# Patient Record
Sex: Male | Born: 1955 | Race: White | Hispanic: No | Marital: Single | Smoking: Current every day smoker
Health system: Southern US, Community
[De-identification: ages and names within clinical notes are randomized; demographics above are authoritative.]

## PROBLEM LIST (undated history)

## (undated) DIAGNOSIS — E039 Hypothyroidism, unspecified: Secondary | ICD-10-CM

## (undated) DIAGNOSIS — M797 Fibromyalgia: Secondary | ICD-10-CM

## (undated) DIAGNOSIS — K219 Gastro-esophageal reflux disease without esophagitis: Secondary | ICD-10-CM

## (undated) DIAGNOSIS — I10 Essential (primary) hypertension: Secondary | ICD-10-CM

## (undated) DIAGNOSIS — K859 Acute pancreatitis without necrosis or infection, unspecified: Secondary | ICD-10-CM

## (undated) DIAGNOSIS — J449 Chronic obstructive pulmonary disease, unspecified: Secondary | ICD-10-CM

## (undated) DIAGNOSIS — I878 Other specified disorders of veins: Secondary | ICD-10-CM

## (undated) DIAGNOSIS — M1990 Unspecified osteoarthritis, unspecified site: Secondary | ICD-10-CM

## (undated) DIAGNOSIS — I480 Paroxysmal atrial fibrillation: Secondary | ICD-10-CM

## (undated) DIAGNOSIS — N182 Chronic kidney disease, stage 2 (mild): Secondary | ICD-10-CM

## (undated) DIAGNOSIS — C439 Malignant melanoma of skin, unspecified: Secondary | ICD-10-CM

## (undated) DIAGNOSIS — F319 Bipolar disorder, unspecified: Secondary | ICD-10-CM

## (undated) DIAGNOSIS — F419 Anxiety disorder, unspecified: Secondary | ICD-10-CM

## (undated) DIAGNOSIS — J45909 Unspecified asthma, uncomplicated: Secondary | ICD-10-CM

## (undated) DIAGNOSIS — E785 Hyperlipidemia, unspecified: Secondary | ICD-10-CM

## (undated) DIAGNOSIS — B1920 Unspecified viral hepatitis C without hepatic coma: Secondary | ICD-10-CM

## (undated) DIAGNOSIS — G8929 Other chronic pain: Secondary | ICD-10-CM

## (undated) DIAGNOSIS — M069 Rheumatoid arthritis, unspecified: Secondary | ICD-10-CM

## (undated) HISTORY — PX: SKIN CANCER EXCISION: SHX779

## (undated) HISTORY — PX: INGUINAL HERNIA REPAIR: SUR1180

## (undated) HISTORY — DX: Essential (primary) hypertension: I10

## (undated) HISTORY — PX: KNEE SURGERY: SHX244

## (undated) HISTORY — DX: Hyperlipidemia, unspecified: E78.5

## (undated) HISTORY — PX: HERNIA REPAIR: SHX51

## (undated) HISTORY — DX: Paroxysmal atrial fibrillation: I48.0

## (undated) HISTORY — DX: Hypothyroidism, unspecified: E03.9

## (undated) HISTORY — DX: Unspecified viral hepatitis C without hepatic coma: B19.20

## (undated) HISTORY — DX: Chronic obstructive pulmonary disease, unspecified: J44.9

## (undated) HISTORY — DX: Rheumatoid arthritis, unspecified: M06.9

## (undated) HISTORY — PX: THYROIDECTOMY: SHX17

## (undated) HISTORY — PX: FRACTURE SURGERY: SHX138

## (undated) HISTORY — DX: Unspecified osteoarthritis, unspecified site: M19.90

## (undated) HISTORY — PX: TONSILLECTOMY: SUR1361

## (undated) HISTORY — DX: Other specified disorders of veins: I87.8

## (undated) HISTORY — DX: Other chronic pain: G89.29

## (undated) HISTORY — DX: Malignant melanoma of skin, unspecified: C43.9

---

## 1998-06-22 ENCOUNTER — Emergency Department (HOSPITAL_COMMUNITY): Admission: EM | Admit: 1998-06-22 | Discharge: 1998-06-23 | Payer: Self-pay | Admitting: Emergency Medicine

## 1998-09-30 ENCOUNTER — Emergency Department (HOSPITAL_COMMUNITY): Admission: EM | Admit: 1998-09-30 | Discharge: 1998-09-30 | Payer: Self-pay | Admitting: Internal Medicine

## 1998-10-25 ENCOUNTER — Emergency Department (HOSPITAL_COMMUNITY): Admission: EM | Admit: 1998-10-25 | Discharge: 1998-10-25 | Payer: Self-pay | Admitting: Emergency Medicine

## 1998-10-25 ENCOUNTER — Encounter: Payer: Self-pay | Admitting: Emergency Medicine

## 1998-11-12 ENCOUNTER — Ambulatory Visit (HOSPITAL_BASED_OUTPATIENT_CLINIC_OR_DEPARTMENT_OTHER): Admission: RE | Admit: 1998-11-12 | Discharge: 1998-11-12 | Payer: Self-pay | Admitting: Orthopedic Surgery

## 2001-08-08 ENCOUNTER — Emergency Department (HOSPITAL_COMMUNITY): Admission: EM | Admit: 2001-08-08 | Discharge: 2001-08-08 | Payer: Self-pay | Admitting: *Deleted

## 2004-08-28 ENCOUNTER — Emergency Department (HOSPITAL_COMMUNITY): Admission: EM | Admit: 2004-08-28 | Discharge: 2004-08-28 | Payer: Self-pay | Admitting: Emergency Medicine

## 2004-08-31 ENCOUNTER — Ambulatory Visit: Payer: Self-pay | Admitting: Internal Medicine

## 2004-09-01 ENCOUNTER — Ambulatory Visit: Payer: Self-pay | Admitting: *Deleted

## 2004-10-01 ENCOUNTER — Ambulatory Visit: Payer: Self-pay | Admitting: Internal Medicine

## 2004-11-08 ENCOUNTER — Emergency Department (HOSPITAL_COMMUNITY): Admission: EM | Admit: 2004-11-08 | Discharge: 2004-11-08 | Payer: Self-pay | Admitting: *Deleted

## 2005-02-07 ENCOUNTER — Ambulatory Visit: Payer: Self-pay | Admitting: Internal Medicine

## 2005-03-05 ENCOUNTER — Emergency Department (HOSPITAL_COMMUNITY): Admission: EM | Admit: 2005-03-05 | Discharge: 2005-03-05 | Payer: Self-pay | Admitting: Emergency Medicine

## 2005-03-14 ENCOUNTER — Ambulatory Visit: Payer: Self-pay | Admitting: Internal Medicine

## 2006-10-08 ENCOUNTER — Emergency Department (HOSPITAL_COMMUNITY): Admission: EM | Admit: 2006-10-08 | Discharge: 2006-10-08 | Payer: Self-pay | Admitting: Emergency Medicine

## 2006-12-15 ENCOUNTER — Ambulatory Visit: Payer: Self-pay | Admitting: Internal Medicine

## 2006-12-19 ENCOUNTER — Ambulatory Visit: Payer: Self-pay | Admitting: Internal Medicine

## 2006-12-22 ENCOUNTER — Ambulatory Visit: Payer: Self-pay | Admitting: Internal Medicine

## 2006-12-28 ENCOUNTER — Ambulatory Visit: Payer: Self-pay | Admitting: Internal Medicine

## 2006-12-31 ENCOUNTER — Ambulatory Visit: Payer: Self-pay | Admitting: Internal Medicine

## 2006-12-31 ENCOUNTER — Inpatient Hospital Stay (HOSPITAL_COMMUNITY): Admission: EM | Admit: 2006-12-31 | Discharge: 2007-01-01 | Payer: Self-pay | Admitting: Emergency Medicine

## 2007-01-01 ENCOUNTER — Encounter (INDEPENDENT_AMBULATORY_CARE_PROVIDER_SITE_OTHER): Payer: Self-pay | Admitting: Internal Medicine

## 2007-01-09 ENCOUNTER — Ambulatory Visit: Payer: Self-pay | Admitting: Internal Medicine

## 2007-01-16 ENCOUNTER — Ambulatory Visit: Payer: Self-pay | Admitting: Internal Medicine

## 2007-01-17 ENCOUNTER — Ambulatory Visit (HOSPITAL_COMMUNITY): Admission: RE | Admit: 2007-01-17 | Discharge: 2007-01-17 | Payer: Self-pay | Admitting: Internal Medicine

## 2007-01-17 ENCOUNTER — Emergency Department (HOSPITAL_COMMUNITY): Admission: EM | Admit: 2007-01-17 | Discharge: 2007-01-17 | Payer: Self-pay | Admitting: Emergency Medicine

## 2007-01-18 ENCOUNTER — Encounter (HOSPITAL_COMMUNITY): Admission: RE | Admit: 2007-01-18 | Discharge: 2007-01-21 | Payer: Self-pay | Admitting: Interventional Cardiology

## 2007-01-26 ENCOUNTER — Ambulatory Visit (HOSPITAL_COMMUNITY): Admission: RE | Admit: 2007-01-26 | Discharge: 2007-01-26 | Payer: Self-pay | Admitting: Orthopedic Surgery

## 2007-02-01 ENCOUNTER — Ambulatory Visit: Payer: Self-pay | Admitting: Internal Medicine

## 2007-02-05 ENCOUNTER — Ambulatory Visit: Payer: Self-pay | Admitting: Internal Medicine

## 2007-02-06 ENCOUNTER — Ambulatory Visit: Payer: Self-pay | Admitting: Vascular Surgery

## 2007-02-06 ENCOUNTER — Emergency Department (HOSPITAL_COMMUNITY): Admission: EM | Admit: 2007-02-06 | Discharge: 2007-02-06 | Payer: Self-pay | Admitting: Emergency Medicine

## 2007-02-22 ENCOUNTER — Ambulatory Visit: Payer: Self-pay | Admitting: Internal Medicine

## 2007-03-02 ENCOUNTER — Emergency Department (HOSPITAL_COMMUNITY): Admission: EM | Admit: 2007-03-02 | Discharge: 2007-03-02 | Payer: Self-pay | Admitting: Emergency Medicine

## 2007-03-14 ENCOUNTER — Encounter (INDEPENDENT_AMBULATORY_CARE_PROVIDER_SITE_OTHER): Payer: Self-pay | Admitting: *Deleted

## 2007-05-18 ENCOUNTER — Ambulatory Visit: Payer: Self-pay | Admitting: Internal Medicine

## 2007-06-07 ENCOUNTER — Ambulatory Visit: Payer: Self-pay | Admitting: Internal Medicine

## 2007-06-15 ENCOUNTER — Ambulatory Visit (HOSPITAL_COMMUNITY): Admission: RE | Admit: 2007-06-15 | Discharge: 2007-06-15 | Payer: Self-pay | Admitting: Internal Medicine

## 2007-07-19 ENCOUNTER — Ambulatory Visit: Payer: Self-pay | Admitting: Internal Medicine

## 2007-07-19 LAB — CONVERTED CEMR LAB
Amphetamine Screen, Ur: NEGATIVE
Benzodiazepines.: NEGATIVE
Cocaine Metabolites: NEGATIVE
Creatinine,U: 133.3 mg/dL
Marijuana Metabolite: NEGATIVE
Methadone: NEGATIVE
Opiate Screen, Urine: NEGATIVE
TSH: 0.974 microintl units/mL (ref 0.350–5.50)

## 2007-09-20 ENCOUNTER — Ambulatory Visit: Payer: Self-pay | Admitting: Internal Medicine

## 2007-09-20 LAB — CONVERTED CEMR LAB
Benzodiazepines.: NEGATIVE
Marijuana Metabolite: NEGATIVE
Phencyclidine (PCP): NEGATIVE

## 2008-12-17 ENCOUNTER — Encounter: Payer: Self-pay | Admitting: Internal Medicine

## 2008-12-17 ENCOUNTER — Emergency Department (HOSPITAL_COMMUNITY): Admission: EM | Admit: 2008-12-17 | Discharge: 2008-12-17 | Payer: Self-pay | Admitting: Emergency Medicine

## 2009-01-19 ENCOUNTER — Emergency Department (HOSPITAL_COMMUNITY): Admission: EM | Admit: 2009-01-19 | Discharge: 2009-01-19 | Payer: Self-pay | Admitting: Emergency Medicine

## 2009-05-15 ENCOUNTER — Encounter: Admission: RE | Admit: 2009-05-15 | Discharge: 2009-05-15 | Payer: Self-pay | Admitting: Internal Medicine

## 2009-11-08 ENCOUNTER — Emergency Department (HOSPITAL_COMMUNITY): Admission: EM | Admit: 2009-11-08 | Discharge: 2009-11-08 | Payer: Self-pay | Admitting: Emergency Medicine

## 2009-12-12 ENCOUNTER — Emergency Department (HOSPITAL_COMMUNITY): Admission: EM | Admit: 2009-12-12 | Discharge: 2009-12-13 | Payer: Self-pay | Admitting: Emergency Medicine

## 2010-01-07 ENCOUNTER — Emergency Department (HOSPITAL_COMMUNITY): Admission: EM | Admit: 2010-01-07 | Discharge: 2010-01-07 | Payer: Self-pay | Admitting: Emergency Medicine

## 2010-01-23 ENCOUNTER — Emergency Department (HOSPITAL_COMMUNITY): Admission: EM | Admit: 2010-01-23 | Discharge: 2010-01-23 | Payer: Self-pay | Admitting: Emergency Medicine

## 2010-02-18 ENCOUNTER — Observation Stay (HOSPITAL_COMMUNITY): Admission: EM | Admit: 2010-02-18 | Discharge: 2010-02-19 | Payer: Self-pay | Admitting: Emergency Medicine

## 2010-02-18 ENCOUNTER — Ambulatory Visit: Payer: Self-pay | Admitting: Family Medicine

## 2010-02-18 ENCOUNTER — Ambulatory Visit: Payer: Self-pay | Admitting: Internal Medicine

## 2010-02-19 ENCOUNTER — Encounter: Payer: Self-pay | Admitting: Family Medicine

## 2010-02-19 HISTORY — PX: US ECHOCARDIOGRAPHY: HXRAD669

## 2010-02-22 ENCOUNTER — Ambulatory Visit: Payer: Self-pay | Admitting: Cardiovascular Disease

## 2010-03-03 ENCOUNTER — Ambulatory Visit: Payer: Self-pay | Admitting: Cardiovascular Disease

## 2010-03-10 ENCOUNTER — Ambulatory Visit: Payer: Self-pay | Admitting: Cardiovascular Disease

## 2010-03-18 ENCOUNTER — Ambulatory Visit: Payer: Self-pay | Admitting: Cardiology

## 2010-03-26 ENCOUNTER — Ambulatory Visit: Payer: Self-pay | Admitting: Cardiovascular Disease

## 2010-04-02 ENCOUNTER — Ambulatory Visit: Payer: Self-pay | Admitting: Cardiovascular Disease

## 2010-04-08 ENCOUNTER — Ambulatory Visit: Payer: Self-pay | Admitting: Cardiology

## 2010-04-13 ENCOUNTER — Encounter: Admission: RE | Admit: 2010-04-13 | Discharge: 2010-04-13 | Payer: Self-pay | Admitting: Family Medicine

## 2010-04-17 ENCOUNTER — Ambulatory Visit: Payer: Self-pay | Admitting: Vascular Surgery

## 2010-04-17 ENCOUNTER — Emergency Department (HOSPITAL_COMMUNITY): Admission: EM | Admit: 2010-04-17 | Discharge: 2010-04-17 | Payer: Self-pay | Admitting: Emergency Medicine

## 2010-04-19 ENCOUNTER — Ambulatory Visit: Payer: Self-pay | Admitting: Cardiology

## 2010-04-26 ENCOUNTER — Ambulatory Visit: Payer: Self-pay | Admitting: Cardiology

## 2010-05-03 ENCOUNTER — Ambulatory Visit: Payer: Self-pay | Admitting: Cardiovascular Disease

## 2010-05-25 ENCOUNTER — Ambulatory Visit: Payer: Self-pay | Admitting: Cardiology

## 2010-06-22 ENCOUNTER — Ambulatory Visit: Payer: Self-pay | Admitting: Cardiovascular Disease

## 2010-07-15 ENCOUNTER — Ambulatory Visit: Payer: Self-pay | Admitting: Cardiovascular Disease

## 2010-07-18 ENCOUNTER — Encounter: Payer: Self-pay | Admitting: Rheumatology

## 2010-07-18 ENCOUNTER — Encounter: Payer: Self-pay | Admitting: Interventional Cardiology

## 2010-07-21 ENCOUNTER — Emergency Department (HOSPITAL_COMMUNITY)
Admission: EM | Admit: 2010-07-21 | Discharge: 2010-07-21 | Payer: Self-pay | Source: Home / Self Care | Admitting: Emergency Medicine

## 2010-08-13 ENCOUNTER — Emergency Department (HOSPITAL_COMMUNITY)
Admission: EM | Admit: 2010-08-13 | Discharge: 2010-08-13 | Disposition: A | Discharge status: Home / Self Care | Payer: Medicaid Other | Attending: Emergency Medicine | Admitting: Emergency Medicine

## 2010-08-13 DIAGNOSIS — IMO0001 Reserved for inherently not codable concepts without codable children: Secondary | ICD-10-CM | POA: Insufficient documentation

## 2010-08-13 DIAGNOSIS — Z79899 Other long term (current) drug therapy: Secondary | ICD-10-CM | POA: Insufficient documentation

## 2010-08-13 DIAGNOSIS — I872 Venous insufficiency (chronic) (peripheral): Secondary | ICD-10-CM | POA: Insufficient documentation

## 2010-08-13 DIAGNOSIS — E039 Hypothyroidism, unspecified: Secondary | ICD-10-CM | POA: Insufficient documentation

## 2010-08-13 DIAGNOSIS — E785 Hyperlipidemia, unspecified: Secondary | ICD-10-CM | POA: Insufficient documentation

## 2010-08-13 DIAGNOSIS — M129 Arthropathy, unspecified: Secondary | ICD-10-CM | POA: Insufficient documentation

## 2010-08-13 DIAGNOSIS — M069 Rheumatoid arthritis, unspecified: Secondary | ICD-10-CM | POA: Insufficient documentation

## 2010-08-13 DIAGNOSIS — Z8619 Personal history of other infectious and parasitic diseases: Secondary | ICD-10-CM | POA: Insufficient documentation

## 2010-08-13 DIAGNOSIS — I4891 Unspecified atrial fibrillation: Secondary | ICD-10-CM | POA: Insufficient documentation

## 2010-08-13 DIAGNOSIS — J45909 Unspecified asthma, uncomplicated: Secondary | ICD-10-CM | POA: Insufficient documentation

## 2010-08-13 DIAGNOSIS — E119 Type 2 diabetes mellitus without complications: Secondary | ICD-10-CM | POA: Insufficient documentation

## 2010-08-13 LAB — CBC
MCH: 30.9 pg (ref 26.0–34.0)
MCV: 90.4 fL (ref 78.0–100.0)
RBC: 4.47 MIL/uL (ref 4.22–5.81)
WBC: 7.3 10*3/uL (ref 4.0–10.5)

## 2010-08-13 LAB — DIFFERENTIAL
Basophils Absolute: 0 10*3/uL (ref 0.0–0.1)
Basophils Relative: 0 % (ref 0–1)
Eosinophils Absolute: 0.4 10*3/uL (ref 0.0–0.7)
Lymphocytes Relative: 37 % (ref 12–46)
Monocytes Relative: 12 % (ref 3–12)
Neutro Abs: 3.4 10*3/uL (ref 1.7–7.7)
Neutrophils Relative %: 46 % (ref 43–77)

## 2010-08-13 LAB — BASIC METABOLIC PANEL
BUN: 21 mg/dL (ref 6–23)
Calcium: 8.8 mg/dL (ref 8.4–10.5)
GFR calc non Af Amer: >60 mL/min (ref >60)
Sodium: 138 mEq/L (ref 135–145)

## 2010-09-08 LAB — CBC
MCV: 89.8 fL (ref 78.0–100.0)
Platelets: 168 10*3/uL (ref 150–400)
RBC: 4.43 MIL/uL (ref 4.22–5.81)
RDW: 14 % (ref 11.5–15.5)
WBC: 8.8 10*3/uL (ref 4.0–10.5)

## 2010-09-08 LAB — POCT I-STAT, CHEM 8
Creatinine, Ser: 1.1 mg/dL (ref 0.4–1.5)
HCT: 42 % (ref 39.0–52.0)
Hemoglobin: 14.3 g/dL (ref 13.0–17.0)
Potassium: 4.1 mEq/L (ref 3.5–5.1)
Sodium: 138 mEq/L (ref 135–145)
TCO2: 26 mmol/L (ref 0–100)

## 2010-09-08 LAB — APTT: aPTT: 36 seconds (ref 24–37)

## 2010-09-08 LAB — PROTIME-INR
INR: 1.95 — ABNORMAL HIGH (ref 0.00–1.49)
Prothrombin Time: 22.4 seconds — ABNORMAL HIGH (ref 11.6–15.2)

## 2010-09-08 LAB — DIFFERENTIAL
Basophils Absolute: 0 10*3/uL (ref 0.0–0.1)
Lymphocytes Relative: 37 % (ref 12–46)
Lymphs Abs: 3.2 10*3/uL (ref 0.7–4.0)
Neutro Abs: 4.1 10*3/uL (ref 1.7–7.7)
Neutrophils Relative %: 46 % (ref 43–77)

## 2010-09-10 LAB — GLUCOSE, CAPILLARY
Glucose-Capillary: 122 mg/dL — ABNORMAL HIGH (ref 70–99)
Glucose-Capillary: 149 mg/dL — ABNORMAL HIGH (ref 70–99)

## 2010-09-10 LAB — DIFFERENTIAL
Basophils Absolute: 0 10*3/uL (ref 0.0–0.1)
Basophils Relative: 0 % (ref 0–1)
Lymphocytes Relative: 50 % — ABNORMAL HIGH (ref 12–46)
Monocytes Relative: 8 % (ref 3–12)
Neutro Abs: 2.9 10*3/uL (ref 1.7–7.7)
Neutrophils Relative %: 37 % — ABNORMAL LOW (ref 43–77)

## 2010-09-10 LAB — BASIC METABOLIC PANEL
CO2: 25 mEq/L (ref 19–32)
Calcium: 9.2 mg/dL (ref 8.4–10.5)
Chloride: 109 mEq/L (ref 96–112)
GFR calc Af Amer: >60 mL/min (ref >60)
GFR calc non Af Amer: >60 mL/min (ref >60)
GFR calc non Af Amer: >60 mL/min (ref >60)
Glucose, Bld: 107 mg/dL — ABNORMAL HIGH (ref 70–99)
Glucose, Bld: 157 mg/dL — ABNORMAL HIGH (ref 70–99)
Potassium: 3.8 mEq/L (ref 3.5–5.1)
Potassium: 3.9 mEq/L (ref 3.5–5.1)
Sodium: 139 mEq/L (ref 135–145)
Sodium: 140 mEq/L (ref 135–145)

## 2010-09-10 LAB — LIPID PANEL
HDL: 28 mg/dL — ABNORMAL LOW (ref >39)
Total CHOL/HDL Ratio: 4.1 RATIO
Triglycerides: 266 mg/dL — ABNORMAL HIGH (ref <150)
VLDL: 53 mg/dL — ABNORMAL HIGH (ref 0–40)

## 2010-09-10 LAB — CBC
Hemoglobin: 15.6 g/dL (ref 13.0–17.0)
MCH: 30.1 pg (ref 26.0–34.0)
MCHC: 34.2 g/dL (ref 30.0–36.0)
MCHC: 35.4 g/dL (ref 30.0–36.0)
MCV: 88 fL (ref 78.0–100.0)
Platelets: 166 10*3/uL (ref 150–400)
RDW: 14.3 % (ref 11.5–15.5)
RDW: 14.3 % (ref 11.5–15.5)
WBC: 7.8 10*3/uL (ref 4.0–10.5)

## 2010-09-10 LAB — POCT CARDIAC MARKERS
CKMB, poc: <1 ng/mL — ABNORMAL LOW (ref 1.0–8.0)
Myoglobin, poc: 40.9 ng/mL (ref 12–200)
Troponin i, poc: <0.05 ng/mL (ref 0.00–0.09)

## 2010-09-10 LAB — TROPONIN I: Troponin I: 0.01 ng/mL (ref 0.00–0.06)

## 2010-09-10 LAB — CARDIAC PANEL(CRET KIN+CKTOT+MB+TROPI)
Relative Index: INVALID (ref 0.0–2.5)
Troponin I: 0.03 ng/mL (ref 0.00–0.06)

## 2010-09-10 LAB — CK TOTAL AND CKMB (NOT AT ARMC)
CK, MB: 1.3 ng/mL (ref 0.3–4.0)
Relative Index: INVALID (ref 0.0–2.5)

## 2010-09-10 LAB — RAPID URINE DRUG SCREEN, HOSP PERFORMED: Tetrahydrocannabinol: NOT DETECTED

## 2010-09-11 LAB — CBC
Hemoglobin: 14.2 g/dL (ref 13.0–17.0)
MCH: 31.6 pg (ref 26.0–34.0)
MCV: 91 fL (ref 78.0–100.0)
Platelets: 186 10*3/uL (ref 150–400)
RBC: 4.5 MIL/uL (ref 4.22–5.81)

## 2010-09-11 LAB — DIFFERENTIAL
Eosinophils Absolute: 0.4 10*3/uL (ref 0.0–0.7)
Eosinophils Relative: 4 % (ref 0–5)
Lymphs Abs: 3.3 10*3/uL (ref 0.7–4.0)
Monocytes Relative: 8 % (ref 3–12)

## 2010-09-11 LAB — POCT I-STAT, CHEM 8
BUN: 15 mg/dL (ref 6–23)
Calcium, Ion: 1.12 mmol/L (ref 1.12–1.32)
Creatinine, Ser: 1.3 mg/dL (ref 0.4–1.5)
Glucose, Bld: 103 mg/dL — ABNORMAL HIGH (ref 70–99)
Hemoglobin: 14.6 g/dL (ref 13.0–17.0)
TCO2: 25 mmol/L (ref 0–100)

## 2010-09-13 LAB — DIFFERENTIAL
Basophils Relative: 0 % (ref 0–1)
Eosinophils Absolute: 0.2 10*3/uL (ref 0.0–0.7)
Monocytes Relative: 8 % (ref 3–12)
Neutro Abs: 6.7 10*3/uL (ref 1.7–7.7)
Neutrophils Relative %: 66 % (ref 43–77)

## 2010-09-13 LAB — COMPREHENSIVE METABOLIC PANEL
ALT: 21 U/L (ref 0–53)
Alkaline Phosphatase: 50 U/L (ref 39–117)
BUN: 12 mg/dL (ref 6–23)
CO2: 28 mEq/L (ref 19–32)
Calcium: 9.1 mg/dL (ref 8.4–10.5)
GFR calc non Af Amer: >60 mL/min (ref >60)
Glucose, Bld: 164 mg/dL — ABNORMAL HIGH (ref 70–99)
Potassium: 4.8 mEq/L (ref 3.5–5.1)
Total Protein: 7.1 g/dL (ref 6.0–8.3)

## 2010-09-13 LAB — CBC
HCT: 42.3 % (ref 39.0–52.0)
Hemoglobin: 14.5 g/dL (ref 13.0–17.0)
MCHC: 34.4 g/dL (ref 30.0–36.0)
RBC: 4.68 MIL/uL (ref 4.22–5.81)
RDW: 13.8 % (ref 11.5–15.5)

## 2010-09-22 ENCOUNTER — Other Ambulatory Visit: Payer: Self-pay | Admitting: *Deleted

## 2010-09-22 DIAGNOSIS — I4891 Unspecified atrial fibrillation: Secondary | ICD-10-CM

## 2010-09-22 MED ORDER — WARFARIN SODIUM 5 MG PO TABS: ORAL_TABLET | ORAL | Status: DC

## 2010-09-22 NOTE — Telephone Encounter (Signed)
Refill per phar. fax
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
## 2011-01-27 ENCOUNTER — Ambulatory Visit: Payer: Medicaid Other | Admitting: Cardiovascular Disease

## 2011-01-31 ENCOUNTER — Telehealth: Payer: Self-pay | Admitting: Cardiovascular Disease

## 2011-01-31 NOTE — Telephone Encounter (Signed)
Morrie Sheldon called from Dr Ricci Barker office She said that the info did not get to his transportation and to him to make appt last Thursday and she wants to reschedule please call

## 2011-02-16 ENCOUNTER — Ambulatory Visit: Payer: Medicaid Other | Admitting: Cardiovascular Disease

## 2011-03-31 ENCOUNTER — Encounter: Payer: Self-pay | Admitting: Cardiovascular Disease

## 2011-03-31 ENCOUNTER — Ambulatory Visit: Payer: Medicaid Other | Admitting: Cardiovascular Disease

## 2011-04-05 ENCOUNTER — Encounter: Payer: Self-pay | Admitting: Cardiovascular Disease

## 2011-04-11 LAB — COMPREHENSIVE METABOLIC PANEL
ALT: 20
AST: 16
Alkaline Phosphatase: 53
CO2: 24
Calcium: 9.4
Chloride: 106
GFR calc Af Amer: >60
GFR calc non Af Amer: >60
Glucose, Bld: 138 — ABNORMAL HIGH
Potassium: 3.5
Sodium: 135
Total Bilirubin: 0.7

## 2011-04-11 LAB — CBC
Hemoglobin: 14.4
MCHC: 35.1
RBC: 4.78
WBC: 9.2

## 2011-04-11 LAB — DIFFERENTIAL
Basophils Absolute: 0
Basophils Relative: 0
Eosinophils Absolute: 0.2
Eosinophils Relative: 3
Lymphs Abs: 3
Neutrophils Relative %: 53

## 2011-04-12 LAB — MAGNESIUM
Magnesium: 1.9
Magnesium: 1.9

## 2011-04-12 LAB — CARDIAC PANEL(CRET KIN+CKTOT+MB+TROPI)
CK, MB: 1.3
CK, MB: 1.6
Relative Index: INVALID
Total CK: 70
Total CK: 84
Troponin I: 0.01

## 2011-04-12 LAB — URINE CULTURE

## 2011-04-12 LAB — I-STAT 8, (EC8 V) (CONVERTED LAB)
BUN: 15
Bicarbonate: 23.1
Chloride: 105
Glucose, Bld: 149 — ABNORMAL HIGH
HCT: 40
Hemoglobin: 13.6
Operator id: 257131
pCO2, Ven: 38.9 — ABNORMAL LOW

## 2011-04-12 LAB — CULTURE, BLOOD (ROUTINE X 2): Culture: NO GROWTH

## 2011-04-12 LAB — DIFFERENTIAL
Basophils Absolute: 0
Basophils Relative: 0
Lymphocytes Relative: 39
Neutro Abs: 4.8
Neutrophils Relative %: 53

## 2011-04-12 LAB — BASIC METABOLIC PANEL
BUN: 12
CO2: 24
Calcium: 8.6
Calcium: 8.6
Creatinine, Ser: 0.77
GFR calc Af Amer: >60
GFR calc non Af Amer: >60
GFR calc non Af Amer: >60
Glucose, Bld: 268 — ABNORMAL HIGH
Sodium: 136
Sodium: 139

## 2011-04-12 LAB — CBC
HCT: 37.7 — ABNORMAL LOW
HCT: 41.1
Hemoglobin: 12.9 — ABNORMAL LOW
Hemoglobin: 13
Hemoglobin: 13.9
MCHC: 33.8
Platelets: 210
RBC: 4.3
RBC: 4.32
RDW: 13.6
WBC: 7.8

## 2011-04-12 LAB — PROTIME-INR
INR: 0.9
Prothrombin Time: 12.7

## 2011-04-12 LAB — LIPID PANEL
Cholesterol: 165
HDL: 29 — ABNORMAL LOW
Total CHOL/HDL Ratio: 5.7
VLDL: 26

## 2011-04-12 LAB — URINALYSIS, ROUTINE W REFLEX MICROSCOPIC
Glucose, UA: NEGATIVE
Hgb urine dipstick: NEGATIVE
Leukocytes, UA: NEGATIVE
pH: 6

## 2011-04-12 LAB — RAPID URINE DRUG SCREEN, HOSP PERFORMED
Benzodiazepines: NOT DETECTED
Cocaine: NOT DETECTED
Opiates: NOT DETECTED

## 2011-04-12 LAB — POCT I-STAT CREATININE
Creatinine, Ser: 0.7
Operator id: 257131

## 2011-04-12 LAB — COMPREHENSIVE METABOLIC PANEL
Albumin: 3.7
Alkaline Phosphatase: 64
BUN: 13
CO2: 27
Chloride: 103
Creatinine, Ser: 0.73
GFR calc non Af Amer: >60
Glucose, Bld: 128 — ABNORMAL HIGH
Potassium: 4
Total Bilirubin: 0.7

## 2011-04-12 LAB — ETHANOL: Alcohol, Ethyl (B): <5

## 2011-04-12 LAB — POCT CARDIAC MARKERS
CKMB, poc: <1 — ABNORMAL LOW
Myoglobin, poc: 38.4
Operator id: 151321
Operator id: 257131

## 2011-04-12 LAB — URINE MICROSCOPIC-ADD ON

## 2011-04-12 LAB — HEMOGLOBIN A1C: Hgb A1c MFr Bld: 9.8 — ABNORMAL HIGH

## 2011-04-12 LAB — CK TOTAL AND CKMB (NOT AT ARMC): Relative Index: 1.9

## 2011-04-16 ENCOUNTER — Other Ambulatory Visit: Payer: Self-pay | Admitting: Cardiology

## 2011-04-18 ENCOUNTER — Other Ambulatory Visit: Payer: Self-pay

## 2011-04-19 ENCOUNTER — Other Ambulatory Visit: Payer: Self-pay | Admitting: Cardiology

## 2011-05-11 ENCOUNTER — Other Ambulatory Visit: Payer: Self-pay | Admitting: Family Medicine

## 2011-05-11 ENCOUNTER — Ambulatory Visit
Admission: RE | Admit: 2011-05-11 | Discharge: 2011-05-11 | Disposition: A | Discharge status: Home / Self Care | Payer: Medicaid Other | Source: Ambulatory Visit | Attending: Family Medicine | Admitting: Family Medicine

## 2011-05-11 DIAGNOSIS — M25531 Pain in right wrist: Secondary | ICD-10-CM

## 2011-08-04 ENCOUNTER — Encounter: Payer: Medicaid Other | Attending: Physical Medicine & Rehabilitation

## 2011-08-04 ENCOUNTER — Ambulatory Visit: Payer: Medicaid Other | Admitting: Physical Medicine & Rehabilitation

## 2011-08-12 ENCOUNTER — Ambulatory Visit: Payer: Medicaid Other | Admitting: Physical Medicine & Rehabilitation

## 2011-08-15 ENCOUNTER — Other Ambulatory Visit: Payer: Self-pay | Admitting: *Deleted

## 2011-09-28 ENCOUNTER — Ambulatory Visit: Payer: Medicaid Other | Admitting: Nurse Practitioner

## 2012-05-01 ENCOUNTER — Emergency Department (HOSPITAL_COMMUNITY): Payer: Medicaid Other

## 2012-05-01 ENCOUNTER — Encounter (HOSPITAL_COMMUNITY): Payer: Self-pay | Admitting: *Deleted

## 2012-05-01 ENCOUNTER — Emergency Department (HOSPITAL_COMMUNITY)
Admission: EM | Admit: 2012-05-01 | Discharge: 2012-05-01 | Disposition: A | Discharge status: Home / Self Care | Payer: Medicaid Other | Attending: Emergency Medicine | Admitting: Emergency Medicine

## 2012-05-01 DIAGNOSIS — Z8679 Personal history of other diseases of the circulatory system: Secondary | ICD-10-CM | POA: Insufficient documentation

## 2012-05-01 DIAGNOSIS — J449 Chronic obstructive pulmonary disease, unspecified: Secondary | ICD-10-CM | POA: Insufficient documentation

## 2012-05-01 DIAGNOSIS — E119 Type 2 diabetes mellitus without complications: Secondary | ICD-10-CM | POA: Insufficient documentation

## 2012-05-01 DIAGNOSIS — J45909 Unspecified asthma, uncomplicated: Secondary | ICD-10-CM | POA: Insufficient documentation

## 2012-05-01 DIAGNOSIS — Z85828 Personal history of other malignant neoplasm of skin: Secondary | ICD-10-CM | POA: Insufficient documentation

## 2012-05-01 DIAGNOSIS — R109 Unspecified abdominal pain: Secondary | ICD-10-CM

## 2012-05-01 DIAGNOSIS — I10 Essential (primary) hypertension: Secondary | ICD-10-CM | POA: Insufficient documentation

## 2012-05-01 DIAGNOSIS — Z79899 Other long term (current) drug therapy: Secondary | ICD-10-CM | POA: Insufficient documentation

## 2012-05-01 DIAGNOSIS — J4489 Other specified chronic obstructive pulmonary disease: Secondary | ICD-10-CM | POA: Insufficient documentation

## 2012-05-01 DIAGNOSIS — F319 Bipolar disorder, unspecified: Secondary | ICD-10-CM | POA: Insufficient documentation

## 2012-05-01 DIAGNOSIS — E785 Hyperlipidemia, unspecified: Secondary | ICD-10-CM | POA: Insufficient documentation

## 2012-05-01 DIAGNOSIS — E039 Hypothyroidism, unspecified: Secondary | ICD-10-CM | POA: Insufficient documentation

## 2012-05-01 DIAGNOSIS — Z7901 Long term (current) use of anticoagulants: Secondary | ICD-10-CM | POA: Insufficient documentation

## 2012-05-01 DIAGNOSIS — I4891 Unspecified atrial fibrillation: Secondary | ICD-10-CM | POA: Insufficient documentation

## 2012-05-01 DIAGNOSIS — F17200 Nicotine dependence, unspecified, uncomplicated: Secondary | ICD-10-CM | POA: Insufficient documentation

## 2012-05-01 DIAGNOSIS — G8929 Other chronic pain: Secondary | ICD-10-CM | POA: Insufficient documentation

## 2012-05-01 DIAGNOSIS — R1013 Epigastric pain: Secondary | ICD-10-CM | POA: Insufficient documentation

## 2012-05-01 DIAGNOSIS — B1920 Unspecified viral hepatitis C without hepatic coma: Secondary | ICD-10-CM | POA: Insufficient documentation

## 2012-05-01 HISTORY — DX: Bipolar disorder, unspecified: F31.9

## 2012-05-01 LAB — COMPREHENSIVE METABOLIC PANEL
AST: 15 U/L (ref 0–37)
Alkaline Phosphatase: 57 U/L (ref 39–117)
CO2: 23 mEq/L (ref 19–32)
Chloride: 104 mEq/L (ref 96–112)
Creatinine, Ser: 0.89 mg/dL (ref 0.50–1.35)
GFR calc non Af Amer: >90 mL/min (ref >90)
Total Bilirubin: 0.2 mg/dL — ABNORMAL LOW (ref 0.3–1.2)

## 2012-05-01 LAB — PROTIME-INR: Prothrombin Time: 29.1 seconds — ABNORMAL HIGH (ref 11.6–15.2)

## 2012-05-01 LAB — CBC WITH DIFFERENTIAL/PLATELET
Basophils Absolute: 0 10*3/uL (ref 0.0–0.1)
HCT: 39 % (ref 39.0–52.0)
Hemoglobin: 13.2 g/dL (ref 13.0–17.0)
Lymphocytes Relative: 31 % (ref 12–46)
Monocytes Absolute: 0.8 10*3/uL (ref 0.1–1.0)
Monocytes Relative: 11 % (ref 3–12)
Neutro Abs: 4.2 10*3/uL (ref 1.7–7.7)
RDW: 14 % (ref 11.5–15.5)
WBC: 7.8 10*3/uL (ref 4.0–10.5)

## 2012-05-01 MED ORDER — ONDANSETRON HCL 4 MG/2ML IJ SOLN
4.0000 mg | Freq: Once | INTRAMUSCULAR | Status: AC
  Administered 2012-05-01: 4 mg via INTRAVENOUS
  Filled 2012-05-01: qty 2

## 2012-05-01 MED ORDER — MORPHINE SULFATE 4 MG/ML IJ SOLN
4.0000 mg | Freq: Once | INTRAMUSCULAR | Status: AC
  Administered 2012-05-01: 4 mg via INTRAVENOUS
  Filled 2012-05-01: qty 1

## 2012-05-01 MED ORDER — OXYCODONE-ACETAMINOPHEN 5-325 MG PO TABS: 1.0000 | ORAL_TABLET | ORAL | Status: DC | PRN

## 2012-05-01 MED ORDER — SODIUM CHLORIDE 0.9 % IV BOLUS (SEPSIS)
1000.0000 mL | Freq: Once | INTRAVENOUS | Status: AC
  Administered 2012-05-01: 1000 mL via INTRAVENOUS

## 2012-05-01 MED ORDER — SUCRALFATE 1 GM/10ML PO SUSP: 1.0000 g | Freq: Four times a day (QID) | ORAL | Status: DC

## 2012-05-01 MED ORDER — SUCRALFATE 1 G PO TABS
1.0000 g | ORAL_TABLET | Freq: Once | ORAL | Status: AC
  Administered 2012-05-01: 1 g via ORAL
  Filled 2012-05-01: qty 1

## 2012-05-01 MED ORDER — GI COCKTAIL ~~LOC~~
30.0000 mL | Freq: Once | ORAL | Status: AC
  Filled 2012-05-01: qty 30

## 2012-05-01 MED ORDER — PANTOPRAZOLE SODIUM 40 MG IV SOLR
40.0000 mg | Freq: Once | INTRAVENOUS | Status: AC
  Administered 2012-05-01: 40 mg via INTRAVENOUS
  Filled 2012-05-01: qty 40

## 2012-05-01 MED ORDER — ONDANSETRON HCL 4 MG PO TABS: 4.0000 mg | ORAL_TABLET | Freq: Four times a day (QID) | ORAL | Status: DC

## 2012-05-01 NOTE — ED Notes (Signed)
Per EMS:  Pt woke up about 0300 with epigastric pain, pt took an oxycodone prior to pt's arrival.  Pt became angry with EMS because he thought they were taking too long, he ripped off his EKG leads, got out of the truck and went back inside.  Pt has hx of bipolar.  Pt has no SOB, reports nausea.

## 2012-05-01 NOTE — ED Provider Notes (Signed)
History     CSN: [id number]  Arrival date & time 05/01/12  0449   First MD Initiated Contact with Patient 05/01/12 0503      No chief complaint on file.   (Consider location/radiation/quality/duration/timing/severity/associated sxs/prior treatment) HPI Pt presents with epigastric pain that woke him from sleep at 0300. No N/V, chest pain. No fever chills. No recent NSAID use. States he ate lasagne last night before bed. No blood or melena in stool.   Past Medical History  Diagnosis Date  . PAF (paroxysmal atrial fibrillation)   . Diabetes mellitus   . Hypothyroidism   . Hyperlipidemia   . Hypertension   . Chronic pain   . Melanoma   . OA (osteoarthritis)   . COPD (chronic obstructive pulmonary disease)   . Asthma   . RA (rheumatoid arthritis)   . Hepatitis C   . Venous stasis   . Bipolar 1 disorder     Past Surgical History  Procedure Date  . Hernia repair   . Thyroidectomy   . Tonsillectomy   . US echocardiography 02/19/2010    EF 55-60%    Family History  Problem Relation Age of Onset  . Hypertension Mother   . Alzheimer's disease Mother   . Heart disease Father     History  Substance Use Topics  . Smoking status: Current Every Day Smoker -- 1.0 packs/day  . Smokeless tobacco: Not on file  . Alcohol Use: No      Review of Systems  Constitutional: Negative for fever and chills.  Respiratory: Negative for shortness of breath.   Cardiovascular: Negative for chest pain and palpitations.  Gastrointestinal: Positive for abdominal pain. Negative for nausea, vomiting, diarrhea, constipation and blood in stool.  Musculoskeletal: Negative for myalgias and back pain.  Skin: Negative for rash and wound.  Neurological: Negative for dizziness, weakness, light-headedness, numbness and headaches.    Allergies  Antihistamines, chlorpheniramine-type; Iodinated diagnostic agents; Iodine; Methadone; Pentazocine lactate; and Vicodin  Home Medications   Current  Outpatient Rx  Name  Route  Sig  Dispense  Refill  . VITAMIN C PO   Oral   Take by mouth daily.           Marland Kitchen VITAMIN D 1000 UNITS PO TABS   Oral   Take 1,000 Units by mouth once a week.           Marland Kitchen DIAZEPAM 10 MG PO TABS   Oral   Take 10 mg by mouth every 6 (six) hours as needed.           . DULOXETINE HCL 60 MG PO CPEP   Oral   Take 60 mg by mouth daily.           . OMEGA-3 FATTY ACIDS 1000 MG PO CAPS   Oral   Take 1 g by mouth daily.           Marland Kitchen GLIPIZIDE 10 MG PO TABS   Oral   Take 10 mg by mouth 2 (two) times daily before a meal.           . LEVOTHYROXINE SODIUM 88 MCG PO TABS   Oral   Take 88 mcg by mouth daily.           Marland Kitchen LISINOPRIL 40 MG PO TABS   Oral   Take 40 mg by mouth 2 (two) times daily.           Marland Kitchen LORATADINE 10 MG PO TABS   Oral  Take 10 mg by mouth daily.           Marland Kitchen MECLIZINE HCL 25 MG PO TABS   Oral   Take 25 mg by mouth 3 (three) times daily as needed.           Marland Kitchen METFORMIN HCL 1000 MG PO TABS   Oral   Take 1,000 mg by mouth 2 (two) times daily with a meal.           . METOPROLOL SUCCINATE ER 50 MG PO TB24      TAKE 1 TABLET EACH DAY   15 tablet   0     NEEDS TO SCHEDULE AN OFFICE VISIT WITH DR. Elease Hashimoto  ...   . ONE-DAILY MULTI VITAMINS PO TABS   Oral   Take 1 tablet by mouth daily.           Marland Kitchen OMEPRAZOLE 40 MG PO CPDR   Oral   Take 40 mg by mouth daily.           Marland Kitchen ONDANSETRON HCL 4 MG PO TABS   Oral   Take 1 tablet (4 mg total) by mouth every 6 (six) hours.   12 tablet   0   . OXYCODONE-ACETAMINOPHEN 10-325 MG PO TABS   Oral   Take 1 tablet by mouth every 4 (four) hours as needed.           . OXYCODONE-ACETAMINOPHEN 5-325 MG PO TABS   Oral   Take 1 tablet by mouth every 4 (four) hours as needed for pain.   6 tablet   0   . SIMVASTATIN 20 MG PO TABS   Oral   Take 20 mg by mouth at bedtime.           . SUCRALFATE 1 GM/10ML PO SUSP   Oral   Take 10 mLs (1 g total) by mouth 4 (four)  times daily.   420 mL   0   . TIOTROPIUM BROMIDE MONOHYDRATE 18 MCG IN CAPS   Inhalation   Place 18 mcg into inhaler and inhale daily.           . WARFARIN SODIUM 5 MG PO TABS      Take as directed per the physician   50 tablet   11     BP 173/83  Pulse 69  Temp 98.3 F (36.8 C) (Oral)  Resp 10  SpO2 100%  Physical Exam  Nursing note and vitals reviewed. Constitutional: He is oriented to person, place, and time. He appears well-developed and well-nourished. No distress.  HENT:  Head: Normocephalic and atraumatic.  Mouth/Throat: Oropharynx is clear and moist.  Eyes: EOM are normal. Pupils are equal, round, and reactive to light.  Neck: Normal range of motion. Neck supple.  Cardiovascular: Normal rate and regular rhythm.   Pulmonary/Chest: Effort normal and breath sounds normal. No respiratory distress. He has no wheezes. He has no rales. He exhibits no tenderness.  Abdominal: Soft. Bowel sounds are normal. He exhibits no distension and no mass. There is tenderness (TTP over epigastrum. no rebound or guarding. ). There is no rebound and no guarding.  Musculoskeletal: Normal range of motion. He exhibits no edema and no tenderness.  Neurological: He is alert and oriented to person, place, and time.       Moves all ext. Sensation intact  Skin: Skin is warm and dry. No rash noted. No erythema.  Psychiatric: He has a normal mood and affect. His behavior is normal.  ED Course  Procedures (including critical care time)  Labs Reviewed  COMPREHENSIVE METABOLIC PANEL - Abnormal; Notable for the following:    Glucose, Bld 251 (*)     Albumin 3.1 (*)     Total Bilirubin 0.2 (*)     All other components within normal limits  LIPASE, BLOOD - Abnormal; Notable for the following:    Lipase 61 (*)     All other components within normal limits  CBC WITH DIFFERENTIAL  PROTIME-INR  APTT   US Abdomen Complete  05/01/2012  *RADIOLOGY REPORT*  Clinical Data:  Epigastric abdominal  pain.  ABDOMINAL ULTRASOUND COMPLETE  Comparison:  None  Findings:  Gallbladder:  The gallbladder is normal in appearance, without evidence for gallstones, gallbladder wall thickening or pericholecystic fluid.  No ultrasonographic Murphy's sign is elicited.  Common Bile Duct:  0.6 cm in diameter; within normal limits in caliber.  Liver:  Normal parenchymal echogenicity and echotexture; no focal lesions identified.  Limited Doppler evaluation demonstrates normal blood flow within the liver.  IVC:  Unremarkable in appearance.  Pancreas:  Although the pancreas is difficult to visualize in its entirety due to overlying bowel gas, no focal pancreatic abnormality is identified.  Spleen:  14.2 cm in length; mildly enlarged, with grossly normal echotexture.  Right kidney:  13.0 cm in length; normal in size, configuration and parenchymal echogenicity.  No evidence of mass or hydronephrosis.  Left kidney:  13.7 cm in length; normal in size, configuration and parenchymal echogenicity.  No evidence of mass or hydronephrosis.  Abdominal Aorta:  Normal in caliber; no aneurysm identified.  The mid to distal abdominal aorta is obscured by overlying bowel gas.  IMPRESSION:  1.  No acute abnormalities seen within the abdomen. 2.  Mild splenomegaly.   Original Report Authenticated By: Tonia Ghent, M.D.      1. Abdominal pain       Date: 05/01/2012  Rate:69  Rhythm: normal sinus rhythm  QRS Axis: normal  Intervals: PR prolonged  ST/T Wave abnormalities: normal  Conduction Disutrbances:first-degree A-V block   Narrative Interpretation:   Old EKG Reviewed: changes noted Prior EKG with afib  MDM   Pt's pain is improved. Advised to f/u with GI       Loren Racer, MD 05/01/12 [phone]

## 2012-07-16 ENCOUNTER — Ambulatory Visit (INDEPENDENT_AMBULATORY_CARE_PROVIDER_SITE_OTHER): Payer: Medicaid Other | Admitting: Cardiovascular Disease

## 2012-07-16 ENCOUNTER — Encounter: Payer: Self-pay | Admitting: Cardiovascular Disease

## 2012-07-16 VITALS — BP 110/68 | HR 93 | Resp 18 | Ht >= 80 in | Wt 250.1 lb

## 2012-07-16 DIAGNOSIS — J441 Chronic obstructive pulmonary disease with (acute) exacerbation: Secondary | ICD-10-CM | POA: Insufficient documentation

## 2012-07-16 DIAGNOSIS — I4891 Unspecified atrial fibrillation: Secondary | ICD-10-CM

## 2012-07-16 DIAGNOSIS — E119 Type 2 diabetes mellitus without complications: Secondary | ICD-10-CM

## 2012-07-16 DIAGNOSIS — E785 Hyperlipidemia, unspecified: Secondary | ICD-10-CM

## 2012-07-16 DIAGNOSIS — I10 Essential (primary) hypertension: Secondary | ICD-10-CM

## 2012-07-16 DIAGNOSIS — I480 Paroxysmal atrial fibrillation: Secondary | ICD-10-CM

## 2012-07-16 DIAGNOSIS — B1920 Unspecified viral hepatitis C without hepatic coma: Secondary | ICD-10-CM

## 2012-07-16 DIAGNOSIS — J449 Chronic obstructive pulmonary disease, unspecified: Secondary | ICD-10-CM

## 2012-07-16 DIAGNOSIS — F319 Bipolar disorder, unspecified: Secondary | ICD-10-CM | POA: Insufficient documentation

## 2012-07-16 DIAGNOSIS — B182 Chronic viral hepatitis C: Secondary | ICD-10-CM | POA: Insufficient documentation

## 2012-07-16 MED ORDER — AMLODIPINE BESYLATE 5 MG PO TABS: 5.0000 mg | ORAL_TABLET | Freq: Every day | ORAL | Status: DC

## 2012-07-16 MED ORDER — METOPROLOL SUCCINATE ER 50 MG PO TB24: 50.0000 mg | ORAL_TABLET | Freq: Every day | ORAL | Status: DC

## 2012-07-16 NOTE — Progress Notes (Signed)
Johnny Sims Date of Birth  July 24, 1955       [facility] Office [address]  [address] [place], [place]  [address]   [place], [place]  [address] [phone]     [phone]   Fax  [phone]    Fax [phone]  Problem List: 1. Paroxysmal atrial fibrillation 2. Hypothyroidism 3. Diabetes mellitus 4. Hyperlipidemia 5. Hypertension 6. Chronic pain  History of Present Illness:  Johnny Sims is a 57 yo who I have seen in the past for PAF.  His primary medical office is the [facility]  office in [place].  He is on his third doctor in the past several years. He is not very  happy about this.  He's having lots of abdominal pain. He's had a poor appetite. He complains of being very thirsty and his reflux water. Also complains of polyuria. He states that his glucose levels have been well controlled.  He has having lots of generalized fatigue. She also is having trouble swallowing and chokes when he eats his cereal in the morning.  He denies any chest pain or shortness breath. He was seen in the emergency room in November, 2013 gallbladder pain. He was noted to be in normal sinus rhythm by EKG at that time.    Current Outpatient Prescriptions on File Prior to Visit  Medication Sig Dispense Refill  . Ascorbic Acid (VITAMIN C PO) Take by mouth daily.        . cholecalciferol (VITAMIN D) 1000 UNITS tablet Take 1,000 Units by mouth once a week.        . diazepam (VALIUM) 10 MG tablet Take 10 mg by mouth every 6 (six) hours as needed.        . DULoxetine (CYMBALTA) 60 MG capsule Take 60 mg by mouth daily.        . fish oil-omega-3 fatty acids 1000 MG capsule Take 1 g by mouth daily.        Marland Kitchen levothyroxine (SYNTHROID, LEVOTHROID) 88 MCG tablet Take 88 mcg by mouth daily.        Marland Kitchen lisinopril (PRINIVIL,ZESTRIL) 40 MG tablet Take 40 mg by mouth 2 (two) times daily.        Marland Kitchen loratadine (CLARITIN) 10 MG tablet Take 10 mg by mouth daily.        .  meclizine (ANTIVERT) 25 MG tablet Take 25 mg by mouth 3 (three) times daily as needed.        . metFORMIN (GLUCOPHAGE) 1000 MG tablet Take 1,000 mg by mouth 2 (two) times daily with a meal.        . metoprolol (TOPROL-XL) 50 MG 24 hr tablet TAKE 1 TABLET EACH DAY  15 tablet  0  . Multiple Vitamin (MULTIVITAMIN) tablet Take 1 tablet by mouth daily.        Marland Kitchen omeprazole (PRILOSEC) 40 MG capsule Take 40 mg by mouth daily.        . ondansetron (ZOFRAN) 4 MG tablet Take 1 tablet (4 mg total) by mouth every 6 (six) hours.  12 tablet  0  . oxyCODONE-acetaminophen (PERCOCET) 10-325 MG per tablet Take 1 tablet by mouth every 4 (four) hours as needed.        Marland Kitchen oxyCODONE-acetaminophen (PERCOCET/ROXICET) 5-325 MG per tablet Take 1 tablet by mouth every 4 (four) hours as needed for pain.  6 tablet  0  . simvastatin (ZOCOR) 20 MG tablet Take 20 mg  by mouth at bedtime.        . sucralfate (CARAFATE) 1 GM/10ML suspension Take 10 mLs (1 g total) by mouth 4 (four) times daily.  420 mL  0  . tiotropium (SPIRIVA) 18 MCG inhalation capsule Place 18 mcg into inhaler and inhale daily.        Marland Kitchen warfarin (COUMADIN) 5 MG tablet Take as directed per the physician  50 tablet  11    Allergies  Allergen Reactions  . Antihistamines, Chlorpheniramine-Type   . Iodinated Diagnostic Agents   . Iodine   . Methadone   . Pentazocine Lactate   . Vicodin (Hydrocodone-Acetaminophen)     Past Medical History  Diagnosis Date  . PAF (paroxysmal atrial fibrillation)   . Diabetes mellitus   . Hypothyroidism   . Hyperlipidemia   . Hypertension   . Chronic pain   . Melanoma   . OA (osteoarthritis)   . COPD (chronic obstructive pulmonary disease)   . Asthma   . RA (rheumatoid arthritis)   . Hepatitis C   . Venous stasis   . Bipolar 1 disorder     Past Surgical History  Procedure Date  . Hernia repair   . Thyroidectomy   . Tonsillectomy   . US echocardiography 02/19/2010    EF 55-60%    History  Smoking status  .  Current Every Day Smoker -- 1.0 packs/day  Smokeless tobacco  . Not on file    History  Alcohol Use No    Family History  Problem Relation Age of Onset  . Hypertension Mother   . Alzheimer's disease Mother   . Heart disease Father     Reviw of Systems:  Reviewed in the HPI.  All other systems are negative.  Physical Exam: Blood pressure 110/68, pulse 93, resp. rate 18, height 6\' 9"  (2.057 m), weight 250 lb 1.9 oz (113.454 kg), SpO2 98.00%. General: Well developed, well nourished, in no acute distress.  Head: Normocephalic, atraumatic, sclera non-icteric, mucus membranes are moist,   Neck: Supple. Carotids are 2 + without bruits. No JVD   Lungs: Clear   Heart: RR, soft systolic murmur.  Abdomen: Soft, non-tender, non-distended with normal bowel sounds.  Msk:  Strength and tone are normal   Extremities: No clubbing or cyanosis. No edema.  Distal pedal pulses are 2+ and equal    Neuro: CN II - XII intact.  Alert and oriented X 3.   Psych:  He has a history of bipolar disease.  ECG:  November 5 , 2013 in the emergency room reveals normal sinus rhythm with a controlled ventricular response.  Assessment / Plan:

## 2012-07-16 NOTE — Assessment & Plan Note (Signed)
Johnny Sims seems to be doing well. He has not been seen in our office in over 2 years. He was placed on chronic Coumadin therapy and he has been checking his levels at his general medical doctor's office.  We've updated medication list and her problem was.  He presents today for further evaluation. He doesn't have any cardiac complaints.  We will go head refill his metoprolol. He's been having some abdominal problems so I will discontinue his digoxin.  He remains in atrial fibrillation. He does have a history of diabetes and hypertension some chronic Coumadin therapy or other method of anticoagulation will is appropriate.  He's been having lots of abdominal problems, polyuria and polydipsia.  Suspect that his glucose levels are elevated although he states that they're fairly well controlled. I've recommended that he see his medical doctor.

## 2012-07-16 NOTE — Patient Instructions (Addendum)
Your physician has recommended you make the following change in your medication:   STOP DIGOXIN  Your physician wants you to follow-up in: 6 MONTHS You will receive a reminder letter in the mail two months in advance. If you don't receive a letter, please call our office to schedule the follow-up appointment.

## 2012-07-17 ENCOUNTER — Telehealth: Payer: Self-pay | Admitting: Cardiovascular Disease

## 2012-07-17 ENCOUNTER — Other Ambulatory Visit: Payer: Self-pay | Admitting: *Deleted

## 2012-07-17 NOTE — Telephone Encounter (Signed)
Pt was in yesterday and medication list is incorrect, also pt was to be referred to [facility], and pt doesn't want that referral until he calls back to let us know,  pls call [phone]

## 2012-07-17 NOTE — Telephone Encounter (Signed)
Mar adjusted 

## 2012-07-17 NOTE — Progress Notes (Signed)
Pt called to update his medication list, 2 removed one added as historical only.

## 2012-07-22 ENCOUNTER — Encounter (HOSPITAL_COMMUNITY): Payer: Self-pay | Admitting: *Deleted

## 2012-07-22 ENCOUNTER — Observation Stay (HOSPITAL_COMMUNITY)
Admission: EM | Admit: 2012-07-22 | Discharge: 2012-07-23 | Discharge status: Against Medical Advice | Payer: Medicaid Other | Attending: Family Medicine | Admitting: Family Medicine

## 2012-07-22 DIAGNOSIS — M2550 Pain in unspecified joint: Principal | ICD-10-CM | POA: Insufficient documentation

## 2012-07-22 DIAGNOSIS — J4489 Other specified chronic obstructive pulmonary disease: Secondary | ICD-10-CM | POA: Insufficient documentation

## 2012-07-22 DIAGNOSIS — G8929 Other chronic pain: Secondary | ICD-10-CM | POA: Insufficient documentation

## 2012-07-22 DIAGNOSIS — F3110 Bipolar disorder, current episode manic without psychotic features, unspecified: Secondary | ICD-10-CM | POA: Insufficient documentation

## 2012-07-22 DIAGNOSIS — B1920 Unspecified viral hepatitis C without hepatic coma: Secondary | ICD-10-CM | POA: Insufficient documentation

## 2012-07-22 DIAGNOSIS — E871 Hypo-osmolality and hyponatremia: Secondary | ICD-10-CM

## 2012-07-22 DIAGNOSIS — I10 Essential (primary) hypertension: Secondary | ICD-10-CM | POA: Insufficient documentation

## 2012-07-22 DIAGNOSIS — I4891 Unspecified atrial fibrillation: Secondary | ICD-10-CM | POA: Insufficient documentation

## 2012-07-22 DIAGNOSIS — Z7901 Long term (current) use of anticoagulants: Secondary | ICD-10-CM | POA: Insufficient documentation

## 2012-07-22 DIAGNOSIS — E119 Type 2 diabetes mellitus without complications: Secondary | ICD-10-CM | POA: Insufficient documentation

## 2012-07-22 DIAGNOSIS — Z794 Long term (current) use of insulin: Secondary | ICD-10-CM | POA: Insufficient documentation

## 2012-07-22 DIAGNOSIS — Z79899 Other long term (current) drug therapy: Secondary | ICD-10-CM | POA: Insufficient documentation

## 2012-07-22 DIAGNOSIS — J449 Chronic obstructive pulmonary disease, unspecified: Secondary | ICD-10-CM | POA: Insufficient documentation

## 2012-07-22 LAB — CBC WITH DIFFERENTIAL/PLATELET
Eosinophils Absolute: 0.2 10*3/uL (ref 0.0–0.7)
Eosinophils Relative: 2 % (ref 0–5)
HCT: 39.4 % (ref 39.0–52.0)
Hemoglobin: 13.9 g/dL (ref 13.0–17.0)
Lymphs Abs: 2.6 10*3/uL (ref 0.7–4.0)
MCH: 28.7 pg (ref 26.0–34.0)
MCV: 81.2 fL (ref 78.0–100.0)
Monocytes Absolute: 0.9 10*3/uL (ref 0.1–1.0)
Monocytes Relative: 9 % (ref 3–12)
Platelets: 153 10*3/uL (ref 150–400)
RBC: 4.85 MIL/uL (ref 4.22–5.81)

## 2012-07-22 LAB — BASIC METABOLIC PANEL
BUN: 21 mg/dL (ref 6–23)
Calcium: 9.1 mg/dL (ref 8.4–10.5)
Creatinine, Ser: 1.15 mg/dL (ref 0.50–1.35)
GFR calc non Af Amer: 69 mL/min — ABNORMAL LOW (ref >90)
Glucose, Bld: 120 mg/dL — ABNORMAL HIGH (ref 70–99)

## 2012-07-22 MED ORDER — HYDROMORPHONE HCL PF 2 MG/ML IJ SOLN
2.0000 mg | Freq: Once | INTRAMUSCULAR | Status: DC
  Filled 2012-07-22: qty 1

## 2012-07-22 MED ORDER — SODIUM CHLORIDE 0.9 % IV BOLUS (SEPSIS)
500.0000 mL | Freq: Once | INTRAVENOUS | Status: AC
  Administered 2012-07-22: 500 mL via INTRAVENOUS

## 2012-07-22 MED ORDER — HYDROMORPHONE HCL PF 1 MG/ML IJ SOLN: 1.0000 mg | Freq: Once | INTRAMUSCULAR | Status: DC

## 2012-07-22 MED ORDER — MORPHINE SULFATE 4 MG/ML IJ SOLN
4.0000 mg | Freq: Once | INTRAMUSCULAR | Status: AC
  Administered 2012-07-22: 4 mg via INTRAMUSCULAR
  Filled 2012-07-22: qty 1

## 2012-07-22 MED ORDER — ACETAMINOPHEN 500 MG PO TABS: 1000.0000 mg | ORAL_TABLET | Freq: Once | ORAL | Status: DC

## 2012-07-22 NOTE — ED Notes (Signed)
MD at bedside. 

## 2012-07-22 NOTE — ED Notes (Signed)
Arrived by [facility]. Pt reports generalized joint pain x 30 minutes. No distress noted at triage.
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
## 2012-07-22 NOTE — ED Provider Notes (Signed)
History  This chart was scribed for non-physician practitioner working with Benny Lennert, MD by Ardeen Jourdain, ED Scribe. This patient was seen in room  and the patient's care was started at 1938.  CSN: [id number]  Arrival date & time 07/22/12  1839   First MD Initiated Contact with Patient 07/22/12 1938      Chief Complaint  Patient presents with  . Joint Pain     The history is provided by the patient. No language interpreter was used.    Johnny Sims is a 57 y.o. male brought in by ambulance, who presents to the Emergency Department complaining of sudden onset generalized joint pain with associated nausea and abdominal pain and body cramping. He describes the pain as stabbing and shooting. He also describes the pain as intermittently cramping and burning. He states the pain is not similar to previous pain he has had. He states the pain occurred after he injected himself with insulin. He states he has used insulin once before. He is also complaining of a change in the look of the skin of his legs and feet with associated leg cramping. He denies any CP and SOB.    Past Medical History  Diagnosis Date  . PAF (paroxysmal atrial fibrillation)   . Diabetes mellitus   . Hypothyroidism   . Hyperlipidemia   . Hypertension   . Chronic pain   . Melanoma   . OA (osteoarthritis)   . COPD (chronic obstructive pulmonary disease)   . Asthma   . RA (rheumatoid arthritis)   . Hepatitis C   . Venous stasis   . Bipolar 1 disorder     Past Surgical History  Procedure Date  . Hernia repair   . Thyroidectomy   . Tonsillectomy   . US echocardiography 02/19/2010    EF 55-60%    Family History  Problem Relation Age of Onset  . Hypertension Mother   . Alzheimer's disease Mother   . Heart disease Father     History  Substance Use Topics  . Smoking status: Current Every Day Smoker -- 1.0 packs/day  . Smokeless tobacco: Not on file  . Alcohol Use: No      Review of Systems    Musculoskeletal: Positive for arthralgias.  All other systems reviewed and are negative.    Allergies  Antihistamines, chlorpheniramine-type; Iodinated diagnostic agents; Iodine; Methadone; Pentazocine lactate; Sulfa antibiotics; and Vicodin  Home Medications   Current Outpatient Rx  Name  Route  Sig  Dispense  Refill  . AMLODIPINE BESYLATE 5 MG PO TABS   Oral   Take 1 tablet (5 mg total) by mouth daily.   30 tablet   6   . VITAMIN D 1000 UNITS PO TABS   Oral   Take 1,000 Units by mouth once a week.           Marland Kitchen DIAZEPAM 10 MG PO TABS   Oral   Take 10 mg by mouth every 6 (six) hours as needed. For anxiety         . OMEGA-3 FATTY ACIDS 1000 MG PO CAPS   Oral   Take 1 g by mouth daily.           . INSULIN GLARGINE 100 UNIT/ML [place] SOLN   Subcutaneous   Inject 48 Units into the skin at bedtime.         . INSULIN LISPRO (HUMAN) 100 UNIT/ML [place] SOLN   Subcutaneous   Inject 8 Units into  the skin 3 (three) times daily before meals.         Marland Kitchen LEVOTHYROXINE SODIUM 88 MCG PO TABS   Oral   Take 88 mcg by mouth daily.           Marland Kitchen LISINOPRIL 40 MG PO TABS   Oral   Take 40 mg by mouth 2 (two) times daily.           Marland Kitchen LORATADINE 10 MG PO TABS   Oral   Take 10 mg by mouth daily.           Marland Kitchen MECLIZINE HCL 25 MG PO TABS   Oral   Take 25 mg by mouth 3 (three) times daily as needed.           Marland Kitchen METFORMIN HCL 1000 MG PO TABS   Oral   Take 1,000 mg by mouth 2 (two) times daily with a meal.           . METOPROLOL SUCCINATE ER 50 MG PO TB24   Oral   Take 1 tablet (50 mg total) by mouth daily. Take with or immediately following a meal.   30 tablet   6   . ONE-DAILY MULTI VITAMINS PO TABS   Oral   Take 1 tablet by mouth daily.           Marland Kitchen OMEPRAZOLE 40 MG PO CPDR   Oral   Take 40 mg by mouth daily.           . OXYCODONE HCL 30 MG PO TABS   Oral   Take 30 mg by mouth every 4 (four) hours as needed. For pain   30 tablet   0     Historical med  only   . PROMETHAZINE HCL 25 MG PO TABS   Oral   Take 25 mg by mouth every 6 (six) hours as needed. For nausea         . SIMVASTATIN 20 MG PO TABS   Oral   Take 20 mg by mouth at bedtime.           . SUCRALFATE 1 GM/10ML PO SUSP   Oral   Take 10 mLs (1 g total) by mouth 4 (four) times daily.   420 mL   0   . TIOTROPIUM BROMIDE MONOHYDRATE 18 MCG IN CAPS   Inhalation   Place 18 mcg into inhaler and inhale daily.           Marland Kitchen VITAMIN C 500 MG PO TABS   Oral   Take 500 mg by mouth daily.         . WARFARIN SODIUM 10 MG PO TABS   Oral   Take 10 mg by mouth daily.           Triage Vitals: BP 133/85  Pulse 112  Temp 97.5 F (36.4 C) (Oral)  Resp 26  SpO2 97%  Physical Exam  Nursing note and vitals reviewed. Constitutional: He is oriented to person, place, and time. He appears well-developed and well-nourished. No distress.  HENT:  Head: Normocephalic and atraumatic.  Eyes: EOM are normal. Pupils are equal, round, and reactive to light.  Neck: Normal range of motion. Neck supple. No tracheal deviation present.  Cardiovascular: Normal rate, regular rhythm, normal heart sounds and intact distal pulses.  Exam reveals no gallop and no friction rub.   No murmur heard.      1+ pitting edema of bilateral lower extremities   Pulmonary/Chest: Effort  normal and breath sounds normal. No respiratory distress. He has no wheezes. He has no rales. He exhibits no tenderness.  Abdominal: Soft. Bowel sounds are normal. He exhibits no distension and no mass. There is no tenderness. There is no rebound and no guarding.  Musculoskeletal: Normal range of motion. He exhibits no edema and no tenderness.  Neurological: He is alert and oriented to person, place, and time.  Skin: Skin is warm and dry.       Diffuse petechial and purpura rash of bilateral lower extremities   Psychiatric: He has a normal mood and affect. His behavior is normal.    ED Course  Procedures (including  critical care time)  DIAGNOSTIC STUDIES: Oxygen Saturation is 97% on room air, normal by my interpretation.    COORDINATION OF CARE:  8:43 PM: Discussed treatment plan which includes CBC, BMP and pain medication with pt at bedside and pt agreed to plan.     Labs Reviewed  BASIC METABOLIC PANEL - Abnormal; Notable for the following:    Sodium 124 (*)     Chloride 88 (*)     Glucose, Bld 120 (*)     GFR calc non Af Amer 69 (*)     GFR calc Af Amer 80 (*)     All other components within normal limits  GLUCOSE, CAPILLARY - Abnormal; Notable for the following:    Glucose-Capillary 132 (*)     All other components within normal limits  CBC WITH DIFFERENTIAL  COMPREHENSIVE METABOLIC PANEL  PROTIME-INR  BASIC METABOLIC PANEL  BASIC METABOLIC PANEL  BASIC METABOLIC PANEL  BASIC METABOLIC PANEL  BASIC METABOLIC PANEL   No results found.   1. Hyponatremia       MDM  Family practice will admit the patient for hyponatremia.       I personally performed the services described in this documentation, which was scribed in my presence. The recorded information has been reviewed and is accurate.  Medical screening examination/treatment/procedure(s) were performed by non-physician practitioner and as supervising physician I was immediately available for consultation/collaboration.   Benny Lennert, MD 07/24/12 1250

## 2012-07-23 ENCOUNTER — Encounter (HOSPITAL_COMMUNITY): Payer: Self-pay | Admitting: *Deleted

## 2012-07-23 DIAGNOSIS — E871 Hypo-osmolality and hyponatremia: Secondary | ICD-10-CM

## 2012-07-23 LAB — COMPREHENSIVE METABOLIC PANEL
ALT: 21 U/L (ref 0–53)
Alkaline Phosphatase: 55 U/L (ref 39–117)
BUN: 19 mg/dL (ref 6–23)
CO2: 24 mEq/L (ref 19–32)
Chloride: 91 mEq/L — ABNORMAL LOW (ref 96–112)
GFR calc Af Amer: >90 mL/min (ref >90)
Glucose, Bld: 130 mg/dL — ABNORMAL HIGH (ref 70–99)
Potassium: 4.2 mEq/L (ref 3.5–5.1)
Sodium: 126 mEq/L — ABNORMAL LOW (ref 135–145)
Total Bilirubin: 0.4 mg/dL (ref 0.3–1.2)
Total Protein: 6.5 g/dL (ref 6.0–8.3)

## 2012-07-23 LAB — CK: Total CK: 136 U/L (ref 7–232)

## 2012-07-23 LAB — HEMOGLOBIN A1C: Hgb A1c MFr Bld: 13.9 % — ABNORMAL HIGH (ref <5.7)

## 2012-07-23 LAB — GLUCOSE, CAPILLARY: Glucose-Capillary: 127 mg/dL — ABNORMAL HIGH (ref 70–99)

## 2012-07-23 MED ORDER — WARFARIN - PHARMACIST DOSING INPATIENT: Freq: Every day | Status: DC

## 2012-07-23 MED ORDER — ENOXAPARIN SODIUM 40 MG/0.4ML ~~LOC~~ SOLN: 40.0000 mg | SUBCUTANEOUS | Status: DC

## 2012-07-23 MED ORDER — INSULIN ASPART 100 UNIT/ML ~~LOC~~ SOLN: 0.0000 [IU] | Freq: Three times a day (TID) | SUBCUTANEOUS | Status: DC

## 2012-07-23 MED ORDER — INSULIN ASPART 100 UNIT/ML ~~LOC~~ SOLN: 0.0000 [IU] | Freq: Every day | SUBCUTANEOUS | Status: DC

## 2012-07-23 MED ORDER — NALOXONE HCL 0.4 MG/ML IJ SOLN: 0.4000 mg | INTRAMUSCULAR | Status: DC | PRN

## 2012-07-23 MED ORDER — NICOTINE 21 MG/24HR TD PT24: 21.0000 mg | MEDICATED_PATCH | Freq: Every day | TRANSDERMAL | Status: DC

## 2012-07-23 MED ORDER — QUETIAPINE FUMARATE 50 MG PO TABS
50.0000 mg | ORAL_TABLET | Freq: Two times a day (BID) | ORAL | Status: DC
  Filled 2012-07-23 (×2): qty 1

## 2012-07-23 NOTE — Progress Notes (Signed)
ANTICOAGULATION CONSULT NOTE - Initial Consult  Pharmacy Consult for Coumadin Indication: Afib  Allergies  Allergen Reactions  . Antihistamines, Chlorpheniramine-Type   . Iodinated Diagnostic Agents   . Iodine   . Methadone   . Pentazocine Lactate   . Sulfa Antibiotics   . Vicodin (Hydrocodone-Acetaminophen)     Patient Measurements:    Vital Signs: Temp: 97.9 F (36.6 C) (01/27 0221) Temp src: Oral (01/27 0221) BP: 112/60 mmHg (01/27 0221) Pulse Rate: 84  (01/27 0221)  Labs:  Alvira Philips 07/23/12 0052 07/23/12 0042 07/22/12 2120  HGB -- -- 13.9  HCT -- -- 39.4  PLT -- -- 153  APTT -- -- --  LABPROT -- 23.7* --  INR -- 2.23* --  HEPARINUNFRC -- -- --  CREATININE -- 1.00 1.15  CKTOTAL 136 -- --  CKMB -- -- --  TROPONINI -- -- --    The CrCl is unknown because both a height and weight (above a minimum accepted value) are required for this calculation.   Medical History: Past Medical History  Diagnosis Date  . PAF (paroxysmal atrial fibrillation)   . Diabetes mellitus   . Hypothyroidism   . Hyperlipidemia   . Hypertension   . Chronic pain   . Melanoma   . OA (osteoarthritis)   . COPD (chronic obstructive pulmonary disease)   . Asthma   . RA (rheumatoid arthritis)   . Hepatitis C   . Venous stasis   . Bipolar 1 disorder     Medications:  Norvasc  Vitamin D  Valium  Lovaza  Lantus  Humalog  Zestril  Claritin  Antivert  Metformin  Toprol XL  MVI  Prilosec  OxyIR  Zocor  Phenergan  Carafate  Spiriva  Vit C  Coumadin 10 mg daily  Assessment: 57 yo male admitted with muscle cramps, h/o Afib, to continue anticoagulation  Goal of Therapy:  INR 2-3 Monitor platelets by anticoagulation protocol: Yes   Plan:  Continue home regimen for now Daily INR  Davine Sweney, Gary Fleet 07/23/2012,2:27 AM

## 2012-07-23 NOTE — Progress Notes (Signed)
I was called and informed by the RN taking care of Johnny Sims that he has left AMA within the last 15 minutes and has left the unit. He verbalized to the nurse understanding about AMA and signed the paperwork at which time he left immediately.    Murtis Sink, MD 07/23/2012 [phone]

## 2012-07-23 NOTE — H&P (Signed)
I discussed the care plan with Dr Durene Cal and the [facility] team and agree with assessment and plan as documented in the admission note above. He left the hospital before I had the opportunity to see him.

## 2012-07-24 NOTE — Discharge Summary (Signed)
I discussed the care plan with Dr Ermalinda Memos and the [facility] team and agree with assessment as documented in the discharge note above. He left the hospital before I had the opportunity to see him.

## 2012-07-24 NOTE — Discharge Summary (Signed)
Family Medicine Teaching [facility] Discharge Summary  Patient name: Johnny Sims Medical record number: [id number] Date of birth: March 03, 1956 Age: 57 y.o. Gender: male Date of Admission: 07/22/2012  Date of Discharge: 07/23/2012 Admitting Physician: Tobin Chad, MD  Primary Care Provider: Dois Davenport., MD  Indication for Hospitalization: Cramps, hyponatremia, mania Discharge Diagnoses:  1. Hyponatremia 2. Bipolar 1 disorder- Manic or hypomanic currently 3. Hepatitis C 4. DM2 5. HTN 6. COPD 7. Afib  Consultations: None  Significant Labs and Imaging:   Lab 07/23/12 0042 07/22/12 2120  NA 126* 124*  K 4.2 4.5  CL 91* 88*  CO2 24 24  GLUCOSE 130* 120*  BUN 19 21  CREATININE 1.00 1.15  CALCIUM 8.7 9.1  MG -- --  PHOS -- --    Lab 07/22/12 2120  HGB 13.9  HCT 39.4  WBC 10.4  PLT 153     Procedures: none  Brief Hospital Course:  COBIN CADAVID is a 57 y.o. year old male with PMH of Afib, DM2, HTN, COPD, Chronic pain, and Bipolar 1 disorder, presenting with muscle cramps likely due to hyponatremia, he also appeared to be manic. We admitted him and he left AMA later that night at 3 am. He left AMA after he showed nursing his percocet and refused to allow them to hold it so they could administer only the narcotics prescribed. Below was our potential work up at that time.    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 He also currently appears to be approaching mania with his agitation, distractability, grandiosity, and decreased need for sleep.   Hyponatremia  - Appeared euvolemic to hypervolemic with MMM and BL pitting edema, however his BUN/CRe ratio is 18 and he appears to be orthostatic giving merit to hypovolemia perhaps intravascularly  - DDX includes primary polydipsia, SIADH, Cirrhosis (Hx of Hep C), renal losses  -  Serum Na, urine Na, Fena, PT/INR, CMP, UA, Q4 BMP  - recieving 1 L bolus now, seems to be imprving symptomatically, will continue IV NS at 150 ml/hr.  - goal improvement 4-6 meq in 24 hours.   Muscle Cramping -will check CK, has been on statin but not a new start and doubt statin related myopathy. Possible neuropathy from DM but new onset and not in typical distribution. ? Withdrawal from narcotics if patient had run out. Dehydration certainly could be contributing. Could consider TSH. Lean towards hyponatremia as mentioned above.   Bipolar Disorder  - Appears to be manic or hypomanic currently  - Continue valium per home dose (10 Q4 PRN) and add seroquel 50 BID (QTc 407 in 04/2012)  *noted that seroquel can cause hyperglycemia but sugars seem well controlled currently  - Consider psych consult in the am vs OP psych appointmnet   Hepatitis C  - No obvious stigmata of liver failure, possibly the cause of hypervolemic hyponatremia  - CMP   DM2  - Continue lantus 48 units daily  - hold TID pre-meal insulin and metformin, add SSI  - Monitor CBGs   HTN  - Well controlled here, likely orthostatic (will check orthostatics)  - Continue lisinopril, Metoprolol, and amlodipine   COPD  - no complaints today  - Continue spiriva   Chronic Pain  - At baseline, continue roxicet 30 mg q4, PRN narcan   Afib  - Rate controlled  - continue metoprolol  -  continue warfarin per pharm   FEN/GI: Carb modified diet  Prophylaxis: anticoagulated with warfarin, INR pending  Disposition: Tele, home when hyponatremia improved and will likely need OP psychiatry evaluation.  Code Status: Full for now, not discussed with patient  Dispo: Patient left AMA  Discharge Medications:    Medication List     As of 07/24/2012  1:53 PM    ASK your doctor about these medications         amLODipine 5 MG tablet   Commonly known as: NORVASC   Take 1 tablet (5 mg total) by mouth daily.      cholecalciferol  1000 UNITS tablet   Commonly known as: VITAMIN D   Take 1,000 Units by mouth once a week.      diazepam 10 MG tablet   Commonly known as: VALIUM   Take 10 mg by mouth every 6 (six) hours as needed. For anxiety      fish oil-omega-3 fatty acids 1000 MG capsule   Take 1 g by mouth daily.      insulin glargine 100 UNIT/ML injection   Commonly known as: LANTUS   Inject 48 Units into the skin at bedtime.      insulin lispro 100 UNIT/ML injection   Commonly known as: HUMALOG   Inject 8 Units into the skin 3 (three) times daily before meals.      levothyroxine 88 MCG tablet   Commonly known as: SYNTHROID, LEVOTHROID   Take 88 mcg by mouth daily.      lisinopril 40 MG tablet   Commonly known as: PRINIVIL,ZESTRIL   Take 40 mg by mouth 2 (two) times daily.      loratadine 10 MG tablet   Commonly known as: CLARITIN   Take 10 mg by mouth daily.      meclizine 25 MG tablet   Commonly known as: ANTIVERT   Take 25 mg by mouth 3 (three) times daily as needed.      metFORMIN 1000 MG tablet   Commonly known as: GLUCOPHAGE   Take 1,000 mg by mouth 2 (two) times daily with a meal.      metoprolol succinate 50 MG 24 hr tablet   Commonly known as: TOPROL-XL   Take 1 tablet (50 mg total) by mouth daily. Take with or immediately following a meal.      multivitamin tablet   Take 1 tablet by mouth daily.      omeprazole 40 MG capsule   Commonly known as: PRILOSEC   Take 40 mg by mouth daily.      oxycodone 30 MG immediate release tablet   Commonly known as: ROXICODONE   Take 30 mg by mouth every 4 (four) hours as needed. For pain      promethazine 25 MG tablet   Commonly known as: PHENERGAN   Take 25 mg by mouth every 6 (six) hours as needed. For nausea      simvastatin 20 MG tablet   Commonly known as: ZOCOR   Take 20 mg by mouth at bedtime.      sucralfate 1 GM/10ML suspension   Commonly known as: CARAFATE   Take 10 mLs (1 g total) by mouth 4 (four) times daily.       tiotropium 18 MCG inhalation capsule   Commonly known as: SPIRIVA   Place 18 mcg into inhaler and inhale daily.      vitamin C 500 MG tablet   Commonly known as: ASCORBIC ACID  Take 500 mg by mouth daily.      warfarin 10 MG tablet   Commonly known as: COUMADIN   Take 10 mg by mouth daily.       Issues for Follow Up:  - Sodium level and follow up with psychiatry  Discharge Condition: Stable, Left AMA  Kevin Fenton, MD 07/24/2012, 1:53 PM

## 2012-08-15 NOTE — Telephone Encounter (Signed)
Pt called because he said Dr. Elease Hashimoto suggested for him to take an anticoagulant other that Coumadin. Pt states that with a different medication, he will be able to do other things that he can't do with coumadin. Pt has an appointment with Dr. Elease Hashimoto on 09/26/12 . He would like to have the medication change sooner.

## 2012-08-15 NOTE — Telephone Encounter (Signed)
New Problem    Pt concerned about his high blood sugar. Stated he wanted to come in to be seen but only wanted to see Dr. Elease Hashimoto. First available not until 4/2. Went ahead and scheduled appt for pt, did not want to see any extenders.

## 2012-08-23 NOTE — Telephone Encounter (Signed)
PT MAY BE SWITCHED TO XARELTO 20 MG IF INR IS BELOW 3.0, INFORMED HIM THE PRESCRIPTION MAY NOT BE COVERED BY MEDICARE AND WILL NEED TO PAY OUT OF POCKET, PT WILL WAIT TILL SEEN BY PCP THAT DOES HIS INRS AND ASK FURTHER ADVISE, HE WILL CALL BACK IF HE WANTS TO SWITCH.

## 2012-09-03 ENCOUNTER — Other Ambulatory Visit: Payer: Self-pay | Admitting: *Deleted

## 2012-09-04 ENCOUNTER — Other Ambulatory Visit: Payer: Self-pay | Admitting: Internal Medicine

## 2012-09-04 DIAGNOSIS — B182 Chronic viral hepatitis C: Secondary | ICD-10-CM

## 2012-09-07 ENCOUNTER — Telehealth: Payer: Self-pay | Admitting: Cardiovascular Disease

## 2012-09-07 NOTE — Telephone Encounter (Signed)
New problem     From last office visit new medication was discuss. Patient does not remember the name . At the MD office now.

## 2012-09-07 NOTE — Telephone Encounter (Signed)
Told him the med was xarelto 20 mg. Pt will talk to md

## 2012-09-11 ENCOUNTER — Other Ambulatory Visit: Payer: Medicaid Other

## 2012-09-26 ENCOUNTER — Ambulatory Visit (INDEPENDENT_AMBULATORY_CARE_PROVIDER_SITE_OTHER): Payer: Medicaid Other | Admitting: Cardiovascular Disease

## 2012-09-26 ENCOUNTER — Encounter: Payer: Self-pay | Admitting: Cardiovascular Disease

## 2012-09-26 VITALS — BP 106/66 | HR 82 | Ht >= 80 in | Wt 253.0 lb

## 2012-09-26 DIAGNOSIS — I4891 Unspecified atrial fibrillation: Secondary | ICD-10-CM

## 2012-09-26 DIAGNOSIS — I480 Paroxysmal atrial fibrillation: Secondary | ICD-10-CM

## 2012-09-26 NOTE — Progress Notes (Signed)
Johnny Sims Date of Birth  [birth year]       [facility] [address]  [address] [place], [place]  [address]   [place], [place]  [address] [phone]     [phone]   Fax  [phone]    Fax [phone]  Problem List: 1. Paroxysmal atrial fibrillation 2. Hypothyroidism 3. Diabetes mellitus 4. Hyperlipidemia 5. Hypertension 6. Chronic pain  History of Present Illness:  Johnny Sims is a 57 yo who I have seen in the past for PAF.  His primary medical office is the [place]  office in [place].  He is on his third doctor in the past several years. He is not very  happy about this.  He's having lots of abdominal pain. He's had a poor appetite. He complains of being very thirsty and his reflux water. Also complains of polyuria. He states that his glucose levels have been well controlled.  He has having lots of generalized fatigue. She also is having trouble swallowing and chokes when he eats his cereal in the morning.  He denies any chest pain or shortness breath. He was seen in the emergency room in November, 2013 gallbladder pain. He was noted to be in normal sinus rhythm by EKG at that time.  September 26, 2012:  Sanjeev has been doing well.  He would like to try Xarelto instead of coumadin.  He is doing well.  No CP.  He is having some problems with his Hep C.    Current Outpatient Prescriptions on File Prior to Visit  Medication Sig Dispense Refill  . amLODipine (NORVASC) 5 MG tablet Take 1 tablet (5 mg total) by mouth daily.  30 tablet  6  . cholecalciferol (VITAMIN D) 1000 UNITS tablet Take 1,000 Units by mouth once a week.        . diazepam (VALIUM) 10 MG tablet Take 10 mg by mouth every 6 (six) hours as needed. For anxiety      . fish oil-omega-3 fatty acids 1000 MG capsule Take 1 g by mouth daily.        Marland Kitchen levothyroxine (SYNTHROID, LEVOTHROID) 88 MCG tablet Take 88 mcg by mouth daily.        Marland Kitchen lisinopril  (PRINIVIL,ZESTRIL) 40 MG tablet Take 40 mg by mouth 2 (two) times daily.        Marland Kitchen loratadine (CLARITIN) 10 MG tablet Take 10 mg by mouth daily.        . meclizine (ANTIVERT) 25 MG tablet Take 25 mg by mouth 3 (three) times daily as needed.        . metoprolol succinate (TOPROL-XL) 50 MG 24 hr tablet Take 1 tablet (50 mg total) by mouth daily. Take with or immediately following a meal.  30 tablet  6  . Multiple Vitamin (MULTIVITAMIN) tablet Take 1 tablet by mouth daily.        Marland Kitchen omeprazole (PRILOSEC) 40 MG capsule Take 40 mg by mouth daily.        Marland Kitchen oxycodone (ROXICODONE) 30 MG immediate release tablet Take 30 mg by mouth every 4 (four) hours as needed. For pain  30 tablet  0  . promethazine (PHENERGAN) 25 MG tablet Take 25 mg by mouth every 6 (six) hours as needed. For nausea      . simvastatin (ZOCOR) 20 MG tablet Take 20 mg by mouth at bedtime.        . sucralfate (  CARAFATE) 1 GM/10ML suspension Take 10 mLs (1 g total) by mouth 4 (four) times daily.  420 mL  0  . tiotropium (SPIRIVA) 18 MCG inhalation capsule Place 18 mcg into inhaler and inhale daily.        . vitamin C (ASCORBIC ACID) 500 MG tablet Take 500 mg by mouth daily.      Marland Kitchen warfarin (COUMADIN) 10 MG tablet Take 10 mg by mouth daily.       No current facility-administered medications on file prior to visit.    Allergies  Allergen Reactions  . Antihistamines, Chlorpheniramine-Type   . Iodinated Diagnostic Agents   . Iodine   . Methadone   . Pentazocine Lactate   . Sulfa Antibiotics   . Vicodin (Hydrocodone-Acetaminophen)     Past Medical History  Diagnosis Date  . PAF (paroxysmal atrial fibrillation)   . Diabetes mellitus   . Hypothyroidism   . Hyperlipidemia   . Hypertension   . Chronic pain   . Melanoma   . OA (osteoarthritis)   . COPD (chronic obstructive pulmonary disease)   . Asthma   . RA (rheumatoid arthritis)   . Hepatitis C   . Venous stasis   . Bipolar 1 disorder     Past Surgical History  Procedure  Laterality Date  . Hernia repair    . Thyroidectomy    . Tonsillectomy    . US echocardiography  02/19/2010    EF 55-60%    History  Smoking status  . Current Every Day Smoker -- 1.00 packs/day  Smokeless tobacco  . Not on file    History  Alcohol Use No    Family History  Problem Relation Age of Onset  . Hypertension Mother   . Alzheimer's disease Mother   . Heart disease Father     Reviw of Systems:  Reviewed in the HPI.  All other systems are negative.  Physical Exam: Blood pressure 106/66, pulse 82, height 6\' 9"  (2.057 m), weight 253 lb (114.76 kg), SpO2 95.00%. General: Well developed, well nourished, in no acute distress.  Head: Normocephalic, atraumatic, sclera non-icteric, mucus membranes are moist,  Poor dentition  Neck: Supple. Carotids are 2 + without bruits. No JVD   Lungs: Clear   Heart: RR, soft systolic murmur.  Abdomen: Soft, non-tender, non-distended with normal bowel sounds.  Msk:  Strength and tone are normal   Extremities: No clubbing or cyanosis. No edema.  Chronic stasis changes.   Distal pedal pulses are 2+ and equal   Neuro: CN II - XII intact.  Alert and oriented X 3.   Psych:  He has a history of bipolar disease.  ECG:  November 5 , 2013 in the emergency room reveals normal sinus rhythm with a controlled ventricular response.  Assessment / Plan:

## 2012-09-26 NOTE — Patient Instructions (Addendum)
Your physician wants you to follow-up in: 1 YEAR  You will receive a reminder letter in the mail two months in advance. If you don't receive a letter, please call our office to schedule the follow-up appointment.  Your physician recommends that you continue on your current medications as directed. Please refer to the Current Medication list given to you today.  HAVE DR Hal Hope TRANSITION YOU TO XARELTO FROM  COUMADIN SINCE YOUR COUMADIN IS MANAGED THERE.

## 2012-09-26 NOTE — Assessment & Plan Note (Signed)
Johnny Sims has a history of paroxysmal atrial fibrillation. He is admitted in 2011 and was found to have atrial ablation at that time. He's been on Coumadin and has been managed by his primary medical doctor.  He would like to switch to Xarelto.  His coumadin has been managed by Dr. Hal Hope.  I have recommended that he start on Xarelto 20 mg a day.  He will be able to start Xarelto if his INR is < 3.0.    I will see him in 1 year for follow up visit.

## 2012-12-19 ENCOUNTER — Telehealth: Payer: Self-pay | Admitting: Cardiovascular Disease

## 2012-12-19 NOTE — Telephone Encounter (Signed)
I do not have any advice on which pain meds he should take

## 2012-12-19 NOTE — Telephone Encounter (Signed)
PT agitated, pcp is wanting to switch pt off oxycodone and put him on oxycontin. Pt wants you to know because he has afib and thought you agreed that he should not be switched. pcp also wants him to go to pain management clinic and is very upset because he had a bad experience prior. Please let me know if you have any advise on the switch.

## 2012-12-19 NOTE — Telephone Encounter (Signed)
New problem   Pt is concerned that pcp is trying to put him on OxyContin & says he shouldn't be taking it

## 2012-12-20 NOTE — Telephone Encounter (Signed)
Pt informed

## 2013-01-10 ENCOUNTER — Telehealth: Payer: Self-pay | Admitting: Cardiovascular Disease

## 2013-01-10 NOTE — Telephone Encounter (Signed)
msg left Dr Elease Hashimoto didn't have anyone in mind but would recommend [facility]/ number [phone], asked him to call there.

## 2013-01-10 NOTE — Telephone Encounter (Signed)
New problem   Per pt he's having problems w/his new dr and dr Elease Hashimoto said he would help him find a new dr

## 2013-03-06 ENCOUNTER — Telehealth: Payer: Self-pay | Admitting: Cardiovascular Disease

## 2013-03-06 NOTE — Telephone Encounter (Signed)
Pt having side effects from med, taking xarelto, pls advise

## 2013-03-06 NOTE — Telephone Encounter (Signed)
pcp put him on xarelto, x 3 weeks, c/o extreme fatigue and off and on chest tightness since starting med, app for tomorrow, told to go to ED with further symptoms. Pt agreed to plan,

## 2013-03-07 ENCOUNTER — Ambulatory Visit (INDEPENDENT_AMBULATORY_CARE_PROVIDER_SITE_OTHER): Payer: Medicaid Other | Admitting: Cardiovascular Disease

## 2013-03-07 ENCOUNTER — Encounter: Payer: Self-pay | Admitting: Cardiovascular Disease

## 2013-03-07 VITALS — BP 124/78 | HR 70 | Ht >= 80 in | Wt 260.0 lb

## 2013-03-07 DIAGNOSIS — I480 Paroxysmal atrial fibrillation: Secondary | ICD-10-CM

## 2013-03-07 DIAGNOSIS — I4891 Unspecified atrial fibrillation: Secondary | ICD-10-CM

## 2013-03-07 MED ORDER — WARFARIN SODIUM 5 MG PO TABS: 7.5000 mg | ORAL_TABLET | Freq: Every day | ORAL | Status: DC

## 2013-03-07 NOTE — Assessment & Plan Note (Addendum)
He remains in NSR.  He wants to go back on coumadin instead of xarelto.    His previous dose was 10 mg each day with 12.5 mg on Mondays and Fridays.  We will have him discontinue the Xarelto and start Coumadin 7.5 a day.  Coumadin clinic in 6 days.

## 2013-03-07 NOTE — Progress Notes (Signed)
Johnny Sims Date of Birth  1955-08-28       [facility] [address]  [address] [place], [place]  [address]   [place], [place]  [address] [phone]     [phone]   Fax  [phone]    Fax [phone]  Problem List: 1. Paroxysmal atrial fibrillation 2. Hypothyroidism 3. Diabetes mellitus 4. Hyperlipidemia 5. Hypertension 6. Chronic pain  History of Present Illness:  Johnny Sims is a 57 yo who I have seen in the past for PAF.  His primary medical office is the [place]  office in [place].  He is on his third doctor in the past several years. He is not very  happy about this.  He's having lots of abdominal pain. He's had a poor appetite. He complains of being very thirsty and his reflux water. Also complains of polyuria. He states that his glucose levels have been well controlled.  He has having lots of generalized fatigue. She also is having trouble swallowing and chokes when he eats his cereal in the morning.  He denies any chest pain or shortness breath. He was seen in the emergency room in November, 2013 gallbladder pain. He was noted to be in normal sinus rhythm by EKG at that time.  September 26, 2012:  Johnny Sims has been doing well.  He would like to try Xarelto instead of coumadin.  He is doing well.  No CP.  He is having some problems with his Hep C.    Sept. 11, 2014:  Johnny Sims is seen back for eval of his PAF.  Has had a cough.  Still smoking.    Current Outpatient Prescriptions on File Prior to Visit  Medication Sig Dispense Refill  . amLODipine (NORVASC) 5 MG tablet Take 1 tablet (5 mg total) by mouth daily.  30 tablet  6  . cholecalciferol (VITAMIN D) 1000 UNITS tablet Take 1,000 Units by mouth once a week.        . diazepam (VALIUM) 10 MG tablet Take 10 mg by mouth every 6 (six) hours as needed. For anxiety      . fish oil-omega-3 fatty acids 1000 MG capsule Take 1 g by mouth daily.        . insulin glargine  (LANTUS) 100 units/mL SOLN Inject 22 Units into the skin. PEN      . levothyroxine (SYNTHROID, LEVOTHROID) 88 MCG tablet Take 88 mcg by mouth daily.        Marland Kitchen lisinopril (PRINIVIL,ZESTRIL) 40 MG tablet Take 40 mg by mouth 2 (two) times daily.        Marland Kitchen loratadine (CLARITIN) 10 MG tablet Take 10 mg by mouth daily.        . meclizine (ANTIVERT) 25 MG tablet Take 25 mg by mouth 3 (three) times daily as needed.        . metoprolol succinate (TOPROL-XL) 50 MG 24 hr tablet Take 1 tablet (50 mg total) by mouth daily. Take with or immediately following a meal.  30 tablet  6  . Multiple Vitamin (MULTIVITAMIN) tablet Take 1 tablet by mouth daily.        Marland Kitchen omeprazole (PRILOSEC) 40 MG capsule Take 40 mg by mouth daily.        Marland Kitchen oxycodone (ROXICODONE) 30 MG immediate release tablet Take 5 tablet daily for pain  30 tablet  0  . promethazine (PHENERGAN) 25 MG tablet Take 25 mg by  mouth every 6 (six) hours as needed. For nausea      . simvastatin (ZOCOR) 20 MG tablet Take 20 mg by mouth at bedtime.        . sucralfate (CARAFATE) 1 GM/10ML suspension Take 10 mLs (1 g total) by mouth 4 (four) times daily.  420 mL  0  . tiotropium (SPIRIVA) 18 MCG inhalation capsule Place 18 mcg into inhaler and inhale daily.        . vitamin C (ASCORBIC ACID) 500 MG tablet Take 500 mg by mouth daily.       No current facility-administered medications on file prior to visit.    Allergies  Allergen Reactions  . Antihistamines, Chlorpheniramine-Type   . Iodinated Diagnostic Agents   . Iodine   . Methadone   . Pentazocine Lactate   . Sulfa Antibiotics   . Vicodin [Hydrocodone-Acetaminophen]     Past Medical History  Diagnosis Date  . PAF (paroxysmal atrial fibrillation)   . Diabetes mellitus   . Hypothyroidism   . Hyperlipidemia   . Hypertension   . Chronic pain   . Melanoma   . OA (osteoarthritis)   . COPD (chronic obstructive pulmonary disease)   . Asthma   . RA (rheumatoid arthritis)   . Hepatitis C   . Venous  stasis   . Bipolar 1 disorder     Past Surgical History  Procedure Laterality Date  . Hernia repair    . Thyroidectomy    . Tonsillectomy    . US echocardiography  02/19/2010    EF 55-60%    History  Smoking status  . Current Every Day Smoker -- 1.00 packs/day  Smokeless tobacco  . Not on file    History  Alcohol Use No    Family History  Problem Relation Age of Onset  . Hypertension Mother   . Alzheimer's disease Mother   . Heart disease Father     Reviw of Systems:  Reviewed in the HPI.  All other systems are negative.  Physical Exam: Blood pressure 124/78, pulse 70, height 6\' 9"  (2.057 m), weight 260 lb (117.935 kg), SpO2 98.00%. General: Well developed, well nourished, in no acute distress.  Head: Normocephalic, atraumatic, sclera non-icteric, mucus membranes are moist,  Poor dentition  Neck: Supple. Carotids are 2 + without bruits. No JVD   Lungs: bilateral wheezing.    Heart: RR, soft systolic murmur.  Abdomen: Soft, non-tender, non-distended with normal bowel sounds.  Msk:  Strength and tone are normal   Extremities: No clubbing or cyanosis. No edema.  Chronic stasis changes.   Distal pedal pulses are 2+ and equal   Neuro: CN II - XII intact.  Alert and oriented X 3.   Psych:  He has a history of bipolar disease.  ECG:  November 5 , 2013 in the emergency room reveals normal sinus rhythm with a controlled ventricular response.  Assessment / Plan:

## 2013-03-07 NOTE — Patient Instructions (Addendum)
Your physician has recommended you make the following change in your medication:  STOP XARELTO START COUMADIN  TONIGHT 7.5 MG EACH EVENING Your physician recommends that you schedule a follow-up appointment in: NEXT WEEK THURSDAY WITH COUMADIN CLINIC  Your physician recommends that you schedule a follow-up appointment in: 6 MONTHS WITH DR Elease Hashimoto

## 2013-03-14 ENCOUNTER — Other Ambulatory Visit: Payer: Self-pay | Admitting: Cardiovascular Disease

## 2013-03-14 ENCOUNTER — Ambulatory Visit: Payer: Medicaid Other | Admitting: Pharmacist

## 2013-04-02 ENCOUNTER — Other Ambulatory Visit: Payer: Medicaid Other

## 2013-04-08 ENCOUNTER — Ambulatory Visit (INDEPENDENT_AMBULATORY_CARE_PROVIDER_SITE_OTHER): Payer: Medicaid Other | Admitting: Pharmacist

## 2013-04-08 DIAGNOSIS — I4891 Unspecified atrial fibrillation: Secondary | ICD-10-CM

## 2013-04-08 DIAGNOSIS — Z7901 Long term (current) use of anticoagulants: Secondary | ICD-10-CM

## 2013-04-08 DIAGNOSIS — I480 Paroxysmal atrial fibrillation: Secondary | ICD-10-CM

## 2013-04-09 ENCOUNTER — Ambulatory Visit
Admission: RE | Admit: 2013-04-09 | Discharge: 2013-04-09 | Disposition: A | Discharge status: Home / Self Care | Payer: Medicaid Other | Source: Ambulatory Visit | Attending: Internal Medicine | Admitting: Internal Medicine

## 2013-04-09 DIAGNOSIS — B182 Chronic viral hepatitis C: Secondary | ICD-10-CM

## 2013-04-16 ENCOUNTER — Other Ambulatory Visit: Payer: Medicaid Other

## 2013-05-13 ENCOUNTER — Other Ambulatory Visit: Payer: Self-pay | Admitting: Cardiovascular Disease

## 2013-06-27 DIAGNOSIS — K859 Acute pancreatitis without necrosis or infection, unspecified: Secondary | ICD-10-CM

## 2013-06-27 HISTORY — DX: Acute pancreatitis without necrosis or infection, unspecified: K85.90

## 2013-07-01 ENCOUNTER — Emergency Department (HOSPITAL_COMMUNITY)
Admission: EM | Admit: 2013-07-01 | Discharge: 2013-07-01 | Disposition: A | Discharge status: Home / Self Care | Payer: Medicaid Other | Attending: Emergency Medicine | Admitting: Emergency Medicine

## 2013-07-01 ENCOUNTER — Encounter (HOSPITAL_COMMUNITY): Payer: Self-pay | Admitting: Emergency Medicine

## 2013-07-01 DIAGNOSIS — E785 Hyperlipidemia, unspecified: Secondary | ICD-10-CM | POA: Insufficient documentation

## 2013-07-01 DIAGNOSIS — I10 Essential (primary) hypertension: Secondary | ICD-10-CM | POA: Insufficient documentation

## 2013-07-01 DIAGNOSIS — R1310 Dysphagia, unspecified: Secondary | ICD-10-CM | POA: Insufficient documentation

## 2013-07-01 DIAGNOSIS — Z9889 Other specified postprocedural states: Secondary | ICD-10-CM | POA: Insufficient documentation

## 2013-07-01 DIAGNOSIS — I4891 Unspecified atrial fibrillation: Secondary | ICD-10-CM | POA: Insufficient documentation

## 2013-07-01 DIAGNOSIS — Z8619 Personal history of other infectious and parasitic diseases: Secondary | ICD-10-CM | POA: Insufficient documentation

## 2013-07-01 DIAGNOSIS — R635 Abnormal weight gain: Secondary | ICD-10-CM | POA: Insufficient documentation

## 2013-07-01 DIAGNOSIS — Z7901 Long term (current) use of anticoagulants: Secondary | ICD-10-CM | POA: Insufficient documentation

## 2013-07-01 DIAGNOSIS — F17200 Nicotine dependence, unspecified, uncomplicated: Secondary | ICD-10-CM | POA: Insufficient documentation

## 2013-07-01 DIAGNOSIS — M069 Rheumatoid arthritis, unspecified: Secondary | ICD-10-CM | POA: Insufficient documentation

## 2013-07-01 DIAGNOSIS — J4489 Other specified chronic obstructive pulmonary disease: Secondary | ICD-10-CM | POA: Insufficient documentation

## 2013-07-01 DIAGNOSIS — Z8659 Personal history of other mental and behavioral disorders: Secondary | ICD-10-CM | POA: Insufficient documentation

## 2013-07-01 DIAGNOSIS — E119 Type 2 diabetes mellitus without complications: Secondary | ICD-10-CM | POA: Insufficient documentation

## 2013-07-01 DIAGNOSIS — R220 Localized swelling, mass and lump, head: Secondary | ICD-10-CM | POA: Insufficient documentation

## 2013-07-01 DIAGNOSIS — G8929 Other chronic pain: Secondary | ICD-10-CM | POA: Insufficient documentation

## 2013-07-01 DIAGNOSIS — M1990 Unspecified osteoarthritis, unspecified site: Secondary | ICD-10-CM | POA: Insufficient documentation

## 2013-07-01 DIAGNOSIS — M269 Dentofacial anomaly, unspecified: Secondary | ICD-10-CM | POA: Insufficient documentation

## 2013-07-01 DIAGNOSIS — J449 Chronic obstructive pulmonary disease, unspecified: Secondary | ICD-10-CM | POA: Insufficient documentation

## 2013-07-01 DIAGNOSIS — Z9089 Acquired absence of other organs: Secondary | ICD-10-CM | POA: Insufficient documentation

## 2013-07-01 DIAGNOSIS — Z794 Long term (current) use of insulin: Secondary | ICD-10-CM | POA: Insufficient documentation

## 2013-07-01 DIAGNOSIS — IMO0002 Reserved for concepts with insufficient information to code with codable children: Secondary | ICD-10-CM | POA: Insufficient documentation

## 2013-07-01 DIAGNOSIS — R221 Localized swelling, mass and lump, neck: Secondary | ICD-10-CM

## 2013-07-01 DIAGNOSIS — Z792 Long term (current) use of antibiotics: Secondary | ICD-10-CM | POA: Insufficient documentation

## 2013-07-01 DIAGNOSIS — M542 Cervicalgia: Secondary | ICD-10-CM | POA: Insufficient documentation

## 2013-07-01 DIAGNOSIS — Z85820 Personal history of malignant melanoma of skin: Secondary | ICD-10-CM | POA: Insufficient documentation

## 2013-07-01 DIAGNOSIS — Z79899 Other long term (current) drug therapy: Secondary | ICD-10-CM | POA: Insufficient documentation

## 2013-07-01 MED ORDER — CLINDAMYCIN HCL 300 MG PO CAPS
300.0000 mg | ORAL_CAPSULE | Freq: Three times a day (TID) | ORAL | Status: DC
Start: 2013-07-01 — End: 2013-07-24

## 2013-07-01 NOTE — ED Notes (Signed)
Pt complaining about the wait. Moved to a room with a stretcher.

## 2013-07-01 NOTE — ED Provider Notes (Signed)
CSN: [id number]     Arrival date & time 07/01/13  1112 History  This chart was scribed for Johnny Rack, PA-C, working with Richarda Blade, MD by Maree Erie, ED Scribe. This patient was seen in room TR10C/TR10C and the patient's care was started at 2:39 PM.    Chief Complaint  Patient presents with  . Abscess    Patient is a 58 y.o. male presenting with abscess. The history is provided by the patient. No language interpreter was used.  Abscess   HPI Comments: CHRISTROPHER GINTZ is a 58 y.o. male with a history of PAF, diabetes, hypothyroidism, HTN, hyperlipidemia, melanoma, OA, COPD, asthma, RA, hepatitis C, venous stasis and bipolar disorder who presents to the Emergency Department complaining of gradual onset left sided neck pain and associated swelling that began a week ago. He believes the pain started out due to a "fever blister in his mouth" on the left side. He describes the pain as stinging and throbbing. He states the pain radiates to his left ear. He states he can only eat soft foods due to the pain. He states he was prescribed Penicillin by his PCP, which has improved the swelling. He states that his PCP told him to come here to get checked out for his neck swelling. He reports recurrent night sweats. He also reports recent weight gain. He denies above baseline shortness of breath. He has a past surgical history of tonsillectomy, US echocardiography, thyroidectomy and hernia repair.   Patient currently on 30 mg oxycontin and requesting pain medication by name today "dilaudid".     Past Medical History  Diagnosis Date  . PAF (paroxysmal atrial fibrillation)   . Diabetes mellitus   . Hypothyroidism   . Hyperlipidemia   . Hypertension   . Chronic pain   . Melanoma   . OA (osteoarthritis)   . COPD (chronic obstructive pulmonary disease)   . Asthma   . RA (rheumatoid arthritis)   . Hepatitis C   . Venous stasis   . Bipolar 1 disorder   . Hepatitis C    Past Surgical History   Procedure Laterality Date  . Hernia repair    . Thyroidectomy    . Tonsillectomy    . US echocardiography  02/19/2010    EF 55-60%   Family History  Problem Relation Age of Onset  . Hypertension Mother   . Alzheimer's disease Mother   . Heart disease Father    History  Substance Use Topics  . Smoking status: Current Every Day Smoker -- 1.00 packs/day  . Smokeless tobacco: Not on file  . Alcohol Use: No    Review of Systems  HENT: Positive for facial swelling and trouble swallowing.   Respiratory: Negative for shortness of breath.   Musculoskeletal: Positive for neck pain.  All other systems reviewed and are negative.    Allergies  Antihistamines, chlorpheniramine-type; Iodinated diagnostic agents; Iodine; Methadone; Pentazocine lactate; Sulfa antibiotics; and Vicodin  Home Medications   Current Outpatient Rx  Name  Route  Sig  Dispense  Refill  . amLODipine (NORVASC) 5 MG tablet   Oral   Take 5 mg by mouth daily.         . cholecalciferol (VITAMIN D) 1000 UNITS tablet   Oral   Take 2,000 Units by mouth daily.          . diazepam (VALIUM) 10 MG tablet   Oral   Take 10 mg by mouth every 6 (six) hours as  needed. For anxiety         . fluticasone (FLONASE) 50 MCG/ACT nasal spray   Each Nare   Place 1 spray into both nostrils daily.         . insulin glargine (LANTUS) 100 units/mL SOLN   Subcutaneous   Inject 22 Units into the skin at bedtime. PEN         . levothyroxine (SYNTHROID, LEVOTHROID) 88 MCG tablet   Oral   Take 88 mcg by mouth daily.           Marland Kitchen lisinopril (PRINIVIL,ZESTRIL) 40 MG tablet   Oral   Take 40 mg by mouth 2 (two) times daily.          Marland Kitchen loratadine (CLARITIN) 10 MG tablet   Oral   Take 10 mg by mouth daily.           . methocarbamol (ROBAXIN) 500 MG tablet   Oral   Take 500 mg by mouth 3 (three) times daily as needed for muscle spasms.         . metoprolol succinate (TOPROL-XL) 50 MG 24 hr tablet   Oral   Take  1 tablet (50 mg total) by mouth daily. Take with or immediately following a meal.   30 tablet   6   . Multiple Vitamin (MULTIVITAMIN) tablet   Oral   Take 1 tablet by mouth daily.           Marland Kitchen omeprazole (PRILOSEC) 40 MG capsule   Oral   Take 40 mg by mouth daily.           Marland Kitchen oxycodone (ROXICODONE) 30 MG immediate release tablet   Oral   Take 30 mg by mouth 5 (five) times daily.    30 tablet   0     Historical med only   . penicillin v potassium (VEETID) 500 MG tablet   Oral   Take 500 mg by mouth 4 (four) times daily.         . promethazine (PHENERGAN) 25 MG tablet   Oral   Take 25 mg by mouth every 6 (six) hours as needed. For nausea         . simvastatin (ZOCOR) 20 MG tablet   Oral   Take 20 mg by mouth at bedtime.           Marland Kitchen tiotropium (SPIRIVA) 18 MCG inhalation capsule   Inhalation   Place 18 mcg into inhaler and inhale daily.           Marland Kitchen warfarin (COUMADIN) 10 MG tablet   Oral   Take 10 mg by mouth See admin instructions. Take 10mg  on Mon, Wed, and Fri. Take 7.5mg  tablets all other days         . warfarin (COUMADIN) 7.5 MG tablet   Oral   Take 7.5 mg by mouth See admin instructions. Take on Sun, Tues, Thurs and Sat. Take 10mg  tablets all other days         . clindamycin (CLEOCIN) 300 MG capsule   Oral   Take 1 capsule (300 mg total) by mouth 3 (three) times daily. May dispense as 150mg  capsules   30 capsule   0    Triage Vitals: BP 153/82  Pulse 79  Temp(Src) 97.9 F (36.6 C) (Oral)  Resp 19  Wt 265 lb (120.203 kg)  SpO2 98%  Physical Exam  Nursing note and vitals reviewed. Constitutional: He is oriented to person, place, and time. He  appears well-developed and well-nourished. No distress.  HENT:  Head: Normocephalic and atraumatic.  Right Ear: Tympanic membrane and ear canal normal.  Left Ear: Tympanic membrane and ear canal normal.  Nose: Nose normal.  Mouth/Throat: Oropharynx is clear and moist and mucous membranes are  normal. Mucous membranes are not pale, not dry and not cyanotic. He does not have dentures. No oral lesions. No trismus in the jaw. Abnormal dentition. No uvula swelling or lacerations. No oropharyngeal exudate, posterior oropharyngeal edema, posterior oropharyngeal erythema or tonsillar abscesses.  Multiple > 1/2 of adult teeth absent.   Eyes: Conjunctivae and EOM are normal. Pupils are equal, round, and reactive to light. No scleral icterus.  Neck: Trachea normal, normal range of motion, full passive range of motion without pain and phonation normal. Neck supple. No tracheal tenderness present. Carotid bruit is not present. No tracheal deviation present. No mass and no thyromegaly present.    Cardiovascular: Normal rate and regular rhythm.  Exam reveals no gallop and no friction rub.   No murmur heard. Pulmonary/Chest: Effort normal and breath sounds normal. No stridor. No respiratory distress. He has no wheezes. He has no rales.  Musculoskeletal: Normal range of motion. He exhibits no edema.  Neurological: He is alert and oriented to person, place, and time.  Skin: Skin is warm and dry. He is not diaphoretic.  Psychiatric: He has a normal mood and affect. His behavior is normal.    ED Course  Procedures (including critical care time)  DIAGNOSTIC STUDIES: Oxygen Saturation is 98% on room air, normal by my interpretation.    COORDINATION OF CARE: 2:45 PM - Patient verbalizes understanding and agrees with treatment plan.    Labs Review Labs Reviewed - No data to display Imaging Review No results found.  EKG Interpretation   None       MDM   1. Neck mass    Patient afebrile, in NAD, with stable vital signs. No signs of Ludwigs angina. No signs of respiratory compromise.   Contacted patient's PCP, Dr. Horald Pollen. She reports seeing patient regularly for chronic pain control. Last seen on 06/11/14 and prescribed 150 count 30mg  Oxycontin q 5 hours (30 day supply). Does not  mention having seen patient for current neck mass, though agrees to have him follow up for additional workup. Informed Dr. Darron Doom of plan today to change patient's antibiotic to Cleocin for broader coverage, and she agrees with plan. She advised against any pain medication to be given to patient as he has abused his privileges in the past and is currently on a pain contract.   I discussed this conversation with the patient. Patient seems a dissapointed that I do not plan to treat his pain today, though still very polite and thanks me for my help.  Advised follow up with  PCP for chronic pain. I have also provided patient with "on call" ENT specialist for follow up per his request. Take medications as prescribed. Patient agrees with plan. Discharged in good condition  Meds given in ED:  Medications - No data to display  Discharge Medication List as of 07/01/2013  3:56 PM    START taking these medications   Details  clindamycin (CLEOCIN) 300 MG capsule Take 1 capsule (300 mg total) by mouth 3 (three) times daily. May dispense as 150mg  capsules, Starting 07/01/2013, Until Discontinued, Print           I personally performed the services described in this documentation, which was scribed in my presence.  The recorded information has been reviewed and is accurate.    Isaid Salvia, PA-C 07/02/13 0025

## 2013-07-01 NOTE — Discharge Instructions (Signed)
I spoke with your PCP, who has agreed to see you for follow up on the neck mass. You will also need to refer to your PCP for your chronic pain. I have also provided the "on call"  Ear/Nost/Throat specialist for follow up if you would rather have a specialist. Take medications as prescribed.

## 2013-07-01 NOTE — ED Provider Notes (Signed)
  Face-to-face evaluation   History: Patient here with painful swelling in left neck. Started after he noticed an ulceration on his tongue. He is taking penicillin for a leg  Problem. He is using oxycodone 30 mg, without relief. He believes that Dilaudid would help his discomfort.  Physical exam: Neck: The thyroid is palpable and feels normal. It is small and without palpable nodularity. There is shotty adenopathy at the left angle of the jaw and extending along the upper sternocleidomastoid muscle. These lymph nodes are somewhat enlarged at 0.5-1 cm, and tender to palpation.  Medical screening examination/treatment/procedure(s) were conducted as a shared visit with non-physician practitioner(s) and myself.  I personally evaluated the patient during the encounter  Richarda Blade, MD 07/03/13 1650

## 2013-07-01 NOTE — ED Notes (Signed)
Pt currently gone outside to smoke according to people in the lobby.

## 2013-07-01 NOTE — ED Notes (Signed)
Pt did not answer x 1 

## 2013-07-01 NOTE — ED Notes (Signed)
Swelling in left neck x 1 week. States had his "right thyroid removed". PCP is Horald Pollen.

## 2013-07-01 NOTE — ED Notes (Signed)
Pt here for tear on left tongue and knot to chin, pt reports onset last Monday. Pt sts it feels like his glands are gone.

## 2013-07-02 ENCOUNTER — Telehealth: Payer: Self-pay | Admitting: Cardiovascular Disease

## 2013-07-02 NOTE — Telephone Encounter (Signed)
New message      Pt was seen in the er yesterday they said he needs to f/u asap. No openings can you work him in?

## 2013-07-02 NOTE — Telephone Encounter (Signed)
Chart/ ED report reviewed, pt was to f/u with pcp for neck mass. No mention of CP or cardiac problem. msg was left that per report he was to call pcp and to call back if cardiology help is needed. Call back number provided.

## 2013-07-15 ENCOUNTER — Telehealth: Payer: Self-pay | Admitting: Pharmacist

## 2013-07-15 ENCOUNTER — Telehealth: Payer: Self-pay | Admitting: *Deleted

## 2013-07-15 ENCOUNTER — Ambulatory Visit (INDEPENDENT_AMBULATORY_CARE_PROVIDER_SITE_OTHER): Payer: Medicaid Other | Admitting: Pharmacist

## 2013-07-15 DIAGNOSIS — Z7901 Long term (current) use of anticoagulants: Secondary | ICD-10-CM

## 2013-07-15 DIAGNOSIS — I4891 Unspecified atrial fibrillation: Secondary | ICD-10-CM

## 2013-07-15 DIAGNOSIS — I480 Paroxysmal atrial fibrillation: Secondary | ICD-10-CM

## 2013-07-15 LAB — POCT INR: INR: 2.2

## 2013-07-15 MED ORDER — WARFARIN SODIUM 5 MG PO TABS: 5.0000 mg | ORAL_TABLET | Freq: Every day | ORAL | Status: DC

## 2013-07-15 NOTE — Telephone Encounter (Signed)
Message copied by Jonathon Jordan on Mon Jul 15, 2013 12:54 PM ------      Message from: Thayer Headings      Created: Mon Jul 15, 2013 11:33 AM       Mr. Cottam may hold his coumadin for 5 days prior to surgical procedure.            He is at low risk for CV complications during procedure.                   Thayer Headings, Brooke Bonito., MD, [facility]      07/15/2013, 11:34 AM      Office - [phone]      Pager [phone]                        ----- Message -----         From: Aris Georgia, [facility]         Sent: 07/15/2013  10:18 AM           To: Thayer Headings, MD            Pt is planning to have a mass removal from neck by Dr. Redmond Baseman.  Procedure date not set yet but pt needs clearance to hold Coumadin.  Please advise.  Will fax clearance to [facility] at [phone].       ------

## 2013-07-15 NOTE — Telephone Encounter (Signed)
Noted/ will take to MR to be faxed.

## 2013-07-15 NOTE — Telephone Encounter (Signed)
Message copied by Aris Georgia on Mon Jul 15, 2013 12:21 PM ------      Message from: Thayer Headings      Created: Mon Jul 15, 2013 11:33 AM       Mr. Deman may hold his coumadin for 5 days prior to surgical procedure.            He is at low risk for CV complications during procedure.                   Thayer Headings, Brooke Bonito., MD, [facility]      07/15/2013, 11:34 AM      Office - [phone]      Pager [phone]                        ----- Message -----         From: Aris Georgia, [facility]         Sent: 07/15/2013  10:18 AM           To: Thayer Headings, MD            Pt is planning to have a mass removal from neck by Dr. Redmond Baseman.  Procedure date not set yet but pt needs clearance to hold Coumadin.  Please advise.  Will fax clearance to [facility] at [phone].       ------

## 2013-07-15 NOTE — Telephone Encounter (Signed)
Clearance faxed to Dr. Redmond Baseman office.

## 2013-07-18 ENCOUNTER — Other Ambulatory Visit: Payer: Self-pay | Admitting: Cardiovascular Disease

## 2013-07-24 ENCOUNTER — Encounter (HOSPITAL_COMMUNITY): Payer: Self-pay | Admitting: Pharmacy Technician

## 2013-07-29 ENCOUNTER — Other Ambulatory Visit (HOSPITAL_COMMUNITY): Payer: Self-pay | Admitting: Otolaryngology

## 2013-07-31 NOTE — Addendum Note (Signed)
Addended by: Melida Quitter D on: 07/31/2013 12:38 PM   Modules accepted: Orders

## 2013-08-01 ENCOUNTER — Inpatient Hospital Stay (HOSPITAL_COMMUNITY)
Admission: RE | Admit: 2013-08-01 | Discharge: 2013-08-01 | Disposition: A | Discharge status: Home / Self Care | Payer: Medicaid Other | Source: Ambulatory Visit

## 2013-08-01 NOTE — Pre-Procedure Instructions (Addendum)
Meyer B Picklesimer  08/01/2013   Your procedure is scheduled on:  Wednesday, February 11.  Report to [facility], Main Entrance / Entrance "A" at 6:30AM.  Call this number if you have problems the morning of surgery: [phone]   Remember:   Do not eat food or drink liquids after midnight.   Take these medicines the morning of surgery with A SIP OF WATER: amLODipine (NORVASC), levothyroxine (SYNTHROID, LEVOTHROID), metoprolol succinate (TOPROL-XL), omeprazole (PRILOSEC). Use Flonase and tiotropium (SPIRIVA) inhaler.  Stop Coumadin for 5 days prior to surgical procedure . Hold 08/02/2013 . Take if needed: oxycodone (ROXICODONE), promethazine (PHENERGAN),  methocarbamol (ROBAXIN), loratadine (CLARITIN), diazepam (VALIUM) .  Do not wear jewelry, make-up or nail polish.  Do not wear lotions, powders, or perfumes. You may wear deodorant.   Men may shave face and neck.  Do not bring valuables to the hospital.  [facility] is not responsible for any belongings or valuables.               Contacts, dentures or bridgework may not be worn into surgery.  Leave suitcase in the car. After surgery it may be brought to your room.  For patients admitted to the hospital, discharge time is determined by your treatment team.               Patients discharged the day of surgery will not be allowed to drive home.  Name and phone number of your driver: -   Special Instructions: Shower with  Surgical Scrub,CHG wash (Bactoshield) tonight and again in the am prior to arriving to hospital.   Please read over the following fact sheets that you were given: Pain Booklet, Coughing and Deep Breathing and Surgical Site Infection Prevention   [facility] - Preparing for Surgery  Before surgery, you can play an important role.  Because skin is not sterile, your skin needs to be as free of germs as possible.  You can reduce the number of germs on you skin by washing with CHG (chlorahexidine gluconate) soap before  surgery.  CHG is an antiseptic cleaner which kills germs and bonds with the skin to continue killing germs even after washing.  Please DO NOT use if you have an allergy to CHG or antibacterial soaps.  If your skin becomes reddened/irritated stop using the CHG and inform your nurse when you arrive at Short Stay.  Do not shave (including legs and underarms) for at least 48 hours prior to the first CHG shower.  You may shave your face.  Please follow these instructions carefully:   1.  Shower with CHG Soap the night before surgery and the   morning of Surgery.  2.  If you choose to wash your hair, wash your hair first as usual with your normal shampoo.  3.  After you shampoo, rinse your hair and body thoroughly to remove the  Shampoo.  4.  Use CHG as you would any other liquid soap.  You can apply chg directly  to the skin and wash gently with scrungie or a clean washcloth.  5.  Apply the CHG Soap to your body ONLY FROM THE NECK DOWN.  Do not use on open wounds or open sores.  Avoid contact with your eyes ears, mouth and genitals (private parts).  Wash genitals (private parts) with your normal soap.  6.  Wash thoroughly, paying special attention to the area where your surgery  will be performed.  7.  Thoroughly rinse your body  with warm water from the neck down.  8.  DO NOT shower/wash with your normal soap after using and rinsing off the CHG Soap.  9.  Pat yourself dry with a clean towel.            10.  Wear clean pajamas.            11.  Place clean sheets on your bed the night of your first shower and do not sleep with pets.  Day of Surgery  Do not apply any lotions/deoderants the morning of surgery.  Please wear clean clothes to the hospital/surgery center.

## 2013-08-13 NOTE — Progress Notes (Signed)
Called pt for pre-op phone call and he states he's not having surgery tomorrow, states he's had diarrhea for 29 days. I asked him if he had called Dr. Redmond Baseman' office and he said his doctor should have. I told him it would be good for him to call Dr. Redmond Baseman' office and make sure that they know this because he is still on the surgery schedule. He states he will call them right now. He then wanted to know when he could come in for his blood work next week. I told him that once his surgery is rescheduled, someone would call him and schedule him a time for his PAT appt. He voiced understanding.

## 2013-08-13 NOTE — Progress Notes (Signed)
Pt called this afternoon and stated that he has tried several time to contact Dr. Redmond Baseman' office and they are closed due to inclement weather. I had also tried calling numerous times and it only rang busy. I told pt that I would try and get in touch with Dr. Redmond Baseman. I called the OR and they stated they had not seen him all day, they gave me his beeper # and I paged him. He responded and I gave him the information that the patient is cancelling his surgery.

## 2013-08-14 ENCOUNTER — Encounter (HOSPITAL_COMMUNITY): Admission: RE | Payer: Self-pay | Source: Ambulatory Visit

## 2013-08-14 ENCOUNTER — Ambulatory Visit (HOSPITAL_COMMUNITY): Admission: RE | Admit: 2013-08-14 | Payer: Medicaid Other | Source: Ambulatory Visit | Admitting: Otolaryngology

## 2013-08-14 SURGERY — EXCISION, SUBMANDIBULAR GLAND
Anesthesia: General | Laterality: Left

## 2013-09-02 ENCOUNTER — Encounter (HOSPITAL_COMMUNITY): Payer: Self-pay | Admitting: Emergency Medicine

## 2013-09-02 DIAGNOSIS — F17200 Nicotine dependence, unspecified, uncomplicated: Secondary | ICD-10-CM | POA: Insufficient documentation

## 2013-09-02 DIAGNOSIS — Z8739 Personal history of other diseases of the musculoskeletal system and connective tissue: Secondary | ICD-10-CM | POA: Insufficient documentation

## 2013-09-02 DIAGNOSIS — Z8659 Personal history of other mental and behavioral disorders: Secondary | ICD-10-CM | POA: Insufficient documentation

## 2013-09-02 DIAGNOSIS — I4891 Unspecified atrial fibrillation: Secondary | ICD-10-CM | POA: Insufficient documentation

## 2013-09-02 DIAGNOSIS — E119 Type 2 diabetes mellitus without complications: Secondary | ICD-10-CM | POA: Insufficient documentation

## 2013-09-02 DIAGNOSIS — R109 Unspecified abdominal pain: Secondary | ICD-10-CM | POA: Insufficient documentation

## 2013-09-02 DIAGNOSIS — I10 Essential (primary) hypertension: Secondary | ICD-10-CM | POA: Insufficient documentation

## 2013-09-02 DIAGNOSIS — R197 Diarrhea, unspecified: Secondary | ICD-10-CM | POA: Insufficient documentation

## 2013-09-02 DIAGNOSIS — R110 Nausea: Secondary | ICD-10-CM | POA: Insufficient documentation

## 2013-09-02 DIAGNOSIS — Z792 Long term (current) use of antibiotics: Secondary | ICD-10-CM | POA: Insufficient documentation

## 2013-09-02 DIAGNOSIS — Z794 Long term (current) use of insulin: Secondary | ICD-10-CM | POA: Insufficient documentation

## 2013-09-02 DIAGNOSIS — J4489 Other specified chronic obstructive pulmonary disease: Secondary | ICD-10-CM | POA: Insufficient documentation

## 2013-09-02 DIAGNOSIS — Z7901 Long term (current) use of anticoagulants: Secondary | ICD-10-CM | POA: Insufficient documentation

## 2013-09-02 DIAGNOSIS — G8929 Other chronic pain: Secondary | ICD-10-CM | POA: Insufficient documentation

## 2013-09-02 DIAGNOSIS — Z79899 Other long term (current) drug therapy: Secondary | ICD-10-CM | POA: Insufficient documentation

## 2013-09-02 DIAGNOSIS — Z8619 Personal history of other infectious and parasitic diseases: Secondary | ICD-10-CM | POA: Insufficient documentation

## 2013-09-02 DIAGNOSIS — J449 Chronic obstructive pulmonary disease, unspecified: Secondary | ICD-10-CM | POA: Insufficient documentation

## 2013-09-02 LAB — CBC WITH DIFFERENTIAL/PLATELET
Basophils Absolute: 0 10*3/uL (ref 0.0–0.1)
Basophils Relative: 0 % (ref 0–1)
EOS ABS: 0.2 10*3/uL (ref 0.0–0.7)
Eosinophils Relative: 1 % (ref 0–5)
HCT: 42 % (ref 39.0–52.0)
Hemoglobin: 15 g/dL (ref 13.0–17.0)
LYMPHS ABS: 2.7 10*3/uL (ref 0.7–4.0)
LYMPHS PCT: 18 % (ref 12–46)
MCH: 30 pg (ref 26.0–34.0)
MCHC: 35.7 g/dL (ref 30.0–36.0)
MCV: 84 fL (ref 78.0–100.0)
Monocytes Absolute: 1.3 10*3/uL — ABNORMAL HIGH (ref 0.1–1.0)
Monocytes Relative: 8 % (ref 3–12)
NEUTROS PCT: 73 % (ref 43–77)
Neutro Abs: 11.4 10*3/uL — ABNORMAL HIGH (ref 1.7–7.7)
Platelets: 222 10*3/uL (ref 150–400)
RBC: 5 MIL/uL (ref 4.22–5.81)
RDW: 13.2 % (ref 11.5–15.5)
WBC: 15.5 10*3/uL — AB (ref 4.0–10.5)

## 2013-09-02 LAB — URINE MICROSCOPIC-ADD ON

## 2013-09-02 LAB — COMPREHENSIVE METABOLIC PANEL
ALBUMIN: 3.3 g/dL — AB (ref 3.5–5.2)
ALT: 24 U/L (ref 0–53)
AST: 22 U/L (ref 0–37)
Alkaline Phosphatase: 69 U/L (ref 39–117)
BUN: 8 mg/dL (ref 6–23)
CO2: 23 meq/L (ref 19–32)
Calcium: 9.1 mg/dL (ref 8.4–10.5)
Chloride: 98 mEq/L (ref 96–112)
Creatinine, Ser: 0.86 mg/dL (ref 0.50–1.35)
GFR calc Af Amer: >90 mL/min (ref >90)
GFR calc non Af Amer: >90 mL/min (ref >90)
Glucose, Bld: 184 mg/dL — ABNORMAL HIGH (ref 70–99)
Potassium: 4.3 mEq/L (ref 3.7–5.3)
Sodium: 134 mEq/L — ABNORMAL LOW (ref 137–147)
TOTAL PROTEIN: 6.9 g/dL (ref 6.0–8.3)
Total Bilirubin: 0.3 mg/dL (ref 0.3–1.2)

## 2013-09-02 LAB — URINALYSIS, ROUTINE W REFLEX MICROSCOPIC
GLUCOSE, UA: NEGATIVE mg/dL
Ketones, ur: 15 mg/dL — AB
LEUKOCYTES UA: NEGATIVE
Nitrite: NEGATIVE
Protein, ur: >300 mg/dL — AB
Specific Gravity, Urine: 1.029 (ref 1.005–1.030)
Urobilinogen, UA: 0.2 mg/dL (ref 0.0–1.0)
pH: 6 (ref 5.0–8.0)

## 2013-09-02 NOTE — ED Notes (Signed)
Patient with abdominal pain that started last night, with nausea, no vomiting.  Patient states that the pain is sharp in nature, like a butcher knife.

## 2013-09-03 ENCOUNTER — Emergency Department (HOSPITAL_COMMUNITY): Payer: Medicaid Other

## 2013-09-03 ENCOUNTER — Emergency Department (HOSPITAL_COMMUNITY)
Admission: EM | Admit: 2013-09-03 | Discharge: 2013-09-03 | Disposition: A | Discharge status: Home / Self Care | Payer: Medicaid Other | Attending: Emergency Medicine | Admitting: Emergency Medicine

## 2013-09-03 DIAGNOSIS — G8929 Other chronic pain: Secondary | ICD-10-CM

## 2013-09-03 DIAGNOSIS — R109 Unspecified abdominal pain: Secondary | ICD-10-CM

## 2013-09-03 LAB — LIPASE, BLOOD: LIPASE: 31 U/L (ref 11–59)

## 2013-09-03 LAB — PROTIME-INR
INR: 1.53 — ABNORMAL HIGH (ref 0.00–1.49)
PROTHROMBIN TIME: 18 s — AB (ref 11.6–15.2)

## 2013-09-03 LAB — I-STAT TROPONIN, ED: Troponin i, poc: 0 ng/mL (ref 0.00–0.08)

## 2013-09-03 MED ORDER — HYDROMORPHONE HCL PF 1 MG/ML IJ SOLN
1.0000 mg | Freq: Once | INTRAMUSCULAR | Status: AC
  Administered 2013-09-03: 1 mg via INTRAVENOUS
  Filled 2013-09-03: qty 1

## 2013-09-03 MED ORDER — METOPROLOL TARTRATE 1 MG/ML IV SOLN
5.0000 mg | Freq: Once | INTRAVENOUS | Status: AC
  Administered 2013-09-03: 5 mg via INTRAVENOUS
  Filled 2013-09-03: qty 5

## 2013-09-03 MED ORDER — SODIUM CHLORIDE 0.9 % IV BOLUS (SEPSIS)
1000.0000 mL | Freq: Once | INTRAVENOUS | Status: AC
  Administered 2013-09-03: 1000 mL via INTRAVENOUS

## 2013-09-03 MED ORDER — OXYCODONE HCL 5 MG PO TABS
30.0000 mg | ORAL_TABLET | Freq: Once | ORAL | Status: AC
  Administered 2013-09-03: 30 mg via ORAL
  Filled 2013-09-03: qty 6

## 2013-09-03 MED ORDER — ONDANSETRON HCL 4 MG/2ML IJ SOLN
4.0000 mg | Freq: Once | INTRAMUSCULAR | Status: AC
  Administered 2013-09-03: 4 mg via INTRAVENOUS
  Filled 2013-09-03: qty 2

## 2013-09-03 NOTE — ED Notes (Signed)
Patient states that the pain remains at the same level.

## 2013-09-03 NOTE — ED Notes (Signed)
Vital signs stable. 

## 2013-09-03 NOTE — ED Notes (Signed)
Patient transported to [facility] 

## 2013-09-03 NOTE — ED Provider Notes (Signed)
CSN: [id number]     Arrival date & time 09/02/13  2230 History   First MD Initiated Contact with Patient 09/03/13 0045     Chief Complaint  Patient presents with  . Abdominal Pain     (Consider location/radiation/quality/duration/timing/severity/associated sxs/prior Treatment) HPI Patient presents with abdominal pain. He states his pain is been episodic for an extended period of time. He is yet to see a gastroenterologist. He states last evening he began having sharp upper abdominal associated with nausea he has no vomiting. The pain does not radiate. He's been taking his oxycodone at home with minimal relief. He denies constipation. He has had several loose stools today. There is no evidence of any blood or melena. Past Medical History  Diagnosis Date  . PAF (paroxysmal atrial fibrillation)   . Diabetes mellitus   . Hypothyroidism   . Hyperlipidemia   . Hypertension   . Chronic pain   . Melanoma   . OA (osteoarthritis)   . COPD (chronic obstructive pulmonary disease)   . Asthma   . RA (rheumatoid arthritis)   . Hepatitis C   . Venous stasis   . Bipolar 1 disorder   . Hepatitis C    Past Surgical History  Procedure Laterality Date  . Hernia repair    . Thyroidectomy    . Tonsillectomy    . US echocardiography  02/19/2010    EF 55-60%   Family History  Problem Relation Age of Onset  . Hypertension Mother   . Alzheimer's disease Mother   . Heart disease Father    History  Substance Use Topics  . Smoking status: Current Every Day Smoker -- 1.00 packs/day  . Smokeless tobacco: Not on file  . Alcohol Use: No    Review of Systems  Constitutional: Negative for fever and chills.  Respiratory: Negative for shortness of breath.   Cardiovascular: Negative for chest pain.  Gastrointestinal: Positive for nausea, abdominal pain and diarrhea. Negative for vomiting, constipation and blood in stool.  Genitourinary: Negative for dysuria and flank pain.  Musculoskeletal: Negative  for back pain, neck pain and neck stiffness.  Skin: Negative for wound.  Neurological: Negative for dizziness, weakness, light-headedness, numbness and headaches.  All other systems reviewed and are negative.      Allergies  Antihistamines, chlorpheniramine-type; Iodinated diagnostic agents; Iodine; Methadone; Oxycodone hcl; Pentazocine lactate; Sulfa antibiotics; and Vicodin  Home Medications   Current Outpatient Rx  Name  Route  Sig  Dispense  Refill  . amLODipine (NORVASC) 5 MG tablet   Oral   Take 5 mg by mouth daily.         . cholecalciferol (VITAMIN D) 1000 UNITS tablet   Oral   Take 1,000 Units by mouth every evening.          . diazepam (VALIUM) 10 MG tablet   Oral   Take 10 mg by mouth 4 (four) times daily as needed. For anxiety         . fluticasone (FLONASE) 50 MCG/ACT nasal spray   Each Nare   Place 1 spray into both nostrils daily as needed for allergies.          Marland Kitchen insulin glargine (LANTUS) 100 units/mL SOLN   Subcutaneous   Inject 25 Units into the skin at bedtime. PEN         . levothyroxine (SYNTHROID, LEVOTHROID) 88 MCG tablet   Oral   Take 88 mcg by mouth daily.          Marland Kitchen  lisinopril (PRINIVIL,ZESTRIL) 40 MG tablet   Oral   Take 40 mg by mouth daily.          Marland Kitchen loratadine (CLARITIN) 10 MG tablet   Oral   Take 10 mg by mouth daily as needed for allergies.          . methocarbamol (ROBAXIN) 500 MG tablet   Oral   Take 500 mg by mouth at bedtime as needed for muscle spasms.          . metoprolol succinate (TOPROL-XL) 50 MG 24 hr tablet   Oral   Take 1 tablet (50 mg total) by mouth daily. Take with or immediately following a meal.   30 tablet   6   . Multiple Vitamin (MULTIVITAMIN) tablet   Oral   Take 1 tablet by mouth every evening.          Marland Kitchen omeprazole (PRILOSEC) 40 MG capsule   Oral   Take 40 mg by mouth daily.          Marland Kitchen oxycodone (ROXICODONE) 30 MG immediate release tablet   Oral   Take 30-60 mg by mouth  4 (four) times daily. Take 2 tablets in the morning and 1 tablet at lunch dinner and bedtime   30 tablet   0     Historical med only   . penicillin v potassium (VEETID) 500 MG tablet   Oral   Take 500 mg by mouth 4 (four) times daily as needed (for flare up in legs).          . promethazine (PHENERGAN) 25 MG tablet   Oral   Take 25 mg by mouth every 6 (six) hours as needed. For nausea         . simvastatin (ZOCOR) 20 MG tablet   Oral   Take 20 mg by mouth every evening.          . sucralfate (CARAFATE) 1 GM/10ML suspension   Oral   Take 1 g by mouth 3 (three) times daily as needed (for stomach pain).         Marland Kitchen tiotropium (SPIRIVA) 18 MCG inhalation capsule   Inhalation   Place 18 mcg into inhaler and inhale daily.          Marland Kitchen warfarin (COUMADIN) 10 MG tablet   Oral   Take 10 mg by mouth 3 (three) times a week. On Monday, Wednesday, and Friday         . warfarin (COUMADIN) 7.5 MG tablet   Oral   Take 7.5 mg by mouth 4 (four) times a week. On Sunday, Tuesday, Thursday, and Saturday          BP 156/79  Pulse 90  Temp(Src) 98.2 F (36.8 C) (Oral)  Resp 18  Ht 6\' 9"  (2.057 m)  Wt 287 lb 12.8 oz (130.545 kg)  BMI 30.85 kg/m2  SpO2 98% Physical Exam  Nursing note and vitals reviewed. Constitutional: He is oriented to person, place, and time. He appears well-developed and well-nourished. No distress.  HENT:  Head: Normocephalic and atraumatic.  Mouth/Throat: Oropharynx is clear and moist. No oropharyngeal exudate.  Eyes: EOM are normal. Pupils are equal, round, and reactive to light.  Neck: Normal range of motion. Neck supple.  Cardiovascular: Normal rate and regular rhythm.   Pulmonary/Chest: Effort normal and breath sounds normal. No respiratory distress. He has no wheezes. He has no rales. He exhibits no tenderness.  Abdominal: Soft. Bowel sounds are normal. He exhibits no distension  and no mass. There is tenderness (tenderness to palpation in the right  upper and right lower quadrants. There is no rebound or guarding.). There is no rebound and no guarding.  Musculoskeletal: Normal range of motion. He exhibits no edema and no tenderness.  Neurological: He is alert and oriented to person, place, and time.  Moves all extremities without deficit. Sensation is grossly intact.  Skin: Skin is warm and dry. No rash noted. No erythema.  Psychiatric: He has a normal mood and affect. His behavior is normal.    ED Course  Procedures (including critical care time) Labs Review Labs Reviewed  CBC WITH DIFFERENTIAL - Abnormal; Notable for the following:    WBC 15.5 (*)    Neutro Abs 11.4 (*)    Monocytes Absolute 1.3 (*)    All other components within normal limits  URINALYSIS, ROUTINE W REFLEX MICROSCOPIC - Abnormal; Notable for the following:    Color, Urine AMBER (*)    Hgb urine dipstick MODERATE (*)    Bilirubin Urine SMALL (*)    Ketones, ur 15 (*)    Protein, ur >300 (*)    All other components within normal limits  COMPREHENSIVE METABOLIC PANEL - Abnormal; Notable for the following:    Sodium 134 (*)    Glucose, Bld 184 (*)    Albumin 3.3 (*)    All other components within normal limits  URINE MICROSCOPIC-ADD ON - Abnormal; Notable for the following:    Bacteria, UA FEW (*)    Casts HYALINE CASTS (*)    All other components within normal limits  PROTIME-INR - Abnormal; Notable for the following:    Prothrombin Time 18.0 (*)    INR 1.53 (*)    All other components within normal limits  LIPASE, BLOOD  I-STAT TROPOININ, ED   Imaging Review No results found.   EKG Interpretation None      MDM   Final diagnoses:  None   Discuss CT findings with Dr. Donne Hazel. Request ultrasound. Will see patient in emergency department. Patient signed out to the oncoming emergency physician pending ultrasound and surgery evaluation.     Julianne Rice, MD 09/03/13 [phone]

## 2013-09-03 NOTE — ED Notes (Signed)
Spoke with Dr. Eulis Foster, after reviewing patient's history r/t paroxysmal afib, he feels patient is stable to go home

## 2013-09-03 NOTE — Discharge Instructions (Signed)
Chronic Pain Chronic pain can be defined as pain that is off and on and lasts for 3 6 months or longer. Many things cause chronic pain, which can make it difficult to make a diagnosis. There are many treatment options available for chronic pain. However, finding a treatment that works well for you may require trying various approaches until the right one is found. Many people benefit from a combination of two or more types of treatment to control their pain. SYMPTOMS  Chronic pain can occur anywhere in the body and can range from mild to very severe. Some types of chronic pain include:  Headache.  Low back pain.  Cancer pain.  Arthritis pain.  Neurogenic pain. This is pain resulting from damage to nerves. People with chronic pain may also have other symptoms such as:  Depression.  Anger.  Insomnia.  Anxiety. DIAGNOSIS  Your health care provider will help diagnose your condition over time. In many cases, the initial focus will be on excluding possible conditions that could be causing the pain. Depending on your symptoms, your health care provider may order tests to diagnose your condition. Some of these tests may include:   Blood tests.   CT scan.   MRI.   X-rays.   Ultrasounds.   Nerve conduction studies.  You may need to see a specialist.  TREATMENT  Finding treatment that works well may take time. You may be referred to a pain specialist. He or she may prescribe medicine or therapies, such as:   Mindful meditation or yoga.  Shots (injections) of numbing or pain-relieving medicines into the spine or area of pain.  Local electrical stimulation.  Acupuncture.   Massage therapy.   Aroma, color, light, or sound therapy.   Biofeedback.   Working with a physical therapist to keep from getting stiff.   Regular, gentle exercise.   Cognitive or behavioral therapy.   Group support.  Sometimes, surgery may be recommended.  HOME CARE INSTRUCTIONS    Take all medicines as directed by your health care provider.   Lessen stress in your life by relaxing and doing things such as listening to calming music.   Exercise or be active as directed by your health care provider.   Eat a healthy diet and include things such as vegetables, fruits, fish, and lean meats in your diet.   Keep all follow-up appointments with your health care provider.   Attend a support group with others suffering from chronic pain. SEEK MEDICAL CARE IF:   Your pain gets worse.   You develop a new pain that was not there before.   You cannot tolerate medicines given to you by your health care provider.   You have new symptoms since your last visit with your health care provider.  SEEK IMMEDIATE MEDICAL CARE IF:   You feel weak.   You have decreased sensation or numbness.   You lose control of bowel or bladder function.   Your pain suddenly gets much worse.   You develop shaking.  You develop chills.  You develop confusion.  You develop chest pain.  You develop shortness of breath.  MAKE SURE YOU:  Understand these instructions.  Will watch your condition.  Will get help right away if you are not doing well or get worse. Document Released: 03/05/2002 Document Revised: 02/13/2013 Document Reviewed: 12/07/2012 [facility] Patient Information 2014 [employer].  Abdominal Pain, Adult Many things can cause abdominal pain. Usually, abdominal pain is not caused by a disease and  will improve without treatment. It can often be observed and treated at home. Your health care provider will do a physical exam and possibly order blood tests and X-rays to help determine the seriousness of your pain. However, in many cases, more time must pass before a clear cause of the pain can be found. Before that point, your health care provider may not know if you need more testing or further treatment. °HOME CARE INSTRUCTIONS  °Monitor your abdominal pain  for any changes. The following actions may help to alleviate any discomfort you are experiencing: °· Only take over-the-counter or prescription medicines as directed by your health care provider. °· Do not take laxatives unless directed to do so by your health care provider. °· Try a clear liquid diet (broth, tea, or water) as directed by your health care provider. Slowly move to a bland diet as tolerated. °SEEK MEDICAL CARE IF: °· You have unexplained abdominal pain. °· You have abdominal pain associated with nausea or diarrhea. °· You have pain when you urinate or have a bowel movement. °· You experience abdominal pain that wakes you in the night. °· You have abdominal pain that is worsened or improved by eating food. °· You have abdominal pain that is worsened with eating fatty foods. °SEEK IMMEDIATE MEDICAL CARE IF:  °· Your pain does not go away within 2 hours. °· You have a fever. °· You keep throwing up (vomiting). °· Your pain is felt only in portions of the abdomen, such as the right side or the left lower portion of the abdomen. °· You pass bloody or black tarry stools. °MAKE SURE YOU: °· Understand these instructions.   °· Will watch your condition.   °· Will get help right away if you are not doing well or get worse.   °Document Released: 03/23/2005 Document Revised: 04/03/2013 Document Reviewed: 02/20/2013 °[employer]® Patient Information ©2014 [employer]. ° °

## 2013-09-03 NOTE — ED Provider Notes (Signed)
0930- reevaluation We have been waiting for general surgery to evaluate him after his ultrasound. The patient has been independently ambulating in the ED, and asking for more pain medicine. However, at this time. He is lying in bed, and appears somewhat lethargic. He is arousable. He appears sedated from narcotic medication. He has not had his usual 5 times a day, oxycodone 30 mg, since yesterday. The ultrasound report has returned and is not indicative of acute obliterative disease. His LFTs are reassuring. Repeat vital signs indicate mild hypertension.  29- I discussed the case with general surgery. They agree that he does not need any, additional evaluation for gallbladder problems. He, states at this time, that he does not have any narcotic pain medicine, at home. I reminded him that we do not prescribe chronic pain medications from the emergency department. He stated that he understands that.   Assessment: Specific abdominal pain with chronic pain. Is no indication for further treatment in the emergency department or hospital setting. He is stable for discharge.   Plan: Followup with primary care doctor as usual   1025- he is being ready for discharge, and his heart rate was elevated. EKG was obtained, which indicates, rapid atrial paced rhythm, atrial fibrillation, versus atrial flutter. A pressure normal.   EKG Interpretation  Date/Time:  Tuesday September 03 2013 10:10:28 EDT Ventricular Rate:  131 PR Interval:  223 QRS Duration: 96 QT Interval:  294 QTC Calculation: 434 R Axis:   52 Text Interpretation:  Atrial fibrillation vs. atrial flutter Ventricular premature complex Probable left ventricular hypertrophy Serial tracing suggested Confirmed by Eulis Foster  MD, Lashe Oliveira ([id number]) on 09/03/2013 10:19:28 AM      Lopressor ordered.  10:26 AM Reevaluation with update and discussion. After initial assessment and treatment, an updated evaluation reveals heart rate is improved. The patient has  paroxysmal atrial fibrillation, and is on Coumadin for that. Filbert Berthold, MD 09/03/13 [phone]

## 2013-09-18 ENCOUNTER — Inpatient Hospital Stay (HOSPITAL_COMMUNITY): Admission: RE | Admit: 2013-09-18 | Payer: Medicaid Other | Source: Ambulatory Visit

## 2013-09-19 ENCOUNTER — Ambulatory Visit (HOSPITAL_COMMUNITY): Admission: RE | Admit: 2013-09-19 | Payer: Medicaid Other | Source: Ambulatory Visit | Admitting: Otolaryngology

## 2013-09-19 ENCOUNTER — Encounter (HOSPITAL_COMMUNITY): Admission: RE | Payer: Self-pay | Source: Ambulatory Visit

## 2013-09-19 SURGERY — EXCISION, SUBMANDIBULAR GLAND
Anesthesia: General | Laterality: Left

## 2013-09-24 ENCOUNTER — Telehealth: Payer: Self-pay | Admitting: Cardiovascular Disease

## 2013-09-24 NOTE — Telephone Encounter (Signed)
New Message:  Pt states he has "swollen pancreatitis".... PT states he has been throwing up a lot... Pt states he has been to his P[facility] and to the hospital.. Pt just wanted to make Dr. Acie Fredrickson aware.Marland KitchenMarland Kitchen

## 2013-09-24 NOTE — Telephone Encounter (Signed)
I will forward to Dr Acie Fredrickson

## 2013-10-04 ENCOUNTER — Other Ambulatory Visit: Payer: Self-pay | Admitting: Cardiovascular Disease

## 2013-10-07 ENCOUNTER — Ambulatory Visit: Payer: Medicaid Other | Admitting: Cardiovascular Disease

## 2013-10-07 ENCOUNTER — Ambulatory Visit (INDEPENDENT_AMBULATORY_CARE_PROVIDER_SITE_OTHER): Payer: Medicaid Other

## 2013-10-07 DIAGNOSIS — I480 Paroxysmal atrial fibrillation: Secondary | ICD-10-CM

## 2013-10-07 DIAGNOSIS — I4891 Unspecified atrial fibrillation: Secondary | ICD-10-CM

## 2013-10-07 DIAGNOSIS — Z7901 Long term (current) use of anticoagulants: Secondary | ICD-10-CM

## 2013-10-07 LAB — POCT INR: INR: 1.7

## 2013-10-07 MED ORDER — WARFARIN SODIUM 5 MG PO TABS: ORAL_TABLET | ORAL | Status: DC

## 2013-10-14 ENCOUNTER — Ambulatory Visit (INDEPENDENT_AMBULATORY_CARE_PROVIDER_SITE_OTHER): Payer: Medicaid Other | Admitting: Cardiovascular Disease

## 2013-10-14 ENCOUNTER — Encounter: Payer: Self-pay | Admitting: Cardiovascular Disease

## 2013-10-14 ENCOUNTER — Ambulatory Visit (INDEPENDENT_AMBULATORY_CARE_PROVIDER_SITE_OTHER): Payer: Medicaid Other | Admitting: Pharmacist

## 2013-10-14 VITALS — BP 146/78 | HR 80 | Ht >= 80 in | Wt 272.0 lb

## 2013-10-14 DIAGNOSIS — I480 Paroxysmal atrial fibrillation: Secondary | ICD-10-CM

## 2013-10-14 DIAGNOSIS — Z7901 Long term (current) use of anticoagulants: Secondary | ICD-10-CM

## 2013-10-14 DIAGNOSIS — I4891 Unspecified atrial fibrillation: Secondary | ICD-10-CM

## 2013-10-14 DIAGNOSIS — J449 Chronic obstructive pulmonary disease, unspecified: Secondary | ICD-10-CM

## 2013-10-14 DIAGNOSIS — I10 Essential (primary) hypertension: Secondary | ICD-10-CM

## 2013-10-14 LAB — POCT INR: INR: 1.7

## 2013-10-14 NOTE — Patient Instructions (Signed)
Your physician recommends that you continue on your current medications as directed. Please refer to the Current Medication list given to you today.  Your physician wants you to follow-up in: 1 year with Dr. Nahser.  You will receive a reminder letter in the mail two months in advance. If you don't receive a letter, please call our office to schedule the follow-up appointment.  

## 2013-10-14 NOTE — Assessment & Plan Note (Signed)
We'll continue with Coumadin. His INR was 1.7 today. We'll have him follow up with the Coumadin clinic.  I will see again in one year for followup office visit.

## 2013-10-14 NOTE — Progress Notes (Signed)
[name] Date of Birth  1956/03/07       [facility] [address], [address], [place], [place]  [address]   [place], [place]  [address] [phone]     [phone]   Fax  [phone]    Fax [phone]  Problem List: 1. Paroxysmal atrial fibrillation 2. Hypothyroidism 3. Diabetes mellitus 4. Hyperlipidemia 5. Hypertension 6. Chronic pain  History of Present Illness:  Broady is a 58 yo who I have seen in the past for PAF.  His primary medical office is the [facility]  [facility] in [place].  He is on his third doctor in the past several years. He is not very  happy about this.  He's having lots of abdominal pain. He's had a poor appetite. He complains of being very thirsty and his reflux water. Also complains of polyuria. He states that his glucose levels have been well controlled.  He has having lots of generalized fatigue. She also is having trouble swallowing and chokes when he eats his cereal in the morning.  He denies any chest pain or shortness breath. He was seen in the emergency room in November, 2013 gallbladder pain. He was noted to be in normal sinus rhythm by EKG at that time.  September 26, 2012:  Thomes has been doing well.  He would like to try Xarelto instead of coumadin.  He is doing well.  No CP.  He is having some problems with his Hep C.    Sept. 11, 2014:  Nijah is seen back for eval of his PAF.  Has had a cough.  Still smoking.    October 14, 2013:  No cardiac complaints today.  Having left face pain due to a salivary gland stone.  Also has had some abdominal pain.   Was found to have ? Gall bladder and possibly pancreatitis problems.    He is walking some - several times a week.  Still smoking.    He thinks that he is taking both generic warfarin and brand name coumadin.   Current Outpatient Prescriptions on File Prior to Visit  Medication Sig Dispense Refill  . amLODipine (NORVASC) 5 MG tablet Take  5 mg by mouth daily.      . cholecalciferol (VITAMIN D) 1000 UNITS tablet Take 2,000 Units by mouth every evening.       . clindamycin (CLEOCIN) 300 MG capsule Take by mouth.      . diazepam (VALIUM) 10 MG tablet Take 10 mg by mouth 4 (four) times daily as needed. For anxiety      . fluticasone (FLONASE) 50 MCG/ACT nasal spray Place 1 spray into both nostrils daily as needed for allergies.       Marland Kitchen insulin glargine (LANTUS) 100 units/mL SOLN Inject 25 Units into the skin at bedtime. PEN      . levothyroxine (SYNTHROID, LEVOTHROID) 88 MCG tablet Take 88 mcg by mouth daily.       Marland Kitchen lisinopril (PRINIVIL,ZESTRIL) 40 MG tablet Take 40 mg by mouth daily.       Marland Kitchen loratadine (CLARITIN) 10 MG tablet Take 10 mg by mouth daily as needed for allergies.       . methocarbamol (ROBAXIN) 500 MG tablet Take 500 mg by mouth at bedtime as needed for muscle spasms.       . metoprolol succinate (TOPROL-XL) 50 MG 24 hr tablet Take 1 tablet (50 mg  total) by mouth daily. Take with or immediately following a meal.  30 tablet  6  . Multiple Vitamin (MULTIVITAMIN) tablet Take 1 tablet by mouth every evening.       Marland Kitchen omeprazole (PRILOSEC) 40 MG capsule Take 40 mg by mouth daily.       Marland Kitchen oxycodone (ROXICODONE) 30 MG immediate release tablet Take 30-60 mg by mouth 4 (four) times daily. Take 2 tablets in the morning and 1 tablet at lunch dinner and bedtime  30 tablet  0  . penicillin v potassium (VEETID) 500 MG tablet Take 500 mg by mouth 4 (four) times daily as needed (for flare up in legs).       . promethazine (PHENERGAN) 25 MG tablet Take 25 mg by mouth every 6 (six) hours as needed. For nausea      . silver sulfADIAZINE (SILVADENE) 1 % cream Apply topically.      . simvastatin (ZOCOR) 20 MG tablet Take 20 mg by mouth every evening.       . sucralfate (CARAFATE) 1 GM/10ML suspension Take 1 g by mouth 3 (three) times daily as needed (for stomach pain).      Marland Kitchen tiotropium (SPIRIVA) 18 MCG inhalation capsule Place 18 mcg into  inhaler and inhale daily.       Marland Kitchen warfarin (COUMADIN) 5 MG tablet Take as directed by anticoagulation clinic  60 tablet  1   No current facility-administered medications on file prior to visit.    Allergies  Allergen Reactions  . Antihistamines, Chlorpheniramine-Type   . Iodinated Diagnostic Agents   . Iodine   . Lidocaine Itching  . Methadone   . Other Hives    Steroid creams  . Oxycodone Hcl     Makes hyper  . Pentazocine Lactate   . Sulfa Antibiotics   . Vicodin [Hydrocodone-Acetaminophen]     Past Medical History  Diagnosis Date  . PAF (paroxysmal atrial fibrillation)   . Diabetes mellitus   . Hypothyroidism   . Hyperlipidemia   . Hypertension   . Chronic pain   . Melanoma   . OA (osteoarthritis)   . COPD (chronic obstructive pulmonary disease)   . Asthma   . RA (rheumatoid arthritis)   . Hepatitis C   . Venous stasis   . Bipolar 1 disorder   . Hepatitis C     Past Surgical History  Procedure Laterality Date  . Hernia repair    . Thyroidectomy    . Tonsillectomy    . US echocardiography  02/19/2010    EF 55-60%    History  Smoking status  . Current Every Day Smoker -- 1.00 packs/day  Smokeless tobacco  . Not on file    History  Alcohol Use No    Family History  Problem Relation Age of Onset  . Hypertension Mother   . Alzheimer's disease Mother   . Heart disease Father     Reviw of Systems:  Reviewed in the HPI.  All other systems are negative.  Physical Exam: Blood pressure 146/78, pulse 80, height 6\' 9"  (2.057 m), weight 272 lb (123.378 kg). General: Well developed, well nourished, in no acute distress.  Head: Normocephalic, atraumatic, sclera non-icteric, mucus membranes are moist,  Poor dentition  Neck: Supple. Carotids are 2 + without bruits. No JVD   Lungs: bilateral wheezing.   R> L   Heart: RR, soft systolic murmur.  Abdomen: Soft, non-tender, non-distended with normal bowel sounds.  Msk:  Strength and tone are normal  Extremities: No clubbing or cyanosis. No edema.  Chronic stasis changes.   Distal pedal pulses are 2+ and equal   Neuro: CN II - XII intact.  Alert and oriented X 3.   Psych:  He has a history of bipolar disease.  ECG:  Assessment / Plan:

## 2013-10-14 NOTE — Assessment & Plan Note (Signed)
He needs to quit smoking.

## 2013-11-04 ENCOUNTER — Ambulatory Visit (INDEPENDENT_AMBULATORY_CARE_PROVIDER_SITE_OTHER): Payer: Medicaid Other | Admitting: Pharmacist

## 2013-11-04 DIAGNOSIS — I4891 Unspecified atrial fibrillation: Secondary | ICD-10-CM

## 2013-11-04 DIAGNOSIS — Z7901 Long term (current) use of anticoagulants: Secondary | ICD-10-CM

## 2013-11-04 DIAGNOSIS — I480 Paroxysmal atrial fibrillation: Secondary | ICD-10-CM

## 2013-11-04 LAB — POCT INR: INR: 2.5

## 2013-11-04 MED ORDER — COUMADIN 7.5 MG PO TABS: ORAL_TABLET | ORAL | Status: DC

## 2013-11-04 MED ORDER — WARFARIN SODIUM 10 MG PO TABS: ORAL_TABLET | ORAL | Status: DC

## 2013-12-23 ENCOUNTER — Other Ambulatory Visit (HOSPITAL_COMMUNITY): Payer: Self-pay | Admitting: Otolaryngology

## 2013-12-24 ENCOUNTER — Encounter (HOSPITAL_COMMUNITY): Payer: Self-pay | Admitting: *Deleted

## 2013-12-24 NOTE — Addendum Note (Signed)
Addended by: Melida Quitter D on: 12/24/2013 06:19 PM   Modules accepted: Orders

## 2013-12-25 ENCOUNTER — Encounter (HOSPITAL_COMMUNITY): Payer: Self-pay | Admitting: *Deleted

## 2013-12-25 ENCOUNTER — Ambulatory Visit (HOSPITAL_COMMUNITY): Payer: Medicaid Other | Admitting: Anesthesiology

## 2013-12-25 ENCOUNTER — Inpatient Hospital Stay (HOSPITAL_COMMUNITY)
Admission: RE | Admit: 2013-12-25 | Discharge: 2013-12-27 | DRG: 156 | Disposition: A | Discharge status: Home / Self Care | Payer: Medicaid Other | Source: Ambulatory Visit | Attending: Otolaryngology | Admitting: Otolaryngology

## 2013-12-25 ENCOUNTER — Ambulatory Visit (HOSPITAL_COMMUNITY): Payer: Medicaid Other

## 2013-12-25 ENCOUNTER — Encounter (HOSPITAL_COMMUNITY): Payer: Medicaid Other | Admitting: Anesthesiology

## 2013-12-25 ENCOUNTER — Encounter (HOSPITAL_COMMUNITY)
Admission: RE | Disposition: A | Discharge status: Home / Self Care | Payer: Self-pay | Source: Ambulatory Visit | Attending: Otolaryngology

## 2013-12-25 DIAGNOSIS — J4489 Other specified chronic obstructive pulmonary disease: Secondary | ICD-10-CM | POA: Diagnosis present

## 2013-12-25 DIAGNOSIS — B1920 Unspecified viral hepatitis C without hepatic coma: Secondary | ICD-10-CM | POA: Diagnosis present

## 2013-12-25 DIAGNOSIS — I10 Essential (primary) hypertension: Secondary | ICD-10-CM | POA: Diagnosis present

## 2013-12-25 DIAGNOSIS — E039 Hypothyroidism, unspecified: Secondary | ICD-10-CM | POA: Diagnosis present

## 2013-12-25 DIAGNOSIS — I4891 Unspecified atrial fibrillation: Secondary | ICD-10-CM | POA: Diagnosis present

## 2013-12-25 DIAGNOSIS — M1990 Unspecified osteoarthritis, unspecified site: Secondary | ICD-10-CM | POA: Diagnosis present

## 2013-12-25 DIAGNOSIS — I872 Venous insufficiency (chronic) (peripheral): Secondary | ICD-10-CM | POA: Diagnosis present

## 2013-12-25 DIAGNOSIS — Z885 Allergy status to narcotic agent status: Secondary | ICD-10-CM

## 2013-12-25 DIAGNOSIS — F411 Generalized anxiety disorder: Secondary | ICD-10-CM | POA: Diagnosis present

## 2013-12-25 DIAGNOSIS — K115 Sialolithiasis: Principal | ICD-10-CM | POA: Diagnosis present

## 2013-12-25 DIAGNOSIS — M069 Rheumatoid arthritis, unspecified: Secondary | ICD-10-CM | POA: Diagnosis present

## 2013-12-25 DIAGNOSIS — Z7901 Long term (current) use of anticoagulants: Secondary | ICD-10-CM

## 2013-12-25 DIAGNOSIS — G8929 Other chronic pain: Secondary | ICD-10-CM | POA: Diagnosis present

## 2013-12-25 DIAGNOSIS — IMO0001 Reserved for inherently not codable concepts without codable children: Secondary | ICD-10-CM | POA: Diagnosis present

## 2013-12-25 DIAGNOSIS — J449 Chronic obstructive pulmonary disease, unspecified: Secondary | ICD-10-CM | POA: Diagnosis present

## 2013-12-25 DIAGNOSIS — E785 Hyperlipidemia, unspecified: Secondary | ICD-10-CM | POA: Diagnosis present

## 2013-12-25 DIAGNOSIS — F17200 Nicotine dependence, unspecified, uncomplicated: Secondary | ICD-10-CM | POA: Diagnosis present

## 2013-12-25 DIAGNOSIS — Z882 Allergy status to sulfonamides status: Secondary | ICD-10-CM

## 2013-12-25 DIAGNOSIS — E119 Type 2 diabetes mellitus without complications: Secondary | ICD-10-CM | POA: Diagnosis present

## 2013-12-25 DIAGNOSIS — Z85820 Personal history of malignant melanoma of skin: Secondary | ICD-10-CM

## 2013-12-25 DIAGNOSIS — F319 Bipolar disorder, unspecified: Secondary | ICD-10-CM | POA: Diagnosis present

## 2013-12-25 HISTORY — DX: Fibromyalgia: M79.7

## 2013-12-25 HISTORY — DX: Anxiety disorder, unspecified: F41.9

## 2013-12-25 HISTORY — DX: Acute pancreatitis without necrosis or infection, unspecified: K85.90

## 2013-12-25 HISTORY — PX: SUBMANDIBULAR GLAND EXCISION: SHX2456

## 2013-12-25 HISTORY — DX: Unspecified asthma, uncomplicated: J45.909

## 2013-12-25 LAB — GLUCOSE, CAPILLARY
GLUCOSE-CAPILLARY: 139 mg/dL — AB (ref 70–99)
Glucose-Capillary: 140 mg/dL — ABNORMAL HIGH (ref 70–99)
Glucose-Capillary: 199 mg/dL — ABNORMAL HIGH (ref 70–99)
Glucose-Capillary: 192 mg/dL — ABNORMAL HIGH (ref 70–99)

## 2013-12-25 LAB — PROTIME-INR
INR: 1.03 (ref 0.00–1.49)
Prothrombin Time: 13.5 seconds (ref 11.6–15.2)

## 2013-12-25 LAB — CBC
HEMATOCRIT: 40 % (ref 39.0–52.0)
Hemoglobin: 13.2 g/dL (ref 13.0–17.0)
MCH: 29 pg (ref 26.0–34.0)
MCHC: 33 g/dL (ref 30.0–36.0)
MCV: 87.9 fL (ref 78.0–100.0)
Platelets: 240 10*3/uL (ref 150–400)
RBC: 4.55 MIL/uL (ref 4.22–5.81)
RDW: 13.9 % (ref 11.5–15.5)
WBC: 7.7 10*3/uL (ref 4.0–10.5)

## 2013-12-25 LAB — COMPREHENSIVE METABOLIC PANEL
ALBUMIN: 3.4 g/dL — AB (ref 3.5–5.2)
ALK PHOS: 55 U/L (ref 39–117)
ALT: 16 U/L (ref 0–53)
AST: 20 U/L (ref 0–37)
BUN: 15 mg/dL (ref 6–23)
CHLORIDE: 100 meq/L (ref 96–112)
CO2: 25 meq/L (ref 19–32)
CREATININE: 1.09 mg/dL (ref 0.50–1.35)
Calcium: 9 mg/dL (ref 8.4–10.5)
GFR calc Af Amer: 85 mL/min — ABNORMAL LOW (ref >90)
GFR, EST NON AFRICAN AMERICAN: 74 mL/min — AB (ref >90)
Glucose, Bld: 133 mg/dL — ABNORMAL HIGH (ref 70–99)
Potassium: 4.2 mEq/L (ref 3.7–5.3)
Sodium: 138 mEq/L (ref 137–147)
Total Bilirubin: <0.2 mg/dL — ABNORMAL LOW (ref 0.3–1.2)
Total Protein: 7.5 g/dL (ref 6.0–8.3)

## 2013-12-25 SURGERY — EXCISION, SUBMANDIBULAR GLAND
Anesthesia: General | Site: Face | Laterality: Left

## 2013-12-25 MED ORDER — LIDOCAINE-EPINEPHRINE 1 %-1:100000 IJ SOLN
INTRAMUSCULAR | Status: DC | PRN
  Administered 2013-12-25: 3.5 mL

## 2013-12-25 MED ORDER — VITAMIN D3 25 MCG (1000 UNIT) PO TABS
2000.0000 [IU] | ORAL_TABLET | Freq: Every evening | ORAL | Status: DC
  Administered 2013-12-25 – 2013-12-26 (×2): 2000 [IU] via ORAL
  Filled 2013-12-25 (×3): qty 2

## 2013-12-25 MED ORDER — HYDROMORPHONE HCL PF 1 MG/ML IJ SOLN
0.2500 mg | INTRAMUSCULAR | Status: DC | PRN
  Administered 2013-12-25 (×4): 0.5 mg via INTRAVENOUS

## 2013-12-25 MED ORDER — ONDANSETRON HCL 4 MG/2ML IJ SOLN
INTRAMUSCULAR | Status: DC | PRN
  Administered 2013-12-25: 4 mg via INTRAVENOUS

## 2013-12-25 MED ORDER — NEOSTIGMINE METHYLSULFATE 10 MG/10ML IV SOLN
INTRAVENOUS | Status: AC
  Filled 2013-12-25: qty 1

## 2013-12-25 MED ORDER — FENTANYL CITRATE 0.05 MG/ML IJ SOLN
INTRAMUSCULAR | Status: AC
  Filled 2013-12-25: qty 5

## 2013-12-25 MED ORDER — LIDOCAINE HCL (CARDIAC) 20 MG/ML IV SOLN
INTRAVENOUS | Status: DC | PRN
  Administered 2013-12-25: 60 mg via INTRAVENOUS

## 2013-12-25 MED ORDER — KCL IN DEXTROSE-NACL 20-5-0.45 MEQ/L-%-% IV SOLN
INTRAVENOUS | Status: DC
  Administered 2013-12-25 – 2013-12-26 (×2): via INTRAVENOUS
  Filled 2013-12-25 (×5): qty 1000

## 2013-12-25 MED ORDER — MIDAZOLAM HCL 2 MG/2ML IJ SOLN
INTRAMUSCULAR | Status: AC
  Filled 2013-12-25: qty 2

## 2013-12-25 MED ORDER — ARTIFICIAL TEARS OP OINT
TOPICAL_OINTMENT | OPHTHALMIC | Status: AC
  Filled 2013-12-25: qty 3.5

## 2013-12-25 MED ORDER — INSULIN ASPART 100 UNIT/ML ~~LOC~~ SOLN
0.0000 [IU] | Freq: Three times a day (TID) | SUBCUTANEOUS | Status: DC
  Administered 2013-12-25: 3 [IU] via SUBCUTANEOUS
  Administered 2013-12-26: 2 [IU] via SUBCUTANEOUS
  Administered 2013-12-26 – 2013-12-27 (×3): 3 [IU] via SUBCUTANEOUS

## 2013-12-25 MED ORDER — LIDOCAINE-EPINEPHRINE 1 %-1:100000 IJ SOLN
INTRAMUSCULAR | Status: AC
  Filled 2013-12-25: qty 1

## 2013-12-25 MED ORDER — ONDANSETRON HCL 4 MG/2ML IJ SOLN
INTRAMUSCULAR | Status: AC
  Filled 2013-12-25: qty 2

## 2013-12-25 MED ORDER — SUCCINYLCHOLINE CHLORIDE 20 MG/ML IJ SOLN
INTRAMUSCULAR | Status: AC
  Filled 2013-12-25: qty 1

## 2013-12-25 MED ORDER — FLUTICASONE PROPIONATE 50 MCG/ACT NA SUSP
1.0000 | Freq: Every day | NASAL | Status: DC | PRN
  Filled 2013-12-25: qty 16

## 2013-12-25 MED ORDER — ADULT MULTIVITAMIN W/MINERALS CH
1.0000 | ORAL_TABLET | Freq: Every evening | ORAL | Status: DC
  Administered 2013-12-25 – 2013-12-26 (×2): 1 via ORAL
  Filled 2013-12-25 (×3): qty 1

## 2013-12-25 MED ORDER — INSULIN GLARGINE 100 UNIT/ML ~~LOC~~ SOLN
27.0000 [IU] | Freq: Every day | SUBCUTANEOUS | Status: DC
  Administered 2013-12-25 – 2013-12-26 (×2): 27 [IU] via SUBCUTANEOUS
  Filled 2013-12-25 (×3): qty 0.27

## 2013-12-25 MED ORDER — CEFAZOLIN SODIUM-DEXTROSE 2-3 GM-% IV SOLR
INTRAVENOUS | Status: DC | PRN
  Administered 2013-12-25: 2 g via INTRAVENOUS

## 2013-12-25 MED ORDER — BACITRACIN ZINC 500 UNIT/GM EX OINT
TOPICAL_OINTMENT | CUTANEOUS | Status: AC
  Filled 2013-12-25: qty 15

## 2013-12-25 MED ORDER — METOPROLOL SUCCINATE ER 50 MG PO TB24
50.0000 mg | ORAL_TABLET | Freq: Every day | ORAL | Status: DC
  Administered 2013-12-26 – 2013-12-27 (×2): 50 mg via ORAL
  Filled 2013-12-25 (×2): qty 1

## 2013-12-25 MED ORDER — GLYCOPYRROLATE 0.2 MG/ML IJ SOLN
INTRAMUSCULAR | Status: DC | PRN
  Administered 2013-12-25: 0.4 mg via INTRAVENOUS

## 2013-12-25 MED ORDER — SUCCINYLCHOLINE CHLORIDE 20 MG/ML IJ SOLN
INTRAMUSCULAR | Status: DC | PRN
  Administered 2013-12-25: 160 mg via INTRAVENOUS

## 2013-12-25 MED ORDER — LEVOTHYROXINE SODIUM 88 MCG PO TABS
88.0000 ug | ORAL_TABLET | Freq: Every day | ORAL | Status: DC
  Administered 2013-12-26 – 2013-12-27 (×2): 88 ug via ORAL
  Filled 2013-12-25 (×3): qty 1

## 2013-12-25 MED ORDER — OXYCODONE HCL 5 MG PO TABS
30.0000 mg | ORAL_TABLET | Freq: Every day | ORAL | Status: DC
  Administered 2013-12-25 – 2013-12-27 (×11): 30 mg via ORAL
  Filled 2013-12-25 (×10): qty 6

## 2013-12-25 MED ORDER — ROCURONIUM BROMIDE 100 MG/10ML IV SOLN
INTRAVENOUS | Status: DC | PRN
  Administered 2013-12-25: 30 mg via INTRAVENOUS

## 2013-12-25 MED ORDER — LISINOPRIL 40 MG PO TABS
40.0000 mg | ORAL_TABLET | Freq: Every day | ORAL | Status: DC
  Administered 2013-12-26 – 2013-12-27 (×2): 40 mg via ORAL
  Filled 2013-12-25: qty 1
  Filled 2013-12-25: qty 2
  Filled 2013-12-25 (×2): qty 1
  Filled 2013-12-25: qty 2

## 2013-12-25 MED ORDER — SIMVASTATIN 20 MG PO TABS
20.0000 mg | ORAL_TABLET | Freq: Every evening | ORAL | Status: DC
  Administered 2013-12-25 – 2013-12-26 (×2): 20 mg via ORAL
  Filled 2013-12-25 (×3): qty 1

## 2013-12-25 MED ORDER — PANTOPRAZOLE SODIUM 40 MG PO TBEC
40.0000 mg | DELAYED_RELEASE_TABLET | Freq: Every day | ORAL | Status: DC
  Administered 2013-12-25 – 2013-12-27 (×3): 40 mg via ORAL
  Filled 2013-12-25 (×3): qty 1

## 2013-12-25 MED ORDER — HYDROMORPHONE HCL PF 1 MG/ML IJ SOLN
INTRAMUSCULAR | Status: AC
  Administered 2013-12-25: 0.5 mg via INTRAVENOUS
  Filled 2013-12-25: qty 1

## 2013-12-25 MED ORDER — ROCURONIUM BROMIDE 50 MG/5ML IV SOLN
INTRAVENOUS | Status: AC
  Filled 2013-12-25: qty 1

## 2013-12-25 MED ORDER — NEOSTIGMINE METHYLSULFATE 10 MG/10ML IV SOLN
INTRAVENOUS | Status: DC | PRN
  Administered 2013-12-25: 3 mg via INTRAVENOUS

## 2013-12-25 MED ORDER — INSULIN GLARGINE 100 UNITS/ML SOLOSTAR PEN
27.0000 [IU] | PEN_INJECTOR | Freq: Every day | SUBCUTANEOUS | Status: DC
  Filled 2013-12-25: qty 3

## 2013-12-25 MED ORDER — NICOTINE 21 MG/24HR TD PT24
21.0000 mg | MEDICATED_PATCH | Freq: Every day | TRANSDERMAL | Status: DC
  Administered 2013-12-25 – 2013-12-27 (×3): 21 mg via TRANSDERMAL
  Filled 2013-12-25 (×4): qty 1

## 2013-12-25 MED ORDER — OXYCODONE HCL 5 MG PO TABS
ORAL_TABLET | ORAL | Status: AC
  Administered 2013-12-25: 30 mg via ORAL
  Filled 2013-12-25: qty 6

## 2013-12-25 MED ORDER — MIDAZOLAM HCL 5 MG/5ML IJ SOLN
INTRAMUSCULAR | Status: DC | PRN
  Administered 2013-12-25: 2 mg via INTRAVENOUS

## 2013-12-25 MED ORDER — 0.9 % SODIUM CHLORIDE (POUR BTL) OPTIME
TOPICAL | Status: DC | PRN
  Administered 2013-12-25: 1000 mL

## 2013-12-25 MED ORDER — TIOTROPIUM BROMIDE MONOHYDRATE 18 MCG IN CAPS
18.0000 ug | ORAL_CAPSULE | Freq: Every day | RESPIRATORY_TRACT | Status: DC
  Administered 2013-12-26 – 2013-12-27 (×2): 18 ug via RESPIRATORY_TRACT
  Filled 2013-12-25: qty 5

## 2013-12-25 MED ORDER — PROPOFOL 10 MG/ML IV BOLUS
INTRAVENOUS | Status: AC
  Filled 2013-12-25: qty 20

## 2013-12-25 MED ORDER — LIDOCAINE HCL (CARDIAC) 20 MG/ML IV SOLN
INTRAVENOUS | Status: AC
  Filled 2013-12-25: qty 5

## 2013-12-25 MED ORDER — PROPOFOL 10 MG/ML IV BOLUS
INTRAVENOUS | Status: DC | PRN
Start: 2013-12-25 — End: 2013-12-25
  Administered 2013-12-25: 200 mg via INTRAVENOUS
  Administered 2013-12-25: 100 mg via INTRAVENOUS

## 2013-12-25 MED ORDER — PROMETHAZINE HCL 25 MG RE SUPP: 25.0000 mg | Freq: Four times a day (QID) | RECTAL | Status: DC | PRN

## 2013-12-25 MED ORDER — METHOCARBAMOL 500 MG PO TABS
500.0000 mg | ORAL_TABLET | Freq: Every evening | ORAL | Status: DC | PRN
  Administered 2013-12-27: 500 mg via ORAL
  Filled 2013-12-25: qty 1

## 2013-12-25 MED ORDER — HYDROMORPHONE HCL PF 1 MG/ML IJ SOLN
INTRAMUSCULAR | Status: AC
  Filled 2013-12-25: qty 1

## 2013-12-25 MED ORDER — DIAZEPAM 5 MG PO TABS
10.0000 mg | ORAL_TABLET | Freq: Four times a day (QID) | ORAL | Status: DC | PRN
  Administered 2013-12-26 (×2): 10 mg via ORAL
  Filled 2013-12-25 (×2): qty 2

## 2013-12-25 MED ORDER — LACTATED RINGERS IV SOLN
INTRAVENOUS | Status: DC | PRN
  Administered 2013-12-25 (×2): via INTRAVENOUS

## 2013-12-25 MED ORDER — PROPOFOL INFUSION 10 MG/ML OPTIME
INTRAVENOUS | Status: DC | PRN
  Administered 2013-12-25: 50 ug/kg/min via INTRAVENOUS

## 2013-12-25 MED ORDER — METHOCARBAMOL 500 MG PO TABS
ORAL_TABLET | ORAL | Status: AC
  Filled 2013-12-25: qty 1

## 2013-12-25 MED ORDER — PHENYLEPHRINE HCL 10 MG/ML IJ SOLN
INTRAMUSCULAR | Status: DC | PRN
  Administered 2013-12-25 (×2): 80 ug via INTRAVENOUS

## 2013-12-25 MED ORDER — MORPHINE SULFATE 2 MG/ML IJ SOLN
2.0000 mg | INTRAMUSCULAR | Status: DC | PRN
  Administered 2013-12-25 – 2013-12-26 (×3): 2 mg via INTRAVENOUS
  Administered 2013-12-26: 4 mg via INTRAVENOUS
  Administered 2013-12-26: 2 mg via INTRAVENOUS
  Filled 2013-12-25: qty 2
  Filled 2013-12-25 (×4): qty 1

## 2013-12-25 MED ORDER — FENTANYL CITRATE 0.05 MG/ML IJ SOLN
INTRAMUSCULAR | Status: DC | PRN
  Administered 2013-12-25 (×6): 50 ug via INTRAVENOUS
  Administered 2013-12-25: 100 ug via INTRAVENOUS
  Administered 2013-12-25 (×2): 50 ug via INTRAVENOUS

## 2013-12-25 MED ORDER — ARTIFICIAL TEARS OP OINT
TOPICAL_OINTMENT | OPHTHALMIC | Status: DC | PRN
  Administered 2013-12-25: 1 via OPHTHALMIC

## 2013-12-25 MED ORDER — AMLODIPINE BESYLATE 5 MG PO TABS
5.0000 mg | ORAL_TABLET | Freq: Every day | ORAL | Status: DC
  Administered 2013-12-25 – 2013-12-27 (×3): 5 mg via ORAL
  Filled 2013-12-25 (×4): qty 1

## 2013-12-25 MED ORDER — LORATADINE 10 MG PO TABS: 10.0000 mg | ORAL_TABLET | Freq: Every day | ORAL | Status: DC | PRN

## 2013-12-25 MED ORDER — GLYCOPYRROLATE 0.2 MG/ML IJ SOLN
INTRAMUSCULAR | Status: AC
  Filled 2013-12-25: qty 2

## 2013-12-25 MED ORDER — CLINDAMYCIN PHOSPHATE 600 MG/50ML IV SOLN
600.0000 mg | Freq: Three times a day (TID) | INTRAVENOUS | Status: DC
  Administered 2013-12-25 – 2013-12-27 (×6): 600 mg via INTRAVENOUS
  Filled 2013-12-25 (×9): qty 50

## 2013-12-25 SURGICAL SUPPLY — 55 items
ADH SKN CLS APL DERMABOND .7 (GAUZE/BANDAGES/DRESSINGS) ×1
APL SKNCLS STERI-STRIP NONHPOA (GAUZE/BANDAGES/DRESSINGS) ×1
ATTRACTOMAT 16X20 MAGNETIC DRP (DRAPES) IMPLANT
BENZOIN TINCTURE PRP APPL 2/3 (GAUZE/BANDAGES/DRESSINGS) ×2 IMPLANT
BLADE 10 SAFETY STRL DISP (BLADE) ×3 IMPLANT
CANISTER SUCTION 2500CC (MISCELLANEOUS) ×3 IMPLANT
CLEANER TIP ELECTROSURG 2X2 (MISCELLANEOUS) ×3 IMPLANT
CONT SPEC 4OZ CLIKSEAL STRL BL (MISCELLANEOUS) ×3 IMPLANT
CORDS BIPOLAR (ELECTRODE) ×3 IMPLANT
COVER SURGICAL LIGHT HANDLE (MISCELLANEOUS) ×3 IMPLANT
DECANTER SPIKE VIAL GLASS SM (MISCELLANEOUS) ×3 IMPLANT
DERMABOND ADVANCED (GAUZE/BANDAGES/DRESSINGS) ×2
DERMABOND ADVANCED .7 DNX12 (GAUZE/BANDAGES/DRESSINGS) ×1 IMPLANT
DRAIN CHANNEL 7F 3/4 FLAT (WOUND CARE) IMPLANT
DRAIN HEMOVAC 7FR (DRAIN) ×2 IMPLANT
DRAIN PENROSE 1/4X12 LTX STRL (WOUND CARE) IMPLANT
DRAIN SNY 10 ROU (WOUND CARE) IMPLANT
DRAPE SURG 17X23 STRL (DRAPES) IMPLANT
ELECT COATED BLADE 2.86 ST (ELECTRODE) ×3 IMPLANT
ELECT REM PT RETURN 9FT ADLT (ELECTROSURGICAL) ×3
ELECTRODE REM PT RTRN 9FT ADLT (ELECTROSURGICAL) ×1 IMPLANT
EVACUATOR SILICONE 100CC (DRAIN) IMPLANT
GLOVE BIO SURGEON STRL SZ 6.5 (GLOVE) ×1 IMPLANT
GLOVE BIO SURGEON STRL SZ7 (GLOVE) ×2 IMPLANT
GLOVE BIO SURGEON STRL SZ7.5 (GLOVE) ×4 IMPLANT
GLOVE BIO SURGEONS STRL SZ 6.5 (GLOVE) ×1
GLOVE BIOGEL PI IND STRL 7.5 (GLOVE) IMPLANT
GLOVE BIOGEL PI INDICATOR 7.5 (GLOVE) ×6
GLOVE ECLIPSE 7.5 STRL STRAW (GLOVE) ×5 IMPLANT
GOWN STRL REUS W/ TWL LRG LVL3 (GOWN DISPOSABLE) ×2 IMPLANT
GOWN STRL REUS W/TWL LRG LVL3 (GOWN DISPOSABLE) ×12
KIT BASIN OR (CUSTOM PROCEDURE TRAY) ×3 IMPLANT
KIT ROOM TURNOVER OR (KITS) ×3 IMPLANT
LOCATOR NERVE 3 VOLT (DISPOSABLE) IMPLANT
NS IRRIG 1000ML POUR BTL (IV SOLUTION) ×3 IMPLANT
PAD ARMBOARD 7.5X6 YLW CONV (MISCELLANEOUS) ×6 IMPLANT
PENCIL BUTTON HOLSTER BLD 10FT (ELECTRODE) ×3 IMPLANT
PROBE NERVBE PRASS .33 (MISCELLANEOUS) IMPLANT
SPECIMEN JAR SMALL (MISCELLANEOUS) IMPLANT
STAPLER VISISTAT 35W (STAPLE) ×3 IMPLANT
SUT CHROMIC 3 0 PS 2 (SUTURE) IMPLANT
SUT ETHILON 2 0 FS 18 (SUTURE) ×2 IMPLANT
SUT ETHILON 3 0 PS 1 (SUTURE) IMPLANT
SUT ETHILON 5 0 P 3 18 (SUTURE)
SUT NYLON ETHILON 5-0 P-3 1X18 (SUTURE) IMPLANT
SUT SILK 2 0 REEL (SUTURE) IMPLANT
SUT SILK 2 0 SH CR/8 (SUTURE) ×3 IMPLANT
SUT SILK 3 0 REEL (SUTURE) ×3 IMPLANT
SUT SILK 4 0 TIES 17X18 (SUTURE) ×3 IMPLANT
SUT VIC AB 3-0 FS2 27 (SUTURE) ×2 IMPLANT
SUT VICRYL 4-0 PS2 18IN ABS (SUTURE) ×2 IMPLANT
TAPE HY-TAPE 1X5Y PINK NS LF (GAUZE/BANDAGES/DRESSINGS) ×2 IMPLANT
TOWEL OR NON WOVEN STRL DISP B (DISPOSABLE) ×2 IMPLANT
TRAY ENT MC OR (CUSTOM PROCEDURE TRAY) ×3 IMPLANT
WATER STERILE IRR 1000ML POUR (IV SOLUTION) ×1 IMPLANT

## 2013-12-25 NOTE — H&P (Signed)
Johnny Sims is an 58 y.o. male.   Chief Complaint: Left submandibular gland sialolithiasis HPI: 58 year old male with left submandibular gland swelling and pain since January.  Antibiotics help temporarily.  CT imaging demonstrated stones in the left submandibular gland and duct.  He presents for surgical management.  Past Medical History  Diagnosis Date  . PAF (paroxysmal atrial fibrillation)   . Diabetes mellitus   . Hypothyroidism   . Hyperlipidemia   . Hypertension   . Chronic pain   . OA (osteoarthritis)   . COPD (chronic obstructive pulmonary disease)   . RA (rheumatoid arthritis)   . Hepatitis C   . Venous stasis   . Bipolar 1 disorder   . Hepatitis C   . Fibromyalgia   . Dysrhythmia     PAF  . Asthma   . Anxiety   . Melanoma     face, shoulder arm  . Pancreatitis, acute 2015    Past Surgical History  Procedure Laterality Date  . Hernia repair Bilateral     inguinal  . Thyroidectomy    . Tonsillectomy    . US echocardiography  02/19/2010    EF 55-60%  . Skin cancer excision      melonoma  . Hernia repair      umbicial  . Fracture surgery Left     arm    Family History  Problem Relation Age of Onset  . Hypertension Mother   . Alzheimer's disease Mother   . Heart disease Father    Social History:  reports that he has been smoking.  He does not have any smokeless tobacco history on file. He reports that he drinks about .6 ounces of alcohol per week. He reports that he does not use illicit drugs.  Allergies:  Allergies  Allergen Reactions  . Antihistamines, Chlorpheniramine-Type Other (See Comments)    unknown  . Iodinated Diagnostic Agents Other (See Comments)    unknown  . Lidocaine Itching  . Methadone Other (See Comments)    "I won't take it anymore"  . Other Hives    Steroid creams  . Oxycodone Hcl     Makes hyper  . Pentazocine Lactate Other (See Comments)    unknown  . Sulfa Antibiotics Itching and Other (See Comments)    whelps  .  Vicodin [Hydrocodone-Acetaminophen] Other (See Comments)    Doesn't want to take    Medications Prior to Admission  Medication Sig Dispense Refill  . amLODipine (NORVASC) 5 MG tablet Take 5 mg by mouth daily.      . cholecalciferol (VITAMIN D) 1000 UNITS tablet Take 2,000 Units by mouth every evening.       . clindamycin (CLEOCIN) 300 MG capsule Take 300 mg by mouth 3 (three) times daily.       . diazepam (VALIUM) 10 MG tablet Take 10 mg by mouth 4 (four) times daily as needed. For anxiety      . fluticasone (FLONASE) 50 MCG/ACT nasal spray Place 1 spray into both nostrils daily as needed for allergies.       Marland Kitchen insulin glargine (LANTUS) 100 units/mL SOLN Inject 27 Units into the skin at bedtime. PEN      . levothyroxine (SYNTHROID, LEVOTHROID) 88 MCG tablet Take 88 mcg by mouth daily.       Marland Kitchen lisinopril (PRINIVIL,ZESTRIL) 40 MG tablet Take 40 mg by mouth daily.       Marland Kitchen loratadine (CLARITIN) 10 MG tablet Take 10 mg by mouth  daily as needed for allergies.       . methocarbamol (ROBAXIN) 500 MG tablet Take 500 mg by mouth at bedtime as needed for muscle spasms.       . metoprolol succinate (TOPROL-XL) 50 MG 24 hr tablet Take 1 tablet (50 mg total) by mouth daily. Take with or immediately following a meal.  30 tablet  6  . Multiple Vitamin (MULTIVITAMIN) tablet Take 1 tablet by mouth every evening.       Marland Kitchen omeprazole (PRILOSEC) 40 MG capsule Take 40 mg by mouth daily.       Marland Kitchen oxycodone (ROXICODONE) 30 MG immediate release tablet Take 30 mg by mouth 5 (five) times daily. Take 2 tablets in the morning,1 tablet at lunch and 2 tablets in the afternoon.  30 tablet  0  . penicillin v potassium (VEETID) 500 MG tablet Take 500 mg by mouth 4 (four) times daily as needed (for flare up in legs).       . promethazine (PHENERGAN) 25 MG suppository Place 25 mg rectally every 6 (six) hours as needed for nausea or vomiting.      . simvastatin (ZOCOR) 20 MG tablet Take 20 mg by mouth every evening.       .  tiotropium (SPIRIVA) 18 MCG inhalation capsule Place 18 mcg into inhaler and inhale daily.       Marland Kitchen warfarin (COUMADIN) 10 MG tablet Take 10 mg by mouth See admin instructions. Take 1 tablet (10 mg) on Monday, Wednesday, Friday and Saturday (take 7.5 mg tablet on all other days) Must be brand name      . warfarin (COUMADIN) 7.5 MG tablet Take 7.5 mg by mouth See admin instructions. Take 1 tablet (7.5 mg) on Sunday, Tuesday and Thursday (take 10 mg tablet on all other days) Must be brand name        Results for orders placed during the hospital encounter of 12/25/13 (from the past 48 hour(s))  COMPREHENSIVE METABOLIC PANEL     Status: Abnormal   Collection Time    12/25/13  6:33 AM      Result Value Ref Range   Sodium 138  137 - 147 mEq/L   Potassium 4.2  3.7 - 5.3 mEq/L   Chloride 100  96 - 112 mEq/L   CO2 25  19 - 32 mEq/L   Glucose, Bld 133 (*) 70 - 99 mg/dL   BUN 15  6 - 23 mg/dL   Creatinine, Ser 1.09  0.50 - 1.35 mg/dL   Calcium 9.0  8.4 - 10.5 mg/dL   Total Protein 7.5  6.0 - 8.3 g/dL   Albumin 3.4 (*) 3.5 - 5.2 g/dL   AST 20  0 - 37 U/L   ALT 16  0 - 53 U/L   Alkaline Phosphatase 55  39 - 117 U/L   Total Bilirubin <0.2 (*) 0.3 - 1.2 mg/dL   GFR calc non Af Amer 74 (*) >90 mL/min   GFR calc Af Amer 85 (*) >90 mL/min   Comment: (NOTE)     The eGFR has been calculated using the CKD EPI equation.     This calculation has not been validated in all clinical situations.     eGFR's persistently <90 mL/min signify possible Chronic Kidney     Disease.  PROTIME-INR     Status: None   Collection Time    12/25/13  6:33 AM      Result Value Ref Range   Prothrombin Time 13.5  11.6 - 15.2 seconds   INR 1.03  0.00 - 1.49  CBC     Status: None   Collection Time    12/25/13  6:33 AM      Result Value Ref Range   WBC 7.7  4.0 - 10.5 K/uL   RBC 4.55  4.22 - 5.81 MIL/uL   Hemoglobin 13.2  13.0 - 17.0 g/dL   HCT 40.0  39.0 - 52.0 %   MCV 87.9  78.0 - 100.0 fL   MCH 29.0  26.0 - 34.0 pg    MCHC 33.0  30.0 - 36.0 g/dL   RDW 13.9  11.5 - 15.5 %   Platelets 240  150 - 400 K/uL  GLUCOSE, CAPILLARY     Status: Abnormal   Collection Time    12/25/13  6:59 AM      Result Value Ref Range   Glucose-Capillary 140 (*) 70 - 99 mg/dL   Dg Chest 2 View  12/25/2013   CLINICAL DATA:  Preop.  History of hypertension.  EXAM: CHEST  2 VIEW  COMPARISON:  02/19/2010  FINDINGS: The posterior costophrenic sulci obscured from view.  Otherwise:  Normal heart size and mediastinal contours. No acute infiltrate or edema. No effusion or pneumothorax. No acute osseous findings.  IMPRESSION: No active cardiopulmonary disease.   Electronically Signed   By: Jorje Guild M.D.   On: 12/25/2013 06:40    Review of Systems  Musculoskeletal: Positive for neck pain.  All other systems reviewed and are negative.   Blood pressure 130/74, pulse 78, temperature 98.3 F (36.8 C), temperature source Oral, resp. rate 18, height _0  (2.057 m), weight 119.659 kg (263 lb 12.8 oz), SpO2 98.00%. Physical Exam  Constitutional: He is oriented to person, place, and time. He appears well-developed and well-nourished. No distress.  HENT:  Head: Normocephalic and atraumatic.  Right Ear: External ear normal.  Left Ear: External ear normal.  Nose: Nose normal.  Mouth/Throat: Oropharynx is clear and moist.  Eyes: Conjunctivae and EOM are normal. Pupils are equal, round, and reactive to light.  Neck: Normal range of motion. Neck supple.  Left submandibular gland enlarged and tender.  Cardiovascular: Normal rate.   Respiratory: Effort normal.  Musculoskeletal: Normal range of motion.  Neurological: He is alert and oriented to person, place, and time. No cranial nerve deficit.  Skin: Skin is warm and dry.  Psychiatric: He has a normal mood and affect. His behavior is normal. Judgment and thought content normal.     Assessment/Plan Left submandibular gland sialolithiasis To OR for left submandibular gland resection  and left sialolithotomy.  Johnny Sims 12/25/2013, 8:29 AM

## 2013-12-25 NOTE — Transfer of Care (Signed)
Immediate Anesthesia Transfer of Care Note  Patient: Johnny Sims  Procedure(s) Performed: Procedure(s): SUBMANDIBULAR GLAND STONE AND CHRONIC SIALOLITHIASIS EXCISION (Left)  Patient Location: PACU  Anesthesia Type:General  Level of Consciousness: awake, alert , oriented and sedated  Airway & Oxygen Therapy: Patient Spontanous Breathing and Patient connected to nasal cannula oxygen  Post-op Assessment: Report given to PACU RN, Post -op Vital signs reviewed and stable and Patient moving all extremities  Post vital signs: Reviewed and stable  Complications: No apparent anesthesia complications

## 2013-12-25 NOTE — Op Note (Signed)
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  PROCEDURE: 1. Resection of left. submandibular gland. 2. Left sialolithotomy.  SURGEON:  Onnie Graham, MD.  ASSISTANT:  Jolene Provost, PAC.  ANESTHESIA:  General endotracheal anesthesia.  COMPLICATIONS:  None.  INDICATIONS:  The patient is a 58 year old male, who has had swelling of the left submandibular gland with pain since January.  This has been treatable briefly at times with antibiotics but has persisted.  CT imaging demonstrates a large stone in the left submandibular gland hilum and a couple of small fragments in the distal duct on the left side. With these findings, and ongoing problems, he presents to the operating room for surgical management.  FINDINGS:  The left submandibular gland was quite enlarged, edematous, and firm.  Surrounding tissues were stuck to the gland due to inflammation.  A large stone was found in the proximal duct as well as a second smaller fragment.  Through the floor of mouth, no additional stone was found.  DESCRIPTION OF PROCEDURE:  The patient was identified in the holding room, informed consent having been obtained.  Discussion of risks, benefits, alternatives, the patient was brought to the operative suite, placed on the operating table in supine position.  Anesthesia was induced.  The patient was intubated by anesthesia team using a GlideScope without difficulty.  The patient was given intravenous antibiotics during the case.  The eyes taped, closed, and a  shoulder roll was placed.  The left neck incision was marked with a marking pen and injected with 1% lidocaine with 100,000 epinephrine.  The left neck was prepped and draped in sterile fashion.  Incision was made with a 15- blade scalpel through the skin and extended through subcutaneous layer, and platysma layer using Bovie electrocautery.  Dissection was performed directly deep to this until the fracture facial vein was identified, was divided, and ligated.  This served as a landmark for staying away from the marginal mandibular nerve.  Dissection continued in deep to the vein down to the lower edge of the submandibular gland.  The soft tissues were then dissected off the gland in the superior direction, including to the division of the facial vessels.  The gland extended anteriorly beyond the facial vessels by quite a distance.  Continued dissection was then performed around the inferior extent of the gland down to the digastric muscle.  Dissection was then continued around the anterior extent of the gland carefully dividing tissues until the mylohyoid muscle was identified.  This was then retracted anteriorly.  Continued dissection was then performed around the superior edge of the gland dividing ligating vessels and exposing the lingual nerve.  The hilum of the gland was highly inflamed, and thickened and so dissection  in this area resulted in opening of the proximal duct system.  A large stone was immediately encountered and was able to be removed with hemostat.  An additional small stone was also removed.  The remainder of the duct was then divided and the gland was able to be removed.  The proximal duct was left open in the wound as closing was impossible with inflammatory changes.  The wound was copiously irrigated with saline and bleeding controlled with bipolar electrocautery.  A finger was placed in the mouth to palpate the floor of mouth, to try to milk out any  additional stones and suction was placed within the duct system proximally but no additional stone fragments were countered this way.  After continued irrigation, a 7-French drain was placed in the depth of the wound and secured to the posterior extending incision using 2-0 nylon suture in standard drain stitch.  The platysmal layer was closed with 3-0 Vicryl suture in a simple interrupted fashion.  The skin was closed with subcutaneous layer of 4-0 Vicryl suture in a simple fashion, and Dermabond for the skin.  The drain was hooked to suction during closure and was then placed to bulb suction.  Drapes were removed.  The floor of mouth on the left was then injected with local anesthetic and the papilla of the submandibular gland duct was removed with scissors.  The wound was then probed with a lacrimal probe until the probe could be extended into the duct system.  This was then deflated open using 15- blade scalpel, and further inspection performed and no additional stones seen.  At this point, the procedure was terminated and the patient returned to Anesthesia for wake up.  The drain was hooked and was taped to the left shoulder after cleaning the patient off.  He was extubated, moved to the recovery room in stable condition.     Onnie Graham, MD     DDB/MEDQ  D:  12/25/2013  T:  12/25/2013  Job:  [id number]

## 2013-12-25 NOTE — Anesthesia Preprocedure Evaluation (Addendum)
Anesthesia Evaluation  Patient identified by MRN, date of birth, ID band Patient awake    Reviewed: Allergy & Precautions, H&P , NPO status , Patient's Chart, lab work & pertinent test results, reviewed documented beta blocker date and time   Airway Mallampati: III TM Distance: >3 FB Neck ROM: Full    Dental no notable dental hx. (+) Poor Dentition, Dental Advisory Given   Pulmonary asthma , COPD COPD inhaler, Current Smoker,  breath sounds clear to auscultation  Pulmonary exam normal       Cardiovascular hypertension, Pt. on medications and Pt. on home beta blockers + dysrhythmias Atrial Fibrillation Rhythm:Regular Rate:Normal     Neuro/Psych Anxiety Bipolar Disorder negative neurological ROS     GI/Hepatic negative GI ROS, (+) Hepatitis -, C  Endo/Other  diabetes, Type 1, Insulin DependentHypothyroidism   Renal/GU negative Renal ROS  negative genitourinary   Musculoskeletal  (+) Arthritis -, Fibromyalgia -, narcotic dependent  Abdominal   Peds  Hematology negative hematology ROS (+)   Anesthesia Other Findings   Reproductive/Obstetrics negative OB ROS                          Anesthesia Physical Anesthesia Plan  ASA: III  Anesthesia Plan: General   Post-op Pain Management:    Induction: Intravenous  Airway Management Planned: Oral ETT and Video Laryngoscope Planned  Additional Equipment:   Intra-op Plan:   Post-operative Plan: Extubation in OR  Informed Consent: I have reviewed the patients History and Physical, chart, labs and discussed the procedure including the risks, benefits and alternatives for the proposed anesthesia with the patient or authorized representative who has indicated his/her understanding and acceptance.   Dental advisory given  Plan Discussed with: CRNA  Anesthesia Plan Comments:        Anesthesia Quick Evaluation

## 2013-12-25 NOTE — Anesthesia Procedure Notes (Signed)
Procedure Name: Intubation Date/Time: 12/25/2013 8:53 AM Performed by: Scheryl Darter Pre-anesthesia Checklist: Patient identified, Emergency Drugs available, Suction available, Patient being monitored and Timeout performed Patient Re-evaluated:Patient Re-evaluated prior to inductionOxygen Delivery Method: Circle system utilized Preoxygenation: Pre-oxygenation with 100% oxygen Intubation Type: IV induction Ventilation: Mask ventilation without difficulty Grade View: Grade II Tube type: Oral Tube size: 7.5 mm Number of attempts: 2 Airway Equipment and Method: Stylet and Video-laryngoscopy Placement Confirmation: ETT inserted through vocal cords under direct vision,  positive ETCO2 and breath sounds checked- equal and bilateral Secured at: 25 cm Tube secured with: Tape Dental Injury: Teeth and Oropharynx as per pre-operative assessment  Difficulty Due To: Difficulty was anticipated Future Recommendations: Recommend- induction with short-acting agent, and alternative techniques readily available Comments: Some airway distortion noted 2nd to disease process/recommend glidescope

## 2013-12-25 NOTE — Progress Notes (Signed)
Patient brought up from PACU and report given by Jaynie Crumble. Patient lying down and stable.

## 2013-12-25 NOTE — Anesthesia Postprocedure Evaluation (Signed)
  Anesthesia Post-op Note  Patient: Johnny Sims  Procedure(s) Performed: Procedure(s): SUBMANDIBULAR GLAND STONE AND CHRONIC SIALOLITHIASIS EXCISION (Left)  Patient Location: PACU  Anesthesia Type:General  Level of Consciousness: awake and alert   Airway and Oxygen Therapy: Patient Spontanous Breathing  Post-op Pain: mild  Post-op Assessment: Post-op Vital signs reviewed, Patient's Cardiovascular Status Stable and Respiratory Function Stable  Post-op Vital Signs: Reviewed  Filed Vitals:   12/25/13 1215  BP: 152/80  Pulse: 85  Temp:   Resp: 15    Complications: No apparent anesthesia complications

## 2013-12-25 NOTE — Brief Op Note (Signed)
12/25/2013  11:16 AM  PATIENT:  Johnny Sims  58 y.o. male  PRE-OPERATIVE DIAGNOSIS:  chronic sialolithiasis, left submandibular gland stone  POST-OPERATIVE DIAGNOSIS:  chronic sialolithiasis, left submandibular gland stone  PROCEDURE:  Procedure(s) with comments: EXCISION SUBMANDIBULAR GLAND (Left) - Left Submandibular Gland Stone and Chronic Sialolithiasis Excision  SURGEON:  Surgeon(s) and Role:    * Melida Quitter, MD - Primary  PHYSICIAN ASSISTANT: [place]bladh  ASSISTANTS: none   ANESTHESIA:   general  EBL:  Total I/O In: 1200 [I.V.:1200] Out: -   BLOOD ADMINISTERED:none  DRAINS: (7 Fr) Jackson-Pratt drain(s) with closed bulb suction in the left neck   LOCAL MEDICATIONS USED:  LIDOCAINE   SPECIMEN:  Source of Specimen:  Left submandibular gland and stones  DISPOSITION OF SPECIMEN:  PATHOLOGY  COUNTS:  YES  TOURNIQUET:  * No tourniquets in log *  DICTATION: .Other Dictation: Dictation Number [id number]  PLAN OF CARE: Admit for overnight observation  PATIENT DISPOSITION:  PACU - hemodynamically stable.   Delay start of Pharmacological VTE agent (>24hrs) due to surgical blood loss or risk of bleeding: no

## 2013-12-25 NOTE — Progress Notes (Signed)
Pt belongings taken to Pacu pt informed.

## 2013-12-26 LAB — CBC WITH DIFFERENTIAL/PLATELET
BASOS PCT: 0 % (ref 0–1)
Basophils Absolute: 0 10*3/uL (ref 0.0–0.1)
Eosinophils Absolute: 0.3 10*3/uL (ref 0.0–0.7)
Eosinophils Relative: 3 % (ref 0–5)
HCT: 38.8 % — ABNORMAL LOW (ref 39.0–52.0)
HEMOGLOBIN: 13.1 g/dL (ref 13.0–17.0)
LYMPHS ABS: 3.4 10*3/uL (ref 0.7–4.0)
Lymphocytes Relative: 34 % (ref 12–46)
MCH: 28.9 pg (ref 26.0–34.0)
MCHC: 33.8 g/dL (ref 30.0–36.0)
MCV: 85.7 fL (ref 78.0–100.0)
MONOS PCT: 10 % (ref 3–12)
Monocytes Absolute: 1 10*3/uL (ref 0.1–1.0)
NEUTROS ABS: 5.2 10*3/uL (ref 1.7–7.7)
NEUTROS PCT: 53 % (ref 43–77)
Platelets: 229 10*3/uL (ref 150–400)
RBC: 4.53 MIL/uL (ref 4.22–5.81)
RDW: 13.7 % (ref 11.5–15.5)
WBC: 9.9 10*3/uL (ref 4.0–10.5)

## 2013-12-26 LAB — GLUCOSE, CAPILLARY
GLUCOSE-CAPILLARY: 187 mg/dL — AB (ref 70–99)
GLUCOSE-CAPILLARY: 149 mg/dL — AB (ref 70–99)
GLUCOSE-CAPILLARY: 182 mg/dL — AB (ref 70–99)
Glucose-Capillary: 171 mg/dL — ABNORMAL HIGH (ref 70–99)

## 2013-12-26 MED ORDER — OXYCODONE HCL 30 MG PO TABS: 30.0000 mg | ORAL_TABLET | Freq: Every day | ORAL | Status: DC

## 2013-12-26 MED ORDER — CLINDAMYCIN HCL 300 MG PO CAPS: 300.0000 mg | ORAL_CAPSULE | Freq: Three times a day (TID) | ORAL | Status: DC

## 2013-12-26 MED ORDER — MORPHINE SULFATE 4 MG/ML IJ SOLN
8.0000 mg | INTRAMUSCULAR | Status: DC | PRN
  Administered 2013-12-26 – 2013-12-27 (×5): 8 mg via INTRAVENOUS
  Filled 2013-12-26 (×5): qty 2

## 2013-12-26 NOTE — Progress Notes (Signed)
Chaplain received referral that patient requested prayer. 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 He expressed gratitude that his physician prayed with him and asked chaplain for prayer. After prayer, patient thanked chaplain and said he had no other needs. Chaplain will refer to unit chaplain for follow up support. Please page if requested.   Ethelene Browns [phone]

## 2013-12-26 NOTE — H&P (Signed)
Patient stating he is in a lot of pain and asking if history can be completed later.

## 2013-12-26 NOTE — Progress Notes (Signed)
Patient ID: Johnny Sims, male   DOB: Jul 08, 1955, 58 y.o.   MRN: [id number] Pain seems better controlled.  He complains of weakness in the legs. AFVSS Alert NAD. Left neck remains full and tender, drain continues to function with serosanguinous fluid. A/P: s/p left submandibular gland resection Left neck fullness may represent edema or hematoma.  Drain functioning.  No fever today.  WBC normal this morning.  Will keep him here tonight with continued IV clindamycin and pain control.  Likely discharge tomorrow if remains stable.

## 2013-12-26 NOTE — Progress Notes (Signed)
UR completed, changed to inpatient status d/t being on IV Cleocin, IV morphine and IVF.  Allene Dillon, [place]

## 2013-12-26 NOTE — Progress Notes (Signed)
1 Day Post-Op  Subjective: Patient says he had a rough night.  Having pain in the left neck.  Objective: Vital signs in last 24 hours: Temp:  [97.4 F (36.3 C)-100.4 F (38 C)] 100.4 F (38 C) (07/02 0557) Pulse Rate:  [80-123] 90 (07/02 0557) Resp:  [12-22] 20 (07/02 0557) BP: (116-159)/(72-87) 132/87 mmHg (07/02 0557) SpO2:  [94 %-97 %] 97 % (07/02 0823) Last BM Date:  (no value)  Intake/Output from previous day: 07/01 0701 - 07/02 0700 In: 2673.8 [I.V.:2623.8; IV Piggyback:50] Out: 155 [Drains:55; Blood:100] Intake/Output this shift:    General appearance: alert, cooperative and no distress Neck: Left neck incision clean and intact, drain functioning, serosanguinous fluid, left neck edema, left lower lip moving normally.  Lab Results:   Recent Labs  12/25/13 0633  WBC 7.7  HGB 13.2  HCT 40.0  PLT 240   BMET  Recent Labs  12/25/13 0633  NA 138  K 4.2  CL 100  CO2 25  GLUCOSE 133*  BUN 15  CREATININE 1.09  CALCIUM 9.0   PT/INR  Recent Labs  12/25/13 0633  LABPROT 13.5  INR 1.03   ABG No results found for this basename: PHART, PCO2, PO2, HCO3,  in the last 72 hours  Studies/Results: Dg Chest 2 View  12/25/2013   CLINICAL DATA:  Preop.  History of hypertension.  EXAM: CHEST  2 VIEW  COMPARISON:  02/19/2010  FINDINGS: The posterior costophrenic sulci obscured from view.  Otherwise:  Normal heart size and mediastinal contours. No acute infiltrate or edema. No effusion or pneumothorax. No acute osseous findings.  IMPRESSION: No active cardiopulmonary disease.   Electronically Signed   By: Jorje Guild M.D.   On: 12/25/2013 06:40    Anti-infectives: Anti-infectives   Start     Dose/Rate Route Frequency Ordered Stop   12/25/13 1400  clindamycin (CLEOCIN) IVPB 600 mg     600 mg 100 mL/hr over 30 Minutes Intravenous 3 times per day 12/25/13 1250        Assessment/Plan: s/p Procedure(s): SUBMANDIBULAR GLAND STONE AND CHRONIC SIALOLITHIASIS  EXCISION (Left) Low grade fever overnight and left neck edematous.  Will check CBC and leave drain in place this morning.  Will increase morphine dose.  Will reassess later today.  LOS: 1 day    Kaisen Ackers 12/26/2013

## 2013-12-27 LAB — GLUCOSE, CAPILLARY: Glucose-Capillary: 169 mg/dL — ABNORMAL HIGH (ref 70–99)

## 2013-12-27 NOTE — Progress Notes (Signed)
Discussed discharge summary with patient. Reviewed all medications with patient. Pt received Rx. Pt did not have any questions. Pt ready for discharge.

## 2013-12-27 NOTE — Discharge Summary (Signed)
Physician Discharge Summary  Patient ID: Johnny Sims MRN: [id number] DOB/AGE: 12/19/1955 58 y.o.  Admit date: 12/25/2013 Discharge date: 12/27/2013  Admission Diagnoses:  Active Problems:   Sialolithiasis   Discharge Diagnoses:  Same  Surgeries: Procedure(s): SUBMANDIBULAR GLAND STONE AND CHRONIC SIALOLITHIASIS EXCISION on 12/25/2013   Consultants: none  Discharged Condition: Improved  Hospital Course: RANDIE TALLARICO is an 58 y.o. male who was admitted 12/25/2013 with a diagnosis of chronic sialolithiasis and went to the operating room on 12/25/2013 and underwent the above named procedures.   Pt stable post op and d/c'ed on POD#2.  Recent vital signs:  Filed Vitals:   12/27/13 0529  BP: 93/78  Pulse: 111  Temp: 98.5 F (36.9 C)  Resp: 18    Recent laboratory studies:  Results for orders placed during the hospital encounter of 12/25/13  COMPREHENSIVE METABOLIC PANEL      Result Value Ref Range   Sodium 138  137 - 147 mEq/L   Potassium 4.2  3.7 - 5.3 mEq/L   Chloride 100  96 - 112 mEq/L   CO2 25  19 - 32 mEq/L   Glucose, Bld 133 (*) 70 - 99 mg/dL   BUN 15  6 - 23 mg/dL   Creatinine, Ser 1.09  0.50 - 1.35 mg/dL   Calcium 9.0  8.4 - 10.5 mg/dL   Total Protein 7.5  6.0 - 8.3 g/dL   Albumin 3.4 (*) 3.5 - 5.2 g/dL   AST 20  0 - 37 U/L   ALT 16  0 - 53 U/L   Alkaline Phosphatase 55  39 - 117 U/L   Total Bilirubin <0.2 (*) 0.3 - 1.2 mg/dL   GFR calc non Af Amer 74 (*) >90 mL/min   GFR calc Af Amer 85 (*) >90 mL/min  PROTIME-INR      Result Value Ref Range   Prothrombin Time 13.5  11.6 - 15.2 seconds   INR 1.03  0.00 - 1.49  CBC      Result Value Ref Range   WBC 7.7  4.0 - 10.5 K/uL   RBC 4.55  4.22 - 5.81 MIL/uL   Hemoglobin 13.2  13.0 - 17.0 g/dL   HCT 40.0  39.0 - 52.0 %   MCV 87.9  78.0 - 100.0 fL   MCH 29.0  26.0 - 34.0 pg   MCHC 33.0  30.0 - 36.0 g/dL   RDW 13.9  11.5 - 15.5 %   Platelets 240  150 - 400 K/uL  GLUCOSE, CAPILLARY      Result Value Ref Range    Glucose-Capillary 140 (*) 70 - 99 mg/dL  GLUCOSE, CAPILLARY      Result Value Ref Range   Glucose-Capillary 139 (*) 70 - 99 mg/dL   Comment 1 Documented in Chart     Comment 2 Notify RN    GLUCOSE, CAPILLARY      Result Value Ref Range   Glucose-Capillary 199 (*) 70 - 99 mg/dL  GLUCOSE, CAPILLARY      Result Value Ref Range   Glucose-Capillary 192 (*) 70 - 99 mg/dL   Comment 1 Notify RN    GLUCOSE, CAPILLARY      Result Value Ref Range   Glucose-Capillary 187 (*) 70 - 99 mg/dL  CBC WITH DIFFERENTIAL      Result Value Ref Range   WBC 9.9  4.0 - 10.5 K/uL   RBC 4.53  4.22 - 5.81 MIL/uL   Hemoglobin 13.1  13.0 -  17.0 g/dL   HCT 38.8 (*) 39.0 - 52.0 %   MCV 85.7  78.0 - 100.0 fL   MCH 28.9  26.0 - 34.0 pg   MCHC 33.8  30.0 - 36.0 g/dL   RDW 13.7  11.5 - 15.5 %   Platelets 229  150 - 400 K/uL   Neutrophils Relative % 53  43 - 77 %   Neutro Abs 5.2  1.7 - 7.7 K/uL   Lymphocytes Relative 34  12 - 46 %   Lymphs Abs 3.4  0.7 - 4.0 K/uL   Monocytes Relative 10  3 - 12 %   Monocytes Absolute 1.0  0.1 - 1.0 K/uL   Eosinophils Relative 3  0 - 5 %   Eosinophils Absolute 0.3  0.0 - 0.7 K/uL   Basophils Relative 0  0 - 1 %   Basophils Absolute 0.0  0.0 - 0.1 K/uL  GLUCOSE, CAPILLARY      Result Value Ref Range   Glucose-Capillary 171 (*) 70 - 99 mg/dL  GLUCOSE, CAPILLARY      Result Value Ref Range   Glucose-Capillary 149 (*) 70 - 99 mg/dL  GLUCOSE, CAPILLARY      Result Value Ref Range   Glucose-Capillary 182 (*) 70 - 99 mg/dL  GLUCOSE, CAPILLARY      Result Value Ref Range   Glucose-Capillary 169 (*) 70 - 99 mg/dL    Discharge Medications:     Medication List         amLODipine 5 MG tablet  Commonly known as:  NORVASC  Take 5 mg by mouth daily.     cholecalciferol 1000 UNITS tablet  Commonly known as:  VITAMIN D  Take 2,000 Units by mouth every evening.     clindamycin 300 MG capsule  Commonly known as:  CLEOCIN  Take 300 mg by mouth 3 (three) times daily.      clindamycin 300 MG capsule  Commonly known as:  CLEOCIN  Take 1 capsule (300 mg total) by mouth 3 (three) times daily.     diazepam 10 MG tablet  Commonly known as:  VALIUM  Take 10 mg by mouth 4 (four) times daily as needed. For anxiety     fluticasone 50 MCG/ACT nasal spray  Commonly known as:  FLONASE  Place 1 spray into both nostrils daily as needed for allergies.     insulin glargine 100 unit/mL Sopn  Commonly known as:  LANTUS  Inject 27 Units into the skin at bedtime. PEN     levothyroxine 88 MCG tablet  Commonly known as:  SYNTHROID, LEVOTHROID  Take 88 mcg by mouth daily.     lisinopril 40 MG tablet  Commonly known as:  PRINIVIL,ZESTRIL  Take 40 mg by mouth daily.     loratadine 10 MG tablet  Commonly known as:  CLARITIN  Take 10 mg by mouth daily as needed for allergies.     methocarbamol 500 MG tablet  Commonly known as:  ROBAXIN  Take 500 mg by mouth at bedtime as needed for muscle spasms.     metoprolol succinate 50 MG 24 hr tablet  Commonly known as:  TOPROL-XL  Take 1 tablet (50 mg total) by mouth daily. Take with or immediately following a meal.     multivitamin tablet  Take 1 tablet by mouth every evening.     omeprazole 40 MG capsule  Commonly known as:  PRILOSEC  Take 40 mg by mouth daily.  oxycodone 30 MG immediate release tablet  Commonly known as:  ROXICODONE  Take 30 mg by mouth 5 (five) times daily. Take 2 tablets in the morning,1 tablet at lunch and 2 tablets in the afternoon.     oxycodone 30 MG immediate release tablet  Commonly known as:  ROXICODONE  Take 1 tablet (30 mg total) by mouth 5 (five) times daily.     penicillin v potassium 500 MG tablet  Commonly known as:  VEETID  Take 500 mg by mouth 4 (four) times daily as needed (for flare up in legs).     promethazine 25 MG suppository  Commonly known as:  PHENERGAN  Place 25 mg rectally every 6 (six) hours as needed for nausea or vomiting.     simvastatin 20 MG tablet   Commonly known as:  ZOCOR  Take 20 mg by mouth every evening.     tiotropium 18 MCG inhalation capsule  Commonly known as:  SPIRIVA  Place 18 mcg into inhaler and inhale daily.     warfarin 7.5 MG tablet  Commonly known as:  COUMADIN  Take 7.5 mg by mouth See admin instructions. Take 1 tablet (7.5 mg) on Sunday, Tuesday and Thursday (take 10 mg tablet on all other days) Must be brand name     warfarin 10 MG tablet  Commonly known as:  COUMADIN  Take 10 mg by mouth See admin instructions. Take 1 tablet (10 mg) on Monday, Wednesday, Friday and Saturday (take 7.5 mg tablet on all other days) Must be brand name        Diagnostic Studies: Dg Chest 2 View  12/25/2013   CLINICAL DATA:  Preop.  History of hypertension.  EXAM: CHEST  2 VIEW  COMPARISON:  02/19/2010  FINDINGS: The posterior costophrenic sulci obscured from view.  Otherwise:  Normal heart size and mediastinal contours. No acute infiltrate or edema. No effusion or pneumothorax. No acute osseous findings.  IMPRESSION: No active cardiopulmonary disease.   Electronically Signed   By: Jorje Guild M.D.   On: 12/25/2013 06:40    Disposition: 01-Home or Self Care      Discharge Instructions   Diet - low sodium heart healthy    Complete by:  As directed      Diet - low sodium heart healthy    Complete by:  As directed      Discharge instructions    Complete by:  As directed   Do not apply ointment to the incision.  OK to get incision wet, gently pat dry.     Discharge instructions    Complete by:  As directed   1. Limited activity 2. Liquid and soft diet, advance as tolerated 3. May bathe and shower day after surgery 4. Wound care - Gentle cleaning with soap and water 5. DO NOT APPLY ANY OINTMENT 6. Elevate Head of Bed     Increase activity slowly    Complete by:  As directed      Increase activity slowly    Complete by:  As directed            Follow-up Information   Follow up with BATES, DWIGHT, MD. Schedule an  appointment as soon as possible for a visit on 12/30/2013.   Specialty:  Otolaryngology   Contact information:   [address] [place] [address] [phone]        Signed: Jerrell Belfast 12/27/2013, 9:10 AM

## 2013-12-27 NOTE — Progress Notes (Signed)
   ENT Progress Note: POD #2 s/p Procedure(s): SUBMANDIBULAR GLAND STONE AND CHRONIC SIALOLITHIASIS EXCISION   Subjective: Decreased pain and swelling  Objective: Vital signs in last 24 hours: Temp:  [98.3 F (36.8 C)-100.1 F (37.8 C)] 98.5 F (36.9 C) (07/03 0529) Pulse Rate:  [58-111] 111 (07/03 0529) Resp:  [17-18] 18 (07/03 0529) BP: (93-131)/(60-81) 93/78 mmHg (07/03 0529) SpO2:  [93 %-99 %] 97 % (07/03 0828) Weight change:  Last BM Date:  (PTA)  Intake/Output from previous day: 07/02 0701 - 07/03 0700 In: -  Out: 30 [Drains:30] Intake/Output this shift:    Labs:  Recent Labs  12/25/13 0633 12/26/13 1015  WBC 7.7 9.9  HGB 13.2 13.1  HCT 40.0 38.8*  PLT 240 229    Recent Labs  12/25/13 0633  NA 138  K 4.2  CL 100  CO2 25  GLUCOSE 133*  BUN 15  CALCIUM 9.0    Studies/Results: No results found.   PHYSICAL EXAM: Inc intact, mild swelling, no erythema JP removed   Assessment/Plan: Pt stable postop tol po D/C to home    Hemlock, Aamna Mallozzi 12/27/2013, 9:05 AM

## 2013-12-30 ENCOUNTER — Encounter (HOSPITAL_COMMUNITY): Payer: Self-pay | Admitting: Otolaryngology

## 2014-02-04 ENCOUNTER — Emergency Department (HOSPITAL_COMMUNITY)
Admission: EM | Admit: 2014-02-04 | Discharge: 2014-02-04 | Disposition: A | Discharge status: Home / Self Care | Payer: Medicaid Other | Source: Home / Self Care

## 2014-02-04 NOTE — ED Notes (Signed)
Pt  Not  In  Waiting  Room     Registration staff  Saw  Him leave  Stating  i   Will be  back

## 2014-02-04 NOTE — ED Notes (Signed)
Unable to locate

## 2014-03-25 ENCOUNTER — Other Ambulatory Visit: Payer: Self-pay | Admitting: Cardiovascular Disease

## 2014-06-25 ENCOUNTER — Other Ambulatory Visit: Payer: Self-pay | Admitting: Cardiovascular Disease

## 2014-07-16 ENCOUNTER — Ambulatory Visit (INDEPENDENT_AMBULATORY_CARE_PROVIDER_SITE_OTHER): Payer: Medicaid Other | Admitting: *Deleted

## 2014-07-16 DIAGNOSIS — I480 Paroxysmal atrial fibrillation: Secondary | ICD-10-CM

## 2014-07-16 LAB — POCT INR: INR: 1.9

## 2014-07-23 ENCOUNTER — Emergency Department (HOSPITAL_COMMUNITY): Payer: Medicaid Other

## 2014-07-23 ENCOUNTER — Encounter (HOSPITAL_COMMUNITY): Payer: Self-pay | Admitting: Radiology

## 2014-07-23 ENCOUNTER — Emergency Department (HOSPITAL_COMMUNITY)
Admission: EM | Admit: 2014-07-23 | Discharge: 2014-07-23 | Disposition: A | Discharge status: Home / Self Care | Payer: Medicaid Other | Attending: Emergency Medicine | Admitting: Emergency Medicine

## 2014-07-23 DIAGNOSIS — G8929 Other chronic pain: Secondary | ICD-10-CM | POA: Insufficient documentation

## 2014-07-23 DIAGNOSIS — Z79899 Other long term (current) drug therapy: Secondary | ICD-10-CM | POA: Insufficient documentation

## 2014-07-23 DIAGNOSIS — Z8619 Personal history of other infectious and parasitic diseases: Secondary | ICD-10-CM | POA: Diagnosis not present

## 2014-07-23 DIAGNOSIS — Z760 Encounter for issue of repeat prescription: Secondary | ICD-10-CM | POA: Diagnosis present

## 2014-07-23 DIAGNOSIS — Z794 Long term (current) use of insulin: Secondary | ICD-10-CM | POA: Diagnosis not present

## 2014-07-23 DIAGNOSIS — Z720 Tobacco use: Secondary | ICD-10-CM | POA: Insufficient documentation

## 2014-07-23 DIAGNOSIS — Z8739 Personal history of other diseases of the musculoskeletal system and connective tissue: Secondary | ICD-10-CM | POA: Diagnosis not present

## 2014-07-23 DIAGNOSIS — Z8719 Personal history of other diseases of the digestive system: Secondary | ICD-10-CM | POA: Diagnosis not present

## 2014-07-23 DIAGNOSIS — Z7901 Long term (current) use of anticoagulants: Secondary | ICD-10-CM | POA: Insufficient documentation

## 2014-07-23 DIAGNOSIS — F419 Anxiety disorder, unspecified: Secondary | ICD-10-CM | POA: Diagnosis not present

## 2014-07-23 DIAGNOSIS — R112 Nausea with vomiting, unspecified: Secondary | ICD-10-CM | POA: Diagnosis not present

## 2014-07-23 DIAGNOSIS — Z792 Long term (current) use of antibiotics: Secondary | ICD-10-CM | POA: Insufficient documentation

## 2014-07-23 DIAGNOSIS — Z85820 Personal history of malignant melanoma of skin: Secondary | ICD-10-CM | POA: Diagnosis not present

## 2014-07-23 DIAGNOSIS — E039 Hypothyroidism, unspecified: Secondary | ICD-10-CM | POA: Diagnosis not present

## 2014-07-23 DIAGNOSIS — E119 Type 2 diabetes mellitus without complications: Secondary | ICD-10-CM | POA: Diagnosis not present

## 2014-07-23 DIAGNOSIS — J449 Chronic obstructive pulmonary disease, unspecified: Secondary | ICD-10-CM | POA: Insufficient documentation

## 2014-07-23 DIAGNOSIS — I480 Paroxysmal atrial fibrillation: Secondary | ICD-10-CM | POA: Insufficient documentation

## 2014-07-23 DIAGNOSIS — E785 Hyperlipidemia, unspecified: Secondary | ICD-10-CM | POA: Insufficient documentation

## 2014-07-23 DIAGNOSIS — R1011 Right upper quadrant pain: Secondary | ICD-10-CM | POA: Insufficient documentation

## 2014-07-23 LAB — CBC WITH DIFFERENTIAL/PLATELET
BASOS ABS: 0 10*3/uL (ref 0.0–0.1)
BASOS PCT: 0 % (ref 0–1)
Eosinophils Absolute: 0.4 10*3/uL (ref 0.0–0.7)
Eosinophils Relative: 4 % (ref 0–5)
HCT: 39.2 % (ref 39.0–52.0)
HEMOGLOBIN: 13.2 g/dL (ref 13.0–17.0)
LYMPHS ABS: 2.7 10*3/uL (ref 0.7–4.0)
Lymphocytes Relative: 31 % (ref 12–46)
MCH: 28 pg (ref 26.0–34.0)
MCHC: 33.7 g/dL (ref 30.0–36.0)
MCV: 83.1 fL (ref 78.0–100.0)
Monocytes Absolute: 0.9 10*3/uL (ref 0.1–1.0)
Monocytes Relative: 10 % (ref 3–12)
NEUTROS PCT: 55 % (ref 43–77)
Neutro Abs: 4.9 10*3/uL (ref 1.7–7.7)
Platelets: 197 10*3/uL (ref 150–400)
RBC: 4.72 MIL/uL (ref 4.22–5.81)
RDW: 14.7 % (ref 11.5–15.5)
WBC: 9 10*3/uL (ref 4.0–10.5)

## 2014-07-23 LAB — URINE MICROSCOPIC-ADD ON

## 2014-07-23 LAB — COMPREHENSIVE METABOLIC PANEL
ALT: 21 U/L (ref 0–53)
AST: 23 U/L (ref 0–37)
Albumin: 2.6 g/dL — ABNORMAL LOW (ref 3.5–5.2)
Alkaline Phosphatase: 52 U/L (ref 39–117)
Anion gap: 6 (ref 5–15)
BILIRUBIN TOTAL: 0.5 mg/dL (ref 0.3–1.2)
BUN: 23 mg/dL (ref 6–23)
CO2: 24 mmol/L (ref 19–32)
Calcium: 8.3 mg/dL — ABNORMAL LOW (ref 8.4–10.5)
Chloride: 108 mmol/L (ref 96–112)
Creatinine, Ser: 1.52 mg/dL — ABNORMAL HIGH (ref 0.50–1.35)
GFR calc Af Amer: 57 mL/min — ABNORMAL LOW (ref >90)
GFR, EST NON AFRICAN AMERICAN: 49 mL/min — AB (ref >90)
GLUCOSE: 129 mg/dL — AB (ref 70–99)
Potassium: 4.1 mmol/L (ref 3.5–5.1)
SODIUM: 138 mmol/L (ref 135–145)
Total Protein: 5.6 g/dL — ABNORMAL LOW (ref 6.0–8.3)

## 2014-07-23 LAB — URINALYSIS, ROUTINE W REFLEX MICROSCOPIC
Glucose, UA: NEGATIVE mg/dL
Ketones, ur: 15 mg/dL — AB
Leukocytes, UA: NEGATIVE
NITRITE: NEGATIVE
SPECIFIC GRAVITY, URINE: 1.038 — AB (ref 1.005–1.030)
Urobilinogen, UA: 0.2 mg/dL (ref 0.0–1.0)
pH: 5 (ref 5.0–8.0)

## 2014-07-23 LAB — PROTIME-INR
INR: 1.47 (ref 0.00–1.49)
Prothrombin Time: 18 seconds — ABNORMAL HIGH (ref 11.6–15.2)

## 2014-07-23 LAB — LIPASE, BLOOD: Lipase: 32 U/L (ref 11–59)

## 2014-07-23 LAB — CBG MONITORING, ED: GLUCOSE-CAPILLARY: 121 mg/dL — AB (ref 70–99)

## 2014-07-23 MED ORDER — AMLODIPINE BESYLATE 5 MG PO TABS: 5.0000 mg | ORAL_TABLET | Freq: Every day | ORAL | Status: DC

## 2014-07-23 MED ORDER — LEVOTHYROXINE SODIUM 88 MCG PO TABS: 88.0000 ug | ORAL_TABLET | Freq: Every day | ORAL | Status: DC

## 2014-07-23 MED ORDER — TIOTROPIUM BROMIDE MONOHYDRATE 18 MCG IN CAPS: 18.0000 ug | ORAL_CAPSULE | Freq: Every day | RESPIRATORY_TRACT | Status: DC

## 2014-07-23 MED ORDER — LISINOPRIL 40 MG PO TABS: 40.0000 mg | ORAL_TABLET | Freq: Every day | ORAL | Status: DC

## 2014-07-23 MED ORDER — OMEPRAZOLE 40 MG PO CPDR: 40.0000 mg | DELAYED_RELEASE_CAPSULE | Freq: Every day | ORAL | Status: DC

## 2014-07-23 NOTE — ED Provider Notes (Signed)
CSN: [id number]     Arrival date & time 07/23/14  4268 History   First MD Initiated Contact with Patient 07/23/14 0901     Chief Complaint  Patient presents with  . Medication Refill     (Consider location/radiation/quality/duration/timing/severity/associated sxs/prior Treatment) Patient is a 59 y.o. male presenting with abdominal pain.  Abdominal Pain Pain location:  RUQ Pain quality: aching   Pain radiates to:  Does not radiate Pain severity:  Moderate Onset quality:  Gradual Duration:  4 days Timing:  Constant Progression:  Unchanged Chronicity:  Recurrent Context: alcohol use, previous surgery (mandibular) and recent illness (flulike symptoms)   Relieved by:  Nothing Worsened by:  Movement, palpation and position changes Associated symptoms: nausea and vomiting   Associated symptoms: no anorexia, no constipation, no cough, no diarrhea and no dysuria     Past Medical History  Diagnosis Date  . PAF (paroxysmal atrial fibrillation)   . Diabetes mellitus   . Hypothyroidism   . Hyperlipidemia   . Hypertension   . Chronic pain   . OA (osteoarthritis)   . COPD (chronic obstructive pulmonary disease)   . RA (rheumatoid arthritis)   . Hepatitis C   . Venous stasis   . Bipolar 1 disorder   . Hepatitis C   . Fibromyalgia   . Dysrhythmia     PAF  . Asthma   . Anxiety   . Melanoma     face, shoulder arm  . Pancreatitis, acute 2015   Past Surgical History  Procedure Laterality Date  . Thyroidectomy    . Tonsillectomy    . US echocardiography  02/19/2010    EF 55-60%  . Skin cancer excision      melonoma  . Fracture surgery Left     arm  . Submandibular gland excision Left 12/25/2013    w/sialolithotomy  . Inguinal hernia repair Bilateral   . Hernia repair      umbicial  . Knee surgery      /H&P 02/20/2010  . Submandibular gland excision Left 12/25/2013    Procedure: SUBMANDIBULAR GLAND STONE AND CHRONIC SIALOLITHIASIS EXCISION;  Surgeon: Melida Quitter, MD;   Location: [facility] OR;  Service: ENT;  Laterality: Left;   Family History  Problem Relation Age of Onset  . Hypertension Mother   . Alzheimer's disease Mother   . Heart disease Father    History  Substance Use Topics  . Smoking status: Current Every Day Smoker -- 1.00 packs/day for 43 years  . Smokeless tobacco: Not on file  . Alcohol Use: 0.6 oz/week    1 Cans of beer per week    Review of Systems  Respiratory: Negative for cough.   Gastrointestinal: Positive for nausea, vomiting and abdominal pain. Negative for diarrhea, constipation and anorexia.  Genitourinary: Negative for dysuria.  All other systems reviewed and are negative.     Allergies  Antihistamines, chlorpheniramine-type; Iodinated diagnostic agents; Lidocaine; Methadone; Other; Oxycodone hcl; Pentazocine lactate; Sulfa antibiotics; and Vicodin  Home Medications   Prior to Admission medications   Medication Sig Start Date End Date Taking? Authorizing Provider  amLODipine (NORVASC) 5 MG tablet Take 1 tablet (5 mg total) by mouth daily. 07/23/14   Debby Freiberg, MD  cholecalciferol (VITAMIN D) 1000 UNITS tablet Take 2,000 Units by mouth every evening.     Historical Provider, MD  clindamycin (CLEOCIN) 300 MG capsule Take 300 mg by mouth 3 (three) times daily.  07/01/13   Historical Provider, MD  clindamycin (CLEOCIN) 300 MG  capsule Take 1 capsule (300 mg total) by mouth 3 (three) times daily. 12/26/13   Melida Quitter, MD  COUMADIN 10 MG tablet TAKE 10MG  ALONG WITH COUMADIN 7.5MG  AS DIRECTED BY COUMADIN CLINIC 07/16/14   Thayer Headings, MD  COUMADIN 7.5 MG tablet TAKE 7.5MG  ALONG WITH COUMADIN 10MG  AS DIRECTED BY COUMADIN CLINIC 07/16/14   Thayer Headings, MD  diazepam (VALIUM) 10 MG tablet Take 10 mg by mouth 4 (four) times daily as needed. For anxiety    Historical Provider, MD  fluticasone (FLONASE) 50 MCG/ACT nasal spray Place 1 spray into both nostrils daily as needed for allergies.     Historical Provider, MD  insulin  glargine (LANTUS) 100 units/mL SOLN Inject 27 Units into the skin at bedtime. PEN    Historical Provider, MD  levothyroxine (SYNTHROID, LEVOTHROID) 88 MCG tablet Take 1 tablet (88 mcg total) by mouth daily. 07/23/14   Debby Freiberg, MD  lisinopril (PRINIVIL,ZESTRIL) 40 MG tablet Take 1 tablet (40 mg total) by mouth daily. 07/23/14   Debby Freiberg, MD  loratadine (CLARITIN) 10 MG tablet Take 10 mg by mouth daily as needed for allergies.     Historical Provider, MD  methocarbamol (ROBAXIN) 500 MG tablet Take 500 mg by mouth at bedtime as needed for muscle spasms.     Historical Provider, MD  metoprolol succinate (TOPROL-XL) 50 MG 24 hr tablet Take 1 tablet (50 mg total) by mouth daily. Take with or immediately following a meal. 07/16/12   Thayer Headings, MD  Multiple Vitamin (MULTIVITAMIN) tablet Take 1 tablet by mouth every evening.     Historical Provider, MD  omeprazole (PRILOSEC) 40 MG capsule Take 1 capsule (40 mg total) by mouth daily. 07/23/14   Debby Freiberg, MD  oxycodone (ROXICODONE) 30 MG immediate release tablet Take 30 mg by mouth 5 (five) times daily. Take 2 tablets in the morning,1 tablet at lunch and 2 tablets in the afternoon. 07/17/12   Thayer Headings, MD  oxyCODONE (ROXICODONE) 30 MG immediate release tablet Take 1 tablet (30 mg total) by mouth 5 (five) times daily. 12/26/13   Melida Quitter, MD  penicillin v potassium (VEETID) 500 MG tablet Take 500 mg by mouth 4 (four) times daily as needed (for flare up in legs).     Historical Provider, MD  promethazine (PHENERGAN) 25 MG suppository Place 25 mg rectally every 6 (six) hours as needed for nausea or vomiting.    Historical Provider, MD  simvastatin (ZOCOR) 20 MG tablet Take 20 mg by mouth every evening.     Historical Provider, MD  tiotropium (SPIRIVA) 18 MCG inhalation capsule Place 1 capsule (18 mcg total) into inhaler and inhale daily. 07/23/14   Debby Freiberg, MD  warfarin (COUMADIN) 10 MG tablet Take 10 mg by mouth See admin  instructions. Take 1 tablet (10 mg) on Monday, Wednesday, Friday and Saturday (take 7.5 mg tablet on all other days) Must be brand name    Historical Provider, MD  warfarin (COUMADIN) 7.5 MG tablet Take 7.5 mg by mouth See admin instructions. Take 1 tablet (7.5 mg) on Sunday, Tuesday and Thursday (take 10 mg tablet on all other days) Must be brand name    Historical Provider, MD   BP 149/86 mmHg  Pulse 83  Temp(Src) 98 F (36.7 C) (Oral)  Resp 16  SpO2 100% Physical Exam  Constitutional: He is oriented to person, place, and time. He appears well-developed and well-nourished.  HENT:  Head: Normocephalic and atraumatic.  Eyes:  Conjunctivae and EOM are normal.  Neck: Normal range of motion. Neck supple.  Cardiovascular: Normal rate, regular rhythm and normal heart sounds.   Pulmonary/Chest: Effort normal and breath sounds normal. No respiratory distress.  Abdominal: He exhibits no distension. There is tenderness in the right upper quadrant. There is no rebound and no guarding.  Musculoskeletal: Normal range of motion.  Neurological: He is alert and oriented to person, place, and time.  Skin: Skin is warm and dry.  Vitals reviewed.   ED Course  Procedures (including critical care time) Labs Review Labs Reviewed  COMPREHENSIVE METABOLIC PANEL - Abnormal; Notable for the following:    Glucose, Bld 129 (*)    Creatinine, Ser 1.52 (*)    Calcium 8.3 (*)    Total Protein 5.6 (*)    Albumin 2.6 (*)    GFR calc non Af Amer 49 (*)    GFR calc Af Amer 57 (*)    All other components within normal limits  URINALYSIS, ROUTINE W REFLEX MICROSCOPIC - Abnormal; Notable for the following:    Color, Urine AMBER (*)    Specific Gravity, Urine 1.038 (*)    Hgb urine dipstick LARGE (*)    Bilirubin Urine SMALL (*)    Ketones, ur 15 (*)    Protein, ur >300 (*)    All other components within normal limits  PROTIME-INR - Abnormal; Notable for the following:    Prothrombin Time 18.0 (*)    All  other components within normal limits  URINE MICROSCOPIC-ADD ON - Abnormal; Notable for the following:    Bacteria, UA FEW (*)    Casts GRANULAR CAST (*)    All other components within normal limits  CBG MONITORING, ED - Abnormal; Notable for the following:    Glucose-Capillary 121 (*)    All other components within normal limits  CBC WITH DIFFERENTIAL/PLATELET  LIPASE, BLOOD    Imaging Review Ct Renal Stone Study  07/23/2014   CLINICAL DATA:  Right flank pain since he ran out of medicine. Right upper quadrant pain for 3 or 4 days.  EXAM: CT ABDOMEN AND PELVIS WITHOUT CONTRAST  TECHNIQUE: Multidetector CT imaging of the abdomen and pelvis was performed following the standard protocol without IV contrast.  COMPARISON:  09/03/2013  FINDINGS: The appendix is normal. No acute inflammatory changes are evident in the abdomen or pelvis. There are no urinary calculi. There is no hydronephrosis or ureteral dilatation. The abdominal aorta is normal in caliber.  There are unremarkable unenhanced appearances of the liver, spleen, adrenals and kidneys. A few calcifications are present in the pancreatic parenchyma, suggesting chronic pancreatitis. No pancreatic parenchymal lesion, atrophy or duct dilatation is evident.  The mesentery and bowel are remarkable only for extensive colonic diverticulosis. There is no evidence of diverticulitis or other acute process in the abdomen or pelvis.  No significant abnormalities are evident in the lower chest.  IMPRESSION: 1. No acute findings in the abdomen or pelvis 2. Diverticulosis 3. Pancreatic calcifications, suggesting chronic pancreatitis.   Electronically Signed   By: Andreas Newport M.D.   On: 07/23/2014 12:13     EKG Interpretation None      MDM   Final diagnoses:  RUQ abdominal pain    59 y.o. male with pertinent PMH of Hep C, submandibular gland sp resection presents with primary chief complaint of needing a medication refill, however then also  states that he has had right upper quadrant abdominal pain 4 days.  On arrival patient has  vital signs and physical exam as above. The patient does endorse right upper quadrant tenderness however while talking the patient does not seem bothered by palpation, when not talking he grimaces with very superficial palpation..    WU as above unremarkable.  Unknown source of hematuria, pt informed and told to fu with urology.  DC home with prescription for meds requested with exception of oxycodone.    I have reviewed all laboratory and imaging studies if ordered as above  1. RUQ abdominal pain         Debby Freiberg, MD 07/23/14 1746

## 2014-07-23 NOTE — ED Notes (Signed)
Patient transported to [facility]. ?

## 2014-07-23 NOTE — ED Notes (Signed)
Pt comfortable with discharge instructions, pt ambulatory and escorted to waiting room with this RN.

## 2014-07-23 NOTE — Discharge Instructions (Signed)
Abdominal Pain Many things can cause abdominal pain. Usually, abdominal pain is not caused by a disease and will improve without treatment. It can often be observed and treated at home. Your health care provider will do a physical exam and possibly order blood tests and X-rays to help determine the seriousness of your pain. However, in many cases, more time must pass before a clear cause of the pain can be found. Before that point, your health care provider may not know if you need more testing or further treatment. HOME CARE INSTRUCTIONS  Monitor your abdominal pain for any changes. The following actions may help to alleviate any discomfort you are experiencing:  Only take over-the-counter or prescription medicines as directed by your health care provider.  Do not take laxatives unless directed to do so by your health care provider.  Try a clear liquid diet (broth, tea, or water) as directed by your health care provider. Slowly move to a bland diet as tolerated. SEEK MEDICAL CARE IF:  You have unexplained abdominal pain.  You have abdominal pain associated with nausea or diarrhea.  You have pain when you urinate or have a bowel movement.  You experience abdominal pain that wakes you in the night.  You have abdominal pain that is worsened or improved by eating food.  You have abdominal pain that is worsened with eating fatty foods.  You have a fever. SEEK IMMEDIATE MEDICAL CARE IF:   Your pain does not go away within 2 hours.  You keep throwing up (vomiting).  Your pain is felt only in portions of the abdomen, such as the right side or the left lower portion of the abdomen.  You pass bloody or black tarry stools. MAKE SURE YOU:  Understand these instructions.   Will watch your condition.   Will get help right away if you are not doing well or get worse.  Document Released: 03/23/2005 Document Revised: 06/18/2013 Document Reviewed: 02/20/2013 [facility] Patient Information  2015 [place], [place]. This information is not intended to replace advice given to you by your health care provider. Make sure you discuss any questions you have with your health care provider.   Emergency Department Resource Guide 1) Find a Doctor and Pay Out of Pocket Although you won't have to find out who is covered by your insurance plan, it is a good idea to ask around and get recommendations. You will then need to call the office and see if the doctor you have chosen will accept you as a new patient and what types of options they offer for patients who are self-pay. Some doctors offer discounts or will set up payment plans for their patients who do not have insurance, but you will need to ask so you aren't surprised when you get to your appointment.  2) Contact Your Local Health Department Not all health departments have doctors that can see patients for sick visits, but many do, so it is worth a call to see if yours does. If you don't know where your local health department is, you can check in your phone book. The CDC also has a tool to help you locate your state's health department, and many state websites also have listings of all of their local health departments.  3) Find a [facility] If your illness is not likely to be very severe or complicated, you may want to try a walk in clinic. These are popping up all over the country in pharmacies, drugstores, and shopping centers. They're  usually staffed by nurse practitioners or physician assistants that have been trained to treat common illnesses and complaints. They're usually fairly quick and inexpensive. However, if you have serious medical issues or chronic medical problems, these are probably not your best option.  No Primary Care Doctor: - Call Health Connect at  [phone] - they can help you locate a primary care doctor that  accepts your insurance, provides certain services, etc. - Physician Referral Service- [phone]  Chronic  Pain Problems: Organization         Address  Phone   Notes  [facility]  [phone] Patients need to be referred by their primary care doctor.   Medication Assistance: Organization         Address  Phone   Notes  [facility] Medication [facility] [address] [place], [place] [address] [phone] --Must be a resident of [facility] -- Must have NO insurance coverage whatsoever (no Medicaid/ Medicare, etc.) -- The pt. MUST have a primary care doctor that directs their care regularly and follows them in the community   Med[facility]ist  [phone]   [employer]  [phone]    Agencies that provide inexpensive medical care: Organization         Address  Phone   Notes  [place]  [phone]   [facility]    [phone]   [facility] [place], [place] [address] [phone]   [place] [address] [place] [phone]   Planned Parenthood    [phone]   [facility]    [phone]   [place] and [place] [address], [place] Phone:  [phone], Fax:  [phone] Hours of Operation:  9 am - 6 pm, M-F.  Also accepts Medicaid/Medicare and self-pay.  [facility] for [place], [address], [place] Phone: [phone], Fax: [phone]. Hours of Operation:  8:30 am - 5:30 pm, M-F.  Also accepts Medicaid and self-pay.  [facility] [address], [place] Phone: [phone]   [place], [place], [place] [phone], Ext. [phone] Mondays & Thursdays: 7-9 AM.  First 15 patients are seen on a first come, first serve basis.    [place] Providers:  Organization         Address  Phone   Notes  [facility] [address], [place] [phone] Also  accepts self-pay patients.  [facility] [id number] [place], [place]  [phone]   [place], [address], [place] [phone]   [facility] [address] [place] [phone]   Lucianne Lei [address], [place]   [phone] Only accepts [place] Access [place] patients after they have their name applied to their card.   Self-Pay (no insurance) in [facility]:  Organization         Address  Phone   Notes  Sickle Cell Patients, [facility] Internal Medicine [place] [phone]   [facility] [place] [phone]   [facility] Urgent [place]  [place], [address], [place] [phone]   [facility]/Dr. Osei-Bonsu  [address], [place] or [address], [place] [phone] Phone number for both [place] and [place] locations is the same.  [facility] [address], [place] [phone]   [facility] [address], [place] or [address] [phone]   [facility] [address] [place] [phone], phone; [phone], fax Sees patients 1st and 3rd Saturday of every month.  Must not qualify for public or private insurance (i.e. Medicaid, Medicare, [place] Health Choice, Veterans' Benefits)  Household income should be no more than 200% of the poverty level The clinic cannot treat you if you are pregnant or think you are pregnant  Sexually transmitted diseases are not treated at the clinic.    Dental Care: Organization         Address  Phone  Notes  [facility] of [facility] [place] [phone] Accepts children up to age 33 who are enrolled in [place] or [place]; pregnant  women with a Medicaid card; and children who have applied for Medicaid or [place] Health Choice, but were declined, whose parents can pay a reduced fee at time of service.  [facility] Department of [facility]  [address], [place] [phone] Accepts children up to age 48 who are enrolled in [place] or [place]; pregnant women with a Medicaid card; and children who have applied for Medicaid or [place] Health Choice, but were declined, whose parents can pay a reduced fee at time of service.  [place] Adult Dental Access PROGRAM  [place] [phone] Patients are seen by appointment only. Walk-ins are not accepted. [place] will see patients 42 years of age and older. Monday - Tuesday (8am-5pm) Most Wednesdays (8:30-5pm) $30 per visit, cash only  [facility] Adult Dental Access PROGRAM  [address], [facility] [phone] Patients are seen by appointment only. Walk-ins are not accepted. [place] will see patients 31 years of age and older. One Wednesday Evening (Monthly: Volunteer Based).  $30 per visit, cash only  [place]  [phone] for adults; Children under age 65, call Graduate Pediatric Dentistry at [phone]. Children aged 5-14, please call [phone] to request a pediatric application.  Dental services are provided in all areas of dental care including fillings, crowns and bridges, complete and partial dentures, implants, gum treatment, root canals, and extractions. Preventive care is also provided. Treatment is provided to both adults and children. Patients are selected via a lottery and there is often a waiting list.   [facility] [address], [place]  [phone] [email or web address]   [facility] [address] [place], [place] [phone], Ext. [phone] Second and Fourth Thursday of each month, opens at 6:30 AM; Clinic ends at 9 AM.  Patients are  seen on a first-come first-served basis, and a limited number are seen during each clinic.   [facility]  [address] Hillard Danker [place], [place] [phone]   Eligibility Requirements You must have lived in [place], [place], or [place] counties for at least the last three months.   You cannot be eligible for state or federal sponsored [employer], including [employer], [place], or [employer].   You generally cannot be eligible for healthcare insurance through  your employer.    How to apply: Eligibility screenings are held every Tuesday and Wednesday afternoon from 1:00 pm until 4:00 pm. You do not need an appointment for the interview!  [facility] [address] [place], [place]   [place]  [place] Department  [place]  [phone]    Behavioral Health Resources in the Community: Intensive Outpatient Programs Organization         Address  Phone  Notes  [place]. [address] [place], [place] [phone]   [facility] Outpatient [address] [place], [place]   ADS: Alcohol & Drug Svcs [address], [place], [place]   [place] [address],  [place], [place] or [phone]   Substance Abuse Resources Organization         Address  Phone  Notes  Alcohol and Drug Services  [phone]   [place]  [phone]   [place]   Chinita Pester  ([phone]   Residential & Outpatient Substance Abuse Program  [phone]   Psychological Services Organization         Address  Phone  Notes  [facility] [place]  [place]  [phone]   [place] [address], [place] or [phone]    Mobile Crisis  Teams Organization         Address  Phone  Notes  [facility], Mobile Crisis Care Unit  [phone]   Assertive Psychotherapeutic Services  [address] [place], [place]   [place] [address], [place] [phone]    Self-Help/Support Groups Organization         Address  Phone             Notes  [place]. of [place] - variety of support groups  [place] Call for more information  Narcotics Anonymous (NA), [facility] [address], [employer] [place]  2 meetings at this location   Special educational needs teacher         Address  Phone  Notes  [facility] Residential Treatment [place],    [place]  [phone]   [facility]  [address], [place] [phone], [place], [place]   [place], [place] [phone] Admissions: 8am-3pm M-F  Incentives Substance [place] [address],    [place], [place] [phone]   [facility] [address] [place], [place], [place]   [facility] [address],  [place], [place]   [facility] Programs - Intensive Outpatient [address] Krist[address], [place], [place]   [facility].,  [place], [place] [phone] or [phone]   [facility]idential [facility] Services (RTS) [address] [place], [place] Accepts Medicaid  [facility] [place] [address],  [place] [phone] Substance Abuse/Addiction Treatment   [facility] Organization         Address  Phone  Notes  [facility]  [phone]   Domenic Schwab, PhD [address] [place], [place]   [phone] or [phone]   [place], [place] [phone]  [facility] [address], [place], [place] [phone]  Insurance/Medicaid/sponsorship through [facility]  °[facility] [address]                                    [place], [place] [phone] Therapy/tele-psych/case  °[facility] [address]  ° [place], [place] [phone]    °Dr. Arfeen  ([phone]   °[facility] [place]  [employer] [place] [facility] 1) [address], [place] °2) [address], [place] °3)  [address], [place] [phone] °([phone] ° °([phone]) [phone]   °[place] Child Abuse Hotline (336) 342-1394 or (336) [phone] (After Hours)    ° ° ° ° °

## 2014-07-23 NOTE — ED Notes (Addendum)
Pt presents via POV with c/o running out of some of his medications - pt reports his PCP left their practice and he has been unable to obtain refills since. Pt reports has been feeling lightheaded and having RUQ pain x3-4 days but denies any other issues. Pt requesting: Spiriva, Omeprazole 40mg , Lisinipril 40mg , Amlodipine 5mg , Levothyroxine 37mcg, and Oxycodone 30mg 

## 2014-08-16 ENCOUNTER — Encounter (HOSPITAL_COMMUNITY): Payer: Self-pay | Admitting: Emergency Medicine

## 2014-08-16 ENCOUNTER — Emergency Department (HOSPITAL_COMMUNITY)
Admission: EM | Admit: 2014-08-16 | Discharge: 2014-08-16 | Disposition: A | Discharge status: Home / Self Care | Payer: Medicaid Other | Attending: Emergency Medicine | Admitting: Emergency Medicine

## 2014-08-16 DIAGNOSIS — Z794 Long term (current) use of insulin: Secondary | ICD-10-CM | POA: Diagnosis not present

## 2014-08-16 DIAGNOSIS — R2242 Localized swelling, mass and lump, left lower limb: Secondary | ICD-10-CM | POA: Insufficient documentation

## 2014-08-16 DIAGNOSIS — Z792 Long term (current) use of antibiotics: Secondary | ICD-10-CM | POA: Insufficient documentation

## 2014-08-16 DIAGNOSIS — I10 Essential (primary) hypertension: Secondary | ICD-10-CM | POA: Insufficient documentation

## 2014-08-16 DIAGNOSIS — Z8619 Personal history of other infectious and parasitic diseases: Secondary | ICD-10-CM | POA: Insufficient documentation

## 2014-08-16 DIAGNOSIS — M069 Rheumatoid arthritis, unspecified: Secondary | ICD-10-CM | POA: Insufficient documentation

## 2014-08-16 DIAGNOSIS — F319 Bipolar disorder, unspecified: Secondary | ICD-10-CM | POA: Diagnosis not present

## 2014-08-16 DIAGNOSIS — Z7902 Long term (current) use of antithrombotics/antiplatelets: Secondary | ICD-10-CM | POA: Diagnosis not present

## 2014-08-16 DIAGNOSIS — G8929 Other chronic pain: Secondary | ICD-10-CM | POA: Insufficient documentation

## 2014-08-16 DIAGNOSIS — E119 Type 2 diabetes mellitus without complications: Secondary | ICD-10-CM | POA: Diagnosis not present

## 2014-08-16 DIAGNOSIS — Z760 Encounter for issue of repeat prescription: Secondary | ICD-10-CM | POA: Diagnosis not present

## 2014-08-16 DIAGNOSIS — Z7901 Long term (current) use of anticoagulants: Secondary | ICD-10-CM | POA: Diagnosis not present

## 2014-08-16 DIAGNOSIS — Z8719 Personal history of other diseases of the digestive system: Secondary | ICD-10-CM | POA: Diagnosis not present

## 2014-08-16 DIAGNOSIS — Z7951 Long term (current) use of inhaled steroids: Secondary | ICD-10-CM | POA: Diagnosis not present

## 2014-08-16 DIAGNOSIS — F419 Anxiety disorder, unspecified: Secondary | ICD-10-CM | POA: Insufficient documentation

## 2014-08-16 DIAGNOSIS — Z85828 Personal history of other malignant neoplasm of skin: Secondary | ICD-10-CM | POA: Diagnosis not present

## 2014-08-16 DIAGNOSIS — E785 Hyperlipidemia, unspecified: Secondary | ICD-10-CM | POA: Insufficient documentation

## 2014-08-16 DIAGNOSIS — Z720 Tobacco use: Secondary | ICD-10-CM | POA: Diagnosis not present

## 2014-08-16 DIAGNOSIS — E039 Hypothyroidism, unspecified: Secondary | ICD-10-CM | POA: Insufficient documentation

## 2014-08-16 DIAGNOSIS — J449 Chronic obstructive pulmonary disease, unspecified: Secondary | ICD-10-CM | POA: Insufficient documentation

## 2014-08-16 DIAGNOSIS — M7989 Other specified soft tissue disorders: Secondary | ICD-10-CM | POA: Diagnosis not present

## 2014-08-16 MED ORDER — METOPROLOL SUCCINATE ER 50 MG PO TB24: 50.0000 mg | ORAL_TABLET | Freq: Every day | ORAL | Status: DC

## 2014-08-16 MED ORDER — LISINOPRIL 40 MG PO TABS: 40.0000 mg | ORAL_TABLET | Freq: Every day | ORAL | Status: DC

## 2014-08-16 MED ORDER — LORATADINE 10 MG PO TABS: 10.0000 mg | ORAL_TABLET | Freq: Every day | ORAL | Status: DC | PRN

## 2014-08-16 MED ORDER — INSULIN GLARGINE 100 UNITS/ML SOLOSTAR PEN
27.0000 [IU] | PEN_INJECTOR | Freq: Every day | SUBCUTANEOUS | Status: DC
Start: 2014-08-16 — End: 2014-10-17

## 2014-08-16 MED ORDER — TIOTROPIUM BROMIDE MONOHYDRATE 18 MCG IN CAPS: 18.0000 ug | ORAL_CAPSULE | Freq: Every day | RESPIRATORY_TRACT | Status: DC

## 2014-08-16 MED ORDER — LEVOTHYROXINE SODIUM 88 MCG PO TABS: 88.0000 ug | ORAL_TABLET | Freq: Every day | ORAL | Status: DC

## 2014-08-16 MED ORDER — WARFARIN SODIUM 10 MG PO TABS: 10.0000 mg | ORAL_TABLET | ORAL | Status: DC

## 2014-08-16 MED ORDER — OMEPRAZOLE 40 MG PO CPDR: 40.0000 mg | DELAYED_RELEASE_CAPSULE | Freq: Every day | ORAL | Status: DC

## 2014-08-16 MED ORDER — AMLODIPINE BESYLATE 5 MG PO TABS: 5.0000 mg | ORAL_TABLET | Freq: Every day | ORAL | Status: DC

## 2014-08-16 MED ORDER — SIMVASTATIN 20 MG PO TABS: 20.0000 mg | ORAL_TABLET | Freq: Every evening | ORAL | Status: DC

## 2014-08-16 MED ORDER — WARFARIN SODIUM 7.5 MG PO TABS: 7.5000 mg | ORAL_TABLET | ORAL | Status: DC

## 2014-08-16 NOTE — Progress Notes (Signed)
VASCULAR LAB PRELIMINARY  PRELIMINARY  PRELIMINARY  PRELIMINARY  Left lower extremity venous duplex  completed.    Preliminary report:  Left:  No evidence of DVT, superficial thrombosis, or Baker's cyst.   Lymph nodes noted in groin bilaterally.   Geniece Akers, RVT 08/16/2014, 12:29 PM

## 2014-08-16 NOTE — Progress Notes (Signed)
ED CM received consult from Dr. Samule Dry concerning patient coming to the ED  For medication refills. Reviewed patient's record, PCP not listed/ Medicaid Insurance.  Met with patient, and confirmed information.  Patient reports not having a PCP, apparently he retired back in September.  CM discussed with patient how he find an a PCP, patient verbalized his [facility]. Information provided, offered to arrange to schedule an appointment, patient is agreeable Wednesday 2/24 at 2p. Patient received prescriptions in the ED for medications which was walked [facility] the prescriptions were filled.  Patient verbalized not having transportation to get to the clinic. Contacted the Medicaid transportation explained that a representative will contact him before appointment to set up a pick up time, at the number verify in record. Provided patient with my contact information should any further questions or concerns should arise. Updated Dr. Samule Dry on disposition plan she is agreeable.

## 2014-08-16 NOTE — ED Notes (Signed)
Pt c/o left leg swelling with pain that comes and goes. Pt stated that he has been unable to go to MD that he was sent to because the transportation company was late picking pt up. Stated that he needs all of his regular medications refilled.

## 2014-08-16 NOTE — Discharge Instructions (Signed)
Please follow the directions provided. Be sure to follow-up with your primary care provider as instructed by Mariann Laster, from care management. Take your prescriptions as directed. Your ultrasound was negative for any sign of clot in that left leg. Be sure to discuss your leg swelling and any further medication refills you need with your primary care provider. Don't hesitate to return for any new, worsening, or concerning symptoms.   SEEK IMMEDIATE MEDICAL CARE IF:  You have increased swelling, pain, redness, or heat in your legs.  You develop shortness of breath, especially when lying down.  You develop chest or abdominal pain, weakness, or fainting.  You have a fever.

## 2014-08-16 NOTE — ED Provider Notes (Signed)
CSN: [id number]     Arrival date & time 08/16/14  1108 History   First MD Initiated Contact with Patient 08/16/14 1120     Chief Complaint  Patient presents with  . Leg Pain  . Medication Refill   (Consider location/radiation/quality/duration/timing/severity/associated sxs/prior Treatment) HPI  Johnny Sims is a 59 year old male presenting with request for medication refills. He also states that his left lower leg has been more swollen than the right 6 days. He was seen last month for medication refills and reports Dr. Colin Rhein told him if he had difficulty getting his medicines to return to the emergency department. He reports his left calf began to be more swollen and more painful 6 days ago. He rates his pain is 8 out of 10. He denies any recent extended travel, trauma, surgery, chest pain or shortness of breath.  Past Medical History  Diagnosis Date  . PAF (paroxysmal atrial fibrillation)   . Diabetes mellitus   . Hypothyroidism   . Hyperlipidemia   . Hypertension   . Chronic pain   . OA (osteoarthritis)   . COPD (chronic obstructive pulmonary disease)   . RA (rheumatoid arthritis)   . Hepatitis C   . Venous stasis   . Bipolar 1 disorder   . Hepatitis C   . Fibromyalgia   . Dysrhythmia     PAF  . Asthma   . Anxiety   . Melanoma     face, shoulder arm  . Pancreatitis, acute 2015   Past Surgical History  Procedure Laterality Date  . Thyroidectomy    . Tonsillectomy    . US echocardiography  02/19/2010    EF 55-60%  . Skin cancer excision      melonoma  . Fracture surgery Left     arm  . Submandibular gland excision Left 12/25/2013    w/sialolithotomy  . Inguinal hernia repair Bilateral   . Hernia repair      umbicial  . Knee surgery      /H&P 02/20/2010  . Submandibular gland excision Left 12/25/2013    Procedure: SUBMANDIBULAR GLAND STONE AND CHRONIC SIALOLITHIASIS EXCISION;  Surgeon: Melida Quitter, MD;  Location: [facility] OR;  Service: ENT;  Laterality: Left;   Family  History  Problem Relation Age of Onset  . Hypertension Mother   . Alzheimer's disease Mother   . Heart disease Father    History  Substance Use Topics  . Smoking status: Current Every Day Smoker -- 1.00 packs/day for 43 years  . Smokeless tobacco: Not on file  . Alcohol Use: 0.6 oz/week    1 Cans of beer per week    Review of Systems  Constitutional: Negative for fever and chills.  HENT: Negative for sore throat.   Eyes: Negative for visual disturbance.  Respiratory: Negative for cough and shortness of breath.   Cardiovascular: Positive for leg swelling. Negative for chest pain.  Gastrointestinal: Negative for nausea, vomiting and diarrhea.  Genitourinary: Negative for dysuria.  Musculoskeletal: Negative for myalgias.  Skin: Negative for rash.  Neurological: Negative for weakness, numbness and headaches.    Allergies  Antihistamines, chlorpheniramine-type; Iodinated diagnostic agents; Lidocaine; Methadone; Other; Oxycodone hcl; Pentazocine lactate; Sulfa antibiotics; and Vicodin  Home Medications   Prior to Admission medications   Medication Sig Start Date End Date Taking? Authorizing Provider  amLODipine (NORVASC) 5 MG tablet Take 1 tablet (5 mg total) by mouth daily. 08/16/14   Britt Bottom, NP  cholecalciferol (VITAMIN D) 1000 UNITS tablet Take 2,000 Units  by mouth every evening.     Historical Provider, MD  clindamycin (CLEOCIN) 300 MG capsule Take 300 mg by mouth 3 (three) times daily.  07/01/13   Historical Provider, MD  clindamycin (CLEOCIN) 300 MG capsule Take 1 capsule (300 mg total) by mouth 3 (three) times daily. 12/26/13   Melida Quitter, MD  COUMADIN 10 MG tablet TAKE 10MG  ALONG WITH COUMADIN 7.5MG  AS DIRECTED BY COUMADIN CLINIC 07/16/14   Thayer Headings, MD  COUMADIN 7.5 MG tablet TAKE 7.5MG  ALONG WITH COUMADIN 10MG  AS DIRECTED BY COUMADIN CLINIC 07/16/14   Thayer Headings, MD  diazepam (VALIUM) 10 MG tablet Take 10 mg by mouth 4 (four) times daily as needed. For  anxiety    Historical Provider, MD  fluticasone (FLONASE) 50 MCG/ACT nasal spray Place 1 spray into both nostrils daily as needed for allergies.     Historical Provider, MD  insulin glargine (LANTUS) 100 unit/mL SOPN Inject 0.27 mLs (27 Units total) into the skin at bedtime. PEN 08/16/14   Britt Bottom, NP  levothyroxine (SYNTHROID, LEVOTHROID) 88 MCG tablet Take 1 tablet (88 mcg total) by mouth daily. 08/16/14   Britt Bottom, NP  lisinopril (PRINIVIL,ZESTRIL) 40 MG tablet Take 1 tablet (40 mg total) by mouth daily. 08/16/14   Britt Bottom, NP  loratadine (CLARITIN) 10 MG tablet Take 1 tablet (10 mg total) by mouth daily as needed for allergies. 08/16/14   Britt Bottom, NP  methocarbamol (ROBAXIN) 500 MG tablet Take 500 mg by mouth at bedtime as needed for muscle spasms.     Historical Provider, MD  metoprolol succinate (TOPROL-XL) 50 MG 24 hr tablet Take 1 tablet (50 mg total) by mouth daily. Take with or immediately following a meal. 08/16/14   Britt Bottom, NP  Multiple Vitamin (MULTIVITAMIN) tablet Take 1 tablet by mouth every evening.     Historical Provider, MD  omeprazole (PRILOSEC) 40 MG capsule Take 1 capsule (40 mg total) by mouth daily. 08/16/14   Britt Bottom, NP  oxycodone (ROXICODONE) 30 MG immediate release tablet Take 30 mg by mouth 5 (five) times daily. Take 2 tablets in the morning,1 tablet at lunch and 2 tablets in the afternoon. 07/17/12   Thayer Headings, MD  oxyCODONE (ROXICODONE) 30 MG immediate release tablet Take 1 tablet (30 mg total) by mouth 5 (five) times daily. 12/26/13   Melida Quitter, MD  penicillin v potassium (VEETID) 500 MG tablet Take 500 mg by mouth 4 (four) times daily as needed (for flare up in legs).     Historical Provider, MD  promethazine (PHENERGAN) 25 MG suppository Place 25 mg rectally every 6 (six) hours as needed for nausea or vomiting.    Historical Provider, MD  simvastatin (ZOCOR) 20 MG tablet Take 1 tablet (20 mg total) by  mouth every evening. 08/16/14   Britt Bottom, NP  tiotropium (SPIRIVA) 18 MCG inhalation capsule Place 1 capsule (18 mcg total) into inhaler and inhale daily. 08/16/14   Britt Bottom, NP  warfarin (COUMADIN) 10 MG tablet Take 1 tablet (10 mg total) by mouth See admin instructions. Take 1 tablet (10 mg) on Monday, Wednesday, Friday and Saturday (take 7.5 mg tablet on all other days) Must be brand name 08/16/14   Britt Bottom, NP  warfarin (COUMADIN) 7.5 MG tablet Take 1 tablet (7.5 mg total) by mouth See admin instructions. Take 1 tablet (7.5 mg) on Sunday, Tuesday and Thursday (take 10 mg tablet on all other days) Must be brand name 08/16/14  Britt Bottom, NP   BP 151/95 mmHg  Pulse 96  Temp(Src) 98.4 F (36.9 C) (Oral)  Resp 18  SpO2 98% Physical Exam  Constitutional: He appears well-developed and well-nourished. No distress.  HENT:  Head: Normocephalic and atraumatic.  Eyes: Conjunctivae are normal.  Neck: Neck supple.  Cardiovascular: Normal rate, regular rhythm and intact distal pulses.   Pulmonary/Chest: Effort normal and breath sounds normal. No respiratory distress. He has no wheezes. He has no rales. He exhibits no tenderness.  Abdominal: Soft. There is no tenderness.  Musculoskeletal: He exhibits tenderness.       Legs: Lymphadenopathy:    He has no cervical adenopathy.  Neurological: He is alert.  Skin: Skin is warm and dry. No rash noted. He is not diaphoretic.  Psychiatric: He has a normal mood and affect.  Nursing note and vitals reviewed.   ED Course  Procedures (including critical care time) Labs Review Labs Reviewed - No data to display  Imaging Review No results found.   EKG Interpretation None      MDM   Final diagnoses:  Leg swelling  Medication refill   59 yo presenting with request of med refill and report of unilateral leg swelling.  Discussed case with Dr. Thurnell Garbe.  Case mgmt at bedside to discuss med refills.  Prescriptions  provided for home meds.  Lower extremity doppler negative for DVT, superficial thrombosis, or Baker's cyst. Pt is well-appearing, in no acute distress and vital signs reviewed and not concerning. He appears safe to be discharged.  Discharge include follow-up with their PCP.  Return precautions provided. Pt aware of plan and in agreement.    Filed Vitals:   08/16/14 1121 08/16/14 1130 08/16/14 1145 08/16/14 1234  BP: 151/95 124/77 154/87   Pulse: 96 80 107 85  Temp: 98.4 F (36.9 C)     TempSrc: Oral     Resp: 18   20  SpO2: 98% 97% 98% 97%   Meds given in ED:  Medications - No data to display  Discharge Medication List as of 08/16/2014 12:46 PM         Britt Bottom, NP 08/16/14 [place], DO 08/19/14 [id number]

## 2014-08-20 ENCOUNTER — Ambulatory Visit (INDEPENDENT_AMBULATORY_CARE_PROVIDER_SITE_OTHER): Payer: Medicaid Other | Admitting: *Deleted

## 2014-08-20 ENCOUNTER — Telehealth: Payer: Self-pay | Admitting: *Deleted

## 2014-08-20 DIAGNOSIS — I480 Paroxysmal atrial fibrillation: Secondary | ICD-10-CM

## 2014-08-20 LAB — POCT INR: INR: 2

## 2014-08-20 MED ORDER — COUMADIN 7.5 MG PO TABS: 7.5000 mg | ORAL_TABLET | ORAL | Status: DC

## 2014-08-20 MED ORDER — COUMADIN 10 MG PO TABS: 10.0000 mg | ORAL_TABLET | ORAL | Status: DC

## 2014-08-20 NOTE — Telephone Encounter (Signed)
Called to inform pt of cancelled appointment at [facility]....will call to reschedule.  Message left.

## 2014-08-28 ENCOUNTER — Encounter: Payer: Self-pay | Admitting: Internal Medicine

## 2014-08-28 ENCOUNTER — Ambulatory Visit: Payer: Medicaid Other | Attending: Internal Medicine | Admitting: Internal Medicine

## 2014-08-28 VITALS — BP 163/83 | HR 85 | Temp 98.6°F | Resp 16 | Ht >= 80 in | Wt 305.0 lb

## 2014-08-28 DIAGNOSIS — E119 Type 2 diabetes mellitus without complications: Secondary | ICD-10-CM | POA: Insufficient documentation

## 2014-08-28 DIAGNOSIS — I480 Paroxysmal atrial fibrillation: Secondary | ICD-10-CM | POA: Diagnosis not present

## 2014-08-28 DIAGNOSIS — K219 Gastro-esophageal reflux disease without esophagitis: Secondary | ICD-10-CM

## 2014-08-28 DIAGNOSIS — E079 Disorder of thyroid, unspecified: Secondary | ICD-10-CM | POA: Diagnosis not present

## 2014-08-28 DIAGNOSIS — G8929 Other chronic pain: Secondary | ICD-10-CM | POA: Diagnosis not present

## 2014-08-28 DIAGNOSIS — E785 Hyperlipidemia, unspecified: Secondary | ICD-10-CM | POA: Insufficient documentation

## 2014-08-28 DIAGNOSIS — Z720 Tobacco use: Secondary | ICD-10-CM

## 2014-08-28 DIAGNOSIS — M069 Rheumatoid arthritis, unspecified: Secondary | ICD-10-CM | POA: Diagnosis not present

## 2014-08-28 DIAGNOSIS — F17200 Nicotine dependence, unspecified, uncomplicated: Secondary | ICD-10-CM

## 2014-08-28 DIAGNOSIS — I10 Essential (primary) hypertension: Secondary | ICD-10-CM | POA: Diagnosis not present

## 2014-08-28 DIAGNOSIS — B1920 Unspecified viral hepatitis C without hepatic coma: Secondary | ICD-10-CM

## 2014-08-28 DIAGNOSIS — F17210 Nicotine dependence, cigarettes, uncomplicated: Secondary | ICD-10-CM | POA: Diagnosis not present

## 2014-08-28 DIAGNOSIS — Z8739 Personal history of other diseases of the musculoskeletal system and connective tissue: Secondary | ICD-10-CM

## 2014-08-28 DIAGNOSIS — R52 Pain, unspecified: Secondary | ICD-10-CM | POA: Diagnosis present

## 2014-08-28 LAB — POCT GLYCOSYLATED HEMOGLOBIN (HGB A1C): Hemoglobin A1C: 6.9

## 2014-08-28 LAB — GLUCOSE, POCT (MANUAL RESULT ENTRY): POC GLUCOSE: 211 mg/dL — AB (ref 70–99)

## 2014-08-28 MED ORDER — INSULIN PEN NEEDLE 31G X 8 MM MISC: Status: DC

## 2014-08-28 MED ORDER — LEVOTHYROXINE SODIUM 88 MCG PO TABS: 88.0000 ug | ORAL_TABLET | Freq: Every day | ORAL | Status: DC

## 2014-08-28 MED ORDER — OMEPRAZOLE 40 MG PO CPDR: 40.0000 mg | DELAYED_RELEASE_CAPSULE | Freq: Every day | ORAL | Status: DC

## 2014-08-28 MED ORDER — LISINOPRIL 40 MG PO TABS: 40.0000 mg | ORAL_TABLET | Freq: Every day | ORAL | Status: DC

## 2014-08-28 MED ORDER — SIMVASTATIN 20 MG PO TABS: 20.0000 mg | ORAL_TABLET | Freq: Every day | ORAL | Status: DC

## 2014-08-28 MED ORDER — METOPROLOL TARTRATE 50 MG PO TABS: 50.0000 mg | ORAL_TABLET | Freq: Every day | ORAL | Status: DC

## 2014-08-28 MED ORDER — AMLODIPINE BESYLATE 5 MG PO TABS: 5.0000 mg | ORAL_TABLET | Freq: Every day | ORAL | Status: DC

## 2014-08-28 NOTE — Progress Notes (Signed)
Patient ID: Johnny Sims, male   DOB: February 16, 1956, 59 y.o.   MRN: [id number]  IRJ:[id number]  [id number]  DOB - 1956-05-29  CC:  Chief Complaint  Patient presents with  . Establish Care       HPI: Johnny Sims is a 59 y.o. male here today to establish medical care. He has a extensive past medical history for atrial fibrillation, COPD, HTN, Hep C, HLD, Melanoma and thyroid cancer at age 21. He presents to clinic today with his main concern of chroni generalized pain.  He states that he has a history if rheumatoid, diabetic polyneuropathy, and fibromyalgia and has been on high dose oxycodone for several years. He complains that he does not go to rheumatology for treatment because he is unhappy with several doctors he has seen and because he has had to switch to 4 different providers in less that 6 years.  He is unable to give the name and date of the last rheumatologist.  Upon review of medical records, patient has been referred to rheumatology several times and refuses to go because he wants pain medication.   He c/o of swelling in his LLE and report that he has had 4 negative dopplers studies of that extremity and has been told that he has PVD/venous statis.  He notes that he has been treated by every large medical center in the area. He does have a cardiologist who is managing his coumadin dosing for atrial fibrillation.    Patient has No headache, No chest pain, No abdominal pain - No Nausea, No new weakness tingling or numbness, No Cough - SOB.  Allergies  Allergen Reactions  . Antihistamines, Chlorpheniramine-Type Other (See Comments)    unknown  . Iodinated Diagnostic Agents Other (See Comments)    Patient states he doesn't know what iodine is and doesn't think he is allergic to it despite it being listed with his allergies  . Lidocaine Itching    Patient is uncertain of this allergy  . Methadone Other (See Comments)    "I won't take it anymore"  . Other Hives    Steroid creams  .  Oxycodone Hcl     Makes hyper  . Pentazocine Lactate Other (See Comments)    unknown  . Sulfa Antibiotics Itching and Other (See Comments)    whelps  . Vicodin [Hydrocodone-Acetaminophen] Other (See Comments)    Doesn't want to take   Past Medical History  Diagnosis Date  . PAF (paroxysmal atrial fibrillation)   . Diabetes mellitus   . Hypothyroidism   . Hyperlipidemia   . Hypertension   . Chronic pain   . OA (osteoarthritis)   . COPD (chronic obstructive pulmonary disease)   . RA (rheumatoid arthritis)   . Hepatitis C   . Venous stasis   . Bipolar 1 disorder   . Hepatitis C   . Fibromyalgia   . Dysrhythmia     PAF  . Asthma   . Anxiety   . Melanoma     face, shoulder arm  . Pancreatitis, acute 2015   Current Outpatient Prescriptions on File Prior to Visit  Medication Sig Dispense Refill  . amLODipine (NORVASC) 5 MG tablet Take 1 tablet (5 mg total) by mouth daily. 30 tablet 0  . cholecalciferol (VITAMIN D) 1000 UNITS tablet Take 2,000 Units by mouth every evening.     . clindamycin (CLEOCIN) 300 MG capsule Take 300 mg by mouth 3 (three) times daily.     . clindamycin (CLEOCIN) 300  MG capsule Take 1 capsule (300 mg total) by mouth 3 (three) times daily. 21 capsule 0  . COUMADIN 10 MG tablet Take 1 tablet (10 mg total) by mouth as directed. 30 tablet 1  . COUMADIN 7.5 MG tablet Take 1 tablet (7.5 mg total) by mouth as directed. 30 tablet 1  . fluticasone (FLONASE) 50 MCG/ACT nasal spray Place 1 spray into both nostrils daily as needed for allergies.     Marland Kitchen insulin glargine (LANTUS) 100 unit/mL SOPN Inject 0.27 mLs (27 Units total) into the skin at bedtime. PEN 15 mL 0  . levothyroxine (SYNTHROID, LEVOTHROID) 88 MCG tablet Take 1 tablet (88 mcg total) by mouth daily. 30 tablet 0  . lisinopril (PRINIVIL,ZESTRIL) 40 MG tablet Take 1 tablet (40 mg total) by mouth daily. 30 tablet 0  . loratadine (CLARITIN) 10 MG tablet Take 1 tablet (10 mg total) by mouth daily as needed for  allergies. 30 tablet 0  . methocarbamol (ROBAXIN) 500 MG tablet Take 500 mg by mouth at bedtime as needed for muscle spasms.     . metoprolol succinate (TOPROL-XL) 50 MG 24 hr tablet Take 1 tablet (50 mg total) by mouth daily. Take with or immediately following a meal. 30 tablet 6  . Multiple Vitamin (MULTIVITAMIN) tablet Take 1 tablet by mouth every evening.     Marland Kitchen omeprazole (PRILOSEC) 40 MG capsule Take 1 capsule (40 mg total) by mouth daily. 30 capsule 0  . promethazine (PHENERGAN) 25 MG suppository Place 25 mg rectally every 6 (six) hours as needed for nausea or vomiting.    . simvastatin (ZOCOR) 20 MG tablet Take 1 tablet (20 mg total) by mouth every evening. 30 tablet 0  . tiotropium (SPIRIVA) 18 MCG inhalation capsule Place 1 capsule (18 mcg total) into inhaler and inhale daily. 30 capsule 0  . diazepam (VALIUM) 10 MG tablet Take 10 mg by mouth 4 (four) times daily as needed. For anxiety    . oxycodone (ROXICODONE) 30 MG immediate release tablet Take 30 mg by mouth 5 (five) times daily. Take 2 tablets in the morning,1 tablet at lunch and 2 tablets in the afternoon. 30 tablet 0  . oxyCODONE (ROXICODONE) 30 MG immediate release tablet Take 1 tablet (30 mg total) by mouth 5 (five) times daily. (Patient not taking: Reported on 08/28/2014) 35 tablet 0  . penicillin v potassium (VEETID) 500 MG tablet Take 500 mg by mouth 4 (four) times daily as needed (for flare up in legs).      No current facility-administered medications on file prior to visit.   Family History  Problem Relation Age of Onset  . Hypertension Mother   . Alzheimer's disease Mother   . Heart disease Father    History   Social History  . Marital Status: Single    Spouse Name: N/A  . Number of Children: N/A  . Years of Education: N/A   Occupational History  . Not on file.   Social History Main Topics  . Smoking status: Current Every Day Smoker -- 1.00 packs/day for 43 years  . Smokeless tobacco: Not on file  . Alcohol  Use: 0.6 oz/week    1 Cans of beer per week  . Drug Use: No  . Sexual Activity: Not on file   Other Topics Concern  . Not on file   Social History Narrative    Review of Systems  Cardiovascular: Positive for claudication and leg swelling.  Musculoskeletal: Positive for myalgias and joint pain.  Neurological: Positive for tingling (BLE).  All other systems reviewed and are negative.     Objective:   Filed Vitals:   08/28/14 0912  BP: 163/83  Pulse: 85  Temp: 98.6 F (37 C)  Resp: 16    Physical Exam  Constitutional: He is oriented to person, place, and time.  Cardiovascular: Normal rate, regular rhythm and normal heart sounds.   Pulmonary/Chest: Effort normal.  Abdominal: Soft. Bowel sounds are normal. There is no tenderness.  Musculoskeletal: He exhibits edema (BLE with poor circulation as evidence by venous stasis ulcers).  Neurological: He is alert and oriented to person, place, and time.  Skin: Skin is warm and dry. There is erythema.  Psychiatric:  Patient is very loud and spoke over me the whole history and exam      Lab Results  Component Value Date   WBC 9.0 07/23/2014   HGB 13.2 07/23/2014   HCT 39.2 07/23/2014   MCV 83.1 07/23/2014   PLT 197 07/23/2014   Lab Results  Component Value Date   CREATININE 1.52* 07/23/2014   BUN 23 07/23/2014   NA 138 07/23/2014   K 4.1 07/23/2014   CL 108 07/23/2014   CO2 24 07/23/2014    Lab Results  Component Value Date   HGBA1C 13.9* 07/23/2012   Lipid Panel     Component Value Date/Time   CHOL  02/19/2010 0418    114        ATP III CLASSIFICATION:  <200     mg/dL   Desirable  200-239  mg/dL   Borderline High  >=240    mg/dL   High          TRIG 266* 02/19/2010 0418   HDL 28* 02/19/2010 0418   CHOLHDL 4.1 02/19/2010 0418   VLDL 53* 02/19/2010 0418   LDLCALC  02/19/2010 0418    33        Total Cholesterol/HDL:CHD Risk Coronary Heart Disease Risk Table                     Men   Women  1/2  Average Risk   3.4   3.3  Average Risk       5.0   4.4  2 X Average Risk   9.6   7.1  3 X Average Risk  23.4   11.0        Use the calculated Patient Ratio above and the CHD Risk Table to determine the patient's CHD Risk.        ATP III CLASSIFICATION (LDL):  <100     mg/dL   Optimal  100-129  mg/dL   Near or Above                    Optimal  130-159  mg/dL   Borderline  160-189  mg/dL   High  >190     mg/dL   Very High       Assessment and plan:   Navi was seen today for establish care.  Diagnoses and all orders for this visit:  Type 2 diabetes mellitus without complication Orders: -     Glucose (CBG) -     Cancel: Hemoglobin A1c -     HgB A1c -    Refill Insulin Pen Needle (SURE COMFORT PEN NEEDLES) 31G X 8 MM MISC; Use insulin once nightly for E11.9 Patients diabetes is well control as evidence by low a1c.  Patient will continue with current  therapy and continue to make necessary lifestyle changes.  Reviewed foot care, diet, exercise, annual health maintenance with patient.   Paroxysmal atrial fibrillation Orders: -     Refill metoprolol (LOPRESSOR) 50 MG tablet; Take 1 tablet (50 mg total) by mouth daily. Patient will continue f/u with Cardiology for Coumadin dosing.  Essential hypertension Orders: -    Refill lisinopril (PRINIVIL,ZESTRIL) 40 MG tablet; Take 1 tablet (40 mg total) by mouth daily. -    Refill amLODipine (NORVASC) 5 MG tablet; Take 1 tablet (5 mg total) by mouth daily. I believe the patients pressure is elevated due to him being upset about pain management  HLD (hyperlipidemia) Orders: -     Refill simvastatin (ZOCOR) 20 MG tablet; Take 1 tablet (20 mg total) by mouth daily. Education provided on proper lifestyle changes in order to lower cholesterol. Patient advised to maintain healthy weight and to keep total fat intake at 25-35% of total calories and carbohydrates 50-60% of total daily calories. Explained how high cholesterol places patient at risk  for heart disease. Patient placed on appropriate medication and repeat labs in 6 months   Hepatitis C virus infection without hepatic coma, unspecified chronicity Explained to patient that I can refer him to Infectious Disease to see if he is a candidate for treatment.  History of rheumatoid arthritis Orders: -     Ambulatory referral to Rheumatology -     Ambulatory referral to Pain Clinic---Explained to patient that staying on chronic pain management is just covering up his pain but it is not fixing the source of the pain. I advised him to seek proper care for his arthritis so that he may have proper treatment which will later lead to pain control. He states that he needs pain medication because his body has been used to it for several years.   Thyroid disorder Orders: -    Refill levothyroxine (SYNTHROID, LEVOTHROID) 88 MCG tablet; Take 1 tablet (88 mcg total) by mouth daily. Advised patient to come back within one week for lab testing   Gastroesophageal reflux disease, esophagitis presence not specified Orders: -     Refill omeprazole (PRILOSEC) 40 MG capsule; Take 1 capsule (40 mg total) by mouth daily.  Tobacco use disorder Smoking cessation discussed for 3 minutes, patient is not willing to quit at this time. Will continue to assess on each visit. Discussed increased risk for diseases such as cancer, heart disease, and stroke.  Explained that smoking with worsening circulation of his BLE and can eventually lead to amputation.  >50% of this visit was spent reviewing outside medical records and counseling patient.   Return in about 3 months (around 11/28/2014) for with Jegede .  Patient is very intimidating and aggressive.  He did not allow me to take a proper history due to speaking over me and not fully answering my questions. It is in my opinion the patient is search for narcotics and I explained to him that he will not get narcotic pain medication from this clinic. It is in my  opinion the patient needs to be seen by a male provider due to his behavior.    Chari Manning, NP-C [facility] [phone] 08/28/2014, 9:30 AM

## 2014-08-28 NOTE — Progress Notes (Signed)
Pt is here to establish care. Pt has a history of cellulitis, HTN, diabetes, cancer, hep C, A fib and bipolar disorder. Pt states that he has severe widespread pain.

## 2014-08-28 NOTE — Addendum Note (Signed)
Addended by: Chari Manning A on: 08/28/2014 11:03 PM   Modules accepted: Orders

## 2014-08-28 NOTE — Patient Instructions (Addendum)
Smoking Cessation Quitting smoking is important to your health and has many advantages. However, it is not always easy to quit since nicotine is a very addictive drug. Oftentimes, people try 3 times or more before being able to quit. This document explains the best ways for you to prepare to quit smoking. Quitting takes hard work and a lot of effort, but you can do it. ADVANTAGES OF QUITTING SMOKING  You will live longer, feel better, and live better.  Your body will feel the impact of quitting smoking almost immediately.  Within 20 minutes, blood pressure decreases. Your pulse returns to its normal level.  After 8 hours, carbon monoxide levels in the blood return to normal. Your oxygen level increases.  After 24 hours, the chance of having a heart attack starts to decrease. Your breath, hair, and body stop smelling like smoke.  After 48 hours, damaged nerve endings begin to recover. Your sense of taste and smell improve.  After 72 hours, the body is virtually free of nicotine. Your bronchial tubes relax and breathing becomes easier.  After 2 to 12 weeks, lungs can hold more air. Exercise becomes easier and circulation improves.  The risk of having a heart attack, stroke, cancer, or lung disease is greatly reduced.  After 1 year, the risk of coronary heart disease is cut in half.  After 5 years, the risk of stroke falls to the same as a nonsmoker.  After 10 years, the risk of lung cancer is cut in half and the risk of other cancers decreases significantly.  After 15 years, the risk of coronary heart disease drops, usually to the level of a nonsmoker.  If you are pregnant, quitting smoking will improve your chances of having a healthy baby.  The people you live with, especially any children, will be healthier.  You will have extra money to spend on things other than cigarettes. QUESTIONS TO THINK ABOUT BEFORE ATTEMPTING TO QUIT You may want to talk about your answers with your  health care provider.  Why do you want to quit?  If you tried to quit in the past, what helped and what did not?  What will be the most difficult situations for you after you quit? How will you plan to handle them?  Who can help you through the tough times? Your family? Friends? A health care provider?  What pleasures do you get from smoking? What ways can you still get pleasure if you quit? Here are some questions to ask your health care provider:  How can you help me to be successful at quitting?  What medicine do you think would be best for me and how should I take it?  What should I do if I need more help?  What is smoking withdrawal like? How can I get information on withdrawal? GET READY  Set a quit date.  Change your environment by getting rid of all cigarettes, ashtrays, matches, and lighters in your home, car, or work. Do not let people smoke in your home.  Review your past attempts to quit. Think about what worked and what did not. GET SUPPORT AND ENCOURAGEMENT You have a better chance of being successful if you have help. You can get support in many ways.  Tell your family, friends, and coworkers that you are going to quit and need their support. Ask them not to smoke around you.  Get individual, group, or telephone counseling and support. Programs are available at local hospitals and health centers. Call   your local health department for information about programs in your area.  Spiritual beliefs and practices may help some smokers quit.  Download a "quit meter" on your computer to keep track of quit statistics, such as how long you have gone without smoking, cigarettes not smoked, and money saved.  Get a self-help book about quitting smoking and staying off tobacco. LEARN NEW SKILLS AND BEHAVIORS  Distract yourself from urges to smoke. Talk to someone, go for a walk, or occupy your time with a task.  Change your normal routine. Take a different route to work.  Drink tea instead of coffee. Eat breakfast in a different place.  Reduce your stress. Take a hot bath, exercise, or read a book.  Plan something enjoyable to do every day. Reward yourself for not smoking.  Explore interactive web-based programs that specialize in helping you quit. GET MEDICINE AND USE IT CORRECTLY Medicines can help you stop smoking and decrease the urge to smoke. Combining medicine with the above behavioral methods and support can greatly increase your chances of successfully quitting smoking.  Nicotine replacement therapy helps deliver nicotine to your body without the negative effects and risks of smoking. Nicotine replacement therapy includes nicotine gum, lozenges, inhalers, nasal sprays, and skin patches. Some may be available over-the-counter and others require a prescription.  Antidepressant medicine helps people abstain from smoking, but how this works is unknown. This medicine is available by prescription.  Nicotinic receptor partial agonist medicine simulates the effect of nicotine in your brain. This medicine is available by prescription. Ask your health care provider for advice about which medicines to use and how to use them based on your health history. Your health care provider will tell you what side effects to look out for if you choose to be on a medicine or therapy. Carefully read the information on the package. Do not use any other product containing nicotine while using a nicotine replacement product.  RELAPSE OR DIFFICULT SITUATIONS Most relapses occur within the first 3 months after quitting. Do not be discouraged if you start smoking again. Remember, most people try several times before finally quitting. You may have symptoms of withdrawal because your body is used to nicotine. You may crave cigarettes, be irritable, feel very hungry, cough often, get headaches, or have difficulty concentrating. The withdrawal symptoms are only temporary. They are strongest  when you first quit, but they will go away within 10-14 days. To reduce the chances of relapse, try to:  Avoid drinking alcohol. Drinking lowers your chances of successfully quitting.  Reduce the amount of caffeine you consume. Once you quit smoking, the amount of caffeine in your body increases and can give you symptoms, such as a rapid heartbeat, sweating, and anxiety.  Avoid smokers because they can make you want to smoke.  Do not let weight gain distract you. Many smokers will gain weight when they quit, usually less than 10 pounds. Eat a healthy diet and stay active. You can always lose the weight gained after you quit.  Find ways to improve your mood other than smoking. FOR MORE INFORMATION  www.smokefree.gov  Document Released: 06/07/2001 Document Revised: 10/28/2013 Document Reviewed: 09/22/2011 Exit[employer] Patient Information 2015 [employer]. This information is not intended to replace advice given to you by your health care provider. Make sure you discuss any questions you have with your health care provider.  

## 2014-09-08 ENCOUNTER — Telehealth: Payer: Self-pay | Admitting: *Deleted

## 2014-09-08 NOTE — Telephone Encounter (Signed)
Called patient and left a voice mail; he needs to schedule a lab appointment prior to seeing Dr. Linus Salmons for Hep C referral. I have the sheet with the labs that are needed. Myrtis Hopping

## 2014-09-10 ENCOUNTER — Telehealth: Payer: Self-pay | Admitting: General Practice

## 2014-09-10 ENCOUNTER — Ambulatory Visit (INDEPENDENT_AMBULATORY_CARE_PROVIDER_SITE_OTHER): Payer: Medicaid Other | Admitting: *Deleted

## 2014-09-10 DIAGNOSIS — I480 Paroxysmal atrial fibrillation: Secondary | ICD-10-CM

## 2014-09-10 LAB — POCT INR: INR: 1.9

## 2014-09-10 NOTE — Telephone Encounter (Signed)
Patient presents to clinic to follow up on his request for ENT referral. Patient was in for NP appointment on 08/28/14. Patient states that at the time of this visit he spoke to PCP about referral. Patient states he has an appointment with ENT on 10/03/14 at [facility] (Dr. Redmond Baseman). Patient also has [facility]. Patient is also mentioning Dermatology referral.  Please follow up with patient to assist and discuss.

## 2014-09-10 NOTE — Telephone Encounter (Signed)
**  Please see previous message**  Patient just presents back to facility, requesting orthopedic referral. Patient states he see's Dr. Maretta Los. Thanks

## 2014-09-15 ENCOUNTER — Telehealth: Payer: Self-pay | Admitting: *Deleted

## 2014-09-15 NOTE — Telephone Encounter (Signed)
Multiple attempts to contact patient regarding Hep C referral from provider Chari Manning. Voice mail was left and patient did not return the call. Tried again today and his phone is not accepting calls. Will forward this note to provider. Patient needs labs prior to being scheduled with Dr. Linus Salmons. Myrtis Hopping

## 2014-09-23 ENCOUNTER — Encounter (HOSPITAL_COMMUNITY): Payer: Self-pay | Admitting: Emergency Medicine

## 2014-09-23 ENCOUNTER — Emergency Department (HOSPITAL_COMMUNITY): Payer: Medicaid Other

## 2014-09-23 ENCOUNTER — Inpatient Hospital Stay (HOSPITAL_COMMUNITY)
Admission: EM | Admit: 2014-09-23 | Discharge: 2014-09-24 | DRG: 540 | Discharge status: Against Medical Advice | Payer: Medicaid Other | Attending: Internal Medicine | Admitting: Internal Medicine

## 2014-09-23 ENCOUNTER — Other Ambulatory Visit: Payer: Medicaid Other

## 2014-09-23 DIAGNOSIS — Z882 Allergy status to sulfonamides status: Secondary | ICD-10-CM | POA: Diagnosis not present

## 2014-09-23 DIAGNOSIS — E039 Hypothyroidism, unspecified: Secondary | ICD-10-CM | POA: Diagnosis present

## 2014-09-23 DIAGNOSIS — F17200 Nicotine dependence, unspecified, uncomplicated: Secondary | ICD-10-CM | POA: Diagnosis present

## 2014-09-23 DIAGNOSIS — I10 Essential (primary) hypertension: Secondary | ICD-10-CM | POA: Diagnosis present

## 2014-09-23 DIAGNOSIS — I4891 Unspecified atrial fibrillation: Secondary | ICD-10-CM | POA: Diagnosis present

## 2014-09-23 DIAGNOSIS — M1990 Unspecified osteoarthritis, unspecified site: Secondary | ICD-10-CM | POA: Diagnosis present

## 2014-09-23 DIAGNOSIS — L97529 Non-pressure chronic ulcer of other part of left foot with unspecified severity: Secondary | ICD-10-CM | POA: Diagnosis present

## 2014-09-23 DIAGNOSIS — Z720 Tobacco use: Secondary | ICD-10-CM

## 2014-09-23 DIAGNOSIS — Z8249 Family history of ischemic heart disease and other diseases of the circulatory system: Secondary | ICD-10-CM | POA: Diagnosis not present

## 2014-09-23 DIAGNOSIS — E119 Type 2 diabetes mellitus without complications: Secondary | ICD-10-CM | POA: Diagnosis not present

## 2014-09-23 DIAGNOSIS — Z85820 Personal history of malignant melanoma of skin: Secondary | ICD-10-CM

## 2014-09-23 DIAGNOSIS — N179 Acute kidney failure, unspecified: Secondary | ICD-10-CM | POA: Diagnosis present

## 2014-09-23 DIAGNOSIS — Z794 Long term (current) use of insulin: Secondary | ICD-10-CM | POA: Diagnosis not present

## 2014-09-23 DIAGNOSIS — Z885 Allergy status to narcotic agent status: Secondary | ICD-10-CM | POA: Diagnosis not present

## 2014-09-23 DIAGNOSIS — B1920 Unspecified viral hepatitis C without hepatic coma: Secondary | ICD-10-CM | POA: Diagnosis present

## 2014-09-23 DIAGNOSIS — Z79891 Long term (current) use of opiate analgesic: Secondary | ICD-10-CM

## 2014-09-23 DIAGNOSIS — I480 Paroxysmal atrial fibrillation: Secondary | ICD-10-CM | POA: Diagnosis present

## 2014-09-23 DIAGNOSIS — M797 Fibromyalgia: Secondary | ICD-10-CM | POA: Diagnosis present

## 2014-09-23 DIAGNOSIS — L84 Corns and callosities: Secondary | ICD-10-CM | POA: Diagnosis present

## 2014-09-23 DIAGNOSIS — E1140 Type 2 diabetes mellitus with diabetic neuropathy, unspecified: Secondary | ICD-10-CM | POA: Diagnosis present

## 2014-09-23 DIAGNOSIS — J449 Chronic obstructive pulmonary disease, unspecified: Secondary | ICD-10-CM | POA: Diagnosis present

## 2014-09-23 DIAGNOSIS — Z820 Family history of epilepsy and other diseases of the nervous system: Secondary | ICD-10-CM

## 2014-09-23 DIAGNOSIS — B182 Chronic viral hepatitis C: Secondary | ICD-10-CM

## 2014-09-23 DIAGNOSIS — M869 Osteomyelitis, unspecified: Secondary | ICD-10-CM | POA: Diagnosis present

## 2014-09-23 DIAGNOSIS — Z79899 Other long term (current) drug therapy: Secondary | ICD-10-CM

## 2014-09-23 DIAGNOSIS — M86172 Other acute osteomyelitis, left ankle and foot: Secondary | ICD-10-CM | POA: Diagnosis present

## 2014-09-23 DIAGNOSIS — M069 Rheumatoid arthritis, unspecified: Secondary | ICD-10-CM | POA: Diagnosis present

## 2014-09-23 DIAGNOSIS — I878 Other specified disorders of veins: Secondary | ICD-10-CM | POA: Diagnosis present

## 2014-09-23 DIAGNOSIS — Z7901 Long term (current) use of anticoagulants: Secondary | ICD-10-CM | POA: Diagnosis not present

## 2014-09-23 DIAGNOSIS — Z792 Long term (current) use of antibiotics: Secondary | ICD-10-CM

## 2014-09-23 DIAGNOSIS — Z888 Allergy status to other drugs, medicaments and biological substances status: Secondary | ICD-10-CM

## 2014-09-23 DIAGNOSIS — M79675 Pain in left toe(s): Secondary | ICD-10-CM | POA: Diagnosis not present

## 2014-09-23 DIAGNOSIS — F17210 Nicotine dependence, cigarettes, uncomplicated: Secondary | ICD-10-CM | POA: Diagnosis present

## 2014-09-23 DIAGNOSIS — Z91041 Radiographic dye allergy status: Secondary | ICD-10-CM

## 2014-09-23 DIAGNOSIS — J45909 Unspecified asthma, uncomplicated: Secondary | ICD-10-CM | POA: Diagnosis present

## 2014-09-23 DIAGNOSIS — M86179 Other acute osteomyelitis, unspecified ankle and foot: Secondary | ICD-10-CM | POA: Diagnosis present

## 2014-09-23 DIAGNOSIS — F319 Bipolar disorder, unspecified: Secondary | ICD-10-CM | POA: Diagnosis present

## 2014-09-23 DIAGNOSIS — J441 Chronic obstructive pulmonary disease with (acute) exacerbation: Secondary | ICD-10-CM | POA: Diagnosis present

## 2014-09-23 DIAGNOSIS — E785 Hyperlipidemia, unspecified: Secondary | ICD-10-CM | POA: Diagnosis present

## 2014-09-23 LAB — BASIC METABOLIC PANEL
ANION GAP: 3 — AB (ref 5–15)
BUN: 16 mg/dL (ref 6–23)
CO2: 29 mmol/L (ref 19–32)
Calcium: 8 mg/dL — ABNORMAL LOW (ref 8.4–10.5)
Chloride: 108 mmol/L (ref 96–112)
Creatinine, Ser: 2.15 mg/dL — ABNORMAL HIGH (ref 0.50–1.35)
GFR calc Af Amer: 37 mL/min — ABNORMAL LOW (ref >90)
GFR calc non Af Amer: 32 mL/min — ABNORMAL LOW (ref >90)
Glucose, Bld: 131 mg/dL — ABNORMAL HIGH (ref 70–99)
POTASSIUM: 3.8 mmol/L (ref 3.5–5.1)
Sodium: 140 mmol/L (ref 135–145)

## 2014-09-23 LAB — CBC WITH DIFFERENTIAL/PLATELET
BASOS ABS: 0 10*3/uL (ref 0.0–0.1)
Basophils Relative: 0 % (ref 0–1)
EOS PCT: 3 % (ref 0–5)
Eosinophils Absolute: 0.3 10*3/uL (ref 0.0–0.7)
HEMATOCRIT: 39.5 % (ref 39.0–52.0)
Hemoglobin: 14 g/dL (ref 13.0–17.0)
LYMPHS ABS: 3.2 10*3/uL (ref 0.7–4.0)
LYMPHS PCT: 30 % (ref 12–46)
MCH: 29.4 pg (ref 26.0–34.0)
MCHC: 35.4 g/dL (ref 30.0–36.0)
MCV: 82.8 fL (ref 78.0–100.0)
Monocytes Absolute: 0.8 10*3/uL (ref 0.1–1.0)
Monocytes Relative: 8 % (ref 3–12)
NEUTROS ABS: 6.4 10*3/uL (ref 1.7–7.7)
Neutrophils Relative %: 59 % (ref 43–77)
Platelets: 219 10*3/uL (ref 150–400)
RBC: 4.77 MIL/uL (ref 4.22–5.81)
RDW: 14.9 % (ref 11.5–15.5)
WBC: 10.8 10*3/uL — AB (ref 4.0–10.5)

## 2014-09-23 LAB — PROTIME-INR
INR: 1.99 — AB (ref 0.00–1.49)
PROTHROMBIN TIME: 22.8 s — AB (ref 11.6–15.2)

## 2014-09-23 LAB — GLUCOSE, CAPILLARY: GLUCOSE-CAPILLARY: 133 mg/dL — AB (ref 70–99)

## 2014-09-23 LAB — IRON: Iron: 42 ug/dL (ref 42–165)

## 2014-09-23 LAB — APTT: APTT: 37 s (ref 24–37)

## 2014-09-23 MED ORDER — FLUTICASONE PROPIONATE 50 MCG/ACT NA SUSP
1.0000 | Freq: Every day | NASAL | Status: DC | PRN
  Filled 2014-09-23: qty 16

## 2014-09-23 MED ORDER — PANTOPRAZOLE SODIUM 40 MG PO TBEC: 40.0000 mg | DELAYED_RELEASE_TABLET | Freq: Every day | ORAL | Status: DC

## 2014-09-23 MED ORDER — NICOTINE 21 MG/24HR TD PT24
21.0000 mg | MEDICATED_PATCH | Freq: Every day | TRANSDERMAL | Status: DC
  Filled 2014-09-23: qty 1

## 2014-09-23 MED ORDER — LISINOPRIL 40 MG PO TABS: 40.0000 mg | ORAL_TABLET | Freq: Every day | ORAL | Status: DC

## 2014-09-23 MED ORDER — PIPERACILLIN-TAZOBACTAM 3.375 G IVPB
3.3750 g | Freq: Three times a day (TID) | INTRAVENOUS | Status: DC
  Administered 2014-09-24: 3.375 g via INTRAVENOUS
  Filled 2014-09-23 (×3): qty 50

## 2014-09-23 MED ORDER — VANCOMYCIN HCL IN DEXTROSE 1-5 GM/200ML-% IV SOLN
1000.0000 mg | Freq: Once | INTRAVENOUS | Status: AC
  Administered 2014-09-23: 1000 mg via INTRAVENOUS
  Filled 2014-09-23: qty 200

## 2014-09-23 MED ORDER — ACETAMINOPHEN 325 MG PO TABS: 650.0000 mg | ORAL_TABLET | Freq: Four times a day (QID) | ORAL | Status: DC | PRN

## 2014-09-23 MED ORDER — FENTANYL CITRATE 0.05 MG/ML IJ SOLN: 25.0000 ug | Freq: Once | INTRAMUSCULAR | Status: DC

## 2014-09-23 MED ORDER — PROMETHAZINE HCL 25 MG RE SUPP: 25.0000 mg | Freq: Four times a day (QID) | RECTAL | Status: DC | PRN

## 2014-09-23 MED ORDER — INSULIN ASPART 100 UNIT/ML ~~LOC~~ SOLN: 0.0000 [IU] | Freq: Three times a day (TID) | SUBCUTANEOUS | Status: DC

## 2014-09-23 MED ORDER — LORATADINE 10 MG PO TABS
10.0000 mg | ORAL_TABLET | Freq: Every day | ORAL | Status: DC | PRN
  Filled 2014-09-23: qty 1

## 2014-09-23 MED ORDER — VANCOMYCIN HCL IN DEXTROSE 1-5 GM/200ML-% IV SOLN
1000.0000 mg | INTRAVENOUS | Status: AC
  Administered 2014-09-23: 1000 mg via INTRAVENOUS
  Filled 2014-09-23: qty 200

## 2014-09-23 MED ORDER — SODIUM CHLORIDE 0.9 % IV BOLUS (SEPSIS)
1000.0000 mL | Freq: Once | INTRAVENOUS | Status: AC
  Administered 2014-09-23: 1000 mL via INTRAVENOUS

## 2014-09-23 MED ORDER — HYDRALAZINE HCL 20 MG/ML IJ SOLN: 5.0000 mg | INTRAMUSCULAR | Status: DC | PRN

## 2014-09-23 MED ORDER — PIPERACILLIN-TAZOBACTAM 3.375 G IVPB
3.3750 g | Freq: Once | INTRAVENOUS | Status: AC
  Administered 2014-09-23: 3.375 g via INTRAVENOUS
  Filled 2014-09-23: qty 50

## 2014-09-23 MED ORDER — SIMVASTATIN 20 MG PO TABS
20.0000 mg | ORAL_TABLET | Freq: Every day | ORAL | Status: DC
  Filled 2014-09-23: qty 1

## 2014-09-23 MED ORDER — METOPROLOL TARTRATE 50 MG PO TABS: 50.0000 mg | ORAL_TABLET | Freq: Every day | ORAL | Status: DC

## 2014-09-23 MED ORDER — METHOCARBAMOL 500 MG PO TABS
500.0000 mg | ORAL_TABLET | Freq: Every evening | ORAL | Status: DC | PRN
  Filled 2014-09-23: qty 1

## 2014-09-23 MED ORDER — VANCOMYCIN HCL IN DEXTROSE 1-5 GM/200ML-% IV SOLN: 1000.0000 mg | Freq: Two times a day (BID) | INTRAVENOUS | Status: DC

## 2014-09-23 MED ORDER — LEVOTHYROXINE SODIUM 88 MCG PO TABS
88.0000 ug | ORAL_TABLET | Freq: Every day | ORAL | Status: DC
Start: 2014-09-24 — End: 2014-09-24
  Administered 2014-09-24: 88 ug via ORAL
  Filled 2014-09-23 (×2): qty 1

## 2014-09-23 MED ORDER — ADULT MULTIVITAMIN W/MINERALS CH
1.0000 | ORAL_TABLET | Freq: Every evening | ORAL | Status: DC
  Filled 2014-09-23: qty 1

## 2014-09-23 MED ORDER — SODIUM CHLORIDE 0.9 % IJ SOLN: 3.0000 mL | Freq: Two times a day (BID) | INTRAMUSCULAR | Status: DC

## 2014-09-23 MED ORDER — FENTANYL CITRATE 0.05 MG/ML IJ SOLN
12.5000 ug | Freq: Once | INTRAMUSCULAR | Status: AC
  Administered 2014-09-23: 12.5 ug via INTRAVENOUS
  Filled 2014-09-23: qty 2

## 2014-09-23 MED ORDER — OXYCODONE-ACETAMINOPHEN 5-325 MG PO TABS
1.0000 | ORAL_TABLET | ORAL | Status: DC | PRN
  Administered 2014-09-24 (×2): 1 via ORAL
  Filled 2014-09-23 (×2): qty 1

## 2014-09-23 MED ORDER — ACETAMINOPHEN 650 MG RE SUPP: 650.0000 mg | Freq: Four times a day (QID) | RECTAL | Status: DC | PRN

## 2014-09-23 MED ORDER — VITAMIN D3 25 MCG (1000 UNIT) PO TABS
2000.0000 [IU] | ORAL_TABLET | Freq: Every evening | ORAL | Status: DC
Start: 2014-09-24 — End: 2014-09-24
  Filled 2014-09-23: qty 2

## 2014-09-23 MED ORDER — METOPROLOL SUCCINATE ER 50 MG PO TB24
50.0000 mg | ORAL_TABLET | Freq: Every day | ORAL | Status: DC
  Filled 2014-09-23: qty 1

## 2014-09-23 MED ORDER — MORPHINE SULFATE 2 MG/ML IJ SOLN
2.0000 mg | INTRAMUSCULAR | Status: DC | PRN
  Administered 2014-09-23 – 2014-09-24 (×2): 2 mg via INTRAVENOUS
  Filled 2014-09-23 (×2): qty 1

## 2014-09-23 MED ORDER — GABAPENTIN 100 MG PO CAPS
100.0000 mg | ORAL_CAPSULE | Freq: Once | ORAL | Status: AC
  Administered 2014-09-23: 100 mg via ORAL
  Filled 2014-09-23: qty 1

## 2014-09-23 MED ORDER — SODIUM CHLORIDE 0.9 % IV SOLN
INTRAVENOUS | Status: DC
  Administered 2014-09-23: 23:00:00 via INTRAVENOUS

## 2014-09-23 MED ORDER — INSULIN GLARGINE 100 UNIT/ML ~~LOC~~ SOLN
20.0000 [IU] | Freq: Every day | SUBCUTANEOUS | Status: DC
  Administered 2014-09-23: 20 [IU] via SUBCUTANEOUS
  Filled 2014-09-23 (×2): qty 0.2

## 2014-09-23 MED ORDER — DIAZEPAM 5 MG PO TABS
10.0000 mg | ORAL_TABLET | Freq: Four times a day (QID) | ORAL | Status: DC | PRN
  Administered 2014-09-24: 10 mg via ORAL
  Filled 2014-09-23: qty 2

## 2014-09-23 MED ORDER — TIOTROPIUM BROMIDE MONOHYDRATE 18 MCG IN CAPS
18.0000 ug | ORAL_CAPSULE | Freq: Every day | RESPIRATORY_TRACT | Status: DC
  Filled 2014-09-23: qty 5

## 2014-09-23 MED ORDER — AMLODIPINE BESYLATE 5 MG PO TABS
5.0000 mg | ORAL_TABLET | Freq: Every day | ORAL | Status: DC
  Filled 2014-09-23: qty 1

## 2014-09-23 MED ORDER — VANCOMYCIN HCL 10 G IV SOLR
1500.0000 mg | INTRAVENOUS | Status: DC
  Filled 2014-09-23: qty 1500

## 2014-09-23 NOTE — Progress Notes (Signed)
Pt to rm 22 a/o x4, via stretcher, no c/o pain, tele to initiate.

## 2014-09-23 NOTE — ED Notes (Signed)
Pt c/o left leg pain and swelling x weeks; pt sts seen here for same in past; non pitting edema noted to bilateral LE; pt sts swelling in second toe on left foot

## 2014-09-23 NOTE — ED Notes (Signed)
Admitting at [facility]

## 2014-09-23 NOTE — Progress Notes (Signed)
ANTIBIOTIC/ANTICOAGULATION CONSULT NOTE - INITIAL  Pharmacy Consult for Vancomycin and Heparin Indication: osteomyelitis and afib  Allergies  Allergen Reactions  . Antihistamines, Chlorpheniramine-Type Other (See Comments)    unknown  . Iodinated Diagnostic Agents Other (See Comments)    Patient states he doesn't know what iodine is and doesn't think he is allergic to it despite it being listed with his allergies  . Lidocaine Itching    Patient is uncertain of this allergy  . Methadone Other (See Comments)    "I won't take it anymore"  . Other Hives    Steroid creams  . Oxycodone Hcl     Makes hyper  . Pentazocine Lactate Other (See Comments)    unknown  . Sulfa Antibiotics Itching and Other (See Comments)    whelps  . Vicodin [Hydrocodone-Acetaminophen] Other (See Comments)    Doesn't want to take    Patient Measurements:   Ht: 81 in   Wt: 138 kg  IBW: 98 kg Heparin dosing wt: 126 kg  Vital Signs: Temp: 98.3 F (36.8 C) (03/29 1548) Temp Source: Oral (03/29 1548) BP: 159/103 mmHg (03/29 2130) Pulse Rate: 88 (03/29 2130) Intake/Output from previous day:   Intake/Output from this shift:    Labs:  CrCl cannot be calculated (Unknown ideal weight.). No results for input(s): VANCOTROUGH, VANCOPEAK, VANCORANDOM, GENTTROUGH, GENTPEAK, GENTRANDOM, TOBRATROUGH, TOBRAPEAK, TOBRARND, AMIKACINPEAK, AMIKACINTROU, AMIKACIN in the last 72 hours.   Microbiology: No results found for this or any previous visit (from the past 720 hour(s)).   Recent Labs  09/23/14 1852  HGB 14.0  HCT 39.5  PLT 219  CREATININE 2.15*    CrCl cannot be calculated (Unknown ideal weight.).   Medical History: Past Medical History  Diagnosis Date  . PAF (paroxysmal atrial fibrillation)   . Diabetes mellitus   . Hypothyroidism   . Hyperlipidemia   . Hypertension   . Chronic pain   . OA (osteoarthritis)   . COPD (chronic obstructive pulmonary disease)   . RA (rheumatoid arthritis)   .  Hepatitis C   . Venous stasis   . Bipolar 1 disorder   . Hepatitis C   . Fibromyalgia   . Dysrhythmia     PAF  . Asthma   . Anxiety   . Melanoma     face, shoulder arm  . Pancreatitis, acute 2015    Medications:  Await electronic med rec  Assessment: 59 y.o. male presents with left leg pain and swelling. SCr 2.15, normalized CrCl ~ 38 ml/min.  AC: Pt on coumadin PTA for afib. CHADS2 score=3. Plan to hold coumadin and bridge with heparin as pt may need surgery. Baseline INR pending.  Dopplers negative for DVT.  ID: Vancomycin 1gm and Zosyn 3.375gm given in ED ~2100. To continue broad spectrum antibiotics for osteomyelitis (Xray suggests osteomyelitis of distal phalanx of the second toe). WBC slightly elevated. Afeb.  Goal of Therapy:  Heparin level 0.3-0.7 units/ml Vancomycin trough 15-20 mcg/ml Monitor platelets by anticoagulation protocol: Yes   Plan:  Vancomycin 1000mg  in addition to 1000mg  that was already given to load with total of 2000mg  Vancomycin 1500mg  IV 24h starting tomorrow Will f/u baseline INR and start heparin if INR < 2  Sherlon Handing, PharmD, BCPS Clinical pharmacist, pager [phone] 09/23/2014,10:21 PM

## 2014-09-23 NOTE — ED Provider Notes (Signed)
CSN: [id number]     Arrival date & time 09/23/14  1526 History   First MD Initiated Contact with Patient 09/23/14 1825     Chief Complaint  Patient presents with  . Leg Pain  . Leg Swelling     (Consider location/radiation/quality/duration/timing/severity/associated sxs/prior Treatment) Patient is a 59 y.o. male presenting with leg pain. The history is provided by the patient. No language interpreter was used.  Leg Pain [name] is a 59 y.o white male who has a history of DM, hyperlipidemia, htn, COPD, RA, and Hep C who presents with gradual onset left foot swelling and pain for the past week.  He states he hit his foot on something recently and his 2nd toe is now swollen and tender. Nothing makes it better or worse.  Pain is 10/10.  He denies any fever, shortness of breath, chest pain, abdominal pain, or leg swelling or pain.  Past Medical History  Diagnosis Date  . PAF (paroxysmal atrial fibrillation)   . Diabetes mellitus   . Hypothyroidism   . Hyperlipidemia   . Hypertension   . Chronic pain   . OA (osteoarthritis)   . COPD (chronic obstructive pulmonary disease)   . RA (rheumatoid arthritis)   . Hepatitis C   . Venous stasis   . Bipolar 1 disorder   . Hepatitis C   . Fibromyalgia   . Dysrhythmia     PAF  . Asthma   . Anxiety   . Melanoma     face, shoulder arm  . Pancreatitis, acute 2015   Past Surgical History  Procedure Laterality Date  . Thyroidectomy    . Tonsillectomy    . US echocardiography  02/19/2010    EF 55-60%  . Skin cancer excision      melonoma  . Fracture surgery Left     arm  . Submandibular gland excision Left 12/25/2013    w/sialolithotomy  . Inguinal hernia repair Bilateral   . Hernia repair      umbicial  . Knee surgery      /H&P 02/20/2010  . Submandibular gland excision Left 12/25/2013    Procedure: SUBMANDIBULAR GLAND STONE AND CHRONIC SIALOLITHIASIS EXCISION;  Surgeon: Melida Quitter, MD;  Location: [facility] OR;  Service: ENT;  Laterality:  Left;   Family History  Problem Relation Age of Onset  . Hypertension Mother   . Alzheimer's disease Mother   . Heart disease Father    History  Substance Use Topics  . Smoking status: Current Every Day Smoker -- 1.00 packs/day for 43 years  . Smokeless tobacco: Not on file  . Alcohol Use: 0.6 oz/week    1 Cans of beer per week    Review of Systems  Constitutional: Negative for chills.  Gastrointestinal: Negative for nausea and vomiting.  Neurological: Negative for dizziness and weakness.  All other systems reviewed and are negative.     Allergies  Antihistamines, chlorpheniramine-type; Iodinated diagnostic agents; Lidocaine; Methadone; Other; Oxycodone hcl; Pentazocine lactate; Sulfa antibiotics; and Vicodin  Home Medications   Prior to Admission medications   Medication Sig Start Date End Date Taking? Authorizing Provider  amLODipine (NORVASC) 5 MG tablet Take 1 tablet (5 mg total) by mouth daily. 08/16/14   Britt Bottom, NP  amLODipine (NORVASC) 5 MG tablet Take 1 tablet (5 mg total) by mouth daily. 08/28/14   Lance Bosch, NP  cholecalciferol (VITAMIN D) 1000 UNITS tablet Take 2,000 Units by mouth every evening.     Historical Provider, MD  clindamycin (CLEOCIN) 300 MG capsule Take 300 mg by mouth 3 (three) times daily.  07/01/13   Historical Provider, MD  clindamycin (CLEOCIN) 300 MG capsule Take 1 capsule (300 mg total) by mouth 3 (three) times daily. 12/26/13   Melida Quitter, MD  COUMADIN 10 MG tablet Take 1 tablet (10 mg total) by mouth as directed. 08/20/14   Thayer Headings, MD  COUMADIN 7.5 MG tablet Take 1 tablet (7.5 mg total) by mouth as directed. 08/20/14   Thayer Headings, MD  diazepam (VALIUM) 10 MG tablet Take 10 mg by mouth 4 (four) times daily as needed. For anxiety    Historical Provider, MD  fluticasone (FLONASE) 50 MCG/ACT nasal spray Place 1 spray into both nostrils daily as needed for allergies.     Historical Provider, MD  insulin glargine (LANTUS) 100  unit/mL SOPN Inject 0.27 mLs (27 Units total) into the skin at bedtime. PEN 08/16/14   Britt Bottom, NP  Insulin Pen Needle (SURE COMFORT PEN NEEDLES) 31G X 8 MM MISC Use insulin once nightly for E11.9 08/28/14   Lance Bosch, NP  levothyroxine (SYNTHROID, LEVOTHROID) 88 MCG tablet Take 1 tablet (88 mcg total) by mouth daily. 08/16/14   Britt Bottom, NP  levothyroxine (SYNTHROID, LEVOTHROID) 88 MCG tablet Take 1 tablet (88 mcg total) by mouth daily. 08/28/14   Lance Bosch, NP  lisinopril (PRINIVIL,ZESTRIL) 40 MG tablet Take 1 tablet (40 mg total) by mouth daily. 08/16/14   Britt Bottom, NP  lisinopril (PRINIVIL,ZESTRIL) 40 MG tablet Take 1 tablet (40 mg total) by mouth daily. 08/28/14   Lance Bosch, NP  loratadine (CLARITIN) 10 MG tablet Take 1 tablet (10 mg total) by mouth daily as needed for allergies. 08/16/14   Britt Bottom, NP  methocarbamol (ROBAXIN) 500 MG tablet Take 500 mg by mouth at bedtime as needed for muscle spasms.     Historical Provider, MD  metoprolol (LOPRESSOR) 50 MG tablet Take 1 tablet (50 mg total) by mouth daily. 08/28/14   Lance Bosch, NP  metoprolol succinate (TOPROL-XL) 50 MG 24 hr tablet Take 1 tablet (50 mg total) by mouth daily. Take with or immediately following a meal. 08/16/14   Britt Bottom, NP  Multiple Vitamin (MULTIVITAMIN) tablet Take 1 tablet by mouth every evening.     Historical Provider, MD  omeprazole (PRILOSEC) 40 MG capsule Take 1 capsule (40 mg total) by mouth daily. 08/28/14   Lance Bosch, NP  oxycodone (ROXICODONE) 30 MG immediate release tablet Take 30 mg by mouth 5 (five) times daily. Take 2 tablets in the morning,1 tablet at lunch and 2 tablets in the afternoon. 07/17/12   Thayer Headings, MD  oxyCODONE (ROXICODONE) 30 MG immediate release tablet Take 1 tablet (30 mg total) by mouth 5 (five) times daily. Patient not taking: Reported on 08/28/2014 12/26/13   Melida Quitter, MD  penicillin v potassium (VEETID) 500 MG tablet Take  500 mg by mouth 4 (four) times daily as needed (for flare up in legs).     Historical Provider, MD  promethazine (PHENERGAN) 25 MG suppository Place 25 mg rectally every 6 (six) hours as needed for nausea or vomiting.    Historical Provider, MD  simvastatin (ZOCOR) 20 MG tablet Take 1 tablet (20 mg total) by mouth every evening. 08/16/14   Britt Bottom, NP  simvastatin (ZOCOR) 20 MG tablet Take 1 tablet (20 mg total) by mouth daily. 08/28/14   Lance Bosch, NP  tiotropium [facility]) 18  MCG inhalation capsule Place 1 capsule (18 mcg total) into inhaler and inhale daily. 08/16/14   Britt Bottom, NP   BP 154/119 mmHg  Pulse 54  Temp(Src) 98.3 F (36.8 C) (Oral)  Resp 16  SpO2 96% Physical Exam  Constitutional: He is oriented to person, place, and time. He appears well-developed and well-nourished.  HENT:  Head: Normocephalic and atraumatic.  Eyes: Conjunctivae are normal.  Neck: Normal range of motion. Neck supple.  Cardiovascular: Normal rate, regular rhythm and normal heart sounds.   Pulmonary/Chest: Effort normal and breath sounds normal.  Abdominal: Soft. There is no tenderness.  Musculoskeletal: Normal range of motion.  Left 2nd toe is swollen with coagulated blood at the tip.  It is not tender to palpation.  He has full range of motion of his foot and all toes.  He is able to ambulate.  He has good dp and pt pulses.  No swelling of the left ankle or leg.  No pitting edema.   Neurological: He is alert and oriented to person, place, and time.  He has poor sensation in bilateral toes.   Skin: Skin is warm and dry.  Nursing note and vitals reviewed.   ED Course  Procedures (including critical care time) Labs Review Labs Reviewed  CBC WITH DIFFERENTIAL/PLATELET - Abnormal; Notable for the following:    WBC 10.8 (*)    All other components within normal limits  BASIC METABOLIC PANEL - Abnormal; Notable for the following:    Glucose, Bld 131 (*)    Creatinine, Ser 2.15  (*)    Calcium 8.0 (*)    GFR calc non Af Amer 32 (*)    GFR calc Af Amer 37 (*)    Anion gap 3 (*)    All other components within normal limits    Imaging Review Dg Foot 2 Views Left  09/23/2014   CLINICAL DATA:  Left foot and leg swelling. Cellulitis. Open wound on the distal tip of the second toe.  EXAM: LEFT FOOT - 2 VIEW  COMPARISON:  Radiographs dated 08/05/2009 and 01/19/2009  FINDINGS: There is destruction of tip of the distal phalangeal bone of the second toe consistent with osteomyelitis.  There is soft tissue swelling of the forefoot. There is minimal dorsal spurring in the midfoot. Small plantar calcaneal spur. Minimal osteophyte formation on the anterior aspect of the distal tibia.  IMPRESSION: Osteomyelitis of the distal phalanx of the second toe.   Electronically Signed   By: Lorriane Shire M.D.   On: 09/23/2014 20:05     EKG Interpretation None      MDM   Final diagnoses:  Osteomyelitis   He presents with left foot, second toe swelling and erythema for the past week.  He states he hit his foot on something recently.  He denies any fever or chills. He has a history of neuropathy.  His WBC is 10.8 and the xray of his food shows osteomyelitis of the distal phalanx of the second toe.  His is afebrile and his vitals are stable. He has no DVT on doppler.  20:45 I have started him on vancomycin and zosyn.  I spoke to the patient regarding admission for notomyelitis and he agrees with the plan.   I discussed this patient with Dr. Tomi Bamberger who will consult for admission and agrees with the plan.    Ottie Glazier, PA-C 09/23/14 2114

## 2014-09-23 NOTE — H&P (Signed)
[facility] History and Physical  TALLAN SANDOZ GBT:[id number] DOB: 06-11-1956 DOA: 09/23/2014  Referring physician: ED physician PCP: Chari Manning, NP  Specialists:   Chief Complaint:  left second toe pain  HPI: Johnny Sims is a 59 y.o. male with past medical history of diabetes mellitus, COPD, asthma, rheumatoid arthritis, hepatitis C, bipolar disorder, history of melanoma, history of pancreatitis, PAF on Coumadin, hypertension, hyperlipidemia, hypothyroidism, who presents with left second toe pain  Patient has a chronic diabetic neuropathy with leg pain chronically. Patient reports that he noticed pain and callus over his left second toe for 3 to 4 weeks. He said he has a left leg pain and swelling, for which he had 3 venous Doppler in the past 7 weeks.  No DVT was found in his leg by venous Doppler. Patient denies fever, chills, headaches, cough, chest pain, SOB, abdominal pain, diarrhea, constipation, dysuria, urgency, frequency, hematuria, skin rashes. No unilateral weakness, numbness or tingling sensations. No vision change or hearing loss.  In ED, patient was found to have osteomyelitis by X-Rahul of left foot. WBC 10.8. No tachycardia, temperature 98.3. Creatinine up from recent 1.09 on 12/25/13-2.16, BUN 16. Patient is admitted to inpatient for further evaluation and treatment. Orthopedic surgeon was consulted by ED.  Review of Systems: As presented in the history of presenting illness, rest negative.  Where does patient live?  At home Can patient participate in ADLs? Yes  Allergy:  Allergies  Allergen Reactions  . Antihistamines, Chlorpheniramine-Type Other (See Comments)    unknown  . Iodinated Diagnostic Agents Other (See Comments)    Patient states he doesn't know what iodine is and doesn't think he is allergic to it despite it being listed with his allergies  . Lidocaine Itching    Patient is uncertain of this allergy  . Methadone Other (See Comments)    "I won't take  it anymore"  . Other Hives    Steroid creams  . Oxycodone Hcl     Makes hyper  . Pentazocine Lactate Other (See Comments)    unknown  . Sulfa Antibiotics Itching and Other (See Comments)    whelps  . Vicodin [Hydrocodone-Acetaminophen] Other (See Comments)    Doesn't want to take    Past Medical History  Diagnosis Date  . PAF (paroxysmal atrial fibrillation)   . Diabetes mellitus   . Hypothyroidism   . Hyperlipidemia   . Hypertension   . Chronic pain   . OA (osteoarthritis)   . COPD (chronic obstructive pulmonary disease)   . RA (rheumatoid arthritis)   . Hepatitis C   . Venous stasis   . Bipolar 1 disorder   . Hepatitis C   . Fibromyalgia   . Dysrhythmia     PAF  . Asthma   . Anxiety   . Melanoma     face, shoulder arm  . Pancreatitis, acute 2015    Past Surgical History  Procedure Laterality Date  . Thyroidectomy    . Tonsillectomy    . US echocardiography  02/19/2010    EF 55-60%  . Skin cancer excision      melonoma  . Fracture surgery Left     arm  . Submandibular gland excision Left 12/25/2013    w/sialolithotomy  . Inguinal hernia repair Bilateral   . Hernia repair      umbicial  . Knee surgery      /H&P 02/20/2010  . Submandibular gland excision Left 12/25/2013    Procedure: SUBMANDIBULAR GLAND STONE  AND CHRONIC SIALOLITHIASIS EXCISION;  Surgeon: Melida Quitter, MD;  Location: [facility];  Service: ENT;  Laterality: Left;    Social History:  reports that he has been smoking.  He does not have any smokeless tobacco history on file. He reports that he drinks about 0.6 oz of alcohol per week. He reports that he does not use illicit drugs.  Family History:  Family History  Problem Relation Age of Onset  . Hypertension Mother   . Alzheimer's disease Mother   . Heart disease Father      Prior to Admission medications   Medication Sig Start Date End Date Taking? Authorizing Provider  amLODipine (NORVASC) 5 MG tablet Take 1 tablet (5 mg total) by mouth daily.  08/16/14   Britt Bottom, NP  amLODipine (NORVASC) 5 MG tablet Take 1 tablet (5 mg total) by mouth daily. 08/28/14   Lance Bosch, NP  cholecalciferol (VITAMIN D) 1000 UNITS tablet Take 2,000 Units by mouth every evening.     Historical Provider, MD  clindamycin (CLEOCIN) 300 MG capsule Take 300 mg by mouth 3 (three) times daily.  07/01/13   Historical Provider, MD  clindamycin (CLEOCIN) 300 MG capsule Take 1 capsule (300 mg total) by mouth 3 (three) times daily. 12/26/13   Melida Quitter, MD  COUMADIN 10 MG tablet Take 1 tablet (10 mg total) by mouth as directed. 08/20/14   Thayer Headings, MD  COUMADIN 7.5 MG tablet Take 1 tablet (7.5 mg total) by mouth as directed. 08/20/14   Thayer Headings, MD  diazepam (VALIUM) 10 MG tablet Take 10 mg by mouth 4 (four) times daily as needed. For anxiety    Historical Provider, MD  fluticasone (FLONASE) 50 MCG/ACT nasal spray Place 1 spray into both nostrils daily as needed for allergies.     Historical Provider, MD  insulin glargine (LANTUS) 100 unit/mL SOPN Inject 0.27 mLs (27 Units total) into the skin at bedtime. PEN 08/16/14   Britt Bottom, NP  Insulin Pen Needle (SURE COMFORT PEN NEEDLES) 31G X 8 MM MISC Use insulin once nightly for E11.9 08/28/14   Lance Bosch, NP  levothyroxine (SYNTHROID, LEVOTHROID) 88 MCG tablet Take 1 tablet (88 mcg total) by mouth daily. 08/16/14   Britt Bottom, NP  levothyroxine (SYNTHROID, LEVOTHROID) 88 MCG tablet Take 1 tablet (88 mcg total) by mouth daily. 08/28/14   Lance Bosch, NP  lisinopril (PRINIVIL,ZESTRIL) 40 MG tablet Take 1 tablet (40 mg total) by mouth daily. 08/16/14   Britt Bottom, NP  lisinopril (PRINIVIL,ZESTRIL) 40 MG tablet Take 1 tablet (40 mg total) by mouth daily. 08/28/14   Lance Bosch, NP  loratadine (CLARITIN) 10 MG tablet Take 1 tablet (10 mg total) by mouth daily as needed for allergies. 08/16/14   Britt Bottom, NP  methocarbamol (ROBAXIN) 500 MG tablet Take 500 mg by mouth at bedtime  as needed for muscle spasms.     Historical Provider, MD  metoprolol (LOPRESSOR) 50 MG tablet Take 1 tablet (50 mg total) by mouth daily. 08/28/14   Lance Bosch, NP  metoprolol succinate (TOPROL-XL) 50 MG 24 hr tablet Take 1 tablet (50 mg total) by mouth daily. Take with or immediately following a meal. 08/16/14   Britt Bottom, NP  Multiple Vitamin (MULTIVITAMIN) tablet Take 1 tablet by mouth every evening.     Historical Provider, MD  omeprazole (PRILOSEC) 40 MG capsule Take 1 capsule (40 mg total) by mouth daily. 08/28/14   Lance Bosch, NP  oxycodone (ROXICODONE) 30 MG immediate release tablet Take 30 mg by mouth 5 (five) times daily. Take 2 tablets in the morning,1 tablet at lunch and 2 tablets in the afternoon. 07/17/12   Thayer Headings, MD  oxyCODONE (ROXICODONE) 30 MG immediate release tablet Take 1 tablet (30 mg total) by mouth 5 (five) times daily. Patient not taking: Reported on 08/28/2014 12/26/13   Melida Quitter, MD  penicillin v potassium (VEETID) 500 MG tablet Take 500 mg by mouth 4 (four) times daily as needed (for flare up in legs).     Historical Provider, MD  promethazine (PHENERGAN) 25 MG suppository Place 25 mg rectally every 6 (six) hours as needed for nausea or vomiting.    Historical Provider, MD  simvastatin (ZOCOR) 20 MG tablet Take 1 tablet (20 mg total) by mouth every evening. 08/16/14   Britt Bottom, NP  simvastatin (ZOCOR) 20 MG tablet Take 1 tablet (20 mg total) by mouth daily. 08/28/14   Lance Bosch, NP  tiotropium (SPIRIVA) 18 MCG inhalation capsule Place 1 capsule (18 mcg total) into inhaler and inhale daily. 08/16/14   Britt Bottom, NP    Physical Exam: Filed Vitals:   09/23/14 2100 09/23/14 2130 09/23/14 2234 09/23/14 2330  BP: 151/102 159/103 160/105 147/82  Pulse: 88 88 119 80  Temp:   97.6 F (36.4 C)   TempSrc:   Oral   Resp:   20   Height:   '6\' 9"'$  (2.057 m)   Weight:   136.76 kg (301 lb 8 oz)   SpO2: 99% 99% 99%    General: Not in acute  distress HEENT:       Eyes: PERRL, EOMI, no scleral icterus       ENT: No discharge from the ears and nose, no pharynx injection, no tonsillar enlargement.        Neck: No JVD, no bruit, no mass felt. Cardiac: S1/S2, irregularly irregular rhythm, No murmurs, No gallops or rubs Pulm: Good air movement bilaterally. Clear to auscultation bilaterally. No rales, wheezing, rhonchi or rubs. Abd: Soft, nondistended, nontender, no rebound pain, no organomegaly, BS present Ext: No edema bilaterally. 2+DP/PT pulse bilaterally. Left 2nd toe is swollen with coagulated blood at the tip. mildly tender to palpation. He has full range of motion of his foot and all toes.  Musculoskeletal: No joint deformities, erythema, or stiffness, ROM full Skin: No rashes.  Neuro: Alert and oriented X3, cranial nerves II-XII grossly intact, muscle strength 5/5 in all extremeties, sensation to light touch intact.  Psych: Patient is not psychotic, no suicidal or hemocidal ideation.  Labs on Admission:  Basic Metabolic Panel:  Recent Labs Lab 09/23/14 1852  NA 140  K 3.8  CL 108  CO2 29  GLUCOSE 131*  BUN 16  CREATININE 2.15*  CALCIUM 8.0*   Liver Function Tests: No results for input(s): AST, ALT, ALKPHOS, BILITOT, PROT, ALBUMIN in the last 168 hours. No results for input(s): LIPASE, AMYLASE in the last 168 hours. No results for input(s): AMMONIA in the last 168 hours. CBC:  Recent Labs Lab 09/23/14 1852  WBC 10.8*  NEUTROABS 6.4  HGB 14.0  HCT 39.5  MCV 82.8  PLT 219   Cardiac Enzymes: No results for input(s): CKTOTAL, CKMB, CKMBINDEX, TROPONINI in the last 168 hours.  BNP (last 3 results) No results for input(s): BNP in the last 8760 hours.  ProBNP (last 3 results) No results for input(s): PROBNP in the last 8760 hours.  CBG:  Recent Labs  Lab 09/23/14 2238  GLUCAP 133*    Radiological Exams on Admission: Dg Foot 2 Views Left  09/23/2014   CLINICAL DATA:  Left foot and leg swelling.  Cellulitis. Open wound on the distal tip of the second toe.  EXAM: LEFT FOOT - 2 VIEW  COMPARISON:  Radiographs dated 08/05/2009 and 01/19/2009  FINDINGS: There is destruction of tip of the distal phalangeal bone of the second toe consistent with osteomyelitis.  There is soft tissue swelling of the forefoot. There is minimal dorsal spurring in the midfoot. Small plantar calcaneal spur. Minimal osteophyte formation on the anterior aspect of the distal tibia.  IMPRESSION: Osteomyelitis of the distal phalanx of the second toe.   Electronically Signed   By: Lorriane Shire M.D.   On: 09/23/2014 20:05    EKG: will get one  Assessment/Plan Principal Problem:   Osteomyelitis of left foot Active Problems:   Paroxysmal atrial fibrillation   Diabetes mellitus   Hyperlipidemia   Essential hypertension   Bipolar disease, chronic   Hepatitis C   COPD (chronic obstructive pulmonary disease)   Long term current use of anticoagulant therapy   Tobacco use disorder   Osteomyelitis   Osteomyelitis of foot, acute   Osteomyelitis: Patient is not obviously septic on admission. Hemodynamically stable. -  Admit to tele bed given Afib - Empiric antimicrobial treatment with vancomycin IV and zosyn - IVF: 1 L NS bolus -->then followed by 100 cc/h  - Blood cultures x 2 - Symptomatic treatment: prn Tylenol for fever, prn zofran for nausea and Percocet for pain.  - lactic acid, ESR and Crp -INR/PTT, type and screen -Follow-up ortho recommendation  Atrial Fibrillation: CHA2DS2-VASc Score is 3 (HTN, DM and possible PVD)    , needs oral anticoagulation. Patient is on Coumadin at home. INR is  1.9 on admission. Heart rate is well controlled. -will switch to IV heparin gtt -continue metoprolol for rate control  Hyperlipidemia: LDL was a 33 on 02/19/10 -Continue Zocor  Diabetes mellitus: Patient was on Lantus 27 units daily -decrease Lantus to 20 units daily  -sliding scale insulin  Hypertension: -Continue  amlodipine, lisinopril, Toprol  AKI: Creatinine is up from a 1.09 on 12/25/13-2.15, BUN is 16 -check US-renal -FeNa -try IVF as above -hold lisinopril  COPD: No signs of acute exacerbation. -Continue spirava  Tobacco abuse:  -Counseled about the importance of quitting smoking  -nicotine patch    DVT ppx: On IV Heparin       Code Status: Full code Family Communication: None at bed side.       Disposition Plan: Admit to inpatient   Date of Service 09/24/2014    Ivor Costa [facility] Pager [phone]  If 7PM-7AM, please contact night-coverage [email or web address] Password [email or web address] 09/24/2014, 2:08 AM

## 2014-09-23 NOTE — ED Notes (Signed)
Patient transported to [facility] 

## 2014-09-23 NOTE — Progress Notes (Signed)
VASCULAR LAB PRELIMINARY  PRELIMINARY  PRELIMINARY  PRELIMINARY  Bilateral lower extremity venous duplex completed.    Preliminary report:  Bilateral:  No evidence of DVT, superficial thrombosis, or Baker's Cyst.   Sekai Nayak, RVS 09/23/2014, 5:05 PM

## 2014-09-23 NOTE — ED Provider Notes (Signed)
Medical screening examination/treatment/procedure(s) were conducted as a shared visit with non-physician practitioner(s) and myself.  I personally evaluated the patient during the encounter.  Pt has callus noted distal aspect of left 2nd toe.  Strong dp pulse.  No DVT noted on doppler study.  Will check labs and xrays.  He complains of neuropathy.  Pt is not on neurontin.   Could consider adding to his regimen.  Xray suggest osteomyelitis.  Will start on IV abx.  Consult with the medical service for admission  Dorie Rank, MD 09/23/14 2045

## 2014-09-23 NOTE — ED Notes (Signed)
Called pharmacy to inquire about zosyn rate.  Pharmacy verified correct dose of 180ml/hr. Dr. Tomi Bamberger notified.

## 2014-09-24 ENCOUNTER — Inpatient Hospital Stay (HOSPITAL_COMMUNITY): Payer: Medicaid Other

## 2014-09-24 ENCOUNTER — Encounter (HOSPITAL_COMMUNITY): Payer: Self-pay

## 2014-09-24 ENCOUNTER — Other Ambulatory Visit: Payer: Self-pay | Admitting: Internal Medicine

## 2014-09-24 LAB — RAPID URINE DRUG SCREEN, HOSP PERFORMED
Amphetamines: NOT DETECTED
BARBITURATES: NOT DETECTED
Benzodiazepines: NOT DETECTED
Cocaine: NOT DETECTED
Opiates: POSITIVE — AB
TETRAHYDROCANNABINOL: NOT DETECTED

## 2014-09-24 LAB — COMPREHENSIVE METABOLIC PANEL
ALT: 20 U/L (ref 0–53)
AST: 26 U/L (ref 0–37)
Albumin: 1.6 g/dL — ABNORMAL LOW (ref 3.5–5.2)
Alkaline Phosphatase: 49 U/L (ref 39–117)
Anion gap: 4 — ABNORMAL LOW (ref 5–15)
BUN: 15 mg/dL (ref 6–23)
CALCIUM: 7.5 mg/dL — AB (ref 8.4–10.5)
CHLORIDE: 110 mmol/L (ref 96–112)
CO2: 25 mmol/L (ref 19–32)
Creatinine, Ser: 2.2 mg/dL — ABNORMAL HIGH (ref 0.50–1.35)
GFR calc Af Amer: 36 mL/min — ABNORMAL LOW (ref >90)
GFR calc non Af Amer: 31 mL/min — ABNORMAL LOW (ref >90)
GLUCOSE: 179 mg/dL — AB (ref 70–99)
Potassium: 3.6 mmol/L (ref 3.5–5.1)
Sodium: 139 mmol/L (ref 135–145)
Total Bilirubin: 0.4 mg/dL (ref 0.3–1.2)
Total Protein: 4 g/dL — ABNORMAL LOW (ref 6.0–8.3)

## 2014-09-24 LAB — HEPARIN LEVEL (UNFRACTIONATED): Heparin Unfractionated: <0.1 IU/mL — ABNORMAL LOW (ref 0.30–0.70)

## 2014-09-24 LAB — ANA: Anti Nuclear Antibody(ANA): NEGATIVE

## 2014-09-24 LAB — CBC
HCT: 34.9 % — ABNORMAL LOW (ref 39.0–52.0)
Hemoglobin: 12.1 g/dL — ABNORMAL LOW (ref 13.0–17.0)
MCH: 29.3 pg (ref 26.0–34.0)
MCHC: 34.7 g/dL (ref 30.0–36.0)
MCV: 84.5 fL (ref 78.0–100.0)
PLATELETS: 186 10*3/uL (ref 150–400)
RBC: 4.13 MIL/uL — ABNORMAL LOW (ref 4.22–5.81)
RDW: 15.1 % (ref 11.5–15.5)
WBC: 8.7 10*3/uL (ref 4.0–10.5)

## 2014-09-24 LAB — PROTIME-INR
INR: 2.28 — ABNORMAL HIGH (ref <1.50)
INR: 2.11 — ABNORMAL HIGH (ref 0.00–1.49)
PROTHROMBIN TIME: 25.1 s — AB (ref 11.6–15.2)
Prothrombin Time: 23.8 seconds — ABNORMAL HIGH (ref 11.6–15.2)

## 2014-09-24 LAB — HEPATITIS B SURFACE ANTIGEN: Hepatitis B Surface Ag: NEGATIVE

## 2014-09-24 LAB — TYPE AND SCREEN
ABO/RH(D): O NEG
ANTIBODY SCREEN: NEGATIVE

## 2014-09-24 LAB — C-REACTIVE PROTEIN

## 2014-09-24 LAB — HEPATITIS A ANTIBODY, TOTAL: Hep A Total Ab: NONREACTIVE

## 2014-09-24 LAB — LACTIC ACID, PLASMA: Lactic Acid, Venous: 0.8 mmol/L (ref 0.5–2.0)

## 2014-09-24 LAB — SEDIMENTATION RATE: Sed Rate: 53 mm/hr — ABNORMAL HIGH (ref 0–16)

## 2014-09-24 LAB — GLUCOSE, CAPILLARY: GLUCOSE-CAPILLARY: 107 mg/dL — AB (ref 70–99)

## 2014-09-24 LAB — HEPATITIS B SURFACE ANTIBODY,QUALITATIVE: Hep B S Ab: POSITIVE — AB

## 2014-09-24 LAB — HEPATITIS C RNA QUANTITATIVE
HCV Quantitative Log: 6.21 {Log} — ABNORMAL HIGH (ref <1.18)
HCV Quantitative: 1616179 IU/mL — ABNORMAL HIGH (ref <15)

## 2014-09-24 LAB — CREATININE, URINE, RANDOM: CREATININE, URINE: 85.51 mg/dL

## 2014-09-24 LAB — HIV ANTIBODY (ROUTINE TESTING W REFLEX)
HIV 1&2 Ab, 4th Generation: NONREACTIVE
HIV SCREEN 4TH GENERATION: NONREACTIVE

## 2014-09-24 LAB — ABO/RH: ABO/RH(D): O NEG

## 2014-09-24 LAB — SODIUM, URINE, RANDOM: Sodium, Ur: 87 mmol/L

## 2014-09-24 LAB — HEPATITIS B CORE ANTIBODY, TOTAL: HEP B C TOTAL AB: REACTIVE — AB

## 2014-09-24 MED ORDER — AMOXICILLIN-POT CLAVULANATE 875-125 MG PO TABS: 1.0000 | ORAL_TABLET | Freq: Two times a day (BID) | ORAL | Status: DC

## 2014-09-24 MED ORDER — DOXYCYCLINE HYCLATE 50 MG PO CAPS: 100.0000 mg | ORAL_CAPSULE | Freq: Two times a day (BID) | ORAL | Status: DC

## 2014-09-24 MED ORDER — HEPARIN (PORCINE) IN NACL 100-0.45 UNIT/ML-% IJ SOLN
1400.0000 [IU]/h | INTRAMUSCULAR | Status: DC
  Administered 2014-09-24: 1400 [IU]/h via INTRAVENOUS
  Filled 2014-09-24 (×2): qty 250

## 2014-09-24 MED ORDER — NICOTINE 21 MG/24HR TD PT24: 21.0000 mg | MEDICATED_PATCH | Freq: Every day | TRANSDERMAL | Status: DC

## 2014-09-24 NOTE — Consult Note (Signed)
Reason for Consult: Chronic osteomyelitis left foot second toe Referring Physician: Dr. Evette Cristal Johnny Sims is an 59 y.o. male.  HPI: Patient is a 59 year old gentleman with chronic ulceration swelling and osteomyelitis left foot second toe  Past Medical History  Diagnosis Date  . PAF (paroxysmal atrial fibrillation)   . Diabetes mellitus   . Hypothyroidism   . Hyperlipidemia   . Hypertension   . Chronic pain   . OA (osteoarthritis)   . COPD (chronic obstructive pulmonary disease)   . RA (rheumatoid arthritis)   . Hepatitis C   . Venous stasis   . Bipolar 1 disorder   . Hepatitis C   . Fibromyalgia   . Dysrhythmia     PAF  . Asthma   . Anxiety   . Melanoma     face, shoulder arm  . Pancreatitis, acute 2015    Past Surgical History  Procedure Laterality Date  . Thyroidectomy    . Tonsillectomy    . US echocardiography  02/19/2010    EF 55-60%  . Skin cancer excision      melonoma  . Fracture surgery Left     arm  . Submandibular gland excision Left 12/25/2013    w/sialolithotomy  . Inguinal hernia repair Bilateral   . Hernia repair      umbicial  . Knee surgery      /H&P 02/20/2010  . Submandibular gland excision Left 12/25/2013    Procedure: SUBMANDIBULAR GLAND STONE AND CHRONIC SIALOLITHIASIS EXCISION;  Surgeon: Melida Quitter, MD;  Location: [facility] OR;  Service: ENT;  Laterality: Left;    Family History  Problem Relation Age of Onset  . Hypertension Mother   . Alzheimer's disease Mother   . Heart disease Father     Social History:  reports that he has been smoking.  He does not have any smokeless tobacco history on file. He reports that he drinks about 0.6 oz of alcohol per week. He reports that he does not use illicit drugs.  Allergies:  Allergies  Allergen Reactions  . Antihistamines, Chlorpheniramine-Type Other (See Comments)    unknown  . Iodinated Diagnostic Agents Other (See Comments)    Patient states he doesn't know what iodine is and doesn't think he  is allergic to it despite it being listed with his allergies  . Lidocaine Itching    Patient is uncertain of this allergy  . Methadone Other (See Comments)    "I won't take it anymore"  . Other Hives    Steroid creams  . Oxycodone Hcl     Makes hyper  . Pentazocine Lactate Other (See Comments)    unknown  . Sulfa Antibiotics Itching and Other (See Comments)    whelps  . Vicodin [Hydrocodone-Acetaminophen] Other (See Comments)    Doesn't want to take    Medications: I have reviewed the patient's current medications.  Results for orders placed or performed during the hospital encounter of 09/23/14 (from the past 48 hour(s))  CBC with Differential     Status: Abnormal   Collection Time: 09/23/14  6:52 PM  Result Value Ref Range   WBC 10.8 (H) 4.0 - 10.5 K/uL   RBC 4.77 4.22 - 5.81 MIL/uL   Hemoglobin 14.0 13.0 - 17.0 g/dL   HCT 39.5 39.0 - 52.0 %   MCV 82.8 78.0 - 100.0 fL   MCH 29.4 26.0 - 34.0 pg   MCHC 35.4 30.0 - 36.0 g/dL   RDW 14.9 11.5 - 15.5 %  Platelets 219 150 - 400 K/uL   Neutrophils Relative % 59 43 - 77 %   Neutro Abs 6.4 1.7 - 7.7 K/uL   Lymphocytes Relative 30 12 - 46 %   Lymphs Abs 3.2 0.7 - 4.0 K/uL   Monocytes Relative 8 3 - 12 %   Monocytes Absolute 0.8 0.1 - 1.0 K/uL   Eosinophils Relative 3 0 - 5 %   Eosinophils Absolute 0.3 0.0 - 0.7 K/uL   Basophils Relative 0 0 - 1 %   Basophils Absolute 0.0 0.0 - 0.1 K/uL  Basic metabolic panel     Status: Abnormal   Collection Time: 09/23/14  6:52 PM  Result Value Ref Range   Sodium 140 135 - 145 mmol/L   Potassium 3.8 3.5 - 5.1 mmol/L   Chloride 108 96 - 112 mmol/L   CO2 29 19 - 32 mmol/L   Glucose, Bld 131 (H) 70 - 99 mg/dL   BUN 16 6 - 23 mg/dL   Creatinine, Ser 2.15 (H) 0.50 - 1.35 mg/dL   Calcium 8.0 (L) 8.4 - 10.5 mg/dL   GFR calc non Af Amer 32 (L) >90 mL/min   GFR calc Af Amer 37 (L) >90 mL/min    Comment: (NOTE) The eGFR has been calculated using the CKD EPI equation. This calculation has not  been validated in all clinical situations. eGFR's persistently <90 mL/min signify possible Chronic Kidney Disease.    Anion gap 3 (L) 5 - 15  Glucose, capillary     Status: Abnormal   Collection Time: 09/23/14 10:38 PM  Result Value Ref Range   Glucose-Capillary 133 (H) 70 - 99 mg/dL  Lactic acid, plasma     Status: None   Collection Time: 09/23/14 11:20 PM  Result Value Ref Range   Lactic Acid, Venous 0.8 0.5 - 2.0 mmol/L  Type and screen     Status: None   Collection Time: 09/23/14 11:20 PM  Result Value Ref Range   ABO/RH(D) O NEG    Antibody Screen NEG    Sample Expiration 09/26/2014   ABO/Rh     Status: None   Collection Time: 09/23/14 11:20 PM  Result Value Ref Range   ABO/RH(D) O NEG   Sedimentation rate     Status: Abnormal   Collection Time: 09/23/14 11:30 PM  Result Value Ref Range   Sed Rate 53 (H) 0 - 16 mm/hr  APTT     Status: None   Collection Time: 09/23/14 11:30 PM  Result Value Ref Range   aPTT 37 24 - 37 seconds    Comment:        IF BASELINE aPTT IS ELEVATED, SUGGEST PATIENT RISK ASSESSMENT BE USED TO DETERMINE APPROPRIATE ANTICOAGULANT THERAPY.   Protime-INR     Status: Abnormal   Collection Time: 09/23/14 11:30 PM  Result Value Ref Range   Prothrombin Time 22.8 (H) 11.6 - 15.2 seconds   INR 1.99 (H) 0.00 - 1.49  Creatinine, urine, random     Status: None   Collection Time: 09/24/14  1:44 AM  Result Value Ref Range   Creatinine, Urine 85.51 mg/dL  Sodium, urine, random     Status: None   Collection Time: 09/24/14  1:44 AM  Result Value Ref Range   Sodium, Ur 87 mmol/L  Urine rapid drug screen (hosp performed)     Status: Abnormal   Collection Time: 09/24/14  1:44 AM  Result Value Ref Range   Opiates POSITIVE (A) NONE DETECTED  Cocaine NONE DETECTED NONE DETECTED   Benzodiazepines NONE DETECTED NONE DETECTED   Amphetamines NONE DETECTED NONE DETECTED   Tetrahydrocannabinol NONE DETECTED NONE DETECTED   Barbiturates NONE DETECTED NONE  DETECTED    Comment:        DRUG SCREEN FOR MEDICAL PURPOSES ONLY.  IF CONFIRMATION IS NEEDED FOR ANY PURPOSE, NOTIFY LAB WITHIN 5 DAYS.        LOWEST DETECTABLE LIMITS FOR URINE DRUG SCREEN Drug Class       Cutoff (ng/mL) Amphetamine      1000 Barbiturate      200 Benzodiazepine   176 Tricyclics       160 Opiates          300 Cocaine          300 THC              50   Comprehensive metabolic panel     Status: Abnormal   Collection Time: 09/24/14  4:53 AM  Result Value Ref Range   Sodium 139 135 - 145 mmol/L   Potassium 3.6 3.5 - 5.1 mmol/L   Chloride 110 96 - 112 mmol/L   CO2 25 19 - 32 mmol/L   Glucose, Bld 179 (H) 70 - 99 mg/dL   BUN 15 6 - 23 mg/dL   Creatinine, Ser 2.20 (H) 0.50 - 1.35 mg/dL   Calcium 7.5 (L) 8.4 - 10.5 mg/dL   Total Protein 4.0 (L) 6.0 - 8.3 g/dL   Albumin 1.6 (L) 3.5 - 5.2 g/dL   AST 26 0 - 37 U/L   ALT 20 0 - 53 U/L   Alkaline Phosphatase 49 39 - 117 U/L   Total Bilirubin 0.4 0.3 - 1.2 mg/dL   GFR calc non Af Amer 31 (L) >90 mL/min   GFR calc Af Amer 36 (L) >90 mL/min    Comment: (NOTE) The eGFR has been calculated using the CKD EPI equation. This calculation has not been validated in all clinical situations. eGFR's persistently <90 mL/min signify possible Chronic Kidney Disease.    Anion gap 4 (L) 5 - 15  CBC     Status: Abnormal   Collection Time: 09/24/14  4:53 AM  Result Value Ref Range   WBC 8.7 4.0 - 10.5 K/uL   RBC 4.13 (L) 4.22 - 5.81 MIL/uL   Hemoglobin 12.1 (L) 13.0 - 17.0 g/dL   HCT 34.9 (L) 39.0 - 52.0 %   MCV 84.5 78.0 - 100.0 fL   MCH 29.3 26.0 - 34.0 pg   MCHC 34.7 30.0 - 36.0 g/dL   RDW 15.1 11.5 - 15.5 %   Platelets 186 150 - 400 K/uL  Protime-INR     Status: Abnormal   Collection Time: 09/24/14  4:53 AM  Result Value Ref Range   Prothrombin Time 23.8 (H) 11.6 - 15.2 seconds   INR 2.11 (H) 0.00 - 1.49  Glucose, capillary     Status: Abnormal   Collection Time: 09/24/14  7:53 AM  Result Value Ref Range    Glucose-Capillary 107 (H) 70 - 99 mg/dL    Dg Foot 2 Views Left  09/23/2014   CLINICAL DATA:  Left foot and leg swelling. Cellulitis. Open wound on the distal tip of the second toe.  EXAM: LEFT FOOT - 2 VIEW  COMPARISON:  Radiographs dated 08/05/2009 and 01/19/2009  FINDINGS: There is destruction of tip of the distal phalangeal bone of the second toe consistent with osteomyelitis.  There is soft tissue swelling  of the forefoot. There is minimal dorsal spurring in the midfoot. Small plantar calcaneal spur. Minimal osteophyte formation on the anterior aspect of the distal tibia.  IMPRESSION: Osteomyelitis of the distal phalanx of the second toe.   Electronically Signed   By: Lorriane Shire M.D.   On: 09/23/2014 20:05    Review of Systems  All other systems reviewed and are negative.  Blood pressure 148/89, pulse 54, temperature 97.7 F (36.5 C), temperature source Oral, resp. rate 18, height $RemoveBe'6\' 9"'jjrAnrsvD$  (2.057 m), weight 136.76 kg (301 lb 8 oz), SpO2 97 %. Physical Exam On examination patient has venous stasis insufficiency with brawny skin color changes left leg. Patient has a strong dorsalis pedis pulse. Patient has sausage digit swelling of the second toe with ulceration and radiographs which show chronic osteomyelitis of the second toe. There is no ascending cellulitis. Assessment/Plan: Assessment: Diabetic insensate neuropathy with venous stasis insufficiency and chronic osteomyelitis left foot second toe.  Plan: Patient states that he cannot proceed with surgery at this time he states he has to go home to take care of bills. I feel that this osteomyelitis will require a second Ingram amputation of the second toe. It should be safe to wait until patient is ready to proceed with surgery. Patient may be discharged home on oral antibiotics either Keflex or doxycycline. I will follow-up in the office on Monday. Patient does not need to change his Coumadin dosage for his atrial fibrillation. We will plan for  surgery with patient continuing his Coumadin.  Grayland Daisey V 09/24/2014, 8:20 AM

## 2014-09-24 NOTE — Progress Notes (Signed)
ANTICOAGULATION CONSULT NOTE - Follow Up Consult  Pharmacy Consult for Heparin (warfarin on hold) Indication: atrial fibrillation  Allergies  Allergen Reactions  . Antihistamines, Chlorpheniramine-Type Other (See Comments)    unknown  . Iodinated Diagnostic Agents Other (See Comments)    Patient states he doesn't know what iodine is and doesn't think he is allergic to it despite it being listed with his allergies  . Lidocaine Itching    Patient is uncertain of this allergy  . Methadone Other (See Comments)    "I won't take it anymore"  . Other Hives    Steroid creams  . Oxycodone Hcl     Makes hyper  . Pentazocine Lactate Other (See Comments)    unknown  . Sulfa Antibiotics Itching and Other (See Comments)    whelps  . Vicodin [Hydrocodone-Acetaminophen] Other (See Comments)    Doesn't want to take    Patient Measurements: Height: 6\' 9"  (205.7 cm) Weight: (!) 301 lb 8 oz (136.76 kg) IBW/kg (Calculated) : 98.3   Vital Signs: Temp: 97.6 F (36.4 C) (03/29 2234) Temp Source: Oral (03/29 2234) BP: 160/105 mmHg (03/29 2234) Pulse Rate: 119 (03/29 2234)  Labs:  Recent Labs  09/23/14 1852 09/23/14 2330  HGB 14.0  --   HCT 39.5  --   PLT 219  --   APTT  --  37  LABPROT  --  22.8*  INR  --  1.99*  CREATININE 2.15*  --     Estimated Creatinine Clearance: 60.2 mL/min (by C-G formula based on Cr of 2.15).  Assessment: Starting heparin and holding warfarin due to possible need for surgical intervention. INR is 1.99, will start heparin now. CBC good. Noted renal dysfunction.   Goal of Therapy:  Heparin level 0.3-0.7 units/ml Monitor platelets by anticoagulation protocol: Yes   Plan:  -Start heparin at 1400 units/hr, no bolus with elevated INR  -0800 HL -Daily CBC/HL -Monitor for bleeding  Narda Bonds 09/24/2014,12:05 AM

## 2014-09-24 NOTE — Progress Notes (Signed)
Pt adm to rm 22 via stretcher, a/o x4, NSR w/PAC's, tele box #[id number]. Skin W/D/I. LLE 3rd toe callus, edema present, pedal pulses palpable 2+. Pt oriented to unit, fall protocols, and poc, expressed understanding.

## 2014-09-24 NOTE — Discharge Summary (Signed)
Physician Discharge Summary  Johnny Sims MRN: [id number] DOB/AGE: [birth year] 59 y.o.  PCP: Holland Commons, NP   Admit date: 09/23/2014 Discharge date: 09/24/2014  Discharge Diagnoses:   Principal Problem:   Osteomyelitis of left foot Active Problems:   Paroxysmal atrial fibrillation   Diabetes mellitus   Hyperlipidemia   Essential hypertension   Bipolar disease, chronic   Hepatitis C   COPD (chronic obstructive pulmonary disease)   Long term current use of anticoagulant therapy   Tobacco use disorder   Osteomyelitis   Osteomyelitis of foot, acute  left AGAINST MEDICAL ADVICE    Follow-up recommendations Patient very unhappy with his PCP not sure if he will be compliant Unhappy that he has not been receiving his oxycodone prescriptions from his PCP office Apparently has an outpatient follow-up with Dr. Lajoyce Corners next week Would recommend CMP, CBC, INR before surgery to ensure that renal function is stable and INR is not supratherapeutic     Medication List    STOP taking these medications        lisinopril 40 MG tablet  Commonly known as:  PRINIVIL,ZESTRIL     penicillin v potassium 500 MG tablet  Commonly known as:  VEETID      TAKE these medications        amoxicillin-clavulanate 875-125 MG per tablet  Commonly known as:  AUGMENTIN  Take 1 tablet by mouth 2 (two) times daily.     cholecalciferol 1000 UNITS tablet  Commonly known as:  VITAMIN D  Take 2,000 Units by mouth every evening.     COUMADIN 7.5 MG tablet  Generic drug:  warfarin  Take 1 tablet (7.5 mg total) by mouth as directed.     COUMADIN 10 MG tablet  Generic drug:  warfarin  Take 1 tablet (10 mg total) by mouth as directed.     diazepam 10 MG tablet  Commonly known as:  VALIUM  Take 10 mg by mouth 4 (four) times daily as needed. For anxiety     doxycycline 50 MG capsule  Commonly known as:  VIBRAMYCIN  Take 2 capsules (100 mg total) by mouth 2 (two) times daily.     fluticasone 50  MCG/ACT nasal spray  Commonly known as:  FLONASE  Place 1 spray into both nostrils daily as needed for allergies.     insulin glargine 100 unit/mL Sopn  Commonly known as:  LANTUS  Inject 0.27 mLs (27 Units total) into the skin at bedtime. PEN     Insulin Pen Needle 31G X 8 MM Misc  Commonly known as:  SURE COMFORT PEN NEEDLES  Use insulin once nightly for E11.9     levothyroxine 88 MCG tablet  Commonly known as:  SYNTHROID, LEVOTHROID  Take 1 tablet (88 mcg total) by mouth daily.     loratadine 10 MG tablet  Commonly known as:  CLARITIN  Take 1 tablet (10 mg total) by mouth daily as needed for allergies.     methocarbamol 500 MG tablet  Commonly known as:  ROBAXIN  Take 500 mg by mouth at bedtime as needed for muscle spasms.     metoprolol 50 MG tablet  Commonly known as:  LOPRESSOR  Take 1 tablet (50 mg total) by mouth daily.     multivitamin tablet  Take 1 tablet by mouth every evening.     nicotine 21 mg/24hr patch  Commonly known as:  NICODERM CQ - dosed in mg/24 hours  Place 1 patch (21 mg  total) onto the skin daily.     omeprazole 40 MG capsule  Commonly known as:  PRILOSEC  Take 1 capsule (40 mg total) by mouth daily.     oxycodone 30 MG immediate release tablet  Commonly known as:  ROXICODONE  Take 30 mg by mouth 5 (five) times daily. Take 2 tablets in the morning,1 tablet at lunch and 2 tablets in the afternoon.     oxycodone 30 MG immediate release tablet  Commonly known as:  ROXICODONE  Take 1 tablet (30 mg total) by mouth 5 (five) times daily.     promethazine 25 MG suppository  Commonly known as:  PHENERGAN  Place 25 mg rectally every 6 (six) hours as needed for nausea or vomiting.     simvastatin 20 MG tablet  Commonly known as:  ZOCOR  Take 1 tablet (20 mg total) by mouth every evening.     simvastatin 20 MG tablet  Commonly known as:  ZOCOR  Take 1 tablet (20 mg total) by mouth daily.     tiotropium 18 MCG inhalation capsule  Commonly known  as:  SPIRIVA  Place 1 capsule (18 mcg total) into inhaler and inhale daily.        Discharge Condition: Patient left AGAINST MEDICAL ADVICE  Disposition: 07-Left Against Medical Advice   Consults: Orthopedics   Significant Diagnostic Studies: Dg Foot 2 Views Left  09/23/2014   CLINICAL DATA:  Left foot and leg swelling. Cellulitis. Open wound on the distal tip of the second toe.  EXAM: LEFT FOOT - 2 VIEW  COMPARISON:  Radiographs dated 08/05/2009 and 01/19/2009  FINDINGS: There is destruction of tip of the distal phalangeal bone of the second toe consistent with osteomyelitis.  There is soft tissue swelling of the forefoot. There is minimal dorsal spurring in the midfoot. Small plantar calcaneal spur. Minimal osteophyte formation on the anterior aspect of the distal tibia.  IMPRESSION: Osteomyelitis of the distal phalanx of the second toe.   Electronically Signed   By: Lorriane Shire M.D.   On: 09/23/2014 20:05      Microbiology: No results found for this or any previous visit (from the past 240 hour(s)).   Labs: Results for orders placed or performed during the hospital encounter of 09/23/14 (from the past 48 hour(s))  CBC with Differential     Status: Abnormal   Collection Time: 09/23/14  6:52 PM  Result Value Ref Range   WBC 10.8 (H) 4.0 - 10.5 K/uL   RBC 4.77 4.22 - 5.81 MIL/uL   Hemoglobin 14.0 13.0 - 17.0 g/dL   HCT 39.5 39.0 - 52.0 %   MCV 82.8 78.0 - 100.0 fL   MCH 29.4 26.0 - 34.0 pg   MCHC 35.4 30.0 - 36.0 g/dL   RDW 14.9 11.5 - 15.5 %   Platelets 219 150 - 400 K/uL   Neutrophils Relative % 59 43 - 77 %   Neutro Abs 6.4 1.7 - 7.7 K/uL   Lymphocytes Relative 30 12 - 46 %   Lymphs Abs 3.2 0.7 - 4.0 K/uL   Monocytes Relative 8 3 - 12 %   Monocytes Absolute 0.8 0.1 - 1.0 K/uL   Eosinophils Relative 3 0 - 5 %   Eosinophils Absolute 0.3 0.0 - 0.7 K/uL   Basophils Relative 0 0 - 1 %   Basophils Absolute 0.0 0.0 - 0.1 K/uL  Basic metabolic panel     Status: Abnormal    Collection Time: 09/23/14  6:52  PM  Result Value Ref Range   Sodium 140 135 - 145 mmol/L   Potassium 3.8 3.5 - 5.1 mmol/L   Chloride 108 96 - 112 mmol/L   CO2 29 19 - 32 mmol/L   Glucose, Bld 131 (H) 70 - 99 mg/dL   BUN 16 6 - 23 mg/dL   Creatinine, Ser 2.15 (H) 0.50 - 1.35 mg/dL   Calcium 8.0 (L) 8.4 - 10.5 mg/dL   GFR calc non Af Amer 32 (L) >90 mL/min   GFR calc Af Amer 37 (L) >90 mL/min    Comment: (NOTE) The eGFR has been calculated using the CKD EPI equation. This calculation has not been validated in all clinical situations. eGFR's persistently <90 mL/min signify possible Chronic Kidney Disease.    Anion gap 3 (L) 5 - 15  Glucose, capillary     Status: Abnormal   Collection Time: 09/23/14 10:38 PM  Result Value Ref Range   Glucose-Capillary 133 (H) 70 - 99 mg/dL  Lactic acid, plasma     Status: None   Collection Time: 09/23/14 11:20 PM  Result Value Ref Range   Lactic Acid, Venous 0.8 0.5 - 2.0 mmol/L  Type and screen     Status: None   Collection Time: 09/23/14 11:20 PM  Result Value Ref Range   ABO/RH(D) O NEG    Antibody Screen NEG    Sample Expiration 09/26/2014   ABO/Rh     Status: None   Collection Time: 09/23/14 11:20 PM  Result Value Ref Range   ABO/RH(D) O NEG   Sedimentation rate     Status: Abnormal   Collection Time: 09/23/14 11:30 PM  Result Value Ref Range   Sed Rate 53 (H) 0 - 16 mm/hr  C-reactive protein     Status: Abnormal   Collection Time: 09/23/14 11:30 PM  Result Value Ref Range   CRP <0.5 (L) <0.60 mg/dL    Comment: Performed at [facility]  APTT     Status: None   Collection Time: 09/23/14 11:30 PM  Result Value Ref Range   aPTT 37 24 - 37 seconds    Comment:        IF BASELINE aPTT IS ELEVATED, SUGGEST PATIENT RISK ASSESSMENT BE USED TO DETERMINE APPROPRIATE ANTICOAGULANT THERAPY.   Protime-INR     Status: Abnormal   Collection Time: 09/23/14 11:30 PM  Result Value Ref Range   Prothrombin Time 22.8 (H) 11.6 - 15.2  seconds   INR 1.99 (H) 0.00 - 1.49  Creatinine, urine, random     Status: None   Collection Time: 09/24/14  1:44 AM  Result Value Ref Range   Creatinine, Urine 85.51 mg/dL  Sodium, urine, random     Status: None   Collection Time: 09/24/14  1:44 AM  Result Value Ref Range   Sodium, Ur 87 mmol/L  Urine rapid drug screen (hosp performed)     Status: Abnormal   Collection Time: 09/24/14  1:44 AM  Result Value Ref Range   Opiates POSITIVE (A) NONE DETECTED   Cocaine NONE DETECTED NONE DETECTED   Benzodiazepines NONE DETECTED NONE DETECTED   Amphetamines NONE DETECTED NONE DETECTED   Tetrahydrocannabinol NONE DETECTED NONE DETECTED   Barbiturates NONE DETECTED NONE DETECTED    Comment:        DRUG SCREEN FOR MEDICAL PURPOSES ONLY.  IF CONFIRMATION IS NEEDED FOR ANY PURPOSE, NOTIFY LAB WITHIN 5 DAYS.        LOWEST DETECTABLE LIMITS FOR URINE DRUG  SCREEN Drug Class       Cutoff (ng/mL) Amphetamine      1000 Barbiturate      200 Benzodiazepine   299 Tricyclics       371 Opiates          300 Cocaine          300 THC              50   Comprehensive metabolic panel     Status: Abnormal   Collection Time: 09/24/14  4:53 AM  Result Value Ref Range   Sodium 139 135 - 145 mmol/L   Potassium 3.6 3.5 - 5.1 mmol/L   Chloride 110 96 - 112 mmol/L   CO2 25 19 - 32 mmol/L   Glucose, Bld 179 (H) 70 - 99 mg/dL   BUN 15 6 - 23 mg/dL   Creatinine, Ser 2.20 (H) 0.50 - 1.35 mg/dL   Calcium 7.5 (L) 8.4 - 10.5 mg/dL   Total Protein 4.0 (L) 6.0 - 8.3 g/dL   Albumin 1.6 (L) 3.5 - 5.2 g/dL   AST 26 0 - 37 U/L   ALT 20 0 - 53 U/L   Alkaline Phosphatase 49 39 - 117 U/L   Total Bilirubin 0.4 0.3 - 1.2 mg/dL   GFR calc non Af Amer 31 (L) >90 mL/min   GFR calc Af Amer 36 (L) >90 mL/min    Comment: (NOTE) The eGFR has been calculated using the CKD EPI equation. This calculation has not been validated in all clinical situations. eGFR's persistently <90 mL/min signify possible Chronic  Kidney Disease.    Anion gap 4 (L) 5 - 15  CBC     Status: Abnormal   Collection Time: 09/24/14  4:53 AM  Result Value Ref Range   WBC 8.7 4.0 - 10.5 K/uL   RBC 4.13 (L) 4.22 - 5.81 MIL/uL   Hemoglobin 12.1 (L) 13.0 - 17.0 g/dL   HCT 34.9 (L) 39.0 - 52.0 %   MCV 84.5 78.0 - 100.0 fL   MCH 29.3 26.0 - 34.0 pg   MCHC 34.7 30.0 - 36.0 g/dL   RDW 15.1 11.5 - 15.5 %   Platelets 186 150 - 400 K/uL  Protime-INR     Status: Abnormal   Collection Time: 09/24/14  4:53 AM  Result Value Ref Range   Prothrombin Time 23.8 (H) 11.6 - 15.2 seconds   INR 2.11 (H) 0.00 - 1.49  Glucose, capillary     Status: Abnormal   Collection Time: 09/24/14  7:53 AM  Result Value Ref Range   Glucose-Capillary 107 (H) 70 - 99 mg/dL  Heparin level (unfractionated)     Status: Abnormal   Collection Time: 09/24/14  8:10 AM  Result Value Ref Range   Heparin Unfractionated <0.10 (L) 0.30 - 0.70 IU/mL    Comment:        IF HEPARIN RESULTS ARE BELOW EXPECTED VALUES, AND PATIENT DOSAGE HAS BEEN CONFIRMED, SUGGEST FOLLOW UP TESTING OF ANTITHROMBIN III LEVELS. SPECIMEN CHECKED FOR CLOTS REPEATED TO VERIFY      HPI :59 y.o. male with past medical history of diabetes mellitus, COPD, asthma, rheumatoid arthritis, hepatitis C, bipolar disorder, history of melanoma, history of pancreatitis, PAF on Coumadin, hypertension, hyperlipidemia, hypothyroidism, who presents with left second toe pain  Patient has a chronic diabetic neuropathy with leg pain chronically. Patient reports that he noticed pain and callus over his left second toe for 3 to 4 weeks. He said he has  a left leg pain and swelling, for which he had 3 venous Doppler in the past 7 weeks. No DVT was found in his leg by venous Doppler. Patient denies fever, chills, headaches, cough, chest pain, SOB, abdominal pain, diarrhea, constipation, dysuria, urgency, frequency, hematuria, skin rashes. No unilateral weakness, numbness or tingling sensations. No vision change  or hearing loss.  In ED, patient was found to have osteomyelitis by X-Jaquarius of left foot. WBC 10.8. No tachycardia, temperature 98.3. Creatinine up from recent 1.09 on 12/25/13-2.16, BUN 16. Patient is admitted to inpatient for further evaluation and treatment. Orthopedic surgeon was consulted by ED.  HOSPITAL COURSE:  Patient seen by orthopedics Dr. Sharol Given prior to discharge. Dr. Sharol Given told him that the patient osteomyelitis will require a second Kamrin amputation of the second toe. It should be safe to wait until patient is ready to proceed with surgery. Patient may be discharged home on oral antibiotics . Follow-up in the office on Monday. Patient does not need to change his Coumadin dosage for his atrial fibrillation. We will plan for surgery with patient continuing his Coumadin. Patient found to be in acute renal failure with a creatinine that was 1.09 posterior, now 2.20 INR 2.11 prior to discharge Patient refuses to do a renal ultrasound, refused to stay in the hospital to be monitored for his renal function. Very agitated and aggravated Initially refusing to sign AMA papers, and then finally signed the papers when he received his antibiotic prescriptions  Would recommend checking INR, CBC, CMP beforeproceeding with surgery   Discharge Exam:  Blood pressure 148/89, pulse 54, temperature 97.7 F (36.5 C), temperature source Oral, resp. rate 18, height $RemoveBe'6\' 9"'ZUQcnALoz$  (2.057 m), weight 136.76 kg (301 lb 8 oz), SpO2 97 %.   Not examined as the patient left AMA        Follow-up Information    Follow up with Newt Minion, MD.   Specialty:  Orthopedic Surgery   Why:  Call office to make follow-up appointment on Monday.   Contact information:   [place] [address] [phone]       Signed: Reyne Dumas 09/24/2014, 12:12 PM

## 2014-09-24 NOTE — Progress Notes (Signed)
Pt left AMA, insisting that he must pay bills or he will become homeless. Dr Allyson Sabal spoke to him, but pt insisted to leave. Pt agreed to sign the AMA form to receive prescriptions. Pt plans to return to the hospital on Monday for surgery. He was told that he will need to begin again at the Emergency Dept when he returns.

## 2014-09-24 NOTE — Progress Notes (Signed)
OT Cancellation Note  Patient Details Name: Johnny Sims MRN: [id number] DOB: [birth year]   Cancelled Treatment:    Reason Eval/Treat Not Completed: Other (comment). Spoke with pt for a little while about possibly getting a shower chair and role of OT, but nurse came to get pt for test and pt hadn't gotten up with OT yet. Was planning to go back to see pt, but according to nurse, pt left AMA.  Benito Mccreedy OTR/L [phone] 09/24/2014, 10:13 AM

## 2014-09-25 ENCOUNTER — Telehealth: Payer: Self-pay | Admitting: Internal Medicine

## 2014-09-25 ENCOUNTER — Encounter (HOSPITAL_COMMUNITY): Payer: Self-pay | Admitting: *Deleted

## 2014-09-25 ENCOUNTER — Inpatient Hospital Stay (HOSPITAL_COMMUNITY)
Admission: EM | Admit: 2014-09-25 | Discharge: 2014-09-26 | DRG: 540 | Discharge status: Against Medical Advice | Payer: Medicaid Other | Attending: Internal Medicine | Admitting: Internal Medicine

## 2014-09-25 DIAGNOSIS — E119 Type 2 diabetes mellitus without complications: Secondary | ICD-10-CM | POA: Diagnosis present

## 2014-09-25 DIAGNOSIS — R000 Tachycardia, unspecified: Secondary | ICD-10-CM | POA: Diagnosis present

## 2014-09-25 DIAGNOSIS — M86179 Other acute osteomyelitis, unspecified ankle and foot: Principal | ICD-10-CM | POA: Diagnosis present

## 2014-09-25 DIAGNOSIS — E039 Hypothyroidism, unspecified: Secondary | ICD-10-CM | POA: Diagnosis present

## 2014-09-25 DIAGNOSIS — F17210 Nicotine dependence, cigarettes, uncomplicated: Secondary | ICD-10-CM | POA: Diagnosis present

## 2014-09-25 DIAGNOSIS — Z79899 Other long term (current) drug therapy: Secondary | ICD-10-CM

## 2014-09-25 DIAGNOSIS — Z7901 Long term (current) use of anticoagulants: Secondary | ICD-10-CM

## 2014-09-25 DIAGNOSIS — M1990 Unspecified osteoarthritis, unspecified site: Secondary | ICD-10-CM | POA: Diagnosis present

## 2014-09-25 DIAGNOSIS — N183 Chronic kidney disease, stage 3 unspecified: Secondary | ICD-10-CM | POA: Diagnosis present

## 2014-09-25 DIAGNOSIS — M797 Fibromyalgia: Secondary | ICD-10-CM | POA: Diagnosis present

## 2014-09-25 DIAGNOSIS — Z794 Long term (current) use of insulin: Secondary | ICD-10-CM | POA: Diagnosis not present

## 2014-09-25 DIAGNOSIS — N179 Acute kidney failure, unspecified: Secondary | ICD-10-CM | POA: Diagnosis not present

## 2014-09-25 DIAGNOSIS — M86172 Other acute osteomyelitis, left ankle and foot: Secondary | ICD-10-CM

## 2014-09-25 DIAGNOSIS — J449 Chronic obstructive pulmonary disease, unspecified: Secondary | ICD-10-CM | POA: Diagnosis present

## 2014-09-25 DIAGNOSIS — G8929 Other chronic pain: Secondary | ICD-10-CM | POA: Diagnosis present

## 2014-09-25 DIAGNOSIS — F319 Bipolar disorder, unspecified: Secondary | ICD-10-CM | POA: Diagnosis present

## 2014-09-25 DIAGNOSIS — D649 Anemia, unspecified: Secondary | ICD-10-CM | POA: Diagnosis present

## 2014-09-25 DIAGNOSIS — E118 Type 2 diabetes mellitus with unspecified complications: Secondary | ICD-10-CM | POA: Diagnosis not present

## 2014-09-25 DIAGNOSIS — E785 Hyperlipidemia, unspecified: Secondary | ICD-10-CM | POA: Diagnosis present

## 2014-09-25 DIAGNOSIS — E46 Unspecified protein-calorie malnutrition: Secondary | ICD-10-CM | POA: Diagnosis not present

## 2014-09-25 DIAGNOSIS — M069 Rheumatoid arthritis, unspecified: Secondary | ICD-10-CM | POA: Diagnosis present

## 2014-09-25 DIAGNOSIS — I4891 Unspecified atrial fibrillation: Secondary | ICD-10-CM | POA: Diagnosis present

## 2014-09-25 DIAGNOSIS — B1920 Unspecified viral hepatitis C without hepatic coma: Secondary | ICD-10-CM | POA: Diagnosis present

## 2014-09-25 DIAGNOSIS — I480 Paroxysmal atrial fibrillation: Secondary | ICD-10-CM | POA: Diagnosis not present

## 2014-09-25 LAB — BASIC METABOLIC PANEL
ANION GAP: 10 (ref 5–15)
BUN: 27 mg/dL — ABNORMAL HIGH (ref 6–23)
CHLORIDE: 107 mmol/L (ref 96–112)
CO2: 20 mmol/L (ref 19–32)
CREATININE: 3.02 mg/dL — AB (ref 0.50–1.35)
Calcium: 7.9 mg/dL — ABNORMAL LOW (ref 8.4–10.5)
GFR calc Af Amer: 25 mL/min — ABNORMAL LOW (ref >90)
GFR calc non Af Amer: 21 mL/min — ABNORMAL LOW (ref >90)
GLUCOSE: 94 mg/dL (ref 70–99)
POTASSIUM: 3.5 mmol/L (ref 3.5–5.1)
Sodium: 137 mmol/L (ref 135–145)

## 2014-09-25 LAB — PROTIME-INR
INR: 2.08 — ABNORMAL HIGH (ref 0.00–1.49)
Prothrombin Time: 23.6 seconds — ABNORMAL HIGH (ref 11.6–15.2)

## 2014-09-25 LAB — CBC WITH DIFFERENTIAL/PLATELET
BASOS PCT: 0 % (ref 0–1)
Basophils Absolute: 0 10*3/uL (ref 0.0–0.1)
EOS ABS: 0.2 10*3/uL (ref 0.0–0.7)
EOS PCT: 2 % (ref 0–5)
HCT: 38 % — ABNORMAL LOW (ref 39.0–52.0)
Hemoglobin: 12.8 g/dL — ABNORMAL LOW (ref 13.0–17.0)
Lymphocytes Relative: 27 % (ref 12–46)
Lymphs Abs: 2.8 10*3/uL (ref 0.7–4.0)
MCH: 28.4 pg (ref 26.0–34.0)
MCHC: 33.7 g/dL (ref 30.0–36.0)
MCV: 84.3 fL (ref 78.0–100.0)
Monocytes Absolute: 0.9 10*3/uL (ref 0.1–1.0)
Monocytes Relative: 9 % (ref 3–12)
Neutro Abs: 6.4 10*3/uL (ref 1.7–7.7)
Neutrophils Relative %: 62 % (ref 43–77)
Platelets: 213 10*3/uL (ref 150–400)
RBC: 4.51 MIL/uL (ref 4.22–5.81)
RDW: 15.2 % (ref 11.5–15.5)
WBC: 10.4 10*3/uL (ref 4.0–10.5)

## 2014-09-25 LAB — I-STAT CG4 LACTIC ACID, ED: LACTIC ACID, VENOUS: 0.69 mmol/L (ref 0.5–2.0)

## 2014-09-25 LAB — I-STAT TROPONIN, ED: Troponin i, poc: 0 ng/mL (ref 0.00–0.08)

## 2014-09-25 LAB — HEPATITIS C GENOTYPE

## 2014-09-25 MED ORDER — HYDROMORPHONE HCL 1 MG/ML IJ SOLN
1.0000 mg | INTRAMUSCULAR | Status: DC | PRN
  Administered 2014-09-25 – 2014-09-26 (×4): 1 mg via INTRAVENOUS
  Filled 2014-09-25 (×4): qty 1

## 2014-09-25 MED ORDER — LEVOTHYROXINE SODIUM 88 MCG PO TABS
88.0000 ug | ORAL_TABLET | Freq: Every day | ORAL | Status: DC
  Administered 2014-09-26: 88 ug via ORAL
  Filled 2014-09-25 (×2): qty 1

## 2014-09-25 MED ORDER — MORPHINE SULFATE 4 MG/ML IJ SOLN
4.0000 mg | Freq: Once | INTRAMUSCULAR | Status: AC
  Administered 2014-09-25: 4 mg via INTRAVENOUS

## 2014-09-25 MED ORDER — SODIUM CHLORIDE 0.9 % IV BOLUS (SEPSIS)
1000.0000 mL | Freq: Once | INTRAVENOUS | Status: AC
  Administered 2014-09-25: 1000 mL via INTRAVENOUS

## 2014-09-25 MED ORDER — METOPROLOL TARTRATE 1 MG/ML IV SOLN: 5.0000 mg | Freq: Once | INTRAVENOUS | Status: DC

## 2014-09-25 MED ORDER — ADULT MULTIVITAMIN W/MINERALS CH
1.0000 | ORAL_TABLET | Freq: Every morning | ORAL | Status: DC
  Administered 2014-09-26: 1 via ORAL
  Filled 2014-09-25: qty 1

## 2014-09-25 MED ORDER — AMLODIPINE BESYLATE 5 MG PO TABS
5.0000 mg | ORAL_TABLET | Freq: Every day | ORAL | Status: DC
  Filled 2014-09-25: qty 1

## 2014-09-25 MED ORDER — LORATADINE 10 MG PO TABS
10.0000 mg | ORAL_TABLET | Freq: Every day | ORAL | Status: DC | PRN
  Filled 2014-09-25: qty 1

## 2014-09-25 MED ORDER — METOPROLOL TARTRATE 50 MG PO TABS
50.0000 mg | ORAL_TABLET | Freq: Every day | ORAL | Status: DC
  Administered 2014-09-26: 50 mg via ORAL
  Filled 2014-09-25: qty 1

## 2014-09-25 MED ORDER — OXYCODONE-ACETAMINOPHEN 5-325 MG PO TABS: 1.0000 | ORAL_TABLET | Freq: Once | ORAL | Status: DC

## 2014-09-25 MED ORDER — DIAZEPAM 5 MG PO TABS
10.0000 mg | ORAL_TABLET | Freq: Three times a day (TID) | ORAL | Status: DC
  Administered 2014-09-25 – 2014-09-26 (×2): 10 mg via ORAL
  Filled 2014-09-25 (×2): qty 2

## 2014-09-25 MED ORDER — VITAMIN D3 25 MCG (1000 UNIT) PO TABS
2000.0000 [IU] | ORAL_TABLET | Freq: Every evening | ORAL | Status: DC
  Administered 2014-09-25: 2000 [IU] via ORAL
  Filled 2014-09-25 (×2): qty 2

## 2014-09-25 MED ORDER — SIMVASTATIN 20 MG PO TABS
20.0000 mg | ORAL_TABLET | Freq: Every evening | ORAL | Status: DC
  Administered 2014-09-25: 20 mg via ORAL
  Filled 2014-09-25 (×2): qty 1

## 2014-09-25 MED ORDER — SODIUM CHLORIDE 0.9 % IV SOLN
INTRAVENOUS | Status: AC
  Administered 2014-09-25: 23:00:00 via INTRAVENOUS

## 2014-09-25 MED ORDER — PANTOPRAZOLE SODIUM 40 MG PO TBEC
40.0000 mg | DELAYED_RELEASE_TABLET | Freq: Every day | ORAL | Status: DC
  Administered 2014-09-26: 40 mg via ORAL

## 2014-09-25 MED ORDER — TIOTROPIUM BROMIDE MONOHYDRATE 18 MCG IN CAPS
18.0000 ug | ORAL_CAPSULE | Freq: Every day | RESPIRATORY_TRACT | Status: DC
  Filled 2014-09-25 (×2): qty 5

## 2014-09-25 MED ORDER — INSULIN ASPART 100 UNIT/ML ~~LOC~~ SOLN
0.0000 [IU] | Freq: Three times a day (TID) | SUBCUTANEOUS | Status: DC
  Administered 2014-09-26: 2 [IU] via SUBCUTANEOUS

## 2014-09-25 MED ORDER — FLUTICASONE PROPIONATE 50 MCG/ACT NA SUSP
1.0000 | Freq: Every day | NASAL | Status: DC | PRN
  Filled 2014-09-25: qty 16

## 2014-09-25 MED ORDER — SODIUM CHLORIDE 0.9 % IJ SOLN
3.0000 mL | Freq: Two times a day (BID) | INTRAMUSCULAR | Status: DC
  Administered 2014-09-25 – 2014-09-26 (×2): 3 mL via INTRAVENOUS

## 2014-09-25 MED ORDER — NICOTINE 21 MG/24HR TD PT24
21.0000 mg | MEDICATED_PATCH | TRANSDERMAL | Status: DC
  Administered 2014-09-25: 21 mg via TRANSDERMAL
  Filled 2014-09-25 (×2): qty 1

## 2014-09-25 MED ORDER — INSULIN ASPART 100 UNIT/ML ~~LOC~~ SOLN: 0.0000 [IU] | Freq: Every day | SUBCUTANEOUS | Status: DC

## 2014-09-25 MED ORDER — MORPHINE SULFATE 4 MG/ML IJ SOLN
4.0000 mg | Freq: Once | INTRAMUSCULAR | Status: AC
  Administered 2014-09-25: 4 mg via INTRAVENOUS
  Filled 2014-09-25: qty 1

## 2014-09-25 MED ORDER — WARFARIN - PHARMACIST DOSING INPATIENT: Freq: Every day | Status: DC

## 2014-09-25 NOTE — Progress Notes (Signed)
ANTICOAGULATION CONSULT NOTE - Initial Consult  Pharmacy Consult for Warfarin Indication: atrial fibrillation  Allergies  Allergen Reactions  . Oxycodone Hcl Other (See Comments)    Makes hyper  . Antihistamines, Chlorpheniramine-Type Other (See Comments)    unknown  . Iodinated Diagnostic Agents Other (See Comments)    Patient states he doesn't know what iodine is and doesn't think he is allergic to it despite it being listed with his allergies  . Lidocaine Itching    Patient is uncertain of this allergy  . Methadone Other (See Comments)    "I won't take it anymore"  . Other Hives    Steroid creams  . Pentazocine Lactate Other (See Comments)    unknown  . Sulfa Antibiotics Itching and Other (See Comments)    whelps  . Vicodin [Hydrocodone-Acetaminophen] Palpitations and Other (See Comments)    Doesn't want to take    Patient Measurements:   Heparin Dosing Weight:   Vital Signs: Temp: 98.5 F (36.9 C) (03/31 2226) Temp Source: Oral (03/31 2226) BP: 161/125 mmHg (03/31 2226) Pulse Rate: 120 (03/31 2226)  Labs:  Recent Labs  09/23/14 1852 09/23/14 2330 09/24/14 0453 09/24/14 0810 09/25/14 1926  HGB 14.0  --  12.1*  --  12.8*  HCT 39.5  --  34.9*  --  38.0*  PLT 219  --  186  --  213  APTT  --  37  --   --   --   LABPROT  --  22.8* 23.8*  --  23.6*  INR  --  1.99* 2.11*  --  2.08*  HEPARINUNFRC  --   --   --  <0.10*  --   CREATININE 2.15*  --  2.20*  --  3.02*    Estimated Creatinine Clearance: 42.9 mL/min (by C-G formula based on Cr of 3.02).   Medical History: Past Medical History  Diagnosis Date  . PAF (paroxysmal atrial fibrillation)   . Diabetes mellitus   . Hypothyroidism   . Hyperlipidemia   . Hypertension   . Chronic pain   . OA (osteoarthritis)   . COPD (chronic obstructive pulmonary disease)   . RA (rheumatoid arthritis)   . Hepatitis C   . Venous stasis   . Bipolar 1 disorder   . Hepatitis C   . Fibromyalgia   . Dysrhythmia     PAF   . Asthma   . Anxiety   . Melanoma     face, shoulder arm  . Pancreatitis, acute 2015    Medications:  Prescriptions prior to admission  Medication Sig Dispense Refill Last Dose  . amLODipine (NORVASC) 5 MG tablet Take 5 mg by mouth daily.   Past Week at Unknown time  . amoxicillin-clavulanate (AUGMENTIN) 875-125 MG per tablet Take 1 tablet by mouth 2 (two) times daily. 20 tablet 0 09/25/2014 at Unknown time  . cholecalciferol (VITAMIN D) 1000 UNITS tablet Take 2,000 Units by mouth every evening.    09/24/2014 at Unknown time  . COUMADIN 10 MG tablet Take 1 tablet (10 mg total) by mouth as directed. (Patient taking differently: Take 10 mg by mouth as directed. Monday, Wednesday, Friday and Saturday - alternating with 7.5mg ) 30 tablet 1 09/24/2014 at Unknown time  . COUMADIN 7.5 MG tablet Take 1 tablet (7.5 mg total) by mouth as directed. (Patient taking differently: Take 7.5 mg by mouth as directed. Sunday, Tuesday, Thursday - alternating with 10mg ) 30 tablet 1 09/25/2014 at Unknown time  . diazepam (VALIUM) 10  MG tablet Take 10 mg by mouth 3 (three) times daily. For anxiety   09/25/2014 at Unknown time  . doxycycline (VIBRAMYCIN) 50 MG capsule Take 2 capsules (100 mg total) by mouth 2 (two) times daily. 20 capsule 0 09/25/2014 at Unknown time  . fluticasone (FLONASE) 50 MCG/ACT nasal spray Place 1 spray into both nostrils daily as needed for allergies.    Past Month at Unknown time  . insulin glargine (LANTUS) 100 unit/mL SOPN Inject 0.27 mLs (27 Units total) into the skin at bedtime. PEN 15 mL 0 09/24/2014 at Unknown time  . levothyroxine (SYNTHROID, LEVOTHROID) 88 MCG tablet Take 1 tablet (88 mcg total) by mouth daily. 30 tablet 3 09/25/2014 at Unknown time  . lisinopril (PRINIVIL,ZESTRIL) 40 MG tablet Take 40 mg by mouth every morning.   0 09/25/2014 at Unknown time  . loratadine (CLARITIN) 10 MG tablet Take 1 tablet (10 mg total) by mouth daily as needed for allergies. 30 tablet 0 Past Month at  Unknown time  . metoprolol (LOPRESSOR) 50 MG tablet Take 1 tablet (50 mg total) by mouth daily. 30 tablet 5 Past Week at 1000  . Multiple Vitamin (MULTIVITAMIN) tablet Take 1 tablet by mouth every morning.    09/25/2014 at Unknown time  . omeprazole (PRILOSEC) 40 MG capsule Take 1 capsule (40 mg total) by mouth daily. 30 capsule 5 09/25/2014 at Unknown time  . simvastatin (ZOCOR) 20 MG tablet Take 1 tablet (20 mg total) by mouth every evening. 30 tablet 0 Past Week at Unknown time  . tiotropium (SPIRIVA) 18 MCG inhalation capsule Place 1 capsule (18 mcg total) into inhaler and inhale daily. 30 capsule 0 09/25/2014 at Unknown time  . Insulin Pen Needle (SURE COMFORT PEN NEEDLES) 31G X 8 MM MISC Use insulin once nightly for E11.9 100 each 4 09/23/2014 at Unknown time  . nicotine (NICODERM CQ - DOSED IN MG/24 HOURS) 21 mg/24hr patch Place 1 patch (21 mg total) onto the skin daily. 28 patch 0 not started yet  . oxyCODONE (ROXICODONE) 30 MG immediate release tablet Take 1 tablet (30 mg total) by mouth 5 (five) times daily. 35 tablet 0 Past Month at Unknown time  . simvastatin (ZOCOR) 20 MG tablet Take 1 tablet (20 mg total) by mouth daily. 30 tablet 5 09/23/2014 at Unknown time   Scheduled:  . [START ON 09/26/2014] amLODipine  5 mg Oral Daily  . cholecalciferol  2,000 Units Oral QPM  . diazepam  10 mg Oral TID  . insulin aspart  0-5 Units Subcutaneous QHS  . [START ON 09/26/2014] insulin aspart  0-9 Units Subcutaneous TID WC  . [START ON 09/26/2014] levothyroxine  88 mcg Oral QAC breakfast  . [START ON 09/26/2014] metoprolol  50 mg Oral Daily  . [START ON 09/26/2014] multivitamin with minerals  1 tablet Oral q morning - 10a  . oxyCODONE-acetaminophen  1 tablet Oral Once  . [START ON 09/26/2014] pantoprazole  40 mg Oral Daily  . simvastatin  20 mg Oral QPM  . sodium chloride  3 mL Intravenous Q12H  . [START ON 09/26/2014] tiotropium  18 mcg Inhalation Daily   Infusions:  . sodium chloride 100 mL/hr at 09/25/14  2247   Assessment: 59yo male with history of Afib presents with L foot pain and ARF. Pharmacy is consulted to dose warfarin for atrial fibrillation. INR is therapeutic at 2.08 on presentation, Hgb 12.8, Plt 213, sCr 3.02.  PTA warfarin dose: 10mg  MWFSat, 7.5mg  TTSun. Last dose was 09/25/14  Goal of Therapy:  INR 2-3 Monitor platelets by anticoagulation protocol: Yes   Plan:  Hold warfarin tonight Daily INR/CBC Monitor s/sx of bleeding F/u ortho plans  Andrey Cota. Diona Foley, PharmD Clinical Pharmacist Pager [phone] 09/25/2014,10:49 PM

## 2014-09-25 NOTE — ED Notes (Signed)
Attempted report 

## 2014-09-25 NOTE — Telephone Encounter (Signed)
Patient would like to speak to nurse about his current care, he has several requests for the nurse, please f/u with pt.

## 2014-09-25 NOTE — ED Notes (Signed)
Pt c/o bilateral leg pain. Pt states he walked to the [facility] to get his antibiotics. Pt states while he was walking he had a sudden onset of bilateral leg soreness. Pt reports leaving AMA from hospital yesterday.

## 2014-09-25 NOTE — ED Provider Notes (Signed)
CSN: [id number]     Arrival date & time 09/25/14  1744 History   First MD Initiated Contact with Patient 09/25/14 1849     Chief Complaint  Patient presents with  . Leg Pain     (Consider location/radiation/quality/duration/timing/severity/associated sxs/prior Treatment) HPI   This is a 59 year old male with a past medical history of hepatitis C, osteomyelitis of his left foot, previous melanoma, diabetes, hyperlipidemia, hypertension.  Comes in today because of bilateral lower extremity pain. He was just admitted for hospitalization for the osteomyelitis on his left toe, but during the hospitalization he was noted to have worsening renal function. He ended up leaving AMA from that hospitalization because they recommended that he stay for further evaluation of his kidney function. Currently has no complaints other than bilateral lower extremity pain which he said he's had for years, both legs, symmetric, no fevers.  Past Medical History  Diagnosis Date  . PAF (paroxysmal atrial fibrillation)   . Diabetes mellitus   . Hypothyroidism   . Hyperlipidemia   . Hypertension   . Chronic pain   . OA (osteoarthritis)   . COPD (chronic obstructive pulmonary disease)   . RA (rheumatoid arthritis)   . Hepatitis C   . Venous stasis   . Bipolar 1 disorder   . Hepatitis C   . Fibromyalgia   . Dysrhythmia     PAF  . Asthma   . Anxiety   . Melanoma     face, shoulder arm  . Pancreatitis, acute 2015   Past Surgical History  Procedure Laterality Date  . Thyroidectomy    . Tonsillectomy    . US echocardiography  02/19/2010    EF 55-60%  . Skin cancer excision      melonoma  . Fracture surgery Left     arm  . Submandibular gland excision Left 12/25/2013    w/sialolithotomy  . Inguinal hernia repair Bilateral   . Hernia repair      umbicial  . Knee surgery      /H&P 02/20/2010  . Submandibular gland excision Left 12/25/2013    Procedure: SUBMANDIBULAR GLAND STONE AND CHRONIC  SIALOLITHIASIS EXCISION;  Surgeon: Melida Quitter, MD;  Location: [facility] OR;  Service: ENT;  Laterality: Left;   Family History  Problem Relation Age of Onset  . Hypertension Mother   . Alzheimer's disease Mother   . Heart disease Father    History  Substance Use Topics  . Smoking status: Current Every Day Smoker -- 1.00 packs/day for 43 years  . Smokeless tobacco: Not on file  . Alcohol Use: 0.6 oz/week    1 Cans of beer per week    Review of Systems  Constitutional: Negative for fever and chills.  Eyes: Negative for redness.  Respiratory: Negative for cough and shortness of breath.   Cardiovascular: Negative for chest pain.  Gastrointestinal: Negative for nausea, vomiting, abdominal pain and diarrhea.  Genitourinary: Negative for dysuria.  Musculoskeletal:       Bilateral lower extremity pain  Skin: Negative for rash.  Neurological: Negative for headaches.  All other systems reviewed and are negative.     Allergies  Oxycodone hcl; Antihistamines, chlorpheniramine-type; Iodinated diagnostic agents; Lidocaine; Methadone; Other; Pentazocine lactate; Sulfa antibiotics; and Vicodin  Home Medications   Prior to Admission medications   Medication Sig Start Date End Date Taking? Authorizing Provider  amLODipine (NORVASC) 5 MG tablet Take 5 mg by mouth daily.   Yes Historical Provider, MD  amoxicillin-clavulanate (AUGMENTIN) 875-125 MG per  tablet Take 1 tablet by mouth 2 (two) times daily. 09/24/14  Yes Reyne Dumas, MD  cholecalciferol (VITAMIN D) 1000 UNITS tablet Take 2,000 Units by mouth every evening.    Yes Historical Provider, MD  COUMADIN 10 MG tablet Take 1 tablet (10 mg total) by mouth as directed. Patient taking differently: Take 10 mg by mouth as directed. Monday, Wednesday, Friday and Saturday - alternating with 7.5mg  08/20/14  Yes Thayer Headings, MD  COUMADIN 7.5 MG tablet Take 1 tablet (7.5 mg total) by mouth as directed. Patient taking differently: Take 7.5 mg by mouth  as directed. Sunday, Tuesday, Thursday - alternating with 10mg  08/20/14  Yes Thayer Headings, MD  diazepam (VALIUM) 10 MG tablet Take 10 mg by mouth 3 (three) times daily. For anxiety   Yes Historical Provider, MD  doxycycline (VIBRAMYCIN) 50 MG capsule Take 2 capsules (100 mg total) by mouth 2 (two) times daily. 09/24/14  Yes Reyne Dumas, MD  fluticasone (FLONASE) 50 MCG/ACT nasal spray Place 1 spray into both nostrils daily as needed for allergies.    Yes Historical Provider, MD  insulin glargine (LANTUS) 100 unit/mL SOPN Inject 0.27 mLs (27 Units total) into the skin at bedtime. PEN 08/16/14  Yes Britt Bottom, NP  levothyroxine (SYNTHROID, LEVOTHROID) 88 MCG tablet Take 1 tablet (88 mcg total) by mouth daily. 08/28/14  Yes Lance Bosch, NP  lisinopril (PRINIVIL,ZESTRIL) 40 MG tablet Take 40 mg by mouth daily. 07/25/14  Yes Historical Provider, MD  loratadine (CLARITIN) 10 MG tablet Take 1 tablet (10 mg total) by mouth daily as needed for allergies. 08/16/14  Yes Britt Bottom, NP  metoprolol (LOPRESSOR) 50 MG tablet Take 1 tablet (50 mg total) by mouth daily. 08/28/14  Yes Lance Bosch, NP  Multiple Vitamin (MULTIVITAMIN) tablet Take 1 tablet by mouth every morning.    Yes Historical Provider, MD  omeprazole (PRILOSEC) 40 MG capsule Take 1 capsule (40 mg total) by mouth daily. 08/28/14  Yes Lance Bosch, NP  oxycodone (ROXICODONE) 30 MG immediate release tablet Take 30 mg by mouth 3 (three) times daily. Take 2 tablets in the morning,1 tablet at lunch and 2 tablets in the afternoon. 07/17/12  Yes Thayer Headings, MD  simvastatin (ZOCOR) 20 MG tablet Take 1 tablet (20 mg total) by mouth every evening. 08/16/14  Yes Britt Bottom, NP  tiotropium (SPIRIVA) 18 MCG inhalation capsule Place 1 capsule (18 mcg total) into inhaler and inhale daily. 08/16/14  Yes Britt Bottom, NP  Insulin Pen Needle (SURE COMFORT PEN NEEDLES) 31G X 8 MM MISC Use insulin once nightly for E11.9 08/28/14   Lance Bosch, NP  methocarbamol (ROBAXIN) 500 MG tablet Take 500 mg by mouth at bedtime as needed for muscle spasms.     Historical Provider, MD  nicotine (NICODERM CQ - DOSED IN MG/24 HOURS) 21 mg/24hr patch Place 1 patch (21 mg total) onto the skin daily. 09/24/14   Reyne Dumas, MD  oxyCODONE (ROXICODONE) 30 MG immediate release tablet Take 1 tablet (30 mg total) by mouth 5 (five) times daily. 12/26/13   Melida Quitter, MD  promethazine (PHENERGAN) 25 MG suppository Place 25 mg rectally every 6 (six) hours as needed for nausea or vomiting.    Historical Provider, MD  simvastatin (ZOCOR) 20 MG tablet Take 1 tablet (20 mg total) by mouth daily. 08/28/14   Lance Bosch, NP   BP 135/68 mmHg  Pulse 105  Temp(Src) 98.6 F (37 C) (Oral)  Resp  16  SpO2 94% Physical Exam  Constitutional: He is oriented to person, place, and time. No distress.  HENT:  Head: Normocephalic and atraumatic.  Eyes: EOM are normal. Pupils are equal, round, and reactive to light.  Neck: Normal range of motion. Neck supple.  Cardiovascular: Normal rate.   Pulmonary/Chest: Effort normal. No respiratory distress.  Abdominal: Soft. There is no tenderness.  Musculoskeletal: Normal range of motion.  Stasis changes on the bilateral lower extremities without evidence of cellulitis  Symmetric swelling  Neurological: He is alert and oriented to person, place, and time.  Skin: No rash noted. He is not diaphoretic.  Psychiatric: He has a normal mood and affect.    ED Course  Procedures (including critical care time) Labs Review Labs Reviewed  CBC WITH DIFFERENTIAL/PLATELET - Abnormal; Notable for the following:    Hemoglobin 12.8 (*)    HCT 38.0 (*)    All other components within normal limits  BASIC METABOLIC PANEL - Abnormal; Notable for the following:    BUN 27 (*)    Creatinine, Ser 3.02 (*)    Calcium 7.9 (*)    GFR calc non Af Amer 21 (*)    GFR calc Af Amer 25 (*)    All other components within normal limits   PROTIME-INR - Abnormal; Notable for the following:    Prothrombin Time 23.6 (*)    INR 2.08 (*)    All other components within normal limits  I-STAT CG4 LACTIC ACID, ED  I-STAT TROPOININ, ED    Imaging Review No results found.   EKG Interpretation   Date/Time:  Thursday September 25 2014 19:27:07 EDT Ventricular Rate:  105 PR Interval:  196 QRS Duration: 93 QT Interval:  345 QTC Calculation: 456 R Axis:   56 Text Interpretation:  Sinus tachycardia Atrial premature complex Baseline  wander in lead(s) V3 Confirmed by BEATON  MD, ROBERT ([id number]) on 09/25/2014  7:29:48 PM      MDM   Final diagnoses:  AKI (acute kidney injury)    This is a 59 year old male with a past medical history of hepatitis C, osteomyelitis of his left foot, previous melanoma, diabetes, hyperlipidemia, hypertension.  Comes in today because of bilateral lower extremity pain  Just left the hospital from hospitalization for osteomyelitis of his left foot  He left the hospital AMA that time with worsening renal function noted by medicine.  Comes in today with lower extremity pain which is chronic. No evidence of cellulitis, they're symmetric, no evidence of DVT. I have obtained basic labs BMP shows worsening of his renal function from his hospitalization and discharge yesterday. Will admit for acute on chronic kidney injury  Patient admitted to the hospitalist service.   Jarome Matin, MD 09/25/14 2035  Leonard Schwartz, MD 10/07/14 [phone]

## 2014-09-25 NOTE — H&P (Addendum)
Johnny Sims is an 59 y.o. male.    Pcp:  ? Meridee Score (orthpedics)  Chief Complaint: left foot pain HPI: 59 yo male with hx of Dm2, Pafib (chads2=3), osteomyelitis of the left foot apparently left AMA.  Pt apparently has L foot pain and therefore represented this evening for evaluation, and was found to be in ARF.  Pt admits to taking naproxen. And his creatinine before leaving 09/24/2014 was 2.20.   Pt will be admitted for ARF, and also for osteomyelitis.   Past Medical History  Diagnosis Date  . PAF (paroxysmal atrial fibrillation)   . Diabetes mellitus   . Hypothyroidism   . Hyperlipidemia   . Hypertension   . Chronic pain   . OA (osteoarthritis)   . COPD (chronic obstructive pulmonary disease)   . RA (rheumatoid arthritis)   . Hepatitis C   . Venous stasis   . Bipolar 1 disorder   . Hepatitis C   . Fibromyalgia   . Dysrhythmia     PAF  . Asthma   . Anxiety   . Melanoma     face, shoulder arm  . Pancreatitis, acute 2015    Past Surgical History  Procedure Laterality Date  . Thyroidectomy    . Tonsillectomy    . US echocardiography  02/19/2010    EF 55-60%  . Skin cancer excision      melonoma  . Fracture surgery Left     arm  . Submandibular gland excision Left 12/25/2013    w/sialolithotomy  . Inguinal hernia repair Bilateral   . Hernia repair      umbicial  . Knee surgery      /H&P 02/20/2010  . Submandibular gland excision Left 12/25/2013    Procedure: SUBMANDIBULAR GLAND STONE AND CHRONIC SIALOLITHIASIS EXCISION;  Surgeon: Melida Quitter, MD;  Location: [facility] OR;  Service: ENT;  Laterality: Left;    Family History  Problem Relation Age of Onset  . Hypertension Mother   . Alzheimer's disease Mother   . Heart disease Father    Social History:  reports that he has been smoking.  He does not have any smokeless tobacco history on file. He reports that he drinks about 0.6 oz of alcohol per week. He reports that he does not use illicit drugs.  Allergies:   Allergies  Allergen Reactions  . Oxycodone Hcl Other (See Comments)    Makes hyper  . Antihistamines, Chlorpheniramine-Type Other (See Comments)    unknown  . Iodinated Diagnostic Agents Other (See Comments)    Patient states he doesn't know what iodine is and doesn't think he is allergic to it despite it being listed with his allergies  . Lidocaine Itching    Patient is uncertain of this allergy  . Methadone Other (See Comments)    "I won't take it anymore"  . Other Hives    Steroid creams  . Pentazocine Lactate Other (See Comments)    unknown  . Sulfa Antibiotics Itching and Other (See Comments)    whelps  . Vicodin [Hydrocodone-Acetaminophen] Palpitations and Other (See Comments)    Doesn't want to take    Medications Prior to Admission  Medication Sig Dispense Refill  . amLODipine (NORVASC) 5 MG tablet Take 5 mg by mouth daily.    Marland Kitchen amoxicillin-clavulanate (AUGMENTIN) 875-125 MG per tablet Take 1 tablet by mouth 2 (two) times daily. 20 tablet 0  . cholecalciferol (VITAMIN D) 1000 UNITS tablet Take 2,000 Units by mouth every evening.     Marland Kitchen  COUMADIN 10 MG tablet Take 1 tablet (10 mg total) by mouth as directed. (Patient taking differently: Take 10 mg by mouth as directed. Monday, Wednesday, Friday and Saturday - alternating with 7.69m) 30 tablet 1  . COUMADIN 7.5 MG tablet Take 1 tablet (7.5 mg total) by mouth as directed. (Patient taking differently: Take 7.5 mg by mouth as directed. Sunday, Tuesday, Thursday - alternating with 142m 30 tablet 1  . diazepam (VALIUM) 10 MG tablet Take 10 mg by mouth 3 (three) times daily. For anxiety    . doxycycline (VIBRAMYCIN) 50 MG capsule Take 2 capsules (100 mg total) by mouth 2 (two) times daily. 20 capsule 0  . fluticasone (FLONASE) 50 MCG/ACT nasal spray Place 1 spray into both nostrils daily as needed for allergies.     . Marland Kitchennsulin glargine (LANTUS) 100 unit/mL SOPN Inject 0.27 mLs (27 Units total) into the skin at bedtime. PEN 15 mL 0  .  levothyroxine (SYNTHROID, LEVOTHROID) 88 MCG tablet Take 1 tablet (88 mcg total) by mouth daily. 30 tablet 3  . lisinopril (PRINIVIL,ZESTRIL) 40 MG tablet Take 40 mg by mouth every morning.   0  . loratadine (CLARITIN) 10 MG tablet Take 1 tablet (10 mg total) by mouth daily as needed for allergies. 30 tablet 0  . metoprolol (LOPRESSOR) 50 MG tablet Take 1 tablet (50 mg total) by mouth daily. 30 tablet 5  . Multiple Vitamin (MULTIVITAMIN) tablet Take 1 tablet by mouth every morning.     . Marland Kitchenmeprazole (PRILOSEC) 40 MG capsule Take 1 capsule (40 mg total) by mouth daily. 30 capsule 5  . simvastatin (ZOCOR) 20 MG tablet Take 1 tablet (20 mg total) by mouth every evening. 30 tablet 0  . tiotropium (SPIRIVA) 18 MCG inhalation capsule Place 1 capsule (18 mcg total) into inhaler and inhale daily. 30 capsule 0  . Insulin Pen Needle (SURE COMFORT PEN NEEDLES) 31G X 8 MM MISC Use insulin once nightly for E11.9 100 each 4  . nicotine (NICODERM CQ - DOSED IN MG/24 HOURS) 21 mg/24hr patch Place 1 patch (21 mg total) onto the skin daily. 28 patch 0  . oxyCODONE (ROXICODONE) 30 MG immediate release tablet Take 1 tablet (30 mg total) by mouth 5 (five) times daily. 35 tablet 0  . simvastatin (ZOCOR) 20 MG tablet Take 1 tablet (20 mg total) by mouth daily. 30 tablet 5    Results for orders placed or performed during the hospital encounter of 09/25/14 (from the past 48 hour(s))  CBC with Differential     Status: Abnormal   Collection Time: 09/25/14  7:26 PM  Result Value Ref Range   WBC 10.4 4.0 - 10.5 K/uL   RBC 4.51 4.22 - 5.81 MIL/uL   Hemoglobin 12.8 (L) 13.0 - 17.0 g/dL   HCT 38.0 (L) 39.0 - 52.0 %   MCV 84.3 78.0 - 100.0 fL   MCH 28.4 26.0 - 34.0 pg   MCHC 33.7 30.0 - 36.0 g/dL   RDW 15.2 11.5 - 15.5 %   Platelets 213 150 - 400 K/uL   Neutrophils Relative % 62 43 - 77 %   Neutro Abs 6.4 1.7 - 7.7 K/uL   Lymphocytes Relative 27 12 - 46 %   Lymphs Abs 2.8 0.7 - 4.0 K/uL   Monocytes Relative 9 3 - 12 %    Monocytes Absolute 0.9 0.1 - 1.0 K/uL   Eosinophils Relative 2 0 - 5 %   Eosinophils Absolute 0.2 0.0 - 0.7 K/uL  Basophils Relative 0 0 - 1 %   Basophils Absolute 0.0 0.0 - 0.1 K/uL  Basic metabolic panel     Status: Abnormal   Collection Time: 09/25/14  7:26 PM  Result Value Ref Range   Sodium 137 135 - 145 mmol/L   Potassium 3.5 3.5 - 5.1 mmol/L   Chloride 107 96 - 112 mmol/L   CO2 20 19 - 32 mmol/L   Glucose, Bld 94 70 - 99 mg/dL   BUN 27 (H) 6 - 23 mg/dL    Comment: DELTA CHECK NOTED   Creatinine, Ser 3.02 (H) 0.50 - 1.35 mg/dL   Calcium 7.9 (L) 8.4 - 10.5 mg/dL   GFR calc non Af Amer 21 (L) >90 mL/min   GFR calc Af Amer 25 (L) >90 mL/min    Comment: (NOTE) The eGFR has been calculated using the CKD EPI equation. This calculation has not been validated in all clinical situations. eGFR's persistently <90 mL/min signify possible Chronic Kidney Disease.    Anion gap 10 5 - 15  Protime-INR     Status: Abnormal   Collection Time: 09/25/14  7:26 PM  Result Value Ref Range   Prothrombin Time 23.6 (H) 11.6 - 15.2 seconds   INR 2.08 (H) 0.00 - 1.49  I-Stat Troponin, ED (not at [facility])     Status: None   Collection Time: 09/25/14  8:45 PM  Result Value Ref Range   Troponin i, poc 0.00 0.00 - 0.08 ng/mL   Comment 3            Comment: Due to the release kinetics of cTnI, a negative result within the first hours of the onset of symptoms does not rule out myocardial infarction with certainty. If myocardial infarction is still suspected, repeat the test at appropriate intervals.   I-Stat CG4 Lactic Acid, ED     Status: None   Collection Time: 09/25/14  8:47 PM  Result Value Ref Range   Lactic Acid, Venous 0.69 0.5 - 2.0 mmol/L   No results found.  Review of Systems  Constitutional: Negative.   HENT: Negative.   Eyes: Negative.   Respiratory: Negative.   Cardiovascular: Negative.   Gastrointestinal: Negative.   Genitourinary: Negative.   Musculoskeletal: Positive for  joint pain. Negative for myalgias, back pain, falls and neck pain.  Skin: Negative.   Neurological: Negative.   Endo/Heme/Allergies: Negative.   Psychiatric/Behavioral: Negative.     Blood pressure 137/84, pulse 97, temperature 98.6 F (37 C), temperature source Oral, resp. rate 13, SpO2 96 %. Physical Exam  Constitutional: He is oriented to person, place, and time. He appears well-developed and well-nourished.  HENT:  Head: Normocephalic and atraumatic.  Mouth/Throat: No oropharyngeal exudate.  Eyes: Conjunctivae and EOM are normal. Pupils are equal, round, and reactive to light. No scleral icterus.  Neck: Normal range of motion. Neck supple. No JVD present. No tracheal deviation present. No thyromegaly present.  Cardiovascular: Normal rate and regular rhythm.  Exam reveals no gallop and no friction rub.   No murmur heard. Respiratory: Effort normal and breath sounds normal. No respiratory distress. He has no wheezes. He has no rales.  GI: Soft. Bowel sounds are normal. He exhibits no distension. There is no tenderness. There is no rebound and no guarding.  Musculoskeletal: Normal range of motion. He exhibits no edema or tenderness.  Lymphadenopathy:    He has no cervical adenopathy.  Neurological: He is alert and oriented to person, place, and time. He has normal reflexes.  He displays normal reflexes. No cranial nerve deficit. He exhibits normal muscle tone. Coordination normal.  Skin: Skin is warm and dry. No rash noted. There is erythema. No pallor.  + erythema on the dorsum of the left foot  Psychiatric: He has a normal mood and affect. His behavior is normal. Judgment and thought content normal.     Assessment/Plan ARF Check ua, Check urine sodium , urine creatinine, urine eosinophils Check renal ultrasound Hydrate with normal saline Pt told to avoid NSAIDS Check cmp in am  Osteomyelitis Zosyn iv pharmacy to dose, zyvox  Iv pharmacy to dose Can't use vanco due to renal  insufficiency  Dm2 fsbs ac and qhs, iss  Pafib (chads2=3),  Coumadin pharmacy to dose  Protein calorie malnutrition prostat  Anemia Check cbc in am  Tachycardia Check trop i q6h x3 Cont metoprolol  Hypothyroidism Cont levothyroxine   Tahjae Durr 09/25/2014, 10:10 PM

## 2014-09-25 NOTE — Progress Notes (Signed)
Received report. Will await for pt to transfer to floor.

## 2014-09-25 NOTE — Progress Notes (Addendum)
New Admission Note:   Arrival Method: via stretcher from ED Mental Orientation: A&O x4 Telemetry: Placed on box 6E30 per MD order Assessment: Completed Skin: Warm and dry. BLE with discoloration. Right foot second toe dry, callused, and darkedned (osteomyelitis) IV: Right hand Peripheral IV Normal saline at 100 mL/hr Pain: 10/10 Bilateral leg pain Tubes: N/A Safety Measures: Educated on fall prevention safety plan, patient acknowledged and understood. Admission: Completed [address] Orientation: Patient has been orientated to the room, unit and staff.  Family: Updated via phone per pt  Orders have been reviewed and implemented. Will continue to monitor the patient. Call light has been placed within reach.  Patient refusing bed alarm. Patient educated about fall prevention safety plan. Side rails and grip socks in place. Demonstrated to utilize call bell light to call for assistance. Patient acknowledges and verbalizes understanding. Staff will continue to monitor.    Dorothea Glassman, RN  Phone number: [phone]

## 2014-09-26 LAB — PROTIME-INR
INR: 1.92 — AB (ref 0.00–1.49)
PROTHROMBIN TIME: 22.2 s — AB (ref 11.6–15.2)

## 2014-09-26 LAB — SODIUM, URINE, RANDOM: Sodium, Ur: 88 mmol/L

## 2014-09-26 LAB — COMPREHENSIVE METABOLIC PANEL
ALK PHOS: 51 U/L (ref 39–117)
ALT: 28 U/L (ref 0–53)
AST: 42 U/L — ABNORMAL HIGH (ref 0–37)
Albumin: 2 g/dL — ABNORMAL LOW (ref 3.5–5.2)
Anion gap: 5 (ref 5–15)
BUN: 26 mg/dL — AB (ref 6–23)
CO2: 24 mmol/L (ref 19–32)
CREATININE: 2.82 mg/dL — AB (ref 0.50–1.35)
Calcium: 7.9 mg/dL — ABNORMAL LOW (ref 8.4–10.5)
Chloride: 109 mmol/L (ref 96–112)
GFR calc Af Amer: 27 mL/min — ABNORMAL LOW (ref >90)
GFR calc non Af Amer: 23 mL/min — ABNORMAL LOW (ref >90)
GLUCOSE: 186 mg/dL — AB (ref 70–99)
POTASSIUM: 3.8 mmol/L (ref 3.5–5.1)
Sodium: 138 mmol/L (ref 135–145)
TOTAL PROTEIN: 4.9 g/dL — AB (ref 6.0–8.3)
Total Bilirubin: 0.4 mg/dL (ref 0.3–1.2)

## 2014-09-26 LAB — CBC
HCT: 37.6 % — ABNORMAL LOW (ref 39.0–52.0)
Hemoglobin: 12.6 g/dL — ABNORMAL LOW (ref 13.0–17.0)
MCH: 28.6 pg (ref 26.0–34.0)
MCHC: 33.5 g/dL (ref 30.0–36.0)
MCV: 85.5 fL (ref 78.0–100.0)
PLATELETS: 180 10*3/uL (ref 150–400)
RBC: 4.4 MIL/uL (ref 4.22–5.81)
RDW: 15.2 % (ref 11.5–15.5)
WBC: 8.2 10*3/uL (ref 4.0–10.5)

## 2014-09-26 LAB — CALCIUM / CREATININE RATIO, URINE
CALCIUM/CREAT. RATIO: 0
Calcium, Ur: 1 mg/dL
Creatinine, Urine: 99.9 mg/dL

## 2014-09-26 LAB — GLUCOSE, CAPILLARY
Glucose-Capillary: 114 mg/dL — ABNORMAL HIGH (ref 70–99)
Glucose-Capillary: 154 mg/dL — ABNORMAL HIGH (ref 70–99)

## 2014-09-26 MED ORDER — DILTIAZEM HCL 25 MG/5ML IV SOLN
10.0000 mg | Freq: Once | INTRAVENOUS | Status: AC
  Administered 2014-09-26: 10 mg via INTRAVENOUS
  Filled 2014-09-26 (×2): qty 5

## 2014-09-26 NOTE — Progress Notes (Signed)
Paged Dr. Eliseo Squires, pt states novolog gives seizures received 2 units, pt only takes Lantus 28 units QHS,  No apparent reaction at this time.

## 2014-09-26 NOTE — Progress Notes (Signed)
Pt left floor ambulating home

## 2014-09-26 NOTE — Progress Notes (Addendum)
Patient in SVTs HR 150s-170s. Patient is sleeping at this time. Asymptomatic. Notified T. Rogue Bussing, NP. New orders received for STAT EKG. Results notified to T. Rogue Bussing. Orders received to administer cardizem 10mg  injection. HR slowly trending down. Verbal order to give metoprolol dose early this AM. Will continue to monitor.

## 2014-09-26 NOTE — Discharge Summary (Signed)
Physician Discharge Summary  Johnny Sims W[facility]:[id number] DOB: March 08, 1956 DOA: 09/25/2014  PCP: Chari Manning, NP  Admit date: 09/25/2014 Discharge date: 09/26/2014  Time spent: 25 minutes   PATIENT LEFT AMA   Recommendations for Outpatient Follow-up:  1. Left AMA   Spoke a length with patient.  Says he has to pay rent and his [employer] power bill.  He and Dr. Maudie Mercury worked it out last PM Plans to come back after doing that.  Went over possible complications including death and debility.  Discharge Diagnoses:  Active Problems:   Paroxysmal atrial fibrillation   Diabetes mellitus   Osteomyelitis of foot, acute   AKI (acute kidney injury)   ARF (acute renal failure)   Discharge Condition: AMA  Discharge Instructions  LEFT AMA   Current Discharge Medication List    CONTINUE these medications which have NOT CHANGED   Details  amLODipine (NORVASC) 5 MG tablet Take 5 mg by mouth daily.    amoxicillin-clavulanate (AUGMENTIN) 875-125 MG per tablet Take 1 tablet by mouth 2 (two) times daily. Qty: 20 tablet, Refills: 0    cholecalciferol (VITAMIN D) 1000 UNITS tablet Take 2,000 Units by mouth every evening.     !! COUMADIN 10 MG tablet Take 1 tablet (10 mg total) by mouth as directed. Qty: 30 tablet, Refills: 1    !! COUMADIN 7.5 MG tablet Take 1 tablet (7.5 mg total) by mouth as directed. Qty: 30 tablet, Refills: 1    diazepam (VALIUM) 10 MG tablet Take 10 mg by mouth 3 (three) times daily. For anxiety    doxycycline (VIBRAMYCIN) 50 MG capsule Take 2 capsules (100 mg total) by mouth 2 (two) times daily. Qty: 20 capsule, Refills: 0    fluticasone (FLONASE) 50 MCG/ACT nasal spray Place 1 spray into both nostrils daily as needed for allergies.     insulin glargine (LANTUS) 100 unit/mL SOPN Inject 0.27 mLs (27 Units total) into the skin at bedtime. PEN Qty: 15 mL, Refills: 0    levothyroxine (SYNTHROID, LEVOTHROID) 88 MCG tablet Take 1 tablet (88 mcg total) by mouth daily. Qty: 30  tablet, Refills: 3   Associated Diagnoses: Thyroid disorder    lisinopril (PRINIVIL,ZESTRIL) 40 MG tablet Take 40 mg by mouth every morning.  Refills: 0    loratadine (CLARITIN) 10 MG tablet Take 1 tablet (10 mg total) by mouth daily as needed for allergies. Qty: 30 tablet, Refills: 0    metoprolol (LOPRESSOR) 50 MG tablet Take 1 tablet (50 mg total) by mouth daily. Qty: 30 tablet, Refills: 5   Associated Diagnoses: Paroxysmal atrial fibrillation    Multiple Vitamin (MULTIVITAMIN) tablet Take 1 tablet by mouth every morning.     omeprazole (PRILOSEC) 40 MG capsule Take 1 capsule (40 mg total) by mouth daily. Qty: 30 capsule, Refills: 5   Associated Diagnoses: Gastroesophageal reflux disease, esophagitis presence not specified    !! simvastatin (ZOCOR) 20 MG tablet Take 1 tablet (20 mg total) by mouth every evening. Qty: 30 tablet, Refills: 0    tiotropium (SPIRIVA) 18 MCG inhalation capsule Place 1 capsule (18 mcg total) into inhaler and inhale daily. Qty: 30 capsule, Refills: 0    Insulin Pen Needle (SURE COMFORT PEN NEEDLES) 31G X 8 MM MISC Use insulin once nightly for E11.9 Qty: 100 each, Refills: 4   Associated Diagnoses: Type 2 diabetes mellitus without complication    nicotine (NICODERM CQ - DOSED IN MG/24 HOURS) 21 mg/24hr patch Place 1 patch (21 mg total) onto the skin  daily. Qty: 28 patch, Refills: 0    oxyCODONE (ROXICODONE) 30 MG immediate release tablet Take 1 tablet (30 mg total) by mouth 5 (five) times daily. Qty: 35 tablet, Refills: 0    !! simvastatin (ZOCOR) 20 MG tablet Take 1 tablet (20 mg total) by mouth daily. Qty: 30 tablet, Refills: 5   Associated Diagnoses: HLD (hyperlipidemia)     !! - Potential duplicate medications found. Please discuss with provider.     Allergies  Allergen Reactions  . Oxycodone Hcl Other (See Comments)    Makes hyper  . Antihistamines, Chlorpheniramine-Type Other (See Comments)    unknown  . Iodinated Diagnostic Agents  Other (See Comments)    Patient states he doesn't know what iodine is and doesn't think he is allergic to it despite it being listed with his allergies  . Lidocaine Itching    Patient is uncertain of this allergy  . Methadone Other (See Comments)    "I won't take it anymore"  . Novolog [Insulin Aspart]     "I'm just highly allergic"  . Other Hives    Steroid creams  . Pentazocine Lactate Other (See Comments)    unknown  . Sulfa Antibiotics Itching and Other (See Comments)    whelps  . Vicodin [Hydrocodone-Acetaminophen] Palpitations and Other (See Comments)    Doesn't want to take      The results of significant diagnostics from this hospitalization (including imaging, microbiology, ancillary and laboratory) are listed below for reference.    Significant Diagnostic Studies: Dg Foot 2 Views Left  09/23/2014   CLINICAL DATA:  Left foot and leg swelling. Cellulitis. Open wound on the distal tip of the second toe.  EXAM: LEFT FOOT - 2 VIEW  COMPARISON:  Radiographs dated 08/05/2009 and 01/19/2009  FINDINGS: There is destruction of tip of the distal phalangeal bone of the second toe consistent with osteomyelitis.  There is soft tissue swelling of the forefoot. There is minimal dorsal spurring in the midfoot. Small plantar calcaneal spur. Minimal osteophyte formation on the anterior aspect of the distal tibia.  IMPRESSION: Osteomyelitis of the distal phalanx of the second toe.   Electronically Signed   By: Lorriane Shire M.D.   On: 09/23/2014 20:05    Microbiology: Recent Results (from the past 240 hour(s))  Culture, blood (routine x 2)     Status: None (Preliminary result)   Collection Time: 09/23/14 11:20 PM  Result Value Ref Range Status   Specimen Description BLOOD RIGHT HAND  Final   Special Requests BOTTLES DRAWN AEROBIC ONLY 4CC  Final   Culture   Final           BLOOD CULTURE RECEIVED NO GROWTH TO DATE CULTURE WILL BE HELD FOR 5 DAYS BEFORE ISSUING A FINAL NEGATIVE  REPORT Performed at [facility]    Report Status PENDING  Incomplete  Culture, blood (routine x 2)     Status: None (Preliminary result)   Collection Time: 09/23/14 11:28 PM  Result Value Ref Range Status   Specimen Description BLOOD LEFT ARM  Final   Special Requests BOTTLES DRAWN AEROBIC ONLY 10CC  Final   Culture   Final           BLOOD CULTURE RECEIVED NO GROWTH TO DATE CULTURE WILL BE HELD FOR 5 DAYS BEFORE ISSUING A FINAL NEGATIVE REPORT Note: Culture results may be compromised due to an excessive volume of blood received in culture bottles. Performed at [facility]    Report Status  PENDING  Incomplete     Labs: Basic Metabolic Panel:  Recent Labs Lab 09/23/14 1852 09/24/14 0453 09/25/14 1926 09/26/14 0530  NA 140 139 137 138  K 3.8 3.6 3.5 3.8  CL 108 110 107 109  CO2 29 25 20 24   GLUCOSE 131* 179* 94 186*  BUN 16 15 27* 26*  CREATININE 2.15* 2.20* 3.02* 2.82*  CALCIUM 8.0* 7.5* 7.9* 7.9*   Liver Function Tests:  Recent Labs Lab 09/24/14 0453 09/26/14 0530  AST 26 42*  ALT 20 28  ALKPHOS 49 51  BILITOT 0.4 0.4  PROT 4.0* 4.9*  ALBUMIN 1.6* 2.0*   No results for input(s): LIPASE, AMYLASE in the last 168 hours. No results for input(s): AMMONIA in the last 168 hours. CBC:  Recent Labs Lab 09/23/14 1852 09/24/14 0453 09/25/14 1926 09/26/14 0530  WBC 10.8* 8.7 10.4 8.2  NEUTROABS 6.4  --  6.4  --   HGB 14.0 12.1* 12.8* 12.6*  HCT 39.5 34.9* 38.0* 37.6*  MCV 82.8 84.5 84.3 85.5  PLT 219 186 213 180   Cardiac Enzymes: No results for input(s): CKTOTAL, CKMB, CKMBINDEX, TROPONINI in the last 168 hours. BNP: BNP (last 3 results) No results for input(s): BNP in the last 8760 hours.  ProBNP (last 3 results) No results for input(s): PROBNP in the last 8760 hours.  CBG:  Recent Labs Lab 09/23/14 2238 09/24/14 0753 09/25/14 2242 09/26/14 0812  GLUCAP 133* 107* 114* 154*       Signed:  Eyva Califano  [facility] 09/26/2014, 11:31 AM

## 2014-09-26 NOTE — Progress Notes (Signed)
Pt leaving AMA, Dr. Eliseo Squires saw pt in room.  Pt signed AMA papers. Removing IV.

## 2014-09-27 LAB — HEMOGLOBIN A1C
HEMOGLOBIN A1C: 7.4 % — AB (ref 4.8–5.6)
MEAN PLASMA GLUCOSE: 166 mg/dL

## 2014-09-30 LAB — CULTURE, BLOOD (ROUTINE X 2)
CULTURE: NO GROWTH
Culture: NO GROWTH

## 2014-10-07 ENCOUNTER — Encounter (HOSPITAL_COMMUNITY): Payer: Self-pay

## 2014-10-07 ENCOUNTER — Emergency Department (HOSPITAL_COMMUNITY)
Admission: EM | Admit: 2014-10-07 | Discharge: 2014-10-07 | Disposition: A | Discharge status: Home / Self Care | Payer: Medicaid Other | Attending: Emergency Medicine | Admitting: Emergency Medicine

## 2014-10-07 ENCOUNTER — Emergency Department (HOSPITAL_COMMUNITY): Payer: Medicaid Other

## 2014-10-07 DIAGNOSIS — Z8619 Personal history of other infectious and parasitic diseases: Secondary | ICD-10-CM | POA: Diagnosis not present

## 2014-10-07 DIAGNOSIS — Z85820 Personal history of malignant melanoma of skin: Secondary | ICD-10-CM | POA: Diagnosis not present

## 2014-10-07 DIAGNOSIS — E785 Hyperlipidemia, unspecified: Secondary | ICD-10-CM | POA: Insufficient documentation

## 2014-10-07 DIAGNOSIS — Z720 Tobacco use: Secondary | ICD-10-CM | POA: Diagnosis not present

## 2014-10-07 DIAGNOSIS — Z8719 Personal history of other diseases of the digestive system: Secondary | ICD-10-CM | POA: Insufficient documentation

## 2014-10-07 DIAGNOSIS — G8929 Other chronic pain: Secondary | ICD-10-CM | POA: Diagnosis not present

## 2014-10-07 DIAGNOSIS — I480 Paroxysmal atrial fibrillation: Secondary | ICD-10-CM | POA: Diagnosis not present

## 2014-10-07 DIAGNOSIS — J449 Chronic obstructive pulmonary disease, unspecified: Secondary | ICD-10-CM | POA: Insufficient documentation

## 2014-10-07 DIAGNOSIS — F419 Anxiety disorder, unspecified: Secondary | ICD-10-CM | POA: Insufficient documentation

## 2014-10-07 DIAGNOSIS — M7989 Other specified soft tissue disorders: Secondary | ICD-10-CM | POA: Diagnosis present

## 2014-10-07 DIAGNOSIS — E119 Type 2 diabetes mellitus without complications: Secondary | ICD-10-CM | POA: Insufficient documentation

## 2014-10-07 DIAGNOSIS — I129 Hypertensive chronic kidney disease with stage 1 through stage 4 chronic kidney disease, or unspecified chronic kidney disease: Secondary | ICD-10-CM | POA: Diagnosis not present

## 2014-10-07 DIAGNOSIS — N189 Chronic kidney disease, unspecified: Secondary | ICD-10-CM | POA: Diagnosis not present

## 2014-10-07 DIAGNOSIS — M86672 Other chronic osteomyelitis, left ankle and foot: Secondary | ICD-10-CM | POA: Diagnosis not present

## 2014-10-07 DIAGNOSIS — Z792 Long term (current) use of antibiotics: Secondary | ICD-10-CM | POA: Diagnosis not present

## 2014-10-07 DIAGNOSIS — Z79899 Other long term (current) drug therapy: Secondary | ICD-10-CM | POA: Insufficient documentation

## 2014-10-07 DIAGNOSIS — Z794 Long term (current) use of insulin: Secondary | ICD-10-CM | POA: Insufficient documentation

## 2014-10-07 DIAGNOSIS — E039 Hypothyroidism, unspecified: Secondary | ICD-10-CM | POA: Diagnosis not present

## 2014-10-07 DIAGNOSIS — M79675 Pain in left toe(s): Secondary | ICD-10-CM

## 2014-10-07 MED ORDER — IBUPROFEN 400 MG PO TABS
600.0000 mg | ORAL_TABLET | Freq: Once | ORAL | Status: AC
  Administered 2014-10-07: 600 mg via ORAL
  Filled 2014-10-07 (×2): qty 1

## 2014-10-07 NOTE — ED Notes (Signed)
Pt brought in by EMS today for bilateral leg swelling that he woke up with this morning and it was worse. sts the right is worse than the left.

## 2014-10-07 NOTE — ED Notes (Signed)
Pt. Arrived via [place]  Due to having bilateral leg swelling. GCS 15, Pt. Ambulated without any difficulty.

## 2014-10-07 NOTE — ED Provider Notes (Signed)
CSN: [id number]     Arrival date & time 10/07/14  1121 History   First MD Initiated Contact with Patient 10/07/14 1456     Chief Complaint  Patient presents with  . Leg Swelling     (Consider location/radiation/quality/duration/timing/severity/associated sxs/prior Treatment) Patient is a 59 y.o. male presenting with lower extremity pain. The history is provided by the patient and medical records. No language interpreter was used.  Foot Pain This is a chronic problem. The current episode started more than 1 month ago. The problem occurs constantly. The problem has been unchanged. Associated symptoms include a rash. Pertinent negatives include no abdominal pain, change in bowel habit, chest pain, chills, congestion, coughing, fatigue, fever, headaches, myalgias, nausea, numbness, vertigo, vomiting or weakness. Nothing aggravates the symptoms. He has tried nothing for the symptoms. The treatment provided no relief.    Past Medical History  Diagnosis Date  . PAF (paroxysmal atrial fibrillation)   . Diabetes mellitus   . Hypothyroidism   . Hyperlipidemia   . Hypertension   . Chronic pain   . OA (osteoarthritis)   . COPD (chronic obstructive pulmonary disease)   . RA (rheumatoid arthritis)   . Hepatitis C   . Venous stasis   . Bipolar 1 disorder   . Hepatitis C   . Fibromyalgia   . Dysrhythmia     PAF  . Asthma   . Anxiety   . Melanoma     face, shoulder arm  . Pancreatitis, acute 2015   Past Surgical History  Procedure Laterality Date  . Thyroidectomy    . Tonsillectomy    . US echocardiography  02/19/2010    EF 55-60%  . Skin cancer excision      melonoma  . Fracture surgery Left     arm  . Submandibular gland excision Left 12/25/2013    w/sialolithotomy  . Inguinal hernia repair Bilateral   . Hernia repair      umbicial  . Knee surgery      /H&P 02/20/2010  . Submandibular gland excision Left 12/25/2013    Procedure: SUBMANDIBULAR GLAND STONE AND CHRONIC SIALOLITHIASIS  EXCISION;  Surgeon: Melida Quitter, MD;  Location: [facility] OR;  Service: ENT;  Laterality: Left;   Family History  Problem Relation Age of Onset  . Hypertension Mother   . Alzheimer's disease Mother   . Heart disease Father    History  Substance Use Topics  . Smoking status: Current Every Day Smoker -- 1.00 packs/day for 43 years  . Smokeless tobacco: Not on file  . Alcohol Use: 0.6 oz/week    1 Cans of beer per week    Review of Systems  Constitutional: Negative for fever, chills and fatigue.  HENT: Negative for congestion.   Respiratory: Negative for cough, chest tightness and shortness of breath.   Cardiovascular: Positive for leg swelling. Negative for chest pain and palpitations.  Gastrointestinal: Negative for nausea, vomiting, abdominal pain and change in bowel habit.  Musculoskeletal: Negative for myalgias.  Skin: Positive for color change, rash and wound.  Neurological: Negative for vertigo, weakness, light-headedness, numbness and headaches.  Psychiatric/Behavioral: Negative for confusion.  All other systems reviewed and are negative.     Allergies  Oxycodone hcl; Antihistamines, chlorpheniramine-type; Iodinated diagnostic agents; Lidocaine; Methadone; Novolog; Other; Pentazocine lactate; Sulfa antibiotics; and Vicodin  Home Medications   Prior to Admission medications   Medication Sig Start Date End Date Taking? Authorizing Provider  amLODipine (NORVASC) 5 MG tablet Take 5 mg by mouth daily.  Historical Provider, MD  amoxicillin-clavulanate (AUGMENTIN) 875-125 MG per tablet Take 1 tablet by mouth 2 (two) times daily. 09/24/14   Reyne Dumas, MD  cholecalciferol (VITAMIN D) 1000 UNITS tablet Take 2,000 Units by mouth every evening.     Historical Provider, MD  COUMADIN 10 MG tablet Take 1 tablet (10 mg total) by mouth as directed. Patient taking differently: Take 10 mg by mouth as directed. Monday, Wednesday, Friday and Saturday - alternating with 7.5mg  08/20/14   Thayer Headings, MD  COUMADIN 7.5 MG tablet Take 1 tablet (7.5 mg total) by mouth as directed. Patient taking differently: Take 7.5 mg by mouth as directed. Sunday, Tuesday, Thursday - alternating with 10mg  08/20/14   Thayer Headings, MD  diazepam (VALIUM) 10 MG tablet Take 10 mg by mouth 3 (three) times daily. For anxiety    Historical Provider, MD  doxycycline (VIBRAMYCIN) 50 MG capsule Take 2 capsules (100 mg total) by mouth 2 (two) times daily. 09/24/14   Reyne Dumas, MD  fluticasone (FLONASE) 50 MCG/ACT nasal spray Place 1 spray into both nostrils daily as needed for allergies.     Historical Provider, MD  insulin glargine (LANTUS) 100 unit/mL SOPN Inject 0.27 mLs (27 Units total) into the skin at bedtime. PEN 08/16/14   Britt Bottom, NP  Insulin Pen Needle (SURE COMFORT PEN NEEDLES) 31G X 8 MM MISC Use insulin once nightly for E11.9 08/28/14   Lance Bosch, NP  levothyroxine (SYNTHROID, LEVOTHROID) 88 MCG tablet Take 1 tablet (88 mcg total) by mouth daily. 08/28/14   Lance Bosch, NP  lisinopril (PRINIVIL,ZESTRIL) 40 MG tablet Take 40 mg by mouth every morning.  07/25/14   Historical Provider, MD  loratadine (CLARITIN) 10 MG tablet Take 1 tablet (10 mg total) by mouth daily as needed for allergies. 08/16/14   Britt Bottom, NP  metoprolol (LOPRESSOR) 50 MG tablet Take 1 tablet (50 mg total) by mouth daily. 08/28/14   Lance Bosch, NP  Multiple Vitamin (MULTIVITAMIN) tablet Take 1 tablet by mouth every morning.     Historical Provider, MD  nicotine (NICODERM CQ - DOSED IN MG/24 HOURS) 21 mg/24hr patch Place 1 patch (21 mg total) onto the skin daily. 09/24/14   Reyne Dumas, MD  omeprazole (PRILOSEC) 40 MG capsule Take 1 capsule (40 mg total) by mouth daily. 08/28/14   Lance Bosch, NP  oxyCODONE (ROXICODONE) 30 MG immediate release tablet Take 1 tablet (30 mg total) by mouth 5 (five) times daily. 12/26/13   Melida Quitter, MD  simvastatin (ZOCOR) 20 MG tablet Take 1 tablet (20 mg total) by mouth  every evening. 08/16/14   Britt Bottom, NP  simvastatin (ZOCOR) 20 MG tablet Take 1 tablet (20 mg total) by mouth daily. 08/28/14   Lance Bosch, NP  tiotropium (SPIRIVA) 18 MCG inhalation capsule Place 1 capsule (18 mcg total) into inhaler and inhale daily. 08/16/14   Britt Bottom, NP   BP 158/97 mmHg  Pulse 91  Temp(Src) 97.8 F (36.6 C) (Oral)  Resp 19  Ht 6\' 9"  (2.057 m)  Wt 312 lb 9.6 oz (141.794 kg)  BMI 33.51 kg/m2  SpO2 99% Physical Exam  Constitutional: He is oriented to person, place, and time. He appears well-developed and well-nourished. No distress.  HENT:  Head: Normocephalic and atraumatic.  Nose: Nose normal.  Mouth/Throat: Oropharynx is clear and moist. No oropharyngeal exudate.  Eyes: EOM are normal. Pupils are equal, round, and reactive to light.  Neck: Normal range  of motion. Neck supple.  Cardiovascular: Normal rate, regular rhythm, normal heart sounds and intact distal pulses.   No murmur heard. Pulmonary/Chest: Effort normal and breath sounds normal. No respiratory distress. He has no wheezes. He exhibits no tenderness.  Abdominal: Soft. He exhibits no distension. There is no tenderness. There is no guarding.  Musculoskeletal: Normal range of motion. He exhibits no tenderness.  Neurological: He is alert and oriented to person, place, and time. No cranial nerve deficit. Coordination normal.  Skin: Skin is warm and dry. He is not diaphoretic. No pallor.     Psychiatric: He has a normal mood and affect. His behavior is normal. Judgment and thought content normal.  Nursing note and vitals reviewed.   ED Course  Procedures (including critical care time) Labs Review Labs Reviewed - No data to display  Imaging Review Dg Foot 2 Views Left  10/07/2014   CLINICAL DATA:  Osteomyelitis second digit  EXAM: LEFT FOOT - 2 VIEW  COMPARISON:  September 23, 2014  FINDINGS: Frontal and lateral views were obtained. Bony destruction involving the distal aspect of the  second distal phalanx is stable. No new bony destruction. No fracture or dislocation. There is a bandage overlying the second distal phalanx. Joint spaces overall appear intact. There is an inferior calcaneal spur.  IMPRESSION: Stable bony destruction involving the distal aspect of the second distal phalanx. No new bony destruction. No acute fracture or dislocation.   Electronically Signed   By: Lowella Grip III M.D.   On: 10/07/2014 15:31     EKG Interpretation None      MDM   Final diagnoses:  Chronic toe pain, left foot  Chronic osteomyelitis of foot, left   Pt is a 59 yo F with hx of DM, pAfib, CKD, hep C, chronic left foot osteomyeltitis, chronic bilateral lower extremity PVD, bipolar disorder, COPD, HTN who presents with chronic left foot pain and bilateral leg swelling.  Patient was admitted 3/29-3/30 due to left foot osteo and AKI.  He was advised to have a left toe amputation for the infection.  He unfortunately left AMA, but has been treated with antibiotics (amoxil and doxy per pt) for his left toe osteomyelitis.  States that he has follow up with orthopedics scheduled but hasn't completed this follow up due to "wanting to watch the masters gold tournament".   He has had bilateral leg pain and swelling for the past few years.  Reports that his legs hurt today, but no increase from baseline.  Reported to ED triage RN that his right leg hurt more than left this AM, but told this provider that his left leg was the one that was bothering him.  He denies any recent cough, SOB, fever, chest pain, increased weight gain.  He has been afebrile, denies drainage from his left toe infection, and denies pain.  Still ambulating on the extremity.  Reports that he was at his brothers house when the brother called EMS for himself, then this patient decided that he would get a ride to the ED for evaluation because "he was thinking about coming some day soon anyway."    Appears to have multiple  chronic problems, but no acute emergencies.  Patient is stable appearing.  Somewhat hypertensive but not emergently.  No fever.   Lower extremities with bilateral skin changes from chronic venous insufficiency.  No evidence of acute infection or cellulitis.  Left 2nd toe has an ulcer at the distal tip, with chronic skin hypertrophy and  changes around it, but no drainage, no erythema.  Appears stable.   Will obtain xray of left foot to see if there are any new changes.   Offered motrin for pain but patient refused.  Xray returned unchanged from previous.  Evidence of chronic 2nd phalanx infection but not worsening.  No indication that patient needs any additional emergent work up for his multitude of chronic problems.   Discussed with patient at length the importance of keeping his follow up.  He voiced aggravation that his PCP doesn't prescribe him narcotics and doesn't always give him the referrals he wants.  He was advised to keep all his appointments and provided with reassurance that the providers will treat him appropriately.  He was encouraged to take all his medications as previously prescribed.   RN able to schedule a follow up appointment with the ortho clinic for the patient prior to his discharge.  He was stressed to keep this appointment for evaluation of left toe osteo surgical treatment.    Patient was seen with ED Attending, Dr. Lorra Hals, MD   Tori Milks, MD 10/07/14 7341  Elnora Morrison, MD 10/10/14 1045

## 2014-10-13 ENCOUNTER — Emergency Department (HOSPITAL_COMMUNITY): Payer: Medicaid Other

## 2014-10-13 ENCOUNTER — Encounter (HOSPITAL_COMMUNITY): Payer: Self-pay | Admitting: Emergency Medicine

## 2014-10-13 ENCOUNTER — Ambulatory Visit: Payer: Medicaid Other | Attending: Internal Medicine | Admitting: Family Medicine

## 2014-10-13 ENCOUNTER — Emergency Department (HOSPITAL_COMMUNITY)
Admission: EM | Admit: 2014-10-13 | Discharge: 2014-10-13 | Disposition: A | Discharge status: Home / Self Care | Payer: Medicaid Other | Attending: Emergency Medicine | Admitting: Emergency Medicine

## 2014-10-13 VITALS — BP 121/69 | HR 118 | Temp 98.5°F | Resp 20

## 2014-10-13 DIAGNOSIS — Z85820 Personal history of malignant melanoma of skin: Secondary | ICD-10-CM | POA: Insufficient documentation

## 2014-10-13 DIAGNOSIS — Z862 Personal history of diseases of the blood and blood-forming organs and certain disorders involving the immune mechanism: Secondary | ICD-10-CM | POA: Diagnosis not present

## 2014-10-13 DIAGNOSIS — Z79899 Other long term (current) drug therapy: Secondary | ICD-10-CM | POA: Diagnosis not present

## 2014-10-13 DIAGNOSIS — I10 Essential (primary) hypertension: Secondary | ICD-10-CM | POA: Diagnosis not present

## 2014-10-13 DIAGNOSIS — Z792 Long term (current) use of antibiotics: Secondary | ICD-10-CM | POA: Diagnosis not present

## 2014-10-13 DIAGNOSIS — G8929 Other chronic pain: Secondary | ICD-10-CM | POA: Insufficient documentation

## 2014-10-13 DIAGNOSIS — F419 Anxiety disorder, unspecified: Secondary | ICD-10-CM | POA: Insufficient documentation

## 2014-10-13 DIAGNOSIS — J441 Chronic obstructive pulmonary disease with (acute) exacerbation: Secondary | ICD-10-CM | POA: Diagnosis not present

## 2014-10-13 DIAGNOSIS — F319 Bipolar disorder, unspecified: Secondary | ICD-10-CM | POA: Insufficient documentation

## 2014-10-13 DIAGNOSIS — R609 Edema, unspecified: Secondary | ICD-10-CM | POA: Insufficient documentation

## 2014-10-13 DIAGNOSIS — I878 Other specified disorders of veins: Secondary | ICD-10-CM

## 2014-10-13 DIAGNOSIS — Z8719 Personal history of other diseases of the digestive system: Secondary | ICD-10-CM | POA: Insufficient documentation

## 2014-10-13 DIAGNOSIS — J449 Chronic obstructive pulmonary disease, unspecified: Secondary | ICD-10-CM | POA: Diagnosis not present

## 2014-10-13 DIAGNOSIS — E039 Hypothyroidism, unspecified: Secondary | ICD-10-CM | POA: Diagnosis not present

## 2014-10-13 DIAGNOSIS — E785 Hyperlipidemia, unspecified: Secondary | ICD-10-CM | POA: Insufficient documentation

## 2014-10-13 DIAGNOSIS — R000 Tachycardia, unspecified: Secondary | ICD-10-CM | POA: Diagnosis not present

## 2014-10-13 DIAGNOSIS — Z794 Long term (current) use of insulin: Secondary | ICD-10-CM | POA: Diagnosis not present

## 2014-10-13 DIAGNOSIS — M1990 Unspecified osteoarthritis, unspecified site: Secondary | ICD-10-CM | POA: Diagnosis not present

## 2014-10-13 DIAGNOSIS — E119 Type 2 diabetes mellitus without complications: Secondary | ICD-10-CM | POA: Insufficient documentation

## 2014-10-13 DIAGNOSIS — M797 Fibromyalgia: Secondary | ICD-10-CM | POA: Diagnosis not present

## 2014-10-13 DIAGNOSIS — Z720 Tobacco use: Secondary | ICD-10-CM | POA: Insufficient documentation

## 2014-10-13 DIAGNOSIS — M069 Rheumatoid arthritis, unspecified: Secondary | ICD-10-CM | POA: Insufficient documentation

## 2014-10-13 DIAGNOSIS — R600 Localized edema: Secondary | ICD-10-CM

## 2014-10-13 DIAGNOSIS — R0602 Shortness of breath: Secondary | ICD-10-CM

## 2014-10-13 LAB — CBC WITH DIFFERENTIAL/PLATELET
Basophils Absolute: 0 10*3/uL (ref 0.0–0.1)
Basophils Relative: 0 % (ref 0–1)
Eosinophils Absolute: 0.4 10*3/uL (ref 0.0–0.7)
Eosinophils Relative: 4 % (ref 0–5)
HCT: 33 % — ABNORMAL LOW (ref 39.0–52.0)
Hemoglobin: 11.3 g/dL — ABNORMAL LOW (ref 13.0–17.0)
LYMPHS ABS: 2.8 10*3/uL (ref 0.7–4.0)
LYMPHS PCT: 30 % (ref 12–46)
MCH: 29.3 pg (ref 26.0–34.0)
MCHC: 34.2 g/dL (ref 30.0–36.0)
MCV: 85.5 fL (ref 78.0–100.0)
MONOS PCT: 9 % (ref 3–12)
Monocytes Absolute: 0.9 10*3/uL (ref 0.1–1.0)
Neutro Abs: 5.1 10*3/uL (ref 1.7–7.7)
Neutrophils Relative %: 57 % (ref 43–77)
PLATELETS: 233 10*3/uL (ref 150–400)
RBC: 3.86 MIL/uL — AB (ref 4.22–5.81)
RDW: 15.2 % (ref 11.5–15.5)
WBC: 9.2 10*3/uL (ref 4.0–10.5)

## 2014-10-13 LAB — BASIC METABOLIC PANEL
Anion gap: 9 (ref 5–15)
BUN: 23 mg/dL (ref 6–23)
CHLORIDE: 107 mmol/L (ref 96–112)
CO2: 20 mmol/L (ref 19–32)
Calcium: 7.3 mg/dL — ABNORMAL LOW (ref 8.4–10.5)
Creatinine, Ser: 2.56 mg/dL — ABNORMAL HIGH (ref 0.50–1.35)
GFR, EST AFRICAN AMERICAN: 30 mL/min — AB (ref >90)
GFR, EST NON AFRICAN AMERICAN: 26 mL/min — AB (ref >90)
Glucose, Bld: 156 mg/dL — ABNORMAL HIGH (ref 70–99)
POTASSIUM: 4 mmol/L (ref 3.5–5.1)
SODIUM: 136 mmol/L (ref 135–145)

## 2014-10-13 LAB — PROTIME-INR
INR: 3.73 — ABNORMAL HIGH (ref 0.00–1.49)
Prothrombin Time: 37.2 seconds — ABNORMAL HIGH (ref 11.6–15.2)

## 2014-10-13 LAB — URINE MICROSCOPIC-ADD ON

## 2014-10-13 LAB — BRAIN NATRIURETIC PEPTIDE: B Natriuretic Peptide: 124.1 pg/mL — ABNORMAL HIGH (ref 0.0–100.0)

## 2014-10-13 LAB — URINALYSIS, ROUTINE W REFLEX MICROSCOPIC
Bilirubin Urine: NEGATIVE
GLUCOSE, UA: 250 mg/dL — AB
Ketones, ur: NEGATIVE mg/dL
LEUKOCYTES UA: NEGATIVE
NITRITE: NEGATIVE
PH: 6 (ref 5.0–8.0)
Specific Gravity, Urine: 1.016 (ref 1.005–1.030)
UROBILINOGEN UA: 0.2 mg/dL (ref 0.0–1.0)

## 2014-10-13 MED ORDER — TIOTROPIUM BROMIDE MONOHYDRATE 18 MCG IN CAPS: 18.0000 ug | ORAL_CAPSULE | Freq: Every day | RESPIRATORY_TRACT | Status: DC

## 2014-10-13 MED ORDER — MORPHINE SULFATE 4 MG/ML IJ SOLN
8.0000 mg | Freq: Once | INTRAMUSCULAR | Status: AC
  Administered 2014-10-13: 8 mg via INTRAVENOUS
  Filled 2014-10-13: qty 2

## 2014-10-13 MED ORDER — SODIUM CHLORIDE 0.9 % IV SOLN
INTRAVENOUS | Status: DC
Start: 2014-10-13 — End: 2014-10-13
  Administered 2014-10-13: 20 mL/h via INTRAVENOUS

## 2014-10-13 NOTE — ED Notes (Signed)
Pt has swelling in hands, feet, and groin. States he noticed swelling in hands on Friday. Some redness to R posterior calf, states he noticed that this morning. States hx of cellulitis and infected toe, PCP plans to take infected toe off.

## 2014-10-13 NOTE — Progress Notes (Signed)
Scrotum edema Pain scare 8

## 2014-10-13 NOTE — Progress Notes (Signed)
Subjective:     Patient ID: Johnny Sims, male   DOB: 21-Oct-1955, 59 y.o.   MRN: [id number]  HPI   Patient presents after being in the ED this morning for swelling of his legs. He has a history of venous stasis and has cellulitis in the past. He says he was told in the ED that he should come here for a prescription for antibiotic and a prescription for potassium.  A review of his ED records does not reflect this. The exam performed by ED physicial does not show any signs of cellulits and his K+ is 4.0. This was this morning.  He has been seen by Mrs. Feliciana Rossetti here and is a difficult patient. He was told in ED that he was not pleased here he might want to find a Dr. Blanchard Kelch. Mrs. Feliciana Rossetti has asked that he be transferred to the care of Dr. Doreene Burke. He has multiple complaints but came here today expecting an antibiotic. He also asks for a refill of Spiriva. He denies SOB. His INR today was 3.75. His instructions from ED asks him to hold his coumadin for one day. He does have a cardiologist.   Review of Systems   See HPI     Objective:   Physical Exam   Patient is alert, oriented and appropriate. Skin is warm and dry. Lungs are clear, HS are regular w/o m,g,r. He has 3+ pitting edema from ankle to top of thigh and there is some edema of the scrotum. The skin of his lower legs is tight, rough and discolored but I see no evidence for cellulitis currently.     Assessment:     Venous Stasis with lower leg edema. COPD    Plan:     I have suggested he call his cardiologist and let him know what is going on both with his legs and with INR. I have explained that I see no evidence for cellulitis at this time. I have suggested he make an appointment here with Dr. Doreene Burke or elsewhere with an primary physician, as he seems not to be happy here. I have e-prescribed a refill on his Spiriva to [facility]

## 2014-10-13 NOTE — Discharge Instructions (Signed)
Hold your Coumadin dose for 1 day. Follow-up at the [facility] today Edema You have swelling in your legs (peripheral edema). This swelling is due to excess accumulation of salt and water in your body. Edema may be a sign of heart, kidney or liver disease, or a side effect of a medication. It may also be due to problems in the leg veins. Elevating your legs and using special support stockings may be very helpful, if the cause of the swelling is due to poor venous circulation. Avoid long periods of standing, whatever the cause. Treatment of edema depends on identifying the cause. Chips, pretzels, pickles and other salty foods should be avoided. Restricting salt in your diet is almost always needed. Water pills (diuretics) are often used to remove the excess salt and water from your body via urine. These medicines prevent the kidney from reabsorbing sodium. This increases urine flow. Diuretic treatment may also result in lowering of potassium levels in your body. Potassium supplements may be needed if you have to use diuretics daily. Daily weights can help you keep track of your progress in clearing your edema. You should call your caregiver for follow up care as recommended. SEEK IMMEDIATE MEDICAL CARE IF:   You have increased swelling, pain, redness, or heat in your legs.  You develop shortness of breath, especially when lying down.  You develop chest or abdominal pain, weakness, or fainting.  You have a fever. Document Released: 07/21/2004 Document Revised: 09/05/2011 Document Reviewed: 07/01/2009 [facility] Patient Information 2015 [place], [place]. This information is not intended to replace advice given to you by your health care provider. Make sure you discuss any questions you have with your health care provider.

## 2014-10-13 NOTE — ED Notes (Signed)
Patient transported to [facility] 

## 2014-10-13 NOTE — ED Provider Notes (Addendum)
CSN: [id number]     Arrival date & time 10/13/14  0645 History   First MD Initiated Contact with Patient 10/13/14 [phone]     Chief Complaint  Patient presents with  . Edema     (Consider location/radiation/quality/duration/timing/severity/associated sxs/prior Treatment) HPI Comments: Pt here with worsening chronic pain from edema in bilat LE.sx progressive x 3 days. Edema now extends to his groin with some bilat hand swelling--denies sob or chest pain, no dysuria or hematuria--has h/o chronic afib and cki--sx persistent and are worse with actvitiy, nothing makes them better--no new tx used pta  The history is provided by the patient.    Past Medical History  Diagnosis Date  . PAF (paroxysmal atrial fibrillation)   . Diabetes mellitus   . Hypothyroidism   . Hyperlipidemia   . Hypertension   . Chronic pain   . OA (osteoarthritis)   . COPD (chronic obstructive pulmonary disease)   . RA (rheumatoid arthritis)   . Hepatitis C   . Venous stasis   . Bipolar 1 disorder   . Hepatitis C   . Fibromyalgia   . Dysrhythmia     PAF  . Asthma   . Anxiety   . Melanoma     face, shoulder arm  . Pancreatitis, acute 2015   Past Surgical History  Procedure Laterality Date  . Thyroidectomy    . Tonsillectomy    . US echocardiography  02/19/2010    EF 55-60%  . Skin cancer excision      melonoma  . Fracture surgery Left     arm  . Submandibular gland excision Left 12/25/2013    w/sialolithotomy  . Inguinal hernia repair Bilateral   . Hernia repair      umbicial  . Knee surgery      /H&P 02/20/2010  . Submandibular gland excision Left 12/25/2013    Procedure: SUBMANDIBULAR GLAND STONE AND CHRONIC SIALOLITHIASIS EXCISION;  Surgeon: Melida Quitter, MD;  Location: [facility] OR;  Service: ENT;  Laterality: Left;   Family History  Problem Relation Age of Onset  . Hypertension Mother   . Alzheimer's disease Mother   . Heart disease Father    History  Substance Use Topics  . Smoking status:  Current Every Day Smoker -- 1.00 packs/day for 43 years  . Smokeless tobacco: Not on file  . Alcohol Use: 0.6 oz/week    1 Cans of beer per week    Review of Systems  All other systems reviewed and are negative.     Allergies  Oxycodone hcl; Antihistamines, chlorpheniramine-type; Iodinated diagnostic agents; Lidocaine; Methadone; Novolog; Other; Pentazocine lactate; Sulfa antibiotics; and Vicodin  Home Medications   Prior to Admission medications   Medication Sig Start Date End Date Taking? Authorizing Provider  amLODipine (NORVASC) 5 MG tablet Take 5 mg by mouth daily.   Yes Historical Provider, MD  cholecalciferol (VITAMIN D) 1000 UNITS tablet Take 2,000 Units by mouth every evening.    Yes Historical Provider, MD  COUMADIN 10 MG tablet Take 1 tablet (10 mg total) by mouth as directed. Patient taking differently: Take 10 mg by mouth as directed. Monday, Wednesday, Friday and Saturday - alternating with 7.5mg  08/20/14  Yes Thayer Headings, MD  COUMADIN 7.5 MG tablet Take 1 tablet (7.5 mg total) by mouth as directed. Patient taking differently: Take 7.5 mg by mouth as directed. Sunday, Tuesday, Thursday - alternating with 10mg  08/20/14  Yes Thayer Headings, MD  diazepam (VALIUM) 10 MG tablet Take 10 mg  by mouth 3 (three) times daily. For anxiety   Yes Historical Provider, MD  fluticasone (FLONASE) 50 MCG/ACT nasal spray Place 1 spray into both nostrils daily as needed for allergies.    Yes Historical Provider, MD  insulin glargine (LANTUS) 100 unit/mL SOPN Inject 0.27 mLs (27 Units total) into the skin at bedtime. PEN Patient taking differently: Inject 28 Units into the skin at bedtime. PEN 08/16/14  Yes Britt Bottom, NP  Insulin Pen Needle (SURE COMFORT PEN NEEDLES) 31G X 8 MM MISC Use insulin once nightly for E11.9 08/28/14  Yes Lance Bosch, NP  levothyroxine (SYNTHROID, LEVOTHROID) 88 MCG tablet Take 1 tablet (88 mcg total) by mouth daily. 08/28/14  Yes Lance Bosch, NP   lisinopril (PRINIVIL,ZESTRIL) 40 MG tablet Take 40 mg by mouth every morning.  07/25/14  Yes Historical Provider, MD  loratadine (CLARITIN) 10 MG tablet Take 1 tablet (10 mg total) by mouth daily as needed for allergies. 08/16/14  Yes Britt Bottom, NP  metoprolol (LOPRESSOR) 50 MG tablet Take 1 tablet (50 mg total) by mouth daily. 08/28/14  Yes Lance Bosch, NP  Multiple Vitamin (MULTIVITAMIN) tablet Take 1 tablet by mouth every morning.    Yes Historical Provider, MD  oxyCODONE (ROXICODONE) 30 MG immediate release tablet Take 1 tablet (30 mg total) by mouth 5 (five) times daily. 12/26/13  Yes Melida Quitter, MD  simvastatin (ZOCOR) 20 MG tablet Take 1 tablet (20 mg total) by mouth daily. 08/28/14  Yes Lance Bosch, NP  amoxicillin-clavulanate (AUGMENTIN) 875-125 MG per tablet Take 1 tablet by mouth 2 (two) times daily. Patient not taking: Reported on 10/13/2014 09/24/14   Reyne Dumas, MD  doxycycline (VIBRAMYCIN) 50 MG capsule Take 2 capsules (100 mg total) by mouth 2 (two) times daily. Patient not taking: Reported on 10/13/2014 09/24/14   Reyne Dumas, MD  nicotine (NICODERM CQ - DOSED IN MG/24 HOURS) 21 mg/24hr patch Place 1 patch (21 mg total) onto the skin daily. Patient not taking: Reported on 10/13/2014 09/24/14   Reyne Dumas, MD  omeprazole (PRILOSEC) 40 MG capsule Take 1 capsule (40 mg total) by mouth daily. Patient not taking: Reported on 10/13/2014 08/28/14   Lance Bosch, NP  simvastatin (ZOCOR) 20 MG tablet Take 1 tablet (20 mg total) by mouth every evening. Patient not taking: Reported on 10/13/2014 08/16/14   Britt Bottom, NP  tiotropium (SPIRIVA) 18 MCG inhalation capsule Place 1 capsule (18 mcg total) into inhaler and inhale daily. Patient not taking: Reported on 10/13/2014 08/16/14   Britt Bottom, NP   BP 123/88 mmHg  Pulse 117  Temp(Src) 98.4 F (36.9 C) (Oral)  Resp 14  SpO2 99% Physical Exam  Constitutional: He is oriented to person, place, and time. He appears  well-developed and well-nourished.  Non-toxic appearance. No distress.  HENT:  Head: Normocephalic and atraumatic.  Eyes: Conjunctivae, EOM and lids are normal. Pupils are equal, round, and reactive to light.  Neck: Normal range of motion. Neck supple. No tracheal deviation present. No thyroid mass present.  Cardiovascular: Normal heart sounds.  An irregular rhythm present. Tachycardia present.  Exam reveals no gallop.   No murmur heard. Pulmonary/Chest: Effort normal and breath sounds normal. No stridor. No respiratory distress. He has no decreased breath sounds. He has no wheezes. He has no rhonchi. He has no rales.  Abdominal: Soft. Normal appearance and bowel sounds are normal. He exhibits no distension. There is no tenderness. There is no rebound and no CVA tenderness.  Musculoskeletal: Normal range of motion. He exhibits no edema or tenderness.  Bilateral le edema 3 plus, left foot with ulcer on 2nd toe,  No erythema or drainage  Neurological: He is alert and oriented to person, place, and time. He has normal strength. No cranial nerve deficit or sensory deficit. GCS eye subscore is 4. GCS verbal subscore is 5. GCS motor subscore is 6.  Skin: Skin is warm and dry. No abrasion and no rash noted.  Psychiatric: He has a normal mood and affect. His speech is normal and behavior is normal.  Nursing note and vitals reviewed.   ED Course  Procedures (including critical care time) Labs Review Labs Reviewed  PROTIME-INR  CBC WITH DIFFERENTIAL/PLATELET  BASIC METABOLIC PANEL  URINALYSIS, ROUTINE W REFLEX MICROSCOPIC    Imaging Review No results found.   EKG Interpretation   Date/Time:  Monday October 13 2014 07:34:25 EDT Ventricular Rate:  129 PR Interval:    QRS Duration: 85 QT Interval:  321 QTC Calculation: 470 R Axis:   65 Text Interpretation:  Atrial fibrillation Confirmed by Salli Bodin  MD, Kayvan Hoefling  ([id number]) on 10/13/2014 7:50:42 AM      MDM   Final diagnoses:  None     Patient given pain meds here feels better. Patients swelling is bilateral and doubt that this represents DVT. INR is supratherapeutic. Will inform patient hold his Coumadin for 1 day. We'll go to the [employer] from here today for education adjustment    Lacretia Leigh, MD 10/13/14 1015  Lacretia Leigh, MD 10/13/14 1016  Lacretia Leigh, MD 10/13/14 1017

## 2014-10-13 NOTE — Patient Instructions (Addendum)
Make an appointment here with Dr. Doreene Burke or Dr. Eino Farber for next available appointment. Elevate legs when you are sitting or lying down I have sent in prescription for spiriva. In ED they asked you to hold your coumadin for one day.

## 2014-10-15 ENCOUNTER — Emergency Department (HOSPITAL_COMMUNITY): Payer: Medicaid Other

## 2014-10-15 ENCOUNTER — Ambulatory Visit: Payer: Medicaid Other | Admitting: Internal Medicine

## 2014-10-15 ENCOUNTER — Emergency Department (HOSPITAL_COMMUNITY)
Admission: EM | Admit: 2014-10-15 | Discharge: 2014-10-15 | Disposition: A | Discharge status: Home / Self Care | Payer: Medicaid Other | Attending: Emergency Medicine | Admitting: Emergency Medicine

## 2014-10-15 ENCOUNTER — Encounter (HOSPITAL_COMMUNITY): Payer: Self-pay | Admitting: Emergency Medicine

## 2014-10-15 DIAGNOSIS — R2243 Localized swelling, mass and lump, lower limb, bilateral: Secondary | ICD-10-CM | POA: Diagnosis not present

## 2014-10-15 DIAGNOSIS — Z7951 Long term (current) use of inhaled steroids: Secondary | ICD-10-CM | POA: Diagnosis not present

## 2014-10-15 DIAGNOSIS — E785 Hyperlipidemia, unspecified: Secondary | ICD-10-CM | POA: Insufficient documentation

## 2014-10-15 DIAGNOSIS — Z8739 Personal history of other diseases of the musculoskeletal system and connective tissue: Secondary | ICD-10-CM | POA: Insufficient documentation

## 2014-10-15 DIAGNOSIS — E119 Type 2 diabetes mellitus without complications: Secondary | ICD-10-CM | POA: Diagnosis not present

## 2014-10-15 DIAGNOSIS — Z794 Long term (current) use of insulin: Secondary | ICD-10-CM | POA: Insufficient documentation

## 2014-10-15 DIAGNOSIS — Z8619 Personal history of other infectious and parasitic diseases: Secondary | ICD-10-CM | POA: Diagnosis not present

## 2014-10-15 DIAGNOSIS — Z8719 Personal history of other diseases of the digestive system: Secondary | ICD-10-CM | POA: Insufficient documentation

## 2014-10-15 DIAGNOSIS — Z8669 Personal history of other diseases of the nervous system and sense organs: Secondary | ICD-10-CM | POA: Insufficient documentation

## 2014-10-15 DIAGNOSIS — Z85820 Personal history of malignant melanoma of skin: Secondary | ICD-10-CM | POA: Insufficient documentation

## 2014-10-15 DIAGNOSIS — E039 Hypothyroidism, unspecified: Secondary | ICD-10-CM | POA: Diagnosis not present

## 2014-10-15 DIAGNOSIS — M7989 Other specified soft tissue disorders: Secondary | ICD-10-CM | POA: Diagnosis present

## 2014-10-15 DIAGNOSIS — J449 Chronic obstructive pulmonary disease, unspecified: Secondary | ICD-10-CM | POA: Diagnosis not present

## 2014-10-15 DIAGNOSIS — G8929 Other chronic pain: Secondary | ICD-10-CM | POA: Diagnosis not present

## 2014-10-15 DIAGNOSIS — Z720 Tobacco use: Secondary | ICD-10-CM | POA: Insufficient documentation

## 2014-10-15 DIAGNOSIS — N508 Other specified disorders of male genital organs: Secondary | ICD-10-CM | POA: Diagnosis not present

## 2014-10-15 DIAGNOSIS — Z8659 Personal history of other mental and behavioral disorders: Secondary | ICD-10-CM | POA: Diagnosis not present

## 2014-10-15 DIAGNOSIS — Z79899 Other long term (current) drug therapy: Secondary | ICD-10-CM | POA: Diagnosis not present

## 2014-10-15 DIAGNOSIS — R600 Localized edema: Secondary | ICD-10-CM

## 2014-10-15 LAB — BRAIN NATRIURETIC PEPTIDE: B NATRIURETIC PEPTIDE 5: 181.5 pg/mL — AB (ref 0.0–100.0)

## 2014-10-15 LAB — COMPREHENSIVE METABOLIC PANEL
ALBUMIN: 1.6 g/dL — AB (ref 3.5–5.2)
ALT: 19 U/L (ref 0–53)
AST: 26 U/L (ref 0–37)
Alkaline Phosphatase: 43 U/L (ref 39–117)
Anion gap: 9 (ref 5–15)
BUN: 23 mg/dL (ref 6–23)
CALCIUM: 7.5 mg/dL — AB (ref 8.4–10.5)
CO2: 21 mmol/L (ref 19–32)
Chloride: 107 mmol/L (ref 96–112)
Creatinine, Ser: 2.35 mg/dL — ABNORMAL HIGH (ref 0.50–1.35)
GFR calc non Af Amer: 29 mL/min — ABNORMAL LOW (ref >90)
GFR, EST AFRICAN AMERICAN: 33 mL/min — AB (ref >90)
GLUCOSE: 111 mg/dL — AB (ref 70–99)
POTASSIUM: 3.8 mmol/L (ref 3.5–5.1)
Sodium: 137 mmol/L (ref 135–145)
Total Bilirubin: 0.4 mg/dL (ref 0.3–1.2)
Total Protein: 4.7 g/dL — ABNORMAL LOW (ref 6.0–8.3)

## 2014-10-15 LAB — CBC WITH DIFFERENTIAL/PLATELET
BASOS PCT: 0 % (ref 0–1)
Basophils Absolute: 0 10*3/uL (ref 0.0–0.1)
Eosinophils Absolute: 0.3 10*3/uL (ref 0.0–0.7)
Eosinophils Relative: 4 % (ref 0–5)
HCT: 32 % — ABNORMAL LOW (ref 39.0–52.0)
HEMOGLOBIN: 10.9 g/dL — AB (ref 13.0–17.0)
Lymphocytes Relative: 30 % (ref 12–46)
Lymphs Abs: 2.8 10*3/uL (ref 0.7–4.0)
MCH: 28.9 pg (ref 26.0–34.0)
MCHC: 34.1 g/dL (ref 30.0–36.0)
MCV: 84.9 fL (ref 78.0–100.0)
Monocytes Absolute: 0.9 10*3/uL (ref 0.1–1.0)
Monocytes Relative: 9 % (ref 3–12)
NEUTROS ABS: 5.4 10*3/uL (ref 1.7–7.7)
NEUTROS PCT: 57 % (ref 43–77)
Platelets: 241 10*3/uL (ref 150–400)
RBC: 3.77 MIL/uL — ABNORMAL LOW (ref 4.22–5.81)
RDW: 15 % (ref 11.5–15.5)
WBC: 9.3 10*3/uL (ref 4.0–10.5)

## 2014-10-15 LAB — PROTIME-INR
INR: 1.55 — AB (ref 0.00–1.49)
Prothrombin Time: 18.7 seconds — ABNORMAL HIGH (ref 11.6–15.2)

## 2014-10-15 LAB — TROPONIN I

## 2014-10-15 MED ORDER — FUROSEMIDE 40 MG PO TABS
40.0000 mg | ORAL_TABLET | Freq: Every day | ORAL | Status: DC
Start: 2014-10-15 — End: 2014-11-05

## 2014-10-15 MED ORDER — MORPHINE SULFATE 4 MG/ML IJ SOLN
4.0000 mg | Freq: Once | INTRAMUSCULAR | Status: AC
  Administered 2014-10-15: 4 mg via INTRAVENOUS
  Filled 2014-10-15: qty 1

## 2014-10-15 MED ORDER — FUROSEMIDE 10 MG/ML IJ SOLN
40.0000 mg | Freq: Once | INTRAMUSCULAR | Status: AC
  Administered 2014-10-15: 40 mg via INTRAVENOUS
  Filled 2014-10-15: qty 4

## 2014-10-15 MED ORDER — POTASSIUM CHLORIDE ER 10 MEQ PO TBCR: 20.0000 meq | EXTENDED_RELEASE_TABLET | Freq: Every day | ORAL | Status: DC

## 2014-10-15 NOTE — ED Notes (Signed)
Pt c/o swelling in BLE and scrotum.   Pt denies SOB, Chest pain. Pt endorses N/V yesterday afternoon, denies diarrhea.  +3 pitting edema noted BLE.  NAD noted at this time.  Pt states  walked here.

## 2014-10-15 NOTE — Discharge Instructions (Signed)
Lasix and potassium replacement as prescribed.  Follow-up with your cardiologist on Monday as scheduled, and return to the ER if your symptoms significantly worsen or change.   Edema Edema is an abnormal buildup of fluids in your bodytissues. Edema is somewhatdependent on gravity to pull the fluid to the lowest place in your body. That makes the condition more common in the legs and thighs (lower extremities). Painless swelling of the feet and ankles is common and becomes more likely as you get older. It is also common in looser tissues, like around your eyes.  When the affected area is squeezed, the fluid may move out of that spot and leave a dent for a few moments. This dent is called pitting.  CAUSES  There are many possible causes of edema. Eating too much salt and being on your feet or sitting for a long time can cause edema in your legs and ankles. Hot weather may make edema worse. Common medical causes of edema include:  Heart failure.  Liver disease.  Kidney disease.  Weak blood vessels in your legs.  Cancer.  An injury.  Pregnancy.  Some medications.  Obesity. SYMPTOMS  Edema is usually painless.Your skin may look swollen or shiny.  DIAGNOSIS  Your health care provider may be able to diagnose edema by asking about your medical history and doing a physical exam. You may need to have tests such as X-rays, an electrocardiogram, or blood tests to check for medical conditions that may cause edema.  TREATMENT  Edema treatment depends on the cause. If you have heart, liver, or kidney disease, you need the treatment appropriate for these conditions. General treatment may include:  Elevation of the affected body part above the level of your heart.  Compression of the affected body part. Pressure from elastic bandages or support stockings squeezes the tissues and forces fluid back into the blood vessels. This keeps fluid from entering the tissues.  Restriction of fluid and  salt intake.  Use of a water pill (diuretic). These medications are appropriate only for some types of edema. They pull fluid out of your body and make you urinate more often. This gets rid of fluid and reduces swelling, but diuretics can have side effects. Only use diuretics as directed by your health care provider. HOME CARE INSTRUCTIONS   Keep the affected body part above the level of your heart when you are lying down.   Do not sit still or stand for prolonged periods.   Do not put anything directly under your knees when lying down.  Do not wear constricting clothing or garters on your upper legs.   Exercise your legs to work the fluid back into your blood vessels. This may help the swelling go down.   Wear elastic bandages or support stockings to reduce ankle swelling as directed by your health care provider.   Eat a low-salt diet to reduce fluid if your health care provider recommends it.   Only take medicines as directed by your health care provider. SEEK MEDICAL CARE IF:   Your edema is not responding to treatment.  You have heart, liver, or kidney disease and notice symptoms of edema.  You have edema in your legs that does not improve after elevating them.   You have sudden and unexplained weight gain. SEEK IMMEDIATE MEDICAL CARE IF:   You develop shortness of breath or chest pain.   You cannot breathe when you lie down.  You develop pain, redness, or warmth in the  swollen areas.   You have heart, liver, or kidney disease and suddenly get edema.  You have a fever and your symptoms suddenly get worse. MAKE SURE YOU:   Understand these instructions.  Will watch your condition.  Will get help right away if you are not doing well or get worse. Document Released: 06/13/2005 Document Revised: 10/28/2013 Document Reviewed: 04/05/2013 [facility] Patient Information 2015 [place], [place]. This information is not intended to replace advice given to you by your  health care provider. Make sure you discuss any questions you have with your health care provider.

## 2014-10-15 NOTE — ED Provider Notes (Signed)
CSN: [id number]     Arrival date & time 10/15/14  0430 History   First MD Initiated Contact with Patient 10/15/14 0440     Chief Complaint  Patient presents with  . Testicle Pain  . Leg Swelling     (Consider location/radiation/quality/duration/timing/severity/associated sxs/prior Treatment) HPI Comments: Patient is a 59 year old male with history of atrial fibrillation, chronic renal insufficiency, diabetes, COPD. He presents for evaluation of bilateral lower extremity pain and swelling that has worsened over the past several weeks. He was seen here 2 days ago for similar complaints. His workup revealed a mildly elevated BNP but was otherwise unremarkable. He was discharged with instructions to [facility] with [facility]. He presents here again stating that his swelling has increased and his discomfort has increased as well. He denies any chest pain. He feels somewhat short of breath but denies cough, or fever.  Patient is a 59 y.o. male presenting with leg pain. The history is provided by the patient.  Leg Pain Location:  Ankle Time since incident:  3 weeks Injury: no   Pain details:    Radiates to:  Does not radiate   Severity:  Severe   Onset quality:  Gradual   Duration:  3 weeks   Timing:  Constant   Progression:  Worsening Chronicity:  New Relieved by:  Nothing Worsened by:  Nothing tried Ineffective treatments:  None tried   Past Medical History  Diagnosis Date  . PAF (paroxysmal atrial fibrillation)   . Diabetes mellitus   . Hypothyroidism   . Hyperlipidemia   . Hypertension   . Chronic pain   . OA (osteoarthritis)   . COPD (chronic obstructive pulmonary disease)   . RA (rheumatoid arthritis)   . Hepatitis C   . Venous stasis   . Bipolar 1 disorder   . Hepatitis C   . Fibromyalgia   . Dysrhythmia     PAF  . Asthma   . Anxiety   . Melanoma     face, shoulder arm  . Pancreatitis, acute 2015   Past Surgical History  Procedure Laterality Date  .  Thyroidectomy    . Tonsillectomy    . US echocardiography  02/19/2010    EF 55-60%  . Skin cancer excision      melonoma  . Fracture surgery Left     arm  . Submandibular gland excision Left 12/25/2013    w/sialolithotomy  . Inguinal hernia repair Bilateral   . Hernia repair      umbicial  . Knee surgery      /H&P 02/20/2010  . Submandibular gland excision Left 12/25/2013    Procedure: SUBMANDIBULAR GLAND STONE AND CHRONIC SIALOLITHIASIS EXCISION;  Surgeon: Melida Quitter, MD;  Location: [facility] OR;  Service: ENT;  Laterality: Left;   Family History  Problem Relation Age of Onset  . Hypertension Mother   . Alzheimer's disease Mother   . Heart disease Father    History  Substance Use Topics  . Smoking status: Current Every Day Smoker -- 1.00 packs/day for 43 years  . Smokeless tobacco: Not on file  . Alcohol Use: 0.6 oz/week    1 Cans of beer per week    Review of Systems  All other systems reviewed and are negative.     Allergies  Oxycodone hcl; Antihistamines, chlorpheniramine-type; Iodinated diagnostic agents; Lidocaine; Methadone; Novolog; Other; Pentazocine lactate; Sulfa antibiotics; and Vicodin  Home Medications   Prior to Admission medications   Medication Sig Start Date End Date  Taking? Authorizing Provider  amLODipine (NORVASC) 5 MG tablet Take 5 mg by mouth daily.    Historical Provider, MD  amoxicillin-clavulanate (AUGMENTIN) 875-125 MG per tablet Take 1 tablet by mouth 2 (two) times daily. Patient not taking: Reported on 10/13/2014 09/24/14   Reyne Dumas, MD  cholecalciferol (VITAMIN D) 1000 UNITS tablet Take 2,000 Units by mouth every evening.     Historical Provider, MD  COUMADIN 10 MG tablet Take 1 tablet (10 mg total) by mouth as directed. Patient taking differently: Take 10 mg by mouth as directed. Monday, Wednesday, Friday and Saturday - alternating with 7.5mg  08/20/14   Thayer Headings, MD  COUMADIN 7.5 MG tablet Take 1 tablet (7.5 mg total) by mouth as  directed. Patient taking differently: Take 7.5 mg by mouth as directed. Sunday, Tuesday, Thursday - alternating with 10mg  08/20/14   Thayer Headings, MD  diazepam (VALIUM) 10 MG tablet Take 10 mg by mouth 3 (three) times daily. For anxiety    Historical Provider, MD  doxycycline (VIBRAMYCIN) 50 MG capsule Take 2 capsules (100 mg total) by mouth 2 (two) times daily. Patient not taking: Reported on 10/13/2014 09/24/14   Reyne Dumas, MD  fluticasone (FLONASE) 50 MCG/ACT nasal spray Place 1 spray into both nostrils daily as needed for allergies.     Historical Provider, MD  insulin glargine (LANTUS) 100 unit/mL SOPN Inject 0.27 mLs (27 Units total) into the skin at bedtime. PEN Patient taking differently: Inject 28 Units into the skin at bedtime. PEN 08/16/14   Britt Bottom, NP  Insulin Pen Needle (SURE COMFORT PEN NEEDLES) 31G X 8 MM MISC Use insulin once nightly for E11.9 08/28/14   Lance Bosch, NP  levothyroxine (SYNTHROID, LEVOTHROID) 88 MCG tablet Take 1 tablet (88 mcg total) by mouth daily. 08/28/14   Lance Bosch, NP  lisinopril (PRINIVIL,ZESTRIL) 40 MG tablet Take 40 mg by mouth every morning.  07/25/14   Historical Provider, MD  loratadine (CLARITIN) 10 MG tablet Take 1 tablet (10 mg total) by mouth daily as needed for allergies. 08/16/14   Britt Bottom, NP  metoprolol (LOPRESSOR) 50 MG tablet Take 1 tablet (50 mg total) by mouth daily. 08/28/14   Lance Bosch, NP  Multiple Vitamin (MULTIVITAMIN) tablet Take 1 tablet by mouth every morning.     Historical Provider, MD  nicotine (NICODERM CQ - DOSED IN MG/24 HOURS) 21 mg/24hr patch Place 1 patch (21 mg total) onto the skin daily. Patient not taking: Reported on 10/13/2014 09/24/14   Reyne Dumas, MD  omeprazole (PRILOSEC) 40 MG capsule Take 1 capsule (40 mg total) by mouth daily. Patient not taking: Reported on 10/13/2014 08/28/14   Lance Bosch, NP  oxyCODONE (ROXICODONE) 30 MG immediate release tablet Take 1 tablet (30 mg total) by mouth  5 (five) times daily. 12/26/13   Melida Quitter, MD  simvastatin (ZOCOR) 20 MG tablet Take 1 tablet (20 mg total) by mouth every evening. Patient not taking: Reported on 10/13/2014 08/16/14   Britt Bottom, NP  simvastatin (ZOCOR) 20 MG tablet Take 1 tablet (20 mg total) by mouth daily. 08/28/14   Lance Bosch, NP  tiotropium (SPIRIVA) 18 MCG inhalation capsule Place 1 capsule (18 mcg total) into inhaler and inhale daily. 10/13/14   Micheline Chapman, NP   BP 153/94 mmHg  Pulse 110  Temp(Src) 99.5 F (37.5 C) (Oral)  Resp 16  Ht 6\' 9"  (2.057 m)  Wt 326 lb (147.873 kg)  BMI 34.95 kg/m2  SpO2 98% Physical Exam  Constitutional: He is oriented to person, place, and time. He appears well-developed and well-nourished. No distress.  HENT:  Head: Normocephalic and atraumatic.  Neck: Normal range of motion. Neck supple.  Cardiovascular: Normal rate, regular rhythm and normal heart sounds.   No murmur heard. Pulmonary/Chest: Effort normal. No respiratory distress. He has no wheezes. He has rales.  There are slight rales in the bases bilaterally.  Abdominal: Soft. Bowel sounds are normal. He exhibits no distension. There is no tenderness.  Musculoskeletal: Normal range of motion. He exhibits edema.  There is 2-3+ pitting edema of the bilateral lower extremities. There is tenderness to palpation.  Neurological: He is alert and oriented to person, place, and time.  Skin: Skin is warm and dry. He is not diaphoretic.  Nursing note and vitals reviewed.   ED Course  Procedures (including critical care time) Labs Review Labs Reviewed  COMPREHENSIVE METABOLIC PANEL  CBC WITH DIFFERENTIAL/PLATELET  TROPONIN I  PROTIME-INR  BRAIN NATRIURETIC PEPTIDE    Imaging Review Dg Chest 2 View  10/13/2014   CLINICAL DATA:  COPD  EXAM: CHEST  2 VIEW  COMPARISON:  12/25/2013  FINDINGS: Cardiac shadow is stable. The lungs are well aerated bilaterally. No focal infiltrate or sizable effusion is seen. No acute  bony abnormality is noted.  IMPRESSION: No active cardiopulmonary disease.   Electronically Signed   By: Inez Catalina M.D.   On: 10/13/2014 07:58     EKG Interpretation   Date/Time:  Wednesday October 15 2014 04:42:12 EDT Ventricular Rate:  110 PR Interval:  206 QRS Duration: 94 QT Interval:  334 QTC Calculation: 452 R Axis:   70 Text Interpretation:  Sinus tachycardia Atrial premature complexes  Prolonged PR interval Confirmed by Beau Fanny  MD, Omran Keelin ([id number]) on 10/15/2014  5:39:21 AM      MDM   Final diagnoses:  None    Patient is a 59 year old male who presents with complaints of increased leg swelling and weight gain for the past few weeks. He was seen here 2 days ago and told that he had edema. He attempted to follow-up as an outpatient, however could not obtain this appointment.  Over the course of the past month he has gained 25 pounds and has increased edema in his legs. Today's workup reveals no evidence for CHF on his chest x-[place] and his BNP is only mildly elevated. He was given pain medication and IV Lasix and is now feeling significantly improved. He will be discharged with a dose of Lasix and follow-up with his primary Dr. He also has a follow-up appointment with his cardiologist on Monday and I have advised him to keep this appointment as well.    Veryl Speak, MD 10/15/14 [phone]

## 2014-10-17 ENCOUNTER — Other Ambulatory Visit: Payer: Self-pay | Admitting: Internal Medicine

## 2014-10-17 DIAGNOSIS — E119 Type 2 diabetes mellitus without complications: Secondary | ICD-10-CM

## 2014-10-21 ENCOUNTER — Ambulatory Visit: Payer: Medicaid Other | Admitting: Internal Medicine

## 2014-10-22 ENCOUNTER — Ambulatory Visit: Payer: Medicaid Other | Admitting: Cardiovascular Disease

## 2014-10-31 ENCOUNTER — Other Ambulatory Visit: Payer: Self-pay | Admitting: Internal Medicine

## 2014-11-03 ENCOUNTER — Ambulatory Visit: Payer: Medicaid Other | Admitting: Internal Medicine

## 2014-11-05 ENCOUNTER — Encounter (HOSPITAL_COMMUNITY): Payer: Self-pay | Admitting: Emergency Medicine

## 2014-11-05 ENCOUNTER — Emergency Department (HOSPITAL_COMMUNITY)
Admission: EM | Admit: 2014-11-05 | Discharge: 2014-11-05 | Disposition: A | Discharge status: Home / Self Care | Payer: Medicaid Other | Attending: Emergency Medicine | Admitting: Emergency Medicine

## 2014-11-05 DIAGNOSIS — M069 Rheumatoid arthritis, unspecified: Secondary | ICD-10-CM | POA: Insufficient documentation

## 2014-11-05 DIAGNOSIS — E039 Hypothyroidism, unspecified: Secondary | ICD-10-CM | POA: Insufficient documentation

## 2014-11-05 DIAGNOSIS — Z79899 Other long term (current) drug therapy: Secondary | ICD-10-CM | POA: Insufficient documentation

## 2014-11-05 DIAGNOSIS — Z8619 Personal history of other infectious and parasitic diseases: Secondary | ICD-10-CM | POA: Diagnosis not present

## 2014-11-05 DIAGNOSIS — Z8719 Personal history of other diseases of the digestive system: Secondary | ICD-10-CM | POA: Diagnosis not present

## 2014-11-05 DIAGNOSIS — E119 Type 2 diabetes mellitus without complications: Secondary | ICD-10-CM | POA: Diagnosis not present

## 2014-11-05 DIAGNOSIS — M797 Fibromyalgia: Secondary | ICD-10-CM | POA: Insufficient documentation

## 2014-11-05 DIAGNOSIS — Z794 Long term (current) use of insulin: Secondary | ICD-10-CM | POA: Insufficient documentation

## 2014-11-05 DIAGNOSIS — G8929 Other chronic pain: Secondary | ICD-10-CM | POA: Diagnosis not present

## 2014-11-05 DIAGNOSIS — F319 Bipolar disorder, unspecified: Secondary | ICD-10-CM | POA: Diagnosis not present

## 2014-11-05 DIAGNOSIS — I480 Paroxysmal atrial fibrillation: Secondary | ICD-10-CM | POA: Diagnosis not present

## 2014-11-05 DIAGNOSIS — E8779 Other fluid overload: Secondary | ICD-10-CM | POA: Insufficient documentation

## 2014-11-05 DIAGNOSIS — Z720 Tobacco use: Secondary | ICD-10-CM | POA: Diagnosis not present

## 2014-11-05 DIAGNOSIS — Z7951 Long term (current) use of inhaled steroids: Secondary | ICD-10-CM | POA: Insufficient documentation

## 2014-11-05 DIAGNOSIS — E785 Hyperlipidemia, unspecified: Secondary | ICD-10-CM | POA: Diagnosis not present

## 2014-11-05 DIAGNOSIS — F419 Anxiety disorder, unspecified: Secondary | ICD-10-CM | POA: Diagnosis not present

## 2014-11-05 DIAGNOSIS — J449 Chronic obstructive pulmonary disease, unspecified: Secondary | ICD-10-CM | POA: Diagnosis not present

## 2014-11-05 DIAGNOSIS — R0602 Shortness of breath: Secondary | ICD-10-CM | POA: Diagnosis present

## 2014-11-05 DIAGNOSIS — Z85820 Personal history of malignant melanoma of skin: Secondary | ICD-10-CM | POA: Insufficient documentation

## 2014-11-05 DIAGNOSIS — I10 Essential (primary) hypertension: Secondary | ICD-10-CM | POA: Insufficient documentation

## 2014-11-05 DIAGNOSIS — R2243 Localized swelling, mass and lump, lower limb, bilateral: Secondary | ICD-10-CM | POA: Diagnosis not present

## 2014-11-05 LAB — BASIC METABOLIC PANEL
ANION GAP: 10 (ref 5–15)
BUN: 17 mg/dL (ref 6–20)
CALCIUM: 8 mg/dL — AB (ref 8.9–10.3)
CO2: 20 mmol/L — ABNORMAL LOW (ref 22–32)
Chloride: 110 mmol/L (ref 101–111)
Creatinine, Ser: 2.48 mg/dL — ABNORMAL HIGH (ref 0.61–1.24)
GFR calc Af Amer: 31 mL/min — ABNORMAL LOW (ref >60)
GFR calc non Af Amer: 27 mL/min — ABNORMAL LOW (ref >60)
GLUCOSE: 112 mg/dL — AB (ref 70–99)
POTASSIUM: 3.9 mmol/L (ref 3.5–5.1)
SODIUM: 140 mmol/L (ref 135–145)

## 2014-11-05 LAB — CBC
HEMATOCRIT: 34.5 % — AB (ref 39.0–52.0)
HEMOGLOBIN: 11.6 g/dL — AB (ref 13.0–17.0)
MCH: 28.6 pg (ref 26.0–34.0)
MCHC: 33.6 g/dL (ref 30.0–36.0)
MCV: 85.2 fL (ref 78.0–100.0)
Platelets: 216 10*3/uL (ref 150–400)
RBC: 4.05 MIL/uL — ABNORMAL LOW (ref 4.22–5.81)
RDW: 14.6 % (ref 11.5–15.5)
WBC: 8.6 10*3/uL (ref 4.0–10.5)

## 2014-11-05 LAB — BRAIN NATRIURETIC PEPTIDE: B NATRIURETIC PEPTIDE 5: 194.8 pg/mL — AB (ref 0.0–100.0)

## 2014-11-05 LAB — I-STAT TROPONIN, ED: Troponin i, poc: 0.01 ng/mL (ref 0.00–0.08)

## 2014-11-05 MED ORDER — POTASSIUM CHLORIDE CRYS ER 20 MEQ PO TBCR
60.0000 meq | EXTENDED_RELEASE_TABLET | Freq: Once | ORAL | Status: AC
  Administered 2014-11-05: 60 meq via ORAL
  Filled 2014-11-05: qty 3

## 2014-11-05 MED ORDER — POTASSIUM CHLORIDE CRYS ER 20 MEQ PO TBCR: 40.0000 meq | EXTENDED_RELEASE_TABLET | Freq: Every day | ORAL | Status: DC

## 2014-11-05 MED ORDER — FUROSEMIDE 10 MG/ML IJ SOLN
80.0000 mg | Freq: Once | INTRAMUSCULAR | Status: AC
  Administered 2014-11-05: 80 mg via INTRAVENOUS
  Filled 2014-11-05: qty 8

## 2014-11-05 MED ORDER — FUROSEMIDE 80 MG PO TABS
80.0000 mg | ORAL_TABLET | Freq: Every day | ORAL | Status: DC
Start: 2014-11-05 — End: 2014-11-17

## 2014-11-05 MED ORDER — MORPHINE SULFATE 4 MG/ML IJ SOLN
6.0000 mg | Freq: Once | INTRAMUSCULAR | Status: AC
  Administered 2014-11-05: 6 mg via INTRAVENOUS
  Filled 2014-11-05: qty 2

## 2014-11-05 NOTE — ED Provider Notes (Signed)
CSN: [id number]     Arrival date & time 11/05/14  9528 History  This chart was scribed for Everlene Balls, MD by Delphia Grates, ED Scribe. This patient was seen in room D32C/D32C and the patient's care was started at 1:12 AM.   Chief Complaint  Patient presents with  . Shortness of Breath  . Leg Swelling    The history is provided by the patient. No language interpreter was used.     HPI Comments: Johnny Sims is a 59 y.o. male, brought in by ambulance, who presents to the Emergency Department complaining of worsening BLE swelling with associated constant, 10/10, BLE pain that has been ongoing for the past few weeks. Patient was seen here 3 weeks ago on October 15, 2014 for leg swelling and reports he was prescribed Lasix that improved his symptoms after he was informed by a physician to increase his dosage from 40mg  to 80mg  daily. He states he subsequently ran out 4 days ago and is unable to get this refilled for 3 weeks. There is associated SOB. Patient also mentions he has gained 57 pounds in excess fluid (from 322-265 pounds). Patient is not established with a PCP.    Past Medical History  Diagnosis Date  . PAF (paroxysmal atrial fibrillation)   . Diabetes mellitus   . Hypothyroidism   . Hyperlipidemia   . Hypertension   . Chronic pain   . OA (osteoarthritis)   . COPD (chronic obstructive pulmonary disease)   . RA (rheumatoid arthritis)   . Hepatitis C   . Venous stasis   . Bipolar 1 disorder   . Hepatitis C   . Fibromyalgia   . Dysrhythmia     PAF  . Asthma   . Anxiety   . Melanoma     face, shoulder arm  . Pancreatitis, acute 2015   Past Surgical History  Procedure Laterality Date  . Thyroidectomy    . Tonsillectomy    . US echocardiography  02/19/2010    EF 55-60%  . Skin cancer excision      melonoma  . Fracture surgery Left     arm  . Submandibular gland excision Left 12/25/2013    w/sialolithotomy  . Inguinal hernia repair Bilateral   . Hernia repair       umbicial  . Knee surgery      /H&P 02/20/2010  . Submandibular gland excision Left 12/25/2013    Procedure: SUBMANDIBULAR GLAND STONE AND CHRONIC SIALOLITHIASIS EXCISION;  Surgeon: Melida Quitter, MD;  Location: [facility] OR;  Service: ENT;  Laterality: Left;   Family History  Problem Relation Age of Onset  . Hypertension Mother   . Alzheimer's disease Mother   . Heart disease Father    History  Substance Use Topics  . Smoking status: Current Every Day Smoker -- 1.00 packs/day for 43 years  . Smokeless tobacco: Not on file  . Alcohol Use: 0.6 oz/week    1 Cans of beer per week    Review of Systems  A complete 10 system review of systems was obtained and all systems are negative except as noted in the HPI and PMH.    Allergies  Oxycodone hcl; Antihistamines, chlorpheniramine-type; Iodinated diagnostic agents; Lidocaine; Methadone; Novolog; Other; Pentazocine lactate; Sulfa antibiotics; and Vicodin  Home Medications   Prior to Admission medications   Medication Sig Start Date End Date Taking? Authorizing Provider  amLODipine (NORVASC) 5 MG tablet Take 5 mg by mouth daily.    Historical Provider, MD  amoxicillin-clavulanate (AUGMENTIN) 875-125 MG per tablet Take 1 tablet by mouth 2 (two) times daily. Patient not taking: Reported on 10/13/2014 09/24/14   Reyne Dumas, MD  cholecalciferol (VITAMIN D) 1000 UNITS tablet Take 2,000 Units by mouth every evening.     Historical Provider, MD  COUMADIN 10 MG tablet Take 1 tablet (10 mg total) by mouth as directed. Patient taking differently: Take 10 mg by mouth as directed. Monday, Wednesday, Friday and Saturday - alternating with 7.5mg  08/20/14   Thayer Headings, MD  COUMADIN 7.5 MG tablet Take 1 tablet (7.5 mg total) by mouth as directed. Patient taking differently: Take 7.5 mg by mouth as directed. Sunday, Tuesday, Thursday - alternating with 10mg  08/20/14   Thayer Headings, MD  diazepam (VALIUM) 10 MG tablet Take 10 mg by mouth 3 (three) times daily.  For anxiety    Historical Provider, MD  doxycycline (VIBRAMYCIN) 50 MG capsule Take 2 capsules (100 mg total) by mouth 2 (two) times daily. Patient not taking: Reported on 10/13/2014 09/24/14   Reyne Dumas, MD  fluticasone (FLONASE) 50 MCG/ACT nasal spray Place 1 spray into both nostrils daily as needed for allergies.     Historical Provider, MD  furosemide (LASIX) 40 MG tablet Take 1 tablet (40 mg total) by mouth daily. 10/15/14   Veryl Speak, MD  Insulin Pen Needle (SURE COMFORT PEN NEEDLES) 31G X 8 MM MISC Use insulin once nightly for E11.9 08/28/14   Lance Bosch, NP  LANTUS SOLOSTAR 100 UNIT/ML Solostar Pen INJECT 48 UNITS INTO SKIN AT BEDTIME 10/17/14   Tresa Garter, MD  levothyroxine (SYNTHROID, LEVOTHROID) 88 MCG tablet Take 1 tablet (88 mcg total) by mouth daily. 08/28/14   Lance Bosch, NP  lisinopril (PRINIVIL,ZESTRIL) 40 MG tablet Take 40 mg by mouth every morning.  07/25/14   Historical Provider, MD  loratadine (CLARITIN) 10 MG tablet Take 1 tablet (10 mg total) by mouth daily as needed for allergies. 08/16/14   Britt Bottom, NP  metoprolol (LOPRESSOR) 50 MG tablet Take 1 tablet (50 mg total) by mouth daily. 08/28/14   Lance Bosch, NP  Multiple Vitamin (MULTIVITAMIN) tablet Take 1 tablet by mouth every morning.     Historical Provider, MD  nicotine (NICODERM CQ - DOSED IN MG/24 HOURS) 21 mg/24hr patch Place 1 patch (21 mg total) onto the skin daily. Patient not taking: Reported on 10/13/2014 09/24/14   Reyne Dumas, MD  omeprazole (PRILOSEC) 40 MG capsule Take 1 capsule (40 mg total) by mouth daily. Patient not taking: Reported on 10/13/2014 08/28/14   Lance Bosch, NP  oxyCODONE (ROXICODONE) 30 MG immediate release tablet Take 1 tablet (30 mg total) by mouth 5 (five) times daily. 12/26/13   Melida Quitter, MD  potassium chloride (K-DUR) 10 MEQ tablet Take 2 tablets (20 mEq total) by mouth daily. 10/15/14   Veryl Speak, MD  simvastatin (ZOCOR) 20 MG tablet Take 1 tablet (20 mg  total) by mouth every evening. Patient not taking: Reported on 10/13/2014 08/16/14   Britt Bottom, NP  simvastatin (ZOCOR) 20 MG tablet Take 1 tablet (20 mg total) by mouth daily. 08/28/14   Lance Bosch, NP  tiotropium (SPIRIVA) 18 MCG inhalation capsule Place 1 capsule (18 mcg total) into inhaler and inhale daily. 10/13/14   Micheline Chapman, NP   Triage Vitals: BP 165/96 mmHg  Pulse 121  Temp(Src) 98.4 F (36.9 C) (Oral)  Resp 20  Ht 6\' 9"  (2.057 m)  Wt 322 lb  11.2 oz (146.376 kg)  BMI 34.59 kg/m2  SpO2 100%  Physical Exam  Constitutional: He is oriented to person, place, and time. Vital signs are normal. He appears well-developed and well-nourished.  Non-toxic appearance. He does not appear ill. No distress.  HENT:  Head: Normocephalic and atraumatic.  Nose: Nose normal.  Mouth/Throat: Oropharynx is clear and moist. No oropharyngeal exudate.  Eyes: Conjunctivae and EOM are normal. Pupils are equal, round, and reactive to light. No scleral icterus.  Neck: Normal range of motion. Neck supple. No tracheal deviation, no edema, no erythema and normal range of motion present. No thyroid mass and no thyromegaly present.  Cardiovascular: S1 normal, S2 normal, normal heart sounds, intact distal pulses and normal pulses.  An irregularly irregular rhythm present. Tachycardia present.  Exam reveals no gallop and no friction rub.   No murmur heard. Pulses:      Radial pulses are 2+ on the right side, and 2+ on the left side.       Dorsalis pedis pulses are 2+ on the right side, and 2+ on the left side.  Pulmonary/Chest: Effort normal and breath sounds normal. No respiratory distress. He has no wheezes. He has no rhonchi. He has no rales.  Intermittent crackles posterior at the bases.  Abdominal: Soft. Normal appearance and bowel sounds are normal. He exhibits no distension, no ascites and no mass. There is no hepatosplenomegaly. There is no tenderness. There is no rebound, no guarding and no  CVA tenderness.  Musculoskeletal: Normal range of motion. He exhibits edema. He exhibits no tenderness.  Lymphadenopathy:    He has no cervical adenopathy.  Neurological: He is alert and oriented to person, place, and time. He has normal strength. No cranial nerve deficit or sensory deficit.  Skin: Skin is warm, dry and intact. No petechiae and no rash noted. He is not diaphoretic. No erythema. No pallor.  Psychiatric: He has a normal mood and affect. His behavior is normal. Judgment normal.  Nursing note and vitals reviewed.   ED Course  Procedures (including critical care time)  DIAGNOSTIC STUDIES: Oxygen Saturation is 100% on room air, normal by my interpretation.    COORDINATION OF CARE: At 0118 Discussed treatment plan with patient which includes Lasix and pain medication. Patient also advised to become established with a PCP as the ED is not a place for treatment/management of chronic medical conditions. Patient agrees.   Labs Review Labs Reviewed  CBC - Abnormal; Notable for the following:    RBC 4.05 (*)    Hemoglobin 11.6 (*)    HCT 34.5 (*)    All other components within normal limits  BASIC METABOLIC PANEL - Abnormal; Notable for the following:    CO2 20 (*)    Glucose, Bld 112 (*)    Creatinine, Ser 2.48 (*)    Calcium 8.0 (*)    GFR calc non Af Amer 27 (*)    GFR calc Af Amer 31 (*)    All other components within normal limits  BRAIN NATRIURETIC PEPTIDE - Abnormal; Notable for the following:    B Natriuretic Peptide 194.8 (*)    All other components within normal limits  I-STAT TROPOININ, ED    Imaging Review No results found.   EKG Interpretation   Date/Time:  Wednesday Nov 05 2014 01:11:11 EDT Ventricular Rate:  86 PR Interval:    QRS Duration: 89 QT Interval:  346 QTC Calculation: 414 R Axis:   64 Text Interpretation:  Atrial fibrillation No  significant change since last  tracing Confirmed by Glynn Octave [phone]) on 11/05/2014 1:17:05 AM       MDM   Final diagnoses:  None   Patient presents emergency department for swelling and pain in his legs. He has been seen in emergency department and given Lasix for treatment. He states he cannot get follow-up with [facility] and they will not give him medications.  He is requesting an alternate PCP to follow up with.  This will be provided for him.   Primary care follow-up was stressed to the patient. Patient was given morphine for pain, and IV Lasix in the emergency department. I will refill his Lasix and potassium medications, however I stressed that he cannot keep returning to the emergency department for med refills.Despite his stated weight gain, his oxygenation is completely normal.  His pulmonary exam reveals on minimal crackles with no tachypnea, respiratory distress or use of accessory muscles.   Patient demonstrated understanding to this plan. He has a known history of A. fib and is currently rate controlled. His vital signs remain within his normal limits and he is safe for discharge.   I personally performed the services described in this documentation, which was scribed in my presence. The recorded information has been reviewed and is accurate.   Everlene Balls, MD 11/05/14 1419

## 2014-11-05 NOTE — ED Notes (Signed)
Additionally patient reports that he has been unable to take his Lasix since Friday of last week. Reports that he was prescribed 40mg  Lasix to take daily, but has been taking 80mg /day instead per verbal recommendation of MD.

## 2014-11-05 NOTE — Discharge Instructions (Signed)
[name], take medications as prescribed. The primary care physician within 3 days for close follow-up. If symptoms worsen come back to emergency department and medially. Thank you. Edema is an abnormal buildup of fluids. It is more common in your legs and thighs. Painless swelling of the feet and ankles is more likely as a person ages. It also is common in looser skin, like around your eyes. HOME CARE   Keep the affected body part above the level of the heart while lying down.  Do not sit still or stand for a long time.  Do not put anything right under your knees when you lie down.  Do not wear tight clothes on your upper legs.  Exercise your legs to help the puffiness (swelling) go down.  Wear elastic bandages or support stockings as told by your doctor.  A low-salt diet may help lessen the puffiness.  Only take medicine as told by your doctor. GET HELP IF:  Treatment is not working.  You have heart, liver, or kidney disease and notice that your skin looks puffy or shiny.  You have puffiness in your legs that does not get better when you raise your legs.  You have sudden weight gain for no reason. GET HELP RIGHT AWAY IF:   You have shortness of breath or chest pain.  You cannot breathe when you lie down.  You have pain, redness, or warmth in the areas that are puffy.  You have heart, liver, or kidney disease and get edema all of a sudden.  You have a fever and your symptoms get worse all of a sudden. MAKE SURE YOU:   Understand these instructions.  Will watch your condition.  Will get help right away if you are not doing well or get worse. Document Released: 11/30/2007 Document Revised: 06/18/2013 Document Reviewed: 04/05/2013 [facility] Patient Information 2015 [place], [place]. This information is not intended to replace advice given to you by your health care provider. Make sure you discuss any questions you have with your health care provider.

## 2014-11-05 NOTE — ED Notes (Signed)
Patient here via EMS from home with complaint of shortness of breath starting tonight and bilateral lower leg swelling for a few weeks. EMS initially reported that patient was wheezing bilaterally, Albuterol 5mg  and 125 Solu-medrol was administered by EMS PTA. Arrived on RA with improved SOB.

## 2014-11-10 ENCOUNTER — Inpatient Hospital Stay (HOSPITAL_COMMUNITY)
Admission: EM | Admit: 2014-11-10 | Discharge: 2014-11-17 | DRG: 503 | Disposition: A | Discharge status: Home Health | Payer: Medicaid Other | Attending: Internal Medicine | Admitting: Internal Medicine

## 2014-11-10 ENCOUNTER — Emergency Department (HOSPITAL_COMMUNITY): Payer: Medicaid Other

## 2014-11-10 ENCOUNTER — Encounter (HOSPITAL_COMMUNITY): Payer: Self-pay

## 2014-11-10 DIAGNOSIS — E785 Hyperlipidemia, unspecified: Secondary | ICD-10-CM | POA: Diagnosis present

## 2014-11-10 DIAGNOSIS — M1990 Unspecified osteoarthritis, unspecified site: Secondary | ICD-10-CM | POA: Diagnosis present

## 2014-11-10 DIAGNOSIS — D649 Anemia, unspecified: Secondary | ICD-10-CM | POA: Diagnosis present

## 2014-11-10 DIAGNOSIS — Z820 Family history of epilepsy and other diseases of the nervous system: Secondary | ICD-10-CM | POA: Diagnosis not present

## 2014-11-10 DIAGNOSIS — E669 Obesity, unspecified: Secondary | ICD-10-CM | POA: Diagnosis present

## 2014-11-10 DIAGNOSIS — I129 Hypertensive chronic kidney disease with stage 1 through stage 4 chronic kidney disease, or unspecified chronic kidney disease: Secondary | ICD-10-CM | POA: Diagnosis present

## 2014-11-10 DIAGNOSIS — F3160 Bipolar disorder, current episode mixed, unspecified: Secondary | ICD-10-CM | POA: Diagnosis present

## 2014-11-10 DIAGNOSIS — I5043 Acute on chronic combined systolic (congestive) and diastolic (congestive) heart failure: Secondary | ICD-10-CM | POA: Insufficient documentation

## 2014-11-10 DIAGNOSIS — N183 Chronic kidney disease, stage 3 unspecified: Secondary | ICD-10-CM | POA: Diagnosis present

## 2014-11-10 DIAGNOSIS — Z794 Long term (current) use of insulin: Secondary | ICD-10-CM | POA: Diagnosis not present

## 2014-11-10 DIAGNOSIS — E039 Hypothyroidism, unspecified: Secondary | ICD-10-CM | POA: Diagnosis present

## 2014-11-10 DIAGNOSIS — M86179 Other acute osteomyelitis, unspecified ankle and foot: Principal | ICD-10-CM | POA: Diagnosis present

## 2014-11-10 DIAGNOSIS — L97529 Non-pressure chronic ulcer of other part of left foot with unspecified severity: Secondary | ICD-10-CM | POA: Diagnosis present

## 2014-11-10 DIAGNOSIS — Z85820 Personal history of malignant melanoma of skin: Secondary | ICD-10-CM

## 2014-11-10 DIAGNOSIS — N289 Disorder of kidney and ureter, unspecified: Secondary | ICD-10-CM

## 2014-11-10 DIAGNOSIS — I4891 Unspecified atrial fibrillation: Secondary | ICD-10-CM | POA: Diagnosis not present

## 2014-11-10 DIAGNOSIS — M069 Rheumatoid arthritis, unspecified: Secondary | ICD-10-CM | POA: Diagnosis present

## 2014-11-10 DIAGNOSIS — L0390 Cellulitis, unspecified: Secondary | ICD-10-CM | POA: Diagnosis present

## 2014-11-10 DIAGNOSIS — Z6834 Body mass index (BMI) 34.0-34.9, adult: Secondary | ICD-10-CM

## 2014-11-10 DIAGNOSIS — F319 Bipolar disorder, unspecified: Secondary | ICD-10-CM | POA: Diagnosis present

## 2014-11-10 DIAGNOSIS — M797 Fibromyalgia: Secondary | ICD-10-CM | POA: Diagnosis present

## 2014-11-10 DIAGNOSIS — J449 Chronic obstructive pulmonary disease, unspecified: Secondary | ICD-10-CM | POA: Diagnosis present

## 2014-11-10 DIAGNOSIS — K5900 Constipation, unspecified: Secondary | ICD-10-CM | POA: Diagnosis not present

## 2014-11-10 DIAGNOSIS — J441 Chronic obstructive pulmonary disease with (acute) exacerbation: Secondary | ICD-10-CM | POA: Diagnosis present

## 2014-11-10 DIAGNOSIS — F17210 Nicotine dependence, cigarettes, uncomplicated: Secondary | ICD-10-CM | POA: Diagnosis present

## 2014-11-10 DIAGNOSIS — M869 Osteomyelitis, unspecified: Secondary | ICD-10-CM | POA: Diagnosis not present

## 2014-11-10 DIAGNOSIS — I5033 Acute on chronic diastolic (congestive) heart failure: Secondary | ICD-10-CM | POA: Diagnosis present

## 2014-11-10 DIAGNOSIS — Z9114 Patient's other noncompliance with medication regimen: Secondary | ICD-10-CM | POA: Diagnosis present

## 2014-11-10 DIAGNOSIS — E1140 Type 2 diabetes mellitus with diabetic neuropathy, unspecified: Secondary | ICD-10-CM | POA: Diagnosis present

## 2014-11-10 DIAGNOSIS — G8929 Other chronic pain: Secondary | ICD-10-CM | POA: Diagnosis present

## 2014-11-10 DIAGNOSIS — Z7901 Long term (current) use of anticoagulants: Secondary | ICD-10-CM

## 2014-11-10 DIAGNOSIS — Z8249 Family history of ischemic heart disease and other diseases of the circulatory system: Secondary | ICD-10-CM | POA: Diagnosis not present

## 2014-11-10 DIAGNOSIS — K219 Gastro-esophageal reflux disease without esophagitis: Secondary | ICD-10-CM | POA: Diagnosis present

## 2014-11-10 DIAGNOSIS — F3162 Bipolar disorder, current episode mixed, moderate: Secondary | ICD-10-CM | POA: Diagnosis not present

## 2014-11-10 DIAGNOSIS — N184 Chronic kidney disease, stage 4 (severe): Secondary | ICD-10-CM | POA: Diagnosis present

## 2014-11-10 DIAGNOSIS — E11621 Type 2 diabetes mellitus with foot ulcer: Secondary | ICD-10-CM | POA: Diagnosis present

## 2014-11-10 DIAGNOSIS — I10 Essential (primary) hypertension: Secondary | ICD-10-CM | POA: Diagnosis not present

## 2014-11-10 DIAGNOSIS — M79672 Pain in left foot: Secondary | ICD-10-CM | POA: Diagnosis present

## 2014-11-10 DIAGNOSIS — F22 Delusional disorders: Secondary | ICD-10-CM | POA: Diagnosis present

## 2014-11-10 DIAGNOSIS — I480 Paroxysmal atrial fibrillation: Secondary | ICD-10-CM | POA: Diagnosis present

## 2014-11-10 DIAGNOSIS — F419 Anxiety disorder, unspecified: Secondary | ICD-10-CM | POA: Diagnosis present

## 2014-11-10 DIAGNOSIS — B1920 Unspecified viral hepatitis C without hepatic coma: Secondary | ICD-10-CM | POA: Diagnosis present

## 2014-11-10 DIAGNOSIS — N179 Acute kidney failure, unspecified: Secondary | ICD-10-CM | POA: Diagnosis present

## 2014-11-10 DIAGNOSIS — K210 Gastro-esophageal reflux disease with esophagitis: Secondary | ICD-10-CM

## 2014-11-10 DIAGNOSIS — I509 Heart failure, unspecified: Secondary | ICD-10-CM

## 2014-11-10 DIAGNOSIS — B182 Chronic viral hepatitis C: Secondary | ICD-10-CM | POA: Diagnosis present

## 2014-11-10 DIAGNOSIS — F17200 Nicotine dependence, unspecified, uncomplicated: Secondary | ICD-10-CM | POA: Diagnosis present

## 2014-11-10 DIAGNOSIS — R21 Rash and other nonspecific skin eruption: Secondary | ICD-10-CM | POA: Diagnosis not present

## 2014-11-10 HISTORY — DX: Chronic kidney disease, stage 2 (mild): N18.2

## 2014-11-10 HISTORY — DX: Gastro-esophageal reflux disease without esophagitis: K21.9

## 2014-11-10 LAB — BASIC METABOLIC PANEL
Anion gap: 9 (ref 5–15)
BUN: 29 mg/dL — ABNORMAL HIGH (ref 6–20)
CO2: 21 mmol/L — ABNORMAL LOW (ref 22–32)
CREATININE: 2.89 mg/dL — AB (ref 0.61–1.24)
Calcium: 7.9 mg/dL — ABNORMAL LOW (ref 8.9–10.3)
Chloride: 109 mmol/L (ref 101–111)
GFR, EST AFRICAN AMERICAN: 26 mL/min — AB (ref >60)
GFR, EST NON AFRICAN AMERICAN: 22 mL/min — AB (ref >60)
Glucose, Bld: 131 mg/dL — ABNORMAL HIGH (ref 65–99)
Potassium: 4.8 mmol/L (ref 3.5–5.1)
Sodium: 139 mmol/L (ref 135–145)

## 2014-11-10 LAB — CBC WITH DIFFERENTIAL/PLATELET
BASOS PCT: 0 % (ref 0–1)
Basophils Absolute: 0 10*3/uL (ref 0.0–0.1)
Eosinophils Absolute: 0.1 10*3/uL (ref 0.0–0.7)
Eosinophils Relative: 1 % (ref 0–5)
HEMATOCRIT: 33.8 % — AB (ref 39.0–52.0)
Hemoglobin: 11.4 g/dL — ABNORMAL LOW (ref 13.0–17.0)
LYMPHS ABS: 2.4 10*3/uL (ref 0.7–4.0)
Lymphocytes Relative: 20 % (ref 12–46)
MCH: 29.1 pg (ref 26.0–34.0)
MCHC: 33.7 g/dL (ref 30.0–36.0)
MCV: 86.2 fL (ref 78.0–100.0)
MONO ABS: 1 10*3/uL (ref 0.1–1.0)
MONOS PCT: 8 % (ref 3–12)
Neutro Abs: 8.4 10*3/uL — ABNORMAL HIGH (ref 1.7–7.7)
Neutrophils Relative %: 71 % (ref 43–77)
PLATELETS: 233 10*3/uL (ref 150–400)
RBC: 3.92 MIL/uL — ABNORMAL LOW (ref 4.22–5.81)
RDW: 14.8 % (ref 11.5–15.5)
WBC: 11.9 10*3/uL — AB (ref 4.0–10.5)

## 2014-11-10 LAB — I-STAT TROPONIN, ED: TROPONIN I, POC: 0 ng/mL (ref 0.00–0.08)

## 2014-11-10 LAB — I-STAT CG4 LACTIC ACID, ED: Lactic Acid, Venous: 0.53 mmol/L (ref 0.5–2.0)

## 2014-11-10 MED ORDER — ADULT MULTIVITAMIN W/MINERALS CH
1.0000 | ORAL_TABLET | Freq: Every morning | ORAL | Status: DC
  Administered 2014-11-11 – 2014-11-17 (×7): 1 via ORAL
  Filled 2014-11-10 (×7): qty 1

## 2014-11-10 MED ORDER — AMLODIPINE BESYLATE 5 MG PO TABS
5.0000 mg | ORAL_TABLET | Freq: Every day | ORAL | Status: DC
  Administered 2014-11-11: 5 mg via ORAL
  Filled 2014-11-10: qty 1

## 2014-11-10 MED ORDER — ALUM & MAG HYDROXIDE-SIMETH 200-200-20 MG/5ML PO SUSP: 30.0000 mL | Freq: Four times a day (QID) | ORAL | Status: DC | PRN

## 2014-11-10 MED ORDER — SODIUM CHLORIDE 0.9 % IV SOLN: INTRAVENOUS | Status: DC

## 2014-11-10 MED ORDER — ONDANSETRON HCL 4 MG/2ML IJ SOLN: 4.0000 mg | Freq: Four times a day (QID) | INTRAMUSCULAR | Status: DC | PRN

## 2014-11-10 MED ORDER — OXYCODONE-ACETAMINOPHEN 5-325 MG PO TABS
1.0000 | ORAL_TABLET | Freq: Once | ORAL | Status: AC
  Administered 2014-11-10: 1 via ORAL
  Filled 2014-11-10: qty 1

## 2014-11-10 MED ORDER — VANCOMYCIN HCL 10 G IV SOLR
1500.0000 mg | Freq: Every day | INTRAVENOUS | Status: DC
  Administered 2014-11-11 (×2): 1500 mg via INTRAVENOUS
  Filled 2014-11-10 (×3): qty 1500

## 2014-11-10 MED ORDER — SIMVASTATIN 20 MG PO TABS: 20.0000 mg | ORAL_TABLET | Freq: Every evening | ORAL | Status: DC

## 2014-11-10 MED ORDER — FLUTICASONE PROPIONATE 50 MCG/ACT NA SUSP
1.0000 | Freq: Every day | NASAL | Status: DC | PRN
  Filled 2014-11-10: qty 16

## 2014-11-10 MED ORDER — VANCOMYCIN HCL IN DEXTROSE 1-5 GM/200ML-% IV SOLN
1000.0000 mg | Freq: Once | INTRAVENOUS | Status: AC
  Administered 2014-11-10: 1000 mg via INTRAVENOUS
  Filled 2014-11-10: qty 200

## 2014-11-10 MED ORDER — VITAMIN D3 25 MCG (1000 UNIT) PO TABS
2000.0000 [IU] | ORAL_TABLET | Freq: Every evening | ORAL | Status: DC
  Administered 2014-11-12 – 2014-11-13 (×2): 2000 [IU] via ORAL
  Filled 2014-11-10 (×4): qty 2

## 2014-11-10 MED ORDER — HEPARIN SODIUM (PORCINE) 5000 UNIT/ML IJ SOLN
5000.0000 [IU] | Freq: Three times a day (TID) | INTRAMUSCULAR | Status: DC
  Administered 2014-11-11 – 2014-11-13 (×6): 5000 [IU] via SUBCUTANEOUS
  Filled 2014-11-10 (×10): qty 1

## 2014-11-10 MED ORDER — DIAZEPAM 5 MG PO TABS
10.0000 mg | ORAL_TABLET | Freq: Three times a day (TID) | ORAL | Status: DC
  Administered 2014-11-10: 10 mg via ORAL
  Filled 2014-11-10: qty 2

## 2014-11-10 MED ORDER — TIOTROPIUM BROMIDE MONOHYDRATE 18 MCG IN CAPS
18.0000 ug | ORAL_CAPSULE | Freq: Every day | RESPIRATORY_TRACT | Status: DC
  Administered 2014-11-11 – 2014-11-17 (×7): 18 ug via RESPIRATORY_TRACT
  Filled 2014-11-10 (×2): qty 5

## 2014-11-10 MED ORDER — PANTOPRAZOLE SODIUM 40 MG PO TBEC
40.0000 mg | DELAYED_RELEASE_TABLET | Freq: Every day | ORAL | Status: DC
  Administered 2014-11-11 – 2014-11-17 (×7): 40 mg via ORAL
  Filled 2014-11-10 (×5): qty 1

## 2014-11-10 MED ORDER — HYDRALAZINE HCL 20 MG/ML IJ SOLN: 5.0000 mg | INTRAMUSCULAR | Status: DC | PRN

## 2014-11-10 MED ORDER — ONDANSETRON HCL 4 MG PO TABS: 4.0000 mg | ORAL_TABLET | Freq: Four times a day (QID) | ORAL | Status: DC | PRN

## 2014-11-10 MED ORDER — VANCOMYCIN HCL IN DEXTROSE 750-5 MG/150ML-% IV SOLN: 750.0000 mg | Freq: Two times a day (BID) | INTRAVENOUS | Status: DC

## 2014-11-10 MED ORDER — LORATADINE 10 MG PO TABS
10.0000 mg | ORAL_TABLET | Freq: Every day | ORAL | Status: DC | PRN
  Filled 2014-11-10: qty 1

## 2014-11-10 MED ORDER — SODIUM CHLORIDE 0.9 % IV BOLUS (SEPSIS)
1000.0000 mL | Freq: Once | INTRAVENOUS | Status: AC
  Administered 2014-11-10: 1000 mL via INTRAVENOUS

## 2014-11-10 MED ORDER — INSULIN GLARGINE 100 UNIT/ML ~~LOC~~ SOLN
22.0000 [IU] | Freq: Every day | SUBCUTANEOUS | Status: DC
  Administered 2014-11-11 – 2014-11-16 (×7): 22 [IU] via SUBCUTANEOUS
  Filled 2014-11-10 (×8): qty 0.22

## 2014-11-10 MED ORDER — DILTIAZEM HCL 100 MG IV SOLR
5.0000 mg/h | Freq: Once | INTRAVENOUS | Status: AC
  Administered 2014-11-10: 5 mg/h via INTRAVENOUS

## 2014-11-10 MED ORDER — METOPROLOL TARTRATE 50 MG PO TABS
50.0000 mg | ORAL_TABLET | Freq: Every day | ORAL | Status: DC
  Administered 2014-11-11 – 2014-11-12 (×2): 50 mg via ORAL
  Filled 2014-11-10 (×2): qty 1

## 2014-11-10 MED ORDER — SIMVASTATIN 20 MG PO TABS
20.0000 mg | ORAL_TABLET | Freq: Every day | ORAL | Status: DC
  Administered 2014-11-11: 20 mg via ORAL
  Filled 2014-11-10: qty 1

## 2014-11-10 MED ORDER — NICOTINE 21 MG/24HR TD PT24
21.0000 mg | MEDICATED_PATCH | TRANSDERMAL | Status: DC
  Administered 2014-11-11 – 2014-11-16 (×5): 21 mg via TRANSDERMAL
  Filled 2014-11-10 (×8): qty 1

## 2014-11-10 MED ORDER — LEVOTHYROXINE SODIUM 88 MCG PO TABS
88.0000 ug | ORAL_TABLET | Freq: Every day | ORAL | Status: DC
  Administered 2014-11-11 – 2014-11-17 (×7): 88 ug via ORAL
  Filled 2014-11-10 (×8): qty 1

## 2014-11-10 MED ORDER — SODIUM CHLORIDE 0.9 % IJ SOLN
3.0000 mL | Freq: Two times a day (BID) | INTRAMUSCULAR | Status: DC
  Administered 2014-11-12 – 2014-11-16 (×5): 3 mL via INTRAVENOUS

## 2014-11-10 MED ORDER — DILTIAZEM HCL 25 MG/5ML IV SOLN: 15.0000 mg | Freq: Once | INTRAVENOUS | Status: DC

## 2014-11-10 MED ORDER — OXYCODONE HCL 5 MG PO TABS
30.0000 mg | ORAL_TABLET | Freq: Every day | ORAL | Status: DC
  Administered 2014-11-10 – 2014-11-11 (×2): 30 mg via ORAL
  Filled 2014-11-10 (×2): qty 6

## 2014-11-10 MED ORDER — DEXTROSE 5 % IV SOLN
5.0000 mg/h | INTRAVENOUS | Status: DC
  Administered 2014-11-11 – 2014-11-12 (×3): 5 mg/h via INTRAVENOUS
  Filled 2014-11-10: qty 100

## 2014-11-10 MED ORDER — PIPERACILLIN-TAZOBACTAM 3.375 G IVPB
3.3750 g | Freq: Three times a day (TID) | INTRAVENOUS | Status: DC
  Administered 2014-11-11 – 2014-11-12 (×5): 3.375 g via INTRAVENOUS
  Filled 2014-11-10 (×7): qty 50

## 2014-11-10 NOTE — H&P (Addendum)
[facility] History and Physical  ZACKARI RUANE ZOX:[id number] DOB: 1956/05/27 DOA: 11/10/2014  Referring physician: ED physician PCP: Angelica Chessman, MD  Specialists:   Chief Complaint: left second toe pain  HPI: Johnny Sims is a 59 y.o. male with PMH of diabetes mellitus, COPD, asthma, rheumatoid arthritis, hepatitis C, bipolar disorder, history of melanoma, history of pancreatitis, PAF on Coumadin, hypertension, hyperlipidemia, hypothyroidism, who presents with left second toe pain  Patient has a chronic diabetic neuropathy with leg pain chronically. Patient reports that he has been having pain over his left second toe for long time. He states he did see Dr. Sharol Given in the office, but states that when he came to see him. Dr. Sharol Given was out of town. He made an appointment with Dr. Percell Miller in [place]. He states his left foot pain and swelling are getting worse. Patient denies fever, chills, headaches, cough, chest pain, SOB, abdominal pain, diarrhea, constipation, dysuria, urgency, frequency, hematuria, skin rashes. No unilateral weakness, numbness or tingling sensations. No vision change or hearing loss.  In ED, patient was found to have osteomyelitis in second toe by X-Topher of left foot . WBC 11.9.  temperature 99.5, lactate 0.53. Creatinine up from recent baseline 1.5-2.5 to 2.8. Patient is admitted to inpatient for further evaluation and treatment. Orthopedic surgeon was consulted by ED, Dr. Lorin Mercy will pass on message to patient's ortho surgeon in AM  Where does patient live?      At home  Can patient participate in ADLs? little  Review of Systems:   General: no fevers, chills, no changes in body weight, normal appetite, no fatigue HEENT: no blurry vision, hearing changes or sore throat Pulm: no dyspnea, coughing, wheezing CV: no chest pain, palpitations, shortness of breath Abd: no nausea, vomiting,abdominal pain, diarrhea, constipation GU: no dysuria, hematuria, polyuria Ext: has  trace leg edema. Pain and swelling over left foot Neuro: no unilateral weakness, numbness, or tingling Skin: no rash MSK: No muscle spasm, no deformity, no limitation of range of movement in spin Heme: No easy bruising.  Travel history: No recent long distant travel.  Allergy:  Allergies  Allergen Reactions  . Antihistamines, Chlorpheniramine-Type Other (See Comments)    unknown  . Iodinated Diagnostic Agents Other (See Comments)    Patient states he doesn't know what iodine is and doesn't think he is allergic to it despite it being listed with his allergies  . Lidocaine Itching    Patient is uncertain of this allergy  . Methadone Other (See Comments)    "I won't take it anymore"  . Novolog [Insulin Aspart]     "I'm just highly allergic"  . Other Hives    Steroid creams  . Pentazocine Lactate Other (See Comments)    unknown  . Sulfa Antibiotics Itching and Other (See Comments)    whelps  . Vicodin [Hydrocodone-Acetaminophen] Palpitations and Other (See Comments)    Doesn't want to take    Past Medical History  Diagnosis Date  . PAF (paroxysmal atrial fibrillation)   . Diabetes mellitus   . Hypothyroidism   . Hyperlipidemia   . Hypertension   . Chronic pain   . OA (osteoarthritis)   . COPD (chronic obstructive pulmonary disease)   . RA (rheumatoid arthritis)   . Hepatitis C   . Venous stasis   . Bipolar 1 disorder   . Hepatitis C   . Fibromyalgia   . Dysrhythmia     PAF  . Asthma   . Anxiety   .  Melanoma     face, shoulder arm  . Pancreatitis, acute 2015  . GERD (gastroesophageal reflux disease)   . CKD (chronic kidney disease), stage II     Past Surgical History  Procedure Laterality Date  . Thyroidectomy    . Tonsillectomy    . US echocardiography  02/19/2010    EF 55-60%  . Skin cancer excision      melonoma  . Fracture surgery Left     arm  . Submandibular gland excision Left 12/25/2013    w/sialolithotomy  . Inguinal hernia repair Bilateral   .  Hernia repair      umbicial  . Knee surgery      /H&P 02/20/2010  . Submandibular gland excision Left 12/25/2013    Procedure: SUBMANDIBULAR GLAND STONE AND CHRONIC SIALOLITHIASIS EXCISION;  Surgeon: Melida Quitter, MD;  Location: [place];  Service: ENT;  Laterality: Left;    Social History:  reports that he has been smoking.  He does not have any smokeless tobacco history on file. He reports that he drinks about 0.6 oz of alcohol per week. He reports that he does not use illicit drugs.  Family History:  Family History  Problem Relation Age of Onset  . Hypertension Mother   . Alzheimer's disease Mother   . Heart disease Father      Prior to Admission medications   Medication Sig Start Date End Date Taking? Authorizing Provider  amLODipine (NORVASC) 5 MG tablet Take 5 mg by mouth daily.    Historical Provider, MD  amoxicillin-clavulanate (AUGMENTIN) 875-125 MG per tablet Take 1 tablet by mouth 2 (two) times daily. Patient not taking: Reported on 10/13/2014 09/24/14   Reyne Dumas, MD  cholecalciferol (VITAMIN D) 1000 UNITS tablet Take 2,000 Units by mouth every evening.     Historical Provider, MD  COUMADIN 10 MG tablet Take 1 tablet (10 mg total) by mouth as directed. Patient taking differently: Take 10 mg by mouth as directed. Monday, Wednesday, Friday and Saturday - alternating with 7.5mg  08/20/14   Thayer Headings, MD  COUMADIN 7.5 MG tablet Take 1 tablet (7.5 mg total) by mouth as directed. Patient taking differently: Take 7.5 mg by mouth as directed. Sunday, Tuesday, Thursday - alternating with 10mg  08/20/14   Thayer Headings, MD  diazepam (VALIUM) 10 MG tablet Take 10 mg by mouth 3 (three) times daily. For anxiety    Historical Provider, MD  doxycycline (VIBRAMYCIN) 50 MG capsule Take 2 capsules (100 mg total) by mouth 2 (two) times daily. Patient not taking: Reported on 10/13/2014 09/24/14   Reyne Dumas, MD  fluticasone (FLONASE) 50 MCG/ACT nasal spray Place 1 spray into both nostrils daily  as needed for allergies.     Historical Provider, MD  furosemide (LASIX) 80 MG tablet Take 1 tablet (80 mg total) by mouth daily. 11/05/14   Everlene Balls, MD  Insulin Pen Needle (SURE COMFORT PEN NEEDLES) 31G X 8 MM MISC Use insulin once nightly for E11.9 08/28/14   Lance Bosch, NP  LANTUS SOLOSTAR 100 UNIT/ML Solostar Pen INJECT 48 UNITS INTO SKIN AT BEDTIME 10/17/14   Tresa Garter, MD  levothyroxine (SYNTHROID, LEVOTHROID) 88 MCG tablet Take 1 tablet (88 mcg total) by mouth daily. 08/28/14   Lance Bosch, NP  lisinopril (PRINIVIL,ZESTRIL) 40 MG tablet Take 40 mg by mouth every morning.  07/25/14   Historical Provider, MD  loratadine (CLARITIN) 10 MG tablet Take 1 tablet (10 mg total) by mouth daily as needed  for allergies. 08/16/14   Britt Bottom, NP  metoprolol (LOPRESSOR) 50 MG tablet Take 1 tablet (50 mg total) by mouth daily. 08/28/14   Lance Bosch, NP  Multiple Vitamin (MULTIVITAMIN) tablet Take 1 tablet by mouth every morning.     Historical Provider, MD  nicotine (NICODERM CQ - DOSED IN MG/24 HOURS) 21 mg/24hr patch Place 1 patch (21 mg total) onto the skin daily. Patient not taking: Reported on 10/13/2014 09/24/14   Reyne Dumas, MD  omeprazole (PRILOSEC) 40 MG capsule Take 1 capsule (40 mg total) by mouth daily. Patient not taking: Reported on 10/13/2014 08/28/14   Lance Bosch, NP  oxyCODONE (ROXICODONE) 30 MG immediate release tablet Take 1 tablet (30 mg total) by mouth 5 (five) times daily. 12/26/13   Melida Quitter, MD  potassium chloride (K-DUR) 10 MEQ tablet Take 2 tablets (20 mEq total) by mouth daily. 10/15/14   Veryl Speak, MD  potassium chloride SA (K-DUR,KLOR-CON) 20 MEQ tablet Take 2 tablets (40 mEq total) by mouth daily. 11/05/14   Everlene Balls, MD  simvastatin (ZOCOR) 20 MG tablet Take 1 tablet (20 mg total) by mouth every evening. Patient not taking: Reported on 10/13/2014 08/16/14   Britt Bottom, NP  simvastatin (ZOCOR) 20 MG tablet Take 1 tablet (20 mg total) by  mouth daily. 08/28/14   Lance Bosch, NP  tiotropium (SPIRIVA) 18 MCG inhalation capsule Place 1 capsule (18 mcg total) into inhaler and inhale daily. 10/13/14   Micheline Chapman, NP    Physical Exam: Filed Vitals:   11/10/14 2330 11/11/14 0000 11/11/14 0055 11/11/14 0100  BP: 152/90 153/104 146/88 151/98  Pulse:      Temp:      TempSrc:      Resp: $Remo'22 14 17 17  'UrBOW$ SpO2:       General: Not in acute distress HEENT:       Eyes: PERRL, EOMI, no scleral icterus       ENT: No discharge from the ears and nose, no pharynx injection, no tonsillar enlargement.        Neck: No JVD, no bruit, no mass felt. Heme: No neck or axillary lymph node enlargement Cardiac: S1/S2, irregularly irregular rhythm, No murmurs, No gallops or rubs Pulm: Good air movement bilaterally. Clear to auscultation bilaterally. No rales, wheezing, rhonchi or rubs. Abd: Soft, nondistended, nontender, no rebound pain, no organomegaly, BS present Ext: No edema bilaterally. 2+DP/PT pulse bilaterally. Left 2nd toe has a lesion over the tip, with coagulated blood at the tip. left foot is swelling and warm, tender to palpation. He has full range of motion of his foot and all toes.  Musculoskeletal: No joint deformities, erythema, or stiffness, ROM full Skin: No rashes.  Neuro: Alert and oriented X3, cranial nerves II-XII grossly intact, muscle strength 5/5 in all extremeties, sensation to light touch intact.  Psych: Patient is not psychotic, no suicidal or hemocidal ideation.  Labs on Admission:  Basic Metabolic Panel:  Recent Labs Lab 11/05/14 0205 11/10/14 2003  NA 140 139  K 3.9 4.8  CL 110 109  CO2 20* 21*  GLUCOSE 112* 131*  BUN 17 29*  CREATININE 2.48* 2.89*  CALCIUM 8.0* 7.9*   Liver Function Tests: No results for input(s): AST, ALT, ALKPHOS, BILITOT, PROT, ALBUMIN in the last 168 hours. No results for input(s): LIPASE, AMYLASE in the last 168 hours. No results for input(s): AMMONIA in the last 168  hours. CBC:  Recent Labs Lab 11/05/14 0205 11/10/14 2003  WBC 8.6 11.9*  NEUTROABS  --  8.4*  HGB 11.6* 11.4*  HCT 34.5* 33.8*  MCV 85.2 86.2  PLT 216 233   Cardiac Enzymes: No results for input(s): CKTOTAL, CKMB, CKMBINDEX, TROPONINI in the last 168 hours.  BNP (last 3 results)  Recent Labs  10/13/14 0730 10/15/14 0524 11/05/14 0205  BNP 124.1* 181.5* 194.8*    ProBNP (last 3 results) No results for input(s): PROBNP in the last 8760 hours.  CBG:  Recent Labs Lab 11/11/14 0033  GLUCAP 191*    Radiological Exams on Admission: Dg Foot Complete Left  11/10/2014   CLINICAL DATA:  LEFT leg edema and swelling. Second toe swelling and color changes.  EXAM: LEFT FOOT - COMPLETE 3+ VIEW  COMPARISON:  None.  FINDINGS: There is diffuse soft tissue swelling around the second toe. The toe is flexed and the terminal phalanx is suboptimally visualized however there appears to be osteolysis of the terminal phalanx. The terminal phalanx is small and the appearance is compatible with osteomyelitis, particularly with gas in the adjacent soft tissues. The other toes appear within normal limits. There is no proximal tracking of gas in the foot.  IMPRESSION: Second toe osteomyelitis with adjacent soft tissue swelling and gas in the distal second toe.   Electronically Signed   By: Dereck Ligas M.D.   On: 11/10/2014 19:30    EKG: Independently reviewed.  Abnormal findings: A. fib, QTC 477 Assessment/Plan Principal Problem:   Osteomyelitis of left foot Active Problems:   Atrial fibrillation with RVR   Diabetes mellitus   Hyperlipidemia   Essential hypertension   Bipolar disease, chronic   Hepatitis C   COPD (chronic obstructive pulmonary disease)   Long term current use of anticoagulant therapy   Tobacco use disorder   Osteomyelitis of foot, acute   Acute renal failure superimposed on stage 3 chronic kidney disease   GERD (gastroesophageal reflux disease)   Osteomyelitis of  left foot: As evidenced by X-Langford. Patient is not obviously septic on admission. Hemodynamically stable. Orthopedic surgery was consulted by ED. - Admit to tele bed given Afib - Empiric antimicrobial treatment with vancomycin IV and zosyn - IVF: ns 50 cc/h and hold lasix  - Blood cultures x 2 - prn oxycodon for pain.   - check lactic acid, procalcitonin, ESR and Crp - INR/PTT, type and screen -Follow-up ortho recommendation  Atrial Fibrillation-RVR: CHA2DS2-VASc Score is 3 (HTN, DM and possible PVD), needs oral anticoagulation. Patient is on Coumadin at home. INR is 1.7 on admission, which is slightly sub therapeutic. He has RVR on admission with HR up to 140-150/min, hemodynamically stable. -will start cardizem gtt and oral cardizem 10 mg tid -continue metoprolol  -coumadin per pharmacy  Hyperlipidemia: LDL was 33 on 02/19/10 -Continue Zocor  Diabetes mellitus: Patient was on Lantus 28 units daily -decrease Lantus to 22 units daily   - will start Novolin sliding scale insulin (he is allergic to Novolog)  Hypertension: -Continue amlodipine, Toprol -Hold lisinopril -prn IV hydralazine  CKD-III: Baseline creatinine 1.5-2.5, his creatinine is 2.89 on admission, which is slightly higher than the baseline, likely due to lasix and lisnopril use -will check FeUrea -hold lasix and lisinopril -try IVF as above  COPD: No signs of acute exacerbation. -Continue spirava  Tobacco abuse:  -Counseled about the importance of quitting smoking   -nicotine patch  GERD:  -Protonix   DVT ppx: on Coumadin  Code Status: Full code Family Communication: None at bed side.  Disposition Plan: Admit to inpatient   Date of Service 11/11/2014    Ivor Costa [facility] Pager [phone]  If 7PM-7AM, please contact night-coverage [email or web address] Password [email or web address] 11/11/2014, 2:04 AM

## 2014-11-10 NOTE — ED Notes (Signed)
Pt states "If they don't give me 60mg  of morphine like they did last time it will make my afib go up. They gave me Dilaudid and Morphine last time." Also speaking negatively about the staff. Pt admits to drug use. Track marks noted to forearm.

## 2014-11-10 NOTE — ED Notes (Signed)
Pt reports infection and pain to the second toe of the left foot.  Pt has been seen previously for it but sts "Nobody has been able to do anything about it."  Pt denies any fever, vomiting or diarrhea.  Pt confirms some mild nausea.

## 2014-11-10 NOTE — Progress Notes (Signed)
ANTICOAGULATION CONSULT NOTE - Initial Consult  Pharmacy Consult for Coumadin Indication: atrial fibrillation  Allergies  Allergen Reactions  . Antihistamines, Chlorpheniramine-Type Other (See Comments)    unknown  . Iodinated Diagnostic Agents Other (See Comments)    Patient states he doesn't know what iodine is and doesn't think he is allergic to it despite it being listed with his allergies  . Lidocaine Itching    Patient is uncertain of this allergy  . Methadone Other (See Comments)    "I won't take it anymore"  . Novolog [Insulin Aspart]     "I'm just highly allergic"  . Other Hives    Steroid creams  . Pentazocine Lactate Other (See Comments)    unknown  . Sulfa Antibiotics Itching and Other (See Comments)    whelps  . Vicodin [Hydrocodone-Acetaminophen] Palpitations and Other (See Comments)    Doesn't want to take    Patient Measurements:   Vital Signs: Temp: 99.5 F (37.5 C) (05/16 1840) Temp Source: Oral (05/16 1840) BP: 111/98 mmHg (05/16 2201) Pulse Rate: 54 (05/16 2030)  Labs:  Recent Labs  11/10/14 2003  HGB 11.4*  HCT 33.8*  PLT 233  CREATININE 2.89*    Estimated Creatinine Clearance: 46.3 mL/min (by C-G formula based on Cr of 2.89).   Medical History: Past Medical History  Diagnosis Date  . PAF (paroxysmal atrial fibrillation)   . Diabetes mellitus   . Hypothyroidism   . Hyperlipidemia   . Hypertension   . Chronic pain   . OA (osteoarthritis)   . COPD (chronic obstructive pulmonary disease)   . RA (rheumatoid arthritis)   . Hepatitis C   . Venous stasis   . Bipolar 1 disorder   . Hepatitis C   . Fibromyalgia   . Dysrhythmia     PAF  . Asthma   . Anxiety   . Melanoma     face, shoulder arm  . Pancreatitis, acute 2015  . GERD (gastroesophageal reflux disease)   . CKD (chronic kidney disease), stage II     Medications:  Prescriptions prior to admission  Medication Sig Dispense Refill Last Dose  . amLODipine (NORVASC) 5 MG  tablet Take 5 mg by mouth daily.   11/10/2014 at Unknown time  . cholecalciferol (VITAMIN D) 1000 UNITS tablet Take 2,000 Units by mouth every evening.    11/09/2014 at Unknown time  . COUMADIN 10 MG tablet Take 1 tablet (10 mg total) by mouth as directed. (Patient taking differently: Take 10 mg by mouth as directed. Monday, Wednesday, Friday and Saturday - alternating with 7.5mg ) 30 tablet 1 11/08/2014 at 6p  . COUMADIN 7.5 MG tablet Take 1 tablet (7.5 mg total) by mouth as directed. (Patient taking differently: Take 7.5 mg by mouth as directed. Sunday, Tuesday, Thursday - alternating with 10mg ) 30 tablet 1 11/06/2014 at 6p  . diazepam (VALIUM) 10 MG tablet Take 10 mg by mouth 3 (three) times daily. For anxiety   11/10/2014 at Unknown time  . fluticasone (FLONASE) 50 MCG/ACT nasal spray Place 1 spray into both nostrils daily as needed for allergies.    11/09/2014 at Unknown time  . furosemide (LASIX) 80 MG tablet Take 1 tablet (80 mg total) by mouth daily. 10 tablet 0 11/10/2014 at Unknown time  . furosemide (LASIX) 80 MG tablet Take 40 mg by mouth daily.   11/10/2014 at Unknown time  . insulin glargine (LANTUS) 100 UNIT/ML injection Inject 28 Units into the skin at bedtime.   11/09/2014 at  Unknown time  . levothyroxine (SYNTHROID, LEVOTHROID) 88 MCG tablet Take 1 tablet (88 mcg total) by mouth daily. 30 tablet 3 11/10/2014 at Unknown time  . lisinopril (PRINIVIL,ZESTRIL) 40 MG tablet Take 40 mg by mouth every morning.   0 11/10/2014 at Unknown time  . loratadine (CLARITIN) 10 MG tablet Take 1 tablet (10 mg total) by mouth daily as needed for allergies. 30 tablet 0 11/10/2014 at Unknown time  . metoprolol (LOPRESSOR) 50 MG tablet Take 1 tablet (50 mg total) by mouth daily. 30 tablet 5 11/10/2014 at 8a  . Multiple Vitamin (MULTIVITAMIN) tablet Take 1 tablet by mouth every morning.    11/10/2014 at Unknown time  . omeprazole (PRILOSEC) 40 MG capsule Take 1 capsule (40 mg total) by mouth daily. 30 capsule 5 11/10/2014  at Unknown time  . potassium chloride SA (K-DUR,KLOR-CON) 20 MEQ tablet Take 2 tablets (40 mEq total) by mouth daily. 20 tablet 0 11/10/2014 at Unknown time  . potassium chloride SA (K-DUR,KLOR-CON) 20 MEQ tablet Take 40 mEq by mouth daily.   11/10/2014 at Unknown time  . simvastatin (ZOCOR) 20 MG tablet Take 1 tablet (20 mg total) by mouth daily. 30 tablet 5 11/09/2014 at Unknown time  . tiotropium (SPIRIVA) 18 MCG inhalation capsule Place 1 capsule (18 mcg total) into inhaler and inhale daily. 30 capsule 0 11/10/2014 at Unknown time    Assessment: 59 y.o. male admitted with L foot osteomyelitis, h/o Afib, to continue Coumadin Goal of Therapy:  INR 2-3 Monitor platelets by anticoagulation protocol: Yes   Plan:   Coumadin 10 mg now Daily INR  Tashima Scarpulla, Bronson Curb 11/10/2014,11:35 PM

## 2014-11-10 NOTE — ED Notes (Signed)
Admitting MD at bedside.

## 2014-11-10 NOTE — ED Provider Notes (Signed)
CSN: [id number]     Arrival date & time 11/10/14  1827 History   First MD Initiated Contact with Patient 11/10/14 1830     Chief Complaint  Patient presents with  . Foot Pain     (Consider location/radiation/quality/duration/timing/severity/associated sxs/prior Treatment) HPI Johnny Sims is a 59 y.o. male with history of diabetes, A. fib, hypertension, COPD, rheumatoid arthritis, presents to emergency department complaining of left foot pain. Patient states he has had osteomyelitis in the left second toe for multiple weeks. He states he has been admitted several times, diagnosed with renal failure, states "nobody would do anything with my toe." He states he did see Dr. Sharol Given in the office, but states that when he came to see him Dr. Sharol Given was out of town. He states pain and swelling is getting worse.  Past Medical History  Diagnosis Date  . PAF (paroxysmal atrial fibrillation)   . Diabetes mellitus   . Hypothyroidism   . Hyperlipidemia   . Hypertension   . Chronic pain   . OA (osteoarthritis)   . COPD (chronic obstructive pulmonary disease)   . RA (rheumatoid arthritis)   . Hepatitis C   . Venous stasis   . Bipolar 1 disorder   . Hepatitis C   . Fibromyalgia   . Dysrhythmia     PAF  . Asthma   . Anxiety   . Melanoma     face, shoulder arm  . Pancreatitis, acute 2015   Past Surgical History  Procedure Laterality Date  . Thyroidectomy    . Tonsillectomy    . US echocardiography  02/19/2010    EF 55-60%  . Skin cancer excision      melonoma  . Fracture surgery Left     arm  . Submandibular gland excision Left 12/25/2013    w/sialolithotomy  . Inguinal hernia repair Bilateral   . Hernia repair      umbicial  . Knee surgery      /H&P 02/20/2010  . Submandibular gland excision Left 12/25/2013    Procedure: SUBMANDIBULAR GLAND STONE AND CHRONIC SIALOLITHIASIS EXCISION;  Surgeon: Melida Quitter, MD;  Location: [facility] OR;  Service: ENT;  Laterality: Left;   Family History  Problem  Relation Age of Onset  . Hypertension Mother   . Alzheimer's disease Mother   . Heart disease Father    History  Substance Use Topics  . Smoking status: Current Every Day Smoker -- 1.00 packs/day for 43 years  . Smokeless tobacco: Not on file  . Alcohol Use: 0.6 oz/week    1 Cans of beer per week    Review of Systems  Constitutional: Negative for fever and chills.  Respiratory: Negative for cough, chest tightness and shortness of breath.   Cardiovascular: Negative for chest pain, palpitations and leg swelling.  Musculoskeletal: Positive for myalgias and arthralgias. Negative for neck pain and neck stiffness.  Skin: Negative for rash.  Allergic/Immunologic: Negative for immunocompromised state.  All other systems reviewed and are negative.     Allergies  Oxycodone hcl; Antihistamines, chlorpheniramine-type; Iodinated diagnostic agents; Lidocaine; Methadone; Novolog; Other; Pentazocine lactate; Sulfa antibiotics; and Vicodin  Home Medications   Prior to Admission medications   Medication Sig Start Date End Date Taking? Authorizing Provider  amLODipine (NORVASC) 5 MG tablet Take 5 mg by mouth daily.    Historical Provider, MD  amoxicillin-clavulanate (AUGMENTIN) 875-125 MG per tablet Take 1 tablet by mouth 2 (two) times daily. Patient not taking: Reported on 10/13/2014 09/24/14  Reyne Dumas, MD  cholecalciferol (VITAMIN D) 1000 UNITS tablet Take 2,000 Units by mouth every evening.     Historical Provider, MD  COUMADIN 10 MG tablet Take 1 tablet (10 mg total) by mouth as directed. Patient taking differently: Take 10 mg by mouth as directed. Monday, Wednesday, Friday and Saturday - alternating with 7.5mg  08/20/14   Thayer Headings, MD  COUMADIN 7.5 MG tablet Take 1 tablet (7.5 mg total) by mouth as directed. Patient taking differently: Take 7.5 mg by mouth as directed. Sunday, Tuesday, Thursday - alternating with 10mg  08/20/14   Thayer Headings, MD  diazepam (VALIUM) 10 MG tablet  Take 10 mg by mouth 3 (three) times daily. For anxiety    Historical Provider, MD  doxycycline (VIBRAMYCIN) 50 MG capsule Take 2 capsules (100 mg total) by mouth 2 (two) times daily. Patient not taking: Reported on 10/13/2014 09/24/14   Reyne Dumas, MD  fluticasone (FLONASE) 50 MCG/ACT nasal spray Place 1 spray into both nostrils daily as needed for allergies.     Historical Provider, MD  furosemide (LASIX) 80 MG tablet Take 1 tablet (80 mg total) by mouth daily. 11/05/14   Everlene Balls, MD  Insulin Pen Needle (SURE COMFORT PEN NEEDLES) 31G X 8 MM MISC Use insulin once nightly for E11.9 08/28/14   Lance Bosch, NP  LANTUS SOLOSTAR 100 UNIT/ML Solostar Pen INJECT 48 UNITS INTO SKIN AT BEDTIME 10/17/14   Tresa Garter, MD  levothyroxine (SYNTHROID, LEVOTHROID) 88 MCG tablet Take 1 tablet (88 mcg total) by mouth daily. 08/28/14   Lance Bosch, NP  lisinopril (PRINIVIL,ZESTRIL) 40 MG tablet Take 40 mg by mouth every morning.  07/25/14   Historical Provider, MD  loratadine (CLARITIN) 10 MG tablet Take 1 tablet (10 mg total) by mouth daily as needed for allergies. 08/16/14   Britt Bottom, NP  metoprolol (LOPRESSOR) 50 MG tablet Take 1 tablet (50 mg total) by mouth daily. 08/28/14   Lance Bosch, NP  Multiple Vitamin (MULTIVITAMIN) tablet Take 1 tablet by mouth every morning.     Historical Provider, MD  nicotine (NICODERM CQ - DOSED IN MG/24 HOURS) 21 mg/24hr patch Place 1 patch (21 mg total) onto the skin daily. Patient not taking: Reported on 10/13/2014 09/24/14   Reyne Dumas, MD  omeprazole (PRILOSEC) 40 MG capsule Take 1 capsule (40 mg total) by mouth daily. Patient not taking: Reported on 10/13/2014 08/28/14   Lance Bosch, NP  oxyCODONE (ROXICODONE) 30 MG immediate release tablet Take 1 tablet (30 mg total) by mouth 5 (five) times daily. 12/26/13   Melida Quitter, MD  potassium chloride (K-DUR) 10 MEQ tablet Take 2 tablets (20 mEq total) by mouth daily. 10/15/14   Veryl Speak, MD  potassium  chloride SA (K-DUR,KLOR-CON) 20 MEQ tablet Take 2 tablets (40 mEq total) by mouth daily. 11/05/14   Everlene Balls, MD  simvastatin (ZOCOR) 20 MG tablet Take 1 tablet (20 mg total) by mouth every evening. Patient not taking: Reported on 10/13/2014 08/16/14   Britt Bottom, NP  simvastatin (ZOCOR) 20 MG tablet Take 1 tablet (20 mg total) by mouth daily. 08/28/14   Lance Bosch, NP  tiotropium (SPIRIVA) 18 MCG inhalation capsule Place 1 capsule (18 mcg total) into inhaler and inhale daily. 10/13/14   Micheline Chapman, NP   BP 154/94 mmHg  Pulse 139  Temp(Src) 99.5 F (37.5 C) (Oral)  Resp 18  SpO2 98% Physical Exam  Constitutional: He is oriented to person, place,  and time. He appears well-developed and well-nourished. No distress.  Neck: Normal range of motion.  Musculoskeletal: He exhibits edema.  Swelling noted to the left foot. Mild erythema extending from 2nd toe into the foot and ankle. Foot warm to the touch and there is ttp over 2nd toe and dorsal foot. dp pulses present.   Neurological: He is alert and oriented to person, place, and time.  Skin: Skin is warm and dry.  Nursing note and vitals reviewed.   ED Course  Procedures (including critical care time) Labs Review Labs Reviewed  CBC WITH DIFFERENTIAL/PLATELET  BASIC METABOLIC PANEL  I-STAT CG4 LACTIC ACID, ED    Imaging Review No results found.   EKG Interpretation None      MDM   Final diagnoses:  None   Pt in ED with complaint of ongoing left 2nd toe osteomelitis, now with what appears to be foot cellulitis. It appears pt left AMA several times after admission and I read a note by Dr. Sharol Given where amputation was offered but pt said he had to "take care of some financial things" and ended up leaving the hospital. Pt states he tried to follow up in the office but Dr. Sharol Given was out of town... i am still not sure if he ever saw him or not, given pt changing story entire time while talking to me and using profound  language and cursing at me and Dr. Sharol Given and prior admitting physicians. I asked pt why he left, and if today he would be willing to stay, pt became very angry stating that he never said that he never refused surgery and that no one is doing anything for him. He then asked me to "get out of my room" why yelling loudly and still using curse words. I left the room. Will get labs, xray.   Pt was found to be in afib with RVR. Hx of afib. No improvement with IV fluid bolus. cardizem started. Also ordered vancomycin for infection. Spoke with Dr. Lorin Mercy, who will let Dr. Sharol Given know that he is admitted. Will admit to medicine for cellulitis and Afib with RVR.   Filed Vitals:   11/10/14 2330 11/11/14 0000 11/11/14 0055 11/11/14 0100  BP: 152/90 153/104 146/88 151/98  Pulse:      Temp:      TempSrc:      Resp: 22 14 17 17   SpO2:           Jeannett Senior, PA-C 11/11/14 0138  Daleen Bo, MD 11/12/14 [phone]

## 2014-11-10 NOTE — Progress Notes (Signed)
ANTIBIOTIC CONSULT NOTE - INITIAL  Pharmacy Consult for Vancomycin and Zosyn  Indication: osteomyelitis  Allergies  Allergen Reactions  . Antihistamines, Chlorpheniramine-Type Other (See Comments)    unknown  . Iodinated Diagnostic Agents Other (See Comments)    Patient states he doesn't know what iodine is and doesn't think he is allergic to it despite it being listed with his allergies  . Lidocaine Itching    Patient is uncertain of this allergy  . Methadone Other (See Comments)    "I won't take it anymore"  . Novolog [Insulin Aspart]     "I'm just highly allergic"  . Other Hives    Steroid creams  . Pentazocine Lactate Other (See Comments)    unknown  . Sulfa Antibiotics Itching and Other (See Comments)    whelps  . Vicodin [Hydrocodone-Acetaminophen] Palpitations and Other (See Comments)    Doesn't want to take    Patient Measurements:   Adjusted Body Weight: 120 kg  Vital Signs: Temp: 99.5 F (37.5 C) (05/16 1840) Temp Source: Oral (05/16 1840) BP: 111/98 mmHg (05/16 2201) Pulse Rate: 54 (05/16 2030) Intake/Output from previous day:   Intake/Output from this shift:    Labs:  Recent Labs  11/10/14 2003  WBC 11.9*  HGB 11.4*  PLT 233  CREATININE 2.89*   Estimated Creatinine Clearance: 46.3 mL/min (by C-G formula based on Cr of 2.89). No results for input(s): VANCOTROUGH, VANCOPEAK, VANCORANDOM, GENTTROUGH, GENTPEAK, GENTRANDOM, TOBRATROUGH, TOBRAPEAK, TOBRARND, AMIKACINPEAK, AMIKACINTROU, AMIKACIN in the last 72 hours.   Microbiology: No results found for this or any previous visit (from the past 720 hour(s)).  Medical History: Past Medical History  Diagnosis Date  . PAF (paroxysmal atrial fibrillation)   . Diabetes mellitus   . Hypothyroidism   . Hyperlipidemia   . Hypertension   . Chronic pain   . OA (osteoarthritis)   . COPD (chronic obstructive pulmonary disease)   . RA (rheumatoid arthritis)   . Hepatitis C   . Venous stasis   . Bipolar  1 disorder   . Hepatitis C   . Fibromyalgia   . Dysrhythmia     PAF  . Asthma   . Anxiety   . Melanoma     face, shoulder arm  . Pancreatitis, acute 2015  . GERD (gastroesophageal reflux disease)   . CKD (chronic kidney disease), stage II     Assessment: 59 yo male with L foot osteomyelitis for empiric antibitoics  Vancomycin 1 g IV given in ED at  2120  Goal of Therapy:  Vancomycin trough level 15-20 mcg/ml  Plan:  Vancomycin 1500 mg IV now (in addition to 1 g already given), then 1500 IV q24h Zosyn 3.375 g IV q8h   Caryl Pina 11/10/2014,11:30 PM

## 2014-11-11 ENCOUNTER — Inpatient Hospital Stay (HOSPITAL_COMMUNITY): Payer: Medicaid Other | Admitting: Certified Registered Nurse Anesthetist

## 2014-11-11 ENCOUNTER — Ambulatory Visit (HOSPITAL_COMMUNITY): Payer: Medicaid Other

## 2014-11-11 ENCOUNTER — Encounter (HOSPITAL_COMMUNITY): Payer: Self-pay | Admitting: Cardiology

## 2014-11-11 ENCOUNTER — Encounter (HOSPITAL_COMMUNITY)
Admission: EM | Disposition: A | Discharge status: Home Health | Payer: Self-pay | Source: Home / Self Care | Attending: Internal Medicine

## 2014-11-11 DIAGNOSIS — I480 Paroxysmal atrial fibrillation: Secondary | ICD-10-CM

## 2014-11-11 DIAGNOSIS — I5033 Acute on chronic diastolic (congestive) heart failure: Secondary | ICD-10-CM

## 2014-11-11 DIAGNOSIS — N179 Acute kidney failure, unspecified: Secondary | ICD-10-CM

## 2014-11-11 DIAGNOSIS — I4891 Unspecified atrial fibrillation: Secondary | ICD-10-CM

## 2014-11-11 DIAGNOSIS — Z720 Tobacco use: Secondary | ICD-10-CM

## 2014-11-11 DIAGNOSIS — N184 Chronic kidney disease, stage 4 (severe): Secondary | ICD-10-CM

## 2014-11-11 HISTORY — PX: AMPUTATION: SHX166

## 2014-11-11 LAB — CBC
HCT: 30.5 % — ABNORMAL LOW (ref 39.0–52.0)
Hemoglobin: 10.2 g/dL — ABNORMAL LOW (ref 13.0–17.0)
MCH: 29.1 pg (ref 26.0–34.0)
MCHC: 33.4 g/dL (ref 30.0–36.0)
MCV: 86.9 fL (ref 78.0–100.0)
PLATELETS: 215 10*3/uL (ref 150–400)
RBC: 3.51 MIL/uL — ABNORMAL LOW (ref 4.22–5.81)
RDW: 14.8 % (ref 11.5–15.5)
WBC: 10.5 10*3/uL (ref 4.0–10.5)

## 2014-11-11 LAB — GLUCOSE, CAPILLARY
GLUCOSE-CAPILLARY: 191 mg/dL — AB (ref 65–99)
GLUCOSE-CAPILLARY: 62 mg/dL — AB (ref 65–99)
GLUCOSE-CAPILLARY: 78 mg/dL (ref 65–99)
Glucose-Capillary: 96 mg/dL (ref 65–99)
Glucose-Capillary: 66 mg/dL (ref 65–99)
Glucose-Capillary: 89 mg/dL (ref 65–99)
Glucose-Capillary: 78 mg/dL (ref 65–99)

## 2014-11-11 LAB — COMPREHENSIVE METABOLIC PANEL
ALK PHOS: 42 U/L (ref 38–126)
ALT: 15 U/L — ABNORMAL LOW (ref 17–63)
ANION GAP: 6 (ref 5–15)
AST: 18 U/L (ref 15–41)
Albumin: 1.4 g/dL — ABNORMAL LOW (ref 3.5–5.0)
BUN: 28 mg/dL — AB (ref 6–20)
CO2: 22 mmol/L (ref 22–32)
Calcium: 7.4 mg/dL — ABNORMAL LOW (ref 8.9–10.3)
Chloride: 111 mmol/L (ref 101–111)
Creatinine, Ser: 2.86 mg/dL — ABNORMAL HIGH (ref 0.61–1.24)
GFR, EST AFRICAN AMERICAN: 26 mL/min — AB (ref >60)
GFR, EST NON AFRICAN AMERICAN: 23 mL/min — AB (ref >60)
GLUCOSE: 170 mg/dL — AB (ref 65–99)
Potassium: 4.2 mmol/L (ref 3.5–5.1)
SODIUM: 139 mmol/L (ref 135–145)
Total Bilirubin: 0.4 mg/dL (ref 0.3–1.2)
Total Protein: 4.8 g/dL — ABNORMAL LOW (ref 6.5–8.1)

## 2014-11-11 LAB — TYPE AND SCREEN
ABO/RH(D): O NEG
Antibody Screen: NEGATIVE

## 2014-11-11 LAB — MRSA PCR SCREENING: MRSA by PCR: NEGATIVE

## 2014-11-11 LAB — SEDIMENTATION RATE: Sed Rate: 84 mm/hr — ABNORMAL HIGH (ref 0–16)

## 2014-11-11 LAB — LACTIC ACID, PLASMA
Lactic Acid, Venous: 0.6 mmol/L (ref 0.5–2.0)
Lactic Acid, Venous: 0.6 mmol/L (ref 0.5–2.0)

## 2014-11-11 LAB — APTT: aPTT: 38 seconds — ABNORMAL HIGH (ref 24–37)

## 2014-11-11 LAB — PROTIME-INR
INR: 1.7 — ABNORMAL HIGH (ref 0.00–1.49)
PROTHROMBIN TIME: 20.1 s — AB (ref 11.6–15.2)

## 2014-11-11 LAB — PROCALCITONIN: Procalcitonin: <0.1 ng/mL

## 2014-11-11 LAB — BRAIN NATRIURETIC PEPTIDE: B Natriuretic Peptide: 169.7 pg/mL — ABNORMAL HIGH (ref 0.0–100.0)

## 2014-11-11 LAB — C-REACTIVE PROTEIN: CRP: 4.5 mg/dL — AB (ref <1.0)

## 2014-11-11 LAB — CREATININE, URINE, RANDOM: CREATININE, URINE: 104.75 mg/dL

## 2014-11-11 SURGERY — AMPUTATION, FOOT, RAY
Anesthesia: General | Site: Foot | Laterality: Left

## 2014-11-11 MED ORDER — OXYCODONE HCL 5 MG PO TABS
30.0000 mg | ORAL_TABLET | ORAL | Status: DC | PRN
  Administered 2014-11-11: 30 mg via ORAL
  Filled 2014-11-11 (×2): qty 6

## 2014-11-11 MED ORDER — CETYLPYRIDINIUM CHLORIDE 0.05 % MT LIQD
7.0000 mL | Freq: Two times a day (BID) | OROMUCOSAL | Status: DC
  Administered 2014-11-11 – 2014-11-16 (×8): 7 mL via OROMUCOSAL

## 2014-11-11 MED ORDER — MEPERIDINE HCL 25 MG/ML IJ SOLN: 6.2500 mg | INTRAMUSCULAR | Status: DC | PRN

## 2014-11-11 MED ORDER — INSULIN REGULAR HUMAN 100 UNIT/ML IJ SOLN
0.0000 [IU] | Freq: Three times a day (TID) | INTRAMUSCULAR | Status: DC
  Administered 2014-11-13 – 2014-11-17 (×6): 1 [IU] via SUBCUTANEOUS
  Filled 2014-11-11 (×2): qty 10
  Filled 2014-11-11 (×2): qty 0.09
  Filled 2014-11-11 (×2): qty 10
  Filled 2014-11-11: qty 0.09
  Filled 2014-11-11 (×2): qty 10
  Filled 2014-11-11 (×2): qty 0.09
  Filled 2014-11-11: qty 10
  Filled 2014-11-11 (×2): qty 0.09
  Filled 2014-11-11 (×5): qty 10
  Filled 2014-11-11: qty 0.09
  Filled 2014-11-11: qty 10
  Filled 2014-11-11 (×5): qty 0.09
  Filled 2014-11-11: qty 10
  Filled 2014-11-11 (×2): qty 0.09
  Filled 2014-11-11 (×5): qty 10
  Filled 2014-11-11 (×4): qty 0.09
  Filled 2014-11-11: qty 10
  Filled 2014-11-11 (×7): qty 0.09
  Filled 2014-11-11: qty 10
  Filled 2014-11-11: qty 0.09

## 2014-11-11 MED ORDER — DEXTROSE 50 % IV SOLN
INTRAVENOUS | Status: AC
  Administered 2014-11-11: 50 mL
  Filled 2014-11-11: qty 50

## 2014-11-11 MED ORDER — HYDROMORPHONE HCL 1 MG/ML IJ SOLN
1.0000 mg | INTRAMUSCULAR | Status: DC | PRN
  Filled 2014-11-11: qty 1

## 2014-11-11 MED ORDER — WARFARIN SODIUM 10 MG PO TABS
10.0000 mg | ORAL_TABLET | Freq: Every day | ORAL | Status: AC
  Administered 2014-11-11: 10 mg via ORAL
  Filled 2014-11-11: qty 1

## 2014-11-11 MED ORDER — OXYCODONE HCL 5 MG PO TABS
20.0000 mg | ORAL_TABLET | Freq: Once | ORAL | Status: AC
  Administered 2014-11-11: 20 mg via ORAL
  Filled 2014-11-11: qty 4

## 2014-11-11 MED ORDER — METHOCARBAMOL 500 MG PO TABS
500.0000 mg | ORAL_TABLET | Freq: Four times a day (QID) | ORAL | Status: DC | PRN
  Administered 2014-11-12: 500 mg via ORAL
  Filled 2014-11-11: qty 1

## 2014-11-11 MED ORDER — FUROSEMIDE 10 MG/ML IJ SOLN
80.0000 mg | Freq: Two times a day (BID) | INTRAMUSCULAR | Status: DC
  Administered 2014-11-11: 80 mg via INTRAVENOUS
  Filled 2014-11-11 (×4): qty 8

## 2014-11-11 MED ORDER — MORPHINE SULFATE 2 MG/ML IJ SOLN
1.0000 mg | INTRAMUSCULAR | Status: DC | PRN
  Administered 2014-11-11 – 2014-11-14 (×2): 1 mg via INTRAVENOUS
  Filled 2014-11-11 (×2): qty 1

## 2014-11-11 MED ORDER — LIDOCAINE-EPINEPHRINE (PF) 1.5 %-1:200000 IJ SOLN
INTRAMUSCULAR | Status: DC | PRN
  Administered 2014-11-11: 30 mL via PERINEURAL

## 2014-11-11 MED ORDER — CHLORHEXIDINE GLUCONATE 4 % EX LIQD
60.0000 mL | Freq: Once | CUTANEOUS | Status: AC
  Administered 2014-11-11: 4 via TOPICAL
  Filled 2014-11-11: qty 60

## 2014-11-11 MED ORDER — WARFARIN SODIUM 10 MG PO TABS
10.0000 mg | ORAL_TABLET | Freq: Once | ORAL | Status: AC
Start: 2014-11-11 — End: 2014-11-11
  Administered 2014-11-11: 10 mg via ORAL
  Filled 2014-11-11: qty 1

## 2014-11-11 MED ORDER — ONDANSETRON HCL 4 MG PO TABS: 4.0000 mg | ORAL_TABLET | Freq: Four times a day (QID) | ORAL | Status: DC | PRN

## 2014-11-11 MED ORDER — HYDROMORPHONE HCL 1 MG/ML IJ SOLN: 0.2500 mg | INTRAMUSCULAR | Status: DC | PRN

## 2014-11-11 MED ORDER — METOCLOPRAMIDE HCL 5 MG/ML IJ SOLN
5.0000 mg | Freq: Three times a day (TID) | INTRAMUSCULAR | Status: DC | PRN
  Filled 2014-11-11: qty 2

## 2014-11-11 MED ORDER — OXYCODONE HCL 5 MG PO TABS
5.0000 mg | ORAL_TABLET | ORAL | Status: DC | PRN
  Administered 2014-11-11: 10 mg via ORAL

## 2014-11-11 MED ORDER — PERFLUTREN LIPID MICROSPHERE
1.0000 mL | INTRAVENOUS | Status: AC | PRN
  Administered 2014-11-11: 2 mL via INTRAVENOUS
  Filled 2014-11-11: qty 10

## 2014-11-11 MED ORDER — PROPOFOL 10 MG/ML IV BOLUS
INTRAVENOUS | Status: DC | PRN
  Administered 2014-11-11: 30 mg via INTRAVENOUS

## 2014-11-11 MED ORDER — WARFARIN - PHARMACIST DOSING INPATIENT
Freq: Every day | Status: DC
  Administered 2014-11-14 – 2014-11-16 (×3)

## 2014-11-11 MED ORDER — CHLORHEXIDINE GLUCONATE 0.12 % MT SOLN
15.0000 mL | Freq: Two times a day (BID) | OROMUCOSAL | Status: DC
  Administered 2014-11-12 – 2014-11-17 (×8): 15 mL via OROMUCOSAL
  Filled 2014-11-11 (×14): qty 15

## 2014-11-11 MED ORDER — 0.9 % SODIUM CHLORIDE (POUR BTL) OPTIME
TOPICAL | Status: DC | PRN
Start: 2014-11-11 — End: 2014-11-11
  Administered 2014-11-11: 1000 mL

## 2014-11-11 MED ORDER — SODIUM CHLORIDE 0.9 % IV SOLN
INTRAVENOUS | Status: DC
  Administered 2014-11-11: 23:00:00 via INTRAVENOUS

## 2014-11-11 MED ORDER — METHOCARBAMOL 1000 MG/10ML IJ SOLN: 500.0000 mg | Freq: Four times a day (QID) | INTRAVENOUS | Status: DC | PRN

## 2014-11-11 MED ORDER — DEXTROSE 5 % IV SOLN: 3.0000 g | INTRAVENOUS | Status: DC

## 2014-11-11 MED ORDER — METOCLOPRAMIDE HCL 5 MG PO TABS
5.0000 mg | ORAL_TABLET | Freq: Three times a day (TID) | ORAL | Status: DC | PRN
  Filled 2014-11-11: qty 2

## 2014-11-11 MED ORDER — FENTANYL CITRATE (PF) 100 MCG/2ML IJ SOLN
INTRAMUSCULAR | Status: DC | PRN
  Administered 2014-11-11: 100 ug via INTRAVENOUS

## 2014-11-11 MED ORDER — ATORVASTATIN CALCIUM 10 MG PO TABS
10.0000 mg | ORAL_TABLET | Freq: Every day | ORAL | Status: DC
  Administered 2014-11-12 – 2014-11-16 (×5): 10 mg via ORAL
  Filled 2014-11-11 (×6): qty 1

## 2014-11-11 MED ORDER — MIDAZOLAM HCL 5 MG/5ML IJ SOLN
INTRAMUSCULAR | Status: DC | PRN
  Administered 2014-11-11: 2 mg via INTRAVENOUS

## 2014-11-11 MED ORDER — DIAZEPAM 5 MG PO TABS
10.0000 mg | ORAL_TABLET | Freq: Three times a day (TID) | ORAL | Status: DC | PRN
  Administered 2014-11-11 – 2014-11-17 (×11): 10 mg via ORAL
  Filled 2014-11-11 (×11): qty 2

## 2014-11-11 MED ORDER — BUPIVACAINE-EPINEPHRINE (PF) 0.5% -1:200000 IJ SOLN
INTRAMUSCULAR | Status: DC | PRN
  Administered 2014-11-11: 30 mL via PERINEURAL

## 2014-11-11 MED ORDER — DILTIAZEM HCL 100 MG IV SOLR
100.0000 mg | INTRAVENOUS | Status: DC | PRN
  Administered 2014-11-11: 5 mg via INTRAVENOUS

## 2014-11-11 MED ORDER — CEFAZOLIN SODIUM-DEXTROSE 2-3 GM-% IV SOLR
INTRAVENOUS | Status: AC
  Filled 2014-11-11: qty 150

## 2014-11-11 MED ORDER — ONDANSETRON HCL 4 MG/2ML IJ SOLN: 4.0000 mg | Freq: Four times a day (QID) | INTRAMUSCULAR | Status: DC | PRN

## 2014-11-11 MED ORDER — SODIUM CHLORIDE 0.45 % IV SOLN
INTRAVENOUS | Status: DC
  Administered 2014-11-11: 09:00:00 via INTRAVENOUS

## 2014-11-11 MED ORDER — LEVALBUTEROL HCL 1.25 MG/0.5ML IN NEBU
1.2500 mg | INHALATION_SOLUTION | Freq: Once | RESPIRATORY_TRACT | Status: AC
  Administered 2014-11-12: 1.25 mg via RESPIRATORY_TRACT
  Filled 2014-11-11: qty 0.5

## 2014-11-11 MED ORDER — PROPOFOL INFUSION 10 MG/ML OPTIME
INTRAVENOUS | Status: DC | PRN
  Administered 2014-11-11: 25 ug/kg/min via INTRAVENOUS

## 2014-11-11 MED ORDER — ONDANSETRON HCL 4 MG/2ML IJ SOLN: 4.0000 mg | Freq: Once | INTRAMUSCULAR | Status: AC | PRN

## 2014-11-11 MED ORDER — MIDAZOLAM HCL 2 MG/2ML IJ SOLN
INTRAMUSCULAR | Status: AC
  Filled 2014-11-11: qty 2

## 2014-11-11 MED ORDER — INSULIN REGULAR HUMAN 100 UNIT/ML IJ SOLN
0.0000 [IU] | Freq: Every day | INTRAMUSCULAR | Status: DC
  Filled 2014-11-11: qty 10
  Filled 2014-11-11 (×3): qty 0.05
  Filled 2014-11-11 (×6): qty 10
  Filled 2014-11-11: qty 0.05
  Filled 2014-11-11: qty 10
  Filled 2014-11-11 (×3): qty 0.05
  Filled 2014-11-11: qty 10

## 2014-11-11 MED ORDER — SODIUM CHLORIDE 0.9 % IV SOLN
INTRAVENOUS | Status: DC | PRN
  Administered 2014-11-11: 19:00:00 via INTRAVENOUS

## 2014-11-11 SURGICAL SUPPLY — 38 items
BLADE SAW SGTL MED 73X18.5 STR (BLADE) IMPLANT
BNDG COHESIVE 4X5 TAN STRL (GAUZE/BANDAGES/DRESSINGS) ×3 IMPLANT
BNDG COHESIVE 6X5 TAN STRL LF (GAUZE/BANDAGES/DRESSINGS) ×2 IMPLANT
BNDG GAUZE ELAST 4 BULKY (GAUZE/BANDAGES/DRESSINGS) ×3 IMPLANT
COVER SURGICAL LIGHT HANDLE (MISCELLANEOUS) ×6 IMPLANT
DRAPE U-SHAPE 47X51 STRL (DRAPES) ×6 IMPLANT
DRSG ADAPTIC 3X8 NADH LF (GAUZE/BANDAGES/DRESSINGS) ×3 IMPLANT
DRSG PAD ABDOMINAL 8X10 ST (GAUZE/BANDAGES/DRESSINGS) ×6 IMPLANT
DURAPREP 26ML APPLICATOR (WOUND CARE) ×3 IMPLANT
ELECT REM PT RETURN 9FT ADLT (ELECTROSURGICAL) ×3
ELECTRODE REM PT RTRN 9FT ADLT (ELECTROSURGICAL) ×1 IMPLANT
GAUZE SPONGE 4X4 12PLY STRL (GAUZE/BANDAGES/DRESSINGS) ×5 IMPLANT
GLOVE BIO SURGEON STRL SZ 6.5 (GLOVE) ×1 IMPLANT
GLOVE BIO SURGEON STRL SZ7 (GLOVE) ×2 IMPLANT
GLOVE BIO SURGEONS STRL SZ 6.5 (GLOVE) ×1
GLOVE BIOGEL PI IND STRL 7.0 (GLOVE) IMPLANT
GLOVE BIOGEL PI IND STRL 9 (GLOVE) ×1 IMPLANT
GLOVE BIOGEL PI INDICATOR 7.0 (GLOVE) ×2
GLOVE BIOGEL PI INDICATOR 9 (GLOVE) ×2
GLOVE SURG ORTHO 9.0 STRL STRW (GLOVE) ×3 IMPLANT
GOWN STRL REUS W/ TWL LRG LVL3 (GOWN DISPOSABLE) ×1 IMPLANT
GOWN STRL REUS W/ TWL XL LVL3 (GOWN DISPOSABLE) ×2 IMPLANT
GOWN STRL REUS W/TWL LRG LVL3 (GOWN DISPOSABLE) ×3
GOWN STRL REUS W/TWL XL LVL3 (GOWN DISPOSABLE) ×6
KIT BASIN OR (CUSTOM PROCEDURE TRAY) ×3 IMPLANT
KIT ROOM TURNOVER OR (KITS) ×3 IMPLANT
NS IRRIG 1000ML POUR BTL (IV SOLUTION) ×3 IMPLANT
PACK ORTHO EXTREMITY (CUSTOM PROCEDURE TRAY) ×3 IMPLANT
PAD ABD 8X10 STRL (GAUZE/BANDAGES/DRESSINGS) ×4 IMPLANT
PAD ARMBOARD 7.5X6 YLW CONV (MISCELLANEOUS) ×6 IMPLANT
SPONGE GAUZE 4X4 12PLY STER LF (GAUZE/BANDAGES/DRESSINGS) ×2 IMPLANT
SPONGE LAP 18X18 X RAY DECT (DISPOSABLE) ×3 IMPLANT
STOCKINETTE IMPERVIOUS LG (DRAPES) IMPLANT
SUT ETHILON 2 0 PSLX (SUTURE) ×6 IMPLANT
TOWEL OR 17X24 6PK STRL BLUE (TOWEL DISPOSABLE) ×3 IMPLANT
TOWEL OR 17X26 10 PK STRL BLUE (TOWEL DISPOSABLE) ×3 IMPLANT
UNDERPAD 30X30 INCONTINENT (UNDERPADS AND DIAPERS) ×3 IMPLANT
WATER STERILE IRR 1000ML POUR (IV SOLUTION) ×3 IMPLANT

## 2014-11-11 NOTE — Consult Note (Signed)
Primary cardiologist: Dr Acie Fredrickson  HPI: 59 year old male with past medical history of paroxysmal atrial fibrillation, diabetes mellitus, hypertension, hyperlipidemia for evaluation of atrial fibrillation and preoperative assessment prior to amputation of second digit left lower extremity. Nuclear study 2008 showed ejection fraction 55%, diaphragmatic attenuation, no ischemia. Echocardiogram August 2011 showed normal LV function. There was mild left ventricular hypertrophy. Electrocardiogram 10/15/2014 showed sinus tachycardia. 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 Amputation is planned. Also noted to be in atrial fibrillation and cardiology asked to evaluate. Patient has chronic pedal edema that has worsened recently by his report. He has some dyspnea on exertion but states he can walk 1-2 miles with no symptoms. He does not have chest pain. He denies palpitations or syncope. Note patient has been seen in the emergency room recently for lower extremity edema. Venous Dopplers negative.  Medications Prior to Admission  Medication Sig Dispense Refill  . amLODipine (NORVASC) 5 MG tablet Take 5 mg by mouth daily.    . cholecalciferol (VITAMIN D) 1000 UNITS tablet Take 2,000 Units by mouth every evening.     Marland Kitchen COUMADIN 10 MG tablet Take 1 tablet (10 mg total) by mouth as directed. (Patient taking differently: Take 10 mg by mouth as directed. Monday, Wednesday, Friday and Saturday - alternating with 7.5mg ) 30 tablet 1  . COUMADIN 7.5 MG tablet Take 1 tablet (7.5 mg total) by mouth as directed. (Patient taking differently: Take 7.5 mg by mouth as directed. Sunday, Tuesday, Thursday - alternating with 10mg ) 30 tablet 1  . diazepam (VALIUM) 10 MG tablet Take 10 mg by mouth 3 (three) times daily. For anxiety    . fluticasone (FLONASE) 50 MCG/ACT nasal spray Place 1 spray into both nostrils daily as needed for allergies.     . furosemide (LASIX) 80 MG tablet Take 1 tablet (80  mg total) by mouth daily. 10 tablet 0  . furosemide (LASIX) 80 MG tablet Take 40 mg by mouth daily.    . insulin glargine (LANTUS) 100 UNIT/ML injection Inject 28 Units into the skin at bedtime.    Marland Kitchen levothyroxine (SYNTHROID, LEVOTHROID) 88 MCG tablet Take 1 tablet (88 mcg total) by mouth daily. 30 tablet 3  . lisinopril (PRINIVIL,ZESTRIL) 40 MG tablet Take 40 mg by mouth every morning.   0  . metoprolol (LOPRESSOR) 50 MG tablet Take 1 tablet (50 mg total) by mouth daily. 30 tablet 5  . Multiple Vitamin (MULTIVITAMIN) tablet Take 1 tablet by mouth every morning.     Marland Kitchen omeprazole (PRILOSEC) 40 MG capsule Take 1 capsule (40 mg total) by mouth daily. 30 capsule 5  . oxycodone (ROXICODONE) 30 MG immediate release tablet Take 30 mg by mouth 5 (five) times daily.    . potassium chloride SA (K-DUR,KLOR-CON) 20 MEQ tablet Take 2 tablets (40 mEq total) by mouth daily. 20 tablet 0  . potassium chloride SA (K-DUR,KLOR-CON) 20 MEQ tablet Take 40 mEq by mouth daily.    . simvastatin (ZOCOR) 20 MG tablet Take 1 tablet (20 mg total) by mouth daily. 30 tablet 5  . tiotropium (SPIRIVA) 18 MCG inhalation capsule Place 1 capsule (18 mcg total) into inhaler and inhale daily. 30 capsule 0  . loratadine (CLARITIN) 10 MG tablet Take 1 tablet (10 mg total) by mouth daily as needed for allergies. (Patient not taking: Reported on 11/11/2014) 30 tablet 0    Allergies  Allergen Reactions  . Antihistamines, Chlorpheniramine-Type Other (See Comments)    unknown  .  Iodinated Diagnostic Agents Other (See Comments)    Patient states he doesn't know what iodine is and doesn't think he is allergic to it despite it being listed with his allergies  . Lidocaine Itching    Patient is uncertain of this allergy  . Methadone Other (See Comments)    "I won't take it anymore"  . Novolog [Insulin Aspart]     "I'm just highly allergic"  . Other Hives    Steroid creams  . Pentazocine Lactate Other (See Comments)    unknown  . Sulfa  Antibiotics Itching and Other (See Comments)    whelps  . Vicodin [Hydrocodone-Acetaminophen] Palpitations and Other (See Comments)    Doesn't want to take     Past Medical History  Diagnosis Date  . PAF (paroxysmal atrial fibrillation)   . Diabetes mellitus   . Hypothyroidism   . Hyperlipidemia   . Hypertension   . Chronic pain   . OA (osteoarthritis)   . COPD (chronic obstructive pulmonary disease)   . RA (rheumatoid arthritis)   . Hepatitis C   . Venous stasis   . Bipolar 1 disorder   . Hepatitis C   . Fibromyalgia   . Dysrhythmia     PAF  . Asthma   . Anxiety   . Melanoma     face, shoulder arm  . Pancreatitis, acute 2015  . GERD (gastroesophageal reflux disease)   . CKD (chronic kidney disease), stage II     Past Surgical History  Procedure Laterality Date  . Thyroidectomy    . Tonsillectomy    . US echocardiography  02/19/2010    EF 55-60%  . Skin cancer excision      melonoma  . Fracture surgery Left     arm  . Submandibular gland excision Left 12/25/2013    w/sialolithotomy  . Inguinal hernia repair Bilateral   . Hernia repair      umbicial  . Knee surgery      /H&P 02/20/2010  . Submandibular gland excision Left 12/25/2013    Procedure: SUBMANDIBULAR GLAND STONE AND CHRONIC SIALOLITHIASIS EXCISION;  Surgeon: Melida Quitter, MD;  Location: [facility];  Service: ENT;  Laterality: Left;    History   Social History  . Marital Status: Single    Spouse Name: N/A  . Number of Children: N/A  . Years of Education: N/A   Occupational History  . Not on file.   Social History Main Topics  . Smoking status: Current Every Day Smoker -- 1.00 packs/day for 43 years  . Smokeless tobacco: Not on file  . Alcohol Use: 0.6 oz/week    1 Cans of beer per week  . Drug Use: No  . Sexual Activity: Not on file   Other Topics Concern  . Not on file   Social History Narrative    Family History  Problem Relation Age of Onset  . Hypertension Mother   . Alzheimer's  disease Mother   . Heart disease Father     ROS:  Toe pain but no fevers or chills, productive cough, hemoptysis, dysphasia, odynophagia, melena, hematochezia, dysuria, hematuria, rash, seizure activity, PND, claudication. Remaining systems are negative.  Physical Exam:   Blood pressure 142/100, pulse 54, temperature 98.2 F (36.8 C), temperature source Oral, resp. rate 21, height $RemoveBe'6\' 9"'[id number]$  (2.057 m), weight 324 lb 1.2 oz (147 kg), SpO2 97 %.  General:  Well developed/well nourished in NAD Skin warm/dry Patient not depressed No peripheral clubbing Back-normal HEENT-normal/normal eyelids Neck  supple/normal carotid upstroke bilaterally; no bruits; positive JVD; no thyromegaly chest - diffuse rhonchi CV - irregular and tachycardic/normal S1 and S2; no murmurs, rubs or gallops;  PMI nondisplaced Abdomen -NT/ND, no HSM, no mass, + bowel sounds, no bruit Femoral pulses difficult to palpate Ext-2-3+ edema, 2+ DP, infected second digit left lower extremity. Associated erythema. Neuro-grossly nonfocal  ECG atrial fibrillation with no ST changes.  Results for orders placed or performed during the hospital encounter of 11/10/14 (from the past 48 hour(s))  Creatinine, urine, random     Status: None   Collection Time: 11/10/14 12:56 AM  Result Value Ref Range   Creatinine, Urine 104.75 mg/dL  CBC with Differential     Status: Abnormal   Collection Time: 11/10/14  8:03 PM  Result Value Ref Range   WBC 11.9 (H) 4.0 - 10.5 K/uL   RBC 3.92 (L) 4.22 - 5.81 MIL/uL   Hemoglobin 11.4 (L) 13.0 - 17.0 g/dL   HCT 33.8 (L) 39.0 - 52.0 %   MCV 86.2 78.0 - 100.0 fL   MCH 29.1 26.0 - 34.0 pg   MCHC 33.7 30.0 - 36.0 g/dL   RDW 14.8 11.5 - 15.5 %   Platelets 233 150 - 400 K/uL   Neutrophils Relative % 71 43 - 77 %   Lymphocytes Relative 20 12 - 46 %   Monocytes Relative 8 3 - 12 %   Eosinophils Relative 1 0 - 5 %   Basophils Relative 0 0 - 1 %   Neutro Abs 8.4 (H) 1.7 - 7.7 K/uL   Lymphs Abs 2.4 0.7  - 4.0 K/uL   Monocytes Absolute 1.0 0.1 - 1.0 K/uL   Eosinophils Absolute 0.1 0.0 - 0.7 K/uL   Basophils Absolute 0.0 0.0 - 0.1 K/uL   WBC Morphology ATYPICAL LYMPHOCYTES    Smear Review LARGE PLATELETS PRESENT   Basic metabolic panel     Status: Abnormal   Collection Time: 11/10/14  8:03 PM  Result Value Ref Range   Sodium 139 135 - 145 mmol/L   Potassium 4.8 3.5 - 5.1 mmol/L   Chloride 109 101 - 111 mmol/L   CO2 21 (L) 22 - 32 mmol/L   Glucose, Bld 131 (H) 65 - 99 mg/dL   BUN 29 (H) 6 - 20 mg/dL   Creatinine, Ser 2.89 (H) 0.61 - 1.24 mg/dL   Calcium 7.9 (L) 8.9 - 10.3 mg/dL   GFR calc non Af Amer 22 (L) >60 mL/min   GFR calc Af Amer 26 (L) >60 mL/min    Comment: (NOTE) The eGFR has been calculated using the CKD EPI equation. This calculation has not been validated in all clinical situations. eGFR's persistently <60 mL/min signify possible Chronic Kidney Disease.    Anion gap 9 5 - 15  I-Stat CG4 Lactic Acid, ED     Status: None   Collection Time: 11/10/14  8:10 PM  Result Value Ref Range   Lactic Acid, Venous 0.53 0.5 - 2.0 mmol/L  I-Stat Troponin, ED (not at [facility])     Status: None   Collection Time: 11/10/14 10:06 PM  Result Value Ref Range   Troponin i, poc 0.00 0.00 - 0.08 ng/mL   Comment 3            Comment: Due to the release kinetics of cTnI, a negative result within the first hours of the onset of symptoms does not rule out myocardial infarction with certainty. If myocardial infarction is still suspected, repeat the  test at appropriate intervals.   I-Stat CG4 Lactic Acid, ED     Status: Abnormal   Collection Time: 11/10/14 10:07 PM  Result Value Ref Range   Lactic Acid, Venous <0.30 (L) 0.5 - 2.0 mmol/L  MRSA PCR Screening     Status: None   Collection Time: 11/10/14 11:50 PM  Result Value Ref Range   MRSA by PCR NEGATIVE NEGATIVE    Comment:        The GeneXpert MRSA Assay (FDA approved for NASAL specimens only), is one component of a comprehensive MRSA  colonization surveillance program. It is not intended to diagnose MRSA infection nor to guide or monitor treatment for MRSA infections.   Protime-INR     Status: Abnormal   Collection Time: 11/11/14 12:10 AM  Result Value Ref Range   Prothrombin Time 20.1 (H) 11.6 - 15.2 seconds   INR 1.70 (H) 0.00 - 1.49  Type and screen     Status: None   Collection Time: 11/11/14 12:10 AM  Result Value Ref Range   ABO/RH(D) O NEG    Antibody Screen NEG    Sample Expiration 11/14/2014   Sedimentation rate     Status: Abnormal   Collection Time: 11/11/14 12:10 AM  Result Value Ref Range   Sed Rate 84 (H) 0 - 16 mm/hr  C-reactive protein     Status: Abnormal   Collection Time: 11/11/14 12:10 AM  Result Value Ref Range   CRP 4.5 (H) <1.0 mg/dL  Lactic acid, plasma     Status: None   Collection Time: 11/11/14 12:10 AM  Result Value Ref Range   Lactic Acid, Venous 0.6 0.5 - 2.0 mmol/L  Procalcitonin     Status: None   Collection Time: 11/11/14 12:10 AM  Result Value Ref Range   Procalcitonin <0.10 ng/mL    Comment:        Interpretation: PCT (Procalcitonin) <= 0.5 ng/mL: Systemic infection (sepsis) is not likely. Local bacterial infection is possible. (NOTE)         ICU PCT Algorithm               Non ICU PCT Algorithm    ----------------------------     ------------------------------         PCT < 0.25 ng/mL                 PCT < 0.1 ng/mL     Stopping of antibiotics            Stopping of antibiotics       strongly encouraged.               strongly encouraged.    ----------------------------     ------------------------------       PCT level decrease by               PCT < 0.25 ng/mL       >= 80% from peak PCT       OR PCT 0.25 - 0.5 ng/mL          Stopping of antibiotics                                             encouraged.     Stopping of antibiotics           encouraged.    ----------------------------     ------------------------------  PCT level decrease by               PCT >= 0.25 ng/mL       < 80% from peak PCT        AND PCT >= 0.5 ng/mL            Continuin g antibiotics                                              encouraged.       Continuing antibiotics            encouraged.    ----------------------------     ------------------------------     PCT level increase compared          PCT > 0.5 ng/mL         with peak PCT AND          PCT >= 0.5 ng/mL             Escalation of antibiotics                                          strongly encouraged.      Escalation of antibiotics        strongly encouraged.   APTT     Status: Abnormal   Collection Time: 11/11/14 12:10 AM  Result Value Ref Range   aPTT 38 (H) 24 - 37 seconds    Comment:        IF BASELINE aPTT IS ELEVATED, SUGGEST PATIENT RISK ASSESSMENT BE USED TO DETERMINE APPROPRIATE ANTICOAGULANT THERAPY.   Glucose, capillary     Status: Abnormal   Collection Time: 11/11/14 12:33 AM  Result Value Ref Range   Glucose-Capillary 191 (H) 65 - 99 mg/dL  Brain natriuretic peptide     Status: Abnormal   Collection Time: 11/11/14  2:05 AM  Result Value Ref Range   B Natriuretic Peptide 169.7 (H) 0.0 - 100.0 pg/mL  Comprehensive metabolic panel     Status: Abnormal   Collection Time: 11/11/14  2:05 AM  Result Value Ref Range   Sodium 139 135 - 145 mmol/L   Potassium 4.2 3.5 - 5.1 mmol/L   Chloride 111 101 - 111 mmol/L   CO2 22 22 - 32 mmol/L   Glucose, Bld 170 (H) 65 - 99 mg/dL   BUN 28 (H) 6 - 20 mg/dL   Creatinine, Ser 2.86 (H) 0.61 - 1.24 mg/dL   Calcium 7.4 (L) 8.9 - 10.3 mg/dL   Total Protein 4.8 (L) 6.5 - 8.1 g/dL   Albumin 1.4 (L) 3.5 - 5.0 g/dL   AST 18 15 - 41 U/L   ALT 15 (L) 17 - 63 U/L   Alkaline Phosphatase 42 38 - 126 U/L   Total Bilirubin 0.4 0.3 - 1.2 mg/dL   GFR calc non Af Amer 23 (L) >60 mL/min   GFR calc Af Amer 26 (L) >60 mL/min    Comment: (NOTE) The eGFR has been calculated using the CKD EPI equation. This calculation has not been validated in all clinical  situations. eGFR's persistently <60 mL/min signify possible Chronic Kidney Disease.    Anion gap 6 5 - 15  CBC     Status: Abnormal   Collection  Time: 11/11/14  2:05 AM  Result Value Ref Range   WBC 10.5 4.0 - 10.5 K/uL   RBC 3.51 (L) 4.22 - 5.81 MIL/uL   Hemoglobin 10.2 (L) 13.0 - 17.0 g/dL   HCT 30.5 (L) 39.0 - 52.0 %   MCV 86.9 78.0 - 100.0 fL   MCH 29.1 26.0 - 34.0 pg   MCHC 33.4 30.0 - 36.0 g/dL   RDW 14.8 11.5 - 15.5 %   Platelets 215 150 - 400 K/uL  Lactic acid, plasma     Status: None   Collection Time: 11/11/14  2:05 AM  Result Value Ref Range   Lactic Acid, Venous 0.6 0.5 - 2.0 mmol/L  Glucose, capillary     Status: None   Collection Time: 11/11/14  7:47 AM  Result Value Ref Range   Glucose-Capillary 96 65 - 99 mg/dL   Comment 1 Document in Chart     Dg Foot Complete Left  11/10/2014   CLINICAL DATA:  LEFT leg edema and swelling. Second toe swelling and color changes.  EXAM: LEFT FOOT - COMPLETE 3+ VIEW  COMPARISON:  None.  FINDINGS: There is diffuse soft tissue swelling around the second toe. The toe is flexed and the terminal phalanx is suboptimally visualized however there appears to be osteolysis of the terminal phalanx. The terminal phalanx is small and the appearance is compatible with osteomyelitis, particularly with gas in the adjacent soft tissues. The other toes appear within normal limits. There is no proximal tracking of gas in the foot.  IMPRESSION: Second toe osteomyelitis with adjacent soft tissue swelling and gas in the distal second toe.   Electronically Signed   By: Dereck Ligas M.D.   On: 11/10/2014 19:30    Assessment/Plan 1 paroxysmal atrial fibrillation-the patient has developed recurrent atrial fibrillation which appears to be relatively asymptomatic. Continue Cardizem and metoprolol for rate control. CHADS vasc 3. Would continue Coumadin once all procedures complete. Can consider cardioversion in the future if atrial fibrillation persists once  Coumadin is therapeutic and all procedures complete. 2 acute on chronic diastolic congestive heart failure-the patient is markedly volume overloaded on examination. Etiology of this most likely multifactorial. His renal function has declined. His albumin is 1.4. There may be a cardiac contribution from his atrial fibrillation. Will check echocardiogram for LV function. He needs diuresis with close follow-up of renal function. 3 preoperative evaluation prior to amputation of second digit left lower extremity-Patient has osteomyelitis/cellulitis which makes his procedure more urgent. He has reasonable functional capacity. It is a low risk procedure but should be done under local anesthesia (discussed with Dr Sharol Given). If any more complicated vascular procedures necessary he would need preoperative evaluation with nuclear study. He would also need to be tuned up further with diuresis and treatment of his congestive heart failure. 4 acute on chronic stage IV kidney failure-patient should most likely be seen by nephrology. Follow renal function closely with diuresis. 5 tobacco abuse-patient counseled on discontinuing. 6 hypertension-continue Cardizem and metoprolol. Follow blood pressure and adjust regimen as needed.  Kirk Ruths MD 11/11/2014, 10:04 AM

## 2014-11-11 NOTE — Consult Note (Signed)
Reason for Consult: Osteomyelitis left foot second toe Referring Physician: Hospitalist  Johnny Sims is an 59 y.o. male.  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 PAF (paroxysmal atrial fibrillation)   . Diabetes mellitus   . Hypothyroidism   . Hyperlipidemia   . Hypertension   . Chronic pain   . OA (osteoarthritis)   . COPD (chronic obstructive pulmonary disease)   . RA (rheumatoid arthritis)   . Hepatitis C   . Venous stasis   . Bipolar 1 disorder   . Hepatitis C   . Fibromyalgia   . Dysrhythmia     PAF  . Asthma   . Anxiety   . Melanoma     face, shoulder arm  . Pancreatitis, acute 2015  . GERD (gastroesophageal reflux disease)   . CKD (chronic kidney disease), stage II     Past Surgical History  Procedure Laterality Date  . Thyroidectomy    . Tonsillectomy    . US echocardiography  02/19/2010    EF 55-60%  . Skin cancer excision      melonoma  . Fracture surgery Left     arm  . Submandibular gland excision Left 12/25/2013    w/sialolithotomy  . Inguinal hernia repair Bilateral   . Hernia repair      umbicial  . Knee surgery      /H&P 02/20/2010  . Submandibular gland excision Left 12/25/2013    Procedure: SUBMANDIBULAR GLAND STONE AND CHRONIC SIALOLITHIASIS EXCISION;  Surgeon: Melida Quitter, MD;  Location: [facility] OR;  Service: ENT;  Laterality: Left;    Family History  Problem Relation Age of Onset  . Hypertension Mother   . Alzheimer's disease Mother   . Heart disease Father     Social History:  reports that he has been smoking.  He does not have any smokeless tobacco history on file. He reports that he drinks about 0.6 oz of alcohol per week. He reports that he does not use illicit drugs.  Allergies:  Allergies  Allergen Reactions  . Antihistamines, Chlorpheniramine-Type Other (See Comments)    unknown  . Iodinated Diagnostic  Agents Other (See Comments)    Patient states he doesn't know what iodine is and doesn't think he is allergic to it despite it being listed with his allergies  . Lidocaine Itching    Patient is uncertain of this allergy  . Methadone Other (See Comments)    "I won't take it anymore"  . Novolog [Insulin Aspart]     "I'm just highly allergic"  . Other Hives    Steroid creams  . Pentazocine Lactate Other (See Comments)    unknown  . Sulfa Antibiotics Itching and Other (See Comments)    whelps  . Vicodin [Hydrocodone-Acetaminophen] Palpitations and Other (See Comments)    Doesn't want to take    Medications: I have reviewed the patient's current medications.  Results for orders placed or performed during the hospital encounter of 11/10/14 (from the past 48 hour(s))  Creatinine, urine, random     Status: None   Collection Time: 11/10/14 12:56 AM  Result Value Ref Range   Creatinine, Urine 104.75 mg/dL  CBC with Differential     Status: Abnormal   Collection Time: 11/10/14  8:03 PM  Result Value Ref Range   WBC 11.9 (H) 4.0 - 10.5 K/uL   RBC 3.92 (L) 4.22 -  5.81 MIL/uL   Hemoglobin 11.4 (L) 13.0 - 17.0 g/dL   HCT 33.8 (L) 39.0 - 52.0 %   MCV 86.2 78.0 - 100.0 fL   MCH 29.1 26.0 - 34.0 pg   MCHC 33.7 30.0 - 36.0 g/dL   RDW 14.8 11.5 - 15.5 %   Platelets 233 150 - 400 K/uL   Neutrophils Relative % 71 43 - 77 %   Lymphocytes Relative 20 12 - 46 %   Monocytes Relative 8 3 - 12 %   Eosinophils Relative 1 0 - 5 %   Basophils Relative 0 0 - 1 %   Neutro Abs 8.4 (H) 1.7 - 7.7 K/uL   Lymphs Abs 2.4 0.7 - 4.0 K/uL   Monocytes Absolute 1.0 0.1 - 1.0 K/uL   Eosinophils Absolute 0.1 0.0 - 0.7 K/uL   Basophils Absolute 0.0 0.0 - 0.1 K/uL   WBC Morphology ATYPICAL LYMPHOCYTES    Smear Review LARGE PLATELETS PRESENT   Basic metabolic panel     Status: Abnormal   Collection Time: 11/10/14  8:03 PM  Result Value Ref Range   Sodium 139 135 - 145 mmol/L   Potassium 4.8 3.5 - 5.1 mmol/L    Chloride 109 101 - 111 mmol/L   CO2 21 (L) 22 - 32 mmol/L   Glucose, Bld 131 (H) 65 - 99 mg/dL   BUN 29 (H) 6 - 20 mg/dL   Creatinine, Ser 2.89 (H) 0.61 - 1.24 mg/dL   Calcium 7.9 (L) 8.9 - 10.3 mg/dL   GFR calc non Af Amer 22 (L) >60 mL/min   GFR calc Af Amer 26 (L) >60 mL/min    Comment: (NOTE) The eGFR has been calculated using the CKD EPI equation. This calculation has not been validated in all clinical situations. eGFR's persistently <60 mL/min signify possible Chronic Kidney Disease.    Anion gap 9 5 - 15  I-Stat CG4 Lactic Acid, ED     Status: None   Collection Time: 11/10/14  8:10 PM  Result Value Ref Range   Lactic Acid, Venous 0.53 0.5 - 2.0 mmol/L  I-Stat Troponin, ED (not at [facility])     Status: None   Collection Time: 11/10/14 10:06 PM  Result Value Ref Range   Troponin i, poc 0.00 0.00 - 0.08 ng/mL   Comment 3            Comment: Due to the release kinetics of cTnI, a negative result within the first hours of the onset of symptoms does not rule out myocardial infarction with certainty. If myocardial infarction is still suspected, repeat the test at appropriate intervals.   I-Stat CG4 Lactic Acid, ED     Status: Abnormal   Collection Time: 11/10/14 10:07 PM  Result Value Ref Range   Lactic Acid, Venous <0.30 (L) 0.5 - 2.0 mmol/L  MRSA PCR Screening     Status: None   Collection Time: 11/10/14 11:50 PM  Result Value Ref Range   MRSA by PCR NEGATIVE NEGATIVE    Comment:        The GeneXpert MRSA Assay (FDA approved for NASAL specimens only), is one component of a comprehensive MRSA colonization surveillance program. It is not intended to diagnose MRSA infection nor to guide or monitor treatment for MRSA infections.   Protime-INR     Status: Abnormal   Collection Time: 11/11/14 12:10 AM  Result Value Ref Range   Prothrombin Time 20.1 (H) 11.6 - 15.2 seconds   INR 1.70 (H)  0.00 - 1.49  Type and screen     Status: None   Collection Time: 11/11/14 12:10 AM   Result Value Ref Range   ABO/RH(D) O NEG    Antibody Screen NEG    Sample Expiration 11/14/2014   Sedimentation rate     Status: Abnormal   Collection Time: 11/11/14 12:10 AM  Result Value Ref Range   Sed Rate 84 (H) 0 - 16 mm/hr  C-reactive protein     Status: Abnormal   Collection Time: 11/11/14 12:10 AM  Result Value Ref Range   CRP 4.5 (H) <1.0 mg/dL  Lactic acid, plasma     Status: None   Collection Time: 11/11/14 12:10 AM  Result Value Ref Range   Lactic Acid, Venous 0.6 0.5 - 2.0 mmol/L  Procalcitonin     Status: None   Collection Time: 11/11/14 12:10 AM  Result Value Ref Range   Procalcitonin <0.10 ng/mL    Comment:        Interpretation: PCT (Procalcitonin) <= 0.5 ng/mL: Systemic infection (sepsis) is not likely. Local bacterial infection is possible. (NOTE)         ICU PCT Algorithm               Non ICU PCT Algorithm    ----------------------------     ------------------------------         PCT < 0.25 ng/mL                 PCT < 0.1 ng/mL     Stopping of antibiotics            Stopping of antibiotics       strongly encouraged.               strongly encouraged.    ----------------------------     ------------------------------       PCT level decrease by               PCT < 0.25 ng/mL       >= 80% from peak PCT       OR PCT 0.25 - 0.5 ng/mL          Stopping of antibiotics                                             encouraged.     Stopping of antibiotics           encouraged.    ----------------------------     ------------------------------       PCT level decrease by              PCT >= 0.25 ng/mL       < 80% from peak PCT        AND PCT >= 0.5 ng/mL            Continuin g antibiotics                                              encouraged.       Continuing antibiotics            encouraged.    ----------------------------     ------------------------------     PCT level increase compared  PCT > 0.5 ng/mL         with peak PCT AND          PCT  >= 0.5 ng/mL             Escalation of antibiotics                                          strongly encouraged.      Escalation of antibiotics        strongly encouraged.   APTT     Status: Abnormal   Collection Time: 11/11/14 12:10 AM  Result Value Ref Range   aPTT 38 (H) 24 - 37 seconds    Comment:        IF BASELINE aPTT IS ELEVATED, SUGGEST PATIENT RISK ASSESSMENT BE USED TO DETERMINE APPROPRIATE ANTICOAGULANT THERAPY.   Glucose, capillary     Status: Abnormal   Collection Time: 11/11/14 12:33 AM  Result Value Ref Range   Glucose-Capillary 191 (H) 65 - 99 mg/dL  Brain natriuretic peptide     Status: Abnormal   Collection Time: 11/11/14  2:05 AM  Result Value Ref Range   B Natriuretic Peptide 169.7 (H) 0.0 - 100.0 pg/mL  Comprehensive metabolic panel     Status: Abnormal   Collection Time: 11/11/14  2:05 AM  Result Value Ref Range   Sodium 139 135 - 145 mmol/L   Potassium 4.2 3.5 - 5.1 mmol/L   Chloride 111 101 - 111 mmol/L   CO2 22 22 - 32 mmol/L   Glucose, Bld 170 (H) 65 - 99 mg/dL   BUN 28 (H) 6 - 20 mg/dL   Creatinine, Ser 2.86 (H) 0.61 - 1.24 mg/dL   Calcium 7.4 (L) 8.9 - 10.3 mg/dL   Total Protein 4.8 (L) 6.5 - 8.1 g/dL   Albumin 1.4 (L) 3.5 - 5.0 g/dL   AST 18 15 - 41 U/L   ALT 15 (L) 17 - 63 U/L   Alkaline Phosphatase 42 38 - 126 U/L   Total Bilirubin 0.4 0.3 - 1.2 mg/dL   GFR calc non Af Amer 23 (L) >60 mL/min   GFR calc Af Amer 26 (L) >60 mL/min    Comment: (NOTE) The eGFR has been calculated using the CKD EPI equation. This calculation has not been validated in all clinical situations. eGFR's persistently <60 mL/min signify possible Chronic Kidney Disease.    Anion gap 6 5 - 15  CBC     Status: Abnormal   Collection Time: 11/11/14  2:05 AM  Result Value Ref Range   WBC 10.5 4.0 - 10.5 K/uL   RBC 3.51 (L) 4.22 - 5.81 MIL/uL   Hemoglobin 10.2 (L) 13.0 - 17.0 g/dL   HCT 30.5 (L) 39.0 - 52.0 %   MCV 86.9 78.0 - 100.0 fL   MCH 29.1 26.0 - 34.0 pg    MCHC 33.4 30.0 - 36.0 g/dL   RDW 14.8 11.5 - 15.5 %   Platelets 215 150 - 400 K/uL  Lactic acid, plasma     Status: None   Collection Time: 11/11/14  2:05 AM  Result Value Ref Range   Lactic Acid, Venous 0.6 0.5 - 2.0 mmol/L    Dg Foot Complete Left  11/10/2014   CLINICAL DATA:  LEFT leg edema and swelling. Second toe swelling and color changes.  EXAM: LEFT FOOT - COMPLETE 3+  VIEW  COMPARISON:  None.  FINDINGS: There is diffuse soft tissue swelling around the second toe. The toe is flexed and the terminal phalanx is suboptimally visualized however there appears to be osteolysis of the terminal phalanx. The terminal phalanx is small and the appearance is compatible with osteomyelitis, particularly with gas in the adjacent soft tissues. The other toes appear within normal limits. There is no proximal tracking of gas in the foot.  IMPRESSION: Second toe osteomyelitis with adjacent soft tissue swelling and gas in the distal second toe.   Electronically Signed   By: Dereck Ligas M.D.   On: 11/10/2014 19:30    Review of Systems  All other systems reviewed and are negative.  Blood pressure 142/100, pulse 54, temperature 98.1 F (36.7 C), temperature source Oral, resp. rate 21, height $RemoveBe'6\' 9"'DXDPyDcUl$  (2.057 m), weight 147 kg (324 lb 1.2 oz), SpO2 97 %. Physical Exam On examination patient has a strong dorsalis pedis pulse he has brawny skin color changes with lymphedema and venous insufficiency of both lower extremities but no cellulitis in his legs. Patient has sausage digit swelling of the second toe with ulceration and osteomyelitis. Radiographs confirm osteomyelitis. Assessment/Plan: Assessment: Cellulitis and osteomyelitis left foot second toe.  Plan: We will plan for second [place] amputation this evening after 5 PM. Patient is nothing by mouth. He will need to be strict nonweightbearing on the left foot postoperatively.  DUDA,MARCUS V 11/11/2014, 7:09 AM

## 2014-11-11 NOTE — H&P (View-Only) (Signed)
Reason for Consult: Osteomyelitis left foot second toe Referring Physician: Hospitalist  Johnny Sims is an 59 y.o. male.  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 PAF (paroxysmal atrial fibrillation)   . Diabetes mellitus   . Hypothyroidism   . Hyperlipidemia   . Hypertension   . Chronic pain   . OA (osteoarthritis)   . COPD (chronic obstructive pulmonary disease)   . RA (rheumatoid arthritis)   . Hepatitis C   . Venous stasis   . Bipolar 1 disorder   . Hepatitis C   . Fibromyalgia   . Dysrhythmia     PAF  . Asthma   . Anxiety   . Melanoma     face, shoulder arm  . Pancreatitis, acute 2015  . GERD (gastroesophageal reflux disease)   . CKD (chronic kidney disease), stage II     Past Surgical History  Procedure Laterality Date  . Thyroidectomy    . Tonsillectomy    . US echocardiography  02/19/2010    EF 55-60%  . Skin cancer excision      melonoma  . Fracture surgery Left     arm  . Submandibular gland excision Left 12/25/2013    w/sialolithotomy  . Inguinal hernia repair Bilateral   . Hernia repair      umbicial  . Knee surgery      /H&P 02/20/2010  . Submandibular gland excision Left 12/25/2013    Procedure: SUBMANDIBULAR GLAND STONE AND CHRONIC SIALOLITHIASIS EXCISION;  Surgeon: Melida Quitter, MD;  Location: [facility] OR;  Service: ENT;  Laterality: Left;    Family History  Problem Relation Age of Onset  . Hypertension Mother   . Alzheimer's disease Mother   . Heart disease Father     Social History:  reports that he has been smoking.  He does not have any smokeless tobacco history on file. He reports that he drinks about 0.6 oz of alcohol per week. He reports that he does not use illicit drugs.  Allergies:  Allergies  Allergen Reactions  . Antihistamines, Chlorpheniramine-Type Other (See Comments)    unknown  . Iodinated Diagnostic  Agents Other (See Comments)    Patient states he doesn't know what iodine is and doesn't think he is allergic to it despite it being listed with his allergies  . Lidocaine Itching    Patient is uncertain of this allergy  . Methadone Other (See Comments)    "I won't take it anymore"  . Novolog [Insulin Aspart]     "I'm just highly allergic"  . Other Hives    Steroid creams  . Pentazocine Lactate Other (See Comments)    unknown  . Sulfa Antibiotics Itching and Other (See Comments)    whelps  . Vicodin [Hydrocodone-Acetaminophen] Palpitations and Other (See Comments)    Doesn't want to take    Medications: I have reviewed the patient's current medications.  Results for orders placed or performed during the hospital encounter of 11/10/14 (from the past 48 hour(s))  Creatinine, urine, random     Status: None   Collection Time: 11/10/14 12:56 AM  Result Value Ref Range   Creatinine, Urine 104.75 mg/dL  CBC with Differential     Status: Abnormal   Collection Time: 11/10/14  8:03 PM  Result Value Ref Range   WBC 11.9 (H) 4.0 - 10.5 K/uL   RBC 3.92 (L) 4.22 -  5.81 MIL/uL   Hemoglobin 11.4 (L) 13.0 - 17.0 g/dL   HCT 33.8 (L) 39.0 - 52.0 %   MCV 86.2 78.0 - 100.0 fL   MCH 29.1 26.0 - 34.0 pg   MCHC 33.7 30.0 - 36.0 g/dL   RDW 14.8 11.5 - 15.5 %   Platelets 233 150 - 400 K/uL   Neutrophils Relative % 71 43 - 77 %   Lymphocytes Relative 20 12 - 46 %   Monocytes Relative 8 3 - 12 %   Eosinophils Relative 1 0 - 5 %   Basophils Relative 0 0 - 1 %   Neutro Abs 8.4 (H) 1.7 - 7.7 K/uL   Lymphs Abs 2.4 0.7 - 4.0 K/uL   Monocytes Absolute 1.0 0.1 - 1.0 K/uL   Eosinophils Absolute 0.1 0.0 - 0.7 K/uL   Basophils Absolute 0.0 0.0 - 0.1 K/uL   WBC Morphology ATYPICAL LYMPHOCYTES    Smear Review LARGE PLATELETS PRESENT   Basic metabolic panel     Status: Abnormal   Collection Time: 11/10/14  8:03 PM  Result Value Ref Range   Sodium 139 135 - 145 mmol/L   Potassium 4.8 3.5 - 5.1 mmol/L    Chloride 109 101 - 111 mmol/L   CO2 21 (L) 22 - 32 mmol/L   Glucose, Bld 131 (H) 65 - 99 mg/dL   BUN 29 (H) 6 - 20 mg/dL   Creatinine, Ser 2.89 (H) 0.61 - 1.24 mg/dL   Calcium 7.9 (L) 8.9 - 10.3 mg/dL   GFR calc non Af Amer 22 (L) >60 mL/min   GFR calc Af Amer 26 (L) >60 mL/min    Comment: (NOTE) The eGFR has been calculated using the CKD EPI equation. This calculation has not been validated in all clinical situations. eGFR's persistently <60 mL/min signify possible Chronic Kidney Disease.    Anion gap 9 5 - 15  I-Stat CG4 Lactic Acid, ED     Status: None   Collection Time: 11/10/14  8:10 PM  Result Value Ref Range   Lactic Acid, Venous 0.53 0.5 - 2.0 mmol/L  I-Stat Troponin, ED (not at [facility])     Status: None   Collection Time: 11/10/14 10:06 PM  Result Value Ref Range   Troponin i, poc 0.00 0.00 - 0.08 ng/mL   Comment 3            Comment: Due to the release kinetics of cTnI, a negative result within the first hours of the onset of symptoms does not rule out myocardial infarction with certainty. If myocardial infarction is still suspected, repeat the test at appropriate intervals.   I-Stat CG4 Lactic Acid, ED     Status: Abnormal   Collection Time: 11/10/14 10:07 PM  Result Value Ref Range   Lactic Acid, Venous <0.30 (L) 0.5 - 2.0 mmol/L  MRSA PCR Screening     Status: None   Collection Time: 11/10/14 11:50 PM  Result Value Ref Range   MRSA by PCR NEGATIVE NEGATIVE    Comment:        The GeneXpert MRSA Assay (FDA approved for NASAL specimens only), is one component of a comprehensive MRSA colonization surveillance program. It is not intended to diagnose MRSA infection nor to guide or monitor treatment for MRSA infections.   Protime-INR     Status: Abnormal   Collection Time: 11/11/14 12:10 AM  Result Value Ref Range   Prothrombin Time 20.1 (H) 11.6 - 15.2 seconds   INR 1.70 (H)  0.00 - 1.49  Type and screen     Status: None   Collection Time: 11/11/14 12:10 AM   Result Value Ref Range   ABO/RH(D) O NEG    Antibody Screen NEG    Sample Expiration 11/14/2014   Sedimentation rate     Status: Abnormal   Collection Time: 11/11/14 12:10 AM  Result Value Ref Range   Sed Rate 84 (H) 0 - 16 mm/hr  C-reactive protein     Status: Abnormal   Collection Time: 11/11/14 12:10 AM  Result Value Ref Range   CRP 4.5 (H) <1.0 mg/dL  Lactic acid, plasma     Status: None   Collection Time: 11/11/14 12:10 AM  Result Value Ref Range   Lactic Acid, Venous 0.6 0.5 - 2.0 mmol/L  Procalcitonin     Status: None   Collection Time: 11/11/14 12:10 AM  Result Value Ref Range   Procalcitonin <0.10 ng/mL    Comment:        Interpretation: PCT (Procalcitonin) <= 0.5 ng/mL: Systemic infection (sepsis) is not likely. Local bacterial infection is possible. (NOTE)         ICU PCT Algorithm               Non ICU PCT Algorithm    ----------------------------     ------------------------------         PCT < 0.25 ng/mL                 PCT < 0.1 ng/mL     Stopping of antibiotics            Stopping of antibiotics       strongly encouraged.               strongly encouraged.    ----------------------------     ------------------------------       PCT level decrease by               PCT < 0.25 ng/mL       >= 80% from peak PCT       OR PCT 0.25 - 0.5 ng/mL          Stopping of antibiotics                                             encouraged.     Stopping of antibiotics           encouraged.    ----------------------------     ------------------------------       PCT level decrease by              PCT >= 0.25 ng/mL       < 80% from peak PCT        AND PCT >= 0.5 ng/mL            Continuin g antibiotics                                              encouraged.       Continuing antibiotics            encouraged.    ----------------------------     ------------------------------     PCT level increase compared  PCT > 0.5 ng/mL         with peak PCT AND          PCT  >= 0.5 ng/mL             Escalation of antibiotics                                          strongly encouraged.      Escalation of antibiotics        strongly encouraged.   APTT     Status: Abnormal   Collection Time: 11/11/14 12:10 AM  Result Value Ref Range   aPTT 38 (H) 24 - 37 seconds    Comment:        IF BASELINE aPTT IS ELEVATED, SUGGEST PATIENT RISK ASSESSMENT BE USED TO DETERMINE APPROPRIATE ANTICOAGULANT THERAPY.   Glucose, capillary     Status: Abnormal   Collection Time: 11/11/14 12:33 AM  Result Value Ref Range   Glucose-Capillary 191 (H) 65 - 99 mg/dL  Brain natriuretic peptide     Status: Abnormal   Collection Time: 11/11/14  2:05 AM  Result Value Ref Range   B Natriuretic Peptide 169.7 (H) 0.0 - 100.0 pg/mL  Comprehensive metabolic panel     Status: Abnormal   Collection Time: 11/11/14  2:05 AM  Result Value Ref Range   Sodium 139 135 - 145 mmol/L   Potassium 4.2 3.5 - 5.1 mmol/L   Chloride 111 101 - 111 mmol/L   CO2 22 22 - 32 mmol/L   Glucose, Bld 170 (H) 65 - 99 mg/dL   BUN 28 (H) 6 - 20 mg/dL   Creatinine, Ser 2.86 (H) 0.61 - 1.24 mg/dL   Calcium 7.4 (L) 8.9 - 10.3 mg/dL   Total Protein 4.8 (L) 6.5 - 8.1 g/dL   Albumin 1.4 (L) 3.5 - 5.0 g/dL   AST 18 15 - 41 U/L   ALT 15 (L) 17 - 63 U/L   Alkaline Phosphatase 42 38 - 126 U/L   Total Bilirubin 0.4 0.3 - 1.2 mg/dL   GFR calc non Af Amer 23 (L) >60 mL/min   GFR calc Af Amer 26 (L) >60 mL/min    Comment: (NOTE) The eGFR has been calculated using the CKD EPI equation. This calculation has not been validated in all clinical situations. eGFR's persistently <60 mL/min signify possible Chronic Kidney Disease.    Anion gap 6 5 - 15  CBC     Status: Abnormal   Collection Time: 11/11/14  2:05 AM  Result Value Ref Range   WBC 10.5 4.0 - 10.5 K/uL   RBC 3.51 (L) 4.22 - 5.81 MIL/uL   Hemoglobin 10.2 (L) 13.0 - 17.0 g/dL   HCT 30.5 (L) 39.0 - 52.0 %   MCV 86.9 78.0 - 100.0 fL   MCH 29.1 26.0 - 34.0 pg    MCHC 33.4 30.0 - 36.0 g/dL   RDW 14.8 11.5 - 15.5 %   Platelets 215 150 - 400 K/uL  Lactic acid, plasma     Status: None   Collection Time: 11/11/14  2:05 AM  Result Value Ref Range   Lactic Acid, Venous 0.6 0.5 - 2.0 mmol/L    Dg Foot Complete Left  11/10/2014   CLINICAL DATA:  LEFT leg edema and swelling. Second toe swelling and color changes.  EXAM: LEFT FOOT - COMPLETE 3+  VIEW  COMPARISON:  None.  FINDINGS: There is diffuse soft tissue swelling around the second toe. The toe is flexed and the terminal phalanx is suboptimally visualized however there appears to be osteolysis of the terminal phalanx. The terminal phalanx is small and the appearance is compatible with osteomyelitis, particularly with gas in the adjacent soft tissues. The other toes appear within normal limits. There is no proximal tracking of gas in the foot.  IMPRESSION: Second toe osteomyelitis with adjacent soft tissue swelling and gas in the distal second toe.   Electronically Signed   By: Dereck Ligas M.D.   On: 11/10/2014 19:30    Review of Systems  All other systems reviewed and are negative.  Blood pressure 142/100, pulse 54, temperature 98.1 F (36.7 C), temperature source Oral, resp. rate 21, height $RemoveBe'6\' 9"'VAvHqEhPm$  (2.057 m), weight 147 kg (324 lb 1.2 oz), SpO2 97 %. Physical Exam On examination patient has a strong dorsalis pedis pulse he has brawny skin color changes with lymphedema and venous insufficiency of both lower extremities but no cellulitis in his legs. Patient has sausage digit swelling of the second toe with ulceration and osteomyelitis. Radiographs confirm osteomyelitis. Assessment/Plan: Assessment: Cellulitis and osteomyelitis left foot second toe.  Plan: We will plan for second [place] amputation this evening after 5 PM. Patient is nothing by mouth. He will need to be strict nonweightbearing on the left foot postoperatively.  Kimothy Kishimoto V 11/11/2014, 7:09 AM

## 2014-11-11 NOTE — Care Management Note (Signed)
Case Management Note  Patient Details  Name: Johnny Sims MRN: [id number] Date of Birth: [birth year]  Subjective/Objective:                 Pt from home admitted with osteomylitis of L foot.  Action/Plan: Return to home when medically stable. CM to f/u with d/c needs.  Expected Discharge Date:                  Expected Discharge Plan:  Home/Self Care  In-House Referral:     Discharge planning Services  CM Consult  Post Acute Care Choice:    Choice offered to:     DME Arranged:    DME Agency:     HH Arranged:    HH Agency:     Status of Service:  In process, will continue to follow  Medicare Important Message Given:    Date Medicare IM Given:    Medicare IM give by:    Date Additional Medicare IM Given:    Additional Medicare Important Message give by:     If discussed at [place] of Stay Meetings, dates discussed:    Additional Comments [name] (brother) [phone] Sharin Mons, RN 11/11/2014, 11:03 PM

## 2014-11-11 NOTE — Clinical Social Work Note (Signed)
CSW Consult Acknowledged:   CSW received a consult for transportation. The pt is enrolled in [facility]. The pt will need to call [phone] to arranged transport. CSW spoke with the pt regarding using Medicaid services. Pt explained that he will contact Medicaid Transportation.  At this time CSW will sign off.   [place], MSW, [place]

## 2014-11-11 NOTE — Interval H&P Note (Signed)
History and Physical Interval Note:  11/11/2014 5:46 PM  Johnny Sims  has presented today for surgery, with the diagnosis of OSTEO  The various methods of treatment have been discussed with the patient and family. After consideration of risks, benefits and other options for treatment, the patient has consented to  Procedure(s): FOOT, SECOND Kobe AMPUTATION (Left) as a surgical intervention .  The patient's history has been reviewed, patient examined, no change in status, stable for surgery.  I have reviewed the patient's chart and labs.  Questions were answered to the patient's satisfaction.     DUDA,MARCUS V

## 2014-11-11 NOTE — Anesthesia Preprocedure Evaluation (Addendum)
Anesthesia Evaluation  Patient identified by MRN, date of birth, ID band Patient awake    Reviewed: Allergy & Precautions, NPO status , Patient's Chart, lab work & pertinent test results  Airway Mallampati: II  TM Distance: >3 FB Neck ROM: Full    Dental   Pulmonary COPDCurrent Smoker,  + rhonchi   Pulmonary exam normal       Cardiovascular hypertension, Pt. on medications Normal cardiovascular exam    Neuro/Psych    GI/Hepatic GERD-  Controlled and Medicated,(+) Hepatitis -, C  Endo/Other  diabetes, Type 2, Insulin Dependent  Renal/GU CRFRenal disease     Musculoskeletal   Abdominal   Peds  Hematology   Anesthesia Other Findings   Reproductive/Obstetrics                            Anesthesia Physical Anesthesia Plan  ASA: III and emergent  Anesthesia Plan: Regional   Post-op Pain Management:    Induction: Intravenous  Airway Management Planned: Natural Airway  Additional Equipment:   Intra-op Plan:   Post-operative Plan:   Informed Consent: I have reviewed the patients History and Physical, chart, labs and discussed the procedure including the risks, benefits and alternatives for the proposed anesthesia with the patient or authorized representative who has indicated his/her understanding and acceptance.     Plan Discussed with: CRNA and Surgeon  Anesthesia Plan Comments:        Anesthesia Quick Evaluation

## 2014-11-11 NOTE — Progress Notes (Signed)
[facility] PROGRESS NOTE  Johnny Sims QJJ:[id number] DOB: 30-Jun-1955 DOA: 11/10/2014 PCP: Angelica Chessman, MD  Assessment/Plan: Osteomyelitis of left foot: As evidenced by X-Macalister. Patient is not obviously septic on admission. Hemodynamically stable. Orthopedic surgery was consulted by ED. - Continue with with vancomycin IV and zosyn - IVF: ns 50 cc/h and hold lasix  - Blood cultures x 2 pending.  - prn oxycodon for pain.  -lactic acid 0.6, pro-calcitonin 0.10, ESR 84, and Crp 4.5  - INR/PTT, type and screen -OR today. Cardiology consulted for clearance.   Atrial Fibrillation-RVR: CHA2DS2-VASc Score is 3 (HTN, DM and possible PVD).  Patient is on Coumadin at home. INR is 1.7 on admission, which is slightly sub therapeutic. Presents with  RVR on admission with HR up to 140-150/min, hemodynamically stable. -Cardiology consulted for pre-op clearance,  -continue with cardizem gtt and oral cardizem 10 mg tid -continue metoprolol  - hold coumadin, resume when ok by ortho.   Hyperlipidemia: LDL was 33 on 02/19/10 -Continue Zocor  Diabetes mellitus: Patient was on Lantus 28 units daily -decrease Lantus to 22 units daily  - Novolin sliding scale insulin (he is allergic to Novolog)  Hypertension: -Continue amlodipine, Toprol -Hold lisinopril -prn IV hydralazine  CKD-III: Baseline creatinine 1.5-2.5, his creatinine is 2.89 on admission, which is slightly higher than the baseline, likely due to lasix and lisnopril use -hold lasix and lisinopril -IV fluids.  -Strict I and O.   COPD: No signs of acute exacerbation. -Continue spirava  Tobacco abuse:  -Counseled about the importance of quitting smoking  -nicotine patch  GERD:  -Protonix  Code Status: *Full code.  Family Communication: Care discussed with patient.  Disposition Plan: Remain in the step down, surgery today.    Consultants:  Dr Sharol Given, Ortho  Procedures:  none  Antibiotics: Vancomycin 5-16 Zosyn  5-15    HPI/Subjective: He is complaining of multiple joints pain. He has RA, Neuropathy.  He denies chest pain and dyspnea.   Objective: Filed Vitals:   11/11/14 0330  BP: 142/100  Pulse:   Temp: 98.1 F (36.7 C)  Resp: 21    Intake/Output Summary (Last 24 hours) at 11/11/14 4818 Last data filed at 11/11/14 0600  Gross per 24 hour  Intake 1361.58 ml  Output    550 ml  Net 811.58 ml   Filed Weights   11/10/14 2320 11/11/14 0500  Weight: 144.6 kg (318 lb 12.6 oz) 147 kg (324 lb 1.2 oz)    Exam:   General:  NAD  Cardiovascular: S 1, S 2 IRR  Respiratory: Bilateral ronchus  Abdomen: BS present, soft, nt  Musculoskeletal: left foot with mild redness, second toe with edema  Data Reviewed: Basic Metabolic Panel:  Recent Labs Lab 11/05/14 0205 11/10/14 2003 11/11/14 0205  NA 140 139 139  K 3.9 4.8 4.2  CL 110 109 111  CO2 20* 21* 22  GLUCOSE 112* 131* 170*  BUN 17 29* 28*  CREATININE 2.48* 2.89* 2.86*  CALCIUM 8.0* 7.9* 7.4*   Liver Function Tests:  Recent Labs Lab 11/11/14 0205  AST 18  ALT 15*  ALKPHOS 42  BILITOT 0.4  PROT 4.8*  ALBUMIN 1.4*   No results for input(s): LIPASE, AMYLASE in the last 168 hours. No results for input(s): AMMONIA in the last 168 hours. CBC:  Recent Labs Lab 11/05/14 0205 11/10/14 2003 11/11/14 0205  WBC 8.6 11.9* 10.5  NEUTROABS  --  8.4*  --   HGB 11.6* 11.4* 10.2*  HCT  34.5* 33.8* 30.5*  MCV 85.2 86.2 86.9  PLT 216 233 215   Cardiac Enzymes: No results for input(s): CKTOTAL, CKMB, CKMBINDEX, TROPONINI in the last 168 hours. BNP (last 3 results)  Recent Labs  10/15/14 0524 11/05/14 0205 11/11/14 0205  BNP 181.5* 194.8* 169.7*    ProBNP (last 3 results) No results for input(s): PROBNP in the last 8760 hours.  CBG:  Recent Labs Lab 11/11/14 0033  GLUCAP 191*    Recent Results (from the past 240 hour(s))  MRSA PCR Screening     Status: None   Collection Time: 11/10/14 11:50 PM  Result  Value Ref Range Status   MRSA by PCR NEGATIVE NEGATIVE Final    Comment:        The GeneXpert MRSA Assay (FDA approved for NASAL specimens only), is one component of a comprehensive MRSA colonization surveillance program. It is not intended to diagnose MRSA infection nor to guide or monitor treatment for MRSA infections.      Studies: Dg Foot Complete Left  11/10/2014   CLINICAL DATA:  LEFT leg edema and swelling. Second toe swelling and color changes.  EXAM: LEFT FOOT - COMPLETE 3+ VIEW  COMPARISON:  None.  FINDINGS: There is diffuse soft tissue swelling around the second toe. The toe is flexed and the terminal phalanx is suboptimally visualized however there appears to be osteolysis of the terminal phalanx. The terminal phalanx is small and the appearance is compatible with osteomyelitis, particularly with gas in the adjacent soft tissues. The other toes appear within normal limits. There is no proximal tracking of gas in the foot.  IMPRESSION: Second toe osteomyelitis with adjacent soft tissue swelling and gas in the distal second toe.   Electronically Signed   By: Dereck Ligas M.D.   On: 11/10/2014 19:30    Scheduled Meds: . sodium chloride   Intravenous STAT  . amLODipine  5 mg Oral Daily  . cholecalciferol  2,000 Units Oral QPM  . diazepam  10 mg Oral TID  . heparin  5,000 Units Subcutaneous 3 times per day  . insulin glargine  22 Units Subcutaneous QHS  . insulin regular  0-5 Units Subcutaneous QHS  . insulin regular  0-9 Units Subcutaneous TID WC  . levothyroxine  88 mcg Oral QAC breakfast  . metoprolol  50 mg Oral Daily  . multivitamin with minerals  1 tablet Oral q morning - 10a  . nicotine  21 mg Transdermal Q24H  . oxyCODONE  30 mg Oral 5 X Daily  . pantoprazole  40 mg Oral Daily  . piperacillin-tazobactam (ZOSYN)  IV  3.375 g Intravenous 3 times per day  . simvastatin  20 mg Oral Daily  . sodium chloride  3 mL Intravenous Q12H  . tiotropium  18 mcg Inhalation  Daily  . vancomycin  1,500 mg Intravenous QHS  . Warfarin - Pharmacist Dosing Inpatient   Does not apply q1800   Continuous Infusions: . diltiazem (CARDIZEM) infusion 5 mg/hr (11/11/14 0600)    Principal Problem:   Osteomyelitis of left foot Active Problems:   Atrial fibrillation with RVR   Diabetes mellitus   Hyperlipidemia   Essential hypertension   Bipolar disease, chronic   Hepatitis C   COPD (chronic obstructive pulmonary disease)   Long term current use of anticoagulant therapy   Tobacco use disorder   Osteomyelitis of foot, acute   Acute renal failure superimposed on stage 3 chronic kidney disease   GERD (gastroesophageal reflux disease)  Time spent: 35 minutes.     Niel Hummer A  [facility] Pager [phone]. If 7PM-7AM, please contact night-coverage at [email or web address], password [facility] 11/11/2014, 7:22 AM  LOS: 1 day

## 2014-11-11 NOTE — Progress Notes (Signed)
OT Cancellation Note  Patient Details Name: Johnny Sims MRN: [id number] DOB: [birth year]   Cancelled Treatment:    Reason Eval/Treat Not Completed: Other (comment) Pt scheduled for surgery this pm. Will wait and eval pt tomorrow to assess D/C needs. Per ortho, pt will be NWB . [place], OTR/L  [phone] 11/11/2014 11/11/2014, 2:23 PM

## 2014-11-11 NOTE — Evaluation (Signed)
Physical Therapy Evaluation Patient Details Name: Johnny Sims MRN: [id number] DOB: 1956/05/01 Today's Date: 11/11/2014   History of Present Illness  Pt is a 59 y/o male admitted with osteomyelities of 2nd toe on the L foot.  Pt for amputation 5/17.  Clinical Impression  Pt admitted with/for osteo L 2nd toe.  For amp. 5/17.  Pt currently limited functionally due to the problems listed below.  (see problems list.)  Pt will benefit from PT to maximize function and safety to be able to get home safely with limited available assist.     Follow Up Recommendations Home health PT;Other (comment) (if pt able to bear weight on his heel o/w may need SNF)    Equipment Recommendations  Rolling walker with 5" wheels;3in1 (PT)    Recommendations for Other Services       Precautions / Restrictions Precautions Precautions: Fall      Mobility  Bed Mobility Overal bed mobility: Independent                Transfers Overall transfer level: Needs assistance (Modified independent at full weight bearing) Equipment used: Rolling walker (2 wheeled) Transfers: Sit to/from Stand Sit to Stand: Min guard         General transfer comment: Practiced sit to stand with NWB on the Left.  Pt was capable, but more unsteady.  Ambulation/Gait Ambulation/Gait assistance: Min guard Ambulation Distance (Feet): 25 Feet Assistive device: Rolling walker (2 wheeled)       General Gait Details: pt able to manage "swing to" gait pattern, but is more unsteady and rushes himself too much and doesn't focus well.  Stairs            Wheelchair Mobility    Modified Rankin (Stroke Patients Only)       Balance Overall balance assessment: Needs assistance Sitting-balance support: No upper extremity supported Sitting balance-Leahy Scale: Good     Standing balance support: No upper extremity supported Standing balance-Leahy Scale: Fair Standing balance comment: fair with full WB, needs RW with NWB  left                             Pertinent Vitals/Pain Pain Assessment: No/denies pain    Home Living Family/patient expects to be discharged to:: Private residence Living Arrangements: Alone Available Help at Discharge: Available PRN/intermittently;Other (Comment) (very little available assist, brother next door is in poor h) Type of Home: Apartment Home Access: Stairs to enter Entrance Stairs-Rails: Right;Left Entrance Stairs-Number of Steps: 5-6 minimum Home Layout: One level Home Equipment: Cane - single point      Prior Function Level of Independence: Independent with assistive device(s)               Hand Dominance        Extremity/Trunk Assessment   Upper Extremity Assessment: Overall WFL for tasks assessed           Lower Extremity Assessment: Overall WFL for tasks assessed      Cervical / Trunk Assessment: Normal  Communication   Communication: No difficulties  Cognition Arousal/Alertness: Awake/alert Behavior During Therapy:  (manic) Overall Cognitive Status: Within Functional Limits for tasks assessed                      General Comments General comments (skin integrity, edema, etc.): Practiced mobilizing with L LE NWB.  Pt reported joints hurting having to lift his weight.    Exercises  Assessment/Plan    PT Assessment Patient needs continued PT services  PT Diagnosis Difficulty walking   PT Problem List Decreased activity tolerance;Decreased balance;Decreased mobility;Decreased knowledge of use of DME  PT Treatment Interventions     PT Goals (Current goals can be found in the Care Plan section) Acute Rehab PT Goals Patient Stated Goal: able to go home PT Goal Formulation: With patient Time For Goal Achievement: 11/18/14 Potential to Achieve Goals: Good    Frequency Min 3X/week   Barriers to discharge Decreased caregiver support      Co-evaluation               End of Session   Activity  Tolerance: Patient tolerated treatment well;Patient limited by lethargy Patient left: in bed;Other (comment);with call bell/phone within reach (sitting EOB) Nurse Communication: Mobility status         Time: 5868-2574 PT Time Calculation (min) (ACUTE ONLY): 28 min   Charges:   PT Evaluation $Initial PT Evaluation Tier I: 1 Procedure PT Treatments $Gait Training: 8-22 mins   PT G Codes:        Arnold Depinto, Tessie Fass 11/11/2014, 11:29 AM 11/11/2014  Donnella Sham, PT [phone]  (pager)

## 2014-11-11 NOTE — Op Note (Signed)
11/10/2014 - 11/11/2014  8:57 PM  PATIENT:  Johnny Sims    PRE-OPERATIVE DIAGNOSIS:  OSTEOMYELITIS left foot second toe  POST-OPERATIVE DIAGNOSIS:  Same  PROCEDURE: Left foot FOOT, SECOND Kennie AMPUTATION  SURGEON:  Jun Rightmyer V, MD  PHYSICIAN ASSISTANT:None ANESTHESIA:   General  PREOPERATIVE INDICATIONS:  Johnny Sims is a  59 y.o. male with a diagnosis of OSTEOMYELITIS who failed conservative measures and elected for surgical management.    The risks benefits and alternatives were discussed with the patient preoperatively including but not limited to the risks of infection, bleeding, nerve injury, cardiopulmonary complications, the need for revision surgery, among others, and the patient was willing to proceed.  OPERATIVE IMPLANTS: None  OPERATIVE FINDINGS: No deep abscess  OPERATIVE PROCEDURE: Patient was brought to the operating room after undergoing a popliteal block. After adequate levels of anesthesia were obtained patient's left lower extremity was prepped using DuraPrep draped into a sterile field. A timeout was called. A racquet incision was made around the second toe and the second Bence was resected through the midshaft of the second metatarsal. Hemostasis was obtained. The wound was irrigated with normal saline. The incision was closed using 2-0 nylon. A sterile compressive dressing was applied. Patient was taken to the PACU in stable condition.

## 2014-11-11 NOTE — Progress Notes (Signed)
ANTICOAGULATION CONSULT NOTE - Follow-up Consult  Pharmacy Consult for Coumadin Indication: atrial fibrillation  Allergies  Allergen Reactions  . Antihistamines, Chlorpheniramine-Type Other (See Comments)    unknown  . Iodinated Diagnostic Agents Other (See Comments)    Patient states he doesn't know what iodine is and doesn't think he is allergic to it despite it being listed with his allergies  . Lidocaine Itching    Patient is uncertain of this allergy  . Methadone Other (See Comments)    "I won't take it anymore"  . Novolog [Insulin Aspart]     "I'm just highly allergic"  . Other Hives    Steroid creams  . Pentazocine Lactate Other (See Comments)    unknown  . Sulfa Antibiotics Itching and Other (See Comments)    whelps  . Vicodin [Hydrocodone-Acetaminophen] Palpitations and Other (See Comments)    Doesn't want to take    Patient Measurements: Height: 6\' 9"  (205.7 cm) Weight: (!) 324 lb 1.2 oz (147 kg) IBW/kg (Calculated) : 98.3 Vital Signs: Temp: 98.2 F (36.8 C) (05/17 0749) Temp Source: Oral (05/17 0749) BP: 142/100 mmHg (05/17 0330)  Labs:  Recent Labs  11/10/14 2003 11/11/14 0010 11/11/14 0205  HGB 11.4*  --  10.2*  HCT 33.8*  --  30.5*  PLT 233  --  215  APTT  --  38*  --   LABPROT  --  20.1*  --   INR  --  1.70*  --   CREATININE 2.89*  --  2.86*    Estimated Creatinine Clearance: 46.9 mL/min (by C-G formula based on Cr of 2.86).   Medical History: Past Medical History  Diagnosis Date  . PAF (paroxysmal atrial fibrillation)   . Diabetes mellitus   . Hypothyroidism   . Hyperlipidemia   . Hypertension   . Chronic pain   . OA (osteoarthritis)   . COPD (chronic obstructive pulmonary disease)   . RA (rheumatoid arthritis)   . Hepatitis C   . Venous stasis   . Bipolar 1 disorder   . Hepatitis C   . Fibromyalgia   . Asthma   . Anxiety   . Melanoma     face, shoulder arm  . Pancreatitis, acute 2015  . GERD (gastroesophageal reflux  disease)   . CKD (chronic kidney disease), stage II     Medications:  Prescriptions prior to admission  Medication Sig Dispense Refill Last Dose  . amLODipine (NORVASC) 5 MG tablet Take 5 mg by mouth daily.   11/10/2014 at Unknown time  . cholecalciferol (VITAMIN D) 1000 UNITS tablet Take 2,000 Units by mouth every evening.    11/09/2014 at Unknown time  . COUMADIN 10 MG tablet Take 1 tablet (10 mg total) by mouth as directed. (Patient taking differently: Take 10 mg by mouth as directed. Monday, Wednesday, Friday and Saturday - alternating with 7.5mg ) 30 tablet 1 11/08/2014 at 6p  . COUMADIN 7.5 MG tablet Take 1 tablet (7.5 mg total) by mouth as directed. (Patient taking differently: Take 7.5 mg by mouth as directed. Sunday, Tuesday, Thursday - alternating with 10mg ) 30 tablet 1 11/06/2014 at 6p  . diazepam (VALIUM) 10 MG tablet Take 10 mg by mouth 3 (three) times daily. For anxiety   11/10/2014 at Unknown time  . fluticasone (FLONASE) 50 MCG/ACT nasal spray Place 1 spray into both nostrils daily as needed for allergies.    11/09/2014 at Unknown time  . furosemide (LASIX) 80 MG tablet Take 1 tablet (80 mg total)  by mouth daily. 10 tablet 0 11/10/2014 at Unknown time  . furosemide (LASIX) 80 MG tablet Take 40 mg by mouth daily.   11/10/2014 at Unknown time  . insulin glargine (LANTUS) 100 UNIT/ML injection Inject 28 Units into the skin at bedtime.   11/09/2014 at Unknown time  . levothyroxine (SYNTHROID, LEVOTHROID) 88 MCG tablet Take 1 tablet (88 mcg total) by mouth daily. 30 tablet 3 11/10/2014 at Unknown time  . lisinopril (PRINIVIL,ZESTRIL) 40 MG tablet Take 40 mg by mouth every morning.   0 11/10/2014 at Unknown time  . metoprolol (LOPRESSOR) 50 MG tablet Take 1 tablet (50 mg total) by mouth daily. 30 tablet 5 11/10/2014 at 8a  . Multiple Vitamin (MULTIVITAMIN) tablet Take 1 tablet by mouth every morning.    11/10/2014 at Unknown time  . omeprazole (PRILOSEC) 40 MG capsule Take 1 capsule (40 mg total) by  mouth daily. 30 capsule 5 11/10/2014 at Unknown time  . oxycodone (ROXICODONE) 30 MG immediate release tablet Take 30 mg by mouth 5 (five) times daily.   11/10/2014 at Unknown time  . potassium chloride SA (K-DUR,KLOR-CON) 20 MEQ tablet Take 2 tablets (40 mEq total) by mouth daily. 20 tablet 0 11/10/2014 at Unknown time  . potassium chloride SA (K-DUR,KLOR-CON) 20 MEQ tablet Take 40 mEq by mouth daily.   11/10/2014 at Unknown time  . simvastatin (ZOCOR) 20 MG tablet Take 1 tablet (20 mg total) by mouth daily. 30 tablet 5 11/09/2014 at Unknown time  . tiotropium (SPIRIVA) 18 MCG inhalation capsule Place 1 capsule (18 mcg total) into inhaler and inhale daily. 30 capsule 0 11/10/2014 at Unknown time  . loratadine (CLARITIN) 10 MG tablet Take 1 tablet (10 mg total) by mouth daily as needed for allergies. (Patient not taking: Reported on 11/11/2014) 30 tablet 0 Completed Course at Unknown time    Assessment: 59 y.o. male admitted with L foot osteomyelitis, h/o Afib, to continue Coumadin. Patient has not taken since 5/14. INR on admission was 1.70 with a 10 mg dose given around 0300.  Goal of Therapy:  INR 2-3 Monitor platelets by anticoagulation protocol: Yes   Plan:   Coumadin 10 mg at bedtime. Daily INR  Levester Fresh, PharmD, BCPS Clinical Pharmacist 5/17/201610:50 AM

## 2014-11-11 NOTE — Anesthesia Procedure Notes (Addendum)
Anesthesia Regional Block:  Popliteal block  Pre-Anesthetic Checklist: ,, timeout performed, Correct Patient, Correct Site, Correct Laterality, Correct Procedure, Correct Position, site marked, Risks and benefits discussed,  Surgical consent,  Pre-op evaluation,  At surgeon's request and post-op pain management  Laterality: Left  Prep: chloraprep       Needles:  Injection technique: Single-shot  Needle Type: Echogenic Stimulator Needle     Needle Length: 10cm 10 cm Needle Gauge: 21 and 21 G    Additional Needles:  Procedures: ultrasound guided (picture in chart) and nerve stimulator Popliteal block  Nerve Stimulator or Paresthesia:  Response: 0.4 mA,   Additional Responses:   Narrative:  Start time: 11/11/2014 8:10 PM End time: 11/11/2014 8:25 PM Injection made incrementally with aspirations every 5 mL.  Performed by: Personally  Anesthesiologist: Lillia Abed  Additional Notes: Monitors applied. Patient sedated. Sterile prep and drape,hand hygiene and sterile gloves were used. Relevant anatomy identified.Needle position confirmed.Local anesthetic injected incrementally after negative aspiration. Local anesthetic spread visualized around nerve(s). Vascular puncture avoided. No complications. Image printed for medical record.The patient tolerated the procedure well.  Additional Saphenous nerve block performed. 15cc Local Anesthetic mixture placed under ultrasonic guidance along the medio-inferior border of the Sartorious muscle 6 inches above the knee.  No Problems encountered.  Lillia Abed MD    Procedure Name: [facility] Date/Time: 11/11/2014 8:32 PM Performed by: Zorita Pang Pre-anesthesia Checklist: Patient identified, Emergency Drugs available, Suction available, Patient being monitored and Timeout performed Patient Re-evaluated:Patient Re-evaluated prior to inductionOxygen Delivery Method: Nasal cannula

## 2014-11-11 NOTE — Progress Notes (Signed)
  Echocardiogram 2D Echocardiogram with Definity has been performed.  Diamond Nickel 11/11/2014, 4:38 PM

## 2014-11-11 NOTE — Transfer of Care (Signed)
Immediate Anesthesia Transfer of Care Note  Patient: Johnny Sims  Procedure(s) Performed: Procedure(s): FOOT, SECOND Mohannad AMPUTATION (Left)  Patient Location: PACU  Anesthesia Type:MAC combined with regional for post-op pain  Level of Consciousness: awake, alert , oriented and patient cooperative  Airway & Oxygen Therapy: Patient Spontanous Breathing and Patient connected to nasal cannula oxygen  Post-op Assessment: Report given to RN and Post -op Vital signs reviewed and stable  Post vital signs: Reviewed and stable  Last Vitals:  Filed Vitals:   11/11/14 1515  BP: 107/75  Pulse:   Temp: 36.9 C  Resp: 22    Complications: No apparent anesthesia complications

## 2014-11-12 ENCOUNTER — Inpatient Hospital Stay (HOSPITAL_COMMUNITY): Payer: Medicaid Other

## 2014-11-12 ENCOUNTER — Encounter (HOSPITAL_COMMUNITY): Payer: Self-pay | Admitting: Orthopedic Surgery

## 2014-11-12 ENCOUNTER — Encounter: Payer: Medicaid Other | Admitting: Internal Medicine

## 2014-11-12 DIAGNOSIS — I10 Essential (primary) hypertension: Secondary | ICD-10-CM

## 2014-11-12 DIAGNOSIS — I4891 Unspecified atrial fibrillation: Secondary | ICD-10-CM

## 2014-11-12 DIAGNOSIS — J449 Chronic obstructive pulmonary disease, unspecified: Secondary | ICD-10-CM

## 2014-11-12 DIAGNOSIS — I5043 Acute on chronic combined systolic (congestive) and diastolic (congestive) heart failure: Secondary | ICD-10-CM | POA: Insufficient documentation

## 2014-11-12 DIAGNOSIS — F319 Bipolar disorder, unspecified: Secondary | ICD-10-CM

## 2014-11-12 LAB — CBC
HCT: 32.3 % — ABNORMAL LOW (ref 39.0–52.0)
HEMOGLOBIN: 10.5 g/dL — AB (ref 13.0–17.0)
MCH: 28.5 pg (ref 26.0–34.0)
MCHC: 32.5 g/dL (ref 30.0–36.0)
MCV: 87.8 fL (ref 78.0–100.0)
Platelets: 234 10*3/uL (ref 150–400)
RBC: 3.68 MIL/uL — ABNORMAL LOW (ref 4.22–5.81)
RDW: 14.8 % (ref 11.5–15.5)
WBC: 9.7 10*3/uL (ref 4.0–10.5)

## 2014-11-12 LAB — BASIC METABOLIC PANEL
ANION GAP: 7 (ref 5–15)
BUN: 29 mg/dL — ABNORMAL HIGH (ref 6–20)
CO2: 24 mmol/L (ref 22–32)
CREATININE: 3.29 mg/dL — AB (ref 0.61–1.24)
Calcium: 7.8 mg/dL — ABNORMAL LOW (ref 8.9–10.3)
Chloride: 108 mmol/L (ref 101–111)
GFR calc non Af Amer: 19 mL/min — ABNORMAL LOW (ref >60)
GFR, EST AFRICAN AMERICAN: 22 mL/min — AB (ref >60)
Glucose, Bld: 104 mg/dL — ABNORMAL HIGH (ref 65–99)
POTASSIUM: 4.4 mmol/L (ref 3.5–5.1)
Sodium: 139 mmol/L (ref 135–145)

## 2014-11-12 LAB — COMPREHENSIVE METABOLIC PANEL
ALT: 17 U/L (ref 17–63)
AST: 22 U/L (ref 15–41)
Albumin: 1.7 g/dL — ABNORMAL LOW (ref 3.5–5.0)
Alkaline Phosphatase: 45 U/L (ref 38–126)
Anion gap: 8 (ref 5–15)
BUN: 30 mg/dL — AB (ref 6–20)
CO2: 23 mmol/L (ref 22–32)
CREATININE: 3.55 mg/dL — AB (ref 0.61–1.24)
Calcium: 7.9 mg/dL — ABNORMAL LOW (ref 8.9–10.3)
Chloride: 106 mmol/L (ref 101–111)
GFR, EST AFRICAN AMERICAN: 20 mL/min — AB (ref >60)
GFR, EST NON AFRICAN AMERICAN: 18 mL/min — AB (ref >60)
GLUCOSE: 79 mg/dL (ref 65–99)
Potassium: 4.9 mmol/L (ref 3.5–5.1)
Sodium: 137 mmol/L (ref 135–145)
Total Bilirubin: 0.4 mg/dL (ref 0.3–1.2)
Total Protein: 5.7 g/dL — ABNORMAL LOW (ref 6.5–8.1)

## 2014-11-12 LAB — URINALYSIS, ROUTINE W REFLEX MICROSCOPIC
BILIRUBIN URINE: NEGATIVE
Glucose, UA: NEGATIVE mg/dL
KETONES UR: NEGATIVE mg/dL
Leukocytes, UA: NEGATIVE
Nitrite: NEGATIVE
SPECIFIC GRAVITY, URINE: 1.02 (ref 1.005–1.030)
UROBILINOGEN UA: 0.2 mg/dL (ref 0.0–1.0)
pH: 6 (ref 5.0–8.0)

## 2014-11-12 LAB — PROTIME-INR
INR: 1.77 — ABNORMAL HIGH (ref 0.00–1.49)
Prothrombin Time: 20.8 seconds — ABNORMAL HIGH (ref 11.6–15.2)

## 2014-11-12 LAB — CREATININE, URINE, RANDOM: Creatinine, Urine: 100.21 mg/dL

## 2014-11-12 LAB — DIFFERENTIAL
BASOS PCT: 0 % (ref 0–1)
Basophils Absolute: 0 10*3/uL (ref 0.0–0.1)
EOS ABS: 0.3 10*3/uL (ref 0.0–0.7)
Eosinophils Relative: 3 % (ref 0–5)
Lymphocytes Relative: 26 % (ref 12–46)
Lymphs Abs: 2.5 10*3/uL (ref 0.7–4.0)
MONOS PCT: 14 % — AB (ref 3–12)
Monocytes Absolute: 1.4 10*3/uL — ABNORMAL HIGH (ref 0.1–1.0)
NEUTROS ABS: 5.5 10*3/uL (ref 1.7–7.7)
NEUTROS PCT: 57 % (ref 43–77)

## 2014-11-12 LAB — URINE MICROSCOPIC-ADD ON

## 2014-11-12 LAB — SODIUM, URINE, RANDOM: Sodium, Ur: 55 mmol/L

## 2014-11-12 LAB — URIC ACID: URIC ACID, SERUM: 6.6 mg/dL (ref 4.4–7.6)

## 2014-11-12 LAB — GLUCOSE, CAPILLARY
GLUCOSE-CAPILLARY: 79 mg/dL (ref 65–99)
GLUCOSE-CAPILLARY: 97 mg/dL (ref 65–99)
Glucose-Capillary: 101 mg/dL — ABNORMAL HIGH (ref 65–99)
Glucose-Capillary: 97 mg/dL (ref 65–99)

## 2014-11-12 LAB — HEPATITIS B SURFACE ANTIBODY,QUALITATIVE: HEP B S AB: POSITIVE — AB

## 2014-11-12 LAB — IRON AND TIBC
IRON: 10 ug/dL — AB (ref 45–182)
SATURATION RATIOS: 5 % — AB (ref 17.9–39.5)
TIBC: 189 ug/dL — ABNORMAL LOW (ref 250–450)
UIBC: 179 ug/dL

## 2014-11-12 LAB — HEPATITIS B SURFACE ANTIGEN: Hepatitis B Surface Ag: NEGATIVE

## 2014-11-12 LAB — PHOSPHORUS: Phosphorus: 5.2 mg/dL — ABNORMAL HIGH (ref 2.5–4.6)

## 2014-11-12 LAB — UREA NITROGEN, URINE: Urea Nitrogen, Ur: 519 mg/dL

## 2014-11-12 MED ORDER — LEVALBUTEROL HCL 0.63 MG/3ML IN NEBU
INHALATION_SOLUTION | RESPIRATORY_TRACT | Status: AC
  Filled 2014-11-12: qty 3

## 2014-11-12 MED ORDER — METOPROLOL TARTRATE 25 MG PO TABS
25.0000 mg | ORAL_TABLET | Freq: Two times a day (BID) | ORAL | Status: DC
  Administered 2014-11-12: 25 mg via ORAL
  Filled 2014-11-12 (×3): qty 1

## 2014-11-12 MED ORDER — FUROSEMIDE 10 MG/ML IJ SOLN
160.0000 mg | Freq: Four times a day (QID) | INTRAVENOUS | Status: DC
  Administered 2014-11-12 – 2014-11-13 (×3): 160 mg via INTRAVENOUS
  Filled 2014-11-12 (×6): qty 16

## 2014-11-12 MED ORDER — METOPROLOL TARTRATE 25 MG PO TABS
75.0000 mg | ORAL_TABLET | Freq: Every day | ORAL | Status: DC
Start: 2014-11-13 — End: 2014-11-12

## 2014-11-12 MED ORDER — DILTIAZEM HCL 30 MG PO TABS
30.0000 mg | ORAL_TABLET | Freq: Three times a day (TID) | ORAL | Status: DC
  Administered 2014-11-12 – 2014-11-13 (×3): 30 mg via ORAL
  Filled 2014-11-12 (×6): qty 1

## 2014-11-12 MED ORDER — DILTIAZEM HCL 100 MG IV SOLR
5.0000 mg/h | INTRAVENOUS | Status: DC
  Filled 2014-11-12: qty 100

## 2014-11-12 MED ORDER — LEVALBUTEROL HCL 0.63 MG/3ML IN NEBU
0.6300 mg | INHALATION_SOLUTION | Freq: Four times a day (QID) | RESPIRATORY_TRACT | Status: DC | PRN
  Administered 2014-11-12: 0.63 mg via RESPIRATORY_TRACT

## 2014-11-12 MED ORDER — OXYCODONE HCL 5 MG PO TABS
30.0000 mg | ORAL_TABLET | ORAL | Status: DC | PRN
  Administered 2014-11-12: 30 mg via ORAL
  Administered 2014-11-12: 15 mg via ORAL
  Administered 2014-11-12 – 2014-11-17 (×16): 30 mg via ORAL
  Filled 2014-11-12 (×18): qty 6

## 2014-11-12 MED ORDER — FUROSEMIDE 10 MG/ML IJ SOLN: 40.0000 mg | Freq: Two times a day (BID) | INTRAMUSCULAR | Status: DC

## 2014-11-12 MED ORDER — WARFARIN SODIUM 10 MG PO TABS
10.0000 mg | ORAL_TABLET | Freq: Once | ORAL | Status: AC
  Administered 2014-11-12: 10 mg via ORAL
  Filled 2014-11-12: qty 1

## 2014-11-12 NOTE — Progress Notes (Addendum)
[facility] PROGRESS NOTE  LAWARENCE MEEK ESL:[id number] DOB: [birth year] DOA: 11/10/2014 PCP: Angelica Chessman, MD  Assessment/Plan: Osteomyelitis of left foot: As evidenced by X-Jemuel. Patient is not obviously septic on admission. Hemodynamically stable. Orthopedic surgery was consulted by ED. - Continue with with vancomycin IV and zosyn - Blood cultures x 2 pending.  - prn home oxycodon for pain.  -lactic acid 0.6, pro-calcitonin 0.10, ESR 84, and Crp 4.5  - INR/PTT, type and screen -s/p surgery 5/17   Atrial Fibrillation-RVR: CHA2DS2-VASc Score is 3 (HTN, DM and possible PVD).  Patient is on Coumadin at home. INR is 1.7 on admission, which is slightly sub therapeutic. Presents with  RVR on admission with HR up to 140-150/min, hemodynamically stable. -Cardiology consulted for pre-op clearance,  -cardizem gtt-wean off and oral cardizem -continue metoprolol  - resume coumadin  Volume overloaded -given 1 dose if IV lasix and CR increased to 3 - up 70 lbs since last year  Hyperlipidemia: LDL was 33 on 02/19/10 -Continue Zocor  Diabetes mellitus: Patient was on Lantus 28 units daily -decrease Lantus to 22 units daily  - Novolin sliding scale insulin (he is allergic to Novolog)  Hypertension: -Continue amlodipine, Toprol -Hold lisinopril -prn IV hydralazine  CKD-III: Baseline creatinine 1.5-2.5, his creatinine is 2.89 on admission, which is slightly higher than the baseline, likely due to lasix and lisnopril use- now in the 3s -hold lisinopril -IV fluids d/c -ask renal to see  -Strict I and O.    COPD: No signs of acute exacerbation. -Continue spirava  Tobacco abuse:  -Counseled about the importance of quitting smoking  -nicotine patch  GERD:  -Protonix  Code Status: *Full code.  Family Communication: Care discussed with patient.  Disposition Plan: Remain in the step down until off cardiazem gtt- d/c once PT deems safe or patient leaves  aMA   Consultants:  Dr Sharol Given, Ortho  Procedures:  none  Antibiotics: Vancomycin 5-16 Zosyn 5-15    HPI/Subjective: C/o pain meds- says 6 $Remo'5mg'LTeqJ$  tablets are not the same as 1 30 mg tablet  Objective: Filed Vitals:   11/12/14 1119  BP:   Pulse:   Temp: 98.5 F (36.9 C)  Resp:     Intake/Output Summary (Last 24 hours) at 11/12/14 1207 Last data filed at 11/12/14 1100  Gross per 24 hour  Intake   1220 ml  Output    130 ml  Net   1090 ml   Filed Weights   11/10/14 2320 11/11/14 0500 11/12/14 0500  Weight: 144.6 kg (318 lb 12.6 oz) 147 kg (324 lb 1.2 oz) 149 kg (328 lb 7.8 oz)    Exam:   General:  NAD  Cardiovascular: irregualr  Respiratory: Bilateral ronchus  Abdomen: BS present, soft, nt  Musculoskeletal: left foot wrapped  Data Reviewed: Basic Metabolic Panel:  Recent Labs Lab 11/10/14 2003 11/11/14 0205 11/12/14 0254  NA 139 139 139  K 4.8 4.2 4.4  CL 109 111 108  CO2 21* 22 24  GLUCOSE 131* 170* 104*  BUN 29* 28* 29*  CREATININE 2.89* 2.86* 3.29*  CALCIUM 7.9* 7.4* 7.8*   Liver Function Tests:  Recent Labs Lab 11/11/14 0205  AST 18  ALT 15*  ALKPHOS 42  BILITOT 0.4  PROT 4.8*  ALBUMIN 1.4*   No results for input(s): LIPASE, AMYLASE in the last 168 hours. No results for input(s): AMMONIA in the last 168 hours. CBC:  Recent Labs Lab 11/10/14 2003 11/11/14 0205  WBC 11.9* 10.5  NEUTROABS  8.4*  --   HGB 11.4* 10.2*  HCT 33.8* 30.5*  MCV 86.2 86.9  PLT 233 215   Cardiac Enzymes: No results for input(s): CKTOTAL, CKMB, CKMBINDEX, TROPONINI in the last 168 hours. BNP (last 3 results)  Recent Labs  10/15/14 0524 11/05/14 0205 11/11/14 0205  BNP 181.5* 194.8* 169.7*    ProBNP (last 3 results) No results for input(s): PROBNP in the last 8760 hours.  CBG:  Recent Labs Lab 11/11/14 1658 11/11/14 2108 11/11/14 2134 11/12/14 0740 11/12/14 1156  GLUCAP 78 62* 78 101* 97    Recent Results (from the past 240  hour(s))  MRSA PCR Screening     Status: None   Collection Time: 11/10/14 11:50 PM  Result Value Ref Range Status   MRSA by PCR NEGATIVE NEGATIVE Final    Comment:        The GeneXpert MRSA Assay (FDA approved for NASAL specimens only), is one component of a comprehensive MRSA colonization surveillance program. It is not intended to diagnose MRSA infection nor to guide or monitor treatment for MRSA infections.   Culture, blood (routine x 2)     Status: None (Preliminary result)   Collection Time: 11/11/14 12:10 AM  Result Value Ref Range Status   Specimen Description BLOOD LEFT WRIST  Final   Special Requests BOTTLES DRAWN AEROBIC AND ANAEROBIC 10CC EA  Final   Culture   Final           BLOOD CULTURE RECEIVED NO GROWTH TO DATE CULTURE WILL BE HELD FOR 5 DAYS BEFORE ISSUING A FINAL NEGATIVE REPORT Note: Culture results may be compromised due to an excessive volume of blood received in culture bottles. Performed at [facility]    Report Status PENDING  Incomplete  Culture, blood (routine x 2)     Status: None (Preliminary result)   Collection Time: 11/11/14 12:27 AM  Result Value Ref Range Status   Specimen Description BLOOD LEFT ANTECUBITAL  Final   Special Requests BOTTLES DRAWN AEROBIC AND ANAEROBIC 10CC EA  Final   Culture   Final           BLOOD CULTURE RECEIVED NO GROWTH TO DATE CULTURE WILL BE HELD FOR 5 DAYS BEFORE ISSUING A FINAL NEGATIVE REPORT Note: Culture results may be compromised due to an excessive volume of blood received in culture bottles. Performed at [facility]    Report Status PENDING  Incomplete     Studies: Dg Foot Complete Left  11/10/2014   CLINICAL DATA:  LEFT leg edema and swelling. Second toe swelling and color changes.  EXAM: LEFT FOOT - COMPLETE 3+ VIEW  COMPARISON:  None.  FINDINGS: There is diffuse soft tissue swelling around the second toe. The toe is flexed and the terminal phalanx is suboptimally visualized however there  appears to be osteolysis of the terminal phalanx. The terminal phalanx is small and the appearance is compatible with osteomyelitis, particularly with gas in the adjacent soft tissues. The other toes appear within normal limits. There is no proximal tracking of gas in the foot.  IMPRESSION: Second toe osteomyelitis with adjacent soft tissue swelling and gas in the distal second toe.   Electronically Signed   By: Dereck Ligas M.D.   On: 11/10/2014 19:30    Scheduled Meds: . antiseptic oral rinse  7 mL Mouth Rinse q12n4p  . atorvastatin  10 mg Oral q1800  . chlorhexidine  15 mL Mouth Rinse BID  . cholecalciferol  2,000 Units Oral QPM  .  heparin  5,000 Units Subcutaneous 3 times per day  . insulin glargine  22 Units Subcutaneous QHS  . insulin regular  0-5 Units Subcutaneous QHS  . insulin regular  0-9 Units Subcutaneous TID WC  . levalbuterol      . levothyroxine  88 mcg Oral QAC breakfast  . metoprolol  50 mg Oral Daily  . multivitamin with minerals  1 tablet Oral q morning - 10a  . nicotine  21 mg Transdermal Q24H  . pantoprazole  40 mg Oral Daily  . piperacillin-tazobactam (ZOSYN)  IV  3.375 g Intravenous 3 times per day  . sodium chloride  3 mL Intravenous Q12H  . tiotropium  18 mcg Inhalation Daily  . vancomycin  1,500 mg Intravenous QHS  . warfarin  10 mg Oral ONCE-1800  . Warfarin - Pharmacist Dosing Inpatient   Does not apply q1800   Continuous Infusions: . sodium chloride 10 mL/hr at 11/11/14 2231  . diltiazem (CARDIZEM) infusion 5 mg/hr (11/12/14 1010)    Principal Problem:   Osteomyelitis of left foot Active Problems:   Atrial fibrillation with RVR   Diabetes mellitus   Hyperlipidemia   Essential hypertension   Bipolar disease, chronic   Hepatitis C   COPD (chronic obstructive pulmonary disease)   Long term current use of anticoagulant therapy   Tobacco use disorder   Osteomyelitis of foot, acute   Acute renal failure superimposed on stage 3 chronic kidney  disease   GERD (gastroesophageal reflux disease)    Time spent: 2 minutes.     Eulogio Bear  [facility] Pager [phone] 7PM-7AM, please contact night-coverage at [email or web address], password [facility] 11/12/2014, 12:07 PM  LOS: 2 days

## 2014-11-12 NOTE — Progress Notes (Signed)
Orthopedic Tech Progress Note Patient Details:  Johnny Sims 03/17/1956 [id number]  Ortho Devices Type of Ortho Device: Postop shoe/boot Ortho Device/Splint Location: lle Ortho Device/Splint Interventions: Application   [name] 11/12/2014, 9:06 AM

## 2014-11-12 NOTE — Progress Notes (Signed)
Physical Therapy Treatment Patient Details Name: DHAVAL WOO MRN: [id number] DOB: Jan 11, 1956 Today's Date: 11/12/2014    History of Present Illness Pt is a 59 y/o male admitted with osteomyelities of 2nd toe on the L foot.  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  Needed cuing to verbalize sequence for safe transfers.  Follow Up Recommendations  SNF     Equipment Recommendations  Rolling walker with 5" wheels    Recommendations for Other Services       Precautions / Restrictions Precautions Precautions: Fall    Mobility  Bed Mobility Overal bed mobility: Independent             General bed mobility comments: fast and some impulsiveness, but able to move without assist  Transfers Overall transfer level: Needs assistance Equipment used: Rolling walker (2 wheeled) Transfers: Sit to/from Stand Sit to Stand: Min assist         General transfer comment: pt "threw" himself forward to build momentum to attain stand with min to grab and support RW and steady for safety..  L LE support to keep foot off the floor.  Ambulation/Gait             General Gait Details: pt defered due to foot too painful.   Stairs            Wheelchair Mobility    Modified Rankin (Stroke Patients Only)       Balance             Standing balance-Leahy Scale: Poor Standing balance comment: able to balance with the RW                    Cognition Arousal/Alertness: Awake/alert Behavior During Therapy: Restless Overall Cognitive Status: Within Functional Limits for tasks assessed                      Exercises      General Comments General comments (skin integrity, edema, etc.): Had pt attempt to verbalize safe transfer that he was taught on eval 5/17 prior to surgery.  Pt unable to fully focus on task due to pain      Pertinent Vitals/Pain Pain Assessment:  0-10 Pain Score: 10-Worst pain ever Pain Location: L foot Pain Descriptors / Indicators: Constant Pain Intervention(s): Monitored during session;Premedicated before session;Repositioned    Home Living                      Prior Function            PT Goals (current goals can now be found in the care plan section) Acute Rehab PT Goals PT Goal Formulation: With patient Time For Goal Achievement: 11/18/14 Potential to Achieve Goals: Good Progress towards PT goals: Progressing toward goals    Frequency  Min 3X/week    PT Plan Discharge plan needs to be updated    Co-evaluation             End of Session   Activity Tolerance: Patient limited by pain Patient left: in bed;with call bell/phone within reach     Time: 1341-1404 PT Time Calculation (min) (ACUTE ONLY): 23 min  Charges:  $Therapeutic Activity: 8-22 mins  G Codes:      Isidora Laham, Tessie Fass 11/12/2014, 2:20 PM 11/12/2014  Donnella Sham, PT [phone]  (pager)

## 2014-11-12 NOTE — Progress Notes (Signed)
Patient Name: Johnny Sims Date of Encounter: 11/12/2014  Principal Problem:   Osteomyelitis of left foot Active Problems:   Atrial fibrillation with RVR   Diabetes mellitus   Hyperlipidemia   Essential hypertension   Bipolar disease, chronic   Hepatitis C   COPD (chronic obstructive pulmonary disease)   Long term current use of anticoagulant therapy   Tobacco use disorder   Osteomyelitis of foot, acute   Acute renal failure superimposed on stage 3 chronic kidney disease   GERD (gastroesophageal reflux disease)   Length of Stay: 2  SUBJECTIVE  The patient denies SOB or chest pain, no palpitations.   CURRENT MEDS . antiseptic oral rinse  7 mL Mouth Rinse q12n4p  . atorvastatin  10 mg Oral q1800  . chlorhexidine  15 mL Mouth Rinse BID  . cholecalciferol  2,000 Units Oral QPM  . heparin  5,000 Units Subcutaneous 3 times per day  . insulin glargine  22 Units Subcutaneous QHS  . insulin regular  0-5 Units Subcutaneous QHS  . insulin regular  0-9 Units Subcutaneous TID WC  . levalbuterol      . levothyroxine  88 mcg Oral QAC breakfast  . metoprolol  50 mg Oral Daily  . multivitamin with minerals  1 tablet Oral q morning - 10a  . nicotine  21 mg Transdermal Q24H  . pantoprazole  40 mg Oral Daily  . piperacillin-tazobactam (ZOSYN)  IV  3.375 g Intravenous 3 times per day  . sodium chloride  3 mL Intravenous Q12H  . tiotropium  18 mcg Inhalation Daily  . vancomycin  1,500 mg Intravenous QHS  . warfarin  10 mg Oral ONCE-1800  . Warfarin - Pharmacist Dosing Inpatient   Does not apply q1800   . sodium chloride 10 mL/hr at 11/11/14 2231  . diltiazem (CARDIZEM) infusion      OBJECTIVE  Filed Vitals:   11/12/14 0739 11/12/14 0949 11/12/14 1011 11/12/14 1119  BP: 127/77  111/86   Pulse:   118   Temp: 98.9 F (37.2 C)   98.5 F (36.9 C)  TempSrc: Oral   Oral  Resp: 21     Height:      Weight:      SpO2: 98% 97%      Intake/Output Summary (Last 24 hours) at  11/12/14 1128 Last data filed at 11/12/14 1100  Gross per 24 hour  Intake   1220 ml  Output    130 ml  Net   1090 ml   Filed Weights   11/10/14 2320 11/11/14 0500 11/12/14 0500  Weight: 318 lb 12.6 oz (144.6 kg) 324 lb 1.2 oz (147 kg) 328 lb 7.8 oz (149 kg)   PHYSICAL EXAM  General: Pleasant, NAD. Neuro: Alert and oriented X 3. Moves all extremities spontaneously. Psych: Normal affect. HEENT:  Normal  Neck: Supple without bruits or JVD. Lungs:  Resp regular and unlabored, CTA. Heart: RRR no s3, s4, or murmurs. Abdomen: Soft, non-tender, non-distended, BS + x 4.  Extremities: No clubbing, cyanosis, massive LE edema B/L. DP/PT/Radials 2+ and equal bilaterally.  Accessory Clinical Findings  CBC  Recent Labs  11/10/14 2003 11/11/14 0205  WBC 11.9* 10.5  NEUTROABS 8.4*  --   HGB 11.4* 10.2*  HCT 33.8* 30.5*  MCV 86.2 86.9  PLT 233 009   Basic Metabolic Panel  Recent Labs  11/11/14 0205 11/12/14 0254  NA 139 139  K 4.2 4.4  CL 111 108  CO2 22 24  GLUCOSE 170* 104*  BUN 28* 29*  CREATININE 2.86* 3.29*  CALCIUM 7.4* 7.8*   Liver Function Tests  Recent Labs  11/11/14 0205  AST 18  ALT 15*  ALKPHOS 42  BILITOT 0.4  PROT 4.8*  ALBUMIN 1.4*    Radiology/Studies  Dg Chest 2 View  10/15/2014   CLINICAL DATA:  Testicle pain and leg swelling  EXAM: CHEST  2 VIEW  COMPARISON:  10/13/2014  FINDINGS: Normal heart size and mediastinal contours. No acute infiltrate or edema. No effusion or pneumothorax. No acute osseous findings.  IMPRESSION: No active cardiopulmonary disease.   Electronically Signed   By: Monte Fantasia M.D.   On: 10/15/2014 05:20   Dg Foot Complete Left  11/10/2014   CLINICAL DATA:  LEFT leg edema and swelling. Second toe swelling and color changes.  EXAM: LEFT FOOT - COMPLETE 3+ VIEW  COMPARISON:  None.  FINDINGS: There is diffuse soft tissue swelling around the second toe. The toe is flexed and the terminal phalanx is suboptimally visualized  however there appears to be osteolysis of the terminal phalanx. The terminal phalanx is small and the appearance is compatible with osteomyelitis, particularly with gas in the adjacent soft tissues. The other toes appear within normal limits. There is no proximal tracking of gas in the foot.  IMPRESSION: Second toe osteomyelitis with adjacent soft tissue swelling and gas in the distal second toe.   Electronically Signed   By: Dereck Ligas M.D.   On: 11/10/2014 19:30    TELE: a-fib with RVR ventr rate 110'   Echo: 11/11/2014 Procedure narrative: Transthoracic echocardiography. Image quality was adequate. The study was technically difficult. Intravenous contrast (Definity) was administered. - Left ventricle: The cavity size was normal. Wall thickness was increased in a pattern of mild LVH. Systolic function was normal. The estimated ejection fraction was in the range of 55% to 60%. Wall motion was normal; there were no regional wall motion abnormalities. The study is not technically sufficient to allow evaluation of LV diastolic function. - Aorta: Aortic root dimension: 40 mm (ED). - Aortic root: The aortic root was top normal in size. - Mitral valve: Calcified annulus. - Left atrium: The atrium was at the upper limits of normal in size.  Impressions:  - Compared to a prior echo in 2011, there have been few changes. The aortic root measures top normal at 4.0 cm.     ASSESSMENT AND PLAN  Assessment/Plan  1 paroxysmal atrial fibrillation-the patient has developed recurrent atrial fibrillation which appears to be relatively asymptomatic. Continue Cardizem and increase metoprolol to 75 mg PO BI. CHADS vasc 3. Would continue Coumadin once all procedures complete. Can consider cardioversion in the future if atrial fibrillation persists once Coumadin is therapeutic and all procedures complete.  2 acute on chronic diastolic congestive heart failure-the patient is  markedly volume overloaded on examination. Etiology of this most likely multifactorial. His renal function has declined. His albumin is 1.4. There may be a cardiac contribution from his atrial fibrillation. Will check echocardiogram for LV function. He needs diuresis with close follow-up of renal function. We would recommend to start lasix 40 mg iv BID, follow crea closely.  3 preoperative evaluation prior to amputation of second digit left lower extremity-Patient has osteomyelitis/cellulitis which makes his procedure more urgent. He has reasonable functional capacity. It is a low risk procedure but should be done under local anesthesia (discussed with Dr Sharol Given). If any more complicated vascular procedures necessary he would need preoperative evaluation  with nuclear study. He would also need to be tuned up further with diuresis and treatment of his congestive heart failure.  4 acute on chronic stage IV kidney failure-patient should most likely be seen by nephrology. Follow renal function closely with diuresis.  5 tobacco abuse-patient counseled on discontinuing.  6 hypertension-continue Cardizem and metoprolol. Follow blood pressure and adjust regimen as needed.   Signed, Dorothy Spark MD, [facility] 11/12/2014

## 2014-11-12 NOTE — Progress Notes (Signed)
Went to patients room found urethral catheter out of patient it appeared that the baloon wasn't fully inflated. Patient states he didn't pull it out, it just came out as he was turning in bed. Patient states it was very painful on insertion and wanted to wait to put it back in. Will follow up about replacing catheter.

## 2014-11-12 NOTE — Progress Notes (Signed)
ANTICOAGULATION CONSULT NOTE - Follow-up Consult  Pharmacy Consult for Coumadin Indication: atrial fibrillation  Allergies  Allergen Reactions  . Antihistamines, Chlorpheniramine-Type Other (See Comments)    unknown  . Iodinated Diagnostic Agents Other (See Comments)    Patient states he doesn't know what iodine is and doesn't think he is allergic to it despite it being listed with his allergies  . Lidocaine Itching    Patient is uncertain of this allergy  . Methadone Other (See Comments)    "I won't take it anymore"  . Novolog [Insulin Aspart]     "I'm just highly allergic"  . Other Hives    Steroid creams  . Pentazocine Lactate Other (See Comments)    unknown  . Sulfa Antibiotics Itching and Other (See Comments)    whelps  . Vicodin [Hydrocodone-Acetaminophen] Palpitations and Other (See Comments)    Doesn't want to take    Patient Measurements: Height: 6\' 9"  (205.7 cm) Weight: (!) 328 lb 7.8 oz (149 kg) IBW/kg (Calculated) : 98.3 Vital Signs: Temp: 97.9 F (36.6 C) (05/18 0447) Temp Source: Oral (05/18 0447) BP: 111/86 mmHg (05/18 1011) Pulse Rate: 118 (05/18 1011)  Labs:  Recent Labs  11/10/14 2003 11/11/14 0010 11/11/14 0205 11/12/14 0254  HGB 11.4*  --  10.2*  --   HCT 33.8*  --  30.5*  --   PLT 233  --  215  --   APTT  --  38*  --   --   LABPROT  --  20.1*  --  20.8*  INR  --  1.70*  --  1.77*  CREATININE 2.89*  --  2.86* 3.29*    Estimated Creatinine Clearance: 41.1 mL/min (by C-G formula based on Cr of 3.29).  Assessment: 59 y.o. male admitted with L foot osteomyelitis. He continues on coumadin for history of afib. INR remains slightly low. No new CBC today, no bleeding noted. He is also on SQ heparin.   Goal of Therapy:  INR 2-3   Plan:   - Warfarin 10mg  PO x 1 tonight - F/u AM INR - DC heparin SQ when INR is therapeutic  Salome Arnt, PharmD, BCPS Pager # [phone] 11/12/2014 10:32 AM

## 2014-11-12 NOTE — Progress Notes (Signed)
Patient ID: Johnny Sims, male   DOB: 1956-01-17, 59 y.o.   MRN: [id number] Postoperative day 1 left foot second Famous amputation. The dressing is clean and dry. Patient is laying in bed with his feet dependent on the floor. Discussed the importance of elevation. Physical therapy progressive ambulation nonweightbearing on the left lower extremity. Patient is safe for discharge once he is safe with physical therapy. I will follow-up in the office in 2 weeks.

## 2014-11-12 NOTE — Consult Note (Signed)
Reason for Consult:CKD4, AKI Referring Physician: Dr. Pasty Arch Sims is an 59 y.o. male.  HPI: 59 yr old man with hx DM and HTN for 22 yr, admitted now for osteo of L 2nd toe.  Had amp of 2n Darivs on L foot..  Cr in past 3 mon 2.28-3.02.  Cr 5.17 2.86 and 3.29 today.  No neg fluid balance despite low dose Furosemide.  Was on lisinopril on admit and has had Afib this admit on Cardiazem.  Notes severe ^ swelling past few mon, gain over 40 lb.  Urine has shown >332m %  Protein in all checked this yr..   Many other prob including Bipolar illess (on Li but not recent), Hep C (to see Hepatology), COPD, still smoking with about 45pk yr, Arthritis OA and ???RA, stasis derm and cellulitis of LE, GERD, past resx of Melanoma, resx of neck mass and oral masses, fibromyalgia, ^ lipids, hypothyroid with partial resx.  Cr 1.09 12/25/13, 1.59 on 1.27/16.          Denies voiding sx, or FH of renal dz, hearing , eye or ms skel defects.   Thinks he got Hep C from transfusions but admits other risk factors. Constitutional: swelling, not feeling good for some time Eyes: glasses, and knows of eye issues Ears, nose, mouth, throat, and face: poor dentition Respiratory: see above, chronic cough Cardiovascular: as above. Afib, toes off, sleeps in recliner. Gastrointestinal: indigestion.   Genitourinary:as above. Noct x 2-3 Integument/breast: itching, rash on back Musculoskeletal:arthritis in hand, knees but cannot tell which joints Neurological: neuropathy Behavioral/Psych: see above, severe anxiety Endocrine: DM, hypothyroid Allergic/Immunologic: ?antihist, Vicodin, methadone,Talwin,   Sulfa, contast, Novolog   Past Medical History  Diagnosis Date  . PAF (paroxysmal atrial fibrillation)   . Diabetes mellitus   . Hypothyroidism   . Hyperlipidemia   . Hypertension   . Chronic pain   . OA (osteoarthritis)   . COPD (chronic obstructive pulmonary disease)   . RA (rheumatoid arthritis)   . Hepatitis C   .  Venous stasis   . Bipolar 1 disorder   . Hepatitis C   . Fibromyalgia   . Asthma   . Anxiety   . Melanoma     face, shoulder arm  . Pancreatitis, acute 2015  . GERD (gastroesophageal reflux disease)   . CKD (chronic kidney disease), stage II     Past Surgical History  Procedure Laterality Date  . Thyroidectomy    . Tonsillectomy    . UKoreaechocardiography  02/19/2010    EF 55-60%  . Skin cancer excision      melonoma  . Fracture surgery Left     arm  . Submandibular gland excision Left 12/25/2013    w/sialolithotomy  . Inguinal hernia repair Bilateral   . Hernia repair      umbicial  . Knee surgery      /H&P 02/20/2010  . Submandibular gland excision Left 12/25/2013    Procedure: SUBMANDIBULAR GLAND STONE AND CHRONIC SIALOLITHIASIS EXCISION;  Surgeon: DMelida Quitter MD;  [facility];  Service: ENT;  Laterality: Left;    Family History  Problem Relation Age of Onset  . Hypertension Mother   . Alzheimer's disease Mother   . Heart disease Father     Social History:  reports that he has been smoking.  He does not have any smokeless tobacco history on file. He reports that he drinks about 0.6 oz of alcohol per week. He reports that  he does not use illicit drugs.  Allergies:  Allergies  Allergen Reactions  . Antihistamines, Chlorpheniramine-Type Other (See Comments)    unknown  . Iodinated Diagnostic Agents Other (See Comments)    Patient states he doesn't know what iodine is and doesn't think he is allergic to it despite it being listed with his allergies  . Lidocaine Itching    Patient is uncertain of this allergy  . Methadone Other (See Comments)    "I won't take it anymore"  . Novolog [Insulin Aspart]     "I'm just highly allergic"  . Other Hives    Steroid creams  . Pentazocine Lactate Other (See Comments)    unknown  . Sulfa Antibiotics Itching and Other (See Comments)    whelps  . Vicodin [Hydrocodone-Acetaminophen] Palpitations and Other (See Comments)     Doesn't want to take    Medications:  I have reviewed the patient's current medications. Prior to Admission:  Prescriptions prior to admission  Medication Sig Dispense Refill Last Dose  . amLODipine (NORVASC) 5 MG tablet Take 5 mg by mouth daily.   11/10/2014 at Unknown time  . cholecalciferol (VITAMIN D) 1000 UNITS tablet Take 2,000 Units by mouth every evening.    11/09/2014 at Unknown time  . COUMADIN 10 MG tablet Take 1 tablet (10 mg total) by mouth as directed. (Patient taking differently: Take 10 mg by mouth as directed. Monday, Wednesday, Friday and Saturday - alternating with 7.17m) 30 tablet 1 11/08/2014 at 6p  . COUMADIN 7.5 MG tablet Take 1 tablet (7.5 mg total) by mouth as directed. (Patient taking differently: Take 7.5 mg by mouth as directed. Sunday, Tuesday, Thursday - alternating with 164m 30 tablet 1 11/06/2014 at 6p  . diazepam (VALIUM) 10 MG tablet Take 10 mg by mouth 3 (three) times daily. For anxiety   11/10/2014 at Unknown time  . fluticasone (FLONASE) 50 MCG/ACT nasal spray Place 1 spray into both nostrils daily as needed for allergies.    11/09/2014 at Unknown time  . furosemide (LASIX) 80 MG tablet Take 1 tablet (80 mg total) by mouth daily. 10 tablet 0 Past Week at Unknown time  . furosemide (LASIX) 80 MG tablet Take 40 mg by mouth daily.   11/10/2014 at Unknown time  . insulin glargine (LANTUS) 100 UNIT/ML injection Inject 28 Units into the skin at bedtime.   11/09/2014 at Unknown time  . levothyroxine (SYNTHROID, LEVOTHROID) 88 MCG tablet Take 1 tablet (88 mcg total) by mouth daily. 30 tablet 3 11/10/2014 at Unknown time  . lisinopril (PRINIVIL,ZESTRIL) 40 MG tablet Take 40 mg by mouth every morning.   0 11/10/2014 at Unknown time  . metoprolol (LOPRESSOR) 50 MG tablet Take 1 tablet (50 mg total) by mouth daily. 30 tablet 5 11/10/2014 at 8a  . Multiple Vitamin (MULTIVITAMIN) tablet Take 1 tablet by mouth every morning.    11/10/2014 at Unknown time  . omeprazole (PRILOSEC) 40 MG  capsule Take 1 capsule (40 mg total) by mouth daily. 30 capsule 5 11/10/2014 at Unknown time  . oxycodone (ROXICODONE) 30 MG immediate release tablet Take 30 mg by mouth 5 (five) times daily.   11/10/2014 at Unknown time  . potassium chloride SA (K-DUR,KLOR-CON) 20 MEQ tablet Take 2 tablets (40 mEq total) by mouth daily. 20 tablet 0 11/10/2014 at Unknown time  . potassium chloride SA (K-DUR,KLOR-CON) 20 MEQ tablet Take 40 mEq by mouth daily.   11/10/2014 at Unknown time  . simvastatin (ZOCOR) 20 MG tablet Take  1 tablet (20 mg total) by mouth daily. 30 tablet 5 11/09/2014 at Unknown time  . tiotropium (SPIRIVA) 18 MCG inhalation capsule Place 1 capsule (18 mcg total) into inhaler and inhale daily. 30 capsule 0 11/10/2014 at Unknown time  . loratadine (CLARITIN) 10 MG tablet Take 1 tablet (10 mg total) by mouth daily as needed for allergies. (Patient not taking: Reported on 11/11/2014) 30 tablet 0 Completed Course at Unknown time     Results for orders placed or performed during the hospital encounter of 11/10/14 (from the past 48 hour(s))  CBC with Differential     Status: Abnormal   Collection Time: 11/10/14  8:03 PM  Result Value Ref Range   WBC 11.9 (H) 4.0 - 10.5 K/uL   RBC 3.92 (L) 4.22 - 5.81 MIL/uL   Hemoglobin 11.4 (L) 13.0 - 17.0 g/dL   HCT 33.8 (L) 39.0 - 52.0 %   MCV 86.2 78.0 - 100.0 fL   MCH 29.1 26.0 - 34.0 pg   MCHC 33.7 30.0 - 36.0 g/dL   RDW 14.8 11.5 - 15.5 %   Platelets 233 150 - 400 K/uL   Neutrophils Relative % 71 43 - 77 %   Lymphocytes Relative 20 12 - 46 %   Monocytes Relative 8 3 - 12 %   Eosinophils Relative 1 0 - 5 %   Basophils Relative 0 0 - 1 %   Neutro Abs 8.4 (H) 1.7 - 7.7 K/uL   Lymphs Abs 2.4 0.7 - 4.0 K/uL   Monocytes Absolute 1.0 0.1 - 1.0 K/uL   Eosinophils Absolute 0.1 0.0 - 0.7 K/uL   Basophils Absolute 0.0 0.0 - 0.1 K/uL   WBC Morphology ATYPICAL LYMPHOCYTES    Smear Review LARGE PLATELETS PRESENT   Basic metabolic panel     Status: Abnormal    Collection Time: 11/10/14  8:03 PM  Result Value Ref Range   Sodium 139 135 - 145 mmol/L   Potassium 4.8 3.5 - 5.1 mmol/L   Chloride 109 101 - 111 mmol/L   CO2 21 (L) 22 - 32 mmol/L   Glucose, Bld 131 (H) 65 - 99 mg/dL   BUN 29 (H) 6 - 20 mg/dL   Creatinine, Ser 2.89 (H) 0.61 - 1.24 mg/dL   Calcium 7.9 (L) 8.9 - 10.3 mg/dL   GFR calc non Af Amer 22 (L) >60 mL/min   GFR calc Af Amer 26 (L) >60 mL/min    Comment: (NOTE) The eGFR has been calculated using the CKD EPI equation. This calculation has not been validated in all clinical situations. eGFR's persistently <60 mL/min signify possible Chronic Kidney Disease.    Anion gap 9 5 - 15  I-Stat CG4 Lactic Acid, ED     Status: None   Collection Time: 11/10/14  8:10 PM  Result Value Ref Range   Lactic Acid, Venous 0.53 0.5 - 2.0 mmol/L  I-Stat Troponin, ED (not at [facility])     Status: None   Collection Time: 11/10/14 10:06 PM  Result Value Ref Range   Troponin i, poc 0.00 0.00 - 0.08 ng/mL   Comment 3            Comment: Due to the release kinetics of cTnI, a negative result within the first hours of the onset of symptoms does not rule out myocardial infarction with certainty. If myocardial infarction is still suspected, repeat the test at appropriate intervals.   I-Stat CG4 Lactic Acid, ED     Status: Abnormal  Collection Time: 11/10/14 10:07 PM  Result Value Ref Range   Lactic Acid, Venous <0.30 (L) 0.5 - 2.0 mmol/L  MRSA PCR Screening     Status: None   Collection Time: 11/10/14 11:50 PM  Result Value Ref Range   MRSA by PCR NEGATIVE NEGATIVE    Comment:        The GeneXpert MRSA Assay (FDA approved for NASAL specimens only), is one component of a comprehensive MRSA colonization surveillance program. It is not intended to diagnose MRSA infection nor to guide or monitor treatment for MRSA infections.   Protime-INR     Status: Abnormal   Collection Time: 11/11/14 12:10 AM  Result Value Ref Range   Prothrombin Time  20.1 (H) 11.6 - 15.2 seconds   INR 1.70 (H) 0.00 - 1.49  Type and screen     Status: None   Collection Time: 11/11/14 12:10 AM  Result Value Ref Range   ABO/RH(D) O NEG    Antibody Screen NEG    Sample Expiration 11/14/2014   Culture, blood (routine x 2)     Status: None (Preliminary result)   Collection Time: 11/11/14 12:10 AM  Result Value Ref Range   Specimen Description BLOOD LEFT WRIST    Special Requests BOTTLES DRAWN AEROBIC AND ANAEROBIC 10CC EA    Culture             BLOOD CULTURE RECEIVED NO GROWTH TO DATE CULTURE WILL BE HELD FOR 5 DAYS BEFORE ISSUING A FINAL NEGATIVE REPORT Note: Culture results may be compromised due to an excessive volume of blood received in culture bottles. Performed at [facility]    Report Status PENDING   Sedimentation rate     Status: Abnormal   Collection Time: 11/11/14 12:10 AM  Result Value Ref Range   Sed Rate 84 (H) 0 - 16 mm/hr  C-reactive protein     Status: Abnormal   Collection Time: 11/11/14 12:10 AM  Result Value Ref Range   CRP 4.5 (H) <1.0 mg/dL  Lactic acid, plasma     Status: None   Collection Time: 11/11/14 12:10 AM  Result Value Ref Range   Lactic Acid, Venous 0.6 0.5 - 2.0 mmol/L  Procalcitonin     Status: None   Collection Time: 11/11/14 12:10 AM  Result Value Ref Range   Procalcitonin <0.10 ng/mL    Comment:        Interpretation: PCT (Procalcitonin) <= 0.5 ng/mL: Systemic infection (sepsis) is not likely. Local bacterial infection is possible. (NOTE)         ICU PCT Algorithm               Non ICU PCT Algorithm    ----------------------------     ------------------------------         PCT < 0.25 ng/mL                 PCT < 0.1 ng/mL     Stopping of antibiotics            Stopping of antibiotics       strongly encouraged.               strongly encouraged.    ----------------------------     ------------------------------       PCT level decrease by               PCT < 0.25 ng/mL       >= 80% from peak  PCT  OR PCT 0.25 - 0.5 ng/mL          Stopping of antibiotics                                             encouraged.     Stopping of antibiotics           encouraged.    ----------------------------     ------------------------------       PCT level decrease by              PCT >= 0.25 ng/mL       < 80% from peak PCT        AND PCT >= 0.5 ng/mL            Continuin g antibiotics                                              encouraged.       Continuing antibiotics            encouraged.    ----------------------------     ------------------------------     PCT level increase compared          PCT > 0.5 ng/mL         with peak PCT AND          PCT >= 0.5 ng/mL             Escalation of antibiotics                                          strongly encouraged.      Escalation of antibiotics        strongly encouraged.   APTT     Status: Abnormal   Collection Time: 11/11/14 12:10 AM  Result Value Ref Range   aPTT 38 (H) 24 - 37 seconds    Comment:        IF BASELINE aPTT IS ELEVATED, SUGGEST PATIENT RISK ASSESSMENT BE USED TO DETERMINE APPROPRIATE ANTICOAGULANT THERAPY.   Culture, blood (routine x 2)     Status: None (Preliminary result)   Collection Time: 11/11/14 12:27 AM  Result Value Ref Range   Specimen Description BLOOD LEFT ANTECUBITAL    Special Requests BOTTLES DRAWN AEROBIC AND ANAEROBIC 10CC EA    Culture             BLOOD CULTURE RECEIVED NO GROWTH TO DATE CULTURE WILL BE HELD FOR 5 DAYS BEFORE ISSUING A FINAL NEGATIVE REPORT Note: Culture results may be compromised due to an excessive volume of blood received in culture bottles. Performed at [facility]    Report Status PENDING   Glucose, capillary     Status: Abnormal   Collection Time: 11/11/14 12:33 AM  Result Value Ref Range   Glucose-Capillary 191 (H) 65 - 99 mg/dL  Brain natriuretic peptide     Status: Abnormal   Collection Time: 11/11/14  2:05 AM  Result Value Ref Range   B Natriuretic  Peptide 169.7 (H) 0.0 - 100.0 pg/mL  Comprehensive metabolic panel     Status: Abnormal   Collection Time: 11/11/14  2:05  AM  Result Value Ref Range   Sodium 139 135 - 145 mmol/L   Potassium 4.2 3.5 - 5.1 mmol/L   Chloride 111 101 - 111 mmol/L   CO2 22 22 - 32 mmol/L   Glucose, Bld 170 (H) 65 - 99 mg/dL   BUN 28 (H) 6 - 20 mg/dL   Creatinine, Ser 2.86 (H) 0.61 - 1.24 mg/dL   Calcium 7.4 (L) 8.9 - 10.3 mg/dL   Total Protein 4.8 (L) 6.5 - 8.1 g/dL   Albumin 1.4 (L) 3.5 - 5.0 g/dL   AST 18 15 - 41 U/L   ALT 15 (L) 17 - 63 U/L   Alkaline Phosphatase 42 38 - 126 U/L   Total Bilirubin 0.4 0.3 - 1.2 mg/dL   GFR calc non Af Amer 23 (L) >60 mL/min   GFR calc Af Amer 26 (L) >60 mL/min    Comment: (NOTE) The eGFR has been calculated using the CKD EPI equation. This calculation has not been validated in all clinical situations. eGFR's persistently <60 mL/min signify possible Chronic Kidney Disease.    Anion gap 6 5 - 15  CBC     Status: Abnormal   Collection Time: 11/11/14  2:05 AM  Result Value Ref Range   WBC 10.5 4.0 - 10.5 K/uL   RBC 3.51 (L) 4.22 - 5.81 MIL/uL   Hemoglobin 10.2 (L) 13.0 - 17.0 g/dL   HCT 30.5 (L) 39.0 - 52.0 %   MCV 86.9 78.0 - 100.0 fL   MCH 29.1 26.0 - 34.0 pg   MCHC 33.4 30.0 - 36.0 g/dL   RDW 14.8 11.5 - 15.5 %   Platelets 215 150 - 400 K/uL  Lactic acid, plasma     Status: None   Collection Time: 11/11/14  2:05 AM  Result Value Ref Range   Lactic Acid, Venous 0.6 0.5 - 2.0 mmol/L  Glucose, capillary     Status: None   Collection Time: 11/11/14  7:47 AM  Result Value Ref Range   Glucose-Capillary 96 65 - 99 mg/dL   Comment 1 Document in Chart   Glucose, capillary     Status: None   Collection Time: 11/11/14 12:09 PM  Result Value Ref Range   Glucose-Capillary 66 65 - 99 mg/dL   Comment 1 Document in Chart   Glucose, capillary     Status: None   Collection Time: 11/11/14 12:39 PM  Result Value Ref Range   Glucose-Capillary 89 65 - 99 mg/dL    Comment 1 Document in Chart   Glucose, capillary     Status: None   Collection Time: 11/11/14  4:58 PM  Result Value Ref Range   Glucose-Capillary 78 65 - 99 mg/dL   Comment 1 Notify RN    Comment 2 Document in Chart   Glucose, capillary     Status: Abnormal   Collection Time: 11/11/14  9:08 PM  Result Value Ref Range   Glucose-Capillary 62 (L) 65 - 99 mg/dL  Glucose, capillary     Status: None   Collection Time: 11/11/14  9:34 PM  Result Value Ref Range   Glucose-Capillary 78 65 - 99 mg/dL  Protime-INR     Status: Abnormal   Collection Time: 11/12/14  2:54 AM  Result Value Ref Range   Prothrombin Time 20.8 (H) 11.6 - 15.2 seconds   INR 1.77 (H) 0.00 - 9.92  Basic metabolic panel     Status: Abnormal   Collection Time: 11/12/14  2:54 AM  Result Value Ref Range   Sodium 139 135 - 145 mmol/L   Potassium 4.4 3.5 - 5.1 mmol/L   Chloride 108 101 - 111 mmol/L   CO2 24 22 - 32 mmol/L   Glucose, Bld 104 (H) 65 - 99 mg/dL   BUN 29 (H) 6 - 20 mg/dL   Creatinine, Ser 3.29 (H) 0.61 - 1.24 mg/dL   Calcium 7.8 (L) 8.9 - 10.3 mg/dL   GFR calc non Af Amer 19 (L) >60 mL/min   GFR calc Af Amer 22 (L) >60 mL/min    Comment: (NOTE) The eGFR has been calculated using the CKD EPI equation. This calculation has not been validated in all clinical situations. eGFR's persistently <60 mL/min signify possible Chronic Kidney Disease.    Anion gap 7 5 - 15  Glucose, capillary     Status: Abnormal   Collection Time: 11/12/14  7:40 AM  Result Value Ref Range   Glucose-Capillary 101 (H) 65 - 99 mg/dL  Glucose, capillary     Status: None   Collection Time: 11/12/14 11:56 AM  Result Value Ref Range   Glucose-Capillary 97 65 - 99 mg/dL    Dg Foot Complete Left  11/10/2014   CLINICAL DATA:  LEFT leg edema and swelling. Second toe swelling and color changes.  EXAM: LEFT FOOT - COMPLETE 3+ VIEW  COMPARISON:  None.  FINDINGS: There is diffuse soft tissue swelling around the second toe. The toe is flexed  and the terminal phalanx is suboptimally visualized however there appears to be osteolysis of the terminal phalanx. The terminal phalanx is small and the appearance is compatible with osteomyelitis, particularly with gas in the adjacent soft tissues. The other toes appear within normal limits. There is no proximal tracking of gas in the foot.  IMPRESSION: Second toe osteomyelitis with adjacent soft tissue swelling and gas in the distal second toe.   Electronically Signed   By: Dereck Ligas M.D.   On: 11/10/2014 19:30    ROS Blood pressure 117/69, pulse 118, temperature 98.5 F (36.9 C), temperature source Oral, resp. rate 21, height _0  (2.057 m), weight 149 kg (328 lb 7.8 oz), SpO2 97 %. Physical Exam Physical Examination: General appearance - edematous, poor memory,  unkempt Mental status - slow MS, slow to orient Eyes - DM changes, scarring Mouth - few teeth, carious, Neck - adenopathy noted PCL Lymphatics - posterior cervical nodes Chest - decreased air entry noted bilat, rhonchi noted bilat, wheezing in bases Heart - S1 and S2 normal, irregularly irregular rhythm , systolic murmur Gr2  /6 at apex Abdomen - pos bs, liver down 5 cm Extremities - pedal edema 4+, bandage L foot, edema goes up presacral  Skin - trophic changes distally,  Reddish spot with papulles on mid back T7-8 area 4 cm dia  Assessment/Plan: 1 CKD 4 with mild acute worsening.  This in setting of NS, chronic ^ Cr, vol xs, anemia , acidemia.  This is most likely DM but other considerations Hep C with NS, post infectious.   Less likely Autoimmune disease.  Need to R/O obstruction.  Role of ACEI probably not a lot. 2 osteo of foot on AB, s/p amp 3 Hypertension: not an issue, 4. Anemia will eval and tx 5. Metabolic Bone Disease: eval 6 Bipolar serious issue in mgnt.  Not candidate for long term RRT at this time until controlled 7 Arthritis 8 Hep  C will need tx 9 Afib 10 Anasarca, will attempt diuresis 11 Copd  12  Smoking P U/S, UA, Uric acid, DM control, lasix , serologies , Comp, AB,  Johnny Sims L 11/12/2014, 3:08 PM

## 2014-11-12 NOTE — Anesthesia Postprocedure Evaluation (Signed)
Anesthesia Post Note  Patient: Johnny Sims  Procedure(s) Performed: Procedure(s) (LRB): FOOT, SECOND [place] AMPUTATION (Left)  Anesthesia type: regional  Patient location: PACU  Post pain: Pain level controlled  Post assessment: Patient's Cardiovascular Status Stable  Last Vitals:  Filed Vitals:   11/11/14 2314  BP:   Pulse:   Temp: 36.7 C  Resp:     Post vital signs: Reviewed and stable  Level of consciousness: awake  Complications: No apparent anesthesia complications

## 2014-11-13 DIAGNOSIS — N183 Chronic kidney disease, stage 3 (moderate): Secondary | ICD-10-CM

## 2014-11-13 DIAGNOSIS — F3162 Bipolar disorder, current episode mixed, moderate: Secondary | ICD-10-CM | POA: Diagnosis present

## 2014-11-13 LAB — PROTEIN ELECTROPHORESIS, SERUM
A/G Ratio: 0.6 — ABNORMAL LOW (ref 0.7–2.0)
Albumin ELP: 1.9 g/dL — ABNORMAL LOW (ref 3.2–5.6)
Alpha-1-Globulin: 0.4 g/dL (ref 0.1–0.4)
Alpha-2-Globulin: 1.2 g/dL (ref 0.4–1.2)
Beta Globulin: 0.9 g/dL (ref 0.6–1.3)
GAMMA GLOBULIN: 0.9 g/dL (ref 0.5–1.6)
Globulin, Total: 3.4 g/dL (ref 2.0–4.5)
Total Protein ELP: 5.3 g/dL — ABNORMAL LOW (ref 6.0–8.5)

## 2014-11-13 LAB — ANTISTREPTOLYSIN O TITER: ASO: 40.6 [IU]/mL (ref 0.0–200.0)

## 2014-11-13 LAB — MPO/PR-3 (ANCA) ANTIBODIES
ANCA Proteinase 3: <3.5 U/mL (ref 0.0–3.5)
Myeloperoxidase Abs: <9 U/mL (ref 0.0–9.0)

## 2014-11-13 LAB — GLUCOSE, CAPILLARY
GLUCOSE-CAPILLARY: 150 mg/dL — AB (ref 65–99)
Glucose-Capillary: 77 mg/dL (ref 65–99)
Glucose-Capillary: 107 mg/dL — ABNORMAL HIGH (ref 65–99)
Glucose-Capillary: 157 mg/dL — ABNORMAL HIGH (ref 65–99)

## 2014-11-13 LAB — PARATHYROID HORMONE, INTACT (NO CA): PTH: 123 pg/mL — AB (ref 15–65)

## 2014-11-13 LAB — HIV ANTIBODY (ROUTINE TESTING W REFLEX): HIV Screen 4th Generation wRfx: NONREACTIVE

## 2014-11-13 LAB — PROTIME-INR
INR: 2.61 — ABNORMAL HIGH (ref 0.00–1.49)
PROTHROMBIN TIME: 28.1 s — AB (ref 11.6–15.2)

## 2014-11-13 LAB — HEPATITIS C ANTIBODY (REFLEX): HCV Ab: REACTIVE — AB

## 2014-11-13 LAB — C3 COMPLEMENT: C3 Complement: 120 mg/dL (ref 82–167)

## 2014-11-13 LAB — C4 COMPLEMENT: COMPLEMENT C4, BODY FLUID: 17 mg/dL (ref 14–44)

## 2014-11-13 MED ORDER — WARFARIN SODIUM 7.5 MG PO TABS
7.5000 mg | ORAL_TABLET | Freq: Once | ORAL | Status: AC
  Administered 2014-11-13: 7.5 mg via ORAL
  Filled 2014-11-13: qty 1

## 2014-11-13 MED ORDER — AMIODARONE HCL IN DEXTROSE 360-4.14 MG/200ML-% IV SOLN: 30.0000 mg/h | INTRAVENOUS | Status: DC

## 2014-11-13 MED ORDER — FUROSEMIDE 80 MG PO TABS
160.0000 mg | ORAL_TABLET | Freq: Four times a day (QID) | ORAL | Status: DC
  Filled 2014-11-13 (×4): qty 2

## 2014-11-13 MED ORDER — DIVALPROEX SODIUM 500 MG PO DR TAB
500.0000 mg | DELAYED_RELEASE_TABLET | Freq: Two times a day (BID) | ORAL | Status: DC
Start: 2014-11-13 — End: 2014-11-14
  Filled 2014-11-13 (×4): qty 1

## 2014-11-13 MED ORDER — METOPROLOL TARTRATE 50 MG PO TABS
50.0000 mg | ORAL_TABLET | Freq: Two times a day (BID) | ORAL | Status: DC
  Administered 2014-11-13: 50 mg via ORAL
  Filled 2014-11-13 (×4): qty 1

## 2014-11-13 MED ORDER — SODIUM CHLORIDE 0.9 % IV SOLN
510.0000 mg | INTRAVENOUS | Status: DC
  Administered 2014-11-13: 510 mg via INTRAVENOUS
  Filled 2014-11-13: qty 17

## 2014-11-13 MED ORDER — DILTIAZEM LOAD VIA INFUSION
15.0000 mg | Freq: Once | INTRAVENOUS | Status: DC
Start: 2014-11-13 — End: 2014-11-13
  Filled 2014-11-13: qty 15

## 2014-11-13 MED ORDER — DILTIAZEM HCL 60 MG PO TABS
60.0000 mg | ORAL_TABLET | Freq: Four times a day (QID) | ORAL | Status: DC
  Administered 2014-11-13 – 2014-11-16 (×12): 60 mg via ORAL
  Filled 2014-11-13 (×16): qty 1

## 2014-11-13 MED ORDER — AMIODARONE LOAD VIA INFUSION
150.0000 mg | Freq: Once | INTRAVENOUS | Status: AC
  Administered 2014-11-13: 150 mg via INTRAVENOUS
  Filled 2014-11-13: qty 83.34

## 2014-11-13 MED ORDER — DIPHENHYDRAMINE HCL 25 MG PO CAPS
50.0000 mg | ORAL_CAPSULE | Freq: Four times a day (QID) | ORAL | Status: DC | PRN
  Administered 2014-11-14 (×2): 50 mg via ORAL
  Filled 2014-11-13 (×2): qty 2

## 2014-11-13 MED ORDER — AMIODARONE HCL IN DEXTROSE 360-4.14 MG/200ML-% IV SOLN
60.0000 mg/h | INTRAVENOUS | Status: DC
  Administered 2014-11-13: 60 mg/h via INTRAVENOUS
  Filled 2014-11-13: qty 200

## 2014-11-13 MED ORDER — FUROSEMIDE 10 MG/ML IJ SOLN
120.0000 mg | Freq: Two times a day (BID) | INTRAVENOUS | Status: DC
  Administered 2014-11-13: 120 mg via INTRAVENOUS
  Filled 2014-11-13 (×4): qty 12

## 2014-11-13 MED ORDER — METOPROLOL TARTRATE 50 MG PO TABS
50.0000 mg | ORAL_TABLET | Freq: Two times a day (BID) | ORAL | Status: DC
  Filled 2014-11-13: qty 1

## 2014-11-13 MED ORDER — QUETIAPINE FUMARATE 50 MG PO TABS
50.0000 mg | ORAL_TABLET | Freq: Every day | ORAL | Status: DC
  Filled 2014-11-13 (×2): qty 1

## 2014-11-13 MED ORDER — DILTIAZEM HCL 100 MG IV SOLR
5.0000 mg/h | INTRAVENOUS | Status: DC
  Filled 2014-11-13: qty 100

## 2014-11-13 NOTE — Progress Notes (Addendum)
Patient Name: Johnny Sims Date of Encounter: 11/13/2014  Principal Problem:   Osteomyelitis of left foot Active Problems:   Atrial fibrillation with RVR   Diabetes mellitus   Hyperlipidemia   Essential hypertension   Bipolar disease, chronic   Hepatitis C   COPD (chronic obstructive pulmonary disease)   Long term current use of anticoagulant therapy   Tobacco use disorder   Osteomyelitis of foot, acute   Acute renal failure superimposed on stage 3 chronic kidney disease   GERD (gastroesophageal reflux disease)   Acute on chronic diastolic CHF (congestive heart failure), NYHA class 3   Length of Stay: 3  SUBJECTIVE  The patient denies SOB or chest pain, no palpitations. Feels better today.  CURRENT MEDS . antiseptic oral rinse  7 mL Mouth Rinse q12n4p  . atorvastatin  10 mg Oral q1800  . chlorhexidine  15 mL Mouth Rinse BID  . cholecalciferol  2,000 Units Oral QPM  . diltiazem  30 mg Oral 3 times per day  . ferumoxytol  510 mg Intravenous Weekly  . furosemide  160 mg Oral QID  . heparin  5,000 Units Subcutaneous 3 times per day  . insulin glargine  22 Units Subcutaneous QHS  . insulin regular  0-5 Units Subcutaneous QHS  . insulin regular  0-9 Units Subcutaneous TID WC  . levothyroxine  88 mcg Oral QAC breakfast  . metoprolol  25 mg Oral BID  . multivitamin with minerals  1 tablet Oral q morning - 10a  . nicotine  21 mg Transdermal Q24H  . pantoprazole  40 mg Oral Daily  . sodium chloride  3 mL Intravenous Q12H  . tiotropium  18 mcg Inhalation Daily  . Warfarin - Pharmacist Dosing Inpatient   Does not apply q1800   . sodium chloride 10 mL/hr at 11/11/14 2231  . diltiazem (CARDIZEM) infusion Stopped (11/12/14 1454)    OBJECTIVE  Filed Vitals:   11/13/14 0440 11/13/14 0500 11/13/14 0700 11/13/14 0837  BP: 113/59  132/62   Pulse:      Temp:   98.7 F (37.1 C)   TempSrc:   Oral   Resp: 13  12   Height:      Weight:  319 lb 14.2 oz (145.1 kg)    SpO2:  98%  98% 100%    Intake/Output Summary (Last 24 hours) at 11/13/14 1041 Last data filed at 11/13/14 1027  Gross per 24 hour  Intake    560 ml  Output   4375 ml  Net  -3815 ml   Filed Weights   11/11/14 0500 11/12/14 0500 11/13/14 0500  Weight: 324 lb 1.2 oz (147 kg) 328 lb 7.8 oz (149 kg) 319 lb 14.2 oz (145.1 kg)   PHYSICAL EXAM  General: Pleasant, NAD. Neuro: Alert and oriented X 3. Moves all extremities spontaneously. Psych: Normal affect. HEENT:  Normal  Neck: Supple without bruits or JVD. Lungs:  Resp regular and unlabored, CTA. Heart: RRR no s3, s4, or murmurs. Abdomen: Soft, non-tender, non-distended, BS + x 4.  Extremities: No clubbing, cyanosis, massive LE edema B/L. DP/PT/Radials 2+ and equal bilaterally.  Accessory Clinical Findings  CBC  Recent Labs  11/10/14 2003 11/11/14 0205 11/12/14 1608  WBC 11.9* 10.5 9.7  NEUTROABS 8.4*  --  5.5  HGB 11.4* 10.2* 10.5*  HCT 33.8* 30.5* 32.3*  MCV 86.2 86.9 87.8  PLT 233 215 751   Basic Metabolic Panel  Recent Labs  11/12/14 0254 11/12/14 1608  NA 139 137  K 4.4 4.9  CL 108 106  CO2 24 23  GLUCOSE 104* 79  BUN 29* 30*  CREATININE 3.29* 3.55*  CALCIUM 7.8* 7.9*  PHOS  --  5.2*   Liver Function Tests  Recent Labs  11/11/14 0205 11/12/14 1608  AST 18 22  ALT 15* 17  ALKPHOS 42 45  BILITOT 0.4 0.4  PROT 4.8* 5.7*  ALBUMIN 1.4* 1.7*    Radiology/Studies  Dg Chest 2 View  11/12/2014   CLINICAL DATA:  Congestive heart failure  EXAM: CHEST  2 VIEW  COMPARISON:  Radiograph 10/15/2014  FINDINGS: Stable enlarged cardiac silhouette. No effusion, infiltrate, or pneumothorax. No acute osseous abnormality.  IMPRESSION: Cardiomegaly without acute cardiopulmonary findings. No change from prior.   Electronically Signed   By: Suzy Bouchard M.D.   On: 11/12/2014 16:59   TELE: a-fib with RVR ventr rate 110'   Echo: 11/11/2014 Procedure narrative: Transthoracic echocardiography. Image quality was  adequate. The study was technically difficult. Intravenous contrast (Definity) was administered. - Left ventricle: The cavity size was normal. Wall thickness was increased in a pattern of mild LVH. Systolic function was normal. The estimated ejection fraction was in the range of 55% to 60%. Wall motion was normal; there were no regional wall motion abnormalities. The study is not technically sufficient to allow evaluation of LV diastolic function. - Aorta: Aortic root dimension: 40 mm (ED). - Aortic root: The aortic root was top normal in size. - Mitral valve: Calcified annulus. - Left atrium: The atrium was at the upper limits of normal in size.  Impressions:  - Compared to a prior echo in 2011, there have been few changes. The aortic root measures top normal at 4.0 cm.     ASSESSMENT AND PLAN  Assessment/Plan  1 paroxysmal atrial fibrillation-the patient has developed recurrent atrial fibrillation which appears to be relatively asymptomatic.  CHADS vasc 3.  We will increase cardizem po to 60 mg po Q6H and continue po metoprolol 25 mg PO BID.   2 acute on chronic diastolic congestive heart failure-the patient is markedly volume overloaded on examination. Etiology of this most likely multifactorial. His renal function has declined. His albumin is 1.4. There may be a cardiac contribution from his atrial fibrillation.  His crea is worsening, I would cut down to lasix iv 120 mg BID, the majority of his fluid is in his legs, his lungs are fairly clear, we suggest to elevate his legs and possibly apply some constrictive dressing.   3 preoperative evaluation prior to amputation of second digit left lower extremity-Patient has osteomyelitis/cellulitis which makes his procedure more urgent. He has reasonable functional capacity. It is a low risk procedure but should be done under local anesthesia (discussed with Dr Sharol Given). If any more complicated vascular procedures necessary  he would need preoperative evaluation with nuclear study. He would also need to be tuned up further with diuresis and treatment of his congestive heart failure.  4 acute on chronic stage IV kidney failure-patient should most likely be seen by nephrology. Follow renal function closely with diuresis.  5 tobacco abuse-patient counseled on discontinuing.   Signed, Dorothy Spark MD, [facility] 11/13/2014

## 2014-11-13 NOTE — Clinical Social Work Note (Signed)
CSW Consult Acknowledged:   CSW received a consult for SNF placement. CSW met pt at bedside. Pt declined SNF. Pt reported that he will go home. CSW will sign off. Please reconsult if needed.        [place], MSW, [employer]

## 2014-11-13 NOTE — Progress Notes (Signed)
Occupational Therapy Evaluation:  Pt  demonstrates the below listed deficits and will benefit from continued OT to maximize safety and independence with BADLs.  He is confused with decreased understanding of NWB precautions and is impulsive.  Currently, he requires mod - total A for ADLs, and eval limited by pain.  Feel he will need SNF level rehab at discharge as he lives alone.  DME deferred to SNF.   11/12/14 1500  OT Visit Information  Last OT Received On 11/12/14  Assistance Needed +2 (safety)  History of Present Illness Pt is a 59 y/o male admitted with osteomyelities of 2nd toe on the L foot.  Pt s/p 2 nd toe amputation on the L LE.Marland Kitchen  Precautions  Precautions Fall  Home Living  Family/patient expects to be discharged to: [place] Alone  Available Help at Discharge Available PRN/intermittently;Other (Comment) (very little available assist, brother next door is in poor h)  Type of [place] to enter  Entrance Stairs-Number of Steps 5-6 minimum  Entrance Stairs-Rails Right;Left  Home Layout One level  [place] - single point  Prior Function  Level of Independence Independent with assistive device(s)  Communication  Communication No difficulties  Pain Assessment  Pain Assessment 0-10  Pain Score 10  Pain Location Lt foot   Pain Descriptors / Indicators Burning;Aching  Pain Intervention(s) Monitored during session;Repositioned  Cognition  Arousal/Alertness Awake/alert  Behavior During Therapy Restless  Overall Cognitive Status Impaired/Different from baseline  Area of Impairment Orientation;Attention;Memory;Following commands;Safety/judgement;Awareness;Problem solving  Orientation Level Disoriented to;Place;Time  Current Attention Level Focused;Sustained  Memory Decreased short-term memory  Following Commands Follows one step commands inconsistently  Safety/Judgement Decreased awareness of  safety;Decreased awareness of deficits  Problem Solving Slow processing;Difficulty sequencing;Requires verbal cues;Requires tactile cues  General Comments Unable to determined baseline as no family present, and unsure how much medications are contributing to cognitive deficits   Upper Extremity Assessment  Upper Extremity Assessment Overall WFL for tasks assessed  Lower Extremity Assessment  Lower Extremity Assessment Defer to PT evaluation  Cervical / Trunk Assessment  Cervical / Trunk Assessment Normal  ADL  Overall ADL's  Needs assistance/impaired  Eating/Feeding Independent;Bed level  Grooming Wash/dry hands;Wash/dry face;Oral care;Brushing hair;Set up;Supervision/safety;Sitting;Bed level  Upper Body Bathing Supervision/ safety;Sitting  Lower Body Bathing Maximal assistance;Sit to/from stand  Upper Body Dressing  Minimal assistance;Sitting  Lower Body Dressing Maximal assistance;Sit to/from Retail buyer Minimal assistance;+2 for safety/equipment;RW;BSC  Toilet Transfer Details (indicate cue type and reason) Requires +2 assist due to pt impulsive with decreased safety awareness.  Fatigues quickly   Toileting- Water quality scientist and Hygiene Total assistance;Sit to/from stand  Functional mobility during ADLs Minimal assistance;+2 for safety/equipment;Rolling walker  Bed Mobility  Overal bed mobility Independent  General bed mobility comments fast and some impulsiveness, but able to move without assist  Transfers  Overall transfer level Needs assistance  Equipment used Rolling walker (2 wheeled)  Transfers Sit to/from Stand  Sit to Stand Min assist;+2 safety/equipment  General transfer comment pt "threw" himself forward to build momentum to attain stand with min to grab and support RW and steady for safety..  L LE support to keep foot off the floor.  Balance  Overall balance assessment Needs assistance  Sitting-balance support Feet supported  Sitting balance-Leahy Scale  Good  Standing balance support Bilateral upper extremity supported  Standing balance-Leahy Scale Poor  General Comments  General comments (skin integrity, edema, etc.) Pt required max verbal  cues to recall info re: safe transfer technique ( taught to pt 5/17).  Pt very impulsive, falling asleep mid sentence.   OT - End of Session  Equipment Utilized During Treatment Rolling walker  Activity Tolerance Patient limited by fatigue;Patient limited by pain  Patient left in bed;with call bell/phone within reach;with bed alarm set  Nurse Communication Mobility status  OT Assessment  OT Therapy Diagnosis  Generalized weakness;Cognitive deficits;Acute pain  OT Recommendation/Assessment Patient needs continued OT Services  OT Problem List Decreased strength;Decreased activity tolerance;Impaired balance (sitting and/or standing);Decreased cognition;Decreased knowledge of use of DME or AE;Decreased safety awareness;Decreased knowledge of precautions;Pain  Barriers to Discharge Decreased caregiver support  OT Plan  OT Frequency (ACUTE ONLY) Min 2X/week  OT Treatment/Interventions (ACUTE ONLY) Self-care/ADL training;DME and/or AE instruction;Therapeutic activities;Balance training;Patient/family education  OT Recommendation  Follow Up Recommendations SNF  OT Equipment 3 in 1 bedside comode  Individuals Consulted  Consulted and Agree with Results and Recommendations   Patient  Acute Rehab OT Goals  Patient Stated Goal to regain independence   OT Goal Formulation With patient  Time For Goal Achievement 11/26/14  Potential to Achieve Goals Good  OT Time Calculation  OT Start Time (ACUTE ONLY) 1343  OT Stop Time (ACUTE ONLY) 1407  OT Time Calculation (min) 24 min  OT General Charges  $OT Visit 1 Procedure  OT Evaluation  $Initial OT Evaluation Tier I 1 Procedure   [facility], OTR/L [phone]

## 2014-11-13 NOTE — Progress Notes (Signed)
Report received from [place], Therapist, sports. Patient sitting up in bed watching TV, SR up, call bell in reach, IVF infusing without problems, site clear, sitter at bedside. Reviewed all meds with patient at this time. Patient refusing most of meds states "I dont take that stuff". Medicated per MAR. Will monitor.

## 2014-11-13 NOTE — Progress Notes (Signed)
ANTICOAGULATION CONSULT NOTE - Follow Up Consult  Pharmacy Consult for warfarin Indication: atrial fibrillation  Allergies  Allergen Reactions  . Antihistamines, Chlorpheniramine-Type Other (See Comments)    unknown  . Iodinated Diagnostic Agents Other (See Comments)    Patient states he doesn't know what iodine is and doesn't think he is allergic to it despite it being listed with his allergies  . Lidocaine Itching    Patient is uncertain of this allergy  . Methadone Other (See Comments)    "I won't take it anymore"  . Novolog [Insulin Aspart]     "I'm just highly allergic"  . Other Hives    Steroid creams  . Pentazocine Lactate Other (See Comments)    unknown  . Sulfa Antibiotics Itching and Other (See Comments)    whelps  . Vicodin [Hydrocodone-Acetaminophen] Palpitations and Other (See Comments)    Doesn't want to take    Patient Measurements: Height: 6\' 9"  (205.7 cm) Weight: (!) 319 lb 14.2 oz (145.1 kg) IBW/kg (Calculated) : 98.3   Vital Signs: Temp: 98.7 F (37.1 C) (05/19 0700) Temp Source: Oral (05/19 0700) BP: 132/62 mmHg (05/19 0700)  Labs:  Recent Labs  11/10/14 2003 11/11/14 0010 11/11/14 0205 11/12/14 0254 11/12/14 1608 11/13/14 0400  HGB 11.4*  --  10.2*  --  10.5*  --   HCT 33.8*  --  30.5*  --  32.3*  --   PLT 233  --  215  --  234  --   APTT  --  38*  --   --   --   --   LABPROT  --  20.1*  --  20.8*  --  28.1*  INR  --  1.70*  --  1.77*  --  2.61*  CREATININE 2.89*  --  2.86* 3.29* 3.55*  --     Estimated Creatinine Clearance: 37.5 mL/min (by C-G formula based on Cr of 3.55).   Medications:  Scheduled:  . amiodarone  150 mg Intravenous Once  . antiseptic oral rinse  7 mL Mouth Rinse q12n4p  . atorvastatin  10 mg Oral q1800  . chlorhexidine  15 mL Mouth Rinse BID  . cholecalciferol  2,000 Units Oral QPM  . diltiazem  30 mg Oral 3 times per day  . ferumoxytol  510 mg Intravenous Weekly  . furosemide  120 mg Intravenous BID  .  heparin  5,000 Units Subcutaneous 3 times per day  . insulin glargine  22 Units Subcutaneous QHS  . insulin regular  0-5 Units Subcutaneous QHS  . insulin regular  0-9 Units Subcutaneous TID WC  . levothyroxine  88 mcg Oral QAC breakfast  . metoprolol  50 mg Oral BID  . multivitamin with minerals  1 tablet Oral q morning - 10a  . nicotine  21 mg Transdermal Q24H  . pantoprazole  40 mg Oral Daily  . sodium chloride  3 mL Intravenous Q12H  . tiotropium  18 mcg Inhalation Daily  . Warfarin - Pharmacist Dosing Inpatient   Does not apply q1800    Assessment: 59 yo m admitted on 5/16 for left second toe pain. Patient is on warfarin PTA (7.5 mg TTSun and 10 mg MWFSat ). Pharmacy is consulted to dose warfarin for afib. INR this AM is 2.61, jumped from 1.77 after three 10 mg doses. Hgb 10.5 stable, plts 234, no issues noted. Patient is also on SQ heparin   Goal of Therapy:  INR 2-3 Monitor platelets by anticoagulation protocol: Yes  Plan:  Warfarin 7.5 mg x 1 tonight F/u INR in the AM to determine further dosing Daily INR Monitor hgb/plts, s/s of bleeding, clinical course  Nashayla Telleria L. Nicole Kindred, PharmD Clinical Pharmacy Resident Pager: [phone] 11/13/2014 11:31 AM

## 2014-11-13 NOTE — Progress Notes (Signed)
Subjective: Interval History: has complaints had terrible night, foley came out.  Objective: Vital signs in last 24 hours: Temp:  [98.3 F (36.8 C)-99.1 F (37.3 C)] 99.1 F (37.3 C) (05/19 0439) Pulse Rate:  [118] 118 (05/18 1011) Resp:  [12-29] 12 (05/19 0700) BP: (111-139)/(59-90) 132/62 mmHg (05/19 0700) SpO2:  [96 %-98 %] 98 % (05/19 0700) Weight:  [145.1 kg (319 lb 14.2 oz)] 145.1 kg (319 lb 14.2 oz) (05/19 0500) Weight change: -3.9 kg (-8 lb 9.6 oz)  Intake/Output from previous day: 05/18 0701 - 05/19 0700 In: 440 [P.O.:240; I.V.:150; IV Piggyback:50] Out: 8299 [Urine:3725] Intake/Output this shift: Total I/O In: 120 [P.O.:120] Out: -   General appearance: alert, cooperative, moderately obese and slowed mentation Resp: diminished breath sounds bilaterally, rales bibasilar and rhonchi bibasilar Cardio: irregularly irregular rhythm and S1, S2 normal GI: pos bs, liver down 7 cm Extremities: edema 4+  Lab Results:  Recent Labs  11/11/14 0205 11/12/14 1608  WBC 10.5 9.7  HGB 10.2* 10.5*  HCT 30.5* 32.3*  PLT 215 234   BMET:  Recent Labs  11/12/14 0254 11/12/14 1608  NA 139 137  K 4.4 4.9  CL 108 106  CO2 24 23  GLUCOSE 104* 79  BUN 29* 30*  CREATININE 3.29* 3.55*  CALCIUM 7.8* 7.9*   No results for input(s): PTH in the last 72 hours. Iron Studies:  Recent Labs  11/12/14 1608  IRON 10*  TIBC 189*    Studies/Results: Dg Chest 2 View  11/12/2014   CLINICAL DATA:  Congestive heart failure  EXAM: CHEST  2 VIEW  COMPARISON:  Radiograph 10/15/2014  FINDINGS: Stable enlarged cardiac silhouette. No effusion, infiltrate, or pneumothorax. No acute osseous abnormality.  IMPRESSION: Cardiomegaly without acute cardiopulmonary findings. No change from prior.   Electronically Signed   By: Suzy Bouchard M.D.   On: 11/12/2014 16:59   US Renal  11/12/2014   CLINICAL DATA:  Acute kidney injury.  EXAM: RENAL / URINARY TRACT ULTRASOUND COMPLETE  COMPARISON:  CT,  07/23/2014.  FINDINGS: Right Kidney:  Length: 13.9 cm. Increased parenchymal echogenicity. No mass or stone. No hydronephrosis.  Left Kidney:  Length: 14.5 cm. Increased parenchymal echogenicity. No mass or stone. No hydronephrosis.  Bladder:  Appears normal for degree of bladder distention.  IMPRESSION: Findings consistent with medical renal disease with increased renal parenchymal echogenicity. No other abnormality. No hydronephrosis.   Electronically Signed   By: Lajean Manes M.D.   On: 11/12/2014 17:25    I have reviewed the patient's current medications.  Assessment/Plan: 1 CKD 4  Has echodense kidneys, neg serologies, most likely DM vs Hep C nephropathy, not reversible.  Diuresing 2 Anemia needs Fe 3 Bipolar will limit tx 4 Hep C 5 Osteo in foot. 6 HTn not an issue 7 DM controlled 8  Smoking 9 Afib  Rate controlled , coumadin P lasix po, iv Fe, AB,      LOS: 3 days   Memory Heinrichs L 11/13/2014,8:25 AM

## 2014-11-13 NOTE — Progress Notes (Signed)
[facility] PROGRESS NOTE  Johnny Sims EAV:[id number] DOB: 10/16/1955 DOA: 11/10/2014 PCP: Angelica Chessman, MD  Assessment/Plan: Osteomyelitis of left foot:  - Blood cultures x 2 pending.  - prn home oxycodon for pain.  -lactic acid 0.6, pro-calcitonin 0.10, ESR 84, and Crp 4.5  - INR/PTT, type and screen -s/p surgery 5/17- abx d/c'd 5/18   Atrial Fibrillation-RVR: CHA2DS2-VASc Score is 3 (HTN, DM and possible PVD).  Patient is on Coumadin at home. INR is 1.7 on admission, which is slightly sub therapeutic. Presents with  RVR on admission with HR up to 140-150/min, hemodynamically stable. -Cardiology consulted for pre-op clearance,  -cardizem gtt-wean off and oral cardizem -continue metoprolol  - resume coumadin  Volume overloaded -given 1 dose if IV lasix and CR increased to 3 - up 70 lbs since last year  Hyperlipidemia: LDL was 33 on 02/19/10 -Continue Zocor  Diabetes mellitus: Patient was on Lantus 28 units daily -decrease Lantus to 22 units daily  - Novolin sliding scale insulin (he is allergic to Novolog)  Hypertension: -Continue amlodipine, Toprol -Hold lisinopril -prn IV hydralazine  CKD-III: Baseline creatinine 1.5-2.5, his creatinine is 2.89 on admission, which is slightly higher than the baseline, likely due to lasix and lisnopril use- now in the 3s -hold lisinopril -IV fluids d/c -ask renal to see  -Strict I and O.   ?capacity- asked psych to see  COPD: No signs of acute exacerbation. -Continue spirava  Tobacco abuse:  -Counseled about the importance of quitting smoking  -nicotine patch  GERD:  -Protonix  Code Status: *Full code.  Family Communication: Care discussed with patient.  Disposition Plan: SNF but patient refusing   Consultants:  Dr Sharol Given, Ortho  Procedures:  none  Antibiotics: Vancomycin 5-16 Zosyn 5-15    HPI/Subjective: No SOB, no CP Asking me where his cereal is Says all his medical issues are due to not  having cereal/fruit and veggies  Objective: Filed Vitals:   11/13/14 1204  BP:   Pulse:   Temp: 99.1 F (37.3 C)  Resp:     Intake/Output Summary (Last 24 hours) at 11/13/14 1238 Last data filed at 11/13/14 1134  Gross per 24 hour  Intake    460 ml  Output   4950 ml  Net  -4490 ml   Filed Weights   11/11/14 0500 11/12/14 0500 11/13/14 0500  Weight: 147 kg (324 lb 1.2 oz) 149 kg (328 lb 7.8 oz) 145.1 kg (319 lb 14.2 oz)    Exam:   General:  NAD  Cardiovascular: irregular  Respiratory: diminished but no wheezing  Abdomen: BS present, soft, nt  Musculoskeletal: left foot wrapped  Data Reviewed: Basic Metabolic Panel:  Recent Labs Lab 11/10/14 2003 11/11/14 0205 11/12/14 0254 11/12/14 1608  NA 139 139 139 137  K 4.8 4.2 4.4 4.9  CL 109 111 108 106  CO2 21* $Remov'22 24 23  'QyVKWI$ GLUCOSE 131* 170* 104* 79  BUN 29* 28* 29* 30*  CREATININE 2.89* 2.86* 3.29* 3.55*  CALCIUM 7.9* 7.4* 7.8* 7.9*  PHOS  --   --   --  5.2*   Liver Function Tests:  Recent Labs Lab 11/11/14 0205 11/12/14 1608  AST 18 22  ALT 15* 17  ALKPHOS 42 45  BILITOT 0.4 0.4  PROT 4.8* 5.7*  ALBUMIN 1.4* 1.7*   No results for input(s): LIPASE, AMYLASE in the last 168 hours. No results for input(s): AMMONIA in the last 168 hours. CBC:  Recent Labs Lab 11/10/14 2003 11/11/14 0205  11/12/14 1608  WBC 11.9* 10.5 9.7  NEUTROABS 8.4*  --  5.5  HGB 11.4* 10.2* 10.5*  HCT 33.8* 30.5* 32.3*  MCV 86.2 86.9 87.8  PLT 233 215 234   Cardiac Enzymes: No results for input(s): CKTOTAL, CKMB, CKMBINDEX, TROPONINI in the last 168 hours. BNP (last 3 results)  Recent Labs  10/15/14 0524 11/05/14 0205 11/11/14 0205  BNP 181.5* 194.8* 169.7*    ProBNP (last 3 results) No results for input(s): PROBNP in the last 8760 hours.  CBG:  Recent Labs Lab 11/12/14 1156 11/12/14 1554 11/12/14 2120 11/13/14 0810 11/13/14 1130  GLUCAP 97 79 97 77 107*    Recent Results (from the past 240 hour(s))   MRSA PCR Screening     Status: None   Collection Time: 11/10/14 11:50 PM  Result Value Ref Range Status   MRSA by PCR NEGATIVE NEGATIVE Final    Comment:        The GeneXpert MRSA Assay (FDA approved for NASAL specimens only), is one component of a comprehensive MRSA colonization surveillance program. It is not intended to diagnose MRSA infection nor to guide or monitor treatment for MRSA infections.   Culture, blood (routine x 2)     Status: None (Preliminary result)   Collection Time: 11/11/14 12:10 AM  Result Value Ref Range Status   Specimen Description BLOOD LEFT WRIST  Final   Special Requests BOTTLES DRAWN AEROBIC AND ANAEROBIC 10CC EA  Final   Culture   Final           BLOOD CULTURE RECEIVED NO GROWTH TO DATE CULTURE WILL BE HELD FOR 5 DAYS BEFORE ISSUING A FINAL NEGATIVE REPORT Note: Culture results may be compromised due to an excessive volume of blood received in culture bottles. Performed at [facility]    Report Status PENDING  Incomplete  Culture, blood (routine x 2)     Status: None (Preliminary result)   Collection Time: 11/11/14 12:27 AM  Result Value Ref Range Status   Specimen Description BLOOD LEFT ANTECUBITAL  Final   Special Requests BOTTLES DRAWN AEROBIC AND ANAEROBIC 10CC EA  Final   Culture   Final           BLOOD CULTURE RECEIVED NO GROWTH TO DATE CULTURE WILL BE HELD FOR 5 DAYS BEFORE ISSUING A FINAL NEGATIVE REPORT Note: Culture results may be compromised due to an excessive volume of blood received in culture bottles. Performed at [facility]    Report Status PENDING  Incomplete     Studies: Dg Chest 2 View  11/12/2014   CLINICAL DATA:  Congestive heart failure  EXAM: CHEST  2 VIEW  COMPARISON:  Radiograph 10/15/2014  FINDINGS: Stable enlarged cardiac silhouette. No effusion, infiltrate, or pneumothorax. No acute osseous abnormality.  IMPRESSION: Cardiomegaly without acute cardiopulmonary findings. No change from prior.    Electronically Signed   By: Suzy Bouchard M.D.   On: 11/12/2014 16:59   US Renal  11/12/2014   CLINICAL DATA:  Acute kidney injury.  EXAM: RENAL / URINARY TRACT ULTRASOUND COMPLETE  COMPARISON:  CT, 07/23/2014.  FINDINGS: Right Kidney:  Length: 13.9 cm. Increased parenchymal echogenicity. No mass or stone. No hydronephrosis.  Left Kidney:  Length: 14.5 cm. Increased parenchymal echogenicity. No mass or stone. No hydronephrosis.  Bladder:  Appears normal for degree of bladder distention.  IMPRESSION: Findings consistent with medical renal disease with increased renal parenchymal echogenicity. No other abnormality. No hydronephrosis.   Electronically Signed   By: Shanon Brow  Ormond M.D.   On: 11/12/2014 17:25    Scheduled Meds: . antiseptic oral rinse  7 mL Mouth Rinse q12n4p  . atorvastatin  10 mg Oral q1800  . chlorhexidine  15 mL Mouth Rinse BID  . cholecalciferol  2,000 Units Oral QPM  . diltiazem  15 mg Intravenous Once  . ferumoxytol  510 mg Intravenous Weekly  . furosemide  120 mg Intravenous BID  . heparin  5,000 Units Subcutaneous 3 times per day  . insulin glargine  22 Units Subcutaneous QHS  . insulin regular  0-5 Units Subcutaneous QHS  . insulin regular  0-9 Units Subcutaneous TID WC  . levothyroxine  88 mcg Oral QAC breakfast  . metoprolol  50 mg Oral BID  . multivitamin with minerals  1 tablet Oral q morning - 10a  . nicotine  21 mg Transdermal Q24H  . pantoprazole  40 mg Oral Daily  . sodium chloride  3 mL Intravenous Q12H  . tiotropium  18 mcg Inhalation Daily  . warfarin  7.5 mg Oral ONCE-1800  . Warfarin - Pharmacist Dosing Inpatient   Does not apply q1800   Continuous Infusions: . sodium chloride 10 mL/hr at 11/11/14 2231  . diltiazem (CARDIZEM) infusion      Principal Problem:   Osteomyelitis of left foot Active Problems:   Atrial fibrillation with RVR   Diabetes mellitus   Hyperlipidemia   Essential hypertension   Bipolar disease, chronic   Hepatitis C    COPD (chronic obstructive pulmonary disease)   Long term current use of anticoagulant therapy   Tobacco use disorder   Osteomyelitis of foot, acute   Acute renal failure superimposed on stage 3 chronic kidney disease   GERD (gastroesophageal reflux disease)   Acute on chronic diastolic CHF (congestive heart failure), NYHA class 3    Time spent: 25 minutes.     Eulogio Bear  [facility] Pager [phone] 7PM-7AM, please contact night-coverage at [email or web address], password [facility] 11/13/2014, 12:38 PM  LOS: 3 days

## 2014-11-13 NOTE — Consult Note (Signed)
[facility] Face-to-Face Psychiatry Consult   Reason for Consult:  Capacity evaluation Referring Physician:  Dr. Eliseo Squires Patient Identification: Johnny Sims MRN:  [id number] Principal Diagnosis: Bipolar 1 disorder, mixed, moderate Diagnosis:   Patient Active Problem List   Diagnosis Date Noted  . Acute on chronic diastolic CHF (congestive heart failure), NYHA class 3 [I50.33]   . GERD (gastroesophageal reflux disease) [K21.9] 11/10/2014  . AKI (acute kidney injury) [N17.9] 09/25/2014  . Acute renal failure superimposed on stage 3 chronic kidney disease [N17.9, N18.3] 09/25/2014  . Osteomyelitis of left foot [M86.9] 09/23/2014  . Osteomyelitis [M86.9] 09/23/2014  . Osteomyelitis of foot, acute [M86.179] 09/23/2014  . Diabetes mellitus without complication [L38.1]   . Tobacco use disorder [Z72.0] 08/28/2014  . Sialolithiasis [K11.5] 12/25/2013  . Long term current use of anticoagulant therapy [Z79.01] 04/08/2013  . Hyponatremia [E87.1] 07/23/2012  . Atrial fibrillation with RVR [I48.91] 07/16/2012  . Diabetes mellitus [E11.9] 07/16/2012  . Hyperlipidemia [E78.5] 07/16/2012  . Essential hypertension [I10] 07/16/2012  . Bipolar disease, chronic [F31.9] 07/16/2012  . Hepatitis C [B19.20] 07/16/2012  . COPD (chronic obstructive pulmonary disease) [J44.9] 07/16/2012    Total Time spent with patient: 45 minutes  Subjective:   Johnny Sims is a 59 y.o. male patient admitted with leg pain.  HPI:  Johnny Sims is a 59 y.o. male admitted to [facility] with leg pain secondary to last osteomyelitis and required surgery. Psychiatric consultation and evaluation requested for bipolar disorder and also capacity evaluation as patient was requesting to be discharged against recommendation of rehabilitation's services. Information for this evaluation obtained from face-to-face evaluation with the patient review of electronic medical records and case discussed with the staff RN and Dr. Eliseo Squires.  Patient was not able to stay awake and has multiple falling back on sleep while sitting and talking. Patient reported he has been suffering with chronic mental illness which causing mood swings, irritability, feeling aggravated, getting distracted, difficulty to focus, memory problems, racing thoughts, impulsive behaviors and inappropriate decision making. Patient also reported he has not seeing any mental health providers and not taking any medication at this time. Patient talks out of blue with the looseness of association, circumstantiality and tangentiality. He also has mild paranoia because when asked about substance abuse patient reported he does not like to talk about it he won't be provided to contact his lawyer, is continued to say he does not understand the loss of [place] because the difference from the loss of other states like [place], continue to pay scale is very much different etc. patient reported that he is asking to be discharged home, he wanted to go into his bed locked in his room and sleep without any distraction from other people. Patient has been somewhat sedated secondary to status post surgery and current medications and reported skin rash feels like almost like poison ivy and repeatedly scratching his back during this evaluation. Patient was unable to understand his complicated and complex medical and surgical problems and needed appropriate treatment at this time.  Past psychiatric history: Patient reported he knows a psychiatrist, Dr. Theresa Mulligan in town over 40 years and was taken lithium, Librium and Seroquel in the past. Patient claims that his psychiatrist is a best friend to him but does not have any contact over several years now. Patient has no current psychiatric medication management.   Social history: Patient reportedly living in an apartment [place] and lives by himself. Patient has been on disability  and has no support system. Patient has no wife or  children. Patient reportedly stated his mother lives in [place] and his brother lives right across the street. Patient does not provide any contact information and does not like to contact either friends or family. 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  HPI Elements:   Location:  Bipolar disorder mixed. Quality:  Mood swings, irritability, insomnia, distractibility and memory difficulties. Severity:  Racing thoughts, paranoia and making poor chances. Timing:  Noncompliant with medication and status post surgery. Duration:  Chronic versus acute. Context:  Psychosocial stresses, lack of support system and being on disability.  Past Medical History:  Past Medical History  Diagnosis Date  . PAF (paroxysmal atrial fibrillation)   . Diabetes mellitus   . Hypothyroidism   . Hyperlipidemia   . Hypertension   . Chronic pain   . OA (osteoarthritis)   . COPD (chronic obstructive pulmonary disease)   . RA (rheumatoid arthritis)   . Hepatitis C   . Venous stasis   . Bipolar 1 disorder   . Hepatitis C   . Fibromyalgia   . Asthma   . Anxiety   . Melanoma     face, shoulder arm  . Pancreatitis, acute 2015  . GERD (gastroesophageal reflux disease)   . CKD (chronic kidney disease), stage II     Past Surgical History  Procedure Laterality Date  . Thyroidectomy    . Tonsillectomy    . US echocardiography  02/19/2010    EF 55-60%  . Skin cancer excision      melonoma  . Fracture surgery Left     arm  . Submandibular gland excision Left 12/25/2013    w/sialolithotomy  . Inguinal hernia repair Bilateral   . Hernia repair      umbicial  . Knee surgery      /H&P 02/20/2010  . Submandibular gland excision Left 12/25/2013    Procedure: SUBMANDIBULAR GLAND STONE AND CHRONIC SIALOLITHIASIS EXCISION;  Surgeon: Melida Quitter, MD;  Location: [place];   Service: ENT;  Laterality: Left;  . Amputation Left 11/11/2014    Procedure: FOOT, SECOND Antero AMPUTATION;  Surgeon: Newt Minion, MD;  Location: [place];  Service: Orthopedics;  Laterality: Left;   Family History:  Family History  Problem Relation Age of Onset  . Hypertension Mother   . Alzheimer's disease Mother   . Heart disease Father    Social History:  History  Alcohol Use  . 0.6 oz/week  . 1 Cans of beer per week     History  Drug Use No    History   Social History  . Marital Status: Single    Spouse Name: N/A  . Number of Children: N/A  . Years of Education: N/A   Social History Main Topics  . Smoking status: Current Every Day Smoker -- 1.00 packs/day for 43 years  . Smokeless tobacco: Not on file  . Alcohol Use: 0.6 oz/week    1 Cans of beer per week  . Drug Use: No  . Sexual Activity: Not on file   Other Topics Concern  . None   Social History Narrative   Additional Social History: Patient living alone and has been on disability but poor compliant with mental health medication management.  Allergies:   Allergies  Allergen Reactions  . Antihistamines, Chlorpheniramine-Type Other (See Comments)    unknown  . Iodinated Diagnostic Agents Other (See Comments)    Patient states he doesn't know what iodine is and doesn't think he is allergic to it despite it being listed with his allergies  . Lidocaine Itching    Patient is uncertain of this allergy  . Methadone Other (See Comments)    "I won't take it anymore"  . Novolog [Insulin Aspart]     "I'm just highly allergic"  . Other Hives    Steroid creams  . Pentazocine Lactate Other (See Comments)    unknown  . Sulfa Antibiotics Itching and Other (See Comments)    whelps  . Vicodin [Hydrocodone-Acetaminophen] Palpitations and Other (See Comments)    Doesn't want to take    Labs:  Results for orders placed or performed during the hospital encounter of 11/10/14 (from the  past 48 hour(s))  Glucose, capillary     Status: None   Collection Time: 11/11/14 12:09 PM  Result Value Ref Range   Glucose-Capillary 66 65 - 99 mg/dL   Comment 1 Document in Chart   Glucose, capillary     Status: None   Collection Time: 11/11/14 12:39 PM  Result Value Ref Range   Glucose-Capillary 89 65 - 99 mg/dL   Comment 1 Document in Chart   Glucose, capillary     Status: None   Collection Time: 11/11/14  4:58 PM  Result Value Ref Range   Glucose-Capillary 78 65 - 99 mg/dL   Comment 1 Notify RN    Comment 2 Document in Chart   Glucose, capillary     Status: Abnormal   Collection Time: 11/11/14  9:08 PM  Result Value Ref Range   Glucose-Capillary 62 (L) 65 - 99 mg/dL  Glucose, capillary     Status: None   Collection Time: 11/11/14  9:34 PM  Result Value Ref Range   Glucose-Capillary 78 65 - 99 mg/dL  Protime-INR     Status: Abnormal   Collection Time: 11/12/14  2:54 AM  Result Value Ref Range   Prothrombin Time 20.8 (H) 11.6 - 15.2 seconds   INR 1.77 (H) 0.00 - 4.85  Basic metabolic panel     Status: Abnormal   Collection Time: 11/12/14  2:54 AM  Result Value Ref Range   Sodium 139 135 - 145 mmol/L   Potassium 4.4 3.5 - 5.1 mmol/L   Chloride 108 101 - 111 mmol/L   CO2 24 22 - 32 mmol/L   Glucose, Bld 104 (H) 65 - 99 mg/dL   BUN 29 (H) 6 - 20 mg/dL   Creatinine, Ser 3.29 (H) 0.61 - 1.24 mg/dL   Calcium 7.8 (L) 8.9 - 10.3 mg/dL   GFR calc non Af Amer 19 (L) >60 mL/min   GFR calc Af Amer 22 (L) >60 mL/min    Comment: (NOTE) The eGFR has been calculated using the CKD EPI equation. This calculation has not been validated in all clinical situations. eGFR's persistently <60 mL/min signify possible Chronic Kidney Disease.    Anion gap 7 5 - 15  Glucose, capillary     Status: Abnormal   Collection Time: 11/12/14  7:40 AM  Result Value Ref Range   Glucose-Capillary 101 (H) 65 - 99 mg/dL  Glucose, capillary     Status: None   Collection Time: 11/12/14 11:56 AM   Result Value Ref Range   Glucose-Capillary 97 65 - 99  mg/dL  Glucose, capillary     Status: None   Collection Time: 11/12/14  3:54 PM  Result Value Ref Range   Glucose-Capillary 79 65 - 99 mg/dL  Iron and TIBC     Status: Abnormal   Collection Time: 11/12/14  4:08 PM  Result Value Ref Range   Iron 10 (L) 45 - 182 ug/dL   TIBC 189 (L) 250 - 450 ug/dL   Saturation Ratios 5 (L) 17.9 - 39.5 %   UIBC 179 ug/dL  CBC     Status: Abnormal   Collection Time: 11/12/14  4:08 PM  Result Value Ref Range   WBC 9.7 4.0 - 10.5 K/uL   RBC 3.68 (L) 4.22 - 5.81 MIL/uL   Hemoglobin 10.5 (L) 13.0 - 17.0 g/dL   HCT 32.3 (L) 39.0 - 52.0 %   MCV 87.8 78.0 - 100.0 fL   MCH 28.5 26.0 - 34.0 pg   MCHC 32.5 30.0 - 36.0 g/dL   RDW 14.8 11.5 - 15.5 %   Platelets 234 150 - 400 K/uL  Differential     Status: Abnormal   Collection Time: 11/12/14  4:08 PM  Result Value Ref Range   Neutrophils Relative % 57 43 - 77 %   Neutro Abs 5.5 1.7 - 7.7 K/uL   Lymphocytes Relative 26 12 - 46 %   Lymphs Abs 2.5 0.7 - 4.0 K/uL   Monocytes Relative 14 (H) 3 - 12 %   Monocytes Absolute 1.4 (H) 0.1 - 1.0 K/uL   Eosinophils Relative 3 0 - 5 %   Eosinophils Absolute 0.3 0.0 - 0.7 K/uL   Basophils Relative 0 0 - 1 %   Basophils Absolute 0.0 0.0 - 0.1 K/uL  Phosphorus     Status: Abnormal   Collection Time: 11/12/14  4:08 PM  Result Value Ref Range   Phosphorus 5.2 (H) 2.5 - 4.6 mg/dL  Comprehensive metabolic panel     Status: Abnormal   Collection Time: 11/12/14  4:08 PM  Result Value Ref Range   Sodium 137 135 - 145 mmol/L   Potassium 4.9 3.5 - 5.1 mmol/L   Chloride 106 101 - 111 mmol/L   CO2 23 22 - 32 mmol/L   Glucose, Bld 79 65 - 99 mg/dL   BUN 30 (H) 6 - 20 mg/dL   Creatinine, Ser 3.55 (H) 0.61 - 1.24 mg/dL   Calcium 7.9 (L) 8.9 - 10.3 mg/dL   Total Protein 5.7 (L) 6.5 - 8.1 g/dL   Albumin 1.7 (L) 3.5 - 5.0 g/dL   AST 22 15 - 41 U/L   ALT 17 17 - 63 U/L   Alkaline Phosphatase 45 38 - 126 U/L   Total  Bilirubin 0.4 0.3 - 1.2 mg/dL   GFR calc non Af Amer 18 (L) >60 mL/min   GFR calc Af Amer 20 (L) >60 mL/min    Comment: (NOTE) The eGFR has been calculated using the CKD EPI equation. This calculation has not been validated in all clinical situations. eGFR's persistently <60 mL/min signify possible Chronic Kidney Disease.    Anion gap 8 5 - 15  Uric acid     Status: None   Collection Time: 11/12/14  4:08 PM  Result Value Ref Range   Uric Acid, Serum 6.6 4.4 - 7.6 mg/dL  C3 complement     Status: None   Collection Time: 11/12/14  4:08 PM  Result Value Ref Range   C3 Complement 120 82 - 167 mg/dL  Comment: (NOTE) Performed At: [facility] [place], [place] [id number] Lindon Romp MD [id number]   C4 complement     Status: None   Collection Time: 11/12/14  4:08 PM  Result Value Ref Range   Complement C4, Body Fluid 17 14 - 44 mg/dL    Comment: (NOTE) Performed At: [facility] [place], [place] [id number] Lindon Romp MD [id number]   Antistreptolysin O titer     Status: None   Collection Time: 11/12/14  4:08 PM  Result Value Ref Range   ASO 40.6 0.0 - 200.0 IU/mL    Comment: (NOTE) Performed At: [facility] [place], [place] [id number] Lindon Romp MD [id number]   HIV antibody     Status: None   Collection Time: 11/12/14  4:08 PM  Result Value Ref Range   HIV Screen 4th Generation wRfx Non Reactive Non Reactive    Comment: (NOTE) Performed At: [facility] [place], [place] [id number] Lindon Romp MD [id number]   Hepatitis B surface antigen     Status: None   Collection Time: 11/12/14  4:08 PM  Result Value Ref Range   Hepatitis B Surface Ag NEGATIVE NEGATIVE    Comment: Performed at [facility]  Hepatitis B surface antibody     Status: Abnormal   Collection Time: 11/12/14  4:08 PM  Result Value Ref Range   Hep B S Ab POSITIVE (A)  NEGATIVE    Comment: Performed at [facility]  Parathyroid hormone, intact (no Ca)     Status: Abnormal   Collection Time: 11/12/14  4:08 PM  Result Value Ref Range   PTH 123 (H) 15 - 65 pg/mL    Comment: (NOTE) Performed At: [facility] [place], [place] [id number] Lindon Romp MD [id number]   Sodium, urine, random     Status: None   Collection Time: 11/12/14  6:30 PM  Result Value Ref Range   Sodium, Ur 55 mmol/L  Creatinine, urine, random     Status: None   Collection Time: 11/12/14  6:30 PM  Result Value Ref Range   Creatinine, Urine 100.21 mg/dL  Urinalysis, Routine w reflex microscopic     Status: Abnormal   Collection Time: 11/12/14  6:30 PM  Result Value Ref Range   Color, Urine AMBER (A) YELLOW    Comment: BIOCHEMICALS MAY BE AFFECTED BY COLOR   APPearance CLOUDY (A) CLEAR   Specific Gravity, Urine 1.020 1.005 - 1.030   pH 6.0 5.0 - 8.0   Glucose, UA NEGATIVE NEGATIVE mg/dL   Hgb urine dipstick LARGE (A) NEGATIVE   Bilirubin Urine NEGATIVE NEGATIVE   Ketones, ur NEGATIVE NEGATIVE mg/dL   Protein, ur >300 (A) NEGATIVE mg/dL   Urobilinogen, UA 0.2 0.0 - 1.0 mg/dL   Nitrite NEGATIVE NEGATIVE   Leukocytes, UA NEGATIVE NEGATIVE  Urine microscopic-add on     Status: Abnormal   Collection Time: 11/12/14  6:30 PM  Result Value Ref Range   Squamous Epithelial / LPF RARE RARE   WBC, UA 3-6 <3 WBC/hpf   RBC / HPF 11-20 <3 RBC/hpf   Bacteria, UA RARE RARE   Casts HYALINE CASTS (A) NEGATIVE    Comment: GRANULAR CAST  Glucose, capillary     Status: None   Collection Time: 11/12/14  9:20 PM  Result Value Ref Range   Glucose-Capillary 97 65 - 99 mg/dL  Protime-INR  Status: Abnormal   Collection Time: 11/13/14  4:00 AM  Result Value Ref Range   Prothrombin Time 28.1 (H) 11.6 - 15.2 seconds   INR 2.61 (H) 0.00 - 1.49  Glucose, capillary     Status: None   Collection Time: 11/13/14  8:10 AM  Result Value Ref Range    Glucose-Capillary 77 65 - 99 mg/dL    Vitals: Blood pressure 132/62, pulse 118, temperature 98.7 F (37.1 C), temperature source Oral, resp. rate 12, height $RemoveBe'6\' 9"'TqzwUcTNr$  (2.057 m), weight 145.1 kg (319 lb 14.2 oz), SpO2 100 %.  Risk to Self: Is patient at risk for suicide?: No Risk to Others:   Prior Inpatient Therapy:   Prior Outpatient Therapy:    Current Facility-Administered Medications  Medication Dose Route Frequency Provider Last Rate Last Dose  . 0.9 %  sodium chloride infusion   Intravenous Continuous Newt Minion, MD 10 mL/hr at 11/11/14 2231    . alum & mag hydroxide-simeth (MAALOX/MYLANTA) 200-200-20 MG/5ML suspension 30 mL  30 mL Oral Q6H PRN Ivor Costa, MD      . amiodarone (NEXTERONE PREMIX) 360 MG/200ML (1.8 mg/mL) IV infusion  60 mg/hr Intravenous Continuous Dorothy Spark, MD 33.3 mL/hr at 11/13/14 1130 60 mg/hr at 11/13/14 1130   Followed by  . amiodarone (NEXTERONE PREMIX) 360 MG/200ML (1.8 mg/mL) IV infusion  30 mg/hr Intravenous Continuous Dorothy Spark, MD      . antiseptic oral rinse (CPC / CETYLPYRIDINIUM CHLORIDE 0.05%) solution 7 mL  7 mL Mouth Rinse q12n4p Belkys A Regalado, MD   7 mL at 11/13/14 1200  . atorvastatin (LIPITOR) tablet 10 mg  10 mg Oral q1800 Wynell Balloon, RPH   10 mg at 11/12/14 1724  . chlorhexidine (PERIDEX) 0.12 % solution 15 mL  15 mL Mouth Rinse BID Belkys A Regalado, MD   15 mL at 11/13/14 0800  . cholecalciferol (VITAMIN D) tablet 2,000 Units  2,000 Units Oral QPM Ivor Costa, MD   2,000 Units at 11/12/14 1724  . diazepam (VALIUM) tablet 10 mg  10 mg Oral Q8H PRN Belkys A Regalado, MD   10 mg at 11/13/14 0816  . ferumoxytol (FERAHEME) 510 mg in sodium chloride 0.9 % 100 mL IVPB  510 mg Intravenous Weekly Mauricia Area, MD   510 mg at 11/13/14 1128  . fluticasone (FLONASE) 50 MCG/ACT nasal spray 1 spray  1 spray Each Nare Daily PRN Ivor Costa, MD      . furosemide (LASIX) 120 mg in dextrose 5 % 50 mL IVPB  120 mg Intravenous BID Dorothy Spark, MD      . heparin injection 5,000 Units  5,000 Units Subcutaneous 3 times per day Ivor Costa, MD   5,000 Units at 11/13/14 0536  . hydrALAZINE (APRESOLINE) injection 5 mg  5 mg Intravenous Q2H PRN Ivor Costa, MD      . insulin glargine (LANTUS) injection 22 Units  22 Units Subcutaneous QHS Ivor Costa, MD   22 Units at 11/12/14 2200  . insulin regular (NOVOLIN R,HUMULIN R) 100 units/mL injection 0-5 Units  0-5 Units Subcutaneous QHS Ivor Costa, MD      . insulin regular (NOVOLIN R,HUMULIN R) 100 units/mL injection 0-9 Units  0-9 Units Subcutaneous TID WC Ivor Costa, MD   0 Units at 11/11/14 0800  . levalbuterol (XOPENEX) nebulizer solution 0.63 mg  0.63 mg Nebulization Q6H PRN Rella Larve, MD   0.63 mg at 11/12/14 0948  . levothyroxine (SYNTHROID, LEVOTHROID)  tablet 88 mcg  88 mcg Oral QAC breakfast Ivor Costa, MD   88 mcg at 11/13/14 0758  . loratadine (CLARITIN) tablet 10 mg  10 mg Oral Daily PRN Ivor Costa, MD      . methocarbamol (ROBAXIN) tablet 500 mg  500 mg Oral Q6H PRN Newt Minion, MD   500 mg at 11/12/14 2205   Or  . methocarbamol (ROBAXIN) 500 mg in dextrose 5 % 50 mL IVPB  500 mg Intravenous Q6H PRN Newt Minion, MD      . metoCLOPramide (REGLAN) tablet 5-10 mg  5-10 mg Oral Q8H PRN Newt Minion, MD       Or  . metoCLOPramide (REGLAN) injection 5-10 mg  5-10 mg Intravenous Q8H PRN Newt Minion, MD      . metoprolol (LOPRESSOR) tablet 50 mg  50 mg Oral BID Dorothy Spark, MD      . morphine 2 MG/ML injection 1 mg  1 mg Intravenous Q4H PRN Belkys A Regalado, MD   1 mg at 11/11/14 1151  . multivitamin with minerals tablet 1 tablet  1 tablet Oral q morning - 10a Ivor Costa, MD   1 tablet at 11/13/14 1135  . nicotine (NICODERM CQ - dosed in mg/24 hours) patch 21 mg  21 mg Transdermal Q24H Ivor Costa, MD   21 mg at 11/11/14 0018  . ondansetron (ZOFRAN) injection 4 mg  4 mg Intravenous Q6H PRN Newt Minion, MD      . ondansetron [facility]) tablet 4 mg  4 mg Oral Q6H PRN Ivor Costa, MD       . oxyCODONE (Oxy IR/ROXICODONE) immediate release tablet 30 mg  30 mg Oral Q4H PRN Rhetta Mura Schorr, NP   30 mg at 11/13/14 0815  . pantoprazole (PROTONIX) EC tablet 40 mg  40 mg Oral Daily Ivor Costa, MD   40 mg at 11/13/14 1134  . sodium chloride 0.9 % injection 3 mL  3 mL Intravenous Q12H Ivor Costa, MD   3 mL at 11/12/14 2201  . tiotropium (SPIRIVA) inhalation capsule 18 mcg  18 mcg Inhalation Daily Ivor Costa, MD   18 mcg at 11/13/14 0836  . warfarin (COUMADIN) tablet 7.5 mg  7.5 mg Oral ONCE-1800 Cassie Jodean Lima, RPH      . Warfarin - Pharmacist Dosing Inpatient   Does not apply [id number] Ivor Costa, MD   0  at 11/11/14 1800    Musculoskeletal: Strength & Muscle Tone: decreased Gait & Station: unable to stand Patient leans: N/A  Psychiatric Specialty Exam: Physical Exam as per history and physical   ROS skin rash on his back, itching, insomnia, leg pain and swelling, distractibility memory problems, getting aggravated and denies visual or hearing impairment, denies shortness of breath, chest pain, negative for review of systems except history of present illness.   Blood pressure 132/62, pulse 118, temperature 98.7 F (37.1 C), temperature source Oral, resp. rate 12, height $RemoveBe'6\' 9"'CnJLQwDQq$  (2.057 m), weight 145.1 kg (319 lb 14.2 oz), SpO2 100 %.Body mass index is 34.29 kg/(m^2).  General Appearance: Bizarre, Disheveled and Guarded  Eye Contact::  Fair  Speech:  Blocked, Pressured and Slurred  Volume:  Decreased  Mood:  Anxious, Depressed and Irritable  Affect:  Non-Congruent, Inappropriate and Labile  Thought Process:  Circumstantial, Disorganized, Irrelevant and Tangential  Orientation:  Full (Time, Place, and Person)  Thought Content:  Paranoid Ideation and Rumination  Suicidal Thoughts:  No  Homicidal Thoughts:  No  Memory:  Immediate;   Fair Recent;   Poor  Judgement:  Impaired  Insight:  Shallow  Psychomotor Activity:  Restlessness  Concentration:  Poor  Recall:  [place] of  Knowledge:Good  Language: Good  Akathisia:  Negative  Handed:  Right  AIMS (if indicated):     Assets:  Communication Skills Desire for Improvement Financial Resources/Insurance Housing Leisure Time Resilience  ADL's:  Impaired  Cognition: Impaired,  Moderate  Sleep:      Medical Decision Making: Review of Psycho-Social Stressors (1), Review or order clinical lab tests (1), Discuss test with performing physician (1), Established Problem, Worsening (2), Review or order medicine tests (1), Review of Medication Regimen & Side Effects (2) and Review of New Medication or Change in Dosage (2)  Treatment Plan Summary: Patient has multiple symptoms of bipolar, mixed symptoms, noncompliant with medication management and multiple medical problems status post surgery. Patient has limited insight and judgment into his current enteral status and needed medication treatment.  Daily contact with patient to assess and evaluate symptoms and progress in treatment and Medication management  Plan: Patient does not meet criteria for capacity to make his own medical decisions and living arrangements as per my evaluation today  Referred to the psychiatric social service regarding obtaining collateral Information and possible psychosocial history from the family members.  Bipolar mixed: Depakote 500 mg twice daily and Seroquel 50 mg at bedtime for mood swings  Anxiety and agitation: Continue Valium 10 mg 3 times a day as needed  Itching: Benadryl 50 mg every 6 hours as needed for itching  Patient does not meet criteria for psychiatric inpatient admission. Supportive therapy provided about ongoing stressors.  Disposition: Patient need reevaluation when improves his mental status and also needed outpatient psychiatric services as he does not meet criteria for inpatient psychiatric hospitalization at this time.  Shunda Rabadi,JANARDHAHA R. 11/13/2014 11:59 AM

## 2014-11-13 NOTE — Progress Notes (Signed)
Pt arrived to 5W04 and oriented to surroundings.  Safety sitter at bedside.  Will continue to monitor.

## 2014-11-14 DIAGNOSIS — F3162 Bipolar disorder, current episode mixed, moderate: Secondary | ICD-10-CM

## 2014-11-14 LAB — RENAL FUNCTION PANEL
ALBUMIN: 1.5 g/dL — AB (ref 3.5–5.0)
ANION GAP: 9 (ref 5–15)
BUN: 31 mg/dL — ABNORMAL HIGH (ref 6–20)
CALCIUM: 8 mg/dL — AB (ref 8.9–10.3)
CO2: 24 mmol/L (ref 22–32)
Chloride: 102 mmol/L (ref 101–111)
Creatinine, Ser: 3.85 mg/dL — ABNORMAL HIGH (ref 0.61–1.24)
GFR, EST AFRICAN AMERICAN: 18 mL/min — AB (ref >60)
GFR, EST NON AFRICAN AMERICAN: 16 mL/min — AB (ref >60)
GLUCOSE: 128 mg/dL — AB (ref 65–99)
POTASSIUM: 4 mmol/L (ref 3.5–5.1)
Phosphorus: 4.9 mg/dL — ABNORMAL HIGH (ref 2.5–4.6)
SODIUM: 135 mmol/L (ref 135–145)

## 2014-11-14 LAB — GLUCOSE, CAPILLARY
GLUCOSE-CAPILLARY: 123 mg/dL — AB (ref 65–99)
GLUCOSE-CAPILLARY: 140 mg/dL — AB (ref 65–99)
Glucose-Capillary: 119 mg/dL — ABNORMAL HIGH (ref 65–99)
Glucose-Capillary: 107 mg/dL — ABNORMAL HIGH (ref 65–99)

## 2014-11-14 LAB — CBC
HCT: 30.8 % — ABNORMAL LOW (ref 39.0–52.0)
Hemoglobin: 10.3 g/dL — ABNORMAL LOW (ref 13.0–17.0)
MCH: 28.9 pg (ref 26.0–34.0)
MCHC: 33.4 g/dL (ref 30.0–36.0)
MCV: 86.5 fL (ref 78.0–100.0)
Platelets: 238 10*3/uL (ref 150–400)
RBC: 3.56 MIL/uL — ABNORMAL LOW (ref 4.22–5.81)
RDW: 14.4 % (ref 11.5–15.5)
WBC: 7.5 10*3/uL (ref 4.0–10.5)

## 2014-11-14 LAB — PROTIME-INR
INR: 2.85 — ABNORMAL HIGH (ref 0.00–1.49)
Prothrombin Time: 29.5 seconds — ABNORMAL HIGH (ref 11.6–15.2)

## 2014-11-14 MED ORDER — FUROSEMIDE 80 MG PO TABS
160.0000 mg | ORAL_TABLET | Freq: Three times a day (TID) | ORAL | Status: DC
  Filled 2014-11-14 (×3): qty 2

## 2014-11-14 MED ORDER — MAGNESIUM CITRATE PO SOLN
1.0000 | Freq: Once | ORAL | Status: AC
Start: 2014-11-14 — End: 2014-11-14
  Administered 2014-11-14: 1 via ORAL
  Filled 2014-11-14: qty 296

## 2014-11-14 MED ORDER — CALCITRIOL 0.25 MCG PO CAPS
0.2500 ug | ORAL_CAPSULE | Freq: Every day | ORAL | Status: DC
  Administered 2014-11-14 – 2014-11-17 (×4): 0.25 ug via ORAL
  Filled 2014-11-14 (×4): qty 1

## 2014-11-14 MED ORDER — METOPROLOL TARTRATE 25 MG PO TABS
25.0000 mg | ORAL_TABLET | Freq: Two times a day (BID) | ORAL | Status: DC
  Administered 2014-11-14 – 2014-11-17 (×7): 25 mg via ORAL
  Filled 2014-11-14 (×8): qty 1

## 2014-11-14 MED ORDER — QUETIAPINE FUMARATE 100 MG PO TABS
100.0000 mg | ORAL_TABLET | Freq: Every day | ORAL | Status: DC
  Filled 2014-11-14 (×4): qty 1

## 2014-11-14 MED ORDER — WARFARIN SODIUM 5 MG PO TABS
5.0000 mg | ORAL_TABLET | Freq: Once | ORAL | Status: AC
  Administered 2014-11-14: 5 mg via ORAL
  Filled 2014-11-14: qty 1

## 2014-11-14 MED ORDER — FLEET ENEMA 7-19 GM/118ML RE ENEM
1.0000 | ENEMA | Freq: Every day | RECTAL | Status: DC | PRN
  Filled 2014-11-14: qty 1

## 2014-11-14 MED ORDER — FUROSEMIDE 80 MG PO TABS
160.0000 mg | ORAL_TABLET | Freq: Two times a day (BID) | ORAL | Status: DC
  Administered 2014-11-14 – 2014-11-17 (×7): 160 mg via ORAL
  Filled 2014-11-14 (×9): qty 2

## 2014-11-14 MED ORDER — BISACODYL 10 MG RE SUPP: 10.0000 mg | Freq: Every day | RECTAL | Status: DC | PRN

## 2014-11-14 NOTE — Progress Notes (Signed)
Patient needs to be NWB for 3 weeks for recovery- discussed with Dr. Sande Brothers DO

## 2014-11-14 NOTE — Clinical Social Work Note (Signed)
CSW notes that psychiatry still states patient does not have capacity to make decisions. Patient's brother Johnny Sims states that he can assist with making decisions but will not be able to physically sign the patient into a facility. The patient does not have any other known local contacts so finding someone to sign the patient into a SNF will be unlikely. If patient is deemed to have capacity prior to DC, he can sign himself into a facility. The patient states that he is agreeable to go to SNF and 3 facilities have offered for him ([place] and [place]). The facilities are able to do a weekend admission if medically stable over weekend for DC. CSW has left report for weekend CSW.   Liz Beach MSW, [place], [place], [phone]

## 2014-11-14 NOTE — Progress Notes (Signed)
In with patient at this time to review medication regimen.  Informed of what was ordered for him at bedtime.  When informed of Seroquel and Depakote, patient stated "I have never taken Seroquel or Depakote; I don't take bipolar medicines."  When asked what does he take for Bipolar-ism, he states "who said I was bipolar?  Where did you get that from?  That should not be on my records because I am not bipolar and don't take bipolar medicines!"  Patient at this time threatened to leave facility and told this nurse to get out of his room.  Scheduled sitter at bedside; witnessed conversation.  Charge nurse made aware of situation and sent into room.

## 2014-11-14 NOTE — Progress Notes (Signed)
Physical Therapy Treatment Patient Details Name: Johnny Sims MRN: [id number] DOB: 1955-11-12 Today's Date: 11/14/2014    History of Present Illness Pt is a 59 y.o. Male PMH PAF, DM, HTN, OA, COPD, RA, Fibromyalgia, CKD stage II, Hep C, Bipolar disorder, anxiety admitted 11/10/14 with osteomyelitis of 2nd toe on L foot. 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     PT Comments    Patient progressed with ability to mobilize in the hallway in wheelchair, but still will not comply to NWB left LE stating he should  Be able to put weight on left heel.  Feel patient will need SNF placement as though he can mobilize in wheelchair, states chair will not fit through doorways in his home.  Will follow acutely.  Follow Up Recommendations  SNF     Equipment Recommendations  Wheelchair (measurements PT);Wheelchair cushion (measurements PT)    Recommendations for Other Services       Precautions / Restrictions Precautions Precautions: Fall Restrictions Weight Bearing Restrictions: Yes LLE Weight Bearing: Non weight bearing    Mobility  Bed Mobility Overal bed mobility: Modified Independent             General bed mobility comments: pt moves quickly and impulsively.   Transfers Overall transfer level: Needs assistance Equipment used: None (initially knee walker) Transfers: Stand Pivot Transfers;Squat Pivot Transfers Sit to Stand: Min assist;+2 safety/equipment Stand pivot transfers: Min assist Squat pivot transfers: Min guard     General transfer comment: Demonstrated squat pivot with NWB left foot and pt performed with uncontrolled descent into chair.  STood first attempt to knee walker to attempt ambulation, but unable due to knee pain and reports h/o torn ACL  Ambulation/Gait                 Hotel manager mobility: Yes Wheelchair propulsion: Both upper extremities Wheelchair parts: Needs  assistance Distance: 200 Wheelchair Assistance Details (indicate cue type and reason): assist to avoid running into obstacles on right side, assist for safety due to poor judgement, impulsivity  Modified Rankin (Stroke Patients Only)       Balance     Sitting balance-Leahy Scale: Good       Standing balance-Leahy Scale: Poor                      Cognition Arousal/Alertness: Awake/alert Behavior During Therapy: Impulsive;Anxious Overall Cognitive Status: No family/caregiver present to determine baseline cognitive functioning Area of Impairment: Attention;Memory;Following commands;Safety/judgement;Problem solving   Current Attention Level: Focused;Sustained Memory: Decreased short-term memory Following Commands: Follows one step commands inconsistently Safety/Judgement: Decreased awareness of safety   Problem Solving: Slow processing;Difficulty sequencing;Requires verbal cues      Exercises      General Comments General comments (skin integrity, edema, etc.): Patient needed redirection and cues for focus when being taught squat pivot transfers due to talking over me and poor attention; falling asleep at end of session while talking.      Pertinent Vitals/Pain Pain Assessment: Faces Faces Pain Scale: Hurts little more Pain Location: left foot Pain Intervention(s): Monitored during session    Home Living                      Prior Function            PT Goals (current goals can now be found in the care plan  section) Progress towards PT goals: Progressing toward goals (added wheelchair goal)    Frequency  Min 3X/week    PT Plan Current plan remains appropriate    Co-evaluation             End of Session Equipment Utilized During Treatment: Gait belt Activity Tolerance: Patient tolerated treatment well Patient left: in bed;with call bell/phone within reach     Time: 1448-1517 PT Time Calculation (min) (ACUTE ONLY): 29  min  Charges:  $Therapeutic Activity: 8-22 mins $Wheel Chair Management: 8-22 mins                    G Codes:      Tytus Strahle,CYNDI 16-Nov-2014, 4:44 PM  Magda Kiel, [place] Nov 16, 2014

## 2014-11-14 NOTE — Progress Notes (Signed)
Utilization review complete. Sophronia Varney RN CCM Case Mgmt phone [phone] 

## 2014-11-14 NOTE — Progress Notes (Signed)
Occupational Therapy Treatment Patient Details Name: Johnny Sims MRN: [id number] DOB: 05-11-1956 Today's Date: 11/14/2014    History of present illness Pt is a 59 y.o. Male PMH PAF, DM, HTN, OA, COPD, RA, Fibromyalgia, CKD stage II, Hep C, Bipolar disorder, anxiety admitted 11/10/14 with osteomyelitis of 2nd toe on L foot. 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    OT comments  Pt is progressing towards goals of POC however is limited by mental health issues and decreased mobility. Pt is unable to maintain NWB status and presents with impulsivity and decreased safety awareness. Continue to recommend SNF at d/c and acute OT will continue with POC.   Follow Up Recommendations  SNF    Equipment Recommendations  3 in 1 bedside comode    Recommendations for Other Services      Precautions / Restrictions Precautions Precautions: Fall Restrictions Weight Bearing Restrictions: Yes LLE Weight Bearing: Non weight bearing       Mobility Bed Mobility Overal bed mobility: Independent             General bed mobility comments: pt moves quickly and impulsively.   Transfers Overall transfer level: Needs assistance Equipment used: Rolling walker (2 wheeled) Transfers: Sit to/from Stand Sit to Stand: Min assist;+2 safety/equipment         General transfer comment: Pt stood impulsively and OT placed foot under pt's LLE to ensure NWB. Pt was unable to maintain NWB status during transition as he uses momentum to propel himself forward.         ADL Overall ADL's : Needs assistance/impaired             Lower Body Bathing: Sit to/from stand;Moderate assistance       Lower Body Dressing: Moderate assistance;Sit to/from stand   Toilet Transfer: Minimal assistance;+2 for safety/equipment;RW Toilet Transfer Details (indicate cue type and reason): practiced sit<>stand only           General ADL Comments: Pt presents with pressured speech and talking over OT, unable  to follow commands well. Pt is impulsive and attempted to stand quickly. When discussing SNF, pt explained that he will consider SNF if he can have a private room and reports "they won't take me because I smoke." Then pt explained that his insurance would cover a room but pt did not want a room mate. Pt answered specific questions very tangentially.                 Cognition  Arousal/Alertness: Awake/Alert Behavior During Therapy: Anxious;Restless;Impulsive Overall Cognitive Status: No family/caregiver present to determine baseline cognitive functioning Area of Impairment: Attention;Memory;Following commands;Safety/judgement;Problem solving   Current Attention Level: Focused;Sustained Memory: Decreased short-term memory  Following Commands: Follows one step commands inconsistently Safety/Judgement: Decreased awareness of safety;Decreased awareness of deficits   Problem Solving: Slow processing;Difficulty sequencing;Requires verbal cues;Requires tactile cues  Pt perseverated on pain medications and amount he is taking, reporting this is not enough and explaining that the manufacturers make a "5mg  pill that isn't actually 5mg " and spent a significant amount of time explaining his medication situation and tolerance.             General Comments      Pertinent Vitals/ Pain       Pain Assessment: No/denies pain         Frequency Min 2X/week     Progress Toward Goals  OT Goals(current goals can now be found in the care plan section)  Progress towards OT  goals: Progressing toward goals     Plan Discharge plan remains appropriate       End of Session Equipment Utilized During Treatment: Rolling walker   Activity Tolerance Patient tolerated treatment well   Patient Left in bed;with call bell/phone within reach;with nursing/sitter in room   Nurse Communication          Time: 6503-5465 OT Time Calculation (min): 23 min  Charges: OT General Charges $OT Visit: 1  Procedure OT Treatments $Self Care/Home Management : 23-37 mins  Juluis Rainier 11/14/2014, 2:35 PM  Cyndie Chime, OTR/L Occupational Therapist [phone] (pager)

## 2014-11-14 NOTE — Consult Note (Signed)
Psychiatry Consult follow-up  Reason for Consult:  Capacity evaluation Referring Physician:  Dr. Eliseo Squires Patient Identification: Johnny Sims MRN:  [id number] Principal Diagnosis: Bipolar 1 disorder, mixed, moderate Diagnosis:   Patient Active Problem List   Diagnosis Date Noted  . Bipolar 1 disorder, mixed, moderate [F31.62] 11/13/2014  . Acute on chronic diastolic CHF (congestive heart failure), NYHA class 3 [I50.33]   . GERD (gastroesophageal reflux disease) [K21.9] 11/10/2014  . AKI (acute kidney injury) [N17.9] 09/25/2014  . Acute renal failure superimposed on stage 3 chronic kidney disease [N17.9, N18.3] 09/25/2014  . Osteomyelitis of left foot [M86.9] 09/23/2014  . Osteomyelitis [M86.9] 09/23/2014  . Osteomyelitis of foot, acute [M86.179] 09/23/2014  . Diabetes mellitus without complication [Y77.4]   . Tobacco use disorder [Z72.0] 08/28/2014  . Sialolithiasis [K11.5] 12/25/2013  . Long term current use of anticoagulant therapy [Z79.01] 04/08/2013  . Hyponatremia [E87.1] 07/23/2012  . Atrial fibrillation with RVR [I48.91] 07/16/2012  . Diabetes mellitus [E11.9] 07/16/2012  . Hyperlipidemia [E78.5] 07/16/2012  . Essential hypertension [I10] 07/16/2012  . Bipolar disease, chronic [F31.9] 07/16/2012  . Hepatitis C [B19.20] 07/16/2012  . COPD (chronic obstructive pulmonary disease) [J44.9] 07/16/2012    Total Time spent with patient: 30 minutes  Subjective:   Johnny Sims is a 59 y.o. male patient admitted with leg pain.  HPI:  Johnny Sims is a 59 y.o. male admitted to [facility] with leg pain secondary to last osteomyelitis and required surgery. Psychiatric consultation and evaluation requested for bipolar disorder and also capacity evaluation as patient was requesting to be discharged against recommendation of rehabilitation's services. Information for this evaluation obtained from face-to-face evaluation with the patient review of electronic medical records and  case discussed with the staff RN and Dr. Eliseo Squires. Patient was not able to stay awake and has multiple falling back on sleep while sitting and talking. Patient reported he has been suffering with chronic mental illness which causing mood swings, irritability, feeling aggravated, getting distracted, difficulty to focus, memory problems, racing thoughts, impulsive behaviors and inappropriate decision making. Patient also reported he has not seeing any mental health providers and not taking any medication at this time. Patient talks out of blue with the looseness of association, circumstantiality and tangentiality. He also has mild paranoia because when asked about substance abuse patient reported he does not like to talk about it he won't be provided to contact his lawyer, is continued to say he does not understand the loss of [place] because the difference from the loss of other states like [place], continue to pay scale is very much different etc. patient reported that he is asking to be discharged home, he wanted to go into his bed locked in his room and sleep without any distraction from other people. Patient has been somewhat sedated secondary to status post surgery and current medications and reported skin rash feels like almost like poison ivy and repeatedly scratching his back during this evaluation. Patient was unable to understand his complicated and complex medical and surgical problems and needed appropriate treatment at this time.  Past psychiatric history: Patient reported he knows a psychiatrist, Dr. Theresa Mulligan in town over 40 years and was taken lithium, Librium and Seroquel in the past. Patient claims that his psychiatrist is a best friend to him but does not have any contact over several years now. Patient has no current psychiatric medication management. Social history: Patient reportedly living in an apartment [place] and lives by himself.  Patient has been on disability and has  no support system. Patient has no wife or children. Patient reportedly stated his mother lives in [place] and his brother lives right across the street. Patient does not provide any contact information and does not like to contact either friends or family. 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  Interval history: Patient continued to suffer with symptoms of bipolar disorder and leg pain. Patient stated he has not been sleeping well because of racing thoughts. Patient has been sleeping this early morning. Staff RN reported patient has refused to take his medication Depakote which was provided yesterday saying he does not have a diagnosis of bipolar disorder. Patient also reported he was not given his pain medication and benzodiazepines from his a primary pain medication management. Patient reported he need to find a new pain management clinic. Reportedly patient has a back with a lot of pain medication. Patient is requesting more pain medication and Valium. Patient continued to have poor insight into his mental health condition and poor coping skills. Patient denies suicidal or homicidal ideation and no evidence of psychosis at this time.  Past Medical History:  Past Medical History  Diagnosis Date  . PAF (paroxysmal atrial fibrillation)   . Diabetes mellitus   . Hypothyroidism   . Hyperlipidemia   . Hypertension   . Chronic pain   . OA (osteoarthritis)   . COPD (chronic obstructive pulmonary disease)   . RA (rheumatoid arthritis)   . Hepatitis C   . Venous stasis   . Bipolar 1 disorder   . Hepatitis C   . Fibromyalgia   . Asthma   . Anxiety   . Melanoma     face, shoulder arm  . Pancreatitis, acute 2015  . GERD (gastroesophageal reflux disease)   . CKD (chronic kidney disease), stage II     Past Surgical History  Procedure Laterality Date   . Thyroidectomy    . Tonsillectomy    . US echocardiography  02/19/2010    EF 55-60%  . Skin cancer excision      melonoma  . Fracture surgery Left     arm  . Submandibular gland excision Left 12/25/2013    w/sialolithotomy  . Inguinal hernia repair Bilateral   . Hernia repair      umbicial  . Knee surgery      /H&P 02/20/2010  . Submandibular gland excision Left 12/25/2013    Procedure: SUBMANDIBULAR GLAND STONE AND CHRONIC SIALOLITHIASIS EXCISION;  Surgeon: Melida Quitter, MD;  Location: [place];  Service: ENT;  Laterality: Left;  . Amputation Left 11/11/2014    Procedure: FOOT, SECOND [place] AMPUTATION;  Surgeon: Newt Minion, MD;  Location: [place];  Service: Orthopedics;  Laterality: Left;   Family History:  Family History  Problem Relation Age of Onset  . Hypertension Mother   . Alzheimer's disease Mother   . Heart disease Father    Social History:  History  Alcohol Use  . 0.6 oz/week  . 1 Cans of beer per week     History  Drug Use No    History   Social History  . Marital Status: Single    Spouse Name: N/A  . Number of Children: N/A  . Years of Education: N/A   Social History Main Topics  . Smoking status: Current Every Day Smoker --  1.00 packs/day for 43 years  . Smokeless tobacco: Not on file  . Alcohol Use: 0.6 oz/week    1 Cans of beer per week  . Drug Use: No  . Sexual Activity: Not on file   Other Topics Concern  . None   Social History Narrative   Additional Social History: Patient living alone and has been on disability but poor compliant with mental health medication management.                          Allergies:   Allergies  Allergen Reactions  . Antihistamines, Chlorpheniramine-Type Other (See Comments)    unknown  . Iodinated Diagnostic Agents Other (See Comments)    Patient states he doesn't know what iodine is and doesn't think he is allergic to it despite it being listed with his allergies  . Lidocaine Itching    Patient is  uncertain of this allergy  . Methadone Other (See Comments)    "I won't take it anymore"  . Novolog [Insulin Aspart]     "I'm just highly allergic"  . Other Hives    Steroid creams  . Pentazocine Lactate Other (See Comments)    unknown  . Sulfa Antibiotics Itching and Other (See Comments)    whelps  . Vicodin [Hydrocodone-Acetaminophen] Palpitations and Other (See Comments)    Doesn't want to take    Labs:  Results for orders placed or performed during the hospital encounter of 11/10/14 (from the past 48 hour(s))  Glucose, capillary     Status: None   Collection Time: 11/12/14  3:54 PM  Result Value Ref Range   Glucose-Capillary 79 65 - 99 mg/dL  Iron and TIBC     Status: Abnormal   Collection Time: 11/12/14  4:08 PM  Result Value Ref Range   Iron 10 (L) 45 - 182 ug/dL   TIBC 189 (L) 250 - 450 ug/dL   Saturation Ratios 5 (L) 17.9 - 39.5 %   UIBC 179 ug/dL  CBC     Status: Abnormal   Collection Time: 11/12/14  4:08 PM  Result Value Ref Range   WBC 9.7 4.0 - 10.5 K/uL   RBC 3.68 (L) 4.22 - 5.81 MIL/uL   Hemoglobin 10.5 (L) 13.0 - 17.0 g/dL   HCT 32.3 (L) 39.0 - 52.0 %   MCV 87.8 78.0 - 100.0 fL   MCH 28.5 26.0 - 34.0 pg   MCHC 32.5 30.0 - 36.0 g/dL   RDW 14.8 11.5 - 15.5 %   Platelets 234 150 - 400 K/uL  Differential     Status: Abnormal   Collection Time: 11/12/14  4:08 PM  Result Value Ref Range   Neutrophils Relative % 57 43 - 77 %   Neutro Abs 5.5 1.7 - 7.7 K/uL   Lymphocytes Relative 26 12 - 46 %   Lymphs Abs 2.5 0.7 - 4.0 K/uL   Monocytes Relative 14 (H) 3 - 12 %   Monocytes Absolute 1.4 (H) 0.1 - 1.0 K/uL   Eosinophils Relative 3 0 - 5 %   Eosinophils Absolute 0.3 0.0 - 0.7 K/uL   Basophils Relative 0 0 - 1 %   Basophils Absolute 0.0 0.0 - 0.1 K/uL  Phosphorus     Status: Abnormal   Collection Time: 11/12/14  4:08 PM  Result Value Ref Range   Phosphorus 5.2 (H) 2.5 - 4.6 mg/dL  Comprehensive metabolic panel     Status: Abnormal  Collection Time:  11/12/14  4:08 PM  Result Value Ref Range   Sodium 137 135 - 145 mmol/L   Potassium 4.9 3.5 - 5.1 mmol/L   Chloride 106 101 - 111 mmol/L   CO2 23 22 - 32 mmol/L   Glucose, Bld 79 65 - 99 mg/dL   BUN 30 (H) 6 - 20 mg/dL   Creatinine, Ser 3.55 (H) 0.61 - 1.24 mg/dL   Calcium 7.9 (L) 8.9 - 10.3 mg/dL   Total Protein 5.7 (L) 6.5 - 8.1 g/dL   Albumin 1.7 (L) 3.5 - 5.0 g/dL   AST 22 15 - 41 U/L   ALT 17 17 - 63 U/L   Alkaline Phosphatase 45 38 - 126 U/L   Total Bilirubin 0.4 0.3 - 1.2 mg/dL   GFR calc non Af Amer 18 (L) >60 mL/min   GFR calc Af Amer 20 (L) >60 mL/min    Comment: (NOTE) The eGFR has been calculated using the CKD EPI equation. This calculation has not been validated in all clinical situations. eGFR's persistently <60 mL/min signify possible Chronic Kidney Disease.    Anion gap 8 5 - 15  Protein electrophoresis, serum     Status: Abnormal   Collection Time: 11/12/14  4:08 PM  Result Value Ref Range   Total Protein ELP 5.3 (L) 6.0 - 8.5 g/dL   Albumin ELP 1.9 (L) 3.2 - 5.6 g/dL   Alpha-1-Globulin 0.4 0.1 - 0.4 g/dL   Alpha-2-Globulin 1.2 0.4 - 1.2 g/dL   Beta Globulin 0.9 0.6 - 1.3 g/dL   Gamma Globulin 0.9 0.5 - 1.6 g/dL   M-Spike, % Not Observed Not Observed g/dL   SPE Interp. Comment     Comment: (NOTE) The SPE pattern reflects hypoalbuminemia. Evidence of monoclonal protein is not apparent. Performed At: [facility] [place], [place] [id number] Lindon Romp MD TD:[id number]    Comment Comment     Comment: (NOTE) Protein electrophoresis scan will follow via computer, mail, or courier delivery.    GLOBULIN, TOTAL 3.4 2.0 - 4.5 g/dL   A/G Ratio 0.6 (L) 0.7 - 2.0  Uric acid     Status: None   Collection Time: 11/12/14  4:08 PM  Result Value Ref Range   Uric Acid, Serum 6.6 4.4 - 7.6 mg/dL  C3 complement     Status: None   Collection Time: 11/12/14  4:08 PM  Result Value Ref Range   C3 Complement 120 82 - 167 mg/dL    Comment:  (NOTE) Performed At: [facility] [address] [place], [place] [id number] Lindon Romp MD OE:[id number]   C4 complement     Status: None   Collection Time: 11/12/14  4:08 PM  Result Value Ref Range   Complement C4, Body Fluid 17 14 - 44 mg/dL    Comment: (NOTE) Performed At: [facility] [place], [place] [id number] Lindon Romp MD [id number]   Mpo/pr-3 (anca) antibodies     Status: None   Collection Time: 11/12/14  4:08 PM  Result Value Ref Range   Myeloperoxidase Abs <9.0 0.0 - 9.0 U/mL   ANCA Proteinase 3 <3.5 0.0 - 3.5 U/mL    Comment: (NOTE) Performed At: [facility] [place], [place] [id number] Lindon Romp MD [id number]   Antistreptolysin O titer     Status: None   Collection Time: 11/12/14  4:08 PM  Result Value Ref Range   ASO 40.6 0.0 - 200.0  IU/mL    Comment: (NOTE) Performed At: [facility] [place], [place] [id number] Lindon Romp MD [id number]   HIV antibody     Status: None   Collection Time: 11/12/14  4:08 PM  Result Value Ref Range   HIV Screen 4th Generation wRfx Non Reactive Non Reactive    Comment: (NOTE) Performed At: [facility] [place], [place] [id number] Lindon Romp MD TO:[id number]   Hepatitis B surface antigen     Status: None   Collection Time: 11/12/14  4:08 PM  Result Value Ref Range   Hepatitis B Surface Ag NEGATIVE NEGATIVE    Comment: Performed at [facility]  Hepatitis B surface antibody     Status: Abnormal   Collection Time: 11/12/14  4:08 PM  Result Value Ref Range   Hep B S Ab POSITIVE (A) NEGATIVE    Comment: Performed at [facility]  Hepatitis c antibody (reflex)     Status: Abnormal   Collection Time: 11/12/14  4:08 PM  Result Value Ref Range   HCV Ab Reactive (A) NEGATIVE    Comment: HCV RNA QUANT PENDING  Parathyroid hormone, intact (no Ca)     Status: Abnormal    Collection Time: 11/12/14  4:08 PM  Result Value Ref Range   PTH 123 (H) 15 - 65 pg/mL    Comment: (NOTE) Performed At: [facility] [place], [place] [id number] Lindon Romp MD [id number]   Sodium, urine, random     Status: None   Collection Time: 11/12/14  6:30 PM  Result Value Ref Range   Sodium, Ur 55 mmol/L  Creatinine, urine, random     Status: None   Collection Time: 11/12/14  6:30 PM  Result Value Ref Range   Creatinine, Urine 100.21 mg/dL  Urinalysis, Routine w reflex microscopic     Status: Abnormal   Collection Time: 11/12/14  6:30 PM  Result Value Ref Range   Color, Urine AMBER (A) YELLOW    Comment: BIOCHEMICALS MAY BE AFFECTED BY COLOR   APPearance CLOUDY (A) CLEAR   Specific Gravity, Urine 1.020 1.005 - 1.030   pH 6.0 5.0 - 8.0   Glucose, UA NEGATIVE NEGATIVE mg/dL   Hgb urine dipstick LARGE (A) NEGATIVE   Bilirubin Urine NEGATIVE NEGATIVE   Ketones, ur NEGATIVE NEGATIVE mg/dL   Protein, ur >300 (A) NEGATIVE mg/dL   Urobilinogen, UA 0.2 0.0 - 1.0 mg/dL   Nitrite NEGATIVE NEGATIVE   Leukocytes, UA NEGATIVE NEGATIVE  Urine microscopic-add on     Status: Abnormal   Collection Time: 11/12/14  6:30 PM  Result Value Ref Range   Squamous Epithelial / LPF RARE RARE   WBC, UA 3-6 <3 WBC/hpf   RBC / HPF 11-20 <3 RBC/hpf   Bacteria, UA RARE RARE   Casts HYALINE CASTS (A) NEGATIVE    Comment: GRANULAR CAST  Glucose, capillary     Status: None   Collection Time: 11/12/14  9:20 PM  Result Value Ref Range   Glucose-Capillary 97 65 - 99 mg/dL  Protime-INR     Status: Abnormal   Collection Time: 11/13/14  4:00 AM  Result Value Ref Range   Prothrombin Time 28.1 (H) 11.6 - 15.2 seconds   INR 2.61 (H) 0.00 - 1.49  Glucose, capillary     Status: None   Collection Time: 11/13/14  8:10 AM  Result Value Ref Range   Glucose-Capillary 77 65 - 99 mg/dL  Glucose, capillary     Status: Abnormal   Collection Time: 11/13/14 11:30 AM  Result Value  Ref Range   Glucose-Capillary 107 (H) 65 - 99 mg/dL   Comment 1 Notify RN    Comment 2 Document in Chart   Glucose, capillary     Status: Abnormal   Collection Time: 11/13/14  4:47 PM  Result Value Ref Range   Glucose-Capillary 150 (H) 65 - 99 mg/dL  Glucose, capillary     Status: Abnormal   Collection Time: 11/13/14  9:29 PM  Result Value Ref Range   Glucose-Capillary 157 (H) 65 - 99 mg/dL  Protime-INR     Status: Abnormal   Collection Time: 11/14/14  4:05 AM  Result Value Ref Range   Prothrombin Time 29.5 (H) 11.6 - 15.2 seconds   INR 2.85 (H) 0.00 - 1.49  CBC     Status: Abnormal   Collection Time: 11/14/14  4:05 AM  Result Value Ref Range   WBC 7.5 4.0 - 10.5 K/uL   RBC 3.56 (L) 4.22 - 5.81 MIL/uL   Hemoglobin 10.3 (L) 13.0 - 17.0 g/dL   HCT 30.8 (L) 39.0 - 52.0 %   MCV 86.5 78.0 - 100.0 fL   MCH 28.9 26.0 - 34.0 pg   MCHC 33.4 30.0 - 36.0 g/dL   RDW 14.4 11.5 - 15.5 %   Platelets 238 150 - 400 K/uL  Renal function panel     Status: Abnormal   Collection Time: 11/14/14  4:09 AM  Result Value Ref Range   Sodium 135 135 - 145 mmol/L   Potassium 4.0 3.5 - 5.1 mmol/L   Chloride 102 101 - 111 mmol/L   CO2 24 22 - 32 mmol/L   Glucose, Bld 128 (H) 65 - 99 mg/dL   BUN 31 (H) 6 - 20 mg/dL   Creatinine, Ser 3.85 (H) 0.61 - 1.24 mg/dL   Calcium 8.0 (L) 8.9 - 10.3 mg/dL   Phosphorus 4.9 (H) 2.5 - 4.6 mg/dL   Albumin 1.5 (L) 3.5 - 5.0 g/dL   GFR calc non Af Amer 16 (L) >60 mL/min   GFR calc Af Amer 18 (L) >60 mL/min    Comment: (NOTE) The eGFR has been calculated using the CKD EPI equation. This calculation has not been validated in all clinical situations. eGFR's persistently <60 mL/min signify possible Chronic Kidney Disease.    Anion gap 9 5 - 15  Glucose, capillary     Status: Abnormal   Collection Time: 11/14/14  7:51 AM  Result Value Ref Range   Glucose-Capillary 119 (H) 65 - 99 mg/dL  Glucose, capillary     Status: Abnormal   Collection Time: 11/14/14 11:49 AM   Result Value Ref Range   Glucose-Capillary 123 (H) 65 - 99 mg/dL    Vitals: Blood pressure 122/73, pulse 91, temperature 98.3 F (36.8 C), temperature source Oral, resp. rate 18, height _0  (2.057 m), weight 143.745 kg (316 lb 14.4 oz), SpO2 98 %.  Risk to Self: Is patient at risk for suicide?: No Risk to Others:   Prior Inpatient Therapy:   Prior Outpatient Therapy:    Current Facility-Administered Medications  Medication Dose Route Frequency Provider Last Rate Last Dose  . 0.9 %  sodium chloride infusion   Intravenous Continuous Newt Minion, MD 10 mL/hr at 11/11/14 2231    . alum & mag hydroxide-simeth (MAALOX/MYLANTA) 200-200-20 MG/5ML suspension 30 mL  30 mL Oral Q6H PRN Ivor Costa, MD      .  antiseptic oral rinse (CPC / CETYLPYRIDINIUM CHLORIDE 0.05%) solution 7 mL  7 mL Mouth Rinse q12n4p Belkys A Regalado, MD   7 mL at 11/13/14 1200  . atorvastatin (LIPITOR) tablet 10 mg  10 mg Oral q1800 Wynell Balloon, RPH   10 mg at 11/13/14 1807  . bisacodyl (DULCOLAX) suppository 10 mg  10 mg Rectal Daily PRN Geradine Girt, DO      . calcitRIOL (ROCALTROL) capsule 0.25 mcg  0.25 mcg Oral Daily Mauricia Area, MD   0.25 mcg at 11/14/14 [phone]  . chlorhexidine (PERIDEX) 0.12 % solution 15 mL  15 mL Mouth Rinse BID Belkys A Regalado, MD   15 mL at 11/14/14 0829  . diazepam (VALIUM) tablet 10 mg  10 mg Oral Q8H PRN Belkys A Regalado, MD   10 mg at 11/14/14 0615  . diltiazem (CARDIZEM) tablet 60 mg  60 mg Oral 4 times per day Dorothy Spark, MD   60 mg at 11/14/14 0610  . diphenhydrAMINE (BENADRYL) capsule 50 mg  50 mg Oral Q6H PRN Ambrose Finland, MD   50 mg at 11/14/14 1001  . divalproex (DEPAKOTE) DR tablet 500 mg  500 mg Oral Q12H Ambrose Finland, MD   500 mg at 11/13/14 1549  . ferumoxytol (FERAHEME) 510 mg in sodium chloride 0.9 % 100 mL IVPB  510 mg Intravenous Weekly Mauricia Area, MD   510 mg at 11/13/14 1128  . fluticasone (FLONASE) 50 MCG/ACT nasal spray 1 spray   1 spray Each Nare Daily PRN Ivor Costa, MD      . furosemide (LASIX) tablet 160 mg  160 mg Oral BID Mauricia Area, MD   160 mg at 11/14/14 6644  . hydrALAZINE (APRESOLINE) injection 5 mg  5 mg Intravenous Q2H PRN Ivor Costa, MD      . insulin glargine (LANTUS) injection 22 Units  22 Units Subcutaneous QHS Ivor Costa, MD   22 Units at 11/13/14 2241  . insulin regular (NOVOLIN R,HUMULIN R) 100 units/mL injection 0-5 Units  0-5 Units Subcutaneous QHS Ivor Costa, MD      . insulin regular (NOVOLIN R,HUMULIN R) 100 units/mL injection 0-9 Units  0-9 Units Subcutaneous TID WC Ivor Costa, MD   1 Units at 11/13/14 1630  . levalbuterol (XOPENEX) nebulizer solution 0.63 mg  0.63 mg Nebulization Q6H PRN Rella Larve, MD   0.63 mg at 11/12/14 0948  . levothyroxine (SYNTHROID, LEVOTHROID) tablet 88 mcg  88 mcg Oral QAC breakfast Ivor Costa, MD   88 mcg at 11/14/14 0829  . loratadine (CLARITIN) tablet 10 mg  10 mg Oral Daily PRN Ivor Costa, MD      . methocarbamol (ROBAXIN) tablet 500 mg  500 mg Oral Q6H PRN Newt Minion, MD   500 mg at 11/12/14 2205   Or  . methocarbamol (ROBAXIN) 500 mg in dextrose 5 % 50 mL IVPB  500 mg Intravenous Q6H PRN Newt Minion, MD      . metoCLOPramide (REGLAN) tablet 5-10 mg  5-10 mg Oral Q8H PRN Newt Minion, MD       Or  . metoCLOPramide (REGLAN) injection 5-10 mg  5-10 mg Intravenous Q8H PRN Newt Minion, MD      . metoprolol tartrate (LOPRESSOR) tablet 25 mg  25 mg Oral BID Mauricia Area, MD   25 mg at 11/14/14 0347  . morphine 2 MG/ML injection 1 mg  1 mg Intravenous Q4H PRN Belkys A Regalado, MD   1 mg  at 11/11/14 1151  . multivitamin with minerals tablet 1 tablet  1 tablet Oral q morning - 10a Ivor Costa, MD   1 tablet at 11/14/14 [phone]  . nicotine (NICODERM CQ - dosed in mg/24 hours) patch 21 mg  21 mg Transdermal Q24H Ivor Costa, MD   21 mg at 11/13/14 2242  . ondansetron (ZOFRAN) injection 4 mg  4 mg Intravenous Q6H PRN Newt Minion, MD      . ondansetron [facility]) tablet 4  mg  4 mg Oral Q6H PRN Ivor Costa, MD      . oxyCODONE (Oxy IR/ROXICODONE) immediate release tablet 30 mg  30 mg Oral Q4H PRN Jeryl Columbia, NP   30 mg at 11/14/14 7939  . pantoprazole (PROTONIX) EC tablet 40 mg  40 mg Oral Daily Ivor Costa, MD   40 mg at 11/14/14 1001  . QUEtiapine (SEROQUEL) tablet 50 mg  50 mg Oral QHS Ambrose Finland, MD   50 mg at 11/13/14 2200  . sodium chloride 0.9 % injection 3 mL  3 mL Intravenous Q12H Ivor Costa, MD   3 mL at 11/12/14 2201  . tiotropium (SPIRIVA) inhalation capsule 18 mcg  18 mcg Inhalation Daily Ivor Costa, MD   18 mcg at 11/14/14 0829  . warfarin (COUMADIN) tablet 5 mg  5 mg Oral ONCE-1800 Jessica U Vann, DO      . Warfarin - Pharmacist Dosing Inpatient   Does not apply Q3009 Ivor Costa, MD   0  at 11/11/14 1800    Musculoskeletal: Strength & Muscle Tone: decreased Gait & Station: unable to stand Patient leans: N/A  Psychiatric Specialty Exam: Physical Exam   ROS   Blood pressure 122/73, pulse 91, temperature 98.3 F (36.8 C), temperature source Oral, resp. rate 18, height _0  (2.057 m), weight 143.745 kg (316 lb 14.4 oz), SpO2 98 %.Body mass index is 33.97 kg/(m^2).  General Appearance: Bizarre, Disheveled and Guarded  Eye Contact::  Fair  Speech:  Blocked, Pressured and Slurred  Volume:  Decreased  Mood:  Anxious, Depressed and Irritable  Affect:  Non-Congruent, Inappropriate and Labile  Thought Process:  Circumstantial, Disorganized, Irrelevant and Tangential  Orientation:  Full (Time, Place, and Person)  Thought Content:  Paranoid Ideation and Rumination  Suicidal Thoughts:  No  Homicidal Thoughts:  No  Memory:  Immediate;   Fair Recent;   Poor  Judgement:  Impaired  Insight:  Shallow  Psychomotor Activity:  Restlessness  Concentration:  Poor  Recall:  [place] of Knowledge:Good  Language: Good  Akathisia:  Negative  Handed:  Right  AIMS (if indicated):     Assets:  Communication Skills Desire for  Improvement Financial Resources/Insurance Housing Leisure Time Resilience  ADL's:  Impaired  Cognition: Impaired,  Moderate  Sleep:      Medical Decision Making: Review of Psycho-Social Stressors (1), Review or order clinical lab tests (1), Discuss test with performing physician (1), Established Problem, Worsening (2), Review or order medicine tests (1), Review of Medication Regimen & Side Effects (2) and Review of New Medication or Change in Dosage (2)  Treatment Plan Summary: Patient has multiple symptoms of bipolar, mixed symptoms, noncompliant with medication management and multiple medical problems status post surgery. Patient has limited insight and judgment into his current mental status and needed medication treatment. Will adjust medication as required  Daily contact with patient to assess and evaluate symptoms and progress in treatment and Medication management  Plan: Patient does not meet criteria for  capacity to make his own medical decisions and living arrangements as per my evaluation today  Referred to the psychiatric social service regarding obtaining collateral Information and possible psychosocial history from the family members.  Bipolar mixed: Discontinue Depakote 500 mg twice daily as patient is refusing Increase Seroquel 100 mg at bedtime for insomnia and mood swings  Anxiety and agitation: Continue Valium 10 mg 3 times a day as needed  Itching: Benadryl 50 mg every 6 hours as needed for itching  Patient does not meet criteria for psychiatric inpatient admission. Supportive therapy provided about ongoing stressors.  Disposition: Patient need reevaluation when improves his mental status and also needed outpatient psychiatric services as he does not meet criteria for inpatient psychiatric hospitalization at this time.  Oluwatomisin Hustead,JANARDHAHA R. 11/14/2014 12:11 PM

## 2014-11-14 NOTE — Clinical Social Work Note (Signed)
Clinical Social Work Assessment  Patient Details  Name: Johnny Sims MRN: [id number] Date of Birth: Apr 20, 1956  Date of referral:  11/14/14               Reason for consult:  Discharge Planning                Permission sought to share information with:  Family Supports, Customer service manager Permission granted to share information::  Yes, Verbal Permission Granted  Name::     Metallurgist::     Relationship::     Contact Information:     Housing/Transportation Living arrangements for the past 2 months:  Apartment Source of Information:  Other (Comment Required) (Patient's brother Johnny Sims) Patient Interpreter Needed:  None Criminal Activity/Legal Involvement Pertinent to Current Situation/Hospitalization:  No - Comment as needed Significant Relationships:  Siblings Lives with:  Self Do you feel safe going back to the place where you live?  Yes Need for family participation in patient care:  Yes (Comment)  Care giving concerns:  Patient does agree that he needs to go to SNF at discharge for short term rehab placement for physical therapy.    Social Worker assessment / plan:  CSW consulted for SNF placement. CSW has reviewed patient's chart in detail. Psych has deemed the patient to lack capacity. CSW spoke with patient's brother Johnny Sims by phone. Johnny Sims agrees with recommendation for SNF and states he would be available to make decisions if necessary but could not get out to sign papers for the patient because of his own health issues. CSW met with the patient at bedside. Patient does not seem to be exhibiting the behaviors he has shown earlier this hospitalization. The patient's mentation seems to have improved significantly. He is able to articulate his understanding of his diagnosis and reason for admission. The patient does have a tendency to ramble. Patient was alert and oriented x4 with CSW. Patient is also able to articulate what his DC needs will be. MD plans to have psychiatry  see patient again to determine if patient now has capacity. Lastly, patient has questions about having a sitter, he doesn't think he needs think and feels like he is wasting the sitter's time.   Employment status:  Disabled (Comment on whether or not currently receiving Disability) Insurance information:  Medicaid In [place] PT Recommendations:  [place] / Referral to community resources:  [place]  Patient/Family's Response to care:  Patient states that he is happy with the care he has received. He reports that he does wish the treatment team would explain their roles to him more clearly and go into more detail about his condition.  Patient/Family's Understanding of and Emotional Response to Diagnosis, Current Treatment, and Prognosis:  Patient appears to have good insight into reason for admission.  Emotional Assessment Appearance:  Appears stated age Attitude/Demeanor/Rapport:  Other (Appropriate) Affect (typically observed):  Calm, Stable Orientation:  Oriented to Self, Oriented to Place, Oriented to  Time, Oriented to Situation Alcohol / Substance use:  Illicit Drugs, Other (Hx of cocaine use) Psych involvement (Current and /or in the community):  Yes (Comment) (Psych prescribed meds for BP D/O and ruled that patient does not have capacity.)  Discharge Needs  Concerns to be addressed:  Discharge Planning Concerns Readmission within the last 30 days:  No Current discharge risk:  Lack of support system, Lives alone, Physical Impairment Barriers to Discharge:  Continued Medical Work up   Johnny Noel,  LCSW 11/14/2014, 10:37 AM

## 2014-11-14 NOTE — Progress Notes (Signed)
[facility] PROGRESS NOTE  EMANUAL LAMOUNTAIN RCB:[id number] DOB: September 27, 1955 DOA: 11/10/2014 PCP: Angelica Chessman, MD  Assessment/Plan: Osteomyelitis of left foot:  - Blood cultures x 2 pending.  - prn home oxycodon for pain.  -lactic acid 0.6, pro-calcitonin 0.10, ESR 84, and Crp 4.5  - INR/PTT, type and screen -s/p surgery 5/17- abx d/c'd 5/18   Atrial Fibrillation-RVR: CHA2DS2-VASc Score is 3 (HTN, DM and possible PVD).  -Cardiology consulted -cardizem gtt-wean off and oral cardizem -continue metoprolol  - resume coumadin  Volume overloaded -lasix per nephro - up 70 lbs since last year  Hyperlipidemia: LDL was 33 on 02/19/10 -Continue Zocor  Diabetes mellitus: Patient was on Lantus 28 units daily -decrease Lantus to 22 units daily  - Novolin sliding scale insulin (he is allergic to Novolog)  Hypertension: -Continue amlodipine, Toprol -Hold lisinopril -prn IV hydralazine  CKD-III: Baseline creatinine 1.5-2.5 -hold lisinopril -IV fluids d/c -ask renal to see  -Strict I and O.   Bipolar d/o -meds started by psych -does not have capacity- trying to locate family  COPD: No signs of acute exacerbation. -Continue spirava  Tobacco abuse:  -Counseled about the importance of quitting smoking  -nicotine patch  GERD:  -Protonix  Code Status: Full code.  Family Communication: Care discussed with patient.  Disposition Plan:    Consultants:  Dr Sharol Given, Ortho  Procedures:  none  Antibiotics: Vancomycin 5-16- d/c Zosyn 5-15- d/c    HPI/Subjective: C/o not having a BM in a few days  Objective: Filed Vitals:   11/14/14 0528  BP: 122/73  Pulse: 91  Temp: 98.3 F (36.8 C)  Resp: 18    Intake/Output Summary (Last 24 hours) at 11/14/14 0812 Last data filed at 11/14/14 0500  Gross per 24 hour  Intake    240 ml  Output   3600 ml  Net  -3360 ml   Filed Weights   11/12/14 0500 11/13/14 0500 11/14/14 0524  Weight: 149 kg (328 lb 7.8 oz) 145.1  kg (319 lb 14.2 oz) 143.745 kg (316 lb 14.4 oz)    Exam:   General:  NAD  Cardiovascular: irregular  Respiratory: diminished but no wheezing  Abdomen: BS present, soft, nt  Musculoskeletal: left foot wrapped  Data Reviewed: Basic Metabolic Panel:  Recent Labs Lab 11/10/14 2003 11/11/14 0205 11/12/14 0254 11/12/14 1608 11/14/14 0409  NA 139 139 139 137 135  K 4.8 4.2 4.4 4.9 4.0  CL 109 111 108 106 102  CO2 21* $Remov'22 24 23 24  'TKRwjB$ GLUCOSE 131* 170* 104* 79 128*  BUN 29* 28* 29* 30* 31*  CREATININE 2.89* 2.86* 3.29* 3.55* 3.85*  CALCIUM 7.9* 7.4* 7.8* 7.9* 8.0*  PHOS  --   --   --  5.2* 4.9*   Liver Function Tests:  Recent Labs Lab 11/11/14 0205 11/12/14 1608 11/14/14 0409  AST 18 22  --   ALT 15* 17  --   ALKPHOS 42 45  --   BILITOT 0.4 0.4  --   PROT 4.8* 5.7*  --   ALBUMIN 1.4* 1.7* 1.5*   No results for input(s): LIPASE, AMYLASE in the last 168 hours. No results for input(s): AMMONIA in the last 168 hours. CBC:  Recent Labs Lab 11/10/14 2003 11/11/14 0205 11/12/14 1608 11/14/14 0405  WBC 11.9* 10.5 9.7 7.5  NEUTROABS 8.4*  --  5.5  --   HGB 11.4* 10.2* 10.5* 10.3*  HCT 33.8* 30.5* 32.3* 30.8*  MCV 86.2 86.9 87.8 86.5  PLT 233 215  234 238   Cardiac Enzymes: No results for input(s): CKTOTAL, CKMB, CKMBINDEX, TROPONINI in the last 168 hours. BNP (last 3 results)  Recent Labs  10/15/14 0524 11/05/14 0205 11/11/14 0205  BNP 181.5* 194.8* 169.7*    ProBNP (last 3 results) No results for input(s): PROBNP in the last 8760 hours.  CBG:  Recent Labs Lab 11/13/14 0810 11/13/14 1130 11/13/14 1647 11/13/14 2129 11/14/14 0751  GLUCAP 77 107* 150* 157* 119*    Recent Results (from the past 240 hour(s))  MRSA PCR Screening     Status: None   Collection Time: 11/10/14 11:50 PM  Result Value Ref Range Status   MRSA by PCR NEGATIVE NEGATIVE Final    Comment:        The GeneXpert MRSA Assay (FDA approved for NASAL specimens only), is one  component of a comprehensive MRSA colonization surveillance program. It is not intended to diagnose MRSA infection nor to guide or monitor treatment for MRSA infections.   Culture, blood (routine x 2)     Status: None (Preliminary result)   Collection Time: 11/11/14 12:10 AM  Result Value Ref Range Status   Specimen Description BLOOD LEFT WRIST  Final   Special Requests BOTTLES DRAWN AEROBIC AND ANAEROBIC 10CC EA  Final   Culture   Final           BLOOD CULTURE RECEIVED NO GROWTH TO DATE CULTURE WILL BE HELD FOR 5 DAYS BEFORE ISSUING A FINAL NEGATIVE REPORT Note: Culture results may be compromised due to an excessive volume of blood received in culture bottles. Performed at [facility]    Report Status PENDING  Incomplete  Culture, blood (routine x 2)     Status: None (Preliminary result)   Collection Time: 11/11/14 12:27 AM  Result Value Ref Range Status   Specimen Description BLOOD LEFT ANTECUBITAL  Final   Special Requests BOTTLES DRAWN AEROBIC AND ANAEROBIC 10CC EA  Final   Culture   Final           BLOOD CULTURE RECEIVED NO GROWTH TO DATE CULTURE WILL BE HELD FOR 5 DAYS BEFORE ISSUING A FINAL NEGATIVE REPORT Note: Culture results may be compromised due to an excessive volume of blood received in culture bottles. Performed at [facility]    Report Status PENDING  Incomplete     Studies: Dg Chest 2 View  11/12/2014   CLINICAL DATA:  Congestive heart failure  EXAM: CHEST  2 VIEW  COMPARISON:  Radiograph 10/15/2014  FINDINGS: Stable enlarged cardiac silhouette. No effusion, infiltrate, or pneumothorax. No acute osseous abnormality.  IMPRESSION: Cardiomegaly without acute cardiopulmonary findings. No change from prior.   Electronically Signed   By: Suzy Bouchard M.D.   On: 11/12/2014 16:59   US Renal  11/12/2014   CLINICAL DATA:  Acute kidney injury.  EXAM: RENAL / URINARY TRACT ULTRASOUND COMPLETE  COMPARISON:  CT, 07/23/2014.  FINDINGS: Right Kidney:   Length: 13.9 cm. Increased parenchymal echogenicity. No mass or stone. No hydronephrosis.  Left Kidney:  Length: 14.5 cm. Increased parenchymal echogenicity. No mass or stone. No hydronephrosis.  Bladder:  Appears normal for degree of bladder distention.  IMPRESSION: Findings consistent with medical renal disease with increased renal parenchymal echogenicity. No other abnormality. No hydronephrosis.   Electronically Signed   By: Lajean Manes M.D.   On: 11/12/2014 17:25    Scheduled Meds: . antiseptic oral rinse  7 mL Mouth Rinse q12n4p  . atorvastatin  10 mg Oral q1800  .  chlorhexidine  15 mL Mouth Rinse BID  . cholecalciferol  2,000 Units Oral QPM  . diltiazem  60 mg Oral 4 times per day  . divalproex  500 mg Oral Q12H  . ferumoxytol  510 mg Intravenous Weekly  . furosemide  120 mg Intravenous BID  . insulin glargine  22 Units Subcutaneous QHS  . insulin regular  0-5 Units Subcutaneous QHS  . insulin regular  0-9 Units Subcutaneous TID WC  . levothyroxine  88 mcg Oral QAC breakfast  . magnesium citrate  1 Bottle Oral Once  . metoprolol  50 mg Oral BID  . multivitamin with minerals  1 tablet Oral q morning - 10a  . nicotine  21 mg Transdermal Q24H  . pantoprazole  40 mg Oral Daily  . QUEtiapine  50 mg Oral QHS  . sodium chloride  3 mL Intravenous Q12H  . tiotropium  18 mcg Inhalation Daily  . Warfarin - Pharmacist Dosing Inpatient   Does not apply q1800   Continuous Infusions: . sodium chloride 10 mL/hr at 11/11/14 2231    Principal Problem:   Bipolar 1 disorder, mixed, moderate Active Problems:   Atrial fibrillation with RVR   Diabetes mellitus   Hyperlipidemia   Essential hypertension   Bipolar disease, chronic   Hepatitis C   COPD (chronic obstructive pulmonary disease)   Long term current use of anticoagulant therapy   Tobacco use disorder   Osteomyelitis of left foot   Osteomyelitis of foot, acute   Acute renal failure superimposed on stage 3 chronic kidney disease    GERD (gastroesophageal reflux disease)   Acute on chronic diastolic CHF (congestive heart failure), NYHA class 3    Time spent: 25 minutes.     Johnny Sims  [facility] Pager [phone] 7PM-7AM, please contact night-coverage at [email or web address], password [facility] 11/14/2014, 8:12 AM  LOS: 4 days

## 2014-11-14 NOTE — Clinical Social Work Placement (Signed)
   CLINICAL SOCIAL WORK PLACEMENT  NOTE  Date:  11/14/2014  Patient Details  Name: Johnny Sims MRN: [id number] Date of Birth: 1956-01-27  Clinical Social Work is seeking post-discharge placement for this patient at the [place] level of care (*CSW will initial, date and re-position this form in  chart as items are completed):  Yes   Patient/family provided with [place] Work Department's list of facilities offering this level of care within the geographic area requested by the patient (or if unable, by the patient's family).  Yes   Patient/family informed of their freedom to choose among providers that offer the needed level of care, that participate in Medicare, Medicaid or managed care program needed by the patient, have an available bed and are willing to accept the patient.  Yes   Patient/family informed of [facility]'s ownership interest in [facility] and [facility], as well as of the fact that they are under no obligation to receive care at these facilities.  PASRR submitted to EDS on 11/14/14     PASRR number received on 11/14/14     Existing PASRR number confirmed on       FL2 transmitted to all facilities in geographic area requested by pt/family on       FL2 transmitted to all facilities within larger geographic area on 11/14/14     Patient informed that his/her managed care company has contracts with or will negotiate with certain facilities, including the following:            Patient/family informed of bed offers received.  Patient chooses bed at       Physician recommends and patient chooses bed at      Patient to be transferred to   on  .  Patient to be transferred to facility by       Patient family notified on   of transfer.  Name of family member notified:        PHYSICIAN       Additional Comment:    _______________________________________________ Rigoberto Noel, LCSW 11/14/2014, 10:50 AM

## 2014-11-14 NOTE — Progress Notes (Signed)
PHARMACY NOTE  Pharmacy Consult :  59 y.o. male is currently on Coumadin for history of PAF.   Dosing Wt :  143.7 kg  LABS :  Recent Labs  11/12/14 0254 11/12/14 1608 11/13/14 0400 11/14/14 0405 11/14/14 0409  HGB  --  10.5*  --  10.3*  --   HCT  --  32.3*  --  30.8*  --   PLT  --  234  --  238  --   LABPROT 20.8*  --  28.1* 29.5*  --   INR 1.77*  --  2.61* 2.85*  --   CREATININE 3.29* 3.55*  --   --  3.85*    MEDICATION: Medication PTA: Medication Sig  . amLODipine (NORVASC) 5 MG tablet Take 5 mg by mouth daily.  . cholecalciferol (VITAMIN D) 1000 UNITS tablet Take 2,000 Units by mouth every evening.   Marland Kitchen COUMADIN 10 MG tablet Take 1 tablet (10 mg total) by mouth as directed. (Patient taking differently: Take 10 mg by mouth as directed. Monday, Wednesday, Friday and Saturday - alternating with 7.5mg )  . COUMADIN 7.5 MG tablet Take 1 tablet (7.5 mg total) by mouth as directed. (Patient taking differently: Take 7.5 mg by mouth as directed. Sunday, Tuesday, Thursday - alternating with 10mg )  . diazepam (VALIUM) 10 MG tablet Take 10 mg by mouth 3 (three) times daily. For anxiety  . fluticasone (FLONASE) 50 MCG/ACT nasal spray Place 1 spray into both nostrils daily as needed for allergies.   . furosemide (LASIX) 80 MG tablet Take 1 tablet (80 mg total) by mouth daily.  . furosemide (LASIX) 80 MG tablet Take 40 mg by mouth daily.  . insulin glargine (LANTUS) 100 UNIT/ML injection Inject 28 Units into the skin at bedtime.  Marland Kitchen levothyroxine (SYNTHROID, LEVOTHROID) 88 MCG tablet Take 1 tablet (88 mcg total) by mouth daily.  Marland Kitchen lisinopril (PRINIVIL,ZESTRIL) 40 MG tablet Take 40 mg by mouth every morning.   . metoprolol (LOPRESSOR) 50 MG tablet Take 1 tablet (50 mg total) by mouth daily.  . Multiple Vitamin (MULTIVITAMIN) tablet Take 1 tablet by mouth every morning.   Marland Kitchen omeprazole (PRILOSEC) 40 MG capsule Take 1 capsule (40 mg total) by mouth daily.  Marland Kitchen  oxycodone (ROXICODONE) 30 MG immediate release tablet Take 30 mg by mouth 5 (five) times daily.  . potassium chloride SA (K-DUR,KLOR-CON) 20 MEQ tablet Take 2 tablets (40 mEq total) by mouth daily.  . potassium chloride SA (K-DUR,KLOR-CON) 20 MEQ tablet Take 40 mEq by mouth daily.  . simvastatin (ZOCOR) 20 MG tablet Take 1 tablet (20 mg total) by mouth daily.  Marland Kitchen tiotropium (SPIRIVA) 18 MCG inhalation capsule Place 1 capsule (18 mcg total) into inhaler and inhale daily.  Marland Kitchen loratadine (CLARITIN) 10 MG tablet Take 1 tablet (10 mg total) by mouth daily as needed for allergies. (Patient not taking: Reported on 11/11/2014)   Scheduled:  Scheduled:  . antiseptic oral rinse  7 mL Mouth Rinse q12n4p  . atorvastatin  10 mg Oral q1800  . calcitRIOL  0.25 mcg Oral Daily  . chlorhexidine  15 mL Mouth Rinse BID  . diltiazem  60 mg Oral 4 times per day  . divalproex  500 mg Oral Q12H  . ferumoxytol  510 mg Intravenous Weekly  . furosemide  160 mg Oral BID  . insulin glargine  22 Units Subcutaneous QHS  . insulin regular  0-5 Units Subcutaneous QHS  . insulin regular  0-9 Units Subcutaneous TID WC  .  levothyroxine  88 mcg Oral QAC breakfast  . metoprolol  25 mg Oral BID  . multivitamin with minerals  1 tablet Oral q morning - 10a  . nicotine  21 mg Transdermal Q24H  . pantoprazole  40 mg Oral Daily  . QUEtiapine  50 mg Oral QHS  . sodium chloride  3 mL Intravenous Q12H  . tiotropium  18 mcg Inhalation Daily  . Warfarin - Pharmacist Dosing Inpatient   Does not apply q1800   Infusion[s]: Infusions:  . sodium chloride 10 mL/hr at 11/11/14 2231   Antibiotic[s]: Discontinued  ASSESSMENT :  59 y.o. male is currently on Coumadin for history of atrial fibrillation    Today's INR trending upward.  Noted patient received one-time bolus of Amiodarone yesterday.   INR  2.85.  No evidence of bleeding complications observed.  GOAL :  TARGET INR 2-3   PLAN : Reduce today's Coumadin to 5 mg  today. Daily INR's, CBC. Monitor for bleeding complications.   Follow Platelet counts.  Marthenia Rolling, Pharm.D,  11/14/2014, 10:14 AM

## 2014-11-14 NOTE — Clinical Social Work Note (Signed)
Patient has transferred to 5W, [place] notes that patient has refused SNF placement and previous CSW has signed off.    Liz Beach MSW, [place], [place], [phone]

## 2014-11-14 NOTE — Progress Notes (Signed)
Subjective: Interval History: has complaints never diagnosed with BIPOLAR, denies talking to Dr. Eliseo Squires this am,  Psychiatrist did not tell him anything, did not like being rushed to another room, not resting.  Got diabetes for Nexium/Prilosec. Marland KitchenDenies talking about anemia yest to the day before.  Objective: Vital signs in last 24 hours: Temp:  [98.3 F (36.8 C)-99.1 F (37.3 C)] 98.3 F (36.8 C) (05/20 0528) Pulse Rate:  [91] 91 (05/20 0528) Resp:  [13-18] 18 (05/20 0528) BP: (113-128)/(67-73) 122/73 mmHg (05/20 0528) SpO2:  [97 %-98 %] 98 % (05/20 0528) Weight:  [143.745 kg (316 lb 14.4 oz)] 143.745 kg (316 lb 14.4 oz) (05/20 0524) Weight change: -1.355 kg (-2 lb 15.8 oz)  Intake/Output from previous day: 05/19 0701 - 05/20 0700 In: 360 [P.O.:360] Out: 3600 [Urine:3600] Intake/Output this shift:    General appearance: alert and aggitated, flight of ideas. Resp: diminished breath sounds bilaterally and rales bibasilar Cardio: irregularly irregular rhythm and systolic murmur: holosystolic 2/6, blowing at apex GI: pos bs, mild distension Extremities: edema 4+, bandage L foot  Lab Results:  Recent Labs  11/12/14 1608 11/14/14 0405  WBC 9.7 7.5  HGB 10.5* 10.3*  HCT 32.3* 30.8*  PLT 234 238   BMET:  Recent Labs  11/12/14 1608 11/14/14 0409  NA 137 135  K 4.9 4.0  CL 106 102  CO2 23 24  GLUCOSE 79 128*  BUN 30* 31*  CREATININE 3.55* 3.85*  CALCIUM 7.9* 8.0*    Recent Labs  11/12/14 1608  PTH 123*   Iron Studies:  Recent Labs  11/12/14 1608  IRON 10*  TIBC 189*    Studies/Results: Dg Chest 2 View  11/12/2014   CLINICAL DATA:  Congestive heart failure  EXAM: CHEST  2 VIEW  COMPARISON:  Radiograph 10/15/2014  FINDINGS: Stable enlarged cardiac silhouette. No effusion, infiltrate, or pneumothorax. No acute osseous abnormality.  IMPRESSION: Cardiomegaly without acute cardiopulmonary findings. No change from prior.   Electronically Signed   By: Suzy Bouchard M.D.   On: 11/12/2014 16:59   US Renal  11/12/2014   CLINICAL DATA:  Acute kidney injury.  EXAM: RENAL / URINARY TRACT ULTRASOUND COMPLETE  COMPARISON:  CT, 07/23/2014.  FINDINGS: Right Kidney:  Length: 13.9 cm. Increased parenchymal echogenicity. No mass or stone. No hydronephrosis.  Left Kidney:  Length: 14.5 cm. Increased parenchymal echogenicity. No mass or stone. No hydronephrosis.  Bladder:  Appears normal for degree of bladder distention.  IMPRESSION: Findings consistent with medical renal disease with increased renal parenchymal echogenicity. No other abnormality. No hydronephrosis.   Electronically Signed   By: Lajean Manes M.D.   On: 11/12/2014 17:25    I have reviewed the patient's current medications.  Assessment/Plan: 1 CKD 4 mild worsening.  Need to diurese , minimize other bp meds ie cardiazem and metoprolol.  Edema due to NS and CKD. No indic for iv diuretics.  Baseline Cr 3 reflection dilution.  Expect rise with diuresis 2 Anemia received Fe 3 Afib need to consider chronic Amio 4 Osteo of foot per Ortho, cont AB 5 Hep C 6 HTN not an issue 7 CopD 8 Smoking 9 Psych does not have capacity.   Not candidate for long term mgmt with current status P po Lasix, consider Amio, wound care , Psych f/u.     LOS: 4 days   Jaiyla Granados L 11/14/2014,8:47 AM

## 2014-11-14 NOTE — Progress Notes (Signed)
Pt requesting new nurse. Pt states only medication he wants tonight is Valium and pain medication.  Told pt that his new nurse will be Felicia. Pt is agreement with this and stated okay. Will continue to monitor pt. Ranelle Oyster, RN (charge nurse)

## 2014-11-15 LAB — CBC
HCT: 29.6 % — ABNORMAL LOW (ref 39.0–52.0)
Hemoglobin: 9.7 g/dL — ABNORMAL LOW (ref 13.0–17.0)
MCH: 28.4 pg (ref 26.0–34.0)
MCHC: 32.8 g/dL (ref 30.0–36.0)
MCV: 86.8 fL (ref 78.0–100.0)
PLATELETS: 239 10*3/uL (ref 150–400)
RBC: 3.41 MIL/uL — ABNORMAL LOW (ref 4.22–5.81)
RDW: 14.2 % (ref 11.5–15.5)
WBC: 6.1 10*3/uL (ref 4.0–10.5)

## 2014-11-15 LAB — RENAL FUNCTION PANEL
ALBUMIN: 1.4 g/dL — AB (ref 3.5–5.0)
Anion gap: 7 (ref 5–15)
BUN: 29 mg/dL — AB (ref 6–20)
CHLORIDE: 101 mmol/L (ref 101–111)
CO2: 28 mmol/L (ref 22–32)
Calcium: 8.1 mg/dL — ABNORMAL LOW (ref 8.9–10.3)
Creatinine, Ser: 3.62 mg/dL — ABNORMAL HIGH (ref 0.61–1.24)
GFR calc Af Amer: 20 mL/min — ABNORMAL LOW (ref >60)
GFR calc non Af Amer: 17 mL/min — ABNORMAL LOW (ref >60)
GLUCOSE: 141 mg/dL — AB (ref 65–99)
POTASSIUM: 4 mmol/L (ref 3.5–5.1)
Phosphorus: 4.4 mg/dL (ref 2.5–4.6)
Sodium: 136 mmol/L (ref 135–145)

## 2014-11-15 LAB — GLUCOSE, CAPILLARY
GLUCOSE-CAPILLARY: 107 mg/dL — AB (ref 65–99)
Glucose-Capillary: 149 mg/dL — ABNORMAL HIGH (ref 65–99)
Glucose-Capillary: 112 mg/dL — ABNORMAL HIGH (ref 65–99)
Glucose-Capillary: 132 mg/dL — ABNORMAL HIGH (ref 65–99)

## 2014-11-15 LAB — PROTIME-INR
INR: 2.69 — AB (ref 0.00–1.49)
PROTHROMBIN TIME: 28.2 s — AB (ref 11.6–15.2)

## 2014-11-15 MED ORDER — MAGNESIUM CITRATE PO SOLN
1.0000 | Freq: Once | ORAL | Status: AC
Start: 2014-11-15 — End: 2014-11-15
  Administered 2014-11-15: 1 via ORAL
  Filled 2014-11-15: qty 296

## 2014-11-15 MED ORDER — SENNOSIDES-DOCUSATE SODIUM 8.6-50 MG PO TABS
3.0000 | ORAL_TABLET | Freq: Two times a day (BID) | ORAL | Status: DC
  Administered 2014-11-15 – 2014-11-17 (×4): 3 via ORAL
  Filled 2014-11-15 (×4): qty 3

## 2014-11-15 MED ORDER — WARFARIN SODIUM 7.5 MG PO TABS
7.5000 mg | ORAL_TABLET | ORAL | Status: AC
  Administered 2014-11-16: 7.5 mg via ORAL
  Filled 2014-11-15: qty 1

## 2014-11-15 MED ORDER — WARFARIN SODIUM 10 MG PO TABS
10.0000 mg | ORAL_TABLET | ORAL | Status: AC
  Administered 2014-11-15: 10 mg via ORAL
  Filled 2014-11-15: qty 1

## 2014-11-15 NOTE — Progress Notes (Signed)
PHARMACY NOTE  Pharmacy Consult :  59 y.o. male is currently on Coumadin for history of atrial fibrillation   Dosing Wt :  138 kg  LABS :  Recent Labs  11/12/14 1608 11/13/14 0400 11/14/14 0405 11/14/14 0409 11/15/14 0607  HGB 10.5*  --  10.3*  --  9.7*  HCT 32.3*  --  30.8*  --  29.6*  PLT 234  --  238  --  239  LABPROT  --  28.1* 29.5*  --  28.2*  INR  --  2.61* 2.85*  --  2.69*  CREATININE 3.55*  --   --  3.85* 3.62*    MEDICATION: Scheduled:  Scheduled:  . antiseptic oral rinse  7 mL Mouth Rinse q12n4p  . atorvastatin  10 mg Oral q1800  . calcitRIOL  0.25 mcg Oral Daily  . chlorhexidine  15 mL Mouth Rinse BID  . diltiazem  60 mg Oral 4 times per day  . ferumoxytol  510 mg Intravenous Weekly  . furosemide  160 mg Oral BID  . insulin glargine  22 Units Subcutaneous QHS  . insulin regular  0-5 Units Subcutaneous QHS  . insulin regular  0-9 Units Subcutaneous TID WC  . levothyroxine  88 mcg Oral QAC breakfast  . metoprolol  25 mg Oral BID  . multivitamin with minerals  1 tablet Oral q morning - 10a  . nicotine  21 mg Transdermal Q24H  . pantoprazole  40 mg Oral Daily  . QUEtiapine  100 mg Oral QHS  . senna-docusate  3 tablet Oral BID  . sodium chloride  3 mL Intravenous Q12H  . tiotropium  18 mcg Inhalation Daily  . Warfarin - Pharmacist Dosing Inpatient   Does not apply q1800    ASSESSMENT :  59 y.o. male is currently on chronic Coumadin for atrial fibrillation    Today's INR 2.69.   INR  Within therapeutic window.  No evidence of bleeding complications observed.  GOAL :  TARGET INR 2-3 .  PLAN : 1. Will resume home Coumadin regimen,  Coumadin 10 mg MWFSat, 7.5 mg TTSun. 2. Daily INR's, CBC. Monitor for bleeding complications.   Follow Platelet counts.  Marthenia Rolling,  Pharm.D   11/15/2014,  12:27 PM

## 2014-11-15 NOTE — Progress Notes (Signed)
Subjective: Interval History: has complaints Orthopedist had let him go .  Wants pain meds for Ortho, [facility] has not treated right.  Objective: Vital signs in last 24 hours: Temp:  [98.1 F (36.7 C)-99.7 F (37.6 C)] 99.7 F (37.6 C) (05/21 0524) Pulse Rate:  [79-96] 79 (05/21 0524) Resp:  [18-20] 18 (05/21 0524) BP: (125-144)/(63-86) 136/86 mmHg (05/21 0524) SpO2:  [98 %] 98 % (05/21 0524) Weight:  [138.075 kg (304 lb 6.4 oz)] 138.075 kg (304 lb 6.4 oz) (05/21 0524) Weight change: -5.67 kg (-12 lb 8 oz)  Intake/Output from previous day: 05/20 0701 - 05/21 0700 In: 1080 [P.O.:1080] Out: 2050 [Urine:2050] Intake/Output this shift:    General appearance: alert, uncooperative and flight of ideas Resp: diminished breath sounds bibasilar and rales bibasilar Cardio: irregularly irregular rhythm, S1, S2 normal and systolic murmur: holosystolic 2/6, blowing at apex GI: mod distension Extremities: edema 4+edema, and bandage L foot  Lab Results:  Recent Labs  11/14/14 0405 11/15/14 0607  WBC 7.5 6.1  HGB 10.3* 9.7*  HCT 30.8* 29.6*  PLT 238 239   BMET:  Recent Labs  11/14/14 0409 11/15/14 0607  NA 135 136  K 4.0 4.0  CL 102 101  CO2 24 28  GLUCOSE 128* 141*  BUN 31* 29*  CREATININE 3.85* 3.62*  CALCIUM 8.0* 8.1*    Recent Labs  11/12/14 1608  PTH 123*   Iron Studies:  Recent Labs  11/12/14 1608  IRON 10*  TIBC 189*    Studies/Results: No results found.  I have reviewed the patient's current medications.  Assessment/Plan: 1 CKD 4  Vol xs, diuresing.  Cr stable.  willneed controlled envirnment to improve.  He is not being part or cooperating with program 2 HTN vol , improving 3 DM controlled 4 Osteo of foot 5 Psych major barrier to any treatment 6 Hep C P lasix , follow k, iv Fe, If to cont long term relationship with this patient, he will have to have some institutional control    LOS: 5 days   Ritisha Deitrick L 11/15/2014,9:44  AM

## 2014-11-15 NOTE — Progress Notes (Addendum)
[facility] PROGRESS NOTE  Johnny Sims YKD:[id number] DOB: 1955-11-08 DOA: 11/10/2014 PCP: Angelica Chessman, MD  Assessment/Plan: Osteomyelitis of left foot:  - Blood cultures x 2 pending.  - prn home oxycodon for pain- patient now saying he was not getting this at home as he had no doctor to prescribe  -lactic acid 0.6, pro-calcitonin 0.10, ESR 84, and Crp 4.5  - INR/PTT, type and screen -s/p surgery 5/17- abx d/c'd 5/18  -NWB for 3 weeks- patient refusing despite continued education - placing weight on heel  Atrial Fibrillation-RVR: CHA2DS2-VASc Score is 3 (HTN, DM and possible PVD).  Patient is on Coumadin at home. INR is 1.7 on admission, which is slightly sub therapeutic. Presents with  RVR on admission with HR up to 140-150/min, hemodynamically stable. -Cardiology consulted for pre-op clearance,  -cardizem gtt-wean off and oral cardizem -continue metoprolol  - resume coumadin  Volume overloaded - up 70 lbs since last year  Constipation -PRN meds -may need one of the new agents if not responsive- per patient has been a problem since childhood  Hyperlipidemia: LDL was 33 on 02/19/10 -Continue Zocor  Diabetes mellitus: Patient was on Lantus 28 units daily -decrease Lantus to 22 units daily  - Novolin sliding scale insulin (he is allergic to Novolog)  Hypertension: -Continue amlodipine, Toprol -Hold lisinopril -prn IV hydralazine  CKD-III:  -renal following Cr trending up as expected with diuresis   ?capacity- does not have per psych Bipolar -refusing meds  COPD: No signs of acute exacerbation. -Continue spirava  Tobacco abuse:  -Counseled about the importance of quitting smoking  -nicotine patch  GERD:  -Protonix  Code Status: Full code.  Family Communication: Care discussed with patient.  Disposition Plan: SNF   Consultants:  Dr Sharol Given, Ortho  Procedures:  none  Antibiotics: Vancomycin 5-16 Zosyn 5-15    HPI/Subjective: Threw 2  urinals last PM Refusing meds  Objective: Filed Vitals:   11/15/14 0524  BP: 136/86  Pulse: 79  Temp: 99.7 F (37.6 C)  Resp: 18    Intake/Output Summary (Last 24 hours) at 11/15/14 0759 Last data filed at 11/15/14 0530  Gross per 24 hour  Intake   1080 ml  Output   2050 ml  Net   -970 ml   Filed Weights   11/13/14 0500 11/14/14 0524 11/15/14 0524  Weight: 145.1 kg (319 lb 14.2 oz) 143.745 kg (316 lb 14.4 oz) 138.075 kg (304 lb 6.4 oz)    Exam:   General:  NAD- poor insight  Cardiovascular: irregular  Respiratory: diminished but no wheezing  Abdomen: BS present, soft, nt  Musculoskeletal: left foot wrapped  Data Reviewed: Basic Metabolic Panel:  Recent Labs Lab 11/11/14 0205 11/12/14 0254 11/12/14 1608 11/14/14 0409 11/15/14 0607  NA 139 139 137 135 136  K 4.2 4.4 4.9 4.0 4.0  CL 111 108 106 102 101  CO2 _0 GLUCOSE 170* 104* 79 128* 141*  BUN 28* 29* 30* 31* 29*  CREATININE 2.86* 3.29* 3.55* 3.85* 3.62*  CALCIUM 7.4* 7.8* 7.9* 8.0* 8.1*  PHOS  --   --  5.2* 4.9* 4.4   Liver Function Tests:  Recent Labs Lab 11/11/14 0205 11/12/14 1608 11/14/14 0409 11/15/14 0607  AST 18 22  --   --   ALT 15* 17  --   --   ALKPHOS 42 45  --   --   BILITOT 0.4 0.4  --   --   PROT 4.8* 5.7*  --   --  ALBUMIN 1.4* 1.7* 1.5* 1.4*   No results for input(s): LIPASE, AMYLASE in the last 168 hours. No results for input(s): AMMONIA in the last 168 hours. CBC:  Recent Labs Lab 11/10/14 2003 11/11/14 0205 11/12/14 1608 11/14/14 0405 11/15/14 0607  WBC 11.9* 10.5 9.7 7.5 6.1  NEUTROABS 8.4*  --  5.5  --   --   HGB 11.4* 10.2* 10.5* 10.3* 9.7*  HCT 33.8* 30.5* 32.3* 30.8* 29.6*  MCV 86.2 86.9 87.8 86.5 86.8  PLT 233 215 234 238 239   Cardiac Enzymes: No results for input(s): CKTOTAL, CKMB, CKMBINDEX, TROPONINI in the last 168 hours. BNP (last 3 results)  Recent Labs  10/15/14 0524 11/05/14 0205 11/11/14 0205  BNP 181.5* 194.8* 169.7*     ProBNP (last 3 results) No results for input(s): PROBNP in the last 8760 hours.  CBG:  Recent Labs Lab 11/13/14 2129 11/14/14 0751 11/14/14 1149 11/14/14 1713 11/14/14 2039  GLUCAP 157* 119* 123* 140* 107*    Recent Results (from the past 240 hour(s))  MRSA PCR Screening     Status: None   Collection Time: 11/10/14 11:50 PM  Result Value Ref Range Status   MRSA by PCR NEGATIVE NEGATIVE Final    Comment:        The GeneXpert MRSA Assay (FDA approved for NASAL specimens only), is one component of a comprehensive MRSA colonization surveillance program. It is not intended to diagnose MRSA infection nor to guide or monitor treatment for MRSA infections.   Culture, blood (routine x 2)     Status: None (Preliminary result)   Collection Time: 11/11/14 12:10 AM  Result Value Ref Range Status   Specimen Description BLOOD LEFT WRIST  Final   Special Requests BOTTLES DRAWN AEROBIC AND ANAEROBIC 10CC EA  Final   Culture   Final           BLOOD CULTURE RECEIVED NO GROWTH TO DATE CULTURE WILL BE HELD FOR 5 DAYS BEFORE ISSUING A FINAL NEGATIVE REPORT Note: Culture results may be compromised due to an excessive volume of blood received in culture bottles. Performed at [facility]    Report Status PENDING  Incomplete  Culture, blood (routine x 2)     Status: None (Preliminary result)   Collection Time: 11/11/14 12:27 AM  Result Value Ref Range Status   Specimen Description BLOOD LEFT ANTECUBITAL  Final   Special Requests BOTTLES DRAWN AEROBIC AND ANAEROBIC 10CC EA  Final   Culture   Final           BLOOD CULTURE RECEIVED NO GROWTH TO DATE CULTURE WILL BE HELD FOR 5 DAYS BEFORE ISSUING A FINAL NEGATIVE REPORT Note: Culture results may be compromised due to an excessive volume of blood received in culture bottles. Performed at [facility]    Report Status PENDING  Incomplete     Studies: No results found.  Scheduled Meds: . antiseptic oral rinse  7 mL  Mouth Rinse q12n4p  . atorvastatin  10 mg Oral q1800  . calcitRIOL  0.25 mcg Oral Daily  . chlorhexidine  15 mL Mouth Rinse BID  . diltiazem  60 mg Oral 4 times per day  . ferumoxytol  510 mg Intravenous Weekly  . furosemide  160 mg Oral BID  . insulin glargine  22 Units Subcutaneous QHS  . insulin regular  0-5 Units Subcutaneous QHS  . insulin regular  0-9 Units Subcutaneous TID WC  . levothyroxine  88 mcg Oral QAC breakfast  .  metoprolol  25 mg Oral BID  . multivitamin with minerals  1 tablet Oral q morning - 10a  . nicotine  21 mg Transdermal Q24H  . pantoprazole  40 mg Oral Daily  . QUEtiapine  100 mg Oral QHS  . sodium chloride  3 mL Intravenous Q12H  . tiotropium  18 mcg Inhalation Daily  . Warfarin - Pharmacist Dosing Inpatient   Does not apply q1800   Continuous Infusions: . sodium chloride 10 mL/hr at 11/11/14 2231    Principal Problem:   Bipolar 1 disorder, mixed, moderate Active Problems:   Atrial fibrillation with RVR   Diabetes mellitus   Hyperlipidemia   Essential hypertension   Bipolar disease, chronic   Hepatitis C   COPD (chronic obstructive pulmonary disease)   Long term current use of anticoagulant therapy   Tobacco use disorder   Osteomyelitis of left foot   Osteomyelitis of foot, acute   Acute renal failure superimposed on stage 3 chronic kidney disease   GERD (gastroesophageal reflux disease)   Acute on chronic diastolic CHF (congestive heart failure), NYHA class 3    Time spent: 25 minutes.     Eulogio Bear  [facility] Pager [phone] 7PM-7AM, please contact night-coverage at [email or web address], password [facility] 11/15/2014, 7:59 AM  LOS: 5 days

## 2014-11-16 LAB — PROTIME-INR
INR: 2.7 — ABNORMAL HIGH (ref 0.00–1.49)
Prothrombin Time: 28.3 seconds — ABNORMAL HIGH (ref 11.6–15.2)

## 2014-11-16 LAB — RENAL FUNCTION PANEL
Albumin: 1.6 g/dL — ABNORMAL LOW (ref 3.5–5.0)
Anion gap: 7 (ref 5–15)
BUN: 28 mg/dL — AB (ref 6–20)
CHLORIDE: 98 mmol/L — AB (ref 101–111)
CO2: 29 mmol/L (ref 22–32)
CREATININE: 3.62 mg/dL — AB (ref 0.61–1.24)
Calcium: 8.3 mg/dL — ABNORMAL LOW (ref 8.9–10.3)
GFR calc non Af Amer: 17 mL/min — ABNORMAL LOW (ref >60)
GFR, EST AFRICAN AMERICAN: 20 mL/min — AB (ref >60)
Glucose, Bld: 88 mg/dL (ref 65–99)
Phosphorus: 4.2 mg/dL (ref 2.5–4.6)
Potassium: 4.2 mmol/L (ref 3.5–5.1)
SODIUM: 134 mmol/L — AB (ref 135–145)

## 2014-11-16 LAB — GLUCOSE, CAPILLARY
GLUCOSE-CAPILLARY: 118 mg/dL — AB (ref 65–99)
Glucose-Capillary: 89 mg/dL (ref 65–99)
Glucose-Capillary: 128 mg/dL — ABNORMAL HIGH (ref 65–99)
Glucose-Capillary: 99 mg/dL (ref 65–99)

## 2014-11-16 MED ORDER — DILTIAZEM HCL ER COATED BEADS 180 MG PO CP24
180.0000 mg | ORAL_CAPSULE | Freq: Every day | ORAL | Status: DC
  Filled 2014-11-16: qty 1

## 2014-11-16 NOTE — Progress Notes (Signed)
PHARMACY NOTE  Pharmacy Consult :  59 y.o. male is currently on chronic Coumadin for atrial fibrillation .   Dosing Wt :  143 kg  LABS :  Recent Labs  11/14/14 0405 11/14/14 0409 11/15/14 0607 11/16/14 0542  HGB 10.3*  --  9.7*  --   HCT 30.8*  --  29.6*  --   PLT 238  --  239  --   LABPROT 29.5*  --  28.2* 28.3*  INR 2.85*  --  2.69* 2.70*  CREATININE  --  3.85* 3.62* 3.62*    MEDICATION: Medication PTA: Prescriptions prior to admission  Medication Sig Dispense Refill Last Dose  . amLODipine (NORVASC) 5 MG tablet Take 5 mg by mouth daily.   11/10/2014 at Unknown time  . cholecalciferol (VITAMIN D) 1000 UNITS tablet Take 2,000 Units by mouth every evening.    11/09/2014 at Unknown time  . COUMADIN 10 MG tablet Take 1 tablet (10 mg total) by mouth as directed. (Patient taking differently: Take 10 mg by mouth as directed. Monday, Wednesday, Friday and Saturday - alternating with 7.5mg ) 30 tablet 1 11/08/2014 at 6p  . COUMADIN 7.5 MG tablet Take 1 tablet (7.5 mg total) by mouth as directed. (Patient taking differently: Take 7.5 mg by mouth as directed. Sunday, Tuesday, Thursday - alternating with 10mg ) 30 tablet 1 11/06/2014 at 6p  . diazepam (VALIUM) 10 MG tablet Take 10 mg by mouth 3 (three) times daily. For anxiety   11/10/2014 at Unknown time  . fluticasone (FLONASE) 50 MCG/ACT nasal spray Place 1 spray into both nostrils daily as needed for allergies.    11/09/2014 at Unknown time  . furosemide (LASIX) 80 MG tablet Take 1 tablet (80 mg total) by mouth daily. 10 tablet 0 Past Week at Unknown time  . furosemide (LASIX) 80 MG tablet Take 40 mg by mouth daily.   11/10/2014 at Unknown time  . insulin glargine (LANTUS) 100 UNIT/ML injection Inject 28 Units into the skin at bedtime.   11/09/2014 at Unknown time  . levothyroxine (SYNTHROID, LEVOTHROID) 88 MCG tablet Take 1 tablet (88 mcg total) by mouth daily. 30 tablet 3 11/10/2014 at Unknown time  . lisinopril  (PRINIVIL,ZESTRIL) 40 MG tablet Take 40 mg by mouth every morning.   0 11/10/2014 at Unknown time  . metoprolol (LOPRESSOR) 50 MG tablet Take 1 tablet (50 mg total) by mouth daily. 30 tablet 5 11/10/2014 at 8a  . Multiple Vitamin (MULTIVITAMIN) tablet Take 1 tablet by mouth every morning.    11/10/2014 at Unknown time  . omeprazole (PRILOSEC) 40 MG capsule Take 1 capsule (40 mg total) by mouth daily. 30 capsule 5 11/10/2014 at Unknown time  . oxycodone (ROXICODONE) 30 MG immediate release tablet Take 30 mg by mouth 5 (five) times daily.   11/10/2014 at Unknown time  . potassium chloride SA (K-DUR,KLOR-CON) 20 MEQ tablet Take 2 tablets (40 mEq total) by mouth daily. 20 tablet 0 11/10/2014 at Unknown time  . potassium chloride SA (K-DUR,KLOR-CON) 20 MEQ tablet Take 40 mEq by mouth daily.   11/10/2014 at Unknown time  . simvastatin (ZOCOR) 20 MG tablet Take 1 tablet (20 mg total) by mouth daily. 30 tablet 5 11/09/2014 at Unknown time  . tiotropium (SPIRIVA) 18 MCG inhalation capsule Place 1 capsule (18 mcg total) into inhaler and inhale daily. 30 capsule 0 11/10/2014 at Unknown time  . loratadine (CLARITIN) 10 MG tablet Take 1 tablet (10 mg total) by mouth daily as needed for allergies. (Patient  not taking: Reported on 11/11/2014) 30 tablet 0 Completed Course at Unknown time   Scheduled:  Scheduled:  . antiseptic oral rinse  7 mL Mouth Rinse q12n4p  . atorvastatin  10 mg Oral q1800  . calcitRIOL  0.25 mcg Oral Daily  . chlorhexidine  15 mL Mouth Rinse BID  . [START ON 11/17/2014] diltiazem  180 mg Oral Daily  . ferumoxytol  510 mg Intravenous Weekly  . furosemide  160 mg Oral BID  . insulin glargine  22 Units Subcutaneous QHS  . insulin regular  0-5 Units Subcutaneous QHS  . insulin regular  0-9 Units Subcutaneous TID WC  . levothyroxine  88 mcg Oral QAC breakfast  . metoprolol  25 mg Oral BID  . multivitamin with minerals  1 tablet Oral q morning - 10a  . nicotine  21 mg Transdermal Q24H  .  pantoprazole  40 mg Oral Daily  . QUEtiapine  100 mg Oral QHS  . senna-docusate  3 tablet Oral BID  . sodium chloride  3 mL Intravenous Q12H  . tiotropium  18 mcg Inhalation Daily  . warfarin  7.5 mg Oral Once per day on Sun Tue Thu  . Warfarin - Pharmacist Dosing Inpatient   Does not apply q1800   ASSESSMENT :  59 y.o. male is currently on chronic Coumadin for atrial fibrillation    Today's INR remains within therapeutic window.   INR  2.7  No evidence of bleeding complications observed.  GOAL :  TARGET INR 2-3   PLAN : 1. Continue home Coumadin schedule.  Will receive Coumadin 7.5 mg today. 2. Daily INR's, CBC. Monitor for bleeding complications.   Follow Platelet counts.  Marthenia Rolling,  Pharm.D   11/16/2014,  12:36 PM

## 2014-11-16 NOTE — Progress Notes (Addendum)
[facility] PROGRESS NOTE  Johnny CASSEDY [id number] DOB: Aug 16, 1955 DOA: 11/10/2014 PCP: Angelica Chessman, MD  Johnny Sims is a 59 Sims.o. male with PMH of diabetes mellitus, COPD, asthma, rheumatoid arthritis, hepatitis C, bipolar disorder, history of melanoma, history of pancreatitis, PAF on Coumadin, hypertension, hyperlipidemia, hypothyroidism, who presented with left second toe pain.  He had surgery 5/17.  Patient was seen by psychiatry as deemed not to have capacity with poor insight.  Has been refusing medications for Bipolar as well as not following post op instructions of [facility].  Patient was also found to have nephropathy and is being diuresed per nephrology.  Assessment/Plan: Osteomyelitis of left foot:  - Blood cultures x 2 NGTD.  - prn home oxycodon for pain- patient now saying he was not getting this at home as he had no doctor to prescribe  -lactic acid 0.6, pro-calcitonin 0.10, ESR 84, and Crp 4.5  -s/p surgery 5/17- abx d/c'd 5/18  -NWB for 3 weeks- patient refusing despite continued education - placing weight on heel  Atrial Fibrillation-RVR: CHA2DS2-VASc Score is 3 (HTN, DM and possible PVD).  Patient is on Coumadin at home. INR is 1.7 on admission, which is slightly sub therapeutic. Presents with  RVR on admission with HR up to 140-150/min, hemodynamically stable. -Cardiology consulted for pre-op clearance,  -cardizem gtt-wean off and oral cardizem -continue metoprolol  - resume coumadin  Volume overloaded - up 70 lbs since last year  Constipation -PRN meds -BM 5/21  Hyperlipidemia: LDL was 33 on 02/19/10 -Continue Zocor  Diabetes mellitus: Patient was on Lantus 28 units daily -decrease Lantus to 22 units daily  - Novolin sliding scale insulin (he is allergic to Novolog)  Hypertension: -Continue amlodipine, Toprol -Hold lisinopril -prn IV hydralazine  AKI on CKD-III:  -renal following Cr trending up as expected with diuresis   ?capacity- does not  have per psych Bipolar- seroqeul started -refusing meds- asked psych to come back and see  COPD: No signs of acute exacerbation. -Continue spirava  Tobacco abuse:  -Counseled about the importance of quitting smoking  -nicotine patch  GERD:  -Protonix  Hep C -was supposed to get treatment but is in the hospital   Code Status: Full code.  Family Communication: Care discussed with patient.  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  Objective: Filed Vitals:   11/16/14 0634  BP:   Pulse: 80  Temp:   Resp:     Intake/Output Summary (Last 24 hours) at 11/16/14 6213 Last data filed at 11/16/14 0020  Gross per 24 hour  Intake    480 ml  Output      0 ml  Net    480 ml   Filed Weights   11/14/14 0524 11/15/14 0524 11/16/14 0440  Weight: 143.745 kg (316 lb 14.4 oz) 138.075 kg (304 lb 6.4 oz) 142.7 kg (314 lb 9.5 oz)    Exam:   General:  NAD- poor insight  Cardiovascular: irregular  Respiratory: diminished but no wheezing  Abdomen: BS present, soft, nt  Musculoskeletal: left foot wrapped  Data Reviewed: Basic Metabolic Panel:  Recent Labs Lab 11/12/14  8546 11/12/14 1608 11/14/14 0409 11/15/14 0607 11/16/14 0542  NA 139 137 135 136 134*  K 4.4 4.9 4.0 4.0 4.2  CL 108 106 102 101 98*  CO2 $Re'24 23 24 28 29  'yPo$ GLUCOSE 104* 79 128* 141* 88  BUN 29* 30* 31* 29* 28*  CREATININE 3.29* 3.55* 3.85* 3.62* 3.62*  CALCIUM 7.8* 7.9* 8.0* 8.1* 8.3*  PHOS  --  5.2* 4.9* 4.4 4.2   Liver Function Tests:  Recent Labs Lab 11/11/14 0205 11/12/14 1608 11/14/14 0409 11/15/14 0607 11/16/14 0542  AST 18 22  --   --    --   ALT 15* 17  --   --   --   ALKPHOS 42 45  --   --   --   BILITOT 0.4 0.4  --   --   --   PROT 4.8* 5.7*  --   --   --   ALBUMIN 1.4* 1.7* 1.5* 1.4* 1.6*   No results for input(s): LIPASE, AMYLASE in the last 168 hours. No results for input(s): AMMONIA in the last 168 hours. CBC:  Recent Labs Lab 11/10/14 2003 11/11/14 0205 11/12/14 1608 11/14/14 0405 11/15/14 0607  WBC 11.9* 10.5 9.7 7.5 6.1  NEUTROABS 8.4*  --  5.5  --   --   HGB 11.4* 10.2* 10.5* 10.3* 9.7*  HCT 33.8* 30.5* 32.3* 30.8* 29.6*  MCV 86.2 86.9 87.8 86.5 86.8  PLT 233 215 234 238 239   Cardiac Enzymes: No results for input(s): CKTOTAL, CKMB, CKMBINDEX, TROPONINI in the last 168 hours. BNP (last 3 results)  Recent Labs  10/15/14 0524 11/05/14 0205 11/11/14 0205  BNP 181.5* 194.8* 169.7*    ProBNP (last 3 results) No results for input(s): PROBNP in the last 8760 hours.  CBG:  Recent Labs Lab 11/14/14 2039 11/15/14 0758 11/15/14 1146 11/15/14 1641 11/15/14 2201  GLUCAP 107* 149* 112* 107* 132*    Recent Results (from the past 240 hour(s))  MRSA PCR Screening     Status: None   Collection Time: 11/10/14 11:50 PM  Result Value Ref Range Status   MRSA by PCR NEGATIVE NEGATIVE Final    Comment:        The GeneXpert MRSA Assay (FDA approved for NASAL specimens only), is one component of a comprehensive MRSA colonization surveillance program. It is not intended to diagnose MRSA infection nor to guide or monitor treatment for MRSA infections.   Culture, blood (routine x 2)     Status: None (Preliminary result)   Collection Time: 11/11/14 12:10 AM  Result Value Ref Range Status   Specimen Description BLOOD LEFT WRIST  Final   Special Requests BOTTLES DRAWN AEROBIC AND ANAEROBIC 10CC EA  Final   Culture   Final           BLOOD CULTURE RECEIVED NO GROWTH TO DATE CULTURE WILL BE HELD FOR 5 DAYS BEFORE ISSUING A FINAL NEGATIVE REPORT Note: Culture results may be compromised due to an  excessive volume of blood received in culture bottles. Performed at [facility]    Report Status PENDING  Incomplete  Culture, blood (routine x 2)     Status: None (Preliminary result)   Collection Time: 11/11/14 12:27 AM  Result Value Ref Range Status   Specimen Description BLOOD LEFT ANTECUBITAL  Final   Special Requests BOTTLES DRAWN AEROBIC AND ANAEROBIC 10CC EA  Final   Culture   Final           BLOOD  CULTURE RECEIVED NO GROWTH TO DATE CULTURE WILL BE HELD FOR 5 DAYS BEFORE ISSUING A FINAL NEGATIVE REPORT Note: Culture results may be compromised due to an excessive volume of blood received in culture bottles. Performed at [facility]    Report Status PENDING  Incomplete     Studies: No results found.  Scheduled Meds: . antiseptic oral rinse  7 mL Mouth Rinse q12n4p  . atorvastatin  10 mg Oral q1800  . calcitRIOL  0.25 mcg Oral Daily  . chlorhexidine  15 mL Mouth Rinse BID  . diltiazem  60 mg Oral 4 times per day  . ferumoxytol  510 mg Intravenous Weekly  . furosemide  160 mg Oral BID  . insulin glargine  22 Units Subcutaneous QHS  . insulin regular  0-5 Units Subcutaneous QHS  . insulin regular  0-9 Units Subcutaneous TID WC  . levothyroxine  88 mcg Oral QAC breakfast  . metoprolol  25 mg Oral BID  . multivitamin with minerals  1 tablet Oral q morning - 10a  . nicotine  21 mg Transdermal Q24H  . pantoprazole  40 mg Oral Daily  . QUEtiapine  100 mg Oral QHS  . senna-docusate  3 tablet Oral BID  . sodium chloride  3 mL Intravenous Q12H  . tiotropium  18 mcg Inhalation Daily  . warfarin  7.5 mg Oral Once per day on Sun Tue Thu  . Warfarin - Pharmacist Dosing Inpatient   Does not apply q1800   Continuous Infusions: . sodium chloride 10 mL/hr at 11/11/14 2231    Principal Problem:   Bipolar 1 disorder, mixed, moderate Active Problems:   Atrial fibrillation with RVR   Diabetes mellitus   Hyperlipidemia   Essential hypertension   Bipolar disease,  chronic   Hepatitis C   COPD (chronic obstructive pulmonary disease)   Long term current use of anticoagulant therapy   Tobacco use disorder   Osteomyelitis of left foot   Osteomyelitis of foot, acute   Acute renal failure superimposed on stage 3 chronic kidney disease   GERD (gastroesophageal reflux disease)   Acute on chronic diastolic CHF (congestive heart failure), NYHA class 3    Time spent: 25 minutes.     Eulogio Bear  [facility] Pager [phone] 7PM-7AM, please contact night-coverage at [email or web address], password [facility] 11/16/2014, 8:07 AM  LOS: 6 days

## 2014-11-16 NOTE — Progress Notes (Signed)
    Subjective:  Denies CP or dyspnea   Objective:  Filed Vitals:   11/16/14 0041 11/16/14 0440 11/16/14 0624 11/16/14 0634  BP: 134/90  116/98   Pulse:   52 80  Temp:   98.6 F (37 C)   TempSrc:   Oral   Resp:   18   Height:      Weight:  314 lb 9.5 oz (142.7 kg)    SpO2:   98%     Intake/Output from previous day:  Intake/Output Summary (Last 24 hours) at 11/16/14 1200 Last data filed at 11/16/14 0020  Gross per 24 hour  Intake    480 ml  Output      0 ml  Net    480 ml    Physical Exam: Physical exam: Well-developed well-nourished in no acute distress.  Skin is warm and dry.  HEENT is normal.  Neck is supple.  Chest is clear to auscultation with normal expansion.  Cardiovascular exam is irregular, 2/6 systolic murmur LSB Abdominal exam nontender or distended. No masses palpated. Extremities show 3+ edema. neuro grossly intact    Lab Results: Basic Metabolic Panel:  Recent Labs  11/15/14 0607 11/16/14 0542  NA 136 134*  K 4.0 4.2  CL 101 98*  CO2 28 29  GLUCOSE 141* 88  BUN 29* 28*  CREATININE 3.62* 3.62*  CALCIUM 8.1* 8.3*  PHOS 4.4 4.2   CBC:  Recent Labs  11/14/14 0405 11/15/14 0607  WBC 7.5 6.1  HGB 10.3* 9.7*  HCT 30.8* 29.6*  MCV 86.5 86.8  PLT 238 239     Assessment/Plan:  1 paroxysmal atrial fibrillation-the patient remains in atrial fibrillation. Continue Cardizem (change to CD 180 mg daily) and metoprolol for rate control. CHADS vasc 3. Continue Coumadin. Can consider DCCV in the future if needed. 2 acute on chronic diastolic congestive heart failure-the patient remains volume overloaded. Etiology of this most likely multifactorial. His renal function has declined. His albumin is 1.4. There may be a cardiac contribution from his atrial fibrillation. LV function normal. Continue present dose of lasix. 3 acute on chronic stage IV kidney failure-Renal function needs to be followed closely. 5 tobacco abuse-patient counseled on  discontinuing. 6 hypertension-continue Cardizem and metoprolol. FU Dr Acie Fredrickson following DC  Kirk Ruths 11/16/2014, 12:00 PM

## 2014-11-16 NOTE — Progress Notes (Signed)
Subjective: Interval History: has complaints angry about Ortho, Dr. Eliseo Squires.  Objective: Vital signs in last 24 hours: Temp:  [98.5 F (36.9 C)-98.6 F (37 C)] 98.6 F (37 C) (05/22 0624) Pulse Rate:  [52-80] 80 (05/22 0634) Resp:  [18] 18 (05/22 0624) BP: (116-134)/(90-98) 116/98 mmHg (05/22 0624) SpO2:  [93 %-98 %] 98 % (05/22 0624) Weight:  [142.7 kg (314 lb 9.5 oz)] 142.7 kg (314 lb 9.5 oz) (05/22 0440) Weight change: 4.625 kg (10 lb 3.1 oz)  Intake/Output from previous day: 05/21 0701 - 05/22 0700 In: 480 [P.O.:480] Out: -  Intake/Output this shift:    General appearance: flight of ideas, cannot follow consistent thead of conversation Resp: diminished breath sounds bilaterally and rales bibasilar Cardio: irregularly irregular rhythm GI: soft, non-tender; bowel sounds normal; no masses,  no organomegaly Extremities: bandage L foot, 3-4+ edema  Lab Results:  Recent Labs  11/14/14 0405 11/15/14 0607  WBC 7.5 6.1  HGB 10.3* 9.7*  HCT 30.8* 29.6*  PLT 238 239   BMET:  Recent Labs  11/15/14 0607 11/16/14 0542  NA 136 134*  K 4.0 4.2  CL 101 98*  CO2 28 29  GLUCOSE 141* 88  BUN 29* 28*  CREATININE 3.62* 3.62*  CALCIUM 8.1* 8.3*   No results for input(s): PTH in the last 72 hours. Iron Studies: No results for input(s): IRON, TIBC, TRANSFERRIN, FERRITIN in the last 72 hours.  Studies/Results: No results found.  I have reviewed the patient's current medications.  Assessment/Plan: 1  CKD 4 vol xs, ??? I&O.  Cr stable.  Not candidate for long term f/u with uncontrolled psych status 2 DM 3 anemia 4 HPTH 5 Psych major issue impeding all of care P will s/o at this time.    LOS: 6 days   Mizani Dilday L 11/16/2014,8:58 AM

## 2014-11-17 DIAGNOSIS — M869 Osteomyelitis, unspecified: Secondary | ICD-10-CM

## 2014-11-17 LAB — CULTURE, BLOOD (ROUTINE X 2)
Culture: NO GROWTH
Culture: NO GROWTH

## 2014-11-17 LAB — UIFE/LIGHT CHAINS/TP QN, 24-HR UR
% BETA, URINE: 11.1 %
ALPHA 1 URINE: 8.4 %
Albumin, U: 54.4 %
Alpha 2, Urine: 12.9 %
FREE KAPPA/LAMBDA RATIO: 3.54 (ref 2.04–10.37)
FREE LAMBDA LT CHAINS, UR: 180 mg/L — AB (ref 0.24–6.66)
FREE LT CHN EXCR RATE: 637 mg/L — AB (ref 1.35–24.19)
GAMMA GLOBULIN URINE: 13.1 %
Total Protein, Urine: 895.9 mg/dL

## 2014-11-17 LAB — GLUCOSE, CAPILLARY
GLUCOSE-CAPILLARY: 138 mg/dL — AB (ref 65–99)
Glucose-Capillary: 105 mg/dL — ABNORMAL HIGH (ref 65–99)

## 2014-11-17 LAB — PROTIME-INR
INR: 2.46 — ABNORMAL HIGH (ref 0.00–1.49)
Prothrombin Time: 26.4 seconds — ABNORMAL HIGH (ref 11.6–15.2)

## 2014-11-17 LAB — HCV RNA QUANT
HCV Quantitative Log: 5.72 {Log} — ABNORMAL HIGH (ref <1.18)
HCV Quantitative: 521761 IU/mL — ABNORMAL HIGH (ref <15)

## 2014-11-17 MED ORDER — FUROSEMIDE 80 MG PO TABS: 160.0000 mg | ORAL_TABLET | Freq: Two times a day (BID) | ORAL | Status: DC

## 2014-11-17 MED ORDER — DILTIAZEM HCL ER COATED BEADS 240 MG PO CP24: 240.0000 mg | ORAL_CAPSULE | Freq: Every day | ORAL | Status: DC

## 2014-11-17 MED ORDER — WARFARIN SODIUM 10 MG PO TABS
10.0000 mg | ORAL_TABLET | Freq: Once | ORAL | Status: DC
  Filled 2014-11-17: qty 1

## 2014-11-17 MED ORDER — QUETIAPINE FUMARATE 100 MG PO TABS: 100.0000 mg | ORAL_TABLET | Freq: Every day | ORAL | Status: DC

## 2014-11-17 MED ORDER — DOXYCYCLINE HYCLATE 100 MG PO TABS: 100.0000 mg | ORAL_TABLET | Freq: Two times a day (BID) | ORAL | Status: DC

## 2014-11-17 MED ORDER — CALCITRIOL 0.25 MCG PO CAPS: 0.2500 ug | ORAL_CAPSULE | Freq: Every day | ORAL | Status: DC

## 2014-11-17 MED ORDER — INSULIN GLARGINE 100 UNIT/ML ~~LOC~~ SOLN: 22.0000 [IU] | Freq: Every day | SUBCUTANEOUS | Status: DC

## 2014-11-17 MED ORDER — OXYCODONE HCL 30 MG PO TABS: 30.0000 mg | ORAL_TABLET | Freq: Four times a day (QID) | ORAL | Status: DC | PRN

## 2014-11-17 NOTE — Discharge Summary (Signed)
Physician Discharge Summary  Johnny Sims JKK:[id number] DOB: 08-Mar-1956 DOA: 11/10/2014  PCP: Angelica Chessman, MD  Admit date: 11/10/2014 Discharge date: 11/17/2014  Time spent: greater than 78min  Recommendations for Outpatient Follow-up:  Home with home health services.  Discharge Diagnoses:  Principal Problem:   Bipolar 1 disorder, mixed, moderate Active Problems:   Atrial fibrillation with RVR   Diabetes mellitus   Hyperlipidemia   Essential hypertension   Bipolar disease, chronic   Hepatitis C   COPD (chronic obstructive pulmonary disease)   Long term current use of anticoagulant therapy   Tobacco use disorder   Osteomyelitis of left foot   Osteomyelitis of foot, acute   Acute renal failure superimposed on stage 3 chronic kidney disease   GERD (gastroesophageal reflux disease)   Acute on chronic diastolic CHF (congestive heart failure), NYHA class 3   Discharge Condition: stable  Diet recommendation: diabetic, heart healthy  Filed Weights   11/15/14 0524 11/16/14 0440 11/17/14 0514  Weight: 138.075 kg (304 lb 6.4 oz) 142.7 kg (314 lb 9.5 oz) 143 kg (315 lb 4.1 oz)    History of present illness:  59 y.o. male with PMH of diabetes mellitus, COPD, asthma, rheumatoid arthritis, hepatitis C, bipolar disorder, history of melanoma, history of pancreatitis, PAF on Coumadin, hypertension, hyperlipidemia, hypothyroidism, who presents with left second toe pain  Patient has a chronic diabetic neuropathy with leg pain chronically. Patient reports that he has been having pain over his left second toe for long time. He states he did see Dr. Sharol Given in the office, but states that when he came to see him. Dr. Sharol Given was out of town. He made an appointment with Dr. Percell Miller in [place]. He states his left foot pain and swelling are getting worse. Patient denies fever, chills, headaches, cough, chest pain, SOB, abdominal pain, diarrhea, constipation, dysuria, urgency, frequency, hematuria, skin  rashes. No unilateral weakness, numbness or tingling sensations. No vision change or hearing loss.  In ED, patient was found to have osteomyelitis in second toe by X-Zaccary of left foot . WBC 11.9. temperature 99.5, lactate 0.53. Creatinine up from recent baseline 1.5-2.5 to 2.8. Patient is admitted to inpatient for further evaluation and treatment. Orthopedic surgeon was consulted by ED  Hospital Course:  Osteomyelitis of left foot:  - Blood cultures x 2 NGTD.  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 Patient is on Coumadin at home. INR is 1.7 on admission, which is slightly sub therapeutic. Presents with RVR on admission with HR up to 140-150/min, hemodynamically stable. -Cardiology consulted for pre-op clearance,  -cardizem gtt-wean off and oral cardizem -continue metoprolol  - resume coumadin  Volume overloaded - up 70 lbs since last year. diuresed  Constipation -PRN meds -BM 5/21  Hyperlipidemia: LDL was 33 on 02/19/10 -Continue Zocor  Diabetes mellitus: continue lantus and novolin  Hypertension: -Continue amlodipine, Toprol  AKI on CKD-III:  -renal following Cr trending up as expected with diuresis   Possible history of bipolar disorder: Pt is alert,oriented and has capacity for informed consent today.  COPD: No signs of acute exacerbation.  Tobacco abuse:  -Counseled about the importance of quitting smoking  -nicotine patch  GERD:  -Protonix  Hep C -was supposed to get treatment but is in the  hospital   Code Status: Full code.  Family Communication: Care discussed with patient.  Disposition Plan: SNF   Consultants:  Dr Sharol Given, Ortho  Psych  nephro   procedure  Left foot, second [place]  amputation   Discharge Exam: Filed Vitals:   11/17/14 0514  BP: 141/83  Pulse: 114  Temp: 98.4 F (36.9 C)  Resp: 20    General: a and o. Calm and cooperative Cardiovascular: RRR Respiratory: CTA  Discharge Instructions   Discharge Instructions    Diet - low sodium heart healthy    Complete by:  As directed      Diet Carb Modified    Complete by:  As directed      Discharge instructions    Complete by:  As directed   [place].  KEEP DRESSING CLEAN AND DRY.  NO WEIGHT BEARING TO LEFT FOOT     Non weight bearing    Complete by:  As directed   Laterality:  left  Extremity:  Lower     Walker     Complete by:  As directed           Current Discharge Medication List    START taking these medications   Details  calcitRIOL (ROCALTROL) 0.25 MCG capsule Take 1 capsule (0.25 mcg total) by mouth daily. Qty: 30 capsule, Refills: 0    diltiazem (CARDIZEM CD) 240 MG 24 hr capsule Take 1 capsule (240 mg total) by mouth daily. Qty: 30 capsule, Refills: 0    QUEtiapine (SEROQUEL) 100 MG tablet Take 1 tablet (100 mg total) by mouth at bedtime. For sleep Qty: 30 tablet, Refills: 0      CONTINUE these medications which have CHANGED   Details  furosemide (LASIX) 80 MG tablet Take 2 tablets (160 mg total) by mouth 2 (two) times daily. Qty: 120 tablet, Refills: 0    insulin glargine (LANTUS) 100 UNIT/ML injection Inject 0.22 mLs (22 Units total) into the skin at bedtime. Qty: 10 mL, Refills: 11    oxycodone (ROXICODONE) 30 MG immediate release tablet Take 1 tablet (30 mg total) by mouth every 6 (six) hours as needed for pain. Qty: 20 tablet, Refills: 0      CONTINUE these medications which have NOT CHANGED   Details  !! COUMADIN 10 MG tablet Take 1 tablet (10 mg total) by mouth as directed. Qty: 30 tablet, Refills: 1    !! COUMADIN 7.5 MG tablet Take 1 tablet (7.5 mg total) by mouth as directed. Qty: 30 tablet, Refills: 1    diazepam (VALIUM) 10 MG tablet Take  10 mg by mouth 3 (three) times daily. For anxiety    fluticasone (FLONASE) 50 MCG/ACT nasal spray Place 1 spray into both nostrils daily as needed for allergies.     levothyroxine (SYNTHROID, LEVOTHROID) 88 MCG tablet Take 1 tablet (88 mcg total) by mouth daily. Qty: 30 tablet, Refills: 3   Associated Diagnoses: Thyroid disorder    metoprolol (LOPRESSOR) 50 MG tablet Take 1 tablet (50 mg total) by mouth daily. Qty: 30 tablet, Refills: 5   Associated Diagnoses: Paroxysmal atrial fibrillation    Multiple Vitamin (MULTIVITAMIN) tablet Take 1 tablet by mouth every morning.     omeprazole (PRILOSEC) 40 MG capsule Take 1 capsule (40 mg total) by mouth daily. Qty: 30 capsule, Refills: 5   Associated Diagnoses: Gastroesophageal reflux disease, esophagitis presence not specified    simvastatin (ZOCOR) 20 MG tablet Take 1 tablet (20 mg total) by mouth daily. Qty:  30 tablet, Refills: 5   Associated Diagnoses: HLD (hyperlipidemia)    tiotropium (SPIRIVA) 18 MCG inhalation capsule Place 1 capsule (18 mcg total) into inhaler and inhale daily. Qty: 30 capsule, Refills: 0     !! - Potential duplicate medications found. Please discuss with provider.    STOP taking these medications     amLODipine (NORVASC) 5 MG tablet      cholecalciferol (VITAMIN D) 1000 UNITS tablet      lisinopril (PRINIVIL,ZESTRIL) 40 MG tablet      potassium chloride SA (K-DUR,KLOR-CON) 20 MEQ tablet      potassium chloride SA (K-DUR,KLOR-CON) 20 MEQ tablet      loratadine (CLARITIN) 10 MG tablet        Allergies  Allergen Reactions  . Antihistamines, Chlorpheniramine-Type Other (See Comments)    unknown  . Iodinated Diagnostic Agents Other (See Comments)    Patient states he doesn't know what iodine is and doesn't think he is allergic to it despite it being listed with his allergies  . Lidocaine Itching    Patient is uncertain of this allergy  . Methadone Other (See Comments)    "I won't take it anymore"  .  Novolog [Insulin Aspart]     "I'm just highly allergic"  . Other Hives    Steroid creams  . Pentazocine Lactate Other (See Comments)    unknown  . Sulfa Antibiotics Itching and Other (See Comments)    whelps  . Vicodin [Hydrocodone-Acetaminophen] Palpitations and Other (See Comments)    Doesn't want to take   Follow-up Information    Follow up with DUDA,MARCUS V, MD In 2 weeks.   Specialty:  Orthopedic Surgery   Contact information:   [place] [address] [phone]       Follow up with [facility] On 11/28/2014.   Why:  2:00. If you cannot keep your appointment, please call and reschedule. Please arrive 15 minutes prior to your appointment.   Contact information:   [address] [place]  [id number] [phone]      Follow up with Nahser, Wonda Cheng, MD In 3 weeks.   Specialty:  Cardiology   Contact information:   [place] [address] [phone]        The results of significant diagnostics from this hospitalization (including imaging, microbiology, ancillary and laboratory) are listed below for reference.    Significant Diagnostic Studies: Dg Chest 2 View  11/12/2014   CLINICAL DATA:  Congestive heart failure  EXAM: CHEST  2 VIEW  COMPARISON:  Radiograph 10/15/2014  FINDINGS: Stable enlarged cardiac silhouette. No effusion, infiltrate, or pneumothorax. No acute osseous abnormality.  IMPRESSION: Cardiomegaly without acute cardiopulmonary findings. No change from prior.   Electronically Signed   By: Suzy Bouchard M.D.   On: 11/12/2014 16:59   US Renal  11/12/2014   CLINICAL DATA:  Acute kidney injury.  EXAM: RENAL / URINARY TRACT ULTRASOUND COMPLETE  COMPARISON:  CT, 07/23/2014.  FINDINGS: Right Kidney:  Length: 13.9 cm. Increased parenchymal echogenicity. No mass or stone. No hydronephrosis.  Left Kidney:  Length: 14.5 cm. Increased parenchymal echogenicity. No mass  or stone. No hydronephrosis.  Bladder:  Appears normal for degree of bladder distention.  IMPRESSION: Findings consistent with medical renal disease with increased renal parenchymal echogenicity. No other abnormality. No hydronephrosis.   Electronically Signed   By: Lajean Manes M.D.   On: 11/12/2014 17:25   Dg  Foot Complete Left  11/10/2014   CLINICAL DATA:  LEFT leg edema and swelling. Second toe swelling and color changes.  EXAM: LEFT FOOT - COMPLETE 3+ VIEW  COMPARISON:  None.  FINDINGS: There is diffuse soft tissue swelling around the second toe. The toe is flexed and the terminal phalanx is suboptimally visualized however there appears to be osteolysis of the terminal phalanx. The terminal phalanx is small and the appearance is compatible with osteomyelitis, particularly with gas in the adjacent soft tissues. The other toes appear within normal limits. There is no proximal tracking of gas in the foot.  IMPRESSION: Second toe osteomyelitis with adjacent soft tissue swelling and gas in the distal second toe.   Electronically Signed   By: Dereck Ligas M.D.   On: 11/10/2014 19:30    Microbiology: Recent Results (from the past 240 hour(s))  MRSA PCR Screening     Status: None   Collection Time: 11/10/14 11:50 PM  Result Value Ref Range Status   MRSA by PCR NEGATIVE NEGATIVE Final    Comment:        The GeneXpert MRSA Assay (FDA approved for NASAL specimens only), is one component of a comprehensive MRSA colonization surveillance program. It is not intended to diagnose MRSA infection nor to guide or monitor treatment for MRSA infections.   Culture, blood (routine x 2)     Status: None   Collection Time: 11/11/14 12:10 AM  Result Value Ref Range Status   Specimen Description BLOOD LEFT WRIST  Final   Special Requests BOTTLES DRAWN AEROBIC AND ANAEROBIC 10CC EA  Final   Culture   Final    NO GROWTH 5 DAYS Note: Culture results may be compromised due to an excessive volume of blood  received in culture bottles. Performed at [facility]    Report Status 11/17/2014 FINAL  Final  Culture, blood (routine x 2)     Status: None   Collection Time: 11/11/14 12:27 AM  Result Value Ref Range Status   Specimen Description BLOOD LEFT ANTECUBITAL  Final   Special Requests BOTTLES DRAWN AEROBIC AND ANAEROBIC 10CC EA  Final   Culture   Final    NO GROWTH 5 DAYS Note: Culture results may be compromised due to an excessive volume of blood received in culture bottles. Performed at [facility]    Report Status 11/17/2014 FINAL  Final     Labs: Basic Metabolic Panel:  Recent Labs Lab 11/12/14 0254 11/12/14 1608 11/14/14 0409 11/15/14 0607 11/16/14 0542  NA 139 137 135 136 134*  K 4.4 4.9 4.0 4.0 4.2  CL 108 106 102 101 98*  CO2 $Re'24 23 24 28 29  'vil$ GLUCOSE 104* 79 128* 141* 88  BUN 29* 30* 31* 29* 28*  CREATININE 3.29* 3.55* 3.85* 3.62* 3.62*  CALCIUM 7.8* 7.9* 8.0* 8.1* 8.3*  PHOS  --  5.2* 4.9* 4.4 4.2   Liver Function Tests:  Recent Labs Lab 11/11/14 0205 11/12/14 1608 11/14/14 0409 11/15/14 0607 11/16/14 0542  AST 18 22  --   --   --   ALT 15* 17  --   --   --   ALKPHOS 42 45  --   --   --   BILITOT 0.4 0.4  --   --   --   PROT 4.8* 5.7*  --   --   --   ALBUMIN 1.4* 1.7* 1.5* 1.4* 1.6*   No results for input(s): LIPASE, AMYLASE in the last 168  hours. No results for input(s): AMMONIA in the last 168 hours. CBC:  Recent Labs Lab 11/10/14 2003 11/11/14 0205 11/12/14 1608 11/14/14 0405 11/15/14 0607  WBC 11.9* 10.5 9.7 7.5 6.1  NEUTROABS 8.4*  --  5.5  --   --   HGB 11.4* 10.2* 10.5* 10.3* 9.7*  HCT 33.8* 30.5* 32.3* 30.8* 29.6*  MCV 86.2 86.9 87.8 86.5 86.8  PLT 233 215 234 238 239   Cardiac Enzymes: No results for input(s): CKTOTAL, CKMB, CKMBINDEX, TROPONINI in the last 168 hours. BNP: BNP (last 3 results)  Recent Labs  10/15/14 0524 11/05/14 0205 11/11/14 0205  BNP 181.5* 194.8* 169.7*    ProBNP (last 3  results) No results for input(s): PROBNP in the last 8760 hours.  CBG:  Recent Labs Lab 11/16/14 1209 11/16/14 1723 11/16/14 2106 11/17/14 0743 11/17/14 1211  GLUCAP 128* 99 118* 105* 138*       Signed:  Ann-Marie Kluge L  [facility] 11/17/2014, 12:46 PM

## 2014-11-17 NOTE — Progress Notes (Signed)
Nsg Discharge Note  Admit Date:  11/10/2014 Discharge date: 11/17/2014   Johnny Sims to be D/C'd Home with home health per MD order.  AVS completed.  Copy for chart, and copy for patient signed, and dated. Patient/caregiver able to verbalize understanding.  Discharge Medication:   Medication List    STOP taking these medications        amLODipine 5 MG tablet  Commonly known as:  NORVASC     cholecalciferol 1000 UNITS tablet  Commonly known as:  VITAMIN D     lisinopril 40 MG tablet  Commonly known as:  PRINIVIL,ZESTRIL     loratadine 10 MG tablet  Commonly known as:  CLARITIN     potassium chloride SA 20 MEQ tablet  Commonly known as:  K-DUR,KLOR-CON      TAKE these medications        calcitRIOL 0.25 MCG capsule  Commonly known as:  ROCALTROL  Take 1 capsule (0.25 mcg total) by mouth daily.     COUMADIN 7.5 MG tablet  Generic drug:  warfarin  Take 1 tablet (7.5 mg total) by mouth as directed.     COUMADIN 10 MG tablet  Generic drug:  warfarin  Take 1 tablet (10 mg total) by mouth as directed.     diazepam 10 MG tablet  Commonly known as:  VALIUM  Take 10 mg by mouth 3 (three) times daily. For anxiety     diltiazem 240 MG 24 hr capsule  Commonly known as:  CARDIZEM CD  Take 1 capsule (240 mg total) by mouth daily.  Start taking on:  11/18/2014     doxycycline 100 MG tablet  Commonly known as:  VIBRA-TABS  Take 1 tablet (100 mg total) by mouth 2 (two) times daily.     fluticasone 50 MCG/ACT nasal spray  Commonly known as:  FLONASE  Place 1 spray into both nostrils daily as needed for allergies.     furosemide 80 MG tablet  Commonly known as:  LASIX  Take 2 tablets (160 mg total) by mouth 2 (two) times daily.     insulin glargine 100 UNIT/ML injection  Commonly known as:  LANTUS  Inject 0.22 mLs (22 Units total) into the skin at bedtime.     levothyroxine 88 MCG tablet  Commonly known as:  SYNTHROID, LEVOTHROID  Take 1 tablet (88 mcg total) by mouth  daily.     metoprolol 50 MG tablet  Commonly known as:  LOPRESSOR  Take 1 tablet (50 mg total) by mouth daily.     multivitamin tablet  Take 1 tablet by mouth every morning.     omeprazole 40 MG capsule  Commonly known as:  PRILOSEC  Take 1 capsule (40 mg total) by mouth daily.     oxycodone 30 MG immediate release tablet  Commonly known as:  ROXICODONE  Take 1 tablet (30 mg total) by mouth every 6 (six) hours as needed for pain.     QUEtiapine 100 MG tablet  Commonly known as:  SEROQUEL  Take 1 tablet (100 mg total) by mouth at bedtime. For sleep     simvastatin 20 MG tablet  Commonly known as:  ZOCOR  Take 1 tablet (20 mg total) by mouth daily.     tiotropium 18 MCG inhalation capsule  Commonly known as:  SPIRIVA  Place 1 capsule (18 mcg total) into inhaler and inhale daily.        Discharge Assessment: Filed Vitals:   11/17/14 6222  BP: 141/83  Pulse: 114  Temp: 98.4 F (36.9 C)  Resp: 20   Skin clean, dry and intact without evidence of skin break down, no evidence of skin tears noted. IV catheter discontinued intact. Site without signs and symptoms of complications - no redness or edema noted at insertion site, patient denies c/o pain - only slight tenderness at site.  Dressing with slight pressure applied.  D/c Instructions-Education: Discharge instructions given to patient/family with verbalized understanding. D/c education completed with patient/family including follow up instructions, medication list, d/c activities limitations if indicated, with other d/c instructions as indicated by MD - patient able to verbalize understanding, all questions fully answered. Patient instructed to return to ED, call 911, or call MD for any changes in condition.  Patient escorted via [place], and D/C home via private auto. Pt. Received his walker and refused BSC.   Dayle Points, RN 11/17/2014 2:29 PM

## 2014-11-17 NOTE — Progress Notes (Addendum)
Dr. Conley Canal stated that patient could discharge to home with home health. CM requested [facility] through [facility], approved. Mary with [facility] notified of [facility] RN and SW and INR due Friday. Pt follows at [facility]. List of accepting SNF passed on to [facility] in case info would be valuable to SW. Follow up appointment scheduled.

## 2014-11-17 NOTE — Progress Notes (Signed)
Patient given pamphlet on [facility]. Patient agreed to follow up with [facility] if not discharged to SNF as recommended by PT. Will follow for disposition and schedule appointment as necessary.

## 2014-11-17 NOTE — Progress Notes (Signed)
Utilization Review completed. Nika Yazzie RN BSN CM 

## 2014-11-17 NOTE — Clinical Social Work Note (Signed)
Patient will be discharged home. CSW signing off at this time.  Liz Beach MSW, [place], [place], [phone]

## 2014-11-17 NOTE — Clinical Social Work Psych Note (Signed)
Psych CSW received consult to obtain collateral information for patient.  Psych CSW left message for patient's brother, [name], [phone].  Psych CSW awaiting a return call.  Nonnie Done, LCSW [phone]  Psychiatric & Orthopedics (5N 1-8) Clinical Social Worker

## 2014-11-17 NOTE — Progress Notes (Signed)
Patient Name: Johnny Sims Date of Encounter: 11/17/2014  Principal Problem:   Bipolar 1 disorder, mixed, moderate Active Problems:   Atrial fibrillation with RVR   Diabetes mellitus   Hyperlipidemia   Essential hypertension   Bipolar disease, chronic   Hepatitis C   COPD (chronic obstructive pulmonary disease)   Long term current use of anticoagulant therapy   Tobacco use disorder   Osteomyelitis of left foot   Osteomyelitis of foot, acute   Acute renal failure superimposed on stage 3 chronic kidney disease   GERD (gastroesophageal reflux disease)   Acute on chronic diastolic CHF (congestive heart failure), NYHA class 3    SUBJECTIVE  Denies CP, palpitation of sob. Feels better.   CURRENT MEDS . antiseptic oral rinse  7 mL Mouth Rinse q12n4p  . atorvastatin  10 mg Oral q1800  . calcitRIOL  0.25 mcg Oral Daily  . chlorhexidine  15 mL Mouth Rinse BID  . diltiazem  180 mg Oral Daily  . ferumoxytol  510 mg Intravenous Weekly  . furosemide  160 mg Oral BID  . insulin glargine  22 Units Subcutaneous QHS  . insulin regular  0-5 Units Subcutaneous QHS  . insulin regular  0-9 Units Subcutaneous TID WC  . levothyroxine  88 mcg Oral QAC breakfast  . metoprolol  25 mg Oral BID  . multivitamin with minerals  1 tablet Oral q morning - 10a  . nicotine  21 mg Transdermal Q24H  . pantoprazole  40 mg Oral Daily  . QUEtiapine  100 mg Oral QHS  . senna-docusate  3 tablet Oral BID  . sodium chloride  3 mL Intravenous Q12H  . tiotropium  18 mcg Inhalation Daily  . warfarin  10 mg Oral ONCE-1800  . Warfarin - Pharmacist Dosing Inpatient   Does not apply q1800    OBJECTIVE  Filed Vitals:   11/16/14 0634 11/16/14 1536 11/16/14 2138 11/17/14 0514  BP:  116/88 147/73 141/83  Pulse: 80 44 116 114  Temp:  99.3 F (37.4 C) 98.9 F (37.2 C) 98.4 F (36.9 C)  TempSrc:  Oral Oral Oral  Resp:  18 20 20   Height:      Weight:    315 lb 4.1 oz (143 kg)  SpO2:  98% 99% 95%   No intake  or output data in the 24 hours ending 11/17/14 0951 Filed Weights   11/15/14 0524 11/16/14 0440 11/17/14 0514  Weight: 304 lb 6.4 oz (138.075 kg) 314 lb 9.5 oz (142.7 kg) 315 lb 4.1 oz (143 kg)    PHYSICAL EXAM  General: Pleasant, NAD. Neuro: Alert and oriented X 3. Moves all extremities spontaneously. Psych: Normal affect. HEENT:  Normal  Neck: Supple without bruits or JVD. Lungs:  Resp regular and unlabored, CTA. Heart: irregular with tachycardia. no s3, s4. systolic  murmurs. Abdomen: Soft, non-tender, non-distended, BS + x 4.  Extremities: No clubbing, cyanosis.  DP/PT/Radials 2+ and equal bilaterally. 3 + LE edema bilaterally.  Accessory Clinical Findings  CBC  Recent Labs  11/15/14 0607  WBC 6.1  HGB 9.7*  HCT 29.6*  MCV 86.8  PLT 734   Basic Metabolic Panel  Recent Labs  11/15/14 0607 11/16/14 0542  NA 136 134*  K 4.0 4.2  CL 101 98*  CO2 28 29  GLUCOSE 141* 88  BUN 29* 28*  CREATININE 3.62* 3.62*  CALCIUM 8.1* 8.3*  PHOS 4.4 4.2   Liver Function Tests  Recent Labs  11/15/14 1937 11/16/14  0542  ALBUMIN 1.4* 1.6*      Radiology/Studies  Dg Chest 2 View  11/12/2014   CLINICAL DATA:  Congestive heart failure  EXAM: CHEST  2 VIEW  COMPARISON:  Radiograph 10/15/2014  FINDINGS: Stable enlarged cardiac silhouette. No effusion, infiltrate, or pneumothorax. No acute osseous abnormality.  IMPRESSION: Cardiomegaly without acute cardiopulmonary findings. No change from prior.   Electronically Signed   By: Suzy Bouchard M.D.   On: 11/12/2014 16:59   US Renal  11/12/2014   CLINICAL DATA:  Acute kidney injury.  EXAM: RENAL / URINARY TRACT ULTRASOUND COMPLETE  COMPARISON:  CT, 07/23/2014.  FINDINGS: Right Kidney:  Length: 13.9 cm. Increased parenchymal echogenicity. No mass or stone. No hydronephrosis.  Left Kidney:  Length: 14.5 cm. Increased parenchymal echogenicity. No mass or stone. No hydronephrosis.  Bladder:  Appears normal for degree of bladder  distention.  IMPRESSION: Findings consistent with medical renal disease with increased renal parenchymal echogenicity. No other abnormality. No hydronephrosis.   Electronically Signed   By: Lajean Manes M.D.   On: 11/12/2014 17:25   Dg Foot Complete Left  11/10/2014   CLINICAL DATA:  LEFT leg edema and swelling. Second toe swelling and color changes.  EXAM: LEFT FOOT - COMPLETE 3+ VIEW  COMPARISON:  None.  FINDINGS: There is diffuse soft tissue swelling around the second toe. The toe is flexed and the terminal phalanx is suboptimally visualized however there appears to be osteolysis of the terminal phalanx. The terminal phalanx is small and the appearance is compatible with osteomyelitis, particularly with gas in the adjacent soft tissues. The other toes appear within normal limits. There is no proximal tracking of gas in the foot.  IMPRESSION: Second toe osteomyelitis with adjacent soft tissue swelling and gas in the distal second toe.   Electronically Signed   By: Dereck Ligas M.D.   On: 11/10/2014 19:30    ASSESSMENT AND PLAN   1 Paroxysmal atrial fibrillation -The patient remains in atrial fibrillation. Discontinued Cardizem gtt now on Cardizem CD 180 mg daily and metoprolol 25mg  BID for rate control. Pulse in 100s. May need to increase dose. However his pulse was in 40-50s yesterday. MD to decide. CHADSVASc Score is 3 (HTN, DM and possible PVD). Continue Coumadin. Can consider DCCV in the future if needed.  2 Acute on chronic diastolic congestive heart failure -The patient remains volume overloaded. Etiology of this most likely multifactorial. His renal function has declined, creatinine 3.62 today. His albumin is 1.4. There may be a cardiac contribution from his atrial fibrillation. LV function normal. Continue Lasix 160mg  po BID.   3 Acute on chronic stage IV kidney failure -Renal function needs to be followed closely. Nephrology is following.   5 Tobacco abuse -patient counseled on  discontinuing.  6 Hypertension -continue Cardizem and metoprolol. BP is relatively stable.   Dispo: Plan to follow with [facility] following DC  Signed, Bhagat,Bhavinkumar PA-C  Pager [phone] As above, patient seen and examined. No chest pain or dyspnea. Heart rate is mildly elevated. Continue metoprolol. Increase Cardizem CD to 240 mg daily. Continue Coumadin. Patient will follow-up with Dr. Acie Fredrickson following DC. Can consider cardioversion once INR therapeutic for 3 consecutive weeks. Patient remains somewhat volume overloaded which is most likely related to worsening renal function, low albumin and possible contribution from atrial fibrillation. Continue Lasix at present dose. He will need to follow-up with nephrology after discharge. Cardiology will sign off. Please call with questions. Kirk Ruths

## 2014-11-17 NOTE — Progress Notes (Signed)
jermaine with [facility] notified of need for walker and 3 in 1. He will deliver to [facility] prior to discharge.

## 2014-11-17 NOTE — Progress Notes (Signed)
PHARMACY NOTE - Coumadin   Pharmacy Consult :  59 y.o. male is currently on Coumadin for history of atrial fibrillation   Dosing Wt :  138 kg  LABS :  Recent Labs  11/15/14 0607 11/16/14 0542 11/17/14 0525  HGB 9.7*  --   --   HCT 29.6*  --   --   PLT 239  --   --   LABPROT 28.2* 28.3* 26.4*  INR 2.69* 2.70* 2.46*  CREATININE 3.62* 3.62*  --     MEDICATION: Scheduled:  Scheduled:  . antiseptic oral rinse  7 mL Mouth Rinse q12n4p  . atorvastatin  10 mg Oral q1800  . calcitRIOL  0.25 mcg Oral Daily  . chlorhexidine  15 mL Mouth Rinse BID  . diltiazem  180 mg Oral Daily  . ferumoxytol  510 mg Intravenous Weekly  . furosemide  160 mg Oral BID  . insulin glargine  22 Units Subcutaneous QHS  . insulin regular  0-5 Units Subcutaneous QHS  . insulin regular  0-9 Units Subcutaneous TID WC  . levothyroxine  88 mcg Oral QAC breakfast  . metoprolol  25 mg Oral BID  . multivitamin with minerals  1 tablet Oral q morning - 10a  . nicotine  21 mg Transdermal Q24H  . pantoprazole  40 mg Oral Daily  . QUEtiapine  100 mg Oral QHS  . senna-docusate  3 tablet Oral BID  . sodium chloride  3 mL Intravenous Q12H  . tiotropium  18 mcg Inhalation Daily  . Warfarin - Pharmacist Dosing Inpatient   Does not apply q1800    ASSESSMENT :  59 y.o. male is currently on chronic Coumadin for atrial fibrillation    Today's INR 2.46.   INR  Within therapeutic window.  No evidence of bleeding complications observed.  GOAL :  TARGET INR 2-3 .  PLAN : 1. Will resume home Coumadin regimen,  Coumadin 10 mg MWFSat, 7.5 mg TTSun. 2. Daily INR's, CBC. Monitor for bleeding complications.   Follow Platelet counts.  Anette Guarneri, PharmD   11/17/2014,  8:40 AM

## 2014-11-19 ENCOUNTER — Telehealth: Payer: Self-pay | Admitting: Internal Medicine

## 2014-11-19 ENCOUNTER — Telehealth: Payer: Self-pay | Admitting: *Deleted

## 2014-11-19 NOTE — Telephone Encounter (Signed)
Nurse from [facility] called to stating that the patients heart rate has been above 100 for the past 2 days along with vomiting. She states that the patient is non compliant with taking his medications. She also needs:  1. An order to see the patient 3 times a week for several weeks 2. An order for a glucometer so the patient can check his blood sugars.   Please f/u with pt.

## 2014-11-19 NOTE — Telephone Encounter (Signed)
Returned patient call Patient states he was d/c from hospital yesterday s/p toe amputation Has HFU appt scheduled with NP on 11/28/14 Requesting referral to [facility] Also, requesting refill on valium 10 mg tid. States he has been on this med for 12 years for anxiety and panic attacks. Last received from [facility] Physician who is no longer in practice

## 2014-11-20 NOTE — Telephone Encounter (Signed)
This is no longer my patient. Please address with [place]. Please read my last note

## 2014-11-21 ENCOUNTER — Encounter: Payer: Self-pay | Admitting: Nurse Practitioner

## 2014-11-21 ENCOUNTER — Ambulatory Visit (INDEPENDENT_AMBULATORY_CARE_PROVIDER_SITE_OTHER): Payer: Medicaid Other | Admitting: *Deleted

## 2014-11-21 ENCOUNTER — Ambulatory Visit (INDEPENDENT_AMBULATORY_CARE_PROVIDER_SITE_OTHER): Payer: Medicaid Other | Admitting: Nurse Practitioner

## 2014-11-21 VITALS — BP 112/60 | HR 112 | Ht >= 80 in | Wt 303.0 lb

## 2014-11-21 DIAGNOSIS — Z7901 Long term (current) use of anticoagulants: Secondary | ICD-10-CM

## 2014-11-21 DIAGNOSIS — I4891 Unspecified atrial fibrillation: Secondary | ICD-10-CM

## 2014-11-21 DIAGNOSIS — Z720 Tobacco use: Secondary | ICD-10-CM

## 2014-11-21 DIAGNOSIS — I480 Paroxysmal atrial fibrillation: Secondary | ICD-10-CM

## 2014-11-21 DIAGNOSIS — I10 Essential (primary) hypertension: Secondary | ICD-10-CM

## 2014-11-21 DIAGNOSIS — I5032 Chronic diastolic (congestive) heart failure: Secondary | ICD-10-CM

## 2014-11-21 DIAGNOSIS — N189 Chronic kidney disease, unspecified: Secondary | ICD-10-CM | POA: Diagnosis not present

## 2014-11-21 LAB — BASIC METABOLIC PANEL
BUN: 25 mg/dL — ABNORMAL HIGH (ref 6–23)
CO2: 29 mEq/L (ref 19–32)
Calcium: 8 mg/dL — ABNORMAL LOW (ref 8.4–10.5)
Chloride: 104 mEq/L (ref 96–112)
Creatinine, Ser: 3.1 mg/dL — ABNORMAL HIGH (ref 0.40–1.50)
GFR: 22.05 mL/min — ABNORMAL LOW (ref >60.00)
Glucose, Bld: 119 mg/dL — ABNORMAL HIGH (ref 70–99)
Potassium: 4.2 mEq/L (ref 3.5–5.1)
Sodium: 137 mEq/L (ref 135–145)

## 2014-11-21 LAB — CBC
HCT: 29.8 % — ABNORMAL LOW (ref 39.0–52.0)
Hemoglobin: 9.9 g/dL — ABNORMAL LOW (ref 13.0–17.0)
MCHC: 33.3 g/dL (ref 30.0–36.0)
MCV: 87.5 fl (ref 78.0–100.0)
Platelets: 364 10*3/uL (ref 150.0–400.0)
RBC: 3.41 Mil/uL — ABNORMAL LOW (ref 4.22–5.81)
RDW: 14.7 % (ref 11.5–15.5)
WBC: 7.2 10*3/uL (ref 4.0–10.5)

## 2014-11-21 LAB — POCT INR: INR: 4.7

## 2014-11-21 NOTE — Progress Notes (Signed)
CARDIOLOGY OFFICE NOTE  Date:  11/21/2014    Johnny Sims Date of Birth: 29-Dec-1955 Medical Record #[id number]  PCP:  Angelica Chessman, MD  Cardiologist:  [facility]    Chief Complaint  Patient presents with  . Atrial Fibrillation    Post hospital visit - seen for Dr. Acie Fredrickson.     History of Present Illness: Johnny Sims is a 59 y.o. male who presents today for a post hospital visit - seen for Dr. Acie Fredrickson. He has a history of PAF, on chronic anticoagulation, ongoing tobacco abuse, HTN, HLD, bipolar disorder, DM, GERD and COPD. Other issues as noted below.   Has not seen Dr. Acie Fredrickson in over one year.   Admitted earlier this month with multitude of issues. Was getting a toe amputated due to osteomyelitis/cellulitis. Developed recurrent AF - rate uncontrolled. Had associated diastolic HF exacerbation, worsening renal function with ongoing smoking. His HR was not adequately controlled during this admission. Was to see Dr. Acie Fredrickson and discuss possible cardioversion after satisfactory anticoagulation.   Comes in today. Here alone. History pretty hard to ascertain. Looks as if he had not had his coumadin checked since March - and then was admitted in May. His level was therapeutic at discharge. He says he is still weak - getting over being in the hospital. Still with swelling. No appointment yet with renal. No chest pain. Says he is taking his medicines. Not dizzy. Does not sound like he is really aware of his AF.    Past Medical History  Diagnosis Date  . PAF (paroxysmal atrial fibrillation)   . Diabetes mellitus   . Hypothyroidism   . Hyperlipidemia   . Hypertension   . Chronic pain   . OA (osteoarthritis)   . COPD (chronic obstructive pulmonary disease)   . RA (rheumatoid arthritis)   . Hepatitis C   . Venous stasis   . Bipolar 1 disorder   . Hepatitis C   . Fibromyalgia   . Asthma   . Anxiety   . Melanoma     face, shoulder arm  . Pancreatitis, acute 2015  . GERD  (gastroesophageal reflux disease)   . CKD (chronic kidney disease), stage II     Past Surgical History  Procedure Laterality Date  . Thyroidectomy    . Tonsillectomy    . US echocardiography  02/19/2010    EF 55-60%  . Skin cancer excision      melonoma  . Fracture surgery Left     arm  . Submandibular gland excision Left 12/25/2013    w/sialolithotomy  . Inguinal hernia repair Bilateral   . Hernia repair      umbicial  . Knee surgery      /H&P 02/20/2010  . Submandibular gland excision Left 12/25/2013    Procedure: SUBMANDIBULAR GLAND STONE AND CHRONIC SIALOLITHIASIS EXCISION;  Surgeon: Melida Quitter, MD;  Location: [facility];  Service: ENT;  Laterality: Left;  . Amputation Left 11/11/2014    Procedure: FOOT, SECOND Audra AMPUTATION;  Surgeon: Newt Minion, MD;  Location: [facility];  Service: Orthopedics;  Laterality: Left;     Medications: Current Outpatient Prescriptions  Medication Sig Dispense Refill  . calcitRIOL (ROCALTROL) 0.25 MCG capsule Take 1 capsule (0.25 mcg total) by mouth daily. 30 capsule 0  . COUMADIN 10 MG tablet Take 1 tablet (10 mg total) by mouth as directed. (Patient taking differently: Take 10 mg by mouth as directed. Monday, Wednesday, Friday and Saturday - alternating with 7.5mg )  30 tablet 1  . COUMADIN 7.5 MG tablet Take 1 tablet (7.5 mg total) by mouth as directed. (Patient taking differently: Take 7.5 mg by mouth as directed. Sunday, Tuesday, Thursday - alternating with 10mg ) 30 tablet 1  . diazepam (VALIUM) 10 MG tablet Take 10 mg by mouth 3 (three) times daily. For anxiety    . diltiazem (CARDIZEM CD) 240 MG 24 hr capsule Take 1 capsule (240 mg total) by mouth daily. 30 capsule 0  . doxycycline (VIBRA-TABS) 100 MG tablet Take 1 tablet (100 mg total) by mouth 2 (two) times daily. 10 tablet 0  . fluticasone (FLONASE) 50 MCG/ACT nasal spray Place 1 spray into both nostrils daily as needed for allergies.     . furosemide (LASIX) 80 MG tablet Take 2 tablets (160 mg  total) by mouth 2 (two) times daily. 120 tablet 0  . insulin glargine (LANTUS) 100 UNIT/ML injection Inject 0.22 mLs (22 Units total) into the skin at bedtime. 10 mL 11  . levothyroxine (SYNTHROID, LEVOTHROID) 88 MCG tablet Take 1 tablet (88 mcg total) by mouth daily. 30 tablet 3  . metoprolol (LOPRESSOR) 50 MG tablet Take 1 tablet (50 mg total) by mouth daily. 30 tablet 5  . Multiple Vitamin (MULTIVITAMIN) tablet Take 1 tablet by mouth every morning.     Marland Kitchen omeprazole (PRILOSEC) 40 MG capsule Take 1 capsule (40 mg total) by mouth daily. 30 capsule 5  . oxycodone (ROXICODONE) 30 MG immediate release tablet Take 1 tablet (30 mg total) by mouth every 6 (six) hours as needed for pain. 20 tablet 0  . QUEtiapine (SEROQUEL) 100 MG tablet Take 1 tablet (100 mg total) by mouth at bedtime. For sleep 30 tablet 0  . simvastatin (ZOCOR) 20 MG tablet Take 1 tablet (20 mg total) by mouth daily. 30 tablet 5  . tiotropium (SPIRIVA) 18 MCG inhalation capsule Place 1 capsule (18 mcg total) into inhaler and inhale daily. 30 capsule 0   No current facility-administered medications for this visit.    Allergies: Allergies  Allergen Reactions  . Antihistamines, Chlorpheniramine-Type Other (See Comments)    unknown  . Iodinated Diagnostic Agents Other (See Comments)    Patient states he doesn't know what iodine is and doesn't think he is allergic to it despite it being listed with his allergies  . Lidocaine Itching    Patient is uncertain of this allergy  . Methadone Other (See Comments)    "I won't take it anymore"  . Novolog [Insulin Aspart]     "I'm just highly allergic"  . Other Hives    Steroid creams  . Pentazocine Lactate Other (See Comments)    unknown  . Sulfa Antibiotics Itching and Other (See Comments)    whelps  . Vicodin [Hydrocodone-Acetaminophen] Palpitations and Other (See Comments)    Doesn't want to take    Social History: The patient  reports that he has been smoking.  He does not  have any smokeless tobacco history on file. He reports that he drinks about 0.6 oz of alcohol per week. He reports that he does not use illicit drugs.   Family History: The patient's family history includes Alzheimer's disease in his mother; Heart disease in his father; Hypertension in his mother.   Review of Systems: Please see the history of present illness.   Otherwise, the review of systems is positive for .   All other systems are reviewed and negative.   Physical Exam: VS:  BP 112/60 mmHg  Pulse 112  Ht 6\' 9"  (2.057 m)  Wt 303 lb (137.44 kg)  BMI 32.48 kg/m2 .  BMI Body mass index is 32.48 kg/(m^2).  Wt Readings from Last 3 Encounters:  11/21/14 303 lb (137.44 kg)  11/17/14 315 lb 4.1 oz (143 kg)  11/05/14 322 lb 11.2 oz (146.376 kg)   He weighed 272 a year ago.  General: Alert. Disheveled. He is in no acute distress. Weight is going down.  HEENT: Normal but with lots of missing teeth. Neck: Supple, no JVD, carotid bruits, or masses noted.  Cardiac: Irregular irregular rhythm. No murmurs, rubs, or gallops. +2 edema. Boot on the left foot. Respiratory:  Lungs are clear to auscultation bilaterally with normal work of breathing.  GI: Soft and nontender.  MS: No deformity or atrophy. Gait and ROM intact.Using a cane Skin: Warm and dry. Color is normal.  Neuro:  Strength and sensation are intact and no gross focal deficits noted.  Psych: Alert, appropriate and with normal affect.   LABORATORY DATA:  EKG:  EKG is ordered today. This demonstrates atrial fib with an elevated rate of 106  Lab Results  Component Value Date   WBC 6.1 11/15/2014   HGB 9.7* 11/15/2014   HCT 29.6* 11/15/2014   PLT 239 11/15/2014   GLUCOSE 88 11/16/2014   CHOL  02/19/2010    114        ATP III CLASSIFICATION:  <200     mg/dL   Desirable  200-239  mg/dL   Borderline High  >=240    mg/dL   High          TRIG 266* 02/19/2010   HDL 28* 02/19/2010   LDLCALC  02/19/2010    33        Total  Cholesterol/HDL:CHD Risk Coronary Heart Disease Risk Table                     Men   Women  1/2 Average Risk   3.4   3.3  Average Risk       5.0   4.4  2 X Average Risk   9.6   7.1  3 X Average Risk  23.4   11.0        Use the calculated Patient Ratio above and the CHD Risk Table to determine the patient's CHD Risk.        ATP III CLASSIFICATION (LDL):  <100     mg/dL   Optimal  100-129  mg/dL   Near or Above                    Optimal  130-159  mg/dL   Borderline  160-189  mg/dL   High  >190     mg/dL   Very High   ALT 17 11/12/2014   AST 22 11/12/2014   NA 134* 11/16/2014   K 4.2 11/16/2014   CL 98* 11/16/2014   CREATININE 3.62* 11/16/2014   BUN 28* 11/16/2014   CO2 29 11/16/2014   TSH 0.446 02/19/2010   INR 2.46* 11/17/2014   HGBA1C 7.4* 09/25/2014   Lab Results  Component Value Date   INR 2.46* 11/17/2014   INR 2.70* 11/16/2014   INR 2.69* 11/15/2014    BNP (last 3 results)  Recent Labs  10/15/14 0524 11/05/14 0205 11/11/14 0205  BNP 181.5* 194.8* 169.7*    ProBNP (last 3 results) No results for input(s): PROBNP in the last 8760 hours.   Other Studies Reviewed Today:  Echo Study Conclusions  - Procedure narrative: Transthoracic echocardiography. Image quality was adequate. The study was technically difficult. Intravenous contrast (Definity) was administered. - Left ventricle: The cavity size was normal. Wall thickness was increased in a pattern of mild LVH. Systolic function was normal. The estimated ejection fraction was in the range of 55% to 60%. Wall motion was normal; there were no regional wall motion abnormalities. The study is not technically sufficient to allow evaluation of LV diastolic function. - Aorta: Aortic root dimension: 40 mm (ED). - Aortic root: The aortic root was top normal in size. - Mitral valve: Calcified annulus. - Left atrium: The atrium was at the upper limits of normal in size.  Impressions:  -  Compared to a prior echo in 2011, there have been few changes. The aortic root measures top normal at 4.0 cm.  Assessment/Plan: 1. PAF -  Remains in atrial fib - fair control - I have left him on his current regimen. Check INR today. If good for the next 3 weekly checks - would arrange cardioversion. Coumadin is aware.   2. Chronic coumadin  3. Diastolic HF - weight is doing down. I have left him on his current regimen of diuretics for now.   4. Worsening CKD - to be seeing renal as an outpatient.   5. Bipolar disorder  Current medicines are reviewed with the patient today.  The patient does not have concerns regarding medicines other than what has been noted above.  The following changes have been made:  See above.  Labs/ tests ordered today include:    Orders Placed This Encounter  Procedures  . Basic metabolic panel  . CBC  . EKG 12-Lead     Disposition:   Further disposition to follow. Hopeful to arrange cardioversion in about 3 weeks. See Dr. Acie Fredrickson in 8 weeks.   Patient is agreeable to this plan and will call if any problems develop in the interim.   Signed: Burtis Junes, RN, ANP-C 11/21/2014 10:35 AM  [facility] [address] [place], [place]  [address] Phone: [phone] Fax: [phone]

## 2014-11-21 NOTE — Patient Instructions (Addendum)
We will be checking the following labs today - BMET and CBC  Weekly INRs - can schedule cardioversion if INRs good for the next 3 weeks  Medication Instructions:    Continue with your current medicines.     Testing/Procedures To Be Arranged:  N/A  Follow-Up:   Weekly INRs' - if your levels are good for the next 3 weeks - will then set up for cardioversion  Dr. Deterding's office to call you about your visit with him.  See Dr. Acie Fredrickson in 8 weeks    Other Special Instructions:   N/A  Call the [place] office at [phone] if you have any questions, problems or concerns.

## 2014-11-25 ENCOUNTER — Telehealth: Payer: Self-pay | Admitting: Internal Medicine

## 2014-11-25 NOTE — Telephone Encounter (Signed)
Nurse from [facility] called to let us know patient has been following up every Friday with cardiologist for INR checks. Nurse states patient has been having pain all over body. Pt's heart rate is higher than 100  Bp 140/80

## 2014-11-28 ENCOUNTER — Inpatient Hospital Stay: Payer: Medicaid Other | Admitting: Internal Medicine

## 2014-11-28 ENCOUNTER — Ambulatory Visit (INDEPENDENT_AMBULATORY_CARE_PROVIDER_SITE_OTHER): Payer: Medicaid Other

## 2014-11-28 DIAGNOSIS — I4891 Unspecified atrial fibrillation: Secondary | ICD-10-CM | POA: Diagnosis not present

## 2014-11-28 DIAGNOSIS — Z7901 Long term (current) use of anticoagulants: Secondary | ICD-10-CM | POA: Diagnosis not present

## 2014-11-28 DIAGNOSIS — I480 Paroxysmal atrial fibrillation: Secondary | ICD-10-CM

## 2014-11-28 LAB — POCT INR: INR: 2.1

## 2014-11-28 MED ORDER — COUMADIN 10 MG PO TABS: 10.0000 mg | ORAL_TABLET | ORAL | Status: DC

## 2014-12-05 ENCOUNTER — Ambulatory Visit (INDEPENDENT_AMBULATORY_CARE_PROVIDER_SITE_OTHER): Payer: Medicaid Other | Admitting: *Deleted

## 2014-12-05 DIAGNOSIS — I4891 Unspecified atrial fibrillation: Secondary | ICD-10-CM

## 2014-12-05 DIAGNOSIS — Z7901 Long term (current) use of anticoagulants: Secondary | ICD-10-CM | POA: Diagnosis not present

## 2014-12-05 DIAGNOSIS — I480 Paroxysmal atrial fibrillation: Secondary | ICD-10-CM | POA: Diagnosis not present

## 2014-12-05 LAB — POCT INR: INR: 2.6

## 2014-12-09 ENCOUNTER — Telehealth: Payer: Self-pay | Admitting: Cardiovascular Disease

## 2014-12-09 ENCOUNTER — Other Ambulatory Visit: Payer: Self-pay | Admitting: Cardiovascular Disease

## 2014-12-09 DIAGNOSIS — I480 Paroxysmal atrial fibrillation: Secondary | ICD-10-CM

## 2014-12-09 MED ORDER — METOPROLOL TARTRATE 50 MG PO TABS: 50.0000 mg | ORAL_TABLET | Freq: Two times a day (BID) | ORAL | Status: DC

## 2014-12-09 NOTE — Telephone Encounter (Signed)
Spoke with patient and advised him to d/c Diltiazem and increase Metoprolol to 50 mg twice daily and d/c Diltiazem.  He reports that he is also taking Amlodipine.  I advised patient that he was advised at hospital discharge on 5/23 that he should d/c Amlodipine.  He requests 90 day supply of Metoprolol which has been sent to his pharmacy.  Patient requests Dr. Acie Fredrickson to recommend a different PCP - states he is not happy with the care he is receiving from [facility].  I advised him that I will route the message to Dr. Acie Fredrickson for his recommendation and for his advice regarding scheduling patient's DCCV.  Patient verbalized understanding and agreement.

## 2014-12-09 NOTE — Telephone Encounter (Signed)
New message      Pt c/o medication issue:  1. Name of Medication: diltiazem 2. How are you currently taking this medication (dosage and times per day)?  240mg  daily 3. Are you having a reaction (difficulty breathing--STAT)?  no 4. What is your medication issue? Medication is making him nausea.  Pt eats 55min before taking medication Call nurse [phone] or pt at [phone]

## 2014-12-09 NOTE — Telephone Encounter (Signed)
Would increase the metoprolol to twice a day and stop the Diltiazem  Is he about ready for cardioversion??

## 2014-12-09 NOTE — Telephone Encounter (Signed)
Spoke with Mickel Baas, nurse with [facility] who reports patient c/o nausea since starting Diltiazem during hospitalization 5/23.  Mickel Baas reports patient cannot tolerate the nausea and wants to stop the medication.  She reports heart rate ranges from 76-100 bpm and BP ranges from 747'B to 403'J systolic and 09'U -43'C diastolic.  She reports that patient is non-compliant with much of his medical therapy and she does not know if he is taking the Metoprolol 50 mg as prescribed.  I advised her that Dr. Acie Fredrickson is out of the office this week and that I will discuss with Truitt Merle, NP.  She requests I call the patient with her advice.

## 2014-12-10 ENCOUNTER — Other Ambulatory Visit: Payer: Self-pay | Admitting: Family Medicine

## 2014-12-10 NOTE — Telephone Encounter (Signed)
I do not have any other suggestions re: a new primary care doctor. We can give him the list of [facility] providers and he may choose.  He has been back on coumadin and theraputic since May 27. We can set up a cardioversion for June 27 or later ( assuming that all of his INRs are theraputic)

## 2014-12-18 ENCOUNTER — Other Ambulatory Visit: Payer: Self-pay | Admitting: Internal Medicine

## 2014-12-19 ENCOUNTER — Other Ambulatory Visit: Payer: Self-pay | Admitting: Nurse Practitioner

## 2014-12-19 ENCOUNTER — Ambulatory Visit (INDEPENDENT_AMBULATORY_CARE_PROVIDER_SITE_OTHER): Payer: Medicaid Other

## 2014-12-19 ENCOUNTER — Telehealth: Payer: Self-pay | Admitting: Nurse Practitioner

## 2014-12-19 ENCOUNTER — Encounter (HOSPITAL_COMMUNITY): Payer: Self-pay

## 2014-12-19 ENCOUNTER — Encounter: Payer: Self-pay | Admitting: Nurse Practitioner

## 2014-12-19 ENCOUNTER — Emergency Department (HOSPITAL_COMMUNITY)
Admission: EM | Admit: 2014-12-19 | Discharge: 2014-12-19 | Disposition: A | Discharge status: Home / Self Care | Payer: Medicaid Other | Attending: Emergency Medicine | Admitting: Emergency Medicine

## 2014-12-19 DIAGNOSIS — E039 Hypothyroidism, unspecified: Secondary | ICD-10-CM | POA: Insufficient documentation

## 2014-12-19 DIAGNOSIS — E119 Type 2 diabetes mellitus without complications: Secondary | ICD-10-CM | POA: Insufficient documentation

## 2014-12-19 DIAGNOSIS — K219 Gastro-esophageal reflux disease without esophagitis: Secondary | ICD-10-CM | POA: Insufficient documentation

## 2014-12-19 DIAGNOSIS — Z79899 Other long term (current) drug therapy: Secondary | ICD-10-CM | POA: Diagnosis not present

## 2014-12-19 DIAGNOSIS — Z7901 Long term (current) use of anticoagulants: Secondary | ICD-10-CM

## 2014-12-19 DIAGNOSIS — E785 Hyperlipidemia, unspecified: Secondary | ICD-10-CM | POA: Diagnosis not present

## 2014-12-19 DIAGNOSIS — Z760 Encounter for issue of repeat prescription: Secondary | ICD-10-CM | POA: Diagnosis present

## 2014-12-19 DIAGNOSIS — N182 Chronic kidney disease, stage 2 (mild): Secondary | ICD-10-CM | POA: Insufficient documentation

## 2014-12-19 DIAGNOSIS — I129 Hypertensive chronic kidney disease with stage 1 through stage 4 chronic kidney disease, or unspecified chronic kidney disease: Secondary | ICD-10-CM | POA: Insufficient documentation

## 2014-12-19 DIAGNOSIS — Z8619 Personal history of other infectious and parasitic diseases: Secondary | ICD-10-CM | POA: Insufficient documentation

## 2014-12-19 DIAGNOSIS — Z8739 Personal history of other diseases of the musculoskeletal system and connective tissue: Secondary | ICD-10-CM | POA: Insufficient documentation

## 2014-12-19 DIAGNOSIS — M7989 Other specified soft tissue disorders: Secondary | ICD-10-CM | POA: Diagnosis not present

## 2014-12-19 DIAGNOSIS — G8929 Other chronic pain: Secondary | ICD-10-CM | POA: Insufficient documentation

## 2014-12-19 DIAGNOSIS — J449 Chronic obstructive pulmonary disease, unspecified: Secondary | ICD-10-CM | POA: Diagnosis not present

## 2014-12-19 DIAGNOSIS — I4891 Unspecified atrial fibrillation: Secondary | ICD-10-CM

## 2014-12-19 DIAGNOSIS — I480 Paroxysmal atrial fibrillation: Secondary | ICD-10-CM

## 2014-12-19 DIAGNOSIS — Z720 Tobacco use: Secondary | ICD-10-CM | POA: Insufficient documentation

## 2014-12-19 DIAGNOSIS — Z85820 Personal history of malignant melanoma of skin: Secondary | ICD-10-CM | POA: Diagnosis not present

## 2014-12-19 DIAGNOSIS — Z8659 Personal history of other mental and behavioral disorders: Secondary | ICD-10-CM | POA: Diagnosis not present

## 2014-12-19 DIAGNOSIS — Z794 Long term (current) use of insulin: Secondary | ICD-10-CM | POA: Insufficient documentation

## 2014-12-19 LAB — POCT INR: INR: 2.7

## 2014-12-19 MED ORDER — INSULIN GLARGINE 100 UNIT/ML SOLOSTAR PEN: 28.0000 [IU] | PEN_INJECTOR | Freq: Every day | SUBCUTANEOUS | Status: DC

## 2014-12-19 MED ORDER — INSULIN GLARGINE 100 UNIT/ML SOLOSTAR PEN: 48.0000 [IU] | PEN_INJECTOR | Freq: Every day | SUBCUTANEOUS | Status: DC

## 2014-12-19 MED ORDER — INSULIN GLULISINE 100 UNIT/ML SOLOSTAR PEN: 28.0000 [IU] | PEN_INJECTOR | Freq: Every day | SUBCUTANEOUS | Status: DC

## 2014-12-19 MED ORDER — TIOTROPIUM BROMIDE MONOHYDRATE 18 MCG IN CAPS
18.0000 ug | ORAL_CAPSULE | Freq: Every day | RESPIRATORY_TRACT | Status: DC
Start: 2014-12-19 — End: 2015-01-22

## 2014-12-19 NOTE — ED Notes (Signed)
Pt states he has 1/2 pen of lantus left but needs refill. Has appt with [facility] the week of the 7th.

## 2014-12-19 NOTE — Telephone Encounter (Signed)
Spoke with patient while he is in the office for [facility].  Patient has 3rd INR that is therapeutic and therefore per Dr. Acie Fredrickson needs to be scheduled for cardioversion.  I advised patient of lab appointment next Friday July 1 on the same day as his next Coumadin clinic appointment in preparation for cardioversion. His cardioversion will be scheduled for 7/8 with Dr. Acie Fredrickson.  Lab orders in epic.  I advised patient I will review pre-procedure instructions with him on July 1.

## 2014-12-19 NOTE — Discharge Instructions (Signed)
Please follow with your primary care doctor in the next 2 days for a check-up. They must obtain records for further management.   Do not hesitate to return to the Emergency Department for any new, worsening or concerning symptoms.   Medication Refill, Emergency Department We have refilled your medication today as a courtesy to you. It is best for your medical care, however, to take care of getting refills done through your primary caregiver's office. They have your records and can do a better job of follow-up than we can in the emergency department. On maintenance medications, we often only prescribe enough medications to get you by until you are able to see your regular caregiver. This is a more expensive way to refill medications. In the future, please plan for refills so that you will not have to use the emergency department for this. Thank you for your help. Your help allows [place] to better take care of the daily emergencies that enter our department. Document Released: 09/30/2003 Document Revised: 09/05/2011 Document Reviewed: 09/20/2013 [facility] Patient Information 2015 [place], [place]. This information is not intended to replace advice given to you by your health care provider. Make sure you discuss any questions you have with your health care provider.

## 2014-12-19 NOTE — ED Provider Notes (Signed)
CSN: [id number]     Arrival date & time 12/19/14  1213 History   This chart was scribed for Monico Blitz, PA-C working with Francine Graven, DO by Mercy Moore, ED Scribe. This patient was seen in room TR07C/TR07C and the patient's care was started at 1:11 PM.   Chief Complaint  Patient presents with  . Medication Refill    The history is provided by the patient. No language interpreter was used.   HPI Comments: Johnny Sims is a 59 y.o. male with extensive PMHx including Diabetes Mellitus Type II who presents to the Emergency Department requesting medication refill for his Lantus pen. Patient reports that he has an upcoming appointment with [facility]ia in July. Patient reports recent glucose levels measured at 85-100 at home. Patient states he does not check his sugars regularly at home. INR of 2.7 checked at Coumadin clinic today. He denies nausea, vomiting, chest pain, shortness of breath, cough. He took his last dose of insulin last night.   Past Medical History  Diagnosis Date  . PAF (paroxysmal atrial fibrillation)   . Diabetes mellitus   . Hypothyroidism   . Hyperlipidemia   . Hypertension   . Chronic pain   . OA (osteoarthritis)   . COPD (chronic obstructive pulmonary disease)   . RA (rheumatoid arthritis)   . Hepatitis C   . Venous stasis   . Bipolar 1 disorder   . Hepatitis C   . Fibromyalgia   . Asthma   . Anxiety   . Melanoma     face, shoulder arm  . Pancreatitis, acute 2015  . GERD (gastroesophageal reflux disease)   . CKD (chronic kidney disease), stage II    Past Surgical History  Procedure Laterality Date  . Thyroidectomy    . Tonsillectomy    . US echocardiography  02/19/2010    EF 55-60%  . Skin cancer excision      melonoma  . Fracture surgery Left     arm  . Submandibular gland excision Left 12/25/2013    w/sialolithotomy  . Inguinal hernia repair Bilateral   . Hernia repair      umbicial  . Knee surgery      /H&P 02/20/2010  . Submandibular  gland excision Left 12/25/2013    Procedure: SUBMANDIBULAR GLAND STONE AND CHRONIC SIALOLITHIASIS EXCISION;  Surgeon: Melida Quitter, MD;  Location: [place];  Service: ENT;  Laterality: Left;  . Amputation Left 11/11/2014    Procedure: FOOT, SECOND [place] AMPUTATION;  Surgeon: Newt Minion, MD;  Location: [place];  Service: Orthopedics;  Laterality: Left;   Family History  Problem Relation Age of Onset  . Hypertension Mother   . Alzheimer's disease Mother   . Heart disease Father    History  Substance Use Topics  . Smoking status: Current Every Day Smoker -- 1.00 packs/day for 43 years  . Smokeless tobacco: Not on file  . Alcohol Use: 0.6 oz/week    1 Cans of beer per week    Review of Systems  Constitutional: Negative for fever and chills.  Cardiovascular: Positive for leg swelling.   10 systems reviewed and found to be negative, except as noted in the HPI.    Allergies  Antihistamines, chlorpheniramine-type; Iodinated diagnostic agents; Lidocaine; Methadone; Novolog; Other; Pentazocine lactate; Sulfa antibiotics; and Vicodin  Home Medications   Prior to Admission medications   Medication Sig Start Date End Date Taking? Authorizing Provider  calcitRIOL (ROCALTROL) 0.25 MCG capsule Take 1 capsule (0.25 mcg total)  by mouth daily. 11/17/14   Delfina Redwood, MD  COUMADIN 10 MG tablet Take 1 tablet (10 mg total) by mouth as directed. 11/28/14   Thayer Headings, MD  COUMADIN 7.5 MG tablet Take 1 tablet (7.5 mg total) by mouth as directed. Patient taking differently: Take 7.5 mg by mouth as directed. Sunday, Tuesday, Thursday - alternating with 10mg  08/20/14   Thayer Headings, MD  diazepam (VALIUM) 10 MG tablet Take 10 mg by mouth 3 (three) times daily. For anxiety    Historical Provider, MD  fluticasone (FLONASE) 50 MCG/ACT nasal spray Place 1 spray into both nostrils daily as needed for allergies.     Historical Provider, MD  furosemide (LASIX) 80 MG tablet Take 2 tablets (160 mg total) by  mouth 2 (two) times daily. 11/17/14   Delfina Redwood, MD  insulin glargine (LANTUS) 100 UNIT/ML injection Inject 0.22 mLs (22 Units total) into the skin at bedtime. 11/17/14   Delfina Redwood, MD  levothyroxine (SYNTHROID, LEVOTHROID) 88 MCG tablet Take 1 tablet (88 mcg total) by mouth daily. 08/28/14   Lance Bosch, NP  metoprolol (LOPRESSOR) 50 MG tablet Take 1 tablet (50 mg total) by mouth 2 (two) times daily. 12/09/14   Thayer Headings, MD  Multiple Vitamin (MULTIVITAMIN) tablet Take 1 tablet by mouth every morning.     Historical Provider, MD  omeprazole (PRILOSEC) 40 MG capsule Take 1 capsule (40 mg total) by mouth daily. 08/28/14   Lance Bosch, NP  oxycodone (ROXICODONE) 30 MG immediate release tablet Take 1 tablet (30 mg total) by mouth every 6 (six) hours as needed for pain. 11/17/14   Delfina Redwood, MD  simvastatin (ZOCOR) 20 MG tablet Take 1 tablet (20 mg total) by mouth daily. 08/28/14   Lance Bosch, NP  tiotropium (SPIRIVA) 18 MCG inhalation capsule Place 1 capsule (18 mcg total) into inhaler and inhale daily. 12/19/14   Thayer Headings, MD   Triage Vitals: BP 157/88 mmHg  Pulse 85  Temp(Src) 98.5 F (36.9 C) (Oral)  Resp 18  Ht 6\' 9"  (2.057 m)  Wt 288 lb (130.636 kg)  BMI 30.87 kg/m2  SpO2 96% Physical Exam  Constitutional: He is oriented to person, place, and time. He appears well-developed and well-nourished. No distress.  HENT:  Head: Normocephalic and atraumatic.  Eyes: Conjunctivae and EOM are normal.  Neck: Neck supple. No tracheal deviation present.  Cardiovascular: Normal rate.   Pulmonary/Chest: Effort normal. No stridor. No respiratory distress.  Abdominal: Soft.  Musculoskeletal: Normal range of motion.  Neurological: He is alert and oriented to person, place, and time.  Skin: Skin is warm and dry.  Psychiatric: He has a normal mood and affect. His behavior is normal.  Nursing note and vitals reviewed.   ED Course  Procedures (including critical  care time)  COORDINATION OF CARE: 1:15 PM- Discussed treatment plan with patient at bedside and patient agreed to plan.   Labs Review Labs Reviewed - No data to display  Imaging Review No results found.   EKG Interpretation None      MDM   Final diagnoses:  None    Filed Vitals:   12/19/14 1305 12/19/14 1324  BP: 157/88 153/93  Pulse: 85 58  Temp: 98.5 F (36.9 C) 98.4 F (36.9 C)  TempSrc: Oral Oral  Resp: 18 18  Height: 6\' 9"  (2.057 m)   Weight: 288 lb (130.636 kg)   SpO2: 96% 97%    Johnny Sims is a pleasant 59 y.o. male presenting for medication refill of his Lantus insulin pen. Is in the process of establishing primary care at [facility]. Patient has no other complaints.  Evaluation does not show pathology that would require ongoing emergent intervention or inpatient treatment. Pt is hemodynamically stable and mentating appropriately. Discussed findings and plan with patient/guardian, who agrees with care plan. All questions answered. Return precautions discussed and outpatient follow up given.   Discharge Medication List as of 12/19/2014  1:28 PM    START taking these medications   Details  Insulin Glargine (LANTUS) 100 UNIT/ML Solostar Pen Inject 28 Units into the skin daily at 10 pm., Starting 12/19/2014, Until Discontinued, Print         I personally performed the services described in this documentation, which was scribed in my presence. The recorded information has been reviewed and is accurate.    Elmyra Ricks Eura Radabaugh, PA-C 12/20/14 0000  Francine Graven, DO 12/22/14 2215

## 2014-12-26 ENCOUNTER — Other Ambulatory Visit: Payer: Medicaid Other

## 2014-12-26 ENCOUNTER — Telehealth: Payer: Self-pay | Admitting: *Deleted

## 2014-12-26 NOTE — Telephone Encounter (Signed)
Called patient to remind him that he has missed his appointment today and to call to reschedule an appointment on today. He is pending a procedure and needs weekly INR checks. I left the [facility] clinic number and advised to return a call and schedule.

## 2014-12-28 ENCOUNTER — Emergency Department (HOSPITAL_COMMUNITY): Payer: Medicaid Other

## 2014-12-28 ENCOUNTER — Encounter (HOSPITAL_COMMUNITY): Payer: Self-pay | Admitting: *Deleted

## 2014-12-28 ENCOUNTER — Emergency Department (HOSPITAL_COMMUNITY)
Admission: EM | Admit: 2014-12-28 | Discharge: 2014-12-28 | Disposition: A | Discharge status: Home / Self Care | Payer: Medicaid Other | Attending: Emergency Medicine | Admitting: Emergency Medicine

## 2014-12-28 DIAGNOSIS — G8929 Other chronic pain: Secondary | ICD-10-CM | POA: Insufficient documentation

## 2014-12-28 DIAGNOSIS — Z8619 Personal history of other infectious and parasitic diseases: Secondary | ICD-10-CM | POA: Diagnosis not present

## 2014-12-28 DIAGNOSIS — Z794 Long term (current) use of insulin: Secondary | ICD-10-CM | POA: Insufficient documentation

## 2014-12-28 DIAGNOSIS — N182 Chronic kidney disease, stage 2 (mild): Secondary | ICD-10-CM | POA: Diagnosis not present

## 2014-12-28 DIAGNOSIS — J441 Chronic obstructive pulmonary disease with (acute) exacerbation: Secondary | ICD-10-CM | POA: Diagnosis not present

## 2014-12-28 DIAGNOSIS — M79652 Pain in left thigh: Secondary | ICD-10-CM | POA: Diagnosis not present

## 2014-12-28 DIAGNOSIS — R600 Localized edema: Secondary | ICD-10-CM | POA: Insufficient documentation

## 2014-12-28 DIAGNOSIS — Z720 Tobacco use: Secondary | ICD-10-CM | POA: Diagnosis not present

## 2014-12-28 DIAGNOSIS — E039 Hypothyroidism, unspecified: Secondary | ICD-10-CM | POA: Diagnosis not present

## 2014-12-28 DIAGNOSIS — I129 Hypertensive chronic kidney disease with stage 1 through stage 4 chronic kidney disease, or unspecified chronic kidney disease: Secondary | ICD-10-CM | POA: Diagnosis not present

## 2014-12-28 DIAGNOSIS — E785 Hyperlipidemia, unspecified: Secondary | ICD-10-CM | POA: Insufficient documentation

## 2014-12-28 DIAGNOSIS — Z79899 Other long term (current) drug therapy: Secondary | ICD-10-CM | POA: Diagnosis not present

## 2014-12-28 DIAGNOSIS — M1990 Unspecified osteoarthritis, unspecified site: Secondary | ICD-10-CM | POA: Insufficient documentation

## 2014-12-28 DIAGNOSIS — E119 Type 2 diabetes mellitus without complications: Secondary | ICD-10-CM | POA: Diagnosis not present

## 2014-12-28 DIAGNOSIS — I480 Paroxysmal atrial fibrillation: Secondary | ICD-10-CM | POA: Diagnosis not present

## 2014-12-28 DIAGNOSIS — R0602 Shortness of breath: Secondary | ICD-10-CM | POA: Diagnosis present

## 2014-12-28 DIAGNOSIS — Z7951 Long term (current) use of inhaled steroids: Secondary | ICD-10-CM | POA: Diagnosis not present

## 2014-12-28 DIAGNOSIS — Z85820 Personal history of malignant melanoma of skin: Secondary | ICD-10-CM | POA: Diagnosis not present

## 2014-12-28 DIAGNOSIS — K219 Gastro-esophageal reflux disease without esophagitis: Secondary | ICD-10-CM | POA: Insufficient documentation

## 2014-12-28 DIAGNOSIS — F419 Anxiety disorder, unspecified: Secondary | ICD-10-CM | POA: Insufficient documentation

## 2014-12-28 DIAGNOSIS — J42 Unspecified chronic bronchitis: Secondary | ICD-10-CM

## 2014-12-28 LAB — CBC WITH DIFFERENTIAL/PLATELET
Basophils Absolute: 0 10*3/uL (ref 0.0–0.1)
Basophils Relative: 0 % (ref 0–1)
EOS ABS: 0.4 10*3/uL (ref 0.0–0.7)
EOS PCT: 4 % (ref 0–5)
HCT: 31.2 % — ABNORMAL LOW (ref 39.0–52.0)
Hemoglobin: 10.7 g/dL — ABNORMAL LOW (ref 13.0–17.0)
LYMPHS PCT: 25 % (ref 12–46)
Lymphs Abs: 2.2 10*3/uL (ref 0.7–4.0)
MCH: 28.8 pg (ref 26.0–34.0)
MCHC: 34.3 g/dL (ref 30.0–36.0)
MCV: 84.1 fL (ref 78.0–100.0)
Monocytes Absolute: 0.6 10*3/uL (ref 0.1–1.0)
Monocytes Relative: 7 % (ref 3–12)
Neutro Abs: 5.6 10*3/uL (ref 1.7–7.7)
Neutrophils Relative %: 64 % (ref 43–77)
Platelets: 217 10*3/uL (ref 150–400)
RBC: 3.71 MIL/uL — AB (ref 4.22–5.81)
RDW: 15.2 % (ref 11.5–15.5)
WBC: 8.9 10*3/uL (ref 4.0–10.5)

## 2014-12-28 LAB — BASIC METABOLIC PANEL
ANION GAP: 8 (ref 5–15)
BUN: 18 mg/dL (ref 6–20)
CO2: 19 mmol/L — ABNORMAL LOW (ref 22–32)
CREATININE: 2.31 mg/dL — AB (ref 0.61–1.24)
Calcium: 7.8 mg/dL — ABNORMAL LOW (ref 8.9–10.3)
Chloride: 110 mmol/L (ref 101–111)
GFR calc Af Amer: 34 mL/min — ABNORMAL LOW (ref >60)
GFR calc non Af Amer: 29 mL/min — ABNORMAL LOW (ref >60)
GLUCOSE: 140 mg/dL — AB (ref 65–99)
POTASSIUM: 3.9 mmol/L (ref 3.5–5.1)
SODIUM: 137 mmol/L (ref 135–145)

## 2014-12-28 LAB — PROTIME-INR
INR: 2.62 — ABNORMAL HIGH (ref 0.00–1.49)
Prothrombin Time: 27.6 seconds — ABNORMAL HIGH (ref 11.6–15.2)

## 2014-12-28 LAB — I-STAT TROPONIN, ED: Troponin i, poc: 0.03 ng/mL (ref 0.00–0.08)

## 2014-12-28 MED ORDER — ALBUTEROL SULFATE HFA 108 (90 BASE) MCG/ACT IN AERS
2.0000 | INHALATION_SPRAY | Freq: Once | RESPIRATORY_TRACT | Status: AC
  Administered 2014-12-28: 2 via RESPIRATORY_TRACT
  Filled 2014-12-28: qty 6.7

## 2014-12-28 MED ORDER — OXYCODONE HCL 5 MG PO TABS
5.0000 mg | ORAL_TABLET | Freq: Once | ORAL | Status: AC
  Administered 2014-12-28: 5 mg via ORAL
  Filled 2014-12-28: qty 1

## 2014-12-28 NOTE — Discharge Instructions (Signed)

## 2014-12-28 NOTE — ED Notes (Signed)
p arrives via EMS from home c/o SOB since this morning. EMS reports diminished lung sounds. EMS administered 125 solumedrol and 5 albuterol.

## 2014-12-28 NOTE — ED Notes (Signed)
Patient transported to [facility] 

## 2014-12-28 NOTE — ED Provider Notes (Signed)
CSN: [id number]     Arrival date & time    History   First MD Initiated Contact with Patient 12/28/14 1936     Chief Complaint  Patient presents with  . Shortness of Breath     (Consider location/radiation/quality/duration/timing/severity/associated sxs/prior Treatment) Patient is a 59 y.o. male presenting with leg pain. The history is provided by the patient.  Leg Pain Location:  Leg Injury: no   Leg location:  L upper leg Pain details:    Quality:  Aching   Radiates to:  Does not radiate   Severity:  Moderate   Onset quality:  Gradual   Timing:  Intermittent   Progression:  Waxing and waning Chronicity:  Chronic Dislocation: no   Foreign body present:  No foreign bodies Tetanus status:  Up to date Prior injury to area:  No Relieved by:  Nothing Worsened by:  Activity Ineffective treatments:  None tried Associated symptoms: fatigue and swelling   Associated symptoms: no back pain, no decreased ROM, no fever, no muscle weakness, no neck pain, no numbness, no stiffness and no tingling     Past Medical History  Diagnosis Date  . PAF (paroxysmal atrial fibrillation)   . Diabetes mellitus   . Hypothyroidism   . Hyperlipidemia   . Hypertension   . Chronic pain   . OA (osteoarthritis)   . COPD (chronic obstructive pulmonary disease)   . RA (rheumatoid arthritis)   . Hepatitis C   . Venous stasis   . Bipolar 1 disorder   . Hepatitis C   . Fibromyalgia   . Asthma   . Anxiety   . Melanoma     face, shoulder arm  . Pancreatitis, acute 2015  . GERD (gastroesophageal reflux disease)   . CKD (chronic kidney disease), stage II    Past Surgical History  Procedure Laterality Date  . Thyroidectomy    . Tonsillectomy    . US echocardiography  02/19/2010    EF 55-60%  . Skin cancer excision      melonoma  . Fracture surgery Left     arm  . Submandibular gland excision Left 12/25/2013    w/sialolithotomy  . Inguinal hernia repair Bilateral   . Hernia repair       umbicial  . Knee surgery      /H&P 02/20/2010  . Submandibular gland excision Left 12/25/2013    Procedure: SUBMANDIBULAR GLAND STONE AND CHRONIC SIALOLITHIASIS EXCISION;  Surgeon: Melida Quitter, MD;  Location: [place];  Service: ENT;  Laterality: Left;  . Amputation Left 11/11/2014    Procedure: FOOT, SECOND [place] AMPUTATION;  Surgeon: Newt Minion, MD;  Location: [place];  Service: Orthopedics;  Laterality: Left;   Family History  Problem Relation Age of Onset  . Hypertension Mother   . Alzheimer's disease Mother   . Heart disease Father    History  Substance Use Topics  . Smoking status: Current Every Day Smoker -- 1.00 packs/day for 43 years  . Smokeless tobacco: Not on file  . Alcohol Use: 0.6 oz/week    1 Cans of beer per week    Review of Systems  Constitutional: Positive for fatigue. Negative for fever and diaphoresis.  Respiratory: Positive for cough, shortness of breath and wheezing. Negative for chest tightness.   Cardiovascular: Negative for chest pain.  Gastrointestinal: Negative for nausea, vomiting and abdominal pain.  Musculoskeletal: Positive for gait problem (2/2 left foot surgery and leg pain). Negative for back pain, stiffness and neck pain.  Neurological: Negative for light-headedness and headaches.  Psychiatric/Behavioral: Negative for confusion.  All other systems reviewed and are negative.     Allergies  Antihistamines, chlorpheniramine-type; Iodinated diagnostic agents; Lidocaine; Methadone; Novolog; Other; Pentazocine lactate; Sulfa antibiotics; and Vicodin  Home Medications   Prior to Admission medications   Medication Sig Start Date End Date Taking? Authorizing Provider  calcitRIOL (ROCALTROL) 0.25 MCG capsule Take 1 capsule (0.25 mcg total) by mouth daily. 11/17/14   Delfina Redwood, MD  COUMADIN 10 MG tablet Take 1 tablet (10 mg total) by mouth as directed. 11/28/14   Thayer Headings, MD  COUMADIN 7.5 MG tablet Take 1 tablet (7.5 mg total) by mouth as  directed. Patient taking differently: Take 7.5 mg by mouth as directed. Sunday, Tuesday, Thursday - alternating with 10mg  08/20/14   Thayer Headings, MD  diazepam (VALIUM) 10 MG tablet Take 10 mg by mouth 3 (three) times daily. For anxiety    Historical Provider, MD  fluticasone (FLONASE) 50 MCG/ACT nasal spray Place 1 spray into both nostrils daily as needed for allergies.     Historical Provider, MD  furosemide (LASIX) 80 MG tablet Take 2 tablets (160 mg total) by mouth 2 (two) times daily. 11/17/14   Delfina Redwood, MD  Insulin Glargine (LANTUS) 100 UNIT/ML Solostar Pen Inject 28 Units into the skin daily at 10 pm. 12/19/14   Elmyra Ricks Pisciotta, PA-C  levothyroxine (SYNTHROID, LEVOTHROID) 88 MCG tablet Take 1 tablet (88 mcg total) by mouth daily. 08/28/14   Lance Bosch, NP  metoprolol (LOPRESSOR) 50 MG tablet Take 1 tablet (50 mg total) by mouth 2 (two) times daily. 12/09/14   Thayer Headings, MD  Multiple Vitamin (MULTIVITAMIN) tablet Take 1 tablet by mouth every morning.     Historical Provider, MD  omeprazole (PRILOSEC) 40 MG capsule Take 1 capsule (40 mg total) by mouth daily. 08/28/14   Lance Bosch, NP  oxycodone (ROXICODONE) 30 MG immediate release tablet Take 1 tablet (30 mg total) by mouth every 6 (six) hours as needed for pain. 11/17/14   Delfina Redwood, MD  simvastatin (ZOCOR) 20 MG tablet Take 1 tablet (20 mg total) by mouth daily. 08/28/14   Lance Bosch, NP  tiotropium (SPIRIVA) 18 MCG inhalation capsule Place 1 capsule (18 mcg total) into inhaler and inhale daily. 12/19/14   Thayer Headings, MD   ED Triage Vitals  Enc Vitals Group     BP 12/28/14 1942 137/93 mmHg     Pulse Rate 12/28/14 1942 103     Resp 12/28/14 1942 15     Temp 12/28/14 1942 98.4 F (36.9 C)     Temp src --      SpO2 12/28/14 1937 100 %     Weight --      Height --      Head Cir --      Peak Flow --      Pain Score 12/28/14 1939 10     Pain Loc --      Pain Edu? --      Excl. in [place]? --      Physical Exam  Constitutional: He is oriented to person, place, and time. He appears well-developed and well-nourished. No distress.  HENT:  Head: Normocephalic and atraumatic.  Nose: Nose normal.  Mouth/Throat: Oropharynx is clear and moist. No oropharyngeal exudate.  Eyes: EOM are normal. Pupils are equal, round, and reactive to light.  Neck: Normal range of motion. Neck supple.  Cardiovascular: Normal rate, regular rhythm, normal heart sounds and intact distal pulses.   No murmur heard. Pulmonary/Chest: Effort normal and breath sounds normal. No respiratory distress. He has no wheezes. He exhibits no tenderness.  O2 sats in upper 90s on room air.  Clear lungs, normal work of breathing, no wheezing.    Abdominal: Soft. He exhibits no distension. There is no tenderness. There is no guarding.  Musculoskeletal: Normal range of motion. He exhibits edema (1+ bilateral lower extremity pitting edema) and tenderness.  Bilateral lower extremities with chronic venous stasis skin changes.  Left foot in dressing and OR walking shoe.  No new lesions or traumatic findings noted.  Full ROM, able to ambulate normally in NAD.   No midline C/T/L spine tenderness   Neurological: He is alert and oriented to person, place, and time. No cranial nerve deficit. Coordination normal.  Skin: Skin is warm and dry. He is not diaphoretic. No pallor.  Psychiatric: He has a normal mood and affect. His behavior is normal. Judgment and thought content normal.  Nursing note and vitals reviewed.   ED Course  Procedures (including critical care time) Labs Review Labs Reviewed  CBC WITH DIFFERENTIAL/PLATELET - Abnormal; Notable for the following:    RBC 3.71 (*)    Hemoglobin 10.7 (*)    HCT 31.2 (*)    All other components within normal limits  BASIC METABOLIC PANEL - Abnormal; Notable for the following:    CO2 19 (*)    Glucose, Bld 140 (*)    Creatinine, Ser 2.31 (*)    Calcium 7.8 (*)    GFR calc non Af  Amer 29 (*)    GFR calc Af Amer 34 (*)    All other components within normal limits  PROTIME-INR - Abnormal; Notable for the following:    Prothrombin Time 27.6 (*)    INR 2.62 (*)    All other components within normal limits  I-STAT TROPOININ, ED    Imaging Review Dg Chest 2 View  12/28/2014   CLINICAL DATA:  59 year old with current history of hypertension and diabetes, hepatitis-C, with prior episodes of CHF, presenting with acute onset of shortness of breath which began earlier today.  EXAM: CHEST  2 VIEW  COMPARISON:  11/12/2014 and earlier.  FINDINGS: Cardiac silhouette moderately enlarged, unchanged. Interval development of moderate diffuse interstitial pulmonary edema. Small bilateral pleural effusions. No confluent airspace consolidation. Thoracic aorta mildly tortuous and atherosclerotic, unchanged. Visualized bony thorax intact.  IMPRESSION: CHF, with stable cardiomegaly and moderate diffuse interstitial pulmonary edema. Small bilateral pleural effusions.   Electronically Signed   By: Evangeline Dakin M.D.   On: 12/28/2014 20:20     EKG Interpretation   Date/Time:  Sunday December 28 2014 19:38:56 EDT Ventricular Rate:  101 PR Interval:    QRS Duration: 98 QT Interval:  375 QTC Calculation: 486 R Axis:   78 Text Interpretation:  Atrial fibrillation Ventricular premature complex  Borderline prolonged QT interval ED PHYSICIAN INTERPRETATION AVAILABLE IN  [facility] Confirmed by TEST, Record ([id number]) on 12/29/2014 12:57:58 PM      MDM   Final diagnoses:  Chronic bronchitis, unspecified chronic bronchitis type  Left thigh pain   Pt is a 59 yo M with hx of HTN, Afib (on coumadin), DM., COPD and chronic pain who presents complaining of SOB and left thigh pain.  Has a long hx of narcotic use and reports he has been out for "some time".   Called EMS due to increased  left thigh pain and SOB.  He was wheezing on EMS arrival so given albuterol nebulizer and solumedrol en route.   Clear breaths and normal work of breathing on arrival to [facility] ED.  Denies any recent increase in his baseline cough, no sputum production, no fevers.  He has normal work of breathing and no wheezing here, O2 sats in upper 90s on room air.  No indication that he needs additional treatment at this time.   Complaining of left leg pain, chronic.  Still ambulatory and steady gait.    Worked up with CXR, EKG, and labs to evaluate electrolytes, kidney function, and INR Given one dose of roxicodone for left thigh pain.  Informed that we will not provide an Rx for narcotics on dc.    Labs ok, including INR. No O2 requirements.  CXR unchanged  To f/u this week with cards for elective cardioversion for Afib.   Has a f/u with [place] [facility] next week for PCP  To discuss long term pain control then   Discharged in good condition.  Ambulatory, no SOB, no difficulty moving all extremities.     If performed, labs, EKGs, and imaging were reviewed and interpreted by myself and my attending, and incorporated in the medical decision making.  Patient was seen with ED Attending, Dr. Lucina Mellow, MD    Tori Milks, MD 12/29/14 [place], MD 01/11/15 [phone]

## 2014-12-29 ENCOUNTER — Other Ambulatory Visit: Payer: Self-pay | Admitting: Internal Medicine

## 2014-12-31 NOTE — Telephone Encounter (Signed)
Spoke with patient who is scheduled for cardioversion on Friday July 8.  The patient did not come in for his scheduled lab work on Friday July 1 and I attempted to reschedule this in preparation for his procedure.  Patient did not want to commit to a date/tiime for lab and wants to postpone cardioversion so I advised him to call me back when he is ready to reschedule the procedure.  Patient verbalized understanding and agreement.

## 2015-01-01 ENCOUNTER — Other Ambulatory Visit (INDEPENDENT_AMBULATORY_CARE_PROVIDER_SITE_OTHER): Payer: Medicaid Other | Admitting: *Deleted

## 2015-01-01 DIAGNOSIS — I480 Paroxysmal atrial fibrillation: Secondary | ICD-10-CM | POA: Diagnosis not present

## 2015-01-01 LAB — CBC WITH DIFFERENTIAL/PLATELET
BASOS ABS: 0 10*3/uL (ref 0.0–0.1)
Basophils Relative: 0.2 % (ref 0.0–3.0)
Eosinophils Absolute: 0.3 10*3/uL (ref 0.0–0.7)
Eosinophils Relative: 3 % (ref 0.0–5.0)
HCT: 34.1 % — ABNORMAL LOW (ref 39.0–52.0)
Hemoglobin: 11.4 g/dL — ABNORMAL LOW (ref 13.0–17.0)
LYMPHS ABS: 2.4 10*3/uL (ref 0.7–4.0)
Lymphocytes Relative: 23.9 % (ref 12.0–46.0)
MCHC: 33.5 g/dL (ref 30.0–36.0)
MCV: 87 fl (ref 78.0–100.0)
MONO ABS: 0.8 10*3/uL (ref 0.1–1.0)
Monocytes Relative: 8.4 % (ref 3.0–12.0)
NEUTROS PCT: 64.5 % (ref 43.0–77.0)
Neutro Abs: 6.6 10*3/uL (ref 1.4–7.7)
Platelets: 263 10*3/uL (ref 150.0–400.0)
RBC: 3.91 Mil/uL — ABNORMAL LOW (ref 4.22–5.81)
RDW: 16 % — AB (ref 11.5–15.5)
WBC: 10.2 10*3/uL (ref 4.0–10.5)

## 2015-01-01 LAB — BASIC METABOLIC PANEL
BUN: 37 mg/dL — AB (ref 6–23)
CALCIUM: 8.2 mg/dL — AB (ref 8.4–10.5)
CHLORIDE: 108 meq/L (ref 96–112)
CO2: 24 mEq/L (ref 19–32)
CREATININE: 2.47 mg/dL — AB (ref 0.40–1.50)
GFR: 28.65 mL/min — ABNORMAL LOW (ref >60.00)
Glucose, Bld: 116 mg/dL — ABNORMAL HIGH (ref 70–99)
Potassium: 4.1 mEq/L (ref 3.5–5.1)
Sodium: 139 mEq/L (ref 135–145)

## 2015-01-02 ENCOUNTER — Ambulatory Visit (HOSPITAL_COMMUNITY): Admission: RE | Admit: 2015-01-02 | Payer: Medicaid Other | Source: Ambulatory Visit | Admitting: Cardiovascular Disease

## 2015-01-02 ENCOUNTER — Encounter (HOSPITAL_COMMUNITY): Admission: RE | Payer: Self-pay | Source: Ambulatory Visit

## 2015-01-02 SURGERY — CARDIOVERSION
Anesthesia: Monitor Anesthesia Care

## 2015-01-07 ENCOUNTER — Encounter: Payer: Self-pay | Admitting: Internal Medicine

## 2015-01-07 ENCOUNTER — Ambulatory Visit: Payer: Medicaid Other | Admitting: Cardiovascular Disease

## 2015-01-07 ENCOUNTER — Emergency Department (HOSPITAL_COMMUNITY)
Admission: EM | Admit: 2015-01-07 | Discharge: 2015-01-07 | Disposition: A | Discharge status: Home / Self Care | Payer: Medicaid Other | Attending: Emergency Medicine | Admitting: Emergency Medicine

## 2015-01-07 ENCOUNTER — Encounter: Payer: Self-pay | Admitting: Nurse Practitioner

## 2015-01-07 ENCOUNTER — Encounter (HOSPITAL_COMMUNITY): Payer: Self-pay | Admitting: Emergency Medicine

## 2015-01-07 ENCOUNTER — Ambulatory Visit (INDEPENDENT_AMBULATORY_CARE_PROVIDER_SITE_OTHER): Payer: Medicaid Other | Admitting: Internal Medicine

## 2015-01-07 ENCOUNTER — Telehealth: Payer: Self-pay | Admitting: Nurse Practitioner

## 2015-01-07 VITALS — BP 132/83 | HR 109 | Temp 98.1°F | Ht >= 80 in | Wt 289.0 lb

## 2015-01-07 DIAGNOSIS — N182 Chronic kidney disease, stage 2 (mild): Secondary | ICD-10-CM | POA: Insufficient documentation

## 2015-01-07 DIAGNOSIS — Z720 Tobacco use: Secondary | ICD-10-CM | POA: Diagnosis not present

## 2015-01-07 DIAGNOSIS — E039 Hypothyroidism, unspecified: Secondary | ICD-10-CM | POA: Diagnosis not present

## 2015-01-07 DIAGNOSIS — J449 Chronic obstructive pulmonary disease, unspecified: Secondary | ICD-10-CM | POA: Diagnosis not present

## 2015-01-07 DIAGNOSIS — F319 Bipolar disorder, unspecified: Secondary | ICD-10-CM | POA: Diagnosis not present

## 2015-01-07 DIAGNOSIS — B182 Chronic viral hepatitis C: Secondary | ICD-10-CM

## 2015-01-07 DIAGNOSIS — F419 Anxiety disorder, unspecified: Secondary | ICD-10-CM | POA: Diagnosis not present

## 2015-01-07 DIAGNOSIS — E119 Type 2 diabetes mellitus without complications: Secondary | ICD-10-CM | POA: Insufficient documentation

## 2015-01-07 DIAGNOSIS — Z79899 Other long term (current) drug therapy: Secondary | ICD-10-CM | POA: Insufficient documentation

## 2015-01-07 DIAGNOSIS — Z8619 Personal history of other infectious and parasitic diseases: Secondary | ICD-10-CM | POA: Diagnosis not present

## 2015-01-07 DIAGNOSIS — Z794 Long term (current) use of insulin: Secondary | ICD-10-CM | POA: Diagnosis not present

## 2015-01-07 DIAGNOSIS — Z760 Encounter for issue of repeat prescription: Secondary | ICD-10-CM

## 2015-01-07 DIAGNOSIS — I129 Hypertensive chronic kidney disease with stage 1 through stage 4 chronic kidney disease, or unspecified chronic kidney disease: Secondary | ICD-10-CM | POA: Insufficient documentation

## 2015-01-07 DIAGNOSIS — K219 Gastro-esophageal reflux disease without esophagitis: Secondary | ICD-10-CM | POA: Insufficient documentation

## 2015-01-07 DIAGNOSIS — G8929 Other chronic pain: Secondary | ICD-10-CM | POA: Insufficient documentation

## 2015-01-07 DIAGNOSIS — Z23 Encounter for immunization: Secondary | ICD-10-CM | POA: Diagnosis not present

## 2015-01-07 DIAGNOSIS — E785 Hyperlipidemia, unspecified: Secondary | ICD-10-CM | POA: Insufficient documentation

## 2015-01-07 MED ORDER — ELBASVIR-GRAZOPREVIR 50-100 MG PO TABS: 1.0000 | ORAL_TABLET | Freq: Every day | ORAL | Status: DC

## 2015-01-07 MED ORDER — LEVOTHYROXINE SODIUM 88 MCG PO TABS: 88.0000 ug | ORAL_TABLET | Freq: Every day | ORAL | Status: DC

## 2015-01-07 NOTE — Progress Notes (Signed)
+Johnny Sims is a 59 y.o. male who presents for initial evaluation and management of a positive Hepatitis C antibody test.  Patient tested positive last year. Hepatitis C risk factors present are: none. Patient denies intranasal drug use, IV drug abuse, sexual contact with person with liver disease, tattoos. Patient has had other studies performed. Results: hepatitis C RNA by PCR, result: positive. Patient has not had prior treatment for Hepatitis C. Patient does not have a past history of liver disease. Patient does not have a family history of liver disease.   HPI: He has a GFR under 30.  No recent elastography.    Patient does not have documented immunity to Hepatitis A. Patient does have documented immunity to Hepatitis B.     Review of Systems A comprehensive review of systems was negative.   Past Medical History  Diagnosis Date  . PAF (paroxysmal atrial fibrillation)   . Diabetes mellitus   . Hypothyroidism   . Hyperlipidemia   . Hypertension   . Chronic pain   . OA (osteoarthritis)   . COPD (chronic obstructive pulmonary disease)   . RA (rheumatoid arthritis)   . Hepatitis C   . Venous stasis   . Bipolar 1 disorder   . Hepatitis C   . Fibromyalgia   . Asthma   . Anxiety   . Melanoma     face, shoulder arm  . Pancreatitis, acute 2015  . GERD (gastroesophageal reflux disease)   . CKD (chronic kidney disease), stage II     Prior to Admission medications   Medication Sig Start Date End Date Taking? Authorizing Provider  calcitRIOL (ROCALTROL) 0.25 MCG capsule Take 1 capsule (0.25 mcg total) by mouth daily. 11/17/14  Yes Delfina Redwood, MD  COUMADIN 10 MG tablet Take 1 tablet (10 mg total) by mouth as directed. 11/28/14  Yes Thayer Headings, MD  COUMADIN 7.5 MG tablet Take 1 tablet (7.5 mg total) by mouth as directed. Patient taking differently: Take 7.5 mg by mouth as directed. Sunday, Tuesday, Thursday - alternating with 10mg  08/20/14  Yes Thayer Headings, MD   fluticasone [facility]) 50 MCG/ACT nasal spray Place 1 spray into both nostrils daily as needed for allergies.    Yes Historical Provider, MD  furosemide (LASIX) 80 MG tablet Take 2 tablets (160 mg total) by mouth 2 (two) times daily. 11/17/14  Yes Delfina Redwood, MD  Insulin Glargine (LANTUS) 100 UNIT/ML Solostar Pen Inject 28 Units into the skin daily at 10 pm. 12/19/14  Yes Nicole Pisciotta, PA-C  metoprolol (LOPRESSOR) 50 MG tablet Take 1 tablet (50 mg total) by mouth 2 (two) times daily. 12/09/14  Yes Thayer Headings, MD  Multiple Vitamin (MULTIVITAMIN) tablet Take 1 tablet by mouth every morning.    Yes Historical Provider, MD  omeprazole (PRILOSEC) 40 MG capsule Take 1 capsule (40 mg total) by mouth daily. 08/28/14  Yes Lance Bosch, NP  simvastatin (ZOCOR) 20 MG tablet Take 1 tablet (20 mg total) by mouth daily. 08/28/14  Yes Lance Bosch, NP  tiotropium (SPIRIVA) 18 MCG inhalation capsule Place 1 capsule (18 mcg total) into inhaler and inhale daily. 12/19/14  Yes Thayer Headings, MD  diazepam (VALIUM) 10 MG tablet Take 10 mg by mouth 3 (three) times daily. For anxiety    Historical Provider, MD  Elbasvir-Grazoprevir (ZEPATIER) 50-100 MG TABS Take 1 tablet by mouth daily. 01/07/15   Thayer Headings, MD  levothyroxine (SYNTHROID, LEVOTHROID) 88 MCG tablet Take  1 tablet (88 mcg total) by mouth daily. 01/07/15   Hannah Muthersbaugh, PA-C  oxycodone (ROXICODONE) 30 MG immediate release tablet Take 1 tablet (30 mg total) by mouth every 6 (six) hours as needed for pain. Patient not taking: Reported on 01/07/2015 11/17/14   Delfina Redwood, MD    Allergies  Allergen Reactions  . Antihistamines, Chlorpheniramine-Type Other (See Comments)    unknown  . Iodinated Diagnostic Agents Other (See Comments)    Patient states he doesn't know what iodine is and doesn't think he is allergic to it despite it being listed with his allergies  . Lidocaine Itching    Patient is uncertain of this allergy  .  Methadone Other (See Comments)    "I won't take it anymore"  . Novolog [Insulin Aspart]     "I'm just highly allergic"  . Other Hives    Steroid creams  . Pentazocine Lactate Other (See Comments)    unknown  . Sulfa Antibiotics Itching and Other (See Comments)    whelps  . Vicodin [Hydrocodone-Acetaminophen] Palpitations and Other (See Comments)    Doesn't want to take    History  Substance Use Topics  . Smoking status: Current Every Day Smoker -- 1.00 packs/day for 43 years  . Smokeless tobacco: Never Used  . Alcohol Use: Yes    Family History  Problem Relation Age of Onset  . Hypertension Mother   . Alzheimer's disease Mother   . Heart disease Father       Objective:   Filed Vitals:   01/07/15 1407  BP: 132/83  Pulse: 109  Temp: 98.1 F (36.7 C)   in no apparent distress and alert HEENT: anicteric Cor RRR and No murmurs clear Bowel sounds are normal, liver is not enlarged, spleen is not enlarged peripheral pulses normal, no pedal edema, no clubbing or cyanosis negative for - jaundice, spider hemangioma, telangiectasia, palmar erythema, ecchymosis and atrophy  Laboratory Genotype:  Lab Results  Component Value Date   HCVGENOTYPE 1a 09/23/2014   HCV viral load:  Lab Results  Component Value Date   HCVQUANT 825053* 11/12/2014   Lab Results  Component Value Date   WBC 10.2 01/01/2015   HGB 11.4* 01/01/2015   HCT 34.1* 01/01/2015   MCV 87.0 01/01/2015   PLT 263.0 01/01/2015    Lab Results  Component Value Date   CREATININE 2.47* 01/01/2015   BUN 37* 01/01/2015   NA 139 01/01/2015   K 4.1 01/01/2015   CL 108 01/01/2015   CO2 24 01/01/2015    Lab Results  Component Value Date   ALT 17 11/12/2014   AST 22 11/12/2014   ALKPHOS 45 11/12/2014   BILITOT 0.4 11/12/2014   INR 2.62* 12/28/2014      Assessment: Chronic Hepatitis C genotype 1a  Plan: 1) Patient counseled extensively on limiting acetaminophen to no more than 2 grams daily,  avoidance of alcohol. 2) Transmission discussed with patient including sexual transmission, sharing razors and toothbrush.   3) Will need referral to gastroenterology if concern for cirrhosis 4) Will need referral for substance abuse counseling: No. 5) Will prescribe Zapetier for 12 weeks, pending NS5A testing 6) Hepatitis A vaccine Yes.   7) Hepatitis B vaccine No. 8) Pneumovax vaccine if concern for cirrhosis 9) will follow up after starting medication

## 2015-01-07 NOTE — ED Provider Notes (Signed)
CSN: [id number]     Arrival date & time 01/07/15  1509 History   This chart was scribed for non-physician practitioner, Abigail Butts PA-C  working with Pamella Pert, MD by Forrestine Him, ED Scribe. This patient was seen in room TR11C/TR11C and the patient's care was started at 4:28 PM.   Chief Complaint  Patient presents with  . Medication Refill   The history is provided by the patient and medical records. No language interpreter was used.    HPI Comments: Johnny Sims is a 59 y.o. male with  PMHx of DM, hyperlipidemia, hypothyroidism, HTN, and CKD who presents to the Emergency Department here for a medication refill this afternoon. Pt states he has been out of is thyroid medication for 3 days now. States he is unable to get an appointment with his new PCP for another 16 days. However, pt was not established with a PCP and used the [facility] when needed. He denies any other complaints at this time.  He presents from Dr. Henreitta Leber office.    Past Medical History  Diagnosis Date  . PAF (paroxysmal atrial fibrillation)   . Diabetes mellitus   . Hypothyroidism   . Hyperlipidemia   . Hypertension   . Chronic pain   . OA (osteoarthritis)   . COPD (chronic obstructive pulmonary disease)   . RA (rheumatoid arthritis)   . Hepatitis C   . Venous stasis   . Bipolar 1 disorder   . Hepatitis C   . Fibromyalgia   . Asthma   . Anxiety   . Melanoma     face, shoulder arm  . Pancreatitis, acute 2015  . GERD (gastroesophageal reflux disease)   . CKD (chronic kidney disease), stage II    Past Surgical History  Procedure Laterality Date  . Thyroidectomy    . Tonsillectomy    . US echocardiography  02/19/2010    EF 55-60%  . Skin cancer excision      melonoma  . Fracture surgery Left     arm  . Submandibular gland excision Left 12/25/2013    w/sialolithotomy  . Inguinal hernia repair Bilateral   . Hernia repair      umbicial  . Knee surgery      /H&P  02/20/2010  . Submandibular gland excision Left 12/25/2013    Procedure: SUBMANDIBULAR GLAND STONE AND CHRONIC SIALOLITHIASIS EXCISION;  Surgeon: Melida Quitter, MD;  Location: [place];  Service: ENT;  Laterality: Left;  . Amputation Left 11/11/2014    Procedure: FOOT, SECOND [place]illin AMPUTATION;  Surgeon: Newt Minion, MD;  Location: [place];  Service: Orthopedics;  Laterality: Left;   Family History  Problem Relation Age of Onset  . Hypertension Mother   . Alzheimer's disease Mother   . Heart disease Father    History  Substance Use Topics  . Smoking status: Current Every Day Smoker -- 1.00 packs/day for 43 years  . Smokeless tobacco: Never Used  . Alcohol Use: Yes    Review of Systems  Constitutional: Negative for fever, chills, diaphoresis, appetite change, fatigue and unexpected weight change.  HENT: Negative for mouth sores.   Eyes: Negative for visual disturbance.  Respiratory: Negative for cough, chest tightness, shortness of breath and wheezing.   Cardiovascular: Negative for chest pain.  Gastrointestinal: Negative for nausea, vomiting, abdominal pain, diarrhea and constipation.  Endocrine: Negative for polydipsia, polyphagia and polyuria.  Genitourinary: Negative for dysuria, urgency, frequency and hematuria.  Musculoskeletal: Negative for back pain  and neck stiffness.  Skin: Negative for rash.  Allergic/Immunologic: Negative for immunocompromised state.  Neurological: Negative for syncope, light-headedness and headaches.  Hematological: Does not bruise/bleed easily.  Psychiatric/Behavioral: Negative for confusion and sleep disturbance. The patient is not nervous/anxious.       Allergies  Antihistamines, chlorpheniramine-type; Iodinated diagnostic agents; Lidocaine; Methadone; Novolog; Other; Pentazocine lactate; Sulfa antibiotics; and Vicodin  Home Medications   Prior to Admission medications   Medication Sig Start Date End Date Taking? Authorizing Provider  calcitRIOL  (ROCALTROL) 0.25 MCG capsule Take 1 capsule (0.25 mcg total) by mouth daily. 11/17/14   Delfina Redwood, MD  COUMADIN 10 MG tablet Take 1 tablet (10 mg total) by mouth as directed. 11/28/14   Thayer Headings, MD  COUMADIN 7.5 MG tablet Take 1 tablet (7.5 mg total) by mouth as directed. Patient taking differently: Take 7.5 mg by mouth as directed. Sunday, Tuesday, Thursday - alternating with 10mg  08/20/14   Thayer Headings, MD  diazepam (VALIUM) 10 MG tablet Take 10 mg by mouth 3 (three) times daily. For anxiety    Historical Provider, MD  Elbasvir-Grazoprevir (ZEPATIER) 50-100 MG TABS Take 1 tablet by mouth daily. 01/07/15   Thayer Headings, MD  fluticasone (FLONASE) 50 MCG/ACT nasal spray Place 1 spray into both nostrils daily as needed for allergies.     Historical Provider, MD  furosemide (LASIX) 80 MG tablet Take 2 tablets (160 mg total) by mouth 2 (two) times daily. 11/17/14   Delfina Redwood, MD  Insulin Glargine (LANTUS) 100 UNIT/ML Solostar Pen Inject 28 Units into the skin daily at 10 pm. 12/19/14   Elmyra Ricks Pisciotta, PA-C  levothyroxine (SYNTHROID, LEVOTHROID) 88 MCG tablet Take 1 tablet (88 mcg total) by mouth daily. 01/07/15   Kae Lauman, PA-C  metoprolol (LOPRESSOR) 50 MG tablet Take 1 tablet (50 mg total) by mouth 2 (two) times daily. 12/09/14   Thayer Headings, MD  Multiple Vitamin (MULTIVITAMIN) tablet Take 1 tablet by mouth every morning.     Historical Provider, MD  omeprazole (PRILOSEC) 40 MG capsule Take 1 capsule (40 mg total) by mouth daily. 08/28/14   Lance Bosch, NP  oxycodone (ROXICODONE) 30 MG immediate release tablet Take 1 tablet (30 mg total) by mouth every 6 (six) hours as needed for pain. Patient not taking: Reported on 01/07/2015 11/17/14   Delfina Redwood, MD  simvastatin (ZOCOR) 20 MG tablet Take 1 tablet (20 mg total) by mouth daily. 08/28/14   Lance Bosch, NP  tiotropium (SPIRIVA) 18 MCG inhalation capsule Place 1 capsule (18 mcg total) into inhaler and  inhale daily. 12/19/14   Thayer Headings, MD   Triage Vitals: BP 155/99 mmHg  Pulse 107  Temp(Src) 98.7 F (37.1 C) (Oral)  Resp 16  Ht 6\' 9"  (2.057 m)  Wt 284 lb (128.822 kg)  BMI 30.45 kg/m2  SpO2 99%   Physical Exam  Constitutional: He appears well-developed and well-nourished. No distress.  Awake, alert, nontoxic appearance  HENT:  Head: Normocephalic and atraumatic.  Mouth/Throat: Oropharynx is clear and moist. No oropharyngeal exudate.  Eyes: Conjunctivae are normal. No scleral icterus.  Neck: Normal range of motion. Neck supple.  Cardiovascular: Normal rate, normal heart sounds and intact distal pulses.  An irregularly irregular rhythm present.  Pulmonary/Chest: Effort normal and breath sounds normal. No respiratory distress. He has no wheezes.  Equal chest expansion  Abdominal: Soft. Bowel sounds are normal. He exhibits no mass. There is no tenderness. There is  no rebound and no guarding.  Musculoskeletal: Normal range of motion. He exhibits no edema.  Neurological: He is alert.  Speech is clear and goal oriented Moves extremities without ataxia  Skin: Skin is warm and dry. He is not diaphoretic.  Chronic venostasis changes to bilateral lower legs  Psychiatric: He has a normal mood and affect.  Nursing note and vitals reviewed.   ED Course  Procedures (including critical care time)  DIAGNOSTIC STUDIES: Oxygen Saturation is 99% on RA, Normal by my interpretation.    COORDINATION OF CARE: 4:38 PM- Will provide pt with one refill of Synthroid. Discussed treatment plan with pt at bedside and pt agreed to plan.     Labs Review Labs Reviewed - No data to display  Imaging Review No results found.   EKG Interpretation None      MDM   Final diagnoses:  Medication refill    Johnny Sims presents with need for medication refill of his synthroid.  No somatic complaints.  At this time there does not appear to be any evidence of an acute emergency medical  condition and the patient appears stable for discharge with appropriate outpatient follow up. The patient that we will be unable to continue to fill his medications here in the emergency department and he is to follow-up with his primary care physician.  BP 155/99 mmHg  Pulse 107  Temp(Src) 98.7 F (37.1 C) (Oral)  Resp 16  Ht 6\' 9"  (2.057 m)  Wt 284 lb (128.822 kg)  BMI 30.45 kg/m2  SpO2 99%  I personally performed the services described in this documentation, which was scribed in my presence. The recorded information has been reviewed and is accurate.   Jarrett Soho Daveigh Batty, PA-C 01/07/15 [place], MD 01/09/15 [phone]

## 2015-01-07 NOTE — ED Notes (Signed)
Pt here for med refill on thyroid medication. Denies any symptoms or problems. sts he hasnt had in 3 days,.

## 2015-01-07 NOTE — ED Notes (Signed)
Request refill on synthroid.  States he is unable to get appt with new PCP for 16 more days.  Denies any other complaints.

## 2015-01-07 NOTE — Telephone Encounter (Signed)
Patient called from [facility] ED after receiving 2 messages from me earlier today regarding cardioversion scheduled Friday 7/15 with Dr. Acie Fredrickson.  I reviewed pre-procedure instructions with him and he verbalized understanding.  He needs a PT/inr check tomorrow and patient is aware to come at 10:30 on 7/14 for this test.  I have talked with Elbert Ewings, RN from [facility] about patient and scheduled appointment with her approval.  The patient verbalized understanding of plan of care and states he will arrive at [facility] Admitting on Friday at 12:00; will be NPO after midnight and will continue current medications.  I advised the patient to call back with questions or concerns.

## 2015-01-07 NOTE — Discharge Instructions (Signed)
1. Medications: Synthroid, usual home medications 2. Treatment: rest, drink plenty of fluids,  3. Follow Up: Please followup with your primary doctor in as soon as possible for discussion of your diagnoses and further evaluation after today's visit; if you do not have a primary care doctor use the resource guide provided to find one; Please return to the ER for concerning symptoms    Medication Refill, Emergency Department We have refilled your medication today as a courtesy to you. It is best for your medical care, however, to take care of getting refills done through your primary caregiver's office. They have your records and can do a better job of follow-up than we can in the emergency department. On maintenance medications, we often only prescribe enough medications to get you by until you are able to see your regular caregiver. This is a more expensive way to refill medications. In the future, please plan for refills so that you will not have to use the emergency department for this. Thank you for your help. Your help allows [place] to better take care of the daily emergencies that enter our department. Document Released: 09/30/2003 Document Revised: 09/05/2011 Document Reviewed: 09/20/2013 [facility] Patient Information 2015 [place], [place]. This information is not intended to replace advice given to you by your health care provider. Make sure you discuss any questions you have with your health care provider.    Emergency Department Resource Guide 1) Find a Doctor and Pay Out of Pocket Although you won't have to find out who is covered by your insurance plan, it is a good idea to ask around and get recommendations. You will then need to call the office and see if the doctor you have chosen will accept you as a new patient and what types of options they offer for patients who are self-pay. Some doctors offer discounts or will set up payment plans for their patients who do not have insurance, but you  will need to ask so you aren't surprised when you get to your appointment.  2) Contact Your Local Health Department Not all health departments have doctors that can see patients for sick visits, but many do, so it is worth a call to see if yours does. If you don't know where your local health department is, you can check in your phone book. The CDC also has a tool to help you locate your state's health department, and many state websites also have listings of all of their local health departments.  3) Find a [facility] If your illness is not likely to be very severe or complicated, you may want to try a walk in clinic. These are popping up all over the country in pharmacies, drugstores, and shopping centers. They're usually staffed by nurse practitioners or physician assistants that have been trained to treat common illnesses and complaints. They're usually fairly quick and inexpensive. However, if you have serious medical issues or chronic medical problems, these are probably not your best option.  No Primary Care Doctor: - Call Health Connect at  [phone] - they can help you locate a primary care doctor that  accepts your insurance, provides certain services, etc. - Physician Referral Service- [phone]  Chronic Pain Problems: Organization         Address  Phone   Notes  [facility]  [phone] Patients need to be referred by their primary care doctor.   Medication Assistance: Organization         Address  Phone   Notes  [facility] Medication [facility] [address] [place], [place] [address] [phone] --Must be a resident of [facility] -- Must have NO insurance coverage whatsoever (no Medicaid/ Medicare, etc.) -- The pt. MUST have a primary care doctor that directs their care regularly and follows them in the community   MedAssist  [phone]   [employer]  [phone]    Agencies that provide inexpensive medical  care: Organization         Address  Phone   Notes  [place]  [phone]   [facility]    [phone]   [facility] [place], [place] [address] [phone]   [place] [address] [place] [phone]   Planned Parenthood    [phone]   [facility]    [phone]   [place] and [place] [address], [place] Phone:  [phone], Fax:  [phone] Hours of Operation:  9 am - 6 pm, M-F.  Also accepts Medicaid/Medicare and self-pay.  [facility] for [place], [address], [place] Phone: [phone], Fax: [phone]. Hours of Operation:  8:30 am - 5:30 pm, M-F.  Also accepts Medicaid and self-pay.  [facility] [place] [address] [place] Phone: [phone]   [place], [place], [place] [phone], Ext. [phone] Mondays & Thursdays: 7-9 AM.  First 15 patients are seen on a first come, first serve basis.    [place] Providers:  Organization         Address  Phone   Notes  [facility] [address], [place] [phone] Also accepts self-pay patients.  [facility] [address], [place]  [phone]   [place], [address], [place] [phone]   [facility] [address], [place] [phone]   Lucianne Lei [address], [place]   [phone] Only accepts [place] Access [place] patients after they have their name applied to their card.   Self-Pay (no insurance) in [facility]:  Organization         Address  Phone   Notes  Sickle Cell Patients, [facility] Internal Medicine [place] [phone]   [facility] [place] [phone]   [facility] [place]  [place], [place], [place] [phone]   [facility]/Dr. Osei-Bonsu  [address], [place] or [address], [place] [phone] Phone number for both [place] and [place] locations is the same.  [facility] [address] [place] [phone]   [facility] [address] [place] or [address] [phone]   [facility] [address] [place] [phone], phone; [phone], fax Sees patients 1st and 3rd Saturday of every month.  Must not qualify for public or private insurance (i.e. Medicaid, Medicare, [place] Health Choice, Veterans' Benefits)  Household income should be no more than 200% of the poverty level The clinic cannot treat you if you are pregnant or think you are pregnant  Sexually transmitted diseases are not treated at the clinic.    Dental Care: Organization         Address  Phone  Notes  [facility] Department of [facility] [place] [phone] Accepts children up to age 43 who are enrolled in [place] or [place]; pregnant women with a Medicaid card; and children who have applied for Medicaid or [place] Health Choice, but were declined, whose parents can pay a reduced fee at time of service.  [facility] Department of [facility]  [address], [place] [phone] Accepts children up to age 3 who are enrolled in [place] or [place]; pregnant women with a Medicaid card; and children who have applied for Medicaid or [place] Health Choice, but were declined, whose parents can pay a reduced fee at time of service.  [facility] Adult [facility] Access PROGRAM  [place] [phone] Patients are seen by appointment only. Walk-ins are not accepted. [place] will see patients 79 years of age and older. Monday - Tuesday (8am-5pm) Most Wednesdays (8:30-5pm) $30 per visit, cash only  [facility] Adult [facility] Access PRO[facility]  [address], [facility] [phone] Patients are seen by appointment only. Walk-ins are not accepted. [place] will see patients 77 years of age and older. One Wednesday Evening (Monthly: Volunteer Based).  $30 per visit, cash only  [place]  [phone] for adults; Children under age 58, call Graduate Pediatric Dentistry at [phone]. Children aged 87-14, please call [phone] to request a pediatric application.  Dental services are provided in all areas of dental care including fillings, crowns and bridges, complete and partial dentures, implants, gum treatment, root canals, and extractions. Preventive care is also provided. Treatment is provided to both adults and children. Patients are selected via a lottery and there is often a waiting list.   [facility] [address], [place]  [phone] [email or web address]   [facility] [address] [place], [place] [phone], Ext. [phone] Second and Fourth Thursday of each month, opens at 6:30 AM; Clinic ends at 9 AM.  Patients are seen on a first-come first-served basis, and a limited number are seen during each clinic.   [facility]  [address] [place], [place] [phone]   Eligibility Requirements You must have lived in [place], [place], or [place] counties for at least the last three months.   You cannot be eligible for state or federal sponsored [employer], including [employer], [place], or [employer].   You generally cannot be eligible for healthcare insurance through your employer.    How to apply: Eligibility screenings are held every Tuesday and Wednesday afternoon from 1:00 pm until 4:00 pm. You do not need an appointment for the interview!   [facility] [address], [place], [place]   [place]  [place] Department  [place]  [phone]    Behavioral Health Resources in the Community: Intensive Outpatient Programs Organization         Address  Phone  Notes  [place]. [address], [place], [place] [phone]   [facility]patient [address], [place], [place]   ADS: Alcohol & Drug Svcs [address], [place], [place]  [place] [address],  [place], [place] or [phone]   Substance Abuse Resources Organization         Address  Phone  Notes  Alcohol and Drug Services  [phone]   [place]  [phone]   [facility] [place]   Chinita Pester  [phone]   Residential & Outpatient Substance Abuse Program  [phone]   Psychological Services Organization         Address  Phone  Notes  [facility]  [place]  [phone]   [place] [address] [place] or [phone]    Mobile Crisis Teams Organization         Address  Phone  Notes  [facility], Mobile Crisis Care Unit  [phone]   [facility]  [address] [place], [place]   [place] Levels [address], [place] [phone]    Self-Help/Support Groups Organization         Address  Phone             Notes  [place]. of [place] - variety of support groups  [place] Call for more information  Narcotics Anonymous (NA), [facility] [address], [employer] [place]  2 meetings at this location   Special educational needs teacher         Address  Phone  Notes  [facility] Residential Treatment [place],    [place]  [phone]   [facility]  [address] [place] 884166, [place], [place]   [place], [place] [phone] Admissions: 8am-3pm M-F  Incentives Substance Circle [address],    [place], [place] [phone]   [facility] [address] [place], [place], [place]   [facility] [address],  [place], [place]   [facility]ight Programs - Intensive Outpatient [address] Kristeen Mans [address], [place], [place]   [facility] ([place].,  [place], [place] [phone] or [phone]   [facility] Services (RTS) [address], [place], [place] Accepts Medicaid  [facility] [place] [address]  [place] [phone] Substance Abuse/Addiction Treatment   [facility] Organization         Address  Phone  Notes  [facility]  [phone]   Domenic Schwab, PhD [address] Arlis Porta [place], [place]   [phone] or [phone]   [place], [place] [phone]   [facility] [address] [place], [place] [phone] Insurance/Medicaid/sponsorship through [facility] and Families [address], [address] [place]                                    [place], [place] [phone] [place] [address], [place] [phone]    Dr. Adele Schilder  [phone]   [facility] [place] Dept. 1) [address] [place] 2) [place] 3)  [place] [address], [place] [phone]  [phone]   Olivia Lopez de Gutierrez [phone] or [phone] (  After Hours)

## 2015-01-08 ENCOUNTER — Telehealth: Payer: Self-pay | Admitting: Cardiovascular Disease

## 2015-01-08 NOTE — Telephone Encounter (Signed)
Events noted

## 2015-01-08 NOTE — Telephone Encounter (Signed)
Pt called wanting to know what to do because he was told to come to our office today for INR but he is unable to get here. Pt states that he walks everywhere and it is too hot for him to walk the 4 blocks here today. Pt states that he was also seen in the ED yesterday to get his thyroid medication filled because he has been out for 4 days. Pt states that pharmacy is bringing the medication today. Informed pt that we could do a STAT INR at the hospital tomorrow prior to Cardioversion. Informed pt that I would route this information to Dr. Acie Fredrickson to make him aware and would call back if Dr. Acie Fredrickson needed to make any changes.

## 2015-01-08 NOTE — Telephone Encounter (Signed)
New Message   Pt was in the hospital and he wants to speak to RN about his visit

## 2015-01-09 ENCOUNTER — Encounter (HOSPITAL_COMMUNITY): Admission: RE | Payer: Self-pay | Source: Ambulatory Visit

## 2015-01-09 ENCOUNTER — Telehealth: Payer: Self-pay | Admitting: Cardiovascular Disease

## 2015-01-09 ENCOUNTER — Ambulatory Visit (HOSPITAL_COMMUNITY): Admission: RE | Admit: 2015-01-09 | Payer: Medicaid Other | Source: Ambulatory Visit | Admitting: Cardiovascular Disease

## 2015-01-09 SURGERY — CARDIOVERSION
Anesthesia: Monitor Anesthesia Care

## 2015-01-09 NOTE — Telephone Encounter (Signed)
Said he already talked with someone at the office and is scheduled to see Dr. Acie Fredrickson on August 3

## 2015-01-09 NOTE — Telephone Encounter (Signed)
New message      Pt want Dr Acie Fredrickson to know [facility] because he did not have transportation to pick him up

## 2015-01-12 LAB — HCV RNA NS5A DRUG RESISTANCE

## 2015-01-22 ENCOUNTER — Ambulatory Visit (INDEPENDENT_AMBULATORY_CARE_PROVIDER_SITE_OTHER): Payer: Medicaid Other | Admitting: *Deleted

## 2015-01-22 ENCOUNTER — Emergency Department (HOSPITAL_COMMUNITY)
Admission: EM | Admit: 2015-01-22 | Discharge: 2015-01-22 | Disposition: A | Discharge status: Home / Self Care | Payer: Medicaid Other | Attending: Emergency Medicine | Admitting: Emergency Medicine

## 2015-01-22 ENCOUNTER — Encounter (HOSPITAL_COMMUNITY): Payer: Self-pay | Admitting: Emergency Medicine

## 2015-01-22 DIAGNOSIS — E039 Hypothyroidism, unspecified: Secondary | ICD-10-CM | POA: Diagnosis not present

## 2015-01-22 DIAGNOSIS — I480 Paroxysmal atrial fibrillation: Secondary | ICD-10-CM

## 2015-01-22 DIAGNOSIS — I4891 Unspecified atrial fibrillation: Secondary | ICD-10-CM

## 2015-01-22 DIAGNOSIS — Z8619 Personal history of other infectious and parasitic diseases: Secondary | ICD-10-CM | POA: Diagnosis not present

## 2015-01-22 DIAGNOSIS — Z7951 Long term (current) use of inhaled steroids: Secondary | ICD-10-CM | POA: Insufficient documentation

## 2015-01-22 DIAGNOSIS — Z720 Tobacco use: Secondary | ICD-10-CM | POA: Diagnosis not present

## 2015-01-22 DIAGNOSIS — M1990 Unspecified osteoarthritis, unspecified site: Secondary | ICD-10-CM | POA: Insufficient documentation

## 2015-01-22 DIAGNOSIS — M2540 Effusion, unspecified joint: Secondary | ICD-10-CM | POA: Insufficient documentation

## 2015-01-22 DIAGNOSIS — E785 Hyperlipidemia, unspecified: Secondary | ICD-10-CM | POA: Diagnosis not present

## 2015-01-22 DIAGNOSIS — Z79899 Other long term (current) drug therapy: Secondary | ICD-10-CM | POA: Diagnosis not present

## 2015-01-22 DIAGNOSIS — N182 Chronic kidney disease, stage 2 (mild): Secondary | ICD-10-CM | POA: Insufficient documentation

## 2015-01-22 DIAGNOSIS — Z794 Long term (current) use of insulin: Secondary | ICD-10-CM | POA: Insufficient documentation

## 2015-01-22 DIAGNOSIS — Z85820 Personal history of malignant melanoma of skin: Secondary | ICD-10-CM | POA: Insufficient documentation

## 2015-01-22 DIAGNOSIS — K219 Gastro-esophageal reflux disease without esophagitis: Secondary | ICD-10-CM | POA: Insufficient documentation

## 2015-01-22 DIAGNOSIS — F419 Anxiety disorder, unspecified: Secondary | ICD-10-CM | POA: Diagnosis not present

## 2015-01-22 DIAGNOSIS — G8929 Other chronic pain: Secondary | ICD-10-CM | POA: Insufficient documentation

## 2015-01-22 DIAGNOSIS — E119 Type 2 diabetes mellitus without complications: Secondary | ICD-10-CM | POA: Insufficient documentation

## 2015-01-22 DIAGNOSIS — Z760 Encounter for issue of repeat prescription: Secondary | ICD-10-CM | POA: Insufficient documentation

## 2015-01-22 DIAGNOSIS — J45901 Unspecified asthma with (acute) exacerbation: Secondary | ICD-10-CM | POA: Diagnosis not present

## 2015-01-22 DIAGNOSIS — Z7901 Long term (current) use of anticoagulants: Secondary | ICD-10-CM | POA: Diagnosis not present

## 2015-01-22 DIAGNOSIS — I129 Hypertensive chronic kidney disease with stage 1 through stage 4 chronic kidney disease, or unspecified chronic kidney disease: Secondary | ICD-10-CM | POA: Insufficient documentation

## 2015-01-22 LAB — POCT INR: INR: 2.6

## 2015-01-22 MED ORDER — FUROSEMIDE 80 MG PO TABS: 160.0000 mg | ORAL_TABLET | Freq: Two times a day (BID) | ORAL | Status: DC

## 2015-01-22 MED ORDER — TIOTROPIUM BROMIDE MONOHYDRATE 18 MCG IN CAPS: 18.0000 ug | ORAL_CAPSULE | Freq: Every day | RESPIRATORY_TRACT | Status: DC

## 2015-01-22 MED ORDER — ELBASVIR-GRAZOPREVIR 50-100 MG PO TABS: 1.0000 | ORAL_TABLET | Freq: Every day | ORAL | Status: DC

## 2015-01-22 MED ORDER — INSULIN GLARGINE 100 UNIT/ML SOLOSTAR PEN: 28.0000 [IU] | PEN_INJECTOR | Freq: Every day | SUBCUTANEOUS | Status: DC

## 2015-01-22 MED ORDER — LEVOTHYROXINE SODIUM 88 MCG PO TABS: 88.0000 ug | ORAL_TABLET | Freq: Every day | ORAL | Status: DC

## 2015-01-22 NOTE — ED Provider Notes (Signed)
CSN: [id number]     Arrival date & time 01/22/15  1032 History   This chart was scribed for non-physician practitioner, Hyman Bible, PA-C working with Leonard Schwartz, MD by Tula Nakayama, ED scribe. This patient was seen in room TR08C/TR08C and the patient's care was started at 11:46 AM    Chief Complaint  Patient presents with  . Medication Refill   The history is provided by the patient. No language interpreter was used.   HPI Comments: Johnny Sims is a 59 y.o. male with an extensive medical history listed below who presents to the Emergency Department for a medication refill of all of his medications. Pt notes mild pain under his left jaw, baseline edema of his bilateral legs that is unchanged today and baseline SOB that is unchanged today. Pt reports that he has had trouble finding a PCP who will refill his medication so he comes to the ED every 30 days for refills. He has tried following up with  who said they could not see him until November. Pt has also been seen by [facility] who told him to follow-up on August 1st, but notes that he did not like it there. Pt states that he is awaiting referrals to the rheumatologist, dermatologist and urologist. Pt was last seen in the ED on 7/13 for a refill of his thyroid medication. He denies CP.   Past Medical History  Diagnosis Date  . PAF (paroxysmal atrial fibrillation)   . Diabetes mellitus   . Hypothyroidism   . Hyperlipidemia   . Hypertension   . Chronic pain   . OA (osteoarthritis)   . COPD (chronic obstructive pulmonary disease)   . RA (rheumatoid arthritis)   . Hepatitis C   . Venous stasis   . Bipolar 1 disorder   . Hepatitis C   . Fibromyalgia   . Asthma   . Anxiety   . Melanoma     face, shoulder arm  . Pancreatitis, acute 2015  . GERD (gastroesophageal reflux disease)   . CKD (chronic kidney disease), stage II    Past Surgical History  Procedure Laterality Date  . Thyroidectomy    . Tonsillectomy     . US echocardiography  02/19/2010    EF 55-60%  . Skin cancer excision      melonoma  . Fracture surgery Left     arm  . Submandibular gland excision Left 12/25/2013    w/sialolithotomy  . Inguinal hernia repair Bilateral   . Hernia repair      umbicial  . Knee surgery      /H&P 02/20/2010  . Submandibular gland excision Left 12/25/2013    Procedure: SUBMANDIBULAR GLAND STONE AND CHRONIC SIALOLITHIASIS EXCISION;  Surgeon: Melida Quitter, MD;  Location: [facility];  Service: ENT;  Laterality: Left;  . 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 Hypertension Mother   . Alzheimer's disease Mother   . Heart disease Father    History  Substance Use Topics  . Smoking status: Current Every Day Smoker -- 1.00 packs/day for 43 years  . Smokeless tobacco: Never Used  . Alcohol Use: Yes    Review of Systems  Respiratory: Positive for shortness of breath (chronic).   Cardiovascular: Negative for chest pain.  Musculoskeletal: Positive for joint swelling and arthralgias.  Allergies  Antihistamines, chlorpheniramine-type; Iodinated diagnostic agents; Lidocaine; Methadone; Novolog; Other; Pentazocine lactate; Sulfa antibiotics; and Vicodin  Home Medications   Prior to Admission medications   Medication Sig Start Date End Date Taking? Authorizing Provider  calcitRIOL (ROCALTROL) 0.25 MCG capsule Take 1 capsule (0.25 mcg total) by mouth daily. 11/17/14   Delfina Redwood, MD  COUMADIN 10 MG tablet Take 1 tablet (10 mg total) by mouth as directed. 11/28/14   Thayer Headings, MD  COUMADIN 7.5 MG tablet Take 1 tablet (7.5 mg total) by mouth as directed. Patient taking differently: Take 7.5 mg by mouth as directed. Sunday, Tuesday, Thursday - alternating with 10mg  08/20/14   Thayer Headings, MD  diazepam (VALIUM) 10 MG tablet Take 10  mg by mouth 3 (three) times daily. For anxiety    Historical Provider, MD  Elbasvir-Grazoprevir (ZEPATIER) 50-100 MG TABS Take 1 tablet by mouth daily. 01/07/15   Thayer Headings, MD  fluticasone (FLONASE) 50 MCG/ACT nasal spray Place 1 spray into both nostrils daily as needed for allergies.     Historical Provider, MD  furosemide (LASIX) 80 MG tablet Take 2 tablets (160 mg total) by mouth 2 (two) times daily. 11/17/14   Delfina Redwood, MD  Insulin Glargine (LANTUS) 100 UNIT/ML Solostar Pen Inject 28 Units into the skin daily at 10 pm. 12/19/14   Elmyra Ricks Pisciotta, PA-C  levothyroxine (SYNTHROID, LEVOTHROID) 88 MCG tablet Take 1 tablet (88 mcg total) by mouth daily. 01/07/15   Hannah Muthersbaugh, PA-C  metoprolol (LOPRESSOR) 50 MG tablet Take 1 tablet (50 mg total) by mouth 2 (two) times daily. 12/09/14   Thayer Headings, MD  Multiple Vitamin (MULTIVITAMIN) tablet Take 1 tablet by mouth every morning.     Historical Provider, MD  omeprazole (PRILOSEC) 40 MG capsule Take 1 capsule (40 mg total) by mouth daily. 08/28/14   Lance Bosch, NP  oxycodone (ROXICODONE) 30 MG immediate release tablet Take 1 tablet (30 mg total) by mouth every 6 (six) hours as needed for pain. Patient not taking: Reported on 01/07/2015 11/17/14   Delfina Redwood, MD  simvastatin (ZOCOR) 20 MG tablet Take 1 tablet (20 mg total) by mouth daily. 08/28/14   Lance Bosch, NP  tiotropium (SPIRIVA) 18 MCG inhalation capsule Place 1 capsule (18 mcg total) into inhaler and inhale daily. 12/19/14   Wonda Cheng Nahser, MD   BP 150/93 mmHg  Pulse 111  Temp(Src) 98.1 F (36.7 C) (Oral)  Resp 16  SpO2 97% Physical Exam  Constitutional: He appears well-developed and well-nourished. No distress.  HENT:  Head: Normocephalic and atraumatic.  Eyes: Conjunctivae and EOM are normal.  Neck: Neck supple. No tracheal deviation present.  Cardiovascular: Normal rate, regular rhythm and normal heart sounds.   Pulmonary/Chest: Effort normal and  breath sounds normal. No respiratory distress.  Skin: Skin is warm and dry.  Psychiatric: He has a normal mood and affect. His behavior is normal.  Nursing note and vitals reviewed.   ED Course  Procedures   DIAGNOSTIC STUDIES: Oxygen Saturation is 97% on RA, normal by my interpretation.    COORDINATION OF CARE: 12:04 PM Discussed treatment plan with pt which includes refill and referral to [facility]. Pt agreed to plan.   Labs Review Labs Reviewed - No data to display  Imaging Review No results found.   EKG Interpretation None      MDM   Final diagnoses:  None  Patient presents today for medication refill.  He denies any new symptoms at this time.  He reports that he does not currently have a PCP.  Patient given rx of some of the medications.  He has been told that his pain medication will not be refilled.  Patient given referral to [facility] to establish care with PCP.  Return precautions given.   I personally performed the services described in this documentation, which was scribed in my presence. The recorded information has been reviewed and is accurate.       Hyman Bible, PA-C 01/23/15 2014  Leonard Schwartz, MD 01/24/15 [phone]

## 2015-01-22 NOTE — ED Notes (Signed)
Pt. Stated, I need my medication refilled

## 2015-01-22 NOTE — ED Notes (Signed)
Patient given information for [facility] for further medication refills per PA.   Patient states he has had trouble in the past getting a primary care doctor.

## 2015-01-22 NOTE — Discharge Instructions (Signed)
Medication Refill, Emergency Department °We have refilled your medication today as a courtesy to you. It is best for your medical care, however, to take care of getting refills done through your primary caregiver's office. They have your records and can do a better job of follow-up than we can in the emergency department. °On maintenance medications, we often only prescribe enough medications to get you by until you are able to see your regular caregiver. This is a more expensive way to refill medications. °In the future, please plan for refills so that you will not have to use the emergency department for this. °Thank you for your help. Your help allows us to better take care of the daily emergencies that enter our department. °Document Released: 09/30/2003 Document Revised: 09/05/2011 Document Reviewed: 09/20/2013 °ExitCare® Patient Information ©2015 [employer]. This information is not intended to replace advice given to you by your health care provider. Make sure you discuss any questions you have with your health care provider. ° °

## 2015-01-28 ENCOUNTER — Ambulatory Visit (INDEPENDENT_AMBULATORY_CARE_PROVIDER_SITE_OTHER): Payer: Medicaid Other | Admitting: *Deleted

## 2015-01-28 ENCOUNTER — Encounter: Payer: Self-pay | Admitting: Cardiovascular Disease

## 2015-01-28 ENCOUNTER — Encounter: Payer: Self-pay | Admitting: Nurse Practitioner

## 2015-01-28 ENCOUNTER — Ambulatory Visit (INDEPENDENT_AMBULATORY_CARE_PROVIDER_SITE_OTHER): Payer: Medicaid Other | Admitting: Cardiovascular Disease

## 2015-01-28 VITALS — BP 130/80 | HR 98 | Ht >= 80 in | Wt 285.0 lb

## 2015-01-28 DIAGNOSIS — I4891 Unspecified atrial fibrillation: Secondary | ICD-10-CM | POA: Diagnosis not present

## 2015-01-28 DIAGNOSIS — I480 Paroxysmal atrial fibrillation: Secondary | ICD-10-CM

## 2015-01-28 DIAGNOSIS — I4819 Other persistent atrial fibrillation: Secondary | ICD-10-CM

## 2015-01-28 DIAGNOSIS — I481 Persistent atrial fibrillation: Secondary | ICD-10-CM | POA: Diagnosis not present

## 2015-01-28 DIAGNOSIS — Z7901 Long term (current) use of anticoagulants: Secondary | ICD-10-CM

## 2015-01-28 LAB — BASIC METABOLIC PANEL
BUN: 27 mg/dL — AB (ref 6–23)
CALCIUM: 8.3 mg/dL — AB (ref 8.4–10.5)
CHLORIDE: 108 meq/L (ref 96–112)
CO2: 26 meq/L (ref 19–32)
Creatinine, Ser: 2.38 mg/dL — ABNORMAL HIGH (ref 0.40–1.50)
GFR: 29.89 mL/min — AB (ref >60.00)
Glucose, Bld: 77 mg/dL (ref 70–99)
POTASSIUM: 3.9 meq/L (ref 3.5–5.1)
Sodium: 140 mEq/L (ref 135–145)

## 2015-01-28 LAB — POCT INR: INR: 3

## 2015-01-28 LAB — CBC WITH DIFFERENTIAL/PLATELET
Basophils Absolute: 0 10*3/uL (ref 0.0–0.1)
Basophils Relative: 0.2 % (ref 0.0–3.0)
EOS PCT: 3.1 % (ref 0.0–5.0)
Eosinophils Absolute: 0.3 10*3/uL (ref 0.0–0.7)
HCT: 34.4 % — ABNORMAL LOW (ref 39.0–52.0)
Hemoglobin: 11.6 g/dL — ABNORMAL LOW (ref 13.0–17.0)
Lymphocytes Relative: 36.6 % (ref 12.0–46.0)
Lymphs Abs: 3.1 10*3/uL (ref 0.7–4.0)
MCHC: 33.8 g/dL (ref 30.0–36.0)
MCV: 86.5 fl (ref 78.0–100.0)
MONOS PCT: 7.9 % (ref 3.0–12.0)
Monocytes Absolute: 0.7 10*3/uL (ref 0.1–1.0)
NEUTROS ABS: 4.4 10*3/uL (ref 1.4–7.7)
Neutrophils Relative %: 52.2 % (ref 43.0–77.0)
PLATELETS: 205 10*3/uL (ref 150.0–400.0)
RBC: 3.98 Mil/uL — AB (ref 4.22–5.81)
RDW: 15.4 % (ref 11.5–15.5)
WBC: 8.5 10*3/uL (ref 4.0–10.5)

## 2015-01-28 NOTE — Progress Notes (Signed)
Johnny Sims Date of Birth  [birth year]       [facility] [address], [address], [place], [place]  [address]   [place], [place]  [address] [phone]     [phone]   Fax  [phone]    Fax [phone]  Problem List: 1. Paroxysmal atrial fibrillation 2. Hypothyroidism 3. Diabetes mellitus 4. Hyperlipidemia 5. Hypertension 6. Chronic pain  History of Present Illness:  Johnny Sims is a 59 yo who I have seen in the past for PAF.  His primary medical office is the [facility]  [facility] in [place].  He is on his third doctor in the past several years. He is not very  happy about this.  He's having lots of abdominal pain. He's had a poor appetite. He complains of being very thirsty and his reflux water. Also complains of polyuria. He states that his glucose levels have been well controlled.  He has having lots of generalized fatigue. She also is having trouble swallowing and chokes when he eats his cereal in the morning.  He denies any chest pain or shortness breath. He was seen in the emergency room in November, 2013 gallbladder pain. He was noted to be in normal sinus rhythm by EKG at that time.  September 26, 2012:  Johnny Sims has been doing well.  He would like to try Xarelto instead of coumadin.  He is doing well.  No CP.  He is having some problems with his Hep C.    Sept. 11, 2014:  Johnny Sims is seen back for eval of his PAF.  Has had a cough.  Still smoking.    October 14, 2013:  No cardiac complaints today.  Having left face pain due to a salivary gland stone.  Also has had some abdominal pain.   Was found to have ? Gall bladder and possibly pancreatitis problems.    He is walking some - several times a week.  Still smoking.    He thinks that he is taking both generic warfarin and brand name coumadin.   January 28, 2015:  Johnny Sims has developed atrial fib. Was set up for cardioversion but did not have a ride home so he cancelled.    Having some shortness of breath.   Current Outpatient Prescriptions on File Prior to Visit  Medication Sig Dispense Refill  . calcitRIOL (ROCALTROL) 0.25 MCG capsule Take 1 capsule (0.25 mcg total) by mouth daily. 30 capsule 0  . COUMADIN 10 MG tablet Take 1 tablet (10 mg total) by mouth as directed. 30 tablet 1  . COUMADIN 7.5 MG tablet Take 1 tablet (7.5 mg total) by mouth as directed. (Patient taking differently: Take 7.5 mg by mouth as directed. Sunday, Tuesday, Thursday - alternating with 10mg ) 30 tablet 1  . Elbasvir-Grazoprevir (ZEPATIER) 50-100 MG TABS Take 1 tablet by mouth daily. 10 tablet 0  . fluticasone (FLONASE) 50 MCG/ACT nasal spray Place 1 spray into both nostrils daily as needed for allergies.     . furosemide (LASIX) 80 MG tablet Take 2 tablets (160 mg total) by mouth 2 (two) times daily. 40 tablet 0  . Insulin Glargine (LANTUS SOLOSTAR) 100 UNIT/ML Solostar Pen Inject 28 Units into the skin daily at 10 pm. 15 mL 0  . levothyroxine (SYNTHROID) 88 MCG tablet Take 1 tablet (88 mcg total) by mouth daily before breakfast. 10 tablet 0  . Multiple Vitamin (MULTIVITAMIN) tablet Take  1 tablet by mouth every morning.     Marland Kitchen omeprazole (PRILOSEC) 40 MG capsule Take 1 capsule (40 mg total) by mouth daily. 30 capsule 5  . simvastatin (ZOCOR) 20 MG tablet Take 1 tablet (20 mg total) by mouth daily. 30 tablet 5  . tiotropium (SPIRIVA HANDIHALER) 18 MCG inhalation capsule Place 1 capsule (18 mcg total) into inhaler and inhale daily. 30 capsule 0  . diazepam (VALIUM) 10 MG tablet Take 10 mg by mouth 3 (three) times daily. For anxiety    . metoprolol (LOPRESSOR) 50 MG tablet Take 1 tablet (50 mg total) by mouth 2 (two) times daily. (Patient not taking: Reported on 01/28/2015) 180 tablet 3  . oxycodone (ROXICODONE) 30 MG immediate release tablet Take 1 tablet (30 mg total) by mouth every 6 (six) hours as needed for pain. (Patient not taking: Reported on 01/07/2015) 20 tablet 0  . [DISCONTINUED]  Insulin Glulisine (APIDRA) 100 UNIT/ML Solostar Pen Inject 28 Units into the skin at bedtime. 15 mL 11   No current facility-administered medications on file prior to visit.    Allergies  Allergen Reactions  . Antihistamines, Chlorpheniramine-Type Other (See Comments)    unknown  . Iodinated Diagnostic Agents Other (See Comments)    Patient states he doesn't know what iodine is and doesn't think he is allergic to it despite it being listed with his allergies  . Lidocaine Itching    Patient is uncertain of this allergy  . Methadone Other (See Comments)    "I won't take it anymore"  . Novolog [Insulin Aspart]     "I'm just highly allergic"  . Other Hives    Steroid creams  . Pentazocine Lactate Other (See Comments)    unknown  . Sulfa Antibiotics Itching and Other (See Comments)    whelps  . Vicodin [Hydrocodone-Acetaminophen] Palpitations and Other (See Comments)    Doesn't want to take    Past Medical History  Diagnosis Date  . PAF (paroxysmal atrial fibrillation)   . Diabetes mellitus   . Hypothyroidism   . Hyperlipidemia   . Hypertension   . Chronic pain   . OA (osteoarthritis)   . COPD (chronic obstructive pulmonary disease)   . RA (rheumatoid arthritis)   . Hepatitis C   . Venous stasis   . Bipolar 1 disorder   . Hepatitis C   . Fibromyalgia   . Asthma   . Anxiety   . Melanoma     face, shoulder arm  . Pancreatitis, acute 2015  . GERD (gastroesophageal reflux disease)   . CKD (chronic kidney disease), stage II     Past Surgical History  Procedure Laterality Date  . Thyroidectomy    . Tonsillectomy    . US echocardiography  02/19/2010    EF 55-60%  . Skin cancer excision      melonoma  . Fracture surgery Left     arm  . Submandibular gland excision Left 12/25/2013    w/sialolithotomy  . Inguinal hernia repair Bilateral   . Hernia repair      umbicial  . Knee surgery      /H&P 02/20/2010  . Submandibular gland excision Left 12/25/2013    Procedure:  SUBMANDIBULAR GLAND STONE AND CHRONIC SIALOLITHIASIS EXCISION;  Surgeon: Melida Quitter, MD;  Location: [facility];  Service: ENT;  Laterality: Left;  . Amputation Left 11/11/2014    Procedure: FOOT, SECOND [place] AMPUTATION;  Surgeon: Newt Minion, MD;  Location: [facility];  Service: Orthopedics;  Laterality:  Left;    History  Smoking status  . Current Every Day Smoker -- 1.00 packs/day for 43 years  Smokeless tobacco  . Never Used    History  Alcohol Use  . Yes    Family History  Problem Relation Age of Onset  . Hypertension Mother   . Alzheimer's disease Mother   . Heart disease Father     Reviw of Systems:  Reviewed in the HPI.  All other systems are negative.  Physical Exam: Blood pressure 130/80, pulse 98, height 6\' 9"  (2.057 m), weight 129.275 kg (285 lb), SpO2 97 %. General: Well developed, well nourished, in no acute distress.  Head: Normocephalic, atraumatic, sclera non-icteric, mucus membranes are moist,  Poor dentition  Neck: Supple. Carotids are 2 + without bruits. No JVD   Lungs: bilateral wheezing.   R> L   Heart: Irreg. Irreg.  soft systolic murmur.  Abdomen: Soft, non-tender, non-distended with normal bowel sounds.  Msk:  Strength and tone are normal   Extremities: No clubbing or cyanosis. 1-2 +   edema.  Chronic stasis changes.   Distal pedal pulses are 2+ and equal   Neuro: CN II - XII intact.  Alert and oriented X 3.   Psych:  He has a history of bipolar disease.  ECG:  Assessment / Plan:   1. atrial fibrillation - he's had persistent atrial fibrillation. We had to cancel  his most recent scheduled cardioversion because of transportation issues. At this point we will schedule him for cardioversion next week.   We'll plan on admitting him to the hospital for observation following the cardia version. We've tried to arrange for family or friends to be with him that he has no one who can be with him after the cardia version. We've considered getting a taxi to  take him home but this also is not consistent with the current policy. Only option is to admit him to the hospital for observation after the cardioversion.     We'll schedule for next Tuesday. We'll check pre-cardioversion labs today.  2. Hypothyroidism - continue meds.  He needs to follow up with his medical doctor  3. Diabetes mellitus  4. Hyperlipidemia  5. Hypertension - BP is well controlled.   6. Chronic pain    Nahser, Wonda Cheng, MD  01/28/2015 12:45 PM    [facility],  [place], [place]  [address] Pager [phone] Phone: [phone]; Fax: [phone]   [facility]  [address] [place],   [address] [phone]   Fax [phone]

## 2015-01-28 NOTE — Patient Instructions (Signed)
Medication Instructions:  Your physician recommends that you continue on your current medications as directed. Please refer to the Current Medication list given to you today.   Labwork: TODAY - CBC, BMET   Testing/Procedures: Your physician has recommended that you have a Cardioversion (DCCV). Electrical Cardioversion uses a jolt of electricity to your heart either through paddles or wired patches attached to your chest. This is a controlled, usually prescheduled, procedure. Defibrillation is done under light anesthesia in the hospital, and you usually go home the day of the procedure. This is done to get your heart back into a normal rhythm. You are not awake for the procedure. Please see the instruction sheet given to you today.   Follow-Up: Your physician wants you to follow-up in: 6 months with Dr. Acie Fredrickson.  You will receive a reminder letter in the mail two months in advance. If you don't receive a letter, please call our office to schedule the follow-up appointment.

## 2015-01-30 ENCOUNTER — Other Ambulatory Visit: Payer: Self-pay

## 2015-01-30 ENCOUNTER — Emergency Department (HOSPITAL_COMMUNITY): Payer: Medicaid Other

## 2015-01-30 ENCOUNTER — Encounter (HOSPITAL_COMMUNITY): Payer: Self-pay | Admitting: Emergency Medicine

## 2015-01-30 ENCOUNTER — Emergency Department (HOSPITAL_COMMUNITY)
Admission: EM | Admit: 2015-01-30 | Discharge: 2015-01-30 | Disposition: A | Discharge status: Home / Self Care | Payer: Medicaid Other | Attending: Emergency Medicine | Admitting: Emergency Medicine

## 2015-01-30 DIAGNOSIS — F419 Anxiety disorder, unspecified: Secondary | ICD-10-CM | POA: Diagnosis not present

## 2015-01-30 DIAGNOSIS — Z79899 Other long term (current) drug therapy: Secondary | ICD-10-CM | POA: Insufficient documentation

## 2015-01-30 DIAGNOSIS — I4891 Unspecified atrial fibrillation: Secondary | ICD-10-CM | POA: Diagnosis not present

## 2015-01-30 DIAGNOSIS — I129 Hypertensive chronic kidney disease with stage 1 through stage 4 chronic kidney disease, or unspecified chronic kidney disease: Secondary | ICD-10-CM | POA: Insufficient documentation

## 2015-01-30 DIAGNOSIS — G8929 Other chronic pain: Secondary | ICD-10-CM | POA: Insufficient documentation

## 2015-01-30 DIAGNOSIS — R0602 Shortness of breath: Secondary | ICD-10-CM | POA: Diagnosis present

## 2015-01-30 DIAGNOSIS — Z8619 Personal history of other infectious and parasitic diseases: Secondary | ICD-10-CM | POA: Diagnosis not present

## 2015-01-30 DIAGNOSIS — K219 Gastro-esophageal reflux disease without esophagitis: Secondary | ICD-10-CM | POA: Insufficient documentation

## 2015-01-30 DIAGNOSIS — E119 Type 2 diabetes mellitus without complications: Secondary | ICD-10-CM | POA: Insufficient documentation

## 2015-01-30 DIAGNOSIS — E785 Hyperlipidemia, unspecified: Secondary | ICD-10-CM | POA: Diagnosis not present

## 2015-01-30 DIAGNOSIS — N182 Chronic kidney disease, stage 2 (mild): Secondary | ICD-10-CM | POA: Insufficient documentation

## 2015-01-30 DIAGNOSIS — Z85820 Personal history of malignant melanoma of skin: Secondary | ICD-10-CM | POA: Diagnosis not present

## 2015-01-30 DIAGNOSIS — J441 Chronic obstructive pulmonary disease with (acute) exacerbation: Secondary | ICD-10-CM | POA: Insufficient documentation

## 2015-01-30 DIAGNOSIS — M7989 Other specified soft tissue disorders: Secondary | ICD-10-CM | POA: Insufficient documentation

## 2015-01-30 DIAGNOSIS — Z794 Long term (current) use of insulin: Secondary | ICD-10-CM | POA: Diagnosis not present

## 2015-01-30 DIAGNOSIS — Z720 Tobacco use: Secondary | ICD-10-CM | POA: Insufficient documentation

## 2015-01-30 DIAGNOSIS — E039 Hypothyroidism, unspecified: Secondary | ICD-10-CM | POA: Insufficient documentation

## 2015-01-30 DIAGNOSIS — R0600 Dyspnea, unspecified: Secondary | ICD-10-CM

## 2015-01-30 LAB — BASIC METABOLIC PANEL
ANION GAP: 7 (ref 5–15)
BUN: 27 mg/dL — ABNORMAL HIGH (ref 6–20)
CALCIUM: 8.1 mg/dL — AB (ref 8.9–10.3)
CHLORIDE: 107 mmol/L (ref 101–111)
CO2: 24 mmol/L (ref 22–32)
CREATININE: 2.57 mg/dL — AB (ref 0.61–1.24)
GFR calc non Af Amer: 26 mL/min — ABNORMAL LOW (ref >60)
GFR, EST AFRICAN AMERICAN: 30 mL/min — AB (ref >60)
Glucose, Bld: 156 mg/dL — ABNORMAL HIGH (ref 65–99)
Potassium: 4.5 mmol/L (ref 3.5–5.1)
SODIUM: 138 mmol/L (ref 135–145)

## 2015-01-30 LAB — TROPONIN I

## 2015-01-30 LAB — CBC WITH DIFFERENTIAL/PLATELET
Basophils Absolute: 0 10*3/uL (ref 0.0–0.1)
Basophils Relative: 0 % (ref 0–1)
EOS ABS: 0.2 10*3/uL (ref 0.0–0.7)
EOS PCT: 3 % (ref 0–5)
HEMATOCRIT: 34.1 % — AB (ref 39.0–52.0)
Hemoglobin: 11.6 g/dL — ABNORMAL LOW (ref 13.0–17.0)
LYMPHS PCT: 37 % (ref 12–46)
Lymphs Abs: 2.9 10*3/uL (ref 0.7–4.0)
MCH: 29.3 pg (ref 26.0–34.0)
MCHC: 34 g/dL (ref 30.0–36.0)
MCV: 86.1 fL (ref 78.0–100.0)
Monocytes Absolute: 0.5 10*3/uL (ref 0.1–1.0)
Monocytes Relative: 7 % (ref 3–12)
NEUTROS ABS: 4 10*3/uL (ref 1.7–7.7)
Neutrophils Relative %: 53 % (ref 43–77)
Platelets: 188 10*3/uL (ref 150–400)
RBC: 3.96 MIL/uL — AB (ref 4.22–5.81)
RDW: 14.9 % (ref 11.5–15.5)
WBC: 7.6 10*3/uL (ref 4.0–10.5)

## 2015-01-30 LAB — PROTIME-INR
INR: 2.62 — ABNORMAL HIGH (ref 0.00–1.49)
Prothrombin Time: 27.6 seconds — ABNORMAL HIGH (ref 11.6–15.2)

## 2015-01-30 LAB — BRAIN NATRIURETIC PEPTIDE: B NATRIURETIC PEPTIDE 5: 416.6 pg/mL — AB (ref 0.0–100.0)

## 2015-01-30 MED ORDER — FUROSEMIDE 10 MG/ML IJ SOLN
20.0000 mg | Freq: Once | INTRAMUSCULAR | Status: AC
  Administered 2015-01-30: 20 mg via INTRAVENOUS
  Filled 2015-01-30: qty 2

## 2015-01-30 MED ORDER — IPRATROPIUM-ALBUTEROL 0.5-2.5 (3) MG/3ML IN SOLN
3.0000 mL | Freq: Once | RESPIRATORY_TRACT | Status: AC
  Administered 2015-01-30: 3 mL via RESPIRATORY_TRACT
  Filled 2015-01-30: qty 3

## 2015-01-30 MED ORDER — ALBUTEROL SULFATE HFA 108 (90 BASE) MCG/ACT IN AERS
2.0000 | INHALATION_SPRAY | RESPIRATORY_TRACT | Status: DC | PRN
  Administered 2015-01-30: 2 via RESPIRATORY_TRACT
  Filled 2015-01-30: qty 6.7

## 2015-01-30 MED ORDER — OXYCODONE-ACETAMINOPHEN 5-325 MG PO TABS
2.0000 | ORAL_TABLET | Freq: Once | ORAL | Status: AC
  Administered 2015-01-30: 2 via ORAL
  Filled 2015-01-30: qty 2

## 2015-01-30 NOTE — ED Provider Notes (Addendum)
CSN: [id number]     Arrival date & time 01/30/15  1913 History   First MD Initiated Contact with Patient 01/30/15 1922     Chief Complaint  Patient presents with  . Shortness of Breath     (Consider location/radiation/quality/duration/timing/severity/associated sxs/prior Treatment) HPI Comments: Patient awoke with shortness of breath since this morning. Feels better after getting oxygen by EMS. History of atrial fibrillation scheduled for cardioversion 4 days. Denies any chest pain. States he has a history of heart failure as well as well. He takes 80-160 mg of Lasix daily. Today he took 160 without relief. He denies any chest pain, nausea or vomiting. He states compliance with his other medications. He feels his leg swelling is worse than usual. He has difficulty lying flat at baseline. This is not changed today. He is scheduled to have a cardioversion next week. He states compliance with his Coumadin. Has a history of COPD as well, did not use any nebulizers at home today  The history is provided by the patient.    Past Medical History  Diagnosis Date  . PAF (paroxysmal atrial fibrillation)   . Diabetes mellitus   . Hypothyroidism   . Hyperlipidemia   . Hypertension   . Chronic pain   . OA (osteoarthritis)   . COPD (chronic obstructive pulmonary disease)   . RA (rheumatoid arthritis)   . Hepatitis C   . Venous stasis   . Bipolar 1 disorder   . Hepatitis C   . Fibromyalgia   . Asthma   . Anxiety   . Melanoma     face, shoulder arm  . Pancreatitis, acute 2015  . GERD (gastroesophageal reflux disease)   . CKD (chronic kidney disease), stage II    Past Surgical History  Procedure Laterality Date  . Thyroidectomy    . Tonsillectomy    . US echocardiography  02/19/2010    EF 55-60%  . Skin cancer excision      melonoma  . Fracture surgery Left     arm  . Submandibular gland excision Left 12/25/2013    w/sialolithotomy  . Inguinal hernia repair Bilateral   . Hernia repair       umbicial  . Knee surgery      /H&P 02/20/2010  . Submandibular gland excision Left 12/25/2013    Procedure: SUBMANDIBULAR GLAND STONE AND CHRONIC SIALOLITHIASIS EXCISION;  Surgeon: Melida Quitter, MD;  Location: [facility];  Service: ENT;  Laterality: Left;  . Amputation Left 11/11/2014    Procedure: FOOT, SECOND [place] AMPUTATION;  Surgeon: Newt Minion, MD;  Location: [facility];  Service: Orthopedics;  Laterality: Left;   Family History  Problem Relation Age of Onset  . Hypertension Mother   . Alzheimer's disease Mother   . Heart disease Father    History  Substance Use Topics  . Smoking status: Current Every Day Smoker -- 1.00 packs/day for 43 years  . Smokeless tobacco: Never Used  . Alcohol Use: Yes    Review of Systems  Constitutional: Positive for activity change, appetite change and fatigue. Negative for fever.  HENT: Negative for congestion and rhinorrhea.   Respiratory: Positive for shortness of breath. Negative for cough and chest tightness.   Cardiovascular: Positive for leg swelling. Negative for chest pain.  Gastrointestinal: Negative for nausea, vomiting and abdominal pain.  Genitourinary: Negative for dysuria, urgency and hematuria.  Musculoskeletal: Negative for myalgias and arthralgias.  Skin: Negative for wound.  Neurological: Positive for weakness. Negative for dizziness and headaches.  A complete 10 system review of systems was obtained and all systems are negative except as noted in the HPI and PMH.      Allergies  Novolog; Antihistamines, chlorpheniramine-type; Iodinated diagnostic agents; Lidocaine; Other; Pentazocine lactate; Sulfa antibiotics; and Vicodin  Home Medications   Prior to Admission medications   Medication Sig Start Date End Date Taking? Authorizing Provider  albuterol (PROVENTIL HFA;VENTOLIN HFA) 108 (90 BASE) MCG/ACT inhaler Inhale 2 puffs into the lungs every 4 (four) hours as needed for wheezing or shortness of breath.    Historical Provider, MD   calcitRIOL (ROCALTROL) 0.25 MCG capsule Take 1 capsule (0.25 mcg total) by mouth daily. Patient not taking: Reported on 01/29/2015 11/17/14   Delfina Redwood, MD  COUMADIN 10 MG tablet Take 1 tablet (10 mg total) by mouth as directed. Patient taking differently: Take 10 mg by mouth See admin instructions. Take 10 mg by mouth on Monday, Tuesday, Wednesday, Friday and Saturday. 11/28/14   Thayer Headings, MD  COUMADIN 7.5 MG tablet Take 1 tablet (7.5 mg total) by mouth as directed. Patient taking differently: Take 7.5 mg by mouth 2 (two) times a week. Take 7.5mg  by mouth on Sunday and Thursday at [facility] 08/20/14   Thayer Headings, MD  diazepam (VALIUM) 10 MG tablet Take 10 mg by mouth 3 (three) times daily. For anxiety    Historical Provider, MD  Elbasvir-Grazoprevir (ZEPATIER) 50-100 MG TABS Take 1 tablet by mouth daily. Patient not taking: Reported on 01/29/2015 01/22/15   Hyman Bible, PA-C  fluticasone [facility]) 50 MCG/ACT nasal spray Place 1 spray into both nostrils daily as needed for allergies.     Historical Provider, MD  furosemide (LASIX) 80 MG tablet Take 2 tablets (160 mg total) by mouth 2 (two) times daily. Patient taking differently: Take 80-160 mg by mouth daily. Take 80 mg by mouth in the morning and take 80mg  extra in the morning as needed 01/22/15   Hyman Bible, PA-C  Insulin Glargine (LANTUS SOLOSTAR) 100 UNIT/ML Solostar Pen Inject 28 Units into the skin daily at 10 pm. 01/22/15   Hyman Bible, PA-C  levothyroxine (SYNTHROID) 88 MCG tablet Take 1 tablet (88 mcg total) by mouth daily before breakfast. 01/22/15   Hyman Bible, PA-C  lisinopril (PRINIVIL,ZESTRIL) 40 MG tablet Take 40 mg by mouth daily.    Historical Provider, MD  metoprolol (LOPRESSOR) 50 MG tablet Take 1 tablet (50 mg total) by mouth 2 (two) times daily. 12/09/14   Thayer Headings, MD  Multiple Vitamin (MULTIVITAMIN) tablet Take 1 tablet by mouth every morning.     Historical Provider, MD  omeprazole (PRILOSEC) 40  MG capsule Take 1 capsule (40 mg total) by mouth daily. 08/28/14   Lance Bosch, NP  oxycodone (ROXICODONE) 30 MG immediate release tablet Take 1 tablet (30 mg total) by mouth every 6 (six) hours as needed for pain. Patient taking differently: Take 30-60 mg by mouth 5 (five) times daily as needed for pain.  11/17/14   Delfina Redwood, MD  simvastatin (ZOCOR) 20 MG tablet Take 1 tablet (20 mg total) by mouth daily. Patient taking differently: Take 20 mg by mouth daily at 6 PM.  08/28/14   Lance Bosch, NP  tiotropium (SPIRIVA HANDIHALER) 18 MCG inhalation capsule Place 1 capsule (18 mcg total) into inhaler and inhale daily. 01/22/15   Heather Laisure, PA-C   BP 141/110 mmHg  Pulse 84  Temp(Src) 97.9 F (36.6 C) (Oral)  Resp 18  SpO2 99%  Physical Exam  Constitutional: He is oriented to person, place, and time. He appears well-developed and well-nourished. No distress.  HENT:  Head: Normocephalic and atraumatic.  Mouth/Throat: Oropharynx is clear and moist. No oropharyngeal exudate.  Eyes: Conjunctivae and EOM are normal. Pupils are equal, round, and reactive to light.  Neck: Normal range of motion. Neck supple.  No meningismus.  Cardiovascular: Normal rate, normal heart sounds and intact distal pulses.   No murmur heard. Irregular rhythm  Pulmonary/Chest: Effort normal. No respiratory distress. He has wheezes.  Scattered rhonchi and expiratory wheezing.  Abdominal: Soft. There is no tenderness. There is no rebound and no guarding.  Musculoskeletal: Normal range of motion. He exhibits edema. He exhibits no tenderness.  Trace pedal edema bilaterally  Neurological: He is alert and oriented to person, place, and time. No cranial nerve deficit. He exhibits normal muscle tone. Coordination normal.  No ataxia on finger to nose bilaterally. No pronator drift. 5/5 strength throughout. CN 2-12 intact. Negative Romberg. Equal grip strength. Sensation intact. Gait is normal.   Skin: Skin is warm.   Psychiatric: He has a normal mood and affect. His behavior is normal.  Nursing note and vitals reviewed.   ED Course  Procedures (including critical care time) Labs Review Labs Reviewed  CBC WITH DIFFERENTIAL/PLATELET - Abnormal; Notable for the following:    RBC 3.96 (*)    Hemoglobin 11.6 (*)    HCT 34.1 (*)    All other components within normal limits  BASIC METABOLIC PANEL - Abnormal; Notable for the following:    Glucose, Bld 156 (*)    BUN 27 (*)    Creatinine, Ser 2.57 (*)    Calcium 8.1 (*)    GFR calc non Af Amer 26 (*)    GFR calc Af Amer 30 (*)    All other components within normal limits  BRAIN NATRIURETIC PEPTIDE - Abnormal; Notable for the following:    B Natriuretic Peptide 416.6 (*)    All other components within normal limits  PROTIME-INR - Abnormal; Notable for the following:    Prothrombin Time 27.6 (*)    INR 2.62 (*)    All other components within normal limits  TROPONIN I    Imaging Review Dg Chest 2 View  01/30/2015   CLINICAL DATA:  COPD shortness of breath today, history of diabetes  EXAM: CHEST  2 VIEW  COMPARISON:  12/28/2014  FINDINGS: Moderate cardiac silhouette enlargement stable. Mild vascular congestion stable. Mild diffuse interstitial prominence similar to prior study. No consolidation or effusion. Bilateral peribronchial cuffing noted.  IMPRESSION: Similar to prior study there is evidence of cardiac enlargement probably with mild associated cardiogenic interstitial pulmonary edema.   Electronically Signed   By: Skipper Cliche M.D.   On: 01/30/2015 19:55     EKG Interpretation   Date/Time:  Friday January 30 2015 19:19:53 EDT Ventricular Rate:  85 PR Interval:    QRS Duration: 96 QT Interval:  399 QTC Calculation: 474 R Axis:   53 Text Interpretation:  Atrial fibrillation Baseline wander in lead(s) V1 No  significant change was found Confirmed by Wyvonnia Dusky  MD, Slyvester Latona [phone]) on  01/30/2015 7:23:53 PM      MDM   Final diagnoses:   Dyspnea  Atrial fibrillation, unspecified   1 day of shortness of breath with history of COPD, CHF, atrial fibrillation. Patient in no distress. EKG is unchanged.  Labs appear to be at baseline. Chest x-Maxime shows minimal interstitial edema similar to previous. BNP  400. Cr at baseline.  Patient given dose of IV Lasix in the ED. INR 2.6. Doubt PE or DVT. He does not appear overtly volume overloaded.  Last echo with normal EF.  Nebs given in the ED, albuterol refilled.  Ambulatory without desaturation and feeling better.  Having full conversations with anyone he meets in the hallway.  Denies chest pain. Breathing comfortably on room air. Appears stable for discharge.  Dyspnea likely combination of COPD and diastolic CHF. Has followup with cardiology next week. Return precautions discussed.    Ezequiel Essex, MD 01/31/15 [place], MD 01/31/15 1031

## 2015-01-30 NOTE — ED Notes (Signed)
Patient complains of SOB starting this morning. Patient states " I feel better with Ox" Patient placed on 2L with EMS. LLE edema noted. Patient Hx of A-fib Patient states he is going to get a Cardioversion on Tuesday. Patient states he took his lasix today 80mg . Patient Alert and Oriented.  Patient denies any Chest pain n/v.

## 2015-01-30 NOTE — Discharge Instructions (Signed)
Shortness of Breath Followup with Dr. Johnsie Cancel. Return to the ED if you develop new or worsening symptoms. Shortness of breath means you have trouble breathing. It could also mean that you have a medical problem. You should get immediate medical care for shortness of breath. CAUSES   Not enough oxygen in the air such as with high altitudes or a smoke-filled room.  Certain lung diseases, infections, or problems.  Heart disease or conditions, such as angina or heart failure.  Low red blood cells (anemia).  Poor physical fitness, which can cause shortness of breath when you exercise.  Chest or back injuries or stiffness.  Being overweight.  Smoking.  Anxiety, which can make you feel like you are not getting enough air. DIAGNOSIS  Serious medical problems can often be found during your physical exam. Tests may also be done to determine why you are having shortness of breath. Tests may include:  Chest X-rays.  Lung function tests.  Blood tests.  An electrocardiogram (ECG).  An ambulatory electrocardiogram. An ambulatory ECG records your heartbeat patterns over a 24-hour period.  Exercise testing.  A transthoracic echocardiogram (TTE). During echocardiography, sound waves are used to evaluate how blood flows through your heart.  A transesophageal echocardiogram (TEE).  Imaging scans. Your health care provider may not be able to find a cause for your shortness of breath after your exam. In this case, it is important to have a follow-up exam with your health care provider as directed.  TREATMENT  Treatment for shortness of breath depends on the cause of your symptoms and can vary greatly. HOME CARE INSTRUCTIONS   Do not smoke. Smoking is a common cause of shortness of breath. If you smoke, ask for help to quit.  Avoid being around chemicals or things that may bother your breathing, such as paint fumes and dust.  Rest as needed. Slowly resume your usual activities.  If  medicines were prescribed, take them as directed for the full length of time directed. This includes oxygen and any inhaled medicines.  Keep all follow-up appointments as directed by your health care provider. SEEK MEDICAL CARE IF:   Your condition does not improve in the time expected.  You have a hard time doing your normal activities even with rest.  You have any new symptoms. SEEK IMMEDIATE MEDICAL CARE IF:   Your shortness of breath gets worse.  You feel light-headed, faint, or develop a cough not controlled with medicines.  You start coughing up blood.  You have pain with breathing.  You have chest pain or pain in your arms, shoulders, or abdomen.  You have a fever.  You are unable to walk up stairs or exercise the way you normally do. MAKE SURE YOU:  Understand these instructions.  Will watch your condition.  Will get help right away if you are not doing well or get worse. Document Released: 03/08/2001 Document Revised: 06/18/2013 Document Reviewed: 08/29/2011 [facility] Patient Information 2015 [place], [place]. This information is not intended to replace advice given to you by your health care provider. Make sure you discuss any questions you have with your health care provider.

## 2015-02-02 ENCOUNTER — Other Ambulatory Visit: Payer: Self-pay | Admitting: Cardiovascular Disease

## 2015-02-03 ENCOUNTER — Ambulatory Visit (HOSPITAL_COMMUNITY): Payer: Medicaid Other | Admitting: Anesthesiology

## 2015-02-03 ENCOUNTER — Encounter (HOSPITAL_COMMUNITY)
Admission: RE | Disposition: A | Discharge status: Home / Self Care | Payer: Self-pay | Source: Ambulatory Visit | Attending: Cardiovascular Disease

## 2015-02-03 ENCOUNTER — Observation Stay (HOSPITAL_COMMUNITY)
Admission: RE | Admit: 2015-02-03 | Discharge: 2015-02-04 | Disposition: A | Discharge status: Home / Self Care | Payer: Medicaid Other | Source: Ambulatory Visit | Attending: Cardiovascular Disease | Admitting: Cardiovascular Disease

## 2015-02-03 ENCOUNTER — Encounter (HOSPITAL_COMMUNITY): Payer: Self-pay | Admitting: *Deleted

## 2015-02-03 DIAGNOSIS — M069 Rheumatoid arthritis, unspecified: Secondary | ICD-10-CM | POA: Insufficient documentation

## 2015-02-03 DIAGNOSIS — M1990 Unspecified osteoarthritis, unspecified site: Secondary | ICD-10-CM | POA: Diagnosis not present

## 2015-02-03 DIAGNOSIS — G8929 Other chronic pain: Secondary | ICD-10-CM | POA: Insufficient documentation

## 2015-02-03 DIAGNOSIS — Z885 Allergy status to narcotic agent status: Secondary | ICD-10-CM | POA: Diagnosis not present

## 2015-02-03 DIAGNOSIS — I509 Heart failure, unspecified: Secondary | ICD-10-CM | POA: Diagnosis not present

## 2015-02-03 DIAGNOSIS — Z882 Allergy status to sulfonamides status: Secondary | ICD-10-CM | POA: Insufficient documentation

## 2015-02-03 DIAGNOSIS — I252 Old myocardial infarction: Secondary | ICD-10-CM | POA: Diagnosis not present

## 2015-02-03 DIAGNOSIS — B1920 Unspecified viral hepatitis C without hepatic coma: Secondary | ICD-10-CM | POA: Diagnosis not present

## 2015-02-03 DIAGNOSIS — E039 Hypothyroidism, unspecified: Secondary | ICD-10-CM | POA: Insufficient documentation

## 2015-02-03 DIAGNOSIS — N182 Chronic kidney disease, stage 2 (mild): Secondary | ICD-10-CM | POA: Insufficient documentation

## 2015-02-03 DIAGNOSIS — F319 Bipolar disorder, unspecified: Secondary | ICD-10-CM | POA: Insufficient documentation

## 2015-02-03 DIAGNOSIS — M797 Fibromyalgia: Secondary | ICD-10-CM | POA: Insufficient documentation

## 2015-02-03 DIAGNOSIS — I480 Paroxysmal atrial fibrillation: Principal | ICD-10-CM | POA: Insufficient documentation

## 2015-02-03 DIAGNOSIS — K219 Gastro-esophageal reflux disease without esophagitis: Secondary | ICD-10-CM | POA: Insufficient documentation

## 2015-02-03 DIAGNOSIS — I4891 Unspecified atrial fibrillation: Secondary | ICD-10-CM | POA: Diagnosis present

## 2015-02-03 DIAGNOSIS — J449 Chronic obstructive pulmonary disease, unspecified: Secondary | ICD-10-CM | POA: Insufficient documentation

## 2015-02-03 DIAGNOSIS — J45909 Unspecified asthma, uncomplicated: Secondary | ICD-10-CM | POA: Insufficient documentation

## 2015-02-03 DIAGNOSIS — Z7901 Long term (current) use of anticoagulants: Secondary | ICD-10-CM | POA: Insufficient documentation

## 2015-02-03 DIAGNOSIS — F419 Anxiety disorder, unspecified: Secondary | ICD-10-CM | POA: Insufficient documentation

## 2015-02-03 DIAGNOSIS — Z85820 Personal history of malignant melanoma of skin: Secondary | ICD-10-CM | POA: Insufficient documentation

## 2015-02-03 DIAGNOSIS — Z79899 Other long term (current) drug therapy: Secondary | ICD-10-CM | POA: Diagnosis not present

## 2015-02-03 DIAGNOSIS — Z888 Allergy status to other drugs, medicaments and biological substances status: Secondary | ICD-10-CM | POA: Diagnosis not present

## 2015-02-03 DIAGNOSIS — F17210 Nicotine dependence, cigarettes, uncomplicated: Secondary | ICD-10-CM | POA: Insufficient documentation

## 2015-02-03 DIAGNOSIS — Z794 Long term (current) use of insulin: Secondary | ICD-10-CM | POA: Diagnosis not present

## 2015-02-03 DIAGNOSIS — E1122 Type 2 diabetes mellitus with diabetic chronic kidney disease: Secondary | ICD-10-CM | POA: Insufficient documentation

## 2015-02-03 DIAGNOSIS — I129 Hypertensive chronic kidney disease with stage 1 through stage 4 chronic kidney disease, or unspecified chronic kidney disease: Secondary | ICD-10-CM | POA: Insufficient documentation

## 2015-02-03 DIAGNOSIS — E785 Hyperlipidemia, unspecified: Secondary | ICD-10-CM | POA: Diagnosis not present

## 2015-02-03 HISTORY — PX: CARDIOVERSION: SHX1299

## 2015-02-03 LAB — GLUCOSE, CAPILLARY
GLUCOSE-CAPILLARY: 96 mg/dL (ref 65–99)
GLUCOSE-CAPILLARY: 130 mg/dL — AB (ref 65–99)
Glucose-Capillary: 106 mg/dL — ABNORMAL HIGH (ref 65–99)

## 2015-02-03 SURGERY — CARDIOVERSION
Anesthesia: General

## 2015-02-03 MED ORDER — PROPOFOL 10 MG/ML IV BOLUS
INTRAVENOUS | Status: DC | PRN
  Administered 2015-02-03: 200 mg via INTRAVENOUS

## 2015-02-03 MED ORDER — ELBASVIR-GRAZOPREVIR 50-100 MG PO TABS: 1.0000 | ORAL_TABLET | Freq: Every day | ORAL | Status: DC

## 2015-02-03 MED ORDER — FLUTICASONE PROPIONATE 50 MCG/ACT NA SUSP: 1.0000 | Freq: Every day | NASAL | Status: DC | PRN

## 2015-02-03 MED ORDER — SODIUM CHLORIDE 0.9 % IV SOLN
INTRAVENOUS | Status: DC | PRN
  Administered 2015-02-03: 14:00:00 via INTRAVENOUS

## 2015-02-03 MED ORDER — NICOTINE 14 MG/24HR TD PT24: 14.0000 mg | MEDICATED_PATCH | Freq: Every day | TRANSDERMAL | Status: DC

## 2015-02-03 MED ORDER — WARFARIN SODIUM 7.5 MG PO TABS: 7.5000 mg | ORAL_TABLET | ORAL | Status: DC

## 2015-02-03 MED ORDER — TIOTROPIUM BROMIDE MONOHYDRATE 18 MCG IN CAPS
18.0000 ug | ORAL_CAPSULE | Freq: Every day | RESPIRATORY_TRACT | Status: DC
  Administered 2015-02-04: 18 ug via RESPIRATORY_TRACT
  Filled 2015-02-03: qty 5

## 2015-02-03 MED ORDER — WARFARIN - PHARMACIST DOSING INPATIENT
Freq: Every day | Status: DC
Start: 2015-02-04 — End: 2015-02-04

## 2015-02-03 MED ORDER — SODIUM CHLORIDE 0.9 % IV SOLN
INTRAVENOUS | Status: DC
  Administered 2015-02-03: 500 mL via INTRAVENOUS

## 2015-02-03 MED ORDER — OXYCODONE HCL 5 MG PO TABS: 30.0000 mg | ORAL_TABLET | Freq: Every day | ORAL | Status: DC | PRN

## 2015-02-03 MED ORDER — ADULT MULTIVITAMIN W/MINERALS CH
1.0000 | ORAL_TABLET | Freq: Every morning | ORAL | Status: DC
  Administered 2015-02-04: 1 via ORAL
  Filled 2015-02-03 (×2): qty 1

## 2015-02-03 MED ORDER — METOPROLOL TARTRATE 50 MG PO TABS
50.0000 mg | ORAL_TABLET | Freq: Two times a day (BID) | ORAL | Status: DC
  Administered 2015-02-03 – 2015-02-04 (×2): 50 mg via ORAL
  Filled 2015-02-03 (×2): qty 1

## 2015-02-03 MED ORDER — NICOTINE 14 MG/24HR TD PT24
14.0000 mg | MEDICATED_PATCH | TRANSDERMAL | Status: DC
  Administered 2015-02-03: 14 mg via TRANSDERMAL
  Filled 2015-02-03: qty 1

## 2015-02-03 MED ORDER — INSULIN GLARGINE 100 UNIT/ML ~~LOC~~ SOLN
28.0000 [IU] | Freq: Every day | SUBCUTANEOUS | Status: DC
  Administered 2015-02-03: 28 [IU] via SUBCUTANEOUS
  Filled 2015-02-03 (×2): qty 0.28

## 2015-02-03 MED ORDER — CALCITRIOL 0.25 MCG PO CAPS
0.2500 ug | ORAL_CAPSULE | Freq: Every day | ORAL | Status: DC
  Administered 2015-02-04: 0.25 ug via ORAL
  Filled 2015-02-03: qty 1

## 2015-02-03 MED ORDER — IPRATROPIUM-ALBUTEROL 0.5-2.5 (3) MG/3ML IN SOLN
3.0000 mL | Freq: Once | RESPIRATORY_TRACT | Status: AC
  Administered 2015-02-03: 3 mL via RESPIRATORY_TRACT

## 2015-02-03 MED ORDER — DIAZEPAM 5 MG PO TABS
10.0000 mg | ORAL_TABLET | Freq: Three times a day (TID) | ORAL | Status: DC
  Administered 2015-02-03 – 2015-02-04 (×3): 10 mg via ORAL
  Filled 2015-02-03 (×3): qty 2

## 2015-02-03 MED ORDER — FUROSEMIDE 80 MG PO TABS
80.0000 mg | ORAL_TABLET | Freq: Every day | ORAL | Status: DC
  Administered 2015-02-04: 160 mg via ORAL
  Filled 2015-02-03: qty 2

## 2015-02-03 MED ORDER — PANTOPRAZOLE SODIUM 40 MG PO TBEC
40.0000 mg | DELAYED_RELEASE_TABLET | Freq: Every day | ORAL | Status: DC
  Administered 2015-02-04: 40 mg via ORAL
  Filled 2015-02-03: qty 1

## 2015-02-03 MED ORDER — LEVOTHYROXINE SODIUM 88 MCG PO TABS
88.0000 ug | ORAL_TABLET | Freq: Every day | ORAL | Status: DC
  Administered 2015-02-04: 88 ug via ORAL
  Filled 2015-02-03: qty 1

## 2015-02-03 MED ORDER — LISINOPRIL 40 MG PO TABS
40.0000 mg | ORAL_TABLET | Freq: Every day | ORAL | Status: DC
  Administered 2015-02-04: 40 mg via ORAL
  Filled 2015-02-03: qty 1

## 2015-02-03 MED ORDER — OXYCODONE HCL 5 MG PO TABS
10.0000 mg | ORAL_TABLET | ORAL | Status: DC | PRN
  Administered 2015-02-03 – 2015-02-04 (×4): 10 mg via ORAL
  Filled 2015-02-03 (×4): qty 2

## 2015-02-03 MED ORDER — ALBUTEROL SULFATE (2.5 MG/3ML) 0.083% IN NEBU: 2.5000 mg | INHALATION_SOLUTION | RESPIRATORY_TRACT | Status: DC | PRN

## 2015-02-03 MED ORDER — WARFARIN SODIUM 10 MG PO TABS
10.0000 mg | ORAL_TABLET | ORAL | Status: DC
  Filled 2015-02-03: qty 1

## 2015-02-03 MED ORDER — SIMVASTATIN 20 MG PO TABS
20.0000 mg | ORAL_TABLET | Freq: Every day | ORAL | Status: DC
  Administered 2015-02-03: 20 mg via ORAL
  Filled 2015-02-03: qty 1

## 2015-02-03 NOTE — Progress Notes (Addendum)
Pharmacist Hildred Alamin contacted to clarify about prn pain medication. Hildred Alamin, Pharmacist said she could not verify medication and to contact MD. Paged Dr. Acie Fredrickson to clarify PRN pain medication orders for patient and CBG checks. Telephone orders received for pain medication. MD also stated patient to be d/c home tomorrow and orders to monitor CBG checks ACHS and no need for insulin sliding scale at this time.

## 2015-02-03 NOTE — Anesthesia Preprocedure Evaluation (Signed)
Anesthesia Evaluation  Patient identified by MRN, date of birth, ID band Patient awake    Reviewed: Allergy & Precautions, NPO status , Patient's Chart, lab work & pertinent test results, reviewed documented beta blocker date and time   History of Anesthesia Complications Negative for: history of anesthetic complications  Airway Mallampati: II  TM Distance: >3 FB Neck ROM: Full    Dental  (+) Poor Dentition, Missing   Pulmonary shortness of breath, COPD COPD inhaler, Current Smoker,  + rhonchi         Cardiovascular hypertension, Pt. on medications and Pt. on home beta blockers - angina+CHF - Past MI + dysrhythmias Atrial Fibrillation Rhythm:Irregular     Neuro/Psych PSYCHIATRIC DISORDERS Anxiety Bipolar Disorder  Neuromuscular disease    GI/Hepatic GERD-  Medicated and Controlled,(+) Hepatitis -, C  Endo/Other  diabetes, Type 2, Insulin DependentHypothyroidism   Renal/GU CRFRenal disease     Musculoskeletal  (+) Arthritis -, Fibromyalgia -  Abdominal   Peds  Hematology negative hematology ROS (+)   Anesthesia Other Findings   Reproductive/Obstetrics                             Anesthesia Physical Anesthesia Plan  ASA: III  Anesthesia Plan: General   Post-op Pain Management:    Induction: Intravenous  Airway Management Planned: Nasal Cannula and Simple Face Mask  Additional Equipment: None  Intra-op Plan:   Post-operative Plan:   Informed Consent: I have reviewed the patients History and Physical, chart, labs and discussed the procedure including the risks, benefits and alternatives for the proposed anesthesia with the patient or authorized representative who has indicated his/her understanding and acceptance.   Dental advisory given  Plan Discussed with: CRNA and Surgeon  Anesthesia Plan Comments:         Anesthesia Quick Evaluation

## 2015-02-03 NOTE — Progress Notes (Signed)
ANTICOAGULATION CONSULT NOTE - Initial Consult  Pharmacy Consult for warfarin Indication: atrial fibrillation  Allergies  Allergen Reactions  . Novolog [Insulin Aspart] Other (See Comments)    "I'm just highly allergic"  . Antihistamines, Chlorpheniramine-Type Other (See Comments)    unknown  . Iodinated Diagnostic Agents Other (See Comments)    Patient states he doesn't know what iodine is and doesn't think he is allergic to it despite it being listed with his allergies  . Lidocaine Itching and Other (See Comments)    Patient is uncertain of this allergy  . Other Hives and Other (See Comments)    Steroid creams  . Pentazocine Lactate Other (See Comments)    unknown  . Sulfa Antibiotics Itching and Other (See Comments)    whelps  . Vicodin [Hydrocodone-Acetaminophen] Palpitations and Other (See Comments)    Doesn't want to take    Patient Measurements:    Vital Signs: Temp: 98.3 F (36.8 C) (08/09 1209) Temp Source: Oral (08/09 1209) BP: 156/87 mmHg (08/09 1450) Pulse Rate: 79 (08/09 1450)  Labs: No results for input(s): HGB, HCT, PLT, APTT, LABPROT, INR, HEPARINUNFRC, CREATININE, CKTOTAL, CKMB, TROPONINI in the last 72 hours.  Estimated Creatinine Clearance: 49.1 mL/min (by C-G formula based on Cr of 2.57).   Medical History: Past Medical History  Diagnosis Date  . PAF (paroxysmal atrial fibrillation)   . Diabetes mellitus   . Hypothyroidism   . Hyperlipidemia   . Hypertension   . Chronic pain   . OA (osteoarthritis)   . COPD (chronic obstructive pulmonary disease)   . RA (rheumatoid arthritis)   . Hepatitis C   . Venous stasis   . Bipolar 1 disorder   . Hepatitis C   . Fibromyalgia   . Asthma   . Anxiety   . Melanoma     face, shoulder arm  . Pancreatitis, acute 2015  . GERD (gastroesophageal reflux disease)   . CKD (chronic kidney disease), stage II      Assessment: 69 yom with afib s/p cardioversion. Pharmacy consulted to dose warfarin for  afib. S/p cardioversion 02/03/15. Hg 11.6, plt wnl on 8/5, INR 2.62 (8/5). Last dose reported taken today (8/9 - 10mg ). No bleed documented  PTA warfarin dose: 7.5mg  Sun&Thurs, 10mg  all other days    Goal of Therapy:  INR 2-3 Monitor platelets by anticoagulation protocol: Yes   Plan:  No more warfarin tonight - patient already took today Continue home dose of warfarin starting tomorrow if INR ok -7.5mg  Sun&Thurs, 10mg  all other days Daily INR Mon s/sx bleeding  Elicia Lamp, PharmD Clinical Pharmacist Pager [phone] 02/03/2015 3:14 PM

## 2015-02-03 NOTE — Transfer of Care (Signed)
Immediate Anesthesia Transfer of Care Note  Patient: Johnny Sims  Procedure(s) Performed: Procedure(s): CARDIOVERSION (N/A)  Patient Location: Endoscopy Unit  Anesthesia Type:MAC  Level of Consciousness: awake, alert  and oriented  Airway & Oxygen Therapy: Patient Spontanous Breathing and Patient connected to nasal cannula oxygen  Post-op Assessment: Report given to RN and Post -op Vital signs reviewed and stable  Post vital signs: Reviewed and stable  Last Vitals:  Filed Vitals:   02/03/15 1209  BP: 153/95  Temp: 36.8 C  Resp: 21    Complications: No apparent anesthesia complications

## 2015-02-03 NOTE — Progress Notes (Signed)
Patient asking for a nicotine patch. Dr. Acie Fredrickson paged and telephone order received for nicotine patch 14mg .

## 2015-02-03 NOTE — Progress Notes (Signed)
Johnny Sims is a 59 y.o. male patient admitted from ED awake, alert - oriented  X 3 - no acute distress noted.  VSS -IV in place, occlusive dsg intact without redness.  Orientation to room, and floor completed with information packet given to patient/family.  Patient declined safety video at this time.  Admission INP armband ID verified with patient/family, and in place.   SR up x 2, fall assessment complete, with patient and family able to verbalize understanding of risk associated with falls, and verbalized understanding to call nsg before up out of bed.  Call light within reach, patient able to voice, and demonstrate understanding.  Skin, clean-dry- intact without evidence of bruising, or skin tears.   No evidence of skin break down noted on exam.  ENDO RN who transferred patient to unit clarified with Dr. Acie Fredrickson who stated patient does not need telemetry.      Will cont to eval and treat per MD orders.  Janalyn Shy, RN 02/03/2015 3:37 PM

## 2015-02-03 NOTE — Interval H&P Note (Signed)
History and Physical Interval Note:  02/03/2015 12:39 PM  Johnny Sims  has presented today for surgery, with the diagnosis of AFIB  The various methods of treatment have been discussed with the patient and family. After consideration of risks, benefits and other options for treatment, the patient has consented to  Procedure(s): CARDIOVERSION (N/A) as a surgical intervention .  The patient's history has been reviewed, patient examined, no change in status, stable for surgery.  I have reviewed the patient's chart and labs.  Questions were answered to the patient's satisfaction.    The patient has agreed to be admitted for observation following the cardioversion . He has no transportation home and lives alone.     Johnny Sims, Wonda Cheng

## 2015-02-03 NOTE — CV Procedure (Signed)
    Cardioversion Note  BRODE SCULLEY [id number] [birth year]  Procedure: DC Cardioversion Indications: atrial fib   Procedure Details Consent: Obtained Time Out: Verified patient identification, verified procedure, site/side was marked, verified correct patient position, special equipment/implants available, Radiology Safety Procedures followed,  medications/allergies/relevent history reviewed, required imaging and test results available.  Performed  The patient has been on adequate anticoagulation.  The patient received Propofol 200 mg iv  for sedation.  Synchronous cardioversion was performed at 200  joules.  The cardioversion was successful     Complications: No apparent complications Patient did tolerate procedure well.   Thayer Headings, Brooke Bonito., MD, [facility] 02/03/2015, 1:58 PM

## 2015-02-03 NOTE — H&P (View-Only) (Signed)
Johnny Sims Date of Birth  [birth year]       [facility] [address], [address], [place], [place]  [address]   [place], [place]  [address] [phone]     [phone]   Fax  [phone]    Fax [phone]  Problem List: 1. Paroxysmal atrial fibrillation 2. Hypothyroidism 3. Diabetes mellitus 4. Hyperlipidemia 5. Hypertension 6. Chronic pain  History of Present Illness:  Johnny Sims is a 59 yo who I have seen in the past for PAF.  His primary medical office is the [place]  office in [place].  He is on his third doctor in the past several years. He is not very  happy about this.  He's having lots of abdominal pain. He's had a poor appetite. He complains of being very thirsty and his reflux water. Also complains of polyuria. He states that his glucose levels have been well controlled.  He has having lots of generalized fatigue. She also is having trouble swallowing and chokes when he eats his cereal in the morning.  He denies any chest pain or shortness breath. He was seen in the emergency room in November, 2013 gallbladder pain. He was noted to be in normal sinus rhythm by EKG at that time.  September 26, 2012:  Johnny Sims has been doing well.  He would like to try Xarelto instead of coumadin.  He is doing well.  No CP.  He is having some problems with his Hep C.    Sept. 11, 2014:  Johnny Sims is seen back for eval of his PAF.  Has had a cough.  Still smoking.    October 14, 2013:  No cardiac complaints today.  Having left face pain due to a salivary gland stone.  Also has had some abdominal pain.   Was found to have ? Gall bladder and possibly pancreatitis problems.    He is walking some - several times a week.  Still smoking.    He thinks that he is taking both generic warfarin and brand name coumadin.   January 28, 2015:  Johnny Sims has developed atrial fib. Was set up for cardioversion but did not have a ride home so he cancelled.    Having some shortness of breath.   Current Outpatient Prescriptions on File Prior to Visit  Medication Sig Dispense Refill  . calcitRIOL (ROCALTROL) 0.25 MCG capsule Take 1 capsule (0.25 mcg total) by mouth daily. 30 capsule 0  . COUMADIN 10 MG tablet Take 1 tablet (10 mg total) by mouth as directed. 30 tablet 1  . COUMADIN 7.5 MG tablet Take 1 tablet (7.5 mg total) by mouth as directed. (Patient taking differently: Take 7.5 mg by mouth as directed. Sunday, Tuesday, Thursday - alternating with 10mg ) 30 tablet 1  . Elbasvir-Grazoprevir (ZEPATIER) 50-100 MG TABS Take 1 tablet by mouth daily. 10 tablet 0  . fluticasone (FLONASE) 50 MCG/ACT nasal spray Place 1 spray into both nostrils daily as needed for allergies.     . furosemide (LASIX) 80 MG tablet Take 2 tablets (160 mg total) by mouth 2 (two) times daily. 40 tablet 0  . Insulin Glargine (LANTUS SOLOSTAR) 100 UNIT/ML Solostar Pen Inject 28 Units into the skin daily at 10 pm. 15 mL 0  . levothyroxine (SYNTHROID) 88 MCG tablet Take 1 tablet (88 mcg total) by mouth daily before breakfast. 10 tablet 0  . Multiple Vitamin (MULTIVITAMIN) tablet Take  1 tablet by mouth every morning.     Marland Kitchen omeprazole (PRILOSEC) 40 MG capsule Take 1 capsule (40 mg total) by mouth daily. 30 capsule 5  . simvastatin (ZOCOR) 20 MG tablet Take 1 tablet (20 mg total) by mouth daily. 30 tablet 5  . tiotropium (SPIRIVA HANDIHALER) 18 MCG inhalation capsule Place 1 capsule (18 mcg total) into inhaler and inhale daily. 30 capsule 0  . diazepam (VALIUM) 10 MG tablet Take 10 mg by mouth 3 (three) times daily. For anxiety    . metoprolol (LOPRESSOR) 50 MG tablet Take 1 tablet (50 mg total) by mouth 2 (two) times daily. (Patient not taking: Reported on 01/28/2015) 180 tablet 3  . oxycodone (ROXICODONE) 30 MG immediate release tablet Take 1 tablet (30 mg total) by mouth every 6 (six) hours as needed for pain. (Patient not taking: Reported on 01/07/2015) 20 tablet 0  . [DISCONTINUED]  Insulin Glulisine (APIDRA) 100 UNIT/ML Solostar Pen Inject 28 Units into the skin at bedtime. 15 mL 11   No current facility-administered medications on file prior to visit.    Allergies  Allergen Reactions  . Antihistamines, Chlorpheniramine-Type Other (See Comments)    unknown  . Iodinated Diagnostic Agents Other (See Comments)    Patient states he doesn't know what iodine is and doesn't think he is allergic to it despite it being listed with his allergies  . Lidocaine Itching    Patient is uncertain of this allergy  . Methadone Other (See Comments)    "I won't take it anymore"  . Novolog [Insulin Aspart]     "I'm just highly allergic"  . Other Hives    Steroid creams  . Pentazocine Lactate Other (See Comments)    unknown  . Sulfa Antibiotics Itching and Other (See Comments)    whelps  . Vicodin [Hydrocodone-Acetaminophen] Palpitations and Other (See Comments)    Doesn't want to take    Past Medical History  Diagnosis Date  . PAF (paroxysmal atrial fibrillation)   . Diabetes mellitus   . Hypothyroidism   . Hyperlipidemia   . Hypertension   . Chronic pain   . OA (osteoarthritis)   . COPD (chronic obstructive pulmonary disease)   . RA (rheumatoid arthritis)   . Hepatitis C   . Venous stasis   . Bipolar 1 disorder   . Hepatitis C   . Fibromyalgia   . Asthma   . Anxiety   . Melanoma     face, shoulder arm  . Pancreatitis, acute 2015  . GERD (gastroesophageal reflux disease)   . CKD (chronic kidney disease), stage II     Past Surgical History  Procedure Laterality Date  . Thyroidectomy    . Tonsillectomy    . US echocardiography  02/19/2010    EF 55-60%  . Skin cancer excision      melonoma  . Fracture surgery Left     arm  . Submandibular gland excision Left 12/25/2013    w/sialolithotomy  . Inguinal hernia repair Bilateral   . Hernia repair      umbicial  . Knee surgery      /H&P 02/20/2010  . Submandibular gland excision Left 12/25/2013    Procedure:  SUBMANDIBULAR GLAND STONE AND CHRONIC SIALOLITHIASIS EXCISION;  Surgeon: Melida Quitter, MD;  Location: [facility];  Service: ENT;  Laterality: Left;  . Amputation Left 11/11/2014    Procedure: FOOT, SECOND Abiel AMPUTATION;  Surgeon: Newt Minion, MD;  Location: [facility];  Service: Orthopedics;  Laterality:  Left;    History  Smoking status  . Current Every Day Smoker -- 1.00 packs/day for 43 years  Smokeless tobacco  . Never Used    History  Alcohol Use  . Yes    Family History  Problem Relation Age of Onset  . Hypertension Mother   . Alzheimer's disease Mother   . Heart disease Father     Reviw of Systems:  Reviewed in the HPI.  All other systems are negative.  Physical Exam: Blood pressure 130/80, pulse 98, height 6\' 9"  (2.057 m), weight 129.275 kg (285 lb), SpO2 97 %. General: Well developed, well nourished, in no acute distress.  Head: Normocephalic, atraumatic, sclera non-icteric, mucus membranes are moist,  Poor dentition  Neck: Supple. Carotids are 2 + without bruits. No JVD   Lungs: bilateral wheezing.   R> L   Heart: Irreg. Irreg.  soft systolic murmur.  Abdomen: Soft, non-tender, non-distended with normal bowel sounds.  Msk:  Strength and tone are normal   Extremities: No clubbing or cyanosis. 1-2 +   edema.  Chronic stasis changes.   Distal pedal pulses are 2+ and equal   Neuro: CN II - XII intact.  Alert and oriented X 3.   Psych:  He has a history of bipolar disease.  ECG:  Assessment / Plan:   1. atrial fibrillation - he's had persistent atrial fibrillation. We had to cancel  his most recent scheduled cardioversion because of transportation issues. At this point we will schedule him for cardioversion next week.   We'll plan on admitting him to the hospital for observation following the cardia version. We've tried to arrange for family or friends to be with him that he has no one who can be with him after the cardia version. We've considered getting a taxi to  take him home but this also is not consistent with the current policy. Only option is to admit him to the hospital for observation after the cardioversion.     We'll schedule for next Tuesday. We'll check pre-cardioversion labs today.  2. Hypothyroidism - continue meds.  He needs to follow up with his medical doctor  3. Diabetes mellitus  4. Hyperlipidemia  5. Hypertension - BP is well controlled.   6. Chronic pain    Noal Abshier, Wonda Cheng, MD  01/28/2015 12:45 PM    [facility] [place],  [place], [place]  [address] Pager [phone] Phone: [phone]; Fax: [phone]   [facility]  [address] [place], [place]  [address] [phone]   Fax [phone]

## 2015-02-03 NOTE — Anesthesia Postprocedure Evaluation (Signed)
  Anesthesia Post-op Note  Patient: Johnny Sims  Procedure(s) Performed: Procedure(s): CARDIOVERSION (N/A)  Patient Location: Endoscopy Unit  Anesthesia Type:MAC  Level of Consciousness: awake, alert  and oriented  Airway and Oxygen Therapy: Patient Spontanous Breathing and Patient connected to nasal cannula oxygen  Post-op Pain: none  Post-op Assessment: Post-op Vital signs reviewed, Patient's Cardiovascular Status Stable and Respiratory Function Stable              Post-op Vital Signs: Reviewed and stable  Last Vitals:  Filed Vitals:   02/03/15 1358  BP: 136/78  Pulse: 86  Temp:   Resp: 29    Complications: No apparent anesthesia complications

## 2015-02-04 ENCOUNTER — Ambulatory Visit (HOSPITAL_COMMUNITY): Payer: Medicaid Other

## 2015-02-04 DIAGNOSIS — I4891 Unspecified atrial fibrillation: Secondary | ICD-10-CM

## 2015-02-04 DIAGNOSIS — I480 Paroxysmal atrial fibrillation: Secondary | ICD-10-CM | POA: Diagnosis not present

## 2015-02-04 LAB — GLUCOSE, CAPILLARY
GLUCOSE-CAPILLARY: 113 mg/dL — AB (ref 65–99)
Glucose-Capillary: 104 mg/dL — ABNORMAL HIGH (ref 65–99)

## 2015-02-04 NOTE — Progress Notes (Signed)
Patient Name: Johnny Sims Date of Encounter: 02/04/2015   SUBJECTIVE  Denies chest pain, sob or palpitation.   CURRENT MEDS . calcitRIOL  0.25 mcg Oral Daily  . diazepam  10 mg Oral TID  . Elbasvir-Grazoprevir  1 tablet Oral Daily  . furosemide  80-160 mg Oral Daily  . insulin glargine  28 Units Subcutaneous Q2200  . levothyroxine  88 mcg Oral QAC breakfast  . lisinopril  40 mg Oral Daily  . metoprolol  50 mg Oral BID  . multivitamin with minerals  1 tablet Oral q morning - 10a  . nicotine  14 mg Transdermal Q24H  . pantoprazole  40 mg Oral Daily  . simvastatin  20 mg Oral q1800  . tiotropium  18 mcg Inhalation Daily  . warfarin  10 mg Oral Once per day on Mon Tue Wed Fri Sat  . [START ON 02/05/2015] warfarin  7.5 mg Oral Once per day on Sun Thu  . Warfarin - Pharmacist Dosing Inpatient   Does not apply q1800    OBJECTIVE  Filed Vitals:   02/03/15 1527 02/03/15 1528 02/03/15 2306 02/04/15 0948  BP:  153/96 139/86 136/95  Pulse:  83 80 85  Temp:  98 F (36.7 C) 99.1 F (37.3 C)   TempSrc:   Oral   Resp:  16 16   Height: 6\' 9"  (2.057 m)     Weight: 284 lb (128.822 kg)     SpO2:  100% 99%     Intake/Output Summary (Last 24 hours) at 02/04/15 1236 Last data filed at 02/03/15 2100  Gross per 24 hour  Intake    950 ml  Output    200 ml  Net    750 ml   Filed Weights   02/03/15 1527  Weight: 284 lb (128.822 kg)    PHYSICAL EXAM  General: Pleasant, NAD. Neuro: Alert and oriented X 3. Moves all extremities spontaneously. Psych: Normal affect. HEENT:  Normal  Neck: Supple without bruits or JVD. Lungs:  Resp regular and unlabored, CTA. Heart: RRR no s3, s4, or murmurs. Abdomen: Soft, non-tender, non-distended, BS + x 4.  Extremities: No clubbing, cyanosis or edema. DP/PT/Radials 2+ and equal bilaterally.    Radiology/Studies  Dg Chest 2 View  01/30/2015   CLINICAL DATA:  COPD shortness of breath today, history of diabetes  EXAM: CHEST  2 VIEW   COMPARISON:  12/28/2014  FINDINGS: Moderate cardiac silhouette enlargement stable. Mild vascular congestion stable. Mild diffuse interstitial prominence similar to prior study. No consolidation or effusion. Bilateral peribronchial cuffing noted.  IMPRESSION: Similar to prior study there is evidence of cardiac enlargement probably with mild associated cardiogenic interstitial pulmonary edema.   Electronically Signed   By: Skipper Cliche M.D.   On: 01/30/2015 19:55    ASSESSMENT AND PLAN Active Problems:   Atrial fibrillation status post cardioversion   - The patient presented yesterday for schedule cardioversion after adequate anticoagulation per Dr. Elmarie Shiley note. Had successful cardioversion at 200 jules. Plan was to discharge him after procedure, however patient requested night stay at hospital because he was schedule to have a [place] of liver/pancreasfor hepatitis/pancreatitis screening per patient.  - Patient did not had a [place] this AM because it was order as outpatient. Now order as outpatient for tomorrow.  - Will discharge patient on current medication regimen. He is no warfarin 7.5mg  Sun&Thurs, 10mg  all other days.    Jarrett Soho PA-C  Pager [phone]  Patient is doing well. Remaining in  NS[facility]. Okay for discharge today.

## 2015-02-04 NOTE — Progress Notes (Addendum)
Travor B Lamarche to be D/C'd Home per MD order.  Discussed with the patient and all questions fully answered.  VSS, Skin clean, dry and intact without evidence of skin break down, no evidence of skin tears noted. IV catheter discontinued intact. Site without signs and symptoms of complications. Dressing and pressure applied.  An After Visit Summary was printed and given to the patient. Patient received prescription.  D/c education completed with patient/family including follow up instructions, medication list, d/c activities limitations if indicated, with other d/c instructions as indicated by MD - patient able to verbalize understanding, all questions fully answered.   Patient instructed to return to ED, call 911, or call MD for any changes in condition.   Patient refused wheelchair escort out.  L'ESPERANCE, Sherill Wegener C 02/04/2015 1:24 PM

## 2015-02-04 NOTE — Discharge Summary (Signed)
Discharge Summary   Patient ID: Johnny Sims,  MRN: [id number], DOB/AGE: 1955-09-22 59 y.o.  Admit date: 02/03/2015 Discharge date: 02/04/2015  Primary Care Provider: Lorayne Marek Primary Cardiologist: Dr. Acie Fredrickson  Discharge Diagnoses Active Problems:   Atrial fibrillation status post cardioversion   Allergies Allergies  Allergen Reactions  . Novolog [Insulin Aspart] Other (See Comments)    "I'm just highly allergic"  . Antihistamines, Chlorpheniramine-Type Other (See Comments)    unknown  . Iodinated Diagnostic Agents Other (See Comments)    Patient states he doesn't know what iodine is and doesn't think he is allergic to it despite it being listed with his allergies  . Lidocaine Itching and Other (See Comments)    Patient is uncertain of this allergy  . Other Hives and Other (See Comments)    Steroid creams  . Pentazocine Lactate Other (See Comments)    unknown  . Sulfa Antibiotics Itching and Other (See Comments)    whelps  . Vicodin [Hydrocodone-Acetaminophen] Palpitations and Other (See Comments)    Doesn't want to take    Procedures  Procedure: DC Cardioversion Indications: atrial fib   Procedure Details Consent: Obtained Time Out: Verified patient identification, verified procedure, site/side was marked, verified correct patient position, special equipment/implants available, Radiology Safety Procedures followed, medications/allergies/relevent history reviewed, required imaging and test results available. Performed  The patient has been on adequate anticoagulation. The patient received Propofol 200 mg iv for sedation. Synchronous cardioversion was performed at 200 joules.  The cardioversion was successful   Complications: No apparent complications Patient did tolerate procedure well.  History of Present Illness  Johnny Sims is a 59 yo who follow's Dr. Acie Fredrickson for  PAF. His primary medical office is the [facility] in [place]. He is on his third  doctor in the past several years. He is not very happy about this.  He's having lots of abdominal pain. He's had a poor appetite. He complains of being very thirsty and his reflux water. Also complains of polyuria. He states that his glucose levels have been well controlled.  He has having lots of generalized fatigue. She also is having trouble swallowing and chokes when he eats his cereal in the morning. He denies any chest pain or shortness breath. He was seen in the emergency room in November, 2013 gallbladder pain. He was noted to be in normal sinus rhythm by EKG at that time.  September 26, 2012: Doing well. He would like to try Xarelto instead of coumadin.No CP. He was having some problems with his Hep C.   Sept. 11, 2014: Seen for eval of his PAF.Still smoking.   October 14, 2013: No cardiac complaints. Haf left face pain due to a salivary gland stone. Also has had some abdominal pain. Was found to have ? Gall bladder and possibly pancreatitis problems.Still smoking.   January 28, 2015: Feveloped atrial fib. Was set up for cardioversion but did not have a ride home so he cancelled.  Having some shortness of breath.   Hospital Course 02/03/15: He was presented to hospital for scheduled cardioversion after adequate anticoagulation. Had successful cardioversion at 200 jules. Plan was to discharge him after procedure, however patient requested night stay at hospital because he was schedule to have a [place] of liver/pancreasfor hepatitis/pancreatitis screening in morning of 8/10 per patient.  - Patient did not had a [place] AM because it was order as outpatient. Now order as outpatient foe 8/11.  - Overnight no chest pain or SOB. Remained in  [facility]. Discharged on current home medication regimen. He was no warfarin 7.5mg  Sun&Thurs, 10mg  all other days. No change in medication was made. No rx given.   Discharge Vitals Blood pressure 136/95, pulse 85, temperature 99.1 F (37.3 C), temperature source  Oral, resp. rate 16, height 6\' 9"  (2.057 m), weight 284 lb (128.822 kg), SpO2 99 %.  Filed Weights   02/03/15 1527  Weight: 284 lb (128.822 kg)    Labs  Disposition  Pt is being discharged home today in good condition.  Follow-up Plans & Appointments  Follow-up Information    Follow up with Ermalinda Barrios, PA-C On 03/09/2015.   Specialty:  Cardiology   Why:  @10 :15 post cardioversion   Contact information:   [address] [place] [address] [phone]           Discharge Instructions    Diet - low sodium heart healthy    Complete by:  As directed      Diet Heart    Complete by:  As directed      Discharge instructions    Complete by:  As directed   Resume all your home medications. No new medications.     Increase activity slowly    Complete by:  As directed      Place in observation (patient's expected length of stay will be less than 2 midnights)    Complete by:  As directed      Vital signs    Complete by:  As directed   Every shift           F/u Labs/Studies: none  Discharge Medications    Medication List    TAKE these medications        albuterol 108 (90 BASE) MCG/ACT inhaler  Commonly known as:  PROVENTIL HFA;VENTOLIN HFA  Inhale 2 puffs into the lungs every 4 (four) hours as needed for wheezing or shortness of breath.     calcitRIOL 0.25 MCG capsule  Commonly known as:  ROCALTROL  Take 1 capsule (0.25 mcg total) by mouth daily.     COUMADIN 7.5 MG tablet  Generic drug:  warfarin  Take 1 tablet (7.5 mg total) by mouth as directed.     COUMADIN 10 MG tablet  Generic drug:  warfarin  Take 1 tablet (10 mg total) by mouth as directed.     diazepam 10 MG tablet  Commonly known as:  VALIUM  Take 10 mg by mouth 3 (three) times daily. For anxiety     Elbasvir-Grazoprevir 50-100 MG Tabs  Commonly known as:  ZEPATIER  Take 1 tablet by mouth daily.     fluticasone 50 MCG/ACT nasal spray  Commonly known as:  FLONASE  Place  1 spray into both nostrils daily as needed for allergies.     furosemide 80 MG tablet  Commonly known as:  LASIX  Take 2 tablets (160 mg total) by mouth 2 (two) times daily.     Insulin Glargine 100 UNIT/ML Solostar Pen  Commonly known as:  LANTUS SOLOSTAR  Inject 28 Units into the skin daily at 10 pm.     levothyroxine 88 MCG tablet  Commonly known as:  SYNTHROID  Take 1 tablet (88 mcg total) by mouth daily before breakfast.     lisinopril 40 MG tablet  Commonly known as:  PRINIVIL,ZESTRIL  Take 40 mg by mouth daily.     metoprolol 50 MG tablet  Commonly known as:  LOPRESSOR  Take 1 tablet (  50 mg total) by mouth 2 (two) times daily.     multivitamin tablet  Take 1 tablet by mouth every morning.     nicotine 14 mg/24hr patch  Commonly known as:  NICODERM CQ - dosed in mg/24 hours  Place 1 patch (14 mg total) onto the skin daily.     omeprazole 40 MG capsule  Commonly known as:  PRILOSEC  Take 1 capsule (40 mg total) by mouth daily.     oxycodone 30 MG immediate release tablet  Commonly known as:  ROXICODONE  Take 1 tablet (30 mg total) by mouth every 6 (six) hours as needed for pain.     simvastatin 20 MG tablet  Commonly known as:  ZOCOR  Take 1 tablet (20 mg total) by mouth daily.     tiotropium 18 MCG inhalation capsule  Commonly known as:  SPIRIVA HANDIHALER  Place 1 capsule (18 mcg total) into inhaler and inhale daily.        Duration of Discharge Encounter   Greater than 30 minutes including physician time.  Signed, Codi Folkerts PA-C 02/04/2015, 1:16 PM

## 2015-02-04 NOTE — Progress Notes (Addendum)
Dr. Acie Fredrickson telephone order okay for patient to leave unit for scheduled ultrasound that was ordered PTA. Patient to be wheeled down by NT. Appointment at 0900.   8:49 AM ultrasound stated they will not take patient who is inpatient if the order was placed as outpatient. Patient made aware and to reschedule appointment

## 2015-02-05 ENCOUNTER — Encounter (HOSPITAL_COMMUNITY): Payer: Self-pay | Admitting: Cardiovascular Disease

## 2015-02-05 ENCOUNTER — Ambulatory Visit (HOSPITAL_COMMUNITY): Admission: RE | Admit: 2015-02-05 | Payer: Medicaid Other | Source: Ambulatory Visit | Admitting: Cardiovascular Disease

## 2015-02-10 ENCOUNTER — Ambulatory Visit (INDEPENDENT_AMBULATORY_CARE_PROVIDER_SITE_OTHER): Payer: Medicaid Other | Admitting: *Deleted

## 2015-02-10 DIAGNOSIS — I480 Paroxysmal atrial fibrillation: Secondary | ICD-10-CM | POA: Diagnosis not present

## 2015-02-10 DIAGNOSIS — Z7901 Long term (current) use of anticoagulants: Secondary | ICD-10-CM

## 2015-02-10 DIAGNOSIS — I4891 Unspecified atrial fibrillation: Secondary | ICD-10-CM | POA: Diagnosis not present

## 2015-02-10 LAB — POCT INR: INR: 2.3

## 2015-02-18 ENCOUNTER — Telehealth: Payer: Self-pay | Admitting: Internal Medicine

## 2015-02-18 NOTE — Telephone Encounter (Signed)
Note made in error

## 2015-02-24 ENCOUNTER — Telehealth (HOSPITAL_COMMUNITY): Payer: Self-pay

## 2015-02-24 NOTE — Telephone Encounter (Signed)
Called to remind pt of 8am appt in radiology on 02/25/15, pt agreed to stay npo after midnight in prep for exam. AW

## 2015-02-25 ENCOUNTER — Ambulatory Visit (HOSPITAL_COMMUNITY)
Admission: RE | Admit: 2015-02-25 | Discharge: 2015-02-25 | Disposition: A | Discharge status: Home / Self Care | Payer: Medicaid Other | Source: Ambulatory Visit | Attending: Internal Medicine | Admitting: Internal Medicine

## 2015-02-25 ENCOUNTER — Encounter: Payer: Self-pay | Admitting: Pharmacist Clinician (PhC)/ Clinical Pharmacy Specialist

## 2015-02-25 ENCOUNTER — Encounter (HOSPITAL_COMMUNITY): Payer: Self-pay | Admitting: *Deleted

## 2015-02-25 ENCOUNTER — Emergency Department (HOSPITAL_COMMUNITY)
Admission: EM | Admit: 2015-02-25 | Discharge: 2015-02-25 | Disposition: A | Discharge status: Home / Self Care | Payer: Medicaid Other | Attending: Emergency Medicine | Admitting: Emergency Medicine

## 2015-02-25 DIAGNOSIS — Z8739 Personal history of other diseases of the musculoskeletal system and connective tissue: Secondary | ICD-10-CM | POA: Diagnosis not present

## 2015-02-25 DIAGNOSIS — N182 Chronic kidney disease, stage 2 (mild): Secondary | ICD-10-CM | POA: Insufficient documentation

## 2015-02-25 DIAGNOSIS — J449 Chronic obstructive pulmonary disease, unspecified: Secondary | ICD-10-CM | POA: Diagnosis not present

## 2015-02-25 DIAGNOSIS — E119 Type 2 diabetes mellitus without complications: Secondary | ICD-10-CM | POA: Insufficient documentation

## 2015-02-25 DIAGNOSIS — Z8619 Personal history of other infectious and parasitic diseases: Secondary | ICD-10-CM | POA: Diagnosis not present

## 2015-02-25 DIAGNOSIS — Z720 Tobacco use: Secondary | ICD-10-CM | POA: Insufficient documentation

## 2015-02-25 DIAGNOSIS — K219 Gastro-esophageal reflux disease without esophagitis: Secondary | ICD-10-CM | POA: Diagnosis not present

## 2015-02-25 DIAGNOSIS — E785 Hyperlipidemia, unspecified: Secondary | ICD-10-CM | POA: Insufficient documentation

## 2015-02-25 DIAGNOSIS — B1920 Unspecified viral hepatitis C without hepatic coma: Secondary | ICD-10-CM | POA: Insufficient documentation

## 2015-02-25 DIAGNOSIS — E039 Hypothyroidism, unspecified: Secondary | ICD-10-CM | POA: Diagnosis not present

## 2015-02-25 DIAGNOSIS — Z85828 Personal history of other malignant neoplasm of skin: Secondary | ICD-10-CM | POA: Diagnosis not present

## 2015-02-25 DIAGNOSIS — G8929 Other chronic pain: Secondary | ICD-10-CM | POA: Diagnosis not present

## 2015-02-25 DIAGNOSIS — Z7901 Long term (current) use of anticoagulants: Secondary | ICD-10-CM | POA: Insufficient documentation

## 2015-02-25 DIAGNOSIS — Z794 Long term (current) use of insulin: Secondary | ICD-10-CM | POA: Insufficient documentation

## 2015-02-25 DIAGNOSIS — Z7951 Long term (current) use of inhaled steroids: Secondary | ICD-10-CM | POA: Diagnosis not present

## 2015-02-25 DIAGNOSIS — I10 Essential (primary) hypertension: Secondary | ICD-10-CM | POA: Insufficient documentation

## 2015-02-25 DIAGNOSIS — Z79899 Other long term (current) drug therapy: Secondary | ICD-10-CM | POA: Insufficient documentation

## 2015-02-25 DIAGNOSIS — B182 Chronic viral hepatitis C: Secondary | ICD-10-CM

## 2015-02-25 DIAGNOSIS — I129 Hypertensive chronic kidney disease with stage 1 through stage 4 chronic kidney disease, or unspecified chronic kidney disease: Secondary | ICD-10-CM | POA: Insufficient documentation

## 2015-02-25 DIAGNOSIS — Z760 Encounter for issue of repeat prescription: Secondary | ICD-10-CM | POA: Insufficient documentation

## 2015-02-25 DIAGNOSIS — I480 Paroxysmal atrial fibrillation: Secondary | ICD-10-CM | POA: Diagnosis not present

## 2015-02-25 DIAGNOSIS — F419 Anxiety disorder, unspecified: Secondary | ICD-10-CM | POA: Insufficient documentation

## 2015-02-25 MED ORDER — TIOTROPIUM BROMIDE MONOHYDRATE 18 MCG IN CAPS: 18.0000 ug | ORAL_CAPSULE | Freq: Once | RESPIRATORY_TRACT | Status: DC

## 2015-02-25 MED ORDER — INSULIN GLARGINE 100 UNIT/ML ~~LOC~~ SOLN: 28.0000 [IU] | Freq: Every day | SUBCUTANEOUS | Status: DC

## 2015-02-25 MED ORDER — OMEPRAZOLE 40 MG PO CPDR: 40.0000 mg | DELAYED_RELEASE_CAPSULE | Freq: Every day | ORAL | Status: DC

## 2015-02-25 NOTE — Care Management Note (Signed)
Case Management Note  Patient Details  Name: Johnny Sims MRN: [id number] Date of Birth: 1956/06/03  Subjective/Objective:                  59 yo male in ER for medication refill.//Home with significant other  Action/Plan: Follow for disposition needs.   Expected Discharge Date:                 02/25/2015 Expected Discharge Plan:  Home/Self Care  In-House Referral:     Discharge planning Services  CM Consult, Follow-up appt scheduled  Post Acute Care Choice:    Choice offered to:     DME Arranged:    DME Agency:     HH Arranged:    HH Agency:     Status of Service:  Completed, signed off  Medicare Important Message Given:    Date Medicare IM Given:    Medicare IM give by:    Date Additional Medicare IM Given:    Additional Medicare Important Message give by:     If discussed at [place] of Stay Meetings, dates discussed:    Additional Comments: [name] J. Clydene Laming, RN, BSN, [place] [phone] ED CM consulted regarding PCP establishment. Pt presented to [facility] ED today for medication refill. NCM met with pt at bedside; pt confirms not having access to f/u care with PCP. NCM researched chart to find pt PCP is [place].  Discussed with patient importance and benefits of following up PCP, and not utilizing the ED for primary care needs. Pt verbalized understanding and is in agreement. Pt states his [facility] case manager [name] [phone]) is working on changing is PCP. [facility] Brochure given with address, phone number, and the services highlighted. Instructed to go to appointment @ [facility] on 03/06/15 $RemoveB'@1000'[id number]$  until PCP switch complete. Pt verbalized understanding.   Fuller Mandril, RN 02/25/2015, 9:58 AM

## 2015-02-25 NOTE — ED Provider Notes (Signed)
CSN: [id number]     Arrival date & time 02/25/15  1950 History   First MD Initiated Contact with Patient 02/25/15 [phone]     Chief Complaint  Patient presents with  . Medication Refill     (Consider location/radiation/quality/duration/timing/severity/associated sxs/prior Treatment) HPI Comments: Pt comes in with c/o that he needs medication refilled. Denies any problems. He states that he has been trying to find a pcp without any luck  The history is provided by the patient. No language interpreter was used.    Past Medical History  Diagnosis Date  . PAF (paroxysmal atrial fibrillation)   . Diabetes mellitus   . Hypothyroidism   . Hyperlipidemia   . Hypertension   . Chronic pain   . OA (osteoarthritis)   . COPD (chronic obstructive pulmonary disease)   . RA (rheumatoid arthritis)   . Hepatitis C   . Venous stasis   . Bipolar 1 disorder   . Hepatitis C   . Fibromyalgia   . Asthma   . Anxiety   . Melanoma     face, shoulder arm  . Pancreatitis, acute 2015  . GERD (gastroesophageal reflux disease)   . CKD (chronic kidney disease), stage II    Past Surgical History  Procedure Laterality Date  . Thyroidectomy    . Tonsillectomy    . US echocardiography  02/19/2010    EF 55-60%  . Skin cancer excision      melonoma  . Fracture surgery Left     arm  . Submandibular gland excision Left 12/25/2013    w/sialolithotomy  . Inguinal hernia repair Bilateral   . Hernia repair      umbicial  . Knee surgery      /H&P 02/20/2010  . Submandibular gland excision Left 12/25/2013    Procedure: SUBMANDIBULAR GLAND STONE AND CHRONIC SIALOLITHIASIS EXCISION;  Surgeon: Melida Quitter, MD;  Location: [facility];  Service: ENT;  Laterality: Left;  . Amputation Left 11/11/2014    Procedure: FOOT, SECOND [place] AMPUTATION;  Surgeon: Newt Minion, MD;  Location: [facility];  Service: Orthopedics;  Laterality: Left;  . Cardioversion  02/03/2015  . Cardioversion N/A 02/03/2015    Procedure: CARDIOVERSION;   Surgeon: Thayer Headings, MD;  Location: [facility] ENDOSCOPY;  Service: Cardiovascular;  Laterality: N/A;   Family History  Problem Relation Age of Onset  . Hypertension Mother   . Alzheimer's disease Mother   . Heart disease Father    Social History  Substance Use Topics  . Smoking status: Current Every Day Smoker -- 1.00 packs/day for 43 years    Types: Cigarettes  . Smokeless tobacco: Never Used  . Alcohol Use: Yes     Comment: rare    Review of Systems  All other systems reviewed and are negative.     Allergies  Novolog; Antihistamines, chlorpheniramine-type; Iodinated diagnostic agents; Lidocaine; Other; Pentazocine lactate; Sulfa antibiotics; and Vicodin  Home Medications   Prior to Admission medications   Medication Sig Start Date End Date Taking? Authorizing Provider  albuterol (PROVENTIL HFA;VENTOLIN HFA) 108 (90 BASE) MCG/ACT inhaler Inhale 2 puffs into the lungs every 4 (four) hours as needed for wheezing or shortness of breath.    Historical Provider, MD  calcitRIOL (ROCALTROL) 0.25 MCG capsule Take 1 capsule (0.25 mcg total) by mouth daily. Patient not taking: Reported on 01/29/2015 11/17/14   Delfina Redwood, MD  COUMADIN 10 MG tablet Take 1 tablet (10 mg total) by mouth as directed. Patient taking differently: Take 10  mg by mouth See admin instructions. Take 10 mg by mouth on Monday, Tuesday, Wednesday, Friday and Saturday. 11/28/14   Thayer Headings, MD  COUMADIN 7.5 MG tablet Take 1 tablet (7.5 mg total) by mouth as directed. Patient taking differently: Take 7.5 mg by mouth 2 (two) times a week. Take 7.5mg  by mouth on Sunday and Thursday at [facility] 08/20/14   Thayer Headings, MD  diazepam (VALIUM) 10 MG tablet Take 10 mg by mouth 3 (three) times daily. For anxiety    Historical Provider, MD  Elbasvir-Grazoprevir (ZEPATIER) 50-100 MG TABS Take 1 tablet by mouth daily. Patient not taking: Reported on 01/29/2015 01/22/15   Hyman Bible, PA-C  fluticasone Scnetx) 50 MCG/ACT  nasal spray Place 1 spray into both nostrils daily as needed for allergies.     Historical Provider, MD  furosemide (LASIX) 80 MG tablet Take 2 tablets (160 mg total) by mouth 2 (two) times daily. Patient taking differently: Take 80-160 mg by mouth daily. Take 80 mg by mouth in the morning and take 80mg  extra in the morning as needed 01/22/15   Hyman Bible, PA-C  Insulin Glargine (LANTUS SOLOSTAR) 100 UNIT/ML Solostar Pen Inject 28 Units into the skin daily at 10 pm. 01/22/15   Hyman Bible, PA-C  insulin glargine (LANTUS) 100 UNIT/ML injection Inject 0.28 mLs (28 Units total) into the skin at bedtime. 02/25/15   Glendell Docker, NP  levothyroxine (SYNTHROID) 88 MCG tablet Take 1 tablet (88 mcg total) by mouth daily before breakfast. 01/22/15   Hyman Bible, PA-C  lisinopril (PRINIVIL,ZESTRIL) 40 MG tablet Take 40 mg by mouth daily.    Historical Provider, MD  metoprolol (LOPRESSOR) 50 MG tablet Take 1 tablet (50 mg total) by mouth 2 (two) times daily. 12/09/14   Thayer Headings, MD  Multiple Vitamin (MULTIVITAMIN) tablet Take 1 tablet by mouth every morning.     Historical Provider, MD  nicotine (NICODERM CQ - DOSED IN MG/24 HOURS) 14 mg/24hr patch Place 1 patch (14 mg total) onto the skin daily. 02/03/15   Thayer Headings, MD  omeprazole (PRILOSEC) 40 MG capsule Take 1 capsule (40 mg total) by mouth daily. 08/28/14   Lance Bosch, NP  omeprazole (PRILOSEC) 40 MG capsule Take 1 capsule (40 mg total) by mouth daily. 02/25/15   Glendell Docker, NP  oxycodone (ROXICODONE) 30 MG immediate release tablet Take 1 tablet (30 mg total) by mouth every 6 (six) hours as needed for pain. Patient taking differently: Take 30-60 mg by mouth 5 (five) times daily as needed for pain.  11/17/14   Delfina Redwood, MD  simvastatin (ZOCOR) 20 MG tablet Take 1 tablet (20 mg total) by mouth daily. Patient taking differently: Take 20 mg by mouth daily at 6 PM.  08/28/14   Lance Bosch, NP  tiotropium (SPIRIVA  HANDIHALER) 18 MCG inhalation capsule Place 1 capsule (18 mcg total) into inhaler and inhale daily. 01/22/15   Heather Laisure, PA-C  tiotropium (SPIRIVA HANDIHALER) 18 MCG inhalation capsule Place 1 capsule (18 mcg total) into inhaler and inhale once. 02/25/15   Glendell Docker, NP   BP 159/115 mmHg  Pulse 109  Temp(Src) 98.1 F (36.7 C)  Resp 16  SpO2 98% Physical Exam  Constitutional: He is oriented to person, place, and time. He appears well-developed and well-nourished.  HENT:  Head: Normocephalic and atraumatic.  Cardiovascular: Normal rate and regular rhythm.   Pulmonary/Chest: Effort normal and breath sounds normal.  Abdominal: Soft. Bowel sounds are normal.  Musculoskeletal: Normal range of motion.  Neurological: He is alert and oriented to person, place, and time.  Skin: Skin is warm and dry.  Nursing note and vitals reviewed.   ED Course  Procedures (including critical care time) Labs Review Labs Reviewed - No data to display  Imaging Review US Abdomen Complete W/elastography  02/25/2015   CLINICAL DATA:  Hepatitis-C.  History of hypertension and diabetes.  EXAM: ULTRASOUND ABDOMEN COMPLETE  ULTRASOUND HEPATIC ELASTOGRAPHY  TECHNIQUE: Sonography of the upper abdomen was performed. In addition, ultrasound elastography evaluation of the liver was performed. A region of interest was placed within the right lobe of the liver. Following application of a compressive sonographic pulse, shear waves were detected in the adjacent hepatic tissue and the shear wave velocity was calculated. Multiple assessments were performed at the selected site. Median shear wave velocity is correlated to a Metavir fibrosis score.  COMPARISON:  CT of the abdomen and pelvis 07/23/2014  FINDINGS: ULTRASOUND ABDOMEN  Gallbladder: Gallbladder has a normal appearance. Gallbladder wall is 2.7 mm, within normal limits. No stones or pericholecystic fluid. No sonographic Murphy's sign.  Common bile duct: Diameter:  6.6 mm  Liver: Scalloped margin.  Mildly echogenic.  No focal liver lesion.  IVC: No abnormality visualized.  Pancreas: Visualized portion unremarkable.  Spleen: Size and appearance within normal limits.  Right Kidney: Length: 13.6 cm. Echogenicity within normal limits. No mass or hydronephrosis visualized.  Left Kidney: Length: 13.8 cm. Echogenicity within normal limits. No mass or hydronephrosis visualized.  Abdominal aorta: No aneurysm visualized.  Other findings: None.  ULTRASOUND HEPATIC ELASTOGRAPHY  Device: Siemens Helix VTQ  Transducer:  4V1  Patient position: Supine  Number of measurements:  10  Hepatic Segment:  8  Median velocity:   2.61  m/sec  IQR: 0.59  IQR/Median velocity ratio 0.226  Corresponding Metavir fibrosis score:  F3 and some F4  Risk of fibrosis: High  Limitations of exam: Patient had limited ability to follow breathing instructions for the exam. Patient takes Nobolog for Diabetes  Pertinent findings noted on other imaging exams:  None  Please note that abnormal shear wave velocities may also be identified in clinical settings other than with hepatic fibrosis, such as: acute hepatitis, elevated right heart and central venous pressures including use of beta blockers, veno-occlusive disease (Budd-Chiari), infiltrative processes such as mastocytosis/amyloidosis/infiltrative tumor, extrahepatic cholestasis, in the post-prandial state, and liver transplantation. Correlation with patient history, laboratory data, and clinical condition recommended.  IMPRESSION: 1. No evidence for acute cholecystitis. 2. Scalloped margin of the liver. 3. Median hepatic shear wave velocity is calculated at 2.61 m/sec. 4. Corresponding Metavir fibrosis score isF3 and some F4 5. Follow-up:  Recommended   Electronically Signed   By: Nolon Nations M.D.   On: 02/25/2015 09:40   I have personally reviewed and evaluated these images and lab results as part of my medical decision-making.   EKG Interpretation None       MDM   Final diagnoses:  Medication refill    Pt is here to have have medications refilled. Care management came to talk with pt and he is scheduled to see pcp in 10 days    Glendell Docker, NP 02/25/15 1004  Evelina Bucy, MD 02/25/15 1020

## 2015-02-25 NOTE — Progress Notes (Signed)
NCM spoke with  [facility] case manager Markham Jordan [phone]) to confirm she is case manager for pt.  States she advised him to go to PCP rather that ER for medication refills.  She is working on finding PCP that better fits pt needs.

## 2015-02-25 NOTE — Discharge Instructions (Signed)
Medication Refill, Emergency Department °We have refilled your medication today as a courtesy to you. It is best for your medical care, however, to take care of getting refills done through your primary caregiver's office. They have your records and can do a better job of follow-up than we can in the emergency department. °On maintenance medications, we often only prescribe enough medications to get you by until you are able to see your regular caregiver. This is a more expensive way to refill medications. °In the future, please plan for refills so that you will not have to use the emergency department for this. °Thank you for your help. Your help allows us to better take care of the daily emergencies that enter our department. °Document Released: 09/30/2003 Document Revised: 09/05/2011 Document Reviewed: 09/20/2013 °ExitCare® Patient Information ©2015 [employer]. This information is not intended to replace advice given to you by your health care provider. Make sure you discuss any questions you have with your health care provider. ° °

## 2015-02-25 NOTE — ED Notes (Signed)
PT comes today for refill on all of his monthly meds. Pt reports he in between doctors for PCP . PT does have a cardiologist .

## 2015-02-25 NOTE — ED Notes (Signed)
Declined W/C at D/C and was escorted to lobby by RN. 

## 2015-02-26 ENCOUNTER — Emergency Department (HOSPITAL_COMMUNITY)
Admission: EM | Admit: 2015-02-26 | Discharge: 2015-02-26 | Discharge status: Against Medical Advice | Payer: Medicaid Other | Attending: Emergency Medicine | Admitting: Emergency Medicine

## 2015-02-26 ENCOUNTER — Telehealth: Payer: Self-pay | Admitting: *Deleted

## 2015-02-26 ENCOUNTER — Encounter (HOSPITAL_COMMUNITY): Payer: Self-pay | Admitting: Emergency Medicine

## 2015-02-26 ENCOUNTER — Other Ambulatory Visit: Payer: Self-pay | Admitting: Pharmacist Clinician (PhC)/ Clinical Pharmacy Specialist

## 2015-02-26 DIAGNOSIS — Z720 Tobacco use: Secondary | ICD-10-CM | POA: Diagnosis not present

## 2015-02-26 DIAGNOSIS — N182 Chronic kidney disease, stage 2 (mild): Secondary | ICD-10-CM | POA: Insufficient documentation

## 2015-02-26 DIAGNOSIS — J449 Chronic obstructive pulmonary disease, unspecified: Secondary | ICD-10-CM | POA: Diagnosis not present

## 2015-02-26 DIAGNOSIS — E119 Type 2 diabetes mellitus without complications: Secondary | ICD-10-CM | POA: Diagnosis not present

## 2015-02-26 DIAGNOSIS — Y999 Unspecified external cause status: Secondary | ICD-10-CM | POA: Diagnosis not present

## 2015-02-26 DIAGNOSIS — I129 Hypertensive chronic kidney disease with stage 1 through stage 4 chronic kidney disease, or unspecified chronic kidney disease: Secondary | ICD-10-CM | POA: Diagnosis not present

## 2015-02-26 DIAGNOSIS — Y92009 Unspecified place in unspecified non-institutional (private) residence as the place of occurrence of the external cause: Secondary | ICD-10-CM | POA: Insufficient documentation

## 2015-02-26 DIAGNOSIS — G8929 Other chronic pain: Secondary | ICD-10-CM | POA: Diagnosis not present

## 2015-02-26 DIAGNOSIS — S80211A Abrasion, right knee, initial encounter: Secondary | ICD-10-CM | POA: Diagnosis not present

## 2015-02-26 DIAGNOSIS — S81802A Unspecified open wound, left lower leg, initial encounter: Secondary | ICD-10-CM | POA: Insufficient documentation

## 2015-02-26 DIAGNOSIS — Y9389 Activity, other specified: Secondary | ICD-10-CM | POA: Diagnosis not present

## 2015-02-26 MED ORDER — LEDIPASVIR-SOFOSBUVIR 90-400 MG PO TABS: 1.0000 | ORAL_TABLET | Freq: Every day | ORAL | Status: DC

## 2015-02-26 MED ORDER — OMEPRAZOLE 20 MG PO CPDR: 20.0000 mg | DELAYED_RELEASE_CAPSULE | Freq: Every day | ORAL | Status: DC

## 2015-02-26 NOTE — Telephone Encounter (Signed)
NCM called to confirm that pt can have lisinopril refill.  Pharmacist confirms that pt has one more refill before script new script required.  NCM called pt and relayed information to him.  Pt very appreciative.

## 2015-02-26 NOTE — Telephone Encounter (Signed)
Pt called concerned that he did not give refill on lisinopril.  NCM will call his pharmacy ([facility]) to see if refill Rx is needed.

## 2015-02-26 NOTE — Progress Notes (Addendum)
Patient ID: Johnny Sims, male   DOB: 09-16-1955, 59 y.o.   MRN: [id number] [name] just stopped by after his elastography appt this AM. Surprisingly, his fibrosis score is back already. 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 He has had some issue with CRF. Dr Linus Salmons was planning on using Zepatier to treat his Hep C due to the renal insuffiency. However, after calculating his Child Pugh score, he falls under B category. Therefore, Zepatier would be an issue here. After calculation, his CrCl>30 due to his weight. We are going to try for Harvoni instead. He is on prilosec 40mg  so we are going to change it to 20mg  and will tell him to be off if possible.

## 2015-02-26 NOTE — Telephone Encounter (Signed)
Pharmacy called related to RW:CHJSCBI glargine (LANTUS) 100 UNIT/ML injection.NCM clarified with EDP to change Rx to: Insulin Glargine (LANTUS SOLOSTAR) 100 UNIT/ML Solostar Pen   .

## 2015-02-26 NOTE — ED Notes (Signed)
Unable to locate patient for fast track
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
## 2015-02-27 ENCOUNTER — Encounter (HOSPITAL_COMMUNITY): Payer: Self-pay | Admitting: Pharmacy Technician

## 2015-03-06 ENCOUNTER — Encounter: Payer: Self-pay | Admitting: Internal Medicine

## 2015-03-06 ENCOUNTER — Ambulatory Visit: Payer: Medicaid Other | Attending: Internal Medicine | Admitting: Internal Medicine

## 2015-03-06 ENCOUNTER — Telehealth: Payer: Self-pay | Admitting: Internal Medicine

## 2015-03-06 VITALS — BP 124/77 | HR 62 | Temp 98.0°F | Resp 16 | Ht >= 80 in | Wt 293.8 lb

## 2015-03-06 DIAGNOSIS — E785 Hyperlipidemia, unspecified: Secondary | ICD-10-CM | POA: Diagnosis not present

## 2015-03-06 DIAGNOSIS — E079 Disorder of thyroid, unspecified: Secondary | ICD-10-CM | POA: Diagnosis not present

## 2015-03-06 DIAGNOSIS — E1159 Type 2 diabetes mellitus with other circulatory complications: Secondary | ICD-10-CM | POA: Diagnosis not present

## 2015-03-06 DIAGNOSIS — C439 Malignant melanoma of skin, unspecified: Secondary | ICD-10-CM | POA: Diagnosis not present

## 2015-03-06 DIAGNOSIS — M1711 Unilateral primary osteoarthritis, right knee: Secondary | ICD-10-CM

## 2015-03-06 DIAGNOSIS — N184 Chronic kidney disease, stage 4 (severe): Secondary | ICD-10-CM | POA: Insufficient documentation

## 2015-03-06 DIAGNOSIS — E1142 Type 2 diabetes mellitus with diabetic polyneuropathy: Secondary | ICD-10-CM | POA: Insufficient documentation

## 2015-03-06 DIAGNOSIS — I480 Paroxysmal atrial fibrillation: Secondary | ICD-10-CM

## 2015-03-06 DIAGNOSIS — K219 Gastro-esophageal reflux disease without esophagitis: Secondary | ICD-10-CM | POA: Insufficient documentation

## 2015-03-06 DIAGNOSIS — Z794 Long term (current) use of insulin: Secondary | ICD-10-CM | POA: Diagnosis not present

## 2015-03-06 LAB — POCT GLYCOSYLATED HEMOGLOBIN (HGB A1C): Hemoglobin A1C: 6.7

## 2015-03-06 LAB — GLUCOSE, POCT (MANUAL RESULT ENTRY): POC Glucose: 199 mg/dl — AB (ref 70–99)

## 2015-03-06 MED ORDER — LEVOTHYROXINE SODIUM 88 MCG PO TABS: 88.0000 ug | ORAL_TABLET | Freq: Every day | ORAL | Status: AC

## 2015-03-06 MED ORDER — SIMVASTATIN 20 MG PO TABS: 20.0000 mg | ORAL_TABLET | Freq: Every day | ORAL | Status: AC

## 2015-03-06 MED ORDER — METOPROLOL TARTRATE 50 MG PO TABS: 50.0000 mg | ORAL_TABLET | Freq: Two times a day (BID) | ORAL | Status: DC

## 2015-03-06 MED ORDER — OMEPRAZOLE 20 MG PO CPDR: 20.0000 mg | DELAYED_RELEASE_CAPSULE | Freq: Every day | ORAL | Status: AC

## 2015-03-06 MED ORDER — INSULIN GLARGINE 100 UNIT/ML SOLOSTAR PEN: 28.0000 [IU] | PEN_INJECTOR | Freq: Every day | SUBCUTANEOUS | Status: DC

## 2015-03-06 NOTE — Telephone Encounter (Signed)
Patient was just seen today and called stating that pcp did not send over rx to his pharmacy for the following:  diazepam (VALIUM) 10 MG tablet Penicillin or antibiotic for his infected leg.

## 2015-03-06 NOTE — Progress Notes (Signed)
Patient ID: Johnny Sims, male   DOB: Apr 27, 1956, 59 y.o.   MRN: [id number]  CC: medication refills and referrals  HPI: Johnny Sims is a 59 y.o. male here today for a follow up visit.  He has a extensive past medical history for atrial fibrillation, COPD, HTN, Hep C, HLD, melanoma and thyroid cancer at age 39. He presents to clinic today with his main concern of chronic neuropathy pain. He states that he has a history if rheumatoid, diabetic polyneuropathy, and fibromyalgia and has been on high dose oxycodone for several years. He c/o of swelling in his LLE and report that he has had several negative dopplers studies of that extremity and has been told that he has PVD/venous statis. He notes that he has been treated by every large medical center in the area. He does have a cardiologist who is managing his coumadin dosing for atrial fibrillation. Patient reports he had a recent left second toe amputation back in May by Dr. Sharol Given.  Patient complains of chronic bilateral lower extremity neuropathy that extends all the way up to his thighs. Pain is so severe he reports he's been going to the emergency department to get morphine injections. He would like a pain management referral for treatment. He would like to go back [facility] to orthopedic surgeon for her right knee osteoarthritis. Dermatology for reevaluation of melanoma. Review of chart showed the patient has had a gradual increase in creatinine level and a GFR of 29. Patient is requesting referral to nephrology.    Allergies  Allergen Reactions  . Novolog [Insulin Aspart] Other (See Comments)    "I'm just highly allergic"  . Antihistamines, Chlorpheniramine-Type Other (See Comments)    unknown  . Iodinated Diagnostic Agents Other (See Comments)    Patient states he doesn't know what iodine is and doesn't think he is allergic to it despite it being listed with his allergies  . Lidocaine Itching and Other (See Comments)    Patient is  uncertain of this allergy  . Other Hives and Other (See Comments)    Steroid creams  . Pentazocine Lactate Other (See Comments)    unknown  . Sulfa Antibiotics Itching and Other (See Comments)    whelps  . Vicodin [Hydrocodone-Acetaminophen] Palpitations and Other (See Comments)    Doesn't want to take   Past Medical History  Diagnosis Date  . PAF (paroxysmal atrial fibrillation)   . Diabetes mellitus   . Hypothyroidism   . Hyperlipidemia   . Hypertension   . Chronic pain   . OA (osteoarthritis)   . COPD (chronic obstructive pulmonary disease)   . RA (rheumatoid arthritis)   . Hepatitis C   . Venous stasis   . Bipolar 1 disorder   . Hepatitis C   . Fibromyalgia   . Asthma   . Anxiety   . Melanoma     face, shoulder arm  . Pancreatitis, acute 2015  . GERD (gastroesophageal reflux disease)   . CKD (chronic kidney disease), stage II    Current Outpatient Prescriptions on File Prior to Visit  Medication Sig Dispense Refill  . albuterol (PROVENTIL HFA;VENTOLIN HFA) 108 (90 BASE) MCG/ACT inhaler Inhale 2 puffs into the lungs every 4 (four) hours as needed for wheezing or shortness of breath.    . COUMADIN 10 MG tablet Take 1 tablet (10 mg total) by mouth as directed. (Patient taking differently: Take 10 mg by mouth See admin instructions. Take 10 mg by mouth on  Monday, Tuesday, Wednesday, Friday and Saturday.) 30 tablet 1  . COUMADIN 7.5 MG tablet Take 1 tablet (7.5 mg total) by mouth as directed. (Patient taking differently: Take 7.5 mg by mouth 2 (two) times a week. Take 7.5mg  by mouth on Sunday and Thursday at [facility]) 30 tablet 1  . diazepam (VALIUM) 10 MG tablet Take 10 mg by mouth 3 (three) times daily. For anxiety    . furosemide (LASIX) 80 MG tablet Take 2 tablets (160 mg total) by mouth 2 (two) times daily. (Patient taking differently: Take 80-160 mg by mouth daily. Take 80 mg by mouth in the morning and take 80mg  extra in the morning as needed) 40 tablet 0  . Insulin Glargine  (LANTUS SOLOSTAR) 100 UNIT/ML Solostar Pen Inject 28 Units into the skin daily at 10 pm. 15 mL 0  . Ledipasvir-Sofosbuvir (HARVONI) 90-400 MG TABS Take 1 tablet by mouth daily. 28 tablet 2  . levothyroxine (SYNTHROID) 88 MCG tablet Take 1 tablet (88 mcg total) by mouth daily before breakfast. 10 tablet 0  . lisinopril (PRINIVIL,ZESTRIL) 40 MG tablet Take 40 mg by mouth daily.    . metoprolol (LOPRESSOR) 50 MG tablet Take 1 tablet (50 mg total) by mouth 2 (two) times daily. 180 tablet 3  . Multiple Vitamin (MULTIVITAMIN) tablet Take 1 tablet by mouth every morning.     . nicotine (NICODERM CQ - DOSED IN MG/24 HOURS) 14 mg/24hr patch Place 1 patch (14 mg total) onto the skin daily. 28 patch 0  . omeprazole (PRILOSEC) 20 MG capsule Take 1 capsule (20 mg total) by mouth daily. Take with Harvoni at the same time under FASTING condition 30 capsule 11  . oxycodone (ROXICODONE) 30 MG immediate release tablet Take 1 tablet (30 mg total) by mouth every 6 (six) hours as needed for pain. (Patient taking differently: Take 30-60 mg by mouth 5 (five) times daily as needed for pain. ) 20 tablet 0  . simvastatin (ZOCOR) 20 MG tablet Take 1 tablet (20 mg total) by mouth daily. (Patient taking differently: Take 20 mg by mouth daily at 6 PM. ) 30 tablet 5  . tiotropium (SPIRIVA HANDIHALER) 18 MCG inhalation capsule Place 1 capsule (18 mcg total) into inhaler and inhale daily. 30 capsule 0  . calcitRIOL (ROCALTROL) 0.25 MCG capsule Take 1 capsule (0.25 mcg total) by mouth daily. (Patient not taking: Reported on 01/29/2015) 30 capsule 0  . fluticasone (FLONASE) 50 MCG/ACT nasal spray Place 1 spray into both nostrils daily as needed for allergies.     Marland Kitchen insulin glargine (LANTUS) 100 UNIT/ML injection Inject 0.28 mLs (28 Units total) into the skin at bedtime. 10 mL 0  . tiotropium (SPIRIVA HANDIHALER) 18 MCG inhalation capsule Place 1 capsule (18 mcg total) into inhaler and inhale once. 30 capsule 0  . [DISCONTINUED] Insulin  Glulisine (APIDRA) 100 UNIT/ML Solostar Pen Inject 28 Units into the skin at bedtime. 15 mL 11   No current facility-administered medications on file prior to visit.   Family History  Problem Relation Age of Onset  . Hypertension Mother   . Alzheimer's disease Mother   . Heart disease Father    Social History   Social History  . Marital Status: Single    Spouse Name: N/A  . Number of Children: N/A  . Years of Education: N/A   Occupational History  . Not on file.   Social History Main Topics  . Smoking status: Current Every Day Smoker -- 1.00 packs/day for 43 years  Types: Cigarettes  . Smokeless tobacco: Never Used  . Alcohol Use: Yes     Comment: rare  . Drug Use: No  . Sexual Activity: Not on file   Other Topics Concern  . Not on file   Social History Narrative    Review of Systems: Other than what is stated in HPI, all other systems are negative.   Objective:   Filed Vitals:   03/06/15 1023  BP: 144/84  Pulse: 97  Temp: 98 F (36.7 C)  Resp: 16    Physical Exam  Constitutional: He is oriented to person, place, and time.  HENT:  Head:     Suspicious mole  Cardiovascular: Normal rate, regular rhythm, normal heart sounds and intact distal pulses.    Peripheral vascular disease with venous stasis ulcers bilaterally  Pulmonary/Chest: Effort normal and breath sounds normal.  Musculoskeletal: He exhibits edema ( bilateral lower extremity and right knee).  Neurological: He is alert and oriented to person, place, and time.  Skin: Skin is warm and dry.     Lab Results  Component Value Date   WBC 7.6 01/30/2015   HGB 11.6* 01/30/2015   HCT 34.1* 01/30/2015   MCV 86.1 01/30/2015   PLT 188 01/30/2015   Lab Results  Component Value Date   CREATININE 2.57* 01/30/2015   BUN 27* 01/30/2015   NA 138 01/30/2015   K 4.5 01/30/2015   CL 107 01/30/2015   CO2 24 01/30/2015    Lab Results  Component Value Date   HGBA1C 6.70 03/06/2015   Lipid Panel      Component Value Date/Time   CHOL  02/19/2010 0418    114        ATP III CLASSIFICATION:  <200     mg/dL   Desirable  200-239  mg/dL   Borderline High  >=240    mg/dL   High          TRIG 266* 02/19/2010 0418   HDL 28* 02/19/2010 0418   CHOLHDL 4.1 02/19/2010 0418   VLDL 53* 02/19/2010 0418   LDLCALC  02/19/2010 0418    33        Total Cholesterol/HDL:CHD Risk Coronary Heart Disease Risk Table                     Men   Women  1/2 Average Risk   3.4   3.3  Average Risk       5.0   4.4  2 X Average Risk   9.6   7.1  3 X Average Risk  23.4   11.0        Use the calculated Patient Ratio above and the CHD Risk Table to determine the patient's CHD Risk.        ATP III CLASSIFICATION (LDL):  <100     mg/dL   Optimal  100-129  mg/dL   Near or Above                    Optimal  130-159  mg/dL   Borderline  160-189  mg/dL   High  >190     mg/dL   Very High       Assessment and plan:   Cy was seen today for follow-up.  Diagnoses and all orders for this visit:  Paroxysmal atrial fibrillation -      Refill metoprolol (LOPRESSOR) 50 MG tablet; Take 1 tablet (50 mg total) by mouth 2 (two) times daily.  Patient does not appear to be in A. Fib right now may continue follow-up with Coumadin clinic with his cardiologist  Type 2 diabetes mellitus with other circulatory complications -     Glucose (CBG) -     HgB A1c -      RefillInsulin Glargine (LANTUS SOLOSTAR) 100 UNIT/ML Solostar Pen; Inject 28 Units into the skin daily at 10 pm.  diabetes currently controlled.  Went over Low-fat diet, weight loss, exercise.  Chronic kidney disease (CKD), stage IV (severe) -     Ambulatory referral to Nephrology  patient has gradual increase and creatinine and decreasing GFR will refer for consult  Melanoma of skin -     Ambulatory referral to Dermatology   Due to his history I will refer to dermatology for evaluation of mole  HLD (hyperlipidemia) -      Refill simvastatin (ZOCOR)  20 MG tablet; Take 1 tablet (20 mg total) by mouth daily at 6 PM. Education provided on proper lifestyle changes in order to lower cholesterol. Patient advised to maintain healthy weight and to keep total fat intake at 25-35% of total calories and carbohydrates 50-60% of total daily calories. Explained how high cholesterol places patient at risk for heart disease. Patient placed on appropriate medication and repeat labs in 6 months   Thyroid disease -      Refill levothyroxine (SYNTHROID) 88 MCG tablet; Take 1 tablet (88 mcg total) by mouth daily before breakfast. Will send additional refills based off blood level  Diabetic polyneuropathy associated with type 2 diabetes mellitus -     Ambulatory referral to Pain Clinic  gabapentin did not control patient's pain,  Unable to go to high doses due to kidney concern  Primary osteoarthritis of right knee -     Ambulatory referral to Orthopedic Surgery  Gastroesophageal reflux disease, esophagitis presence not specified -      Refill omeprazole (PRILOSEC) 20 MG capsule; Take 1 capsule (20 mg total) by mouth daily. Take with Harvoni at the same time under FASTING condition Discussed diet and weight with patient relating to acid reflux.  Went over things that may exacerbate acid reflux such as tomatoes, spicy foods, coffee, carbonated beverages, chocolates, etc.  Advised patient to avoid laying down at least two hours after meals and sleep with HOB elevated. '   patient reports that he will likely need to go to emergency department for pain management of his right leg. He refused tramadol and Tylenol 3 for pain.   Return in about 3 months (around 06/05/2015) for DM/HTN---please schedule with [place].        Lance Bosch, [facility] [phone] 03/06/2015, 10:44 AM

## 2015-03-06 NOTE — Patient Instructions (Signed)
Your lasix and lisinopril should come from your Cardiologist. Please ask for refills when you go on Monday for your coumadin check

## 2015-03-06 NOTE — Progress Notes (Signed)
Patient here with care giver Patient here for medication refill Patient has fallen twice recently and has banged up both legs Patient also requesting referrals to several specialists

## 2015-03-09 ENCOUNTER — Telehealth: Payer: Self-pay

## 2015-03-09 ENCOUNTER — Ambulatory Visit (INDEPENDENT_AMBULATORY_CARE_PROVIDER_SITE_OTHER): Payer: Medicaid Other | Admitting: Physician Assistant

## 2015-03-09 ENCOUNTER — Other Ambulatory Visit: Payer: Self-pay

## 2015-03-09 ENCOUNTER — Encounter: Payer: Self-pay | Admitting: Physician Assistant

## 2015-03-09 ENCOUNTER — Ambulatory Visit (INDEPENDENT_AMBULATORY_CARE_PROVIDER_SITE_OTHER): Payer: Medicaid Other | Admitting: Pharmacist

## 2015-03-09 VITALS — Wt 291.0 lb

## 2015-03-09 DIAGNOSIS — I4891 Unspecified atrial fibrillation: Secondary | ICD-10-CM | POA: Diagnosis not present

## 2015-03-09 DIAGNOSIS — Z7901 Long term (current) use of anticoagulants: Secondary | ICD-10-CM

## 2015-03-09 DIAGNOSIS — I480 Paroxysmal atrial fibrillation: Secondary | ICD-10-CM | POA: Diagnosis not present

## 2015-03-09 DIAGNOSIS — I10 Essential (primary) hypertension: Secondary | ICD-10-CM

## 2015-03-09 DIAGNOSIS — I5033 Acute on chronic diastolic (congestive) heart failure: Secondary | ICD-10-CM

## 2015-03-09 LAB — BASIC METABOLIC PANEL
BUN: 36 mg/dL — AB (ref 6–23)
CHLORIDE: 105 meq/L (ref 96–112)
CO2: 23 meq/L (ref 19–32)
Calcium: 8.5 mg/dL (ref 8.4–10.5)
Creatinine, Ser: 2.48 mg/dL — ABNORMAL HIGH (ref 0.40–1.50)
GFR: 28.5 mL/min — ABNORMAL LOW (ref >60.00)
GLUCOSE: 161 mg/dL — AB (ref 70–99)
POTASSIUM: 3.8 meq/L (ref 3.5–5.1)
Sodium: 136 mEq/L (ref 135–145)

## 2015-03-09 LAB — POCT INR: INR: 2.6

## 2015-03-09 MED ORDER — RIVAROXABAN 20 MG PO TABS: 20.0000 mg | ORAL_TABLET | Freq: Every day | ORAL | Status: DC

## 2015-03-09 MED ORDER — FUROSEMIDE 80 MG PO TABS: 80.0000 mg | ORAL_TABLET | Freq: Two times a day (BID) | ORAL | Status: DC

## 2015-03-09 MED ORDER — METOPROLOL TARTRATE 50 MG PO TABS: 50.0000 mg | ORAL_TABLET | Freq: Two times a day (BID) | ORAL | Status: DC

## 2015-03-09 MED ORDER — LISINOPRIL 40 MG PO TABS: 40.0000 mg | ORAL_TABLET | Freq: Every day | ORAL | Status: DC

## 2015-03-09 NOTE — Telephone Encounter (Signed)
Returned patient phone call Patient is aware that we are not going to refill his valium

## 2015-03-09 NOTE — Progress Notes (Signed)
Cardiology Office Note   Date:  03/09/2015   ID:  [name], DOB 1956/01/09, MRN [id number]  PCP:  Lorayne Marek, MD  Cardiologist: Dr. Acie Fredrickson   Chief Complaint:    History of Present Illness: Johnny Sims is a 59 y.o. male who presents for follow-up of atrial fibrillation. He was last seen 01/28/15. He was supposed to have a cardioversion but didn't have right-sided canceled. He was rescheduled and underwent successful cardioversion 02/02/14. He was sent home on Coumadin and metoprolol.  The patient feels well and denies any chest pain or palpitations. He has increased edema and 13 pound weight gain. He is not taking his Lasix twice daily because he says it makes him urinate too much. Unfortunately he is back in atrial flutter. He has no idea when this occurred. He has multiple chronic complaints of pain and asking for pain medications. He is also considering switching to Eliquis.    Past Medical History  Diagnosis Date  . PAF (paroxysmal atrial fibrillation)   . Diabetes mellitus   . Hypothyroidism   . Hyperlipidemia   . Hypertension   . Chronic pain   . OA (osteoarthritis)   . COPD (chronic obstructive pulmonary disease)   . RA (rheumatoid arthritis)   . Hepatitis C   . Venous stasis   . Bipolar 1 disorder   . Hepatitis C   . Fibromyalgia   . Asthma   . Anxiety   . Melanoma     face, shoulder arm  . Pancreatitis, acute 2015  . GERD (gastroesophageal reflux disease)   . CKD (chronic kidney disease), stage II     Past Surgical History  Procedure Laterality Date  . Thyroidectomy    . Tonsillectomy    . US echocardiography  02/19/2010    EF 55-60%  . Skin cancer excision      melonoma  . Fracture surgery Left     arm  . Submandibular gland excision Left 12/25/2013    w/sialolithotomy  . Inguinal hernia repair Bilateral   . Hernia repair      umbicial  . Knee surgery      /H&P 02/20/2010  . Submandibular gland excision Left 12/25/2013    Procedure: SUBMANDIBULAR  GLAND STONE AND CHRONIC SIALOLITHIASIS EXCISION;  Surgeon: Melida Quitter, MD;  Location: [facility];  Service: ENT;  Laterality: Left;  . Amputation Left 11/11/2014    Procedure: FOOT, SECOND [place] AMPUTATION;  Surgeon: Newt Minion, MD;  Location: [facility];  Service: Orthopedics;  Laterality: Left;  . Cardioversion  02/03/2015  . Cardioversion N/A 02/03/2015    Procedure: CARDIOVERSION;  Surgeon: Thayer Headings, MD;  Location: [facility] ENDOSCOPY;  Service: Cardiovascular;  Laterality: N/A;     Current Outpatient Prescriptions  Medication Sig Dispense Refill  . albuterol (PROVENTIL HFA;VENTOLIN HFA) 108 (90 BASE) MCG/ACT inhaler Inhale 2 puffs into the lungs every 4 (four) hours as needed for wheezing or shortness of breath.    . calcitRIOL (ROCALTROL) 0.25 MCG capsule Take 1 capsule (0.25 mcg total) by mouth daily. 30 capsule 0  . COUMADIN 10 MG tablet Take 1 tablet (10 mg total) by mouth as directed. (Patient taking differently: Take 10 mg by mouth See admin instructions. Take 10 mg by mouth on Monday, Tuesday, Wednesday, Friday and Saturday.) 30 tablet 1  . COUMADIN 7.5 MG tablet Take 1 tablet (7.5 mg total) by mouth as directed. (Patient taking differently: Take 7.5 mg by mouth 2 (two) times a week. Take 7.5mg   by mouth on Sunday and Thursday at [facility]) 30 tablet 1  . diazepam (VALIUM) 10 MG tablet Take 10 mg by mouth 3 (three) times daily. For anxiety    . fluticasone (FLONASE) 50 MCG/ACT nasal spray Place 1 spray into both nostrils daily as needed for allergies.     . furosemide (LASIX) 80 MG tablet Take 2 tablets (160 mg total) by mouth 2 (two) times daily. (Patient taking differently: Take 80-160 mg by mouth daily. Take 80 mg by mouth in the morning and take 80mg  extra in the morning as needed) 40 tablet 0  . Insulin Glargine (LANTUS SOLOSTAR) 100 UNIT/ML Solostar Pen Inject 28 Units into the skin daily at 10 pm. 15 mL 11  . Ledipasvir-Sofosbuvir (HARVONI) 90-400 MG TABS Take 1 tablet by mouth daily. 28 tablet  2  . levothyroxine (SYNTHROID) 88 MCG tablet Take 1 tablet (88 mcg total) by mouth daily before breakfast. Will send additional refills based off blood level 30 tablet 0  . lisinopril (PRINIVIL,ZESTRIL) 40 MG tablet Take 40 mg by mouth daily.    . metoprolol (LOPRESSOR) 50 MG tablet Take 1 tablet (50 mg total) by mouth 2 (two) times daily. 180 tablet 5  . Multiple Vitamin (MULTIVITAMIN) tablet Take 1 tablet by mouth every morning.     . nicotine (NICODERM CQ - DOSED IN MG/24 HOURS) 14 mg/24hr patch Place 1 patch (14 mg total) onto the skin daily. 28 patch 0  . omeprazole (PRILOSEC) 20 MG capsule Take 1 capsule (20 mg total) by mouth daily. Take with Harvoni at the same time under FASTING condition 90 capsule 11  . oxycodone (ROXICODONE) 30 MG immediate release tablet Take 1 tablet (30 mg total) by mouth every 6 (six) hours as needed for pain. (Patient taking differently: Take 30-60 mg by mouth 5 (five) times daily as needed for pain. ) 20 tablet 0  . simvastatin (ZOCOR) 20 MG tablet Take 1 tablet (20 mg total) by mouth daily at 6 PM. 90 tablet 5  . tiotropium (SPIRIVA HANDIHALER) 18 MCG inhalation capsule Place 1 capsule (18 mcg total) into inhaler and inhale once. 30 capsule 0  . [DISCONTINUED] Insulin Glulisine (APIDRA) 100 UNIT/ML Solostar Pen Inject 28 Units into the skin at bedtime. 15 mL 11   No current facility-administered medications for this visit.    Allergies:   Novolog; Antihistamines, chlorpheniramine-type; Iodinated diagnostic agents; Lidocaine; Other; Pentazocine lactate; Sulfa antibiotics; and Vicodin    Social History:  The patient  reports that he has been smoking Cigarettes.  He has a 43 pack-year smoking history. He has never used smokeless tobacco. He reports that he drinks alcohol. He reports that he does not use illicit drugs.   Family History:  The patient's    family history includes Alzheimer's disease in his mother; Heart disease in his father; Hypertension in his  mother.    ROS:  Please see the history of present illness.   Otherwise, review of systems are positive for chronic pain, anxiety   All other systems are reviewed and negative.    PHYSICAL EXAM: VS:  BP 120/70 mmHg  Pulse 110  Ht 6\' 9"  (2.057 m)  Wt 297 lb (134.718 kg)  BMI 31.84 kg/m2  SpO2 97% , BMI Body mass index is 31.84 kg/(m^2). GEN: Well nourished, well developed, in no acute distress Neck: no JVD, HJR, carotid bruits, or masses Cardiac: Irreg irreg no murmurs,gallop, rubs, thrill or heave,  Respiratory:  Decreased breath sounds but clear to auscultation  bilaterally, normal work of breathing GI: soft, nontender, nondistended, + BS MS: no deformity or atrophy Extremities: +1-2 brawny edema on the right up to his knee +1 brawny edema on the left , decreased distal pulses  Skin: warm and dry, no rash Neuro:  Strength and sensation are intact    EKG:  EKG is ordered today. The ekg ordered today demonstrates atrial flutter at 99 bpm with LVH Recent Labs: 11/12/2014: ALT 17 01/30/2015: B Natriuretic Peptide 416.6*; BUN 27*; Creatinine, Ser 2.57*; Hemoglobin 11.6*; Platelets 188; Potassium 4.5; Sodium 138    Lipid Panel    Component Value Date/Time   CHOL  02/19/2010 0418    114        ATP III CLASSIFICATION:  <200     mg/dL   Desirable  200-239  mg/dL   Borderline High  >=240    mg/dL   High          TRIG 266* 02/19/2010 0418   HDL 28* 02/19/2010 0418   CHOLHDL 4.1 02/19/2010 0418   VLDL 53* 02/19/2010 0418   LDLCALC  02/19/2010 0418    33        Total Cholesterol/HDL:CHD Risk Coronary Heart Disease Risk Table                     Men   Women  1/2 Average Risk   3.4   3.3  Average Risk       5.0   4.4  2 X Average Risk   9.6   7.1  3 X Average Risk  23.4   11.0        Use the calculated Patient Ratio above and the CHD Risk Table to determine the patient's CHD Risk.        ATP III CLASSIFICATION (LDL):  <100     mg/dL   Optimal  100-129  mg/dL   Near or  Above                    Optimal  130-159  mg/dL   Borderline  160-189  mg/dL   High  >190     mg/dL   Very High      Wt Readings from Last 3 Encounters:  03/09/15 297 lb (134.718 kg)  03/09/15 291 lb (131.997 kg)  03/06/15 293 lb 12.8 oz (133.267 kg)      Other studies Reviewed: Additional studies/ records that were reviewed today include and review of the records demonstrates:  Echo Study Conclusions  - Procedure narrative: Transthoracic echocardiography. Image   quality was adequate. The study was technically difficult.   Intravenous contrast (Definity) was administered. - Left ventricle: The cavity size was normal. Wall thickness was   increased in a pattern of mild LVH. Systolic function was normal.   The estimated ejection fraction was in the range of 55% to 60%.   Wall motion was normal; there were no regional wall motion   abnormalities. The study is not technically sufficient to allow   evaluation of LV diastolic function. - Aorta: Aortic root dimension: 40 mm (ED). - Aortic root: The aortic root was top normal in size. - Mitral valve: Calcified annulus. - Left atrium: The atrium was at the upper limits of normal in   size.  Impressions:  - Compared to a prior echo in 2011, there have been few changes.   The aortic root measures top normal at 4.0 cm.   ASSESSMENT AND PLAN:  Atrial  fibrillation with RVR Patient underwent cardioversion on 02/03/15 to normal sinus rhythm. Unfortunately he is back in atrial flutter rate was 110 when he came in but now is 99. He is on Coumadin. I will increase his metoprolol to 100 mg the morning and 50 in the evening for better rate control. Continue Coumadin for now. He is thinking about switching to Eliquis. Will discuss with Dr.Nahser to see if he wants to try another medication to help him maintain sinus rhythm  Essential hypertension Continue lisinopril  Acute on chronic diastolic CHF (congestive heart failure), NYHA class  3 Patient has 13 pound weight gain and increased edema since hospitalization. He is not taking his Lasix twice a day. Recommend increase Lasix to 80 mg twice a day.  Long term current use of anticoagulant therapy Patient is considering switching to Eliquis. Continue coumadin for now.    Sumner Boast, PA-C  03/09/2015 10:23 AM    [facility] [address], [place], [place]  [address] Phone: [phone]; Fax: [phone]

## 2015-03-09 NOTE — Assessment & Plan Note (Signed)
Patient is considering switching to Eliquis. Continue coumadin for now.

## 2015-03-09 NOTE — Telephone Encounter (Signed)
I will not fill this medication for patient

## 2015-03-09 NOTE — Assessment & Plan Note (Signed)
Patient has 13 pound weight gain and increased edema since hospitalization. He is not taking his Lasix twice a day. Recommend increase Lasix to 80 mg twice a day.

## 2015-03-09 NOTE — Progress Notes (Signed)
Patient is currently taking warfarin for atrial fibrillation and is interested in switching to either Xarelto or Eliquis from warfarin - this was discussed with Dr. Acie Fredrickson during patient's last visit on 01/28/15. Patient was cardioverted on 8/9 and has remained therapeutic on warfarin since cardioversion. INR therapeutic today at 2.6. Rechecked BMET today since SCr had recently been trending up - SCr 2.48, CrCl ~24mL/min, weight 291 lbs. Reviewed medications - patient is on no strong CYP inducers or inhibitors.   Patient reports that he was previously told he should not be on Xarelto due to his kidney function. Would actually prefer Xarelto over Eliquis since Eliquis was studied in patients with SCr < 2.4 or CrCl > 68mL/min in [facility] trial and patient would not have fit this inclusion criteria. Xarelto approved in patients with CrCl down to 74mL/min. Patient qualifies for Xarelto 20mg  daily given CrCl > 45mL/min. Recent Hgb/Hct drawn 1 month ago and low but stable at 11.6 and 34.1. New Rx sent to patient's pharmacy - instructed him to start Xarelto tonight with supper and stop taking warfarin since INR < 3 today.  Scheduled 1 month Xarelto f/u visit with patient.

## 2015-03-09 NOTE — Assessment & Plan Note (Signed)
Continue lisinopril

## 2015-03-09 NOTE — Assessment & Plan Note (Signed)
Patient underwent cardioversion on 02/03/15 to normal sinus rhythm. Unfortunately he is back in atrial flutter rate was 110 when he came in but now is 99. He is on Coumadin. I will increase his metoprolol to 100 mg the morning and 50 in the evening for better rate control. Continue Coumadin for now. He is thinking about switching to Eliquis. Will discuss with Dr.Nahser to see if he wants to try another medication to help him maintain sinus rhythm

## 2015-03-09 NOTE — Telephone Encounter (Signed)
Burnetta from [facility] stopped in office Was inquiring for patient on antibiotic for his cellulitis Spoke with Chari Manning NP and we were not treating him for cellulitis To his leg. We are not prescribing an antibiotic at this time Left message on her voice mail to return my call

## 2015-03-09 NOTE — Patient Instructions (Signed)
Medication Instructions:  Your physician has recommended you make the following change in your medication:  1  Increase your Metoprolol to 50 mg taking 2 tablets in the morning and 1 tablet in the evening. 2. Lasix is now 80 mgs taking 1 tablet twice a day   Labwork: None ordered  Testing/Procedures: None ordered  Follow-Up: Your physician recommends that you schedule a follow-up appointment in: with Dr. Acie Fredrickson in 1-2 weeks.

## 2015-03-09 NOTE — Telephone Encounter (Signed)
Patient called again requesting a refill on his valium i explained to him that we are not going to refill this Patient wanted me to ask the provider again Patient also saw his specialist-they are taking him off coumadin and patient  Stated he will start xarelto 20 mg tomorrow

## 2015-03-23 ENCOUNTER — Other Ambulatory Visit: Payer: Medicaid Other

## 2015-03-23 ENCOUNTER — Ambulatory Visit: Payer: Medicaid Other | Admitting: Pharmacist Clinician (PhC)/ Clinical Pharmacy Specialist

## 2015-03-23 DIAGNOSIS — B182 Chronic viral hepatitis C: Secondary | ICD-10-CM

## 2015-03-23 NOTE — Progress Notes (Addendum)
Patient ID: Johnny Sims, male   DOB: [birth year], 59 y.o.   MRN: [id number] HPI: Johnny Sims is a 59 y.o. male who is here for his f/u of his hep C treatment.   Lab Results  Component Value Date   HCVGENOTYPE 1a 09/23/2014    Allergies: Allergies  Allergen Reactions  . Novolog [Insulin Aspart] Other (See Comments)    "I'm just highly allergic"  . Antihistamines, Chlorpheniramine-Type Other (See Comments)    unknown  . Iodinated Diagnostic Agents Other (See Comments)    Patient states he doesn't know what iodine is and doesn't think he is allergic to it despite it being listed with his allergies  . Lidocaine Itching and Other (See Comments)    Patient is uncertain of this allergy  . Other Hives and Other (See Comments)    Steroid creams  . Pentazocine Lactate Other (See Comments)    unknown  . Sulfa Antibiotics Itching and Other (See Comments)    whelps  . Vicodin [Hydrocodone-Acetaminophen] Palpitations and Other (See Comments)    Doesn't want to take    Vitals:    Past Medical History: Past Medical History  Diagnosis Date  . PAF (paroxysmal atrial fibrillation)   . Diabetes mellitus   . Hypothyroidism   . Hyperlipidemia   . Hypertension   . Chronic pain   . OA (osteoarthritis)   . COPD (chronic obstructive pulmonary disease)   . RA (rheumatoid arthritis)   . Hepatitis C   . Venous stasis   . Bipolar 1 disorder   . Hepatitis C   . Fibromyalgia   . Asthma   . Anxiety   . Melanoma     face, shoulder arm  . Pancreatitis, acute 2015  . GERD (gastroesophageal reflux disease)   . CKD (chronic kidney disease), stage II     Social History: Social History   Social History  . Marital Status: Single    Spouse Name: N/A  . Number of Children: N/A  . Years of Education: N/A   Social History Main Topics  . Smoking status: Current Every Day Smoker -- 1.00 packs/day for 43 years    Types: Cigarettes  . Smokeless tobacco: Never Used  . Alcohol Use: Yes      Comment: rare  . Drug Use: No  . Sexual Activity: Not on file   Other Topics Concern  . Not on file   Social History Narrative    Labs: HEP B S AB (no units)  Date Value  11/12/2014 POSITIVE*   HEPATITIS B SURFACE AG (no units)  Date Value  11/12/2014 NEGATIVE   HCV AB (no units)  Date Value  11/12/2014 Reactive*    Lab Results  Component Value Date   HCVGENOTYPE 1a 09/23/2014    Hepatitis C RNA quantitative Latest Ref Rng 11/12/2014 09/23/2014  HCV Quantitative <15 IU/mL 521761(H) 1572620(B)  HCV Quantitative Log <1.18 log 10 5.72(H) 6.21(H)    AST (U/L)  Date Value  11/12/2014 22  11/11/2014 18  10/15/2014 26   ALT (U/L)  Date Value  11/12/2014 17  11/11/2014 15*  10/15/2014 19   INR (no units)  Date Value  03/09/2015 2.6  02/10/2015 2.3  01/30/2015 2.62*  01/28/2015 3.0  12/28/2014 2.62*  11/17/2014 2.46*    CrCl: CrCl cannot be calculated (Unknown ideal weight.).  Fibrosis Score: F3/4 as assessed by [facility].   Child-Pugh Score: Class A?  Previous Treatment Regimen: Naive  Assessment: Bernd has multiple issues including diabetic  nephropathy, chronic pain, hep c, etc. He was originally planned for [place] but his childpugh score calculation is affected by his INR due to the coumadin. His scr has always been around 2.5 and his crcl is still >30 due to size. He was approved for harvoni and has not missed any doses. He started on 9/2 so it's ok to do a VL at this point since he has medicaid. We'll need that for the extension. He is on prilosec 20mg . I tried to convince to stay off of it if possible. He a little odd so I can't gauge if he'll do it. He knows to fast and take it at the same time with harvoni. One other interesting thing is that he is also on Xarelto for his afib. Due to his CKD, it could be an issue in the future. Cards is aware of the xarelto issue and will cont and monitor.   Recommendations:  Cont Harvoni 1 PO qday VL  today   Onnie Boer [place], [place].D., BCPS, AAHIVP Clinical Infectious [facility] 03/23/2015, 10:20 AM

## 2015-03-24 ENCOUNTER — Encounter: Payer: Self-pay | Admitting: Pharmacy Technician

## 2015-03-25 ENCOUNTER — Ambulatory Visit: Payer: Medicaid Other

## 2015-03-25 LAB — HEPATITIS C RNA QUANTITATIVE: HCV QUANT: NOT DETECTED [IU]/mL (ref <15)

## 2015-03-26 ENCOUNTER — Ambulatory Visit (INDEPENDENT_AMBULATORY_CARE_PROVIDER_SITE_OTHER): Payer: Medicaid Other | Admitting: Cardiovascular Disease

## 2015-03-26 ENCOUNTER — Encounter: Payer: Self-pay | Admitting: Cardiovascular Disease

## 2015-03-26 VITALS — BP 120/68 | HR 102 | Ht 79.0 in | Wt 286.1 lb

## 2015-03-26 DIAGNOSIS — I480 Paroxysmal atrial fibrillation: Secondary | ICD-10-CM | POA: Diagnosis not present

## 2015-03-26 MED ORDER — LISINOPRIL 20 MG PO TABS: 20.0000 mg | ORAL_TABLET | Freq: Every day | ORAL | Status: DC

## 2015-03-26 MED ORDER — METOPROLOL TARTRATE 50 MG PO TABS: 50.0000 mg | ORAL_TABLET | Freq: Three times a day (TID) | ORAL | Status: DC

## 2015-03-26 NOTE — Patient Instructions (Addendum)
Medication Instructions:  DECREASE Xarelto to 15 mg once daily  INCREASE Metoprolol to 50 mg 3 times per day  DECREASE Lisinopril to 20 mg once daily    Labwork: None Ordered   Testing/Procedures: None Ordered   Follow-Up: You have been referred to [facility] - please schedule an appointment at check out  You have been referred to Cardiac Rehab - you will receive a call to schedule  Your physician recommends you return for Nurse visit/EKG/BP check 1 month - you are scheduled for Tuesday 11/1 @ 11:00  Your physician recommends that you schedule a follow-up appointment in: 3 months with Dr. Acie Fredrickson

## 2015-03-26 NOTE — Progress Notes (Signed)
Johnny Sims Date of Birth  [birth year]       [facility] [address], [address], [place], [place]  [address]   [place], [place]  [address] [phone]     [phone]   Fax  [phone]    Fax [phone]  Problem List: 1. Paroxysmal atrial fibrillation 2. Hypothyroidism 3. Diabetes mellitus 4. Hyperlipidemia 5. Hypertension 6. Chronic pain  History of Present Illness:  Johnny Sims is a 59 yo who I have seen in the past for PAF.  His primary medical office is the [facility] in [place].  He is on his third doctor in the past several years. He is not very  happy about this.  He's having lots of abdominal pain. He's had a poor appetite. He complains of being very thirsty and his reflux water. Also complains of polyuria. He states that his glucose levels have been well controlled.  He has having lots of generalized fatigue. She also is having trouble swallowing and chokes when he eats his cereal in the morning.  He denies any chest pain or shortness breath. He was seen in the emergency room in November, 2013 gallbladder pain. He was noted to be in normal sinus rhythm by EKG at that time.  September 26, 2012:  Johnny Sims has been doing well.  He would like to try Xarelto instead of coumadin.  He is doing well.  No CP.  He is having some problems with his Hep C.    Sept. 11, 2014:  Johnny Sims is seen back for eval of his PAF.  Has had a cough.  Still smoking.    October 14, 2013:  No cardiac complaints today.  Having left face pain due to a salivary gland stone.  Also has had some abdominal pain.   Was found to have ? Gall bladder and possibly pancreatitis problems.    He is walking some - several times a week.  Still smoking.    He thinks that he is taking both generic warfarin and brand name coumadin.   January 28, 2015:  Johnny Sims has developed atrial fib. Was set up for cardioversion but did not have a ride home so he cancelled.    Having some shortness of breath.   03/26/2015:  Johnny Sims is seen back for follow-up visit. He had a cardioversion earlier this year but he went back into atrial fibrillation/flutter. Has changed from coumadin to Xarelto. He ne  Current Outpatient Prescriptions on File Prior to Visit  Medication Sig Dispense Refill  . albuterol (PROVENTIL HFA;VENTOLIN HFA) 108 (90 BASE) MCG/ACT inhaler Inhale 2 puffs into the lungs every 4 (four) hours as needed for wheezing or shortness of breath.    . calcitRIOL (ROCALTROL) 0.25 MCG capsule Take 1 capsule (0.25 mcg total) by mouth daily. 30 capsule 0  . diazepam (VALIUM) 10 MG tablet Take 10 mg by mouth 3 (three) times daily. For anxiety    . fluticasone (FLONASE) 50 MCG/ACT nasal spray Place 1 spray into both nostrils daily as needed for allergies.     . furosemide (LASIX) 80 MG tablet Take 1 tablet (80 mg total) by mouth 2 (two) times daily. 60 tablet 0  . Insulin Glargine (LANTUS SOLOSTAR) 100 UNIT/ML Solostar Pen Inject 28 Units into the skin daily at 10 pm. 15 mL 11  . Ledipasvir-Sofosbuvir (HARVONI) 90-400 MG TABS Take 1 tablet by mouth daily. 28 tablet 2  .  levothyroxine (SYNTHROID) 88 MCG tablet Take 1 tablet (88 mcg total) by mouth daily before breakfast. Will send additional refills based off blood level 30 tablet 0  . lisinopril (PRINIVIL,ZESTRIL) 40 MG tablet Take 1 tablet (40 mg total) by mouth daily. 30 tablet 11  . metoprolol (LOPRESSOR) 50 MG tablet Take 1 tablet (50 mg total) by mouth 2 (two) times daily. Take 2 tablets by mouth in the a.m. and take 1 tablet by mouth in the evening. 90 tablet 5  . Multiple Vitamin (MULTIVITAMIN) tablet Take 1 tablet by mouth every morning.     . nicotine (NICODERM CQ - DOSED IN MG/24 HOURS) 14 mg/24hr patch Place 1 patch (14 mg total) onto the skin daily. 28 patch 0  . omeprazole (PRILOSEC) 20 MG capsule Take 1 capsule (20 mg total) by mouth daily. Take with Harvoni at the same time under FASTING condition 90  capsule 11  . rivaroxaban (XARELTO) 20 MG TABS tablet Take 1 tablet (20 mg total) by mouth daily with supper. 30 tablet 11  . simvastatin (ZOCOR) 20 MG tablet Take 1 tablet (20 mg total) by mouth daily at 6 PM. 90 tablet 5  . tiotropium (SPIRIVA HANDIHALER) 18 MCG inhalation capsule Place 1 capsule (18 mcg total) into inhaler and inhale once. 30 capsule 0  . [DISCONTINUED] Insulin Glulisine (APIDRA) 100 UNIT/ML Solostar Pen Inject 28 Units into the skin at bedtime. 15 mL 11   No current facility-administered medications on file prior to visit.    Allergies  Allergen Reactions  . Novolog [Insulin Aspart] Other (See Comments)    "I'm just highly allergic"  . Antihistamines, Chlorpheniramine-Type Other (See Comments)    unknown  . Iodinated Diagnostic Agents Other (See Comments)    Patient states he doesn't know what iodine is and doesn't think he is allergic to it despite it being listed with his allergies  . Lidocaine Itching and Other (See Comments)    Patient is uncertain of this allergy  . Other Hives and Other (See Comments)    Steroid creams  . Pentazocine Lactate Other (See Comments)    unknown  . Sulfa Antibiotics Itching and Other (See Comments)    whelps  . Vicodin [Hydrocodone-Acetaminophen] Palpitations and Other (See Comments)    Doesn't want to take    Past Medical History  Diagnosis Date  . PAF (paroxysmal atrial fibrillation)   . Diabetes mellitus   . Hypothyroidism   . Hyperlipidemia   . Hypertension   . Chronic pain   . OA (osteoarthritis)   . COPD (chronic obstructive pulmonary disease)   . RA (rheumatoid arthritis)   . Hepatitis C   . Venous stasis   . Bipolar 1 disorder   . Hepatitis C   . Fibromyalgia   . Asthma   . Anxiety   . Melanoma     face, shoulder arm  . Pancreatitis, acute 2015  . GERD (gastroesophageal reflux disease)   . CKD (chronic kidney disease), stage II     Past Surgical History  Procedure Laterality Date  . Thyroidectomy      . Tonsillectomy    . US echocardiography  02/19/2010    EF 55-60%  . Skin cancer excision      melonoma  . Fracture surgery Left     arm  . Submandibular gland excision Left 12/25/2013    w/sialolithotomy  . Inguinal hernia repair Bilateral   . Hernia repair      umbicial  . Knee surgery      /  H&P 02/20/2010  . Submandibular gland excision Left 12/25/2013    Procedure: SUBMANDIBULAR GLAND STONE AND CHRONIC SIALOLITHIASIS EXCISION;  Surgeon: Melida Quitter, MD;  Location: [facility];  Service: ENT;  Laterality: Left;  . Amputation Left 11/11/2014    Procedure: FOOT, SECOND Watson AMPUTATION;  Surgeon: Newt Minion, MD;  Location: [facility];  Service: Orthopedics;  Laterality: Left;  . Cardioversion  02/03/2015  . Cardioversion N/A 02/03/2015    Procedure: CARDIOVERSION;  Surgeon: Thayer Headings, MD;  Location: [facility] ENDOSCOPY;  Service: Cardiovascular;  Laterality: N/A;    History  Smoking status  . Current Every Day Smoker -- 1.00 packs/day for 43 years  . Types: Cigarettes  Smokeless tobacco  . Never Used    History  Alcohol Use  . Yes    Comment: rare    Family History  Problem Relation Age of Onset  . Hypertension Mother   . Alzheimer's disease Mother   . Heart disease Father     Reviw of Systems:  Reviewed in the HPI.  All other systems are negative.  Physical Exam: Blood pressure 120/68, pulse 102, height 6\' 7"  (2.007 m), weight 129.783 kg (286 lb 1.9 oz). General: Well developed, well nourished, in no acute distress.  Head: Normocephalic, atraumatic, sclera non-icteric, mucus membranes are moist,  Poor dentition  Neck: Supple. Carotids are 2 + without bruits. No JVD   Lungs: bilateral wheezing.   R> L   Heart: Irreg. Irreg.  soft systolic murmur.  Abdomen: Soft, non-tender, non-distended with normal bowel sounds.  Msk:  Strength and tone are normal   Extremities: No clubbing or cyanosis. 1-2 +   edema.  Chronic stasis changes.   Distal pedal pulses are 2+ and equal    Neuro: CN II - XII intact.  Alert and oriented X 3.   Psych:  He has a history of bipolar disease.  ECG:  Assessment / Plan:   1. atrial fibrillation - he's had persistent atrial fibrillation.    We cardioverted him but unfortunately, he went back into atrial fibrillation. He has significant renal dysfunction. It's difficult to know which antiarrhythmics up he he may respond to.  I would like to increase his metoprolol to 50 mg 3 times a day for better rate control. We will refer him to the A. Fib clinic.  He's been on Xarelto 20 mg a day. He has significant renal dysfunction so we will decrease the Xarelto to 15 mg a day.  We'll schedule for next Tuesday.   2. Hypothyroidism - continue meds.  He needs to follow up with his medical doctor  3. Diabetes mellitus  4. Hyperlipidemia  5. Hypertension - BP is well controlled.   6. Chronic pain  7.  CKD :  Per int. Med / nephrology .   Nahser, Wonda Cheng, MD  03/26/2015 3:02 PM    [facility] [place],  [place], [place]  [address] Pager [phone] Phone: [phone]; Fax: [phone]   [facility]  [address] [place], [place]  [address] [phone]   Fax [phone]

## 2015-04-01 ENCOUNTER — Other Ambulatory Visit: Payer: Self-pay | Admitting: Cardiovascular Disease

## 2015-04-01 NOTE — Telephone Encounter (Signed)
Should this be deferred to pcp? 

## 2015-04-02 ENCOUNTER — Inpatient Hospital Stay (HOSPITAL_COMMUNITY)
Admission: RE | Admit: 2015-04-02 | Discharge: 2015-04-02 | Disposition: A | Discharge status: Home / Self Care | Payer: Medicaid Other | Source: Ambulatory Visit | Attending: Nurse Practitioner | Admitting: Nurse Practitioner

## 2015-04-02 ENCOUNTER — Other Ambulatory Visit: Payer: Self-pay

## 2015-04-02 MED ORDER — RIVAROXABAN 15 MG PO TABS: 15.0000 mg | ORAL_TABLET | Freq: Every day | ORAL | Status: DC

## 2015-04-02 NOTE — Telephone Encounter (Signed)
Thayer Headings, MD at 03/26/2015 3:01 PM  Patient Instructions     Medication Instructions:  DECREASE Xarelto to 15 mg once daily  INCREASE Metoprolol to 50 mg 3 times per day  DECREASE Lisinopril to 20 mg once daily

## 2015-04-03 ENCOUNTER — Telehealth: Payer: Self-pay | Admitting: Internal Medicine

## 2015-04-03 NOTE — Telephone Encounter (Signed)
Johnny Sims called to notify us that Johnny Sims has attempted to call patient since Tuesday 03/31/15, again on Wednesday and today to re-schedule his appt and patient was unable to be reached.

## 2015-04-07 ENCOUNTER — Telehealth: Payer: Self-pay | Admitting: Cardiovascular Disease

## 2015-04-07 MED ORDER — RIVAROXABAN 15 MG PO TABS: 15.0000 mg | ORAL_TABLET | Freq: Every day | ORAL | Status: DC

## 2015-04-07 NOTE — Telephone Encounter (Signed)
New Message   Pt wants to be informed about Dr.Nahser instructions when the patient was in the office  Last on 03-26-15  Please call him back

## 2015-04-07 NOTE — Telephone Encounter (Signed)
Spoke with patient who states Dr. Acie Fredrickson decreased his dose of Xarelto to 15 mg at last office visit but his pharmacy doesn't have the Rx.  He asked me to call [facility] to clarify that he is no longer taking Xarelto 20 mg and make sure they have Rx for 15 mg tablets.  He asks about future CVRR appointments since he is no longer taking warfarin.  I advised him that he will need 1 more appointment with CVRR to check his lab work since he is taking Xarelto.  He verbalized understanding and agreement.  I spoke with at [facility] and he verified receipt of Rx for Xarelto 15 mg.

## 2015-04-08 ENCOUNTER — Ambulatory Visit (INDEPENDENT_AMBULATORY_CARE_PROVIDER_SITE_OTHER): Payer: Medicaid Other | Admitting: *Deleted

## 2015-04-08 DIAGNOSIS — I480 Paroxysmal atrial fibrillation: Secondary | ICD-10-CM | POA: Diagnosis not present

## 2015-04-08 LAB — BASIC METABOLIC PANEL
BUN: 49 mg/dL — AB (ref 7–25)
CHLORIDE: 107 mmol/L (ref 98–110)
CO2: 23 mmol/L (ref 20–31)
Calcium: 8.3 mg/dL — ABNORMAL LOW (ref 8.6–10.3)
Creat: 3 mg/dL — ABNORMAL HIGH (ref 0.70–1.33)
GLUCOSE: 104 mg/dL — AB (ref 65–99)
POTASSIUM: 4 mmol/L (ref 3.5–5.3)
Sodium: 138 mmol/L (ref 135–146)

## 2015-04-08 LAB — CBC
HEMATOCRIT: 32.6 % — AB (ref 39.0–52.0)
HEMOGLOBIN: 11 g/dL — AB (ref 13.0–17.0)
MCH: 28.7 pg (ref 26.0–34.0)
MCHC: 33.7 g/dL (ref 30.0–36.0)
MCV: 85.1 fL (ref 78.0–100.0)
MPV: 9.5 fL (ref 8.6–12.4)
Platelets: 211 10*3/uL (ref 150–400)
RBC: 3.83 MIL/uL — ABNORMAL LOW (ref 4.22–5.81)
RDW: 15.4 % (ref 11.5–15.5)
WBC: 9.8 10*3/uL (ref 4.0–10.5)

## 2015-04-08 NOTE — Progress Notes (Signed)
Pt was started on Xarelto 20 mg daily  for Atrial Fib  on 03/10/2015. On 03/27/2015, Xarelto was reduced to Xarelto 15mg  daily by Dr. Acie Fredrickson but did not start as he states did not get change in Xarelto dose until today 04/08/2015.    Reviewed patients medication list.  Pt is not  currently on any combined P-gp and strong CYP3A4 inhibitors/inducers (ketoconazole, traconazole, ritonavir, carbamazepine, phenytoin, rifampin, St. John's wort).  Reviewed labs.  SCr increased to 3, Weight 134.7kg, CrCl 69mL/min, reduced from 60 when Xarelto first initiated.  Patient should be taking 15mg  daily given reduction in CrCl to <50 since initiated on therapy. Hgb and Hct 11/32.6, both stable. Patient is aware of dose reduction.  A full discussion of the nature of anticoagulants has been carried out.  A benefit/risk analysis has been presented to the patient, so that they understand the justification for choosing anticoagulation with Xarelto at this time.  The need for compliance is stressed.  Pt is aware to take the medication once daily with the largest meal of the day.  Side effects of potential bleeding are discussed, including unusual colored urine or stools, coughing up blood or coffee ground emesis, nose bleeds or serious fall or head trauma.  Discussed signs and symptoms of stroke. The patient should avoid any OTC items containing aspirin or ibuprofen.  Avoid alcohol consumption.   Call if any signs of abnormal bleeding.  Discussed financial obligations and states is not having  any difficulty in obtaining medication.  Next lab test test in 6 months. Pt states is not having any problems with bleeding nor sign or symptoms of stroke.

## 2015-04-09 ENCOUNTER — Inpatient Hospital Stay (HOSPITAL_COMMUNITY): Admission: RE | Admit: 2015-04-09 | Payer: Medicaid Other | Source: Ambulatory Visit | Admitting: Nurse Practitioner

## 2015-04-13 ENCOUNTER — Telehealth: Payer: Self-pay | Admitting: Nurse Practitioner

## 2015-04-13 MED ORDER — FUROSEMIDE 80 MG PO TABS: 80.0000 mg | ORAL_TABLET | Freq: Every day | ORAL | Status: DC

## 2015-04-13 NOTE — Telephone Encounter (Signed)
-----   Message from Thayer Headings, MD sent at 04/12/2015  7:16 PM EDT ----- His BUN and Creatinine are elevated. He has significant CKD but it appears that he is volume depleted also. Decrease Lasix to 80 just once a day instead of 80 BID . He needs to be seen by [facility] to be established.

## 2015-04-13 NOTE — Telephone Encounter (Signed)
Spoke with patient and reviewed results and plan of care with him.  He verbalized understanding and agreement to decrease Lasix to 80 mg to once daily.  States has an appointment with [facility] November 14.  Patient states he is establishing primary care with [facility] and has an appointment with them next week.  I advised him to call back with questions or concerns.  He verbalized understanding and agreement.

## 2015-04-14 ENCOUNTER — Telehealth (HOSPITAL_COMMUNITY): Payer: Self-pay | Admitting: *Deleted

## 2015-04-24 ENCOUNTER — Telehealth: Payer: Self-pay | Admitting: Cardiovascular Disease

## 2015-04-24 NOTE — Telephone Encounter (Signed)
NeW Message  Pt requested to speak w. RN concerning his new MD: Dr Creig Hines @ [facility]. Please call back and discuss.

## 2015-04-24 NOTE — Telephone Encounter (Signed)
Spoke with patient who states he called to clarify reason for appointments on Tuesday 11/1.  I reviewed reasons for bp/ekg and CVRR visits on Tuesday.  He verbalized understanding and agreement.

## 2015-04-28 ENCOUNTER — Ambulatory Visit (INDEPENDENT_AMBULATORY_CARE_PROVIDER_SITE_OTHER): Payer: Medicaid Other | Admitting: *Deleted

## 2015-04-28 ENCOUNTER — Ambulatory Visit: Payer: Medicaid Other | Admitting: *Deleted

## 2015-04-28 ENCOUNTER — Telehealth (HOSPITAL_COMMUNITY): Payer: Self-pay | Admitting: *Deleted

## 2015-04-28 VITALS — BP 152/82 | HR 76 | Ht >= 80 in | Wt 302.0 lb

## 2015-04-28 DIAGNOSIS — I4891 Unspecified atrial fibrillation: Secondary | ICD-10-CM

## 2015-04-28 DIAGNOSIS — I10 Essential (primary) hypertension: Secondary | ICD-10-CM | POA: Diagnosis not present

## 2015-04-28 LAB — BASIC METABOLIC PANEL
BUN: 38 mg/dL — ABNORMAL HIGH (ref 7–25)
CO2: 22 mmol/L (ref 20–31)
Calcium: 8.4 mg/dL — ABNORMAL LOW (ref 8.6–10.3)
Chloride: 106 mmol/L (ref 98–110)
Creat: 2.64 mg/dL — ABNORMAL HIGH (ref 0.70–1.33)
Glucose, Bld: 98 mg/dL (ref 65–99)
POTASSIUM: 4.2 mmol/L (ref 3.5–5.3)
Sodium: 136 mmol/L (ref 135–146)

## 2015-04-28 LAB — CBC
HEMATOCRIT: 33.5 % — AB (ref 39.0–52.0)
HEMOGLOBIN: 11.1 g/dL — AB (ref 13.0–17.0)
MCH: 28.3 pg (ref 26.0–34.0)
MCHC: 33.1 g/dL (ref 30.0–36.0)
MCV: 85.5 fL (ref 78.0–100.0)
MPV: 9.3 fL (ref 8.6–12.4)
Platelets: 210 10*3/uL (ref 150–400)
RBC: 3.92 MIL/uL — AB (ref 4.22–5.81)
RDW: 15.4 % (ref 11.5–15.5)
WBC: 7.8 10*3/uL (ref 4.0–10.5)

## 2015-04-28 NOTE — Progress Notes (Signed)
Pt was started on Xarelto 20mg  daily for Afib on March 10, 2015 by Dr. Acie Fredrickson and Xarelto dose was reduced to 15mg  daily on March 27, 2015.    Reviewed patients medication list.  Pt is not currently on any combined P-gp and strong CYP3A4 inhibitors/inducers (ketoconazole, traconazole, ritonavir, carbamazepine, phenytoin, rifampin, St. John's wort).  Reviewed labs.  SCr-2.64, Weight-137kg, Hgb-11.1, HCT-33.5, CrCl-58.38.  Dose appropriate based on creatinine clearance.   A full discussion of the nature of anticoagulants has been carried out.  A benefit/risk analysis has been presented to the patient, so that they understand the justification for choosing anticoagulation with Xarelto at this time.  The need for compliance is stressed.  Pt is aware to take the medication once daily with the largest meal of the day.  Side effects of potential bleeding are discussed, including unusual colored urine or stools, coughing up blood or coffee ground emesis, nose bleeds or serious fall or head trauma.  Discussed signs and symptoms of stroke. The patient should avoid any OTC items containing aspirin or ibuprofen.  Avoid alcohol consumption.   Call if any signs of abnormal bleeding.  Discussed financial obligations and resolved any difficulty in obtaining medication.  Next lab test test in 6 months.   While in the office the patient denies any bleeding problems and financial issues. Spoke with Gay Filler, Pharmacist, and she stated to find out from the patient how much Xarelto he has left and switch to Eliquis.  Left a message on the patient's voicemail to callback to discuss lab results.

## 2015-04-28 NOTE — Addendum Note (Signed)
Addended by: Thompson Grayer on: 04/28/2015 02:35 PM   Modules accepted: Orders

## 2015-04-28 NOTE — Progress Notes (Signed)
Pt here for BP check and EKG.  Pt reports he is having a lot of arthritis pain.  EKG and vital signs reviewed with Dr. Acie Fredrickson.  Pt to continue same treatment plan.

## 2015-04-30 MED ORDER — APIXABAN 5 MG PO TABS: 5.0000 mg | ORAL_TABLET | Freq: Two times a day (BID) | ORAL | Status: DC

## 2015-04-30 NOTE — Progress Notes (Signed)
Spoke with patient about switching from Xarelto to Eliquis due to fluctuating renal function. Explained to him that with his kidney function changing so frequently, the appropriate dose of Xarelto has changed from 20mg  to 15mg  and back to 20mg  with his lab work based on CrCl trending above or below 60mL/min. Discussed with patient that we will switch him to Eliquis 5mg  twice daily so that we will not have to worry about dose reducing. Patient reports that he has about 2 weeks of Xarelto left. He would like to call when he is running out so that we can help with the transition over to Eliquis. Informed patient that I will send his new Eliquis prescription to the pharmacy today for him to start after he is finished with his Xarelto. Patient understands plan of action.

## 2015-05-01 ENCOUNTER — Encounter (HOSPITAL_COMMUNITY): Payer: Self-pay

## 2015-05-04 ENCOUNTER — Telehealth: Payer: Self-pay | Admitting: Cardiovascular Disease

## 2015-05-04 NOTE — Telephone Encounter (Signed)
New message     Talk to Dr Liana Crocker because someone want to switch him from xarelto to eliquis.  He does not know why he is being switched.  Please call

## 2015-05-04 NOTE — Telephone Encounter (Signed)
Spoke with pt.  Explained reasoning of switching from Xarelto to Eliquis due to renal function.  He has been instructed to start Eliquis when his next dose of Xarelto would be due.

## 2015-05-13 ENCOUNTER — Telehealth: Payer: Self-pay | Admitting: Internal Medicine

## 2015-05-13 NOTE — Telephone Encounter (Signed)
Patient cancelled on 04/10/15 Patient no showed 05/11/15 Called today to rescheduled and patient stated that he cut his foot and couldn't talk right now and will call her back. Sharyn Lull will make a 3rd and final attempt and if patient does not answer he would need to be re-referred.

## 2015-05-28 ENCOUNTER — Ambulatory Visit: Payer: Medicaid Other | Admitting: Internal Medicine

## 2015-06-04 ENCOUNTER — Other Ambulatory Visit: Payer: Self-pay | Admitting: *Deleted

## 2015-06-04 MED ORDER — APIXABAN 5 MG PO TABS: 5.0000 mg | ORAL_TABLET | Freq: Two times a day (BID) | ORAL | Status: DC

## 2015-06-04 NOTE — Telephone Encounter (Signed)
Patient was given 2 sample boxes of Eliquis 5mg 

## 2015-06-15 ENCOUNTER — Telehealth: Payer: Self-pay | Admitting: Cardiovascular Disease

## 2015-06-15 NOTE — Telephone Encounter (Signed)
Spoke with patient who states he received a letter from [facility] advising him that he will need to stop eliquis for an upcoming procedure.  He states he does not know what procedure he is supposed to have.  I advised him that he will need to call their office to find out what procedure he is scheduled for and that Dr. Acie Fredrickson will be able to sign clearance for holding eliquis at that time.  He verbalized understanding and agreement.

## 2015-06-15 NOTE — Telephone Encounter (Signed)
New Message    Pt calling stating that the pain clinic wants him to come off his Eliquis for a couple of days for a procedure, pt states he doesn't know what procedure it is for. I attempted to explain to the pt that the pain clinic would need to contact our office directly for clearance to stop medication for a procedure either via telephone or via fax and pt didn't understand what I was saying. Please call bacfk and advise.

## 2015-06-23 ENCOUNTER — Inpatient Hospital Stay (HOSPITAL_COMMUNITY)
Admission: EM | Admit: 2015-06-23 | Discharge: 2015-06-25 | DRG: 191 | Disposition: A | Discharge status: Home / Self Care | Payer: Medicaid Other | Attending: Oncology | Admitting: Oncology

## 2015-06-23 ENCOUNTER — Emergency Department (HOSPITAL_COMMUNITY): Payer: Medicaid Other

## 2015-06-23 ENCOUNTER — Encounter (HOSPITAL_COMMUNITY): Payer: Self-pay

## 2015-06-23 ENCOUNTER — Telehealth: Payer: Self-pay | Admitting: Cardiovascular Disease

## 2015-06-23 DIAGNOSIS — J440 Chronic obstructive pulmonary disease with acute lower respiratory infection: Secondary | ICD-10-CM | POA: Diagnosis present

## 2015-06-23 DIAGNOSIS — E1165 Type 2 diabetes mellitus with hyperglycemia: Secondary | ICD-10-CM | POA: Diagnosis not present

## 2015-06-23 DIAGNOSIS — R809 Proteinuria, unspecified: Secondary | ICD-10-CM | POA: Diagnosis present

## 2015-06-23 DIAGNOSIS — Z794 Long term (current) use of insulin: Secondary | ICD-10-CM | POA: Diagnosis not present

## 2015-06-23 DIAGNOSIS — M797 Fibromyalgia: Secondary | ICD-10-CM | POA: Diagnosis present

## 2015-06-23 DIAGNOSIS — E785 Hyperlipidemia, unspecified: Secondary | ICD-10-CM | POA: Diagnosis present

## 2015-06-23 DIAGNOSIS — N189 Chronic kidney disease, unspecified: Secondary | ICD-10-CM | POA: Insufficient documentation

## 2015-06-23 DIAGNOSIS — F17210 Nicotine dependence, cigarettes, uncomplicated: Secondary | ICD-10-CM | POA: Diagnosis present

## 2015-06-23 DIAGNOSIS — I482 Chronic atrial fibrillation: Secondary | ICD-10-CM | POA: Diagnosis present

## 2015-06-23 DIAGNOSIS — J208 Acute bronchitis due to other specified organisms: Secondary | ICD-10-CM | POA: Diagnosis present

## 2015-06-23 DIAGNOSIS — B1920 Unspecified viral hepatitis C without hepatic coma: Secondary | ICD-10-CM | POA: Diagnosis present

## 2015-06-23 DIAGNOSIS — J441 Chronic obstructive pulmonary disease with (acute) exacerbation: Secondary | ICD-10-CM | POA: Diagnosis not present

## 2015-06-23 DIAGNOSIS — Z8249 Family history of ischemic heart disease and other diseases of the circulatory system: Secondary | ICD-10-CM

## 2015-06-23 DIAGNOSIS — Z7901 Long term (current) use of anticoagulants: Secondary | ICD-10-CM | POA: Diagnosis not present

## 2015-06-23 DIAGNOSIS — M069 Rheumatoid arthritis, unspecified: Secondary | ICD-10-CM | POA: Diagnosis present

## 2015-06-23 DIAGNOSIS — N183 Chronic kidney disease, stage 3 (moderate): Secondary | ICD-10-CM

## 2015-06-23 DIAGNOSIS — J189 Pneumonia, unspecified organism: Secondary | ICD-10-CM

## 2015-06-23 DIAGNOSIS — R0600 Dyspnea, unspecified: Secondary | ICD-10-CM

## 2015-06-23 DIAGNOSIS — J45909 Unspecified asthma, uncomplicated: Secondary | ICD-10-CM | POA: Diagnosis present

## 2015-06-23 DIAGNOSIS — F419 Anxiety disorder, unspecified: Secondary | ICD-10-CM | POA: Diagnosis present

## 2015-06-23 DIAGNOSIS — E1159 Type 2 diabetes mellitus with other circulatory complications: Secondary | ICD-10-CM

## 2015-06-23 DIAGNOSIS — E1121 Type 2 diabetes mellitus with diabetic nephropathy: Secondary | ICD-10-CM | POA: Diagnosis present

## 2015-06-23 DIAGNOSIS — E1122 Type 2 diabetes mellitus with diabetic chronic kidney disease: Secondary | ICD-10-CM | POA: Diagnosis present

## 2015-06-23 DIAGNOSIS — Z7951 Long term (current) use of inhaled steroids: Secondary | ICD-10-CM | POA: Diagnosis not present

## 2015-06-23 DIAGNOSIS — N184 Chronic kidney disease, stage 4 (severe): Secondary | ICD-10-CM | POA: Diagnosis present

## 2015-06-23 DIAGNOSIS — J069 Acute upper respiratory infection, unspecified: Secondary | ICD-10-CM | POA: Insufficient documentation

## 2015-06-23 DIAGNOSIS — N179 Acute kidney failure, unspecified: Secondary | ICD-10-CM | POA: Diagnosis present

## 2015-06-23 DIAGNOSIS — J449 Chronic obstructive pulmonary disease, unspecified: Secondary | ICD-10-CM | POA: Diagnosis not present

## 2015-06-23 DIAGNOSIS — Z79899 Other long term (current) drug therapy: Secondary | ICD-10-CM | POA: Diagnosis not present

## 2015-06-23 DIAGNOSIS — M1990 Unspecified osteoarthritis, unspecified site: Secondary | ICD-10-CM | POA: Diagnosis present

## 2015-06-23 DIAGNOSIS — E860 Dehydration: Secondary | ICD-10-CM | POA: Diagnosis present

## 2015-06-23 DIAGNOSIS — K219 Gastro-esophageal reflux disease without esophagitis: Secondary | ICD-10-CM | POA: Diagnosis present

## 2015-06-23 DIAGNOSIS — I872 Venous insufficiency (chronic) (peripheral): Secondary | ICD-10-CM | POA: Diagnosis present

## 2015-06-23 DIAGNOSIS — I129 Hypertensive chronic kidney disease with stage 1 through stage 4 chronic kidney disease, or unspecified chronic kidney disease: Secondary | ICD-10-CM | POA: Diagnosis present

## 2015-06-23 DIAGNOSIS — I480 Paroxysmal atrial fibrillation: Secondary | ICD-10-CM | POA: Diagnosis present

## 2015-06-23 DIAGNOSIS — I4891 Unspecified atrial fibrillation: Secondary | ICD-10-CM

## 2015-06-23 DIAGNOSIS — R509 Fever, unspecified: Secondary | ICD-10-CM

## 2015-06-23 DIAGNOSIS — I10 Essential (primary) hypertension: Secondary | ICD-10-CM | POA: Diagnosis present

## 2015-06-23 DIAGNOSIS — R319 Hematuria, unspecified: Secondary | ICD-10-CM | POA: Diagnosis present

## 2015-06-23 DIAGNOSIS — F319 Bipolar disorder, unspecified: Secondary | ICD-10-CM | POA: Diagnosis present

## 2015-06-23 DIAGNOSIS — E039 Hypothyroidism, unspecified: Secondary | ICD-10-CM | POA: Insufficient documentation

## 2015-06-23 DIAGNOSIS — Z85850 Personal history of malignant neoplasm of thyroid: Secondary | ICD-10-CM | POA: Diagnosis not present

## 2015-06-23 DIAGNOSIS — G8929 Other chronic pain: Secondary | ICD-10-CM | POA: Diagnosis present

## 2015-06-23 DIAGNOSIS — Z6834 Body mass index (BMI) 34.0-34.9, adult: Secondary | ICD-10-CM | POA: Diagnosis not present

## 2015-06-23 DIAGNOSIS — E669 Obesity, unspecified: Secondary | ICD-10-CM | POA: Diagnosis present

## 2015-06-23 DIAGNOSIS — F17200 Nicotine dependence, unspecified, uncomplicated: Secondary | ICD-10-CM | POA: Diagnosis present

## 2015-06-23 DIAGNOSIS — E1142 Type 2 diabetes mellitus with diabetic polyneuropathy: Secondary | ICD-10-CM | POA: Diagnosis present

## 2015-06-23 DIAGNOSIS — Z85820 Personal history of malignant melanoma of skin: Secondary | ICD-10-CM

## 2015-06-23 LAB — GLUCOSE, CAPILLARY
GLUCOSE-CAPILLARY: 197 mg/dL — AB (ref 65–99)
GLUCOSE-CAPILLARY: 443 mg/dL — AB (ref 65–99)
Glucose-Capillary: 484 mg/dL — ABNORMAL HIGH (ref 65–99)
Glucose-Capillary: 454 mg/dL — ABNORMAL HIGH (ref 65–99)

## 2015-06-23 LAB — CBC WITH DIFFERENTIAL/PLATELET
BASOS PCT: 0 %
Basophils Absolute: 0 10*3/uL (ref 0.0–0.1)
EOS ABS: 0 10*3/uL (ref 0.0–0.7)
EOS PCT: 0 %
HCT: 33.3 % — ABNORMAL LOW (ref 39.0–52.0)
HEMOGLOBIN: 11.5 g/dL — AB (ref 13.0–17.0)
LYMPHS ABS: 1.5 10*3/uL (ref 0.7–4.0)
Lymphocytes Relative: 12 %
MCH: 29.3 pg (ref 26.0–34.0)
MCHC: 34.5 g/dL (ref 30.0–36.0)
MCV: 84.7 fL (ref 78.0–100.0)
MONO ABS: 1.5 10*3/uL — AB (ref 0.1–1.0)
MONOS PCT: 12 %
NEUTROS PCT: 76 %
Neutro Abs: 9.8 10*3/uL — ABNORMAL HIGH (ref 1.7–7.7)
Platelets: 154 10*3/uL (ref 150–400)
RBC: 3.93 MIL/uL — ABNORMAL LOW (ref 4.22–5.81)
RDW: 14.5 % (ref 11.5–15.5)
WBC: 12.7 10*3/uL — ABNORMAL HIGH (ref 4.0–10.5)

## 2015-06-23 LAB — URINALYSIS, ROUTINE W REFLEX MICROSCOPIC
BILIRUBIN URINE: NEGATIVE
Glucose, UA: 100 mg/dL — AB
KETONES UR: 15 mg/dL — AB
Leukocytes, UA: NEGATIVE
NITRITE: NEGATIVE
Protein, ur: >300 mg/dL — AB
SPECIFIC GRAVITY, URINE: 1.023 (ref 1.005–1.030)
pH: 7 (ref 5.0–8.0)

## 2015-06-23 LAB — COMPREHENSIVE METABOLIC PANEL
ALBUMIN: 2.6 g/dL — AB (ref 3.5–5.0)
ALK PHOS: 46 U/L (ref 38–126)
ALT: 15 U/L — AB (ref 17–63)
AST: 40 U/L (ref 15–41)
Anion gap: 12 (ref 5–15)
BUN: 36 mg/dL — AB (ref 6–20)
CALCIUM: 8.6 mg/dL — AB (ref 8.9–10.3)
CHLORIDE: 104 mmol/L (ref 101–111)
CO2: 20 mmol/L — AB (ref 22–32)
CREATININE: 4.14 mg/dL — AB (ref 0.61–1.24)
GFR calc non Af Amer: 14 mL/min — ABNORMAL LOW (ref >60)
GFR, EST AFRICAN AMERICAN: 17 mL/min — AB (ref >60)
GLUCOSE: 142 mg/dL — AB (ref 65–99)
Potassium: 3.6 mmol/L (ref 3.5–5.1)
SODIUM: 136 mmol/L (ref 135–145)
Total Bilirubin: 0.7 mg/dL (ref 0.3–1.2)
Total Protein: 5.9 g/dL — ABNORMAL LOW (ref 6.5–8.1)

## 2015-06-23 LAB — URINE MICROSCOPIC-ADD ON

## 2015-06-23 LAB — I-STAT CG4 LACTIC ACID, ED: LACTIC ACID, VENOUS: 0.72 mmol/L (ref 0.5–2.0)

## 2015-06-23 LAB — TSH: TSH: 0.666 u[IU]/mL (ref 0.350–4.500)

## 2015-06-23 LAB — MRSA PCR SCREENING: MRSA BY PCR: NEGATIVE

## 2015-06-23 LAB — BRAIN NATRIURETIC PEPTIDE: B NATRIURETIC PEPTIDE 5: 420.3 pg/mL — AB (ref 0.0–100.0)

## 2015-06-23 MED ORDER — DILTIAZEM HCL 100 MG IV SOLR
10.0000 mg/h | Freq: Once | INTRAVENOUS | Status: AC
  Administered 2015-06-23: 10 mg/h via INTRAVENOUS
  Filled 2015-06-23: qty 100

## 2015-06-23 MED ORDER — SODIUM CHLORIDE 0.9 % IV SOLN
INTRAVENOUS | Status: AC
  Administered 2015-06-23: 12:00:00 via INTRAVENOUS

## 2015-06-23 MED ORDER — METOPROLOL TARTRATE 50 MG PO TABS
50.0000 mg | ORAL_TABLET | Freq: Every evening | ORAL | Status: DC
  Administered 2015-06-23 – 2015-06-24 (×2): 50 mg via ORAL
  Filled 2015-06-23 (×2): qty 1

## 2015-06-23 MED ORDER — MORPHINE SULFATE (PF) 4 MG/ML IV SOLN
4.0000 mg | Freq: Once | INTRAVENOUS | Status: AC
  Administered 2015-06-23: 4 mg via INTRAVENOUS
  Filled 2015-06-23: qty 1

## 2015-06-23 MED ORDER — DILTIAZEM LOAD VIA INFUSION
20.0000 mg | Freq: Once | INTRAVENOUS | Status: AC
  Administered 2015-06-23: 20 mg via INTRAVENOUS
  Filled 2015-06-23: qty 20

## 2015-06-23 MED ORDER — ALBUTEROL SULFATE (2.5 MG/3ML) 0.083% IN NEBU
2.5000 mg | INHALATION_SOLUTION | RESPIRATORY_TRACT | Status: DC | PRN
  Administered 2015-06-24: 2.5 mg via RESPIRATORY_TRACT
  Filled 2015-06-23: qty 3

## 2015-06-23 MED ORDER — LEVOFLOXACIN 750 MG PO TABS
750.0000 mg | ORAL_TABLET | ORAL | Status: DC
  Administered 2015-06-24: 750 mg via ORAL
  Filled 2015-06-23: qty 1

## 2015-06-23 MED ORDER — GUAIFENESIN ER 600 MG PO TB12
600.0000 mg | ORAL_TABLET | Freq: Two times a day (BID) | ORAL | Status: DC
  Administered 2015-06-23 – 2015-06-25 (×5): 600 mg via ORAL
  Filled 2015-06-23 (×5): qty 1

## 2015-06-23 MED ORDER — NICOTINE 21 MG/24HR TD PT24
21.0000 mg | MEDICATED_PATCH | Freq: Every day | TRANSDERMAL | Status: DC
  Administered 2015-06-24 – 2015-06-25 (×2): 21 mg via TRANSDERMAL
  Filled 2015-06-23 (×2): qty 1

## 2015-06-23 MED ORDER — ONDANSETRON HCL 4 MG PO TABS
4.0000 mg | ORAL_TABLET | Freq: Four times a day (QID) | ORAL | Status: DC | PRN
  Administered 2015-06-24: 4 mg via ORAL
  Filled 2015-06-23: qty 1

## 2015-06-23 MED ORDER — DEXTROSE 5 % IV SOLN
1.0000 g | Freq: Once | INTRAVENOUS | Status: AC
  Administered 2015-06-23: 1 g via INTRAVENOUS
  Filled 2015-06-23: qty 10

## 2015-06-23 MED ORDER — FUROSEMIDE 80 MG PO TABS: 80.0000 mg | ORAL_TABLET | Freq: Every day | ORAL | Status: DC

## 2015-06-23 MED ORDER — APIXABAN 5 MG PO TABS
5.0000 mg | ORAL_TABLET | Freq: Two times a day (BID) | ORAL | Status: DC
  Administered 2015-06-23 – 2015-06-25 (×4): 5 mg via ORAL
  Filled 2015-06-23 (×4): qty 1

## 2015-06-23 MED ORDER — IPRATROPIUM-ALBUTEROL 0.5-2.5 (3) MG/3ML IN SOLN
3.0000 mL | Freq: Four times a day (QID) | RESPIRATORY_TRACT | Status: DC
  Administered 2015-06-23: 3 mL via RESPIRATORY_TRACT
  Filled 2015-06-23: qty 3

## 2015-06-23 MED ORDER — PREDNISONE 20 MG PO TABS
40.0000 mg | ORAL_TABLET | Freq: Every day | ORAL | Status: DC
  Administered 2015-06-24: 40 mg via ORAL
  Filled 2015-06-23: qty 2

## 2015-06-23 MED ORDER — FLUTICASONE PROPIONATE 50 MCG/ACT NA SUSP
2.0000 | Freq: Every day | NASAL | Status: DC
  Filled 2015-06-23: qty 16

## 2015-06-23 MED ORDER — TIOTROPIUM BROMIDE MONOHYDRATE 18 MCG IN CAPS
18.0000 ug | ORAL_CAPSULE | Freq: Every day | RESPIRATORY_TRACT | Status: DC
  Filled 2015-06-23: qty 5

## 2015-06-23 MED ORDER — SIMVASTATIN 20 MG PO TABS
20.0000 mg | ORAL_TABLET | Freq: Every day | ORAL | Status: DC
  Administered 2015-06-23 – 2015-06-24 (×2): 20 mg via ORAL
  Filled 2015-06-23 (×2): qty 1

## 2015-06-23 MED ORDER — GUAIFENESIN-DM 100-10 MG/5ML PO SYRP
5.0000 mL | ORAL_SOLUTION | ORAL | Status: DC | PRN
  Administered 2015-06-23 – 2015-06-24 (×3): 5 mL via ORAL
  Filled 2015-06-23 (×3): qty 5

## 2015-06-23 MED ORDER — OXYCODONE HCL 5 MG PO TABS
15.0000 mg | ORAL_TABLET | Freq: Three times a day (TID) | ORAL | Status: DC | PRN
  Administered 2015-06-23 – 2015-06-25 (×5): 15 mg via ORAL
  Filled 2015-06-23 (×5): qty 3

## 2015-06-23 MED ORDER — LISINOPRIL 20 MG PO TABS: 20.0000 mg | ORAL_TABLET | Freq: Every day | ORAL | Status: DC

## 2015-06-23 MED ORDER — DILTIAZEM HCL 100 MG IV SOLR
5.0000 mg/h | INTRAVENOUS | Status: DC
  Administered 2015-06-23: 15 mg/h via INTRAVENOUS
  Administered 2015-06-24: 5 mg/h via INTRAVENOUS
  Filled 2015-06-23 (×2): qty 100

## 2015-06-23 MED ORDER — ALBUTEROL SULFATE HFA 108 (90 BASE) MCG/ACT IN AERS: 2.0000 | INHALATION_SPRAY | RESPIRATORY_TRACT | Status: DC | PRN

## 2015-06-23 MED ORDER — ONDANSETRON HCL 4 MG/2ML IJ SOLN: 4.0000 mg | Freq: Four times a day (QID) | INTRAMUSCULAR | Status: DC | PRN

## 2015-06-23 MED ORDER — METHYLPREDNISOLONE SODIUM SUCC 125 MG IJ SOLR
80.0000 mg | Freq: Once | INTRAMUSCULAR | Status: AC
  Administered 2015-06-23: 80 mg via INTRAVENOUS
  Filled 2015-06-23: qty 2

## 2015-06-23 MED ORDER — SODIUM CHLORIDE 0.9 % IJ SOLN
3.0000 mL | Freq: Two times a day (BID) | INTRAMUSCULAR | Status: DC
  Administered 2015-06-24 – 2015-06-25 (×3): 3 mL via INTRAVENOUS

## 2015-06-23 MED ORDER — DEXTROSE 5 % IV SOLN: 500.0000 mg | INTRAVENOUS | Status: DC

## 2015-06-23 MED ORDER — DIAZEPAM 5 MG PO TABS
5.0000 mg | ORAL_TABLET | Freq: Four times a day (QID) | ORAL | Status: DC | PRN
Start: 2015-06-23 — End: 2015-06-25
  Administered 2015-06-23 – 2015-06-25 (×5): 5 mg via ORAL
  Filled 2015-06-23 (×5): qty 1

## 2015-06-23 MED ORDER — DEXTROSE 5 % IV SOLN
500.0000 mg | Freq: Once | INTRAVENOUS | Status: AC
  Administered 2015-06-23: 500 mg via INTRAVENOUS
  Filled 2015-06-23: qty 500

## 2015-06-23 MED ORDER — METOPROLOL TARTRATE 100 MG PO TABS
100.0000 mg | ORAL_TABLET | Freq: Every morning | ORAL | Status: DC
  Administered 2015-06-24 – 2015-06-25 (×2): 100 mg via ORAL
  Filled 2015-06-23 (×2): qty 1

## 2015-06-23 MED ORDER — INSULIN GLARGINE 100 UNIT/ML SOLOSTAR PEN: 30.0000 [IU] | PEN_INJECTOR | Freq: Every day | SUBCUTANEOUS | Status: DC

## 2015-06-23 MED ORDER — DILTIAZEM HCL 25 MG/5ML IV SOLN
20.0000 mg | Freq: Once | INTRAVENOUS | Status: AC
  Administered 2015-06-23: 20 mg via INTRAVENOUS
  Filled 2015-06-23: qty 5

## 2015-06-23 MED ORDER — ALBUTEROL SULFATE (2.5 MG/3ML) 0.083% IN NEBU
5.0000 mg | INHALATION_SOLUTION | Freq: Once | RESPIRATORY_TRACT | Status: AC
  Administered 2015-06-23: 5 mg via RESPIRATORY_TRACT
  Filled 2015-06-23: qty 6

## 2015-06-23 MED ORDER — INSULIN ASPART 100 UNIT/ML ~~LOC~~ SOLN: 0.0000 [IU] | Freq: Three times a day (TID) | SUBCUTANEOUS | Status: DC

## 2015-06-23 MED ORDER — PANTOPRAZOLE SODIUM 40 MG PO TBEC
40.0000 mg | DELAYED_RELEASE_TABLET | Freq: Every day | ORAL | Status: DC
  Administered 2015-06-24 – 2015-06-25 (×2): 40 mg via ORAL
  Filled 2015-06-23 (×2): qty 1

## 2015-06-23 MED ORDER — SODIUM CHLORIDE 0.9 % IV BOLUS (SEPSIS)
1000.0000 mL | Freq: Once | INTRAVENOUS | Status: AC
  Administered 2015-06-23: 1000 mL via INTRAVENOUS

## 2015-06-23 MED ORDER — INSULIN GLARGINE 100 UNIT/ML ~~LOC~~ SOLN
23.0000 [IU] | Freq: Every day | SUBCUTANEOUS | Status: DC
  Administered 2015-06-23: 23 [IU] via SUBCUTANEOUS
  Filled 2015-06-23: qty 0.23

## 2015-06-23 MED ORDER — INSULIN GLARGINE 100 UNIT/ML SOLOSTAR PEN: 23.0000 [IU] | PEN_INJECTOR | Freq: Every day | SUBCUTANEOUS | Status: DC

## 2015-06-23 MED ORDER — CEFTRIAXONE SODIUM 1 G IJ SOLR: 1.0000 g | INTRAMUSCULAR | Status: DC

## 2015-06-23 MED ORDER — IPRATROPIUM-ALBUTEROL 0.5-2.5 (3) MG/3ML IN SOLN
3.0000 mL | Freq: Three times a day (TID) | RESPIRATORY_TRACT | Status: DC
  Administered 2015-06-24 – 2015-06-25 (×3): 3 mL via RESPIRATORY_TRACT
  Filled 2015-06-23 (×4): qty 3

## 2015-06-23 MED ORDER — ALBUTEROL SULFATE (2.5 MG/3ML) 0.083% IN NEBU
5.0000 mg | INHALATION_SOLUTION | Freq: Once | RESPIRATORY_TRACT | Status: AC
Start: 2015-06-23 — End: 2015-06-23
  Administered 2015-06-23: 5 mg via RESPIRATORY_TRACT
  Filled 2015-06-23: qty 6

## 2015-06-23 MED ORDER — ALBUTEROL SULFATE (2.5 MG/3ML) 0.083% IN NEBU: 5.0000 mg | INHALATION_SOLUTION | RESPIRATORY_TRACT | Status: DC | PRN

## 2015-06-23 MED ORDER — ONDANSETRON HCL 4 MG/2ML IJ SOLN
4.0000 mg | Freq: Once | INTRAMUSCULAR | Status: AC
  Administered 2015-06-23: 4 mg via INTRAVENOUS
  Filled 2015-06-23: qty 2

## 2015-06-23 MED ORDER — ALBUTEROL SULFATE (2.5 MG/3ML) 0.083% IN NEBU: 2.5000 mg | INHALATION_SOLUTION | RESPIRATORY_TRACT | Status: DC | PRN

## 2015-06-23 MED ORDER — LEVOTHYROXINE SODIUM 88 MCG PO TABS
88.0000 ug | ORAL_TABLET | Freq: Every day | ORAL | Status: DC
  Administered 2015-06-24 – 2015-06-25 (×2): 88 ug via ORAL
  Filled 2015-06-23 (×2): qty 1

## 2015-06-23 MED ORDER — IPRATROPIUM BROMIDE 0.02 % IN SOLN
0.5000 mg | Freq: Once | RESPIRATORY_TRACT | Status: AC
  Administered 2015-06-23: 0.5 mg via RESPIRATORY_TRACT
  Filled 2015-06-23: qty 2.5

## 2015-06-23 NOTE — Plan of Care (Signed)
Problem: Education: Goal: Knowledge of [facility] Education information/materials will improve Outcome: Completed/Met Date Met:  06/23/15 Pt educated on unit policies. States understanding

## 2015-06-23 NOTE — ED Provider Notes (Addendum)
CSN: [id number]     Arrival date & time 06/23/15  [id number] History   First MD Initiated Contact with Patient 06/23/15 [phone]     Chief Complaint  Patient presents with  . URI     (Consider location/radiation/quality/duration/timing/severity/associated sxs/prior Treatment) Patient is a 59 y.o. male presenting with URI. The history is provided by the patient.  URI Presenting symptoms: cough and fever   Presenting symptoms: no sore throat   Associated symptoms: wheezing   Associated symptoms: no headaches and no neck pain   Patient w hx afib, copd, c/o fevers onset 4 days ago, also c/o body aches, productive cough w yellowish brown sputum, and sob.  Symptoms constant, persistent since. No known ill contacts. Had flu shot 2 months ago.  Denies chest pain or discomfort. Hx afib, states takes eliquis regularly, no abn bruising or bleeding. Fevers to 103. + chills.       Past Medical History  Diagnosis Date  . PAF (paroxysmal atrial fibrillation) ([place])   . Diabetes mellitus   . Hypothyroidism   . Hyperlipidemia   . Hypertension   . Chronic pain   . OA (osteoarthritis)   . COPD (chronic obstructive pulmonary disease) ([place])   . RA (rheumatoid arthritis) ([place])   . Hepatitis C   . Venous stasis   . Bipolar 1 disorder ([place])   . Hepatitis C   . Fibromyalgia   . Asthma   . Anxiety   . Melanoma ([place])     face, shoulder arm  . Pancreatitis, acute 2015  . GERD (gastroesophageal reflux disease)   . CKD (chronic kidney disease), stage II    Past Surgical History  Procedure Laterality Date  . Thyroidectomy    . Tonsillectomy    . US echocardiography  02/19/2010    EF 55-60%  . Skin cancer excision      melonoma  . Fracture surgery Left     arm  . Submandibular gland excision Left 12/25/2013    w/sialolithotomy  . Inguinal hernia repair Bilateral   . Hernia repair      umbicial  . Knee surgery      /H&P 02/20/2010  . Submandibular gland excision Left 12/25/2013    Procedure:  SUBMANDIBULAR GLAND STONE AND CHRONIC SIALOLITHIASIS EXCISION;  Surgeon: Melida Quitter, MD;  Location: [facility];  Service: ENT;  Laterality: Left;  . Amputation Left 11/11/2014    Procedure: FOOT, SECOND [place] AMPUTATION;  Surgeon: Newt Minion, MD;  Location: [facility];  Service: Orthopedics;  Laterality: Left;  . Cardioversion  02/03/2015  . Cardioversion N/A 02/03/2015    Procedure: CARDIOVERSION;  Surgeon: Thayer Headings, MD;  Location: [facility];  Service: Cardiovascular;  Laterality: N/A;   Family History  Problem Relation Age of Onset  . Hypertension Mother   . Alzheimer's disease Mother   . Heart disease Father    Social History  Substance Use Topics  . Smoking status: Current Every Day Smoker -- 1.00 packs/day for 43 years    Types: Cigarettes  . Smokeless tobacco: Never Used  . Alcohol Use: Yes     Comment: rare    Review of Systems  Constitutional: Positive for fever and chills.  HENT: Negative for sore throat.   Eyes: Negative for discharge and redness.  Respiratory: Positive for cough, shortness of breath and wheezing.   Cardiovascular: Negative for chest pain.  Gastrointestinal: Negative for vomiting, abdominal pain and diarrhea.  Genitourinary: Negative for dysuria and flank pain.  Musculoskeletal: Negative  for back pain and neck pain.  Skin: Negative for rash.  Neurological: Negative for headaches.  Hematological: Does not bruise/bleed easily.  Psychiatric/Behavioral: Negative for confusion.      Allergies  Novolog; Tylenol; Antihistamines, chlorpheniramine-type; Iodinated diagnostic agents; Lidocaine; Other; Pentazocine lactate; Sulfa antibiotics; and Vicodin  Home Medications   Prior to Admission medications   Medication Sig Start Date End Date Taking? Authorizing Provider  albuterol (PROVENTIL HFA;VENTOLIN HFA) 108 (90 BASE) MCG/ACT inhaler Inhale 2 puffs into the lungs every 4 (four) hours as needed for wheezing or shortness of breath.    Historical Provider,  MD  apixaban (ELIQUIS) 5 MG TABS tablet Take 1 tablet (5 mg total) by mouth 2 (two) times daily. 06/04/15   Thayer Headings, MD  calcitRIOL (ROCALTROL) 0.25 MCG capsule Take 1 capsule (0.25 mcg total) by mouth daily. 11/17/14   Delfina Redwood, MD  diazepam (VALIUM) 10 MG tablet Take 10 mg by mouth 3 (three) times daily. For anxiety    Historical Provider, MD  fluticasone (FLONASE) 50 MCG/ACT nasal spray Place 1 spray into both nostrils daily as needed for allergies.     Historical Provider, MD  furosemide (LASIX) 80 MG tablet Take 1 tablet (80 mg total) by mouth daily. 04/13/15   Thayer Headings, MD  Insulin Glargine (LANTUS SOLOSTAR) 100 UNIT/ML Solostar Pen Inject 28 Units into the skin daily at 10 pm. 03/06/15   Lance Bosch, NP  Ledipasvir-Sofosbuvir (HARVONI) 90-400 MG TABS Take 1 tablet by mouth daily. 02/26/15   Thayer Headings, MD  levothyroxine (SYNTHROID) 88 MCG tablet Take 1 tablet (88 mcg total) by mouth daily before breakfast. Will send additional refills based off blood level 03/06/15   Lance Bosch, NP  lisinopril (PRINIVIL,ZESTRIL) 20 MG tablet Take 1 tablet (20 mg total) by mouth daily. 03/26/15   Thayer Headings, MD  metoprolol (LOPRESSOR) 50 MG tablet Take 1 tablet (50 mg total) by mouth 3 (three) times daily. 03/26/15   Thayer Headings, MD  Multiple Vitamin (MULTIVITAMIN) tablet Take 1 tablet by mouth every morning.     Historical Provider, MD  nicotine (NICODERM CQ - DOSED IN MG/24 HOURS) 14 mg/24hr patch Place 1 patch (14 mg total) onto the skin daily. 02/03/15   Thayer Headings, MD  omeprazole (PRILOSEC) 20 MG capsule Take 1 capsule (20 mg total) by mouth daily. Take with Harvoni at the same time under FASTING condition 03/06/15   Lance Bosch, NP  oxycodone (ROXICODONE) 30 MG immediate release tablet Take 30 mg by mouth every 6 (six) hours as needed for pain.    Historical Provider, MD  Rivaroxaban (XARELTO) 15 MG TABS tablet Take 1 tablet (15 mg total) by mouth daily. 04/07/15    Thayer Headings, MD  simvastatin (ZOCOR) 20 MG tablet Take 1 tablet (20 mg total) by mouth daily at 6 PM. 03/06/15   Lance Bosch, NP  SPIRIVA HANDIHALER 18 MCG inhalation capsule INHALE CONTENTS OF ONE CAPSULE ONCE A DAY 04/01/15   Thayer Headings, MD   BP 160/80 mmHg  Pulse 140  Temp(Src) 103 F (39.4 C) (Oral)  Resp 25  Ht 6\' 6"  (1.981 m)  Wt 136.986 kg  BMI 34.91 kg/m2  SpO2 98% Physical Exam  Constitutional: He is oriented to person, place, and time. He appears well-developed and well-nourished. No distress.  HENT:  Nose: Nose normal.  Mouth/Throat: Oropharynx is clear and moist.  Eyes: Conjunctivae are normal.  Neck: Neck supple.  No tracheal deviation present.  Cardiovascular: Normal heart sounds and intact distal pulses.  Exam reveals no gallop and no friction rub.   No murmur heard. Tachycardic.   Pulmonary/Chest: Effort normal. No accessory muscle usage. No respiratory distress. He has wheezes.  Rhonchi.   Abdominal: Soft. Bowel sounds are normal. He exhibits no distension. There is no tenderness.  Genitourinary:  No cva tenderness  Musculoskeletal: Normal range of motion.  Neurological: He is alert and oriented to person, place, and time.  Skin: Skin is warm and dry. No rash noted. He is not diaphoretic.  Psychiatric: He has a normal mood and affect.  Nursing note and vitals reviewed.   ED Course  Procedures (including critical care time) Labs Review  Results for orders placed or performed during the hospital encounter of 06/23/15  Comprehensive metabolic panel  Result Value Ref Range   Sodium 136 135 - 145 mmol/L   Potassium 3.6 3.5 - 5.1 mmol/L   Chloride 104 101 - 111 mmol/L   CO2 20 (L) 22 - 32 mmol/L   Glucose, Bld 142 (H) 65 - 99 mg/dL   BUN 36 (H) 6 - 20 mg/dL   Creatinine, Ser 4.14 (H) 0.61 - 1.24 mg/dL   Calcium 8.6 (L) 8.9 - 10.3 mg/dL   Total Protein 5.9 (L) 6.5 - 8.1 g/dL   Albumin 2.6 (L) 3.5 - 5.0 g/dL   AST 40 15 - 41 U/L   ALT 15 (L) 17 -  63 U/L   Alkaline Phosphatase 46 38 - 126 U/L   Total Bilirubin 0.7 0.3 - 1.2 mg/dL   GFR calc non Af Amer 14 (L) >60 mL/min   GFR calc Af Amer 17 (L) >60 mL/min   Anion gap 12 5 - 15  CBC WITH DIFFERENTIAL  Result Value Ref Range   WBC 12.7 (H) 4.0 - 10.5 K/uL   RBC 3.93 (L) 4.22 - 5.81 MIL/uL   Hemoglobin 11.5 (L) 13.0 - 17.0 g/dL   HCT 33.3 (L) 39.0 - 52.0 %   MCV 84.7 78.0 - 100.0 fL   MCH 29.3 26.0 - 34.0 pg   MCHC 34.5 30.0 - 36.0 g/dL   RDW 14.5 11.5 - 15.5 %   Platelets 154 150 - 400 K/uL   Neutrophils Relative % 76 %   Neutro Abs 9.8 (H) 1.7 - 7.7 K/uL   Lymphocytes Relative 12 %   Lymphs Abs 1.5 0.7 - 4.0 K/uL   Monocytes Relative 12 %   Monocytes Absolute 1.5 (H) 0.1 - 1.0 K/uL   Eosinophils Relative 0 %   Eosinophils Absolute 0.0 0.0 - 0.7 K/uL   Basophils Relative 0 %   Basophils Absolute 0.0 0.0 - 0.1 K/uL  I-Stat CG4 Lactic Acid, ED  (not at  [facility])  Result Value Ref Range   Lactic Acid, Venous 0.72 0.5 - 2.0 mmol/L   Dg Chest 2 View  06/23/2015  CLINICAL DATA:  Cough and fever for 4 days EXAM: CHEST  2 VIEW COMPARISON:  January 30, 2015 FINDINGS: There is diffuse interstitial edema. There is no appreciable airspace consolidation. Heart is borderline enlarged with slight pulmonary venous hypertension. No adenopathy. No bone lesions. IMPRESSION: Findings most consistent with a degree of congestive heart failure. No airspace consolidation appreciable. No adenopathy apparent. Electronically Signed   By: Lowella Grip III M.D.   On: 06/23/2015 10:08      I have personally reviewed and evaluated these images and lab results as part of my medical  decision-making.   EKG Interpretation   Date/Time:  Tuesday June 23 2015 09:04:35 EST Ventricular Rate:  145 PR Interval:    QRS Duration: 109 QT Interval:  324 QTC Calculation: 503 R Axis:   69 Text Interpretation:  Atrial fibrillation with rapid ventricular response  Nonspecific ST abnormality Confirmed by  Ashok Cordia  MD, Lennette Bihari ([id number]) on  06/23/2015 9:11:15 AM      MDM   Iv ns. Labs. Monitor. Ecg. Cxr.  Reviewed nursing notes and prior charts for additional history.   1 liter ns bolus.  Albuterol and atrovent neb.   Pt requests ivf, states feels dehydrated. Ivf.  Cultures sent.  Iv abx.  Pt w fever, rhonchi/congestion, suspect pna.   Afib, rvr.  Remains tachy. cardizem iv bolus/gtt.  Persistent wheezing bil. Solumedrol iv. Albuterol and atrovent neb.  Med service contacted for admission.   CRITICAL CARE  Afib w rapid ventricular response requiring iv meds/gtt, dyspnea, fever, cap, copd exacerbation.  Performed by: Mirna Mires Total critical care time: 35 minutes Critical care time was exclusive of separately billable procedures and treating other patients. Critical care was necessary to treat or prevent imminent or life-threatening deterioration. Critical care was time spent personally by me on the following activities: development of treatment plan with patient and/or surrogate as well as nursing, discussions with consultants, evaluation of patient's response to treatment, examination of patient, obtaining history from patient or surrogate, ordering and performing treatments and interventions, ordering and review of laboratory studies, ordering and review of radiographic studies, pulse oximetry and re-evaluation of patient's condition.  Discussed w resident on call for an unassigned admission - requests stepdown bed with attending, Dr Beryle Beams.       Lajean Saver, MD 06/23/15 [phone]

## 2015-06-23 NOTE — Progress Notes (Signed)
Utilization review completed.  

## 2015-06-23 NOTE — Progress Notes (Signed)
ANTIBIOTIC CONSULT NOTE - INITIAL  Pharmacy Consult for levaquin Indication: COPD exacerbation  Allergies  Allergen Reactions  . Novolog [Insulin Aspart] Other (See Comments)    "I'm just highly allergic"  . Tylenol [Acetaminophen] Nausea And Vomiting  . Antihistamines, Chlorpheniramine-Type Other (See Comments)    unknown  . Iodinated Diagnostic Agents Other (See Comments)    Patient states he doesn't know what iodine is and doesn't think he is allergic to it despite it being listed with his allergies  . Lidocaine Itching and Other (See Comments)    Patient is uncertain of this allergy  . Other Hives and Other (See Comments)    Steroid creams  . Pentazocine Lactate Other (See Comments)    unknown  . Sulfa Antibiotics Itching and Other (See Comments)    whelps  . Vicodin [Hydrocodone-Acetaminophen] Palpitations and Other (See Comments)    Doesn't want to take    Patient Measurements: Height: 6\' 9"  (205.7 cm) Weight: 293 lb 14 oz (133.3 kg) IBW/kg (Calculated) : 98.3 Adjusted Body Weight:   Vital Signs: Temp: 100.6 F (38.1 C) (12/27 1250) Temp Source: Oral (12/27 1250) BP: 137/71 mmHg (12/27 1250) Pulse Rate: 147 (12/27 1145) Intake/Output from previous day:   Intake/Output from this shift:    Labs:  Recent Labs  06/23/15 0916  WBC 12.7*  HGB 11.5*  PLT 154  CREATININE 4.14*   Estimated Creatinine Clearance: 30.5 mL/min (by C-G formula based on Cr of 4.14). No results for input(s): VANCOTROUGH, VANCOPEAK, VANCORANDOM, GENTTROUGH, GENTPEAK, GENTRANDOM, TOBRATROUGH, TOBRAPEAK, TOBRARND, AMIKACINPEAK, AMIKACINTROU, AMIKACIN in the last 72 hours.   Microbiology: No results found for this or any previous visit (from the past 720 hour(s)).  Medical History: Past Medical History  Diagnosis Date  . PAF (paroxysmal atrial fibrillation) ([place])   . Diabetes mellitus   . Hypothyroidism   . Hyperlipidemia   . Hypertension   . Chronic pain   . OA (osteoarthritis)    . COPD (chronic obstructive pulmonary disease) ([place])   . RA (rheumatoid arthritis) ([place])   . Hepatitis C   . Venous stasis   . Bipolar 1 disorder ([place])   . Hepatitis C   . Fibromyalgia   . Asthma   . Anxiety   . Melanoma ([place])     face, shoulder arm  . Pancreatitis, acute 2015  . GERD (gastroesophageal reflux disease)   . CKD (chronic kidney disease), stage II     Medications:  Scheduled:  . sodium chloride   Intravenous STAT  . apixaban  5 mg Oral BID  . [START ON 06/24/2015] azithromycin  500 mg Intravenous Q24H  . fluticasone  2 spray Each Nare Daily  . furosemide  80 mg Oral Daily  . guaiFENesin  600 mg Oral BID  . Insulin Glargine  23 Units Subcutaneous Q2200  . ipratropium-albuterol  3 mL Nebulization Q6H  . [START ON 06/24/2015] levothyroxine  88 mcg Oral QAC breakfast  . lisinopril  20 mg Oral Daily  . metoprolol  100 mg Oral q morning - 10a  . metoprolol tartrate  50 mg Oral QPM  . nicotine  21 mg Transdermal Daily  . pantoprazole  40 mg Oral Daily  . [START ON 06/24/2015] predniSONE  40 mg Oral Q breakfast  . simvastatin  20 mg Oral q1800  . sodium chloride  3 mL Intravenous Q12H  . tiotropium  18 mcg Inhalation Daily   Infusions:  . diltiazem (CARDIZEM) infusion     Assessment: 59 yo  who presented with URI. Levaquin has been ordered for him. He was also treated for his hep C. He should be finished with his 3 months now. He has severe renal impairment.    Plan:   Levaquin 750mg  PO q48 hr F/u with renal fxn for further dose adjustment if needed  Onnie Boer, PharmD Pager: [phone] 06/23/2015 1:26 PM

## 2015-06-23 NOTE — Telephone Encounter (Signed)
New message    FYI .Marland Kitchen Patient want Dr. Acie Fredrickson to know that he is admit into hospital at [facility] .

## 2015-06-23 NOTE — Telephone Encounter (Signed)
To Dr. Acie Fredrickson.

## 2015-06-23 NOTE — ED Notes (Addendum)
Pt arrives EMS with c/o fever, shob, coughing up brown stuff since Friday. Pt refused tylenol by EMS. Pt states he is allergic to acetomenophen. But Tylenol is another medication.

## 2015-06-23 NOTE — Progress Notes (Signed)
Patient's blood sugar was in the 400's. MD notified. Will continue to monitor.

## 2015-06-23 NOTE — H&P (Signed)
Date: 06/23/2015               Patient Name:  Johnny Sims MRN: [id number]  DOB: 06/06/1956 Age / Sex: 59 y.o., male   PCP: Lorayne Marek, MD         Medical Service: Internal Medicine Teaching Service         Attending Physician: Dr. Annia Belt, MD    First Contact: Dr. Lovena Le Pager: [phone]  Second Contact: Dr. Marvel Plan Pager: [phone]       After Hours (After 5p/  First Contact Pager: [phone]  weekends / holidays): Second Contact Pager: [phone]   Chief Complaint: cough, congestion  History of Present Illness: Mr. Gagon is a 59 yo male with COPD, DMII, CKD2, and atrial fibrillation, presenting with 4 days h/o worsening muscle aches, chills, cough, and congestion.  Patient reports worsening symptoms since Friday, unrelieved at home.  He reports worsening congestion, "bronchitis," and cough, now productive of brown sputum.  Over the last few days, he has had decreased PO intake 2/2 nausea.  He has not vomited.  He has not been able to take his home medications since Sunday due to feeling sick.  Nothing has particularly worsened causing presentation to ED, but he presented today due to lack of improvement.  He had a flu shot about 2 months ago.  He denies subjective fevers.  He denies sick contacts or hemoptysis.  He denies chronic cough at baseline.  He endorses orthopnea since Friday, but denies recent weight gain, leg swelling, CP, or SOB.  He has no history of MI or CHF.  Echo in May 2016 demonstrated EF 55-60% and mild LVH.  He is prescribed Lasix 80 mg daily for LE edema.  Patient stopped smoking 1 week ago (previously 1 ppd x 45 years).  He occasionally drinks EtOH and denies other drug use.  He denies a family history of CHF or MI.   His brother has COPD.  "Everyone in the family" has HTN.   In the ED, ECG demonstrated afib with RVR with ventricular rate 140-160 bpm,  Patient denies CP, SOB, palpitations, dizziness, or lightheadedness.  He states he can never feel his  afib.  He is currently taking Eliquis for anticoagulation, after suffering GI bleed on Xarelto.  His WBC was elevated to 12.7.  He had a documented fever to 39.4C.  CXR demonstrated interstitial edema and "no appreciable airspace consolidation."  His BNP was 420 (previously 416 in August 2016).  He was started on CTX/Azithromycin.  He was admitted in May 2015 in afib with RVR and osteomyelitis.    Meds: Current Facility-Administered Medications  Medication Dose Route Frequency Provider Last Rate Last Dose  . 0.9 %  sodium chloride infusion   Intravenous STAT Lajean Saver, MD 10 mL/hr at 06/23/15 1334    . albuterol (PROVENTIL) (2.5 MG/3ML) 0.083% nebulizer solution 2.5 mg  2.5 mg Nebulization Q4H PRN Annia Belt, MD      . apixaban Arne Cleveland) tablet 5 mg  5 mg Oral BID Alexa Sherral Hammers, MD      . diazepam (VALIUM) tablet 5 mg  5 mg Oral Q6H PRN Alexa Sherral Hammers, MD      . diltiazem (CARDIZEM) 100 mg in dextrose 5 % 100 mL (1 mg/mL) infusion  5-15 mg/hr Intravenous Continuous Lajean Saver, MD   0 mg/hr at 06/23/15 1154  . fluticasone (FLONASE) 50 MCG/ACT nasal spray 2 spray  2 spray Each Nare Daily Alexa Sherral Hammers,  MD      . guaiFENesin (MUCINEX) 12 hr tablet 600 mg  600 mg Oral BID Alexa Sherral Hammers, MD      . guaiFENesin-dextromethorphan (ROBITUSSIN DM) 100-10 MG/5ML syrup 5 mL  5 mL Oral Q4H PRN Alexa Sherral Hammers, MD      . Insulin Glargine (LANTUS) Solostar Pen 23 Units  23 Units Subcutaneous Q2200 Alexa Sherral Hammers, MD      . ipratropium-albuterol (DUONEB) 0.5-2.5 (3) MG/3ML nebulizer solution 3 mL  3 mL Nebulization Q6H Alexa Sherral Hammers, MD   3 mL at 06/23/15 1317  . [START ON 06/24/2015] levofloxacin (LEVAQUIN) tablet 750 mg  750 mg Oral Q48H Annia Belt, MD      . Derrill Memo ON 06/24/2015] levothyroxine (SYNTHROID, LEVOTHROID) tablet 88 mcg  88 mcg Oral QAC breakfast Alexa Sherral Hammers, MD      . metoprolol (LOPRESSOR) tablet 100 mg  100 mg Oral q morning - 10a  Alexa Sherral Hammers, MD      . metoprolol (LOPRESSOR) tablet 50 mg  50 mg Oral QPM Alexa Sherral Hammers, MD      . nicotine (NICODERM CQ - dosed in mg/24 hours) patch 21 mg  21 mg Transdermal Daily Alexa Sherral Hammers, MD      . ondansetron (ZOFRAN) tablet 4 mg  4 mg Oral Q6H PRN Alexa Sherral Hammers, MD       Or  . ondansetron (ZOFRAN) injection 4 mg  4 mg Intravenous Q6H PRN Alexa Sherral Hammers, MD      . oxyCODONE (Oxy IR/ROXICODONE) immediate release tablet 15 mg  15 mg Oral Q8H PRN Alexa Sherral Hammers, MD      . pantoprazole (PROTONIX) EC tablet 40 mg  40 mg Oral Daily Alexa Sherral Hammers, MD      . Derrill Memo ON 06/24/2015] predniSONE (DELTASONE) tablet 40 mg  40 mg Oral Q breakfast Alexa Sherral Hammers, MD      . simvastatin (ZOCOR) tablet 20 mg  20 mg Oral q1800 Alexa Sherral Hammers, MD      . sodium chloride 0.9 % injection 3 mL  3 mL Intravenous Q12H Alexa Sherral Hammers, MD   0 mL at 06/23/15 1325  . tiotropium (SPIRIVA) inhalation capsule 18 mcg  18 mcg Inhalation Daily Alexa Sherral Hammers, MD        Allergies: Allergies as of 06/23/2015 - Review Complete 06/23/2015  Allergen Reaction Noted  . Novolog [insulin aspart] Other (See Comments) 09/26/2014  . Tylenol [acetaminophen] Nausea And Vomiting 06/23/2015  . Antihistamines, chlorpheniramine-type Other (See Comments) 03/31/2011  . Iodinated diagnostic agents Other (See Comments) 03/31/2011  . Lidocaine Itching and Other (See Comments) 10/14/2013  . Other Hives and Other (See Comments) 10/14/2013  . Pentazocine lactate Other (See Comments) 03/31/2011  . Sulfa antibiotics Itching and Other (See Comments) 07/22/2012  . Vicodin [hydrocodone-acetaminophen] Palpitations and Other (See Comments) 03/31/2011   Past Medical History  Diagnosis Date  . PAF (paroxysmal atrial fibrillation) ([place])   . Diabetes mellitus   . Hypothyroidism   . Hyperlipidemia   . Hypertension   . Chronic pain   . OA (osteoarthritis)   . COPD (chronic obstructive pulmonary  disease) ([place])   . RA (rheumatoid arthritis) ([place])   . Hepatitis C   . Venous stasis   . Bipolar 1 disorder ([place])   . Hepatitis C   . Fibromyalgia   . Asthma   . Anxiety   . Melanoma ([place])     face, shoulder arm  .  Pancreatitis, acute 2015  . GERD (gastroesophageal reflux disease)   . CKD (chronic kidney disease), stage II    Past Surgical History  Procedure Laterality Date  . Thyroidectomy    . Tonsillectomy    . US echocardiography  02/19/2010    EF 55-60%  . Skin cancer excision      melonoma  . Fracture surgery Left     arm  . Submandibular gland excision Left 12/25/2013    w/sialolithotomy  . Inguinal hernia repair Bilateral   . Hernia repair      umbicial  . Knee surgery      /H&P 02/20/2010  . Submandibular gland excision Left 12/25/2013    Procedure: SUBMANDIBULAR GLAND STONE AND CHRONIC SIALOLITHIASIS EXCISION;  Surgeon: Melida Quitter, MD;  Location: [facility];  Service: ENT;  Laterality: Left;  . Amputation Left 11/11/2014    Procedure: FOOT, SECOND [place] AMPUTATION;  Surgeon: Newt Minion, MD;  Location: [facility];  Service: Orthopedics;  Laterality: Left;  . Cardioversion  02/03/2015  . Cardioversion N/A 02/03/2015    Procedure: CARDIOVERSION;  Surgeon: Thayer Headings, MD;  [facility] ENDOSCOPY;  Service: Cardiovascular;  Laterality: N/A;   Family History  Problem Relation Age of Onset  . Hypertension Mother   . Alzheimer's disease Mother   . Heart disease Father    Social History   Social History  . Marital Status: Single    Spouse Name: N/A  . Number of Children: N/A  . Years of Education: N/A   Occupational History  . Not on file.   Social History Main Topics  . Smoking status: Current Every Day Smoker -- 1.00 packs/day for 43 years    Types: Cigarettes  . Smokeless tobacco: Never Used  . Alcohol Use: Yes     Comment: rare  . Drug Use: No  . Sexual Activity: Not on file   Other Topics Concern  . Not on file   Social History Narrative    Review of  Systems: Pertinent items noted in HPI and remainder of comprehensive ROS otherwise negative.  Physical Exam: Blood pressure 137/71, pulse 147, temperature 100.6 F (38.1 C), temperature source Oral, resp. rate 22, height 6\' 9"  (2.057 m), weight 293 lb 14 oz (133.3 kg), SpO2 99 %. Physical Exam  Constitutional: He is oriented to person, place, and time and well-developed, well-nourished, and in no distress.  Obese male, sitting in bed, alert, conversant, and NAD.  HENT:  Head: Normocephalic and atraumatic.  Eyes: EOM are normal. No scleral icterus.  Neck: No JVD present. No tracheal deviation present.  Cardiovascular:  Tachycardic and irregularly irregular rhythm. No murmurs appreciated.  Pulmonary/Chest: Effort normal. No respiratory distress.  Good air movement throughout.  Scattered wheezes appreciated in anterior fields and coarse rhonchi scattered posteriorly.  No crackles.  Abdominal: Soft. He exhibits no distension. There is no tenderness. There is no rebound and no guarding.  Musculoskeletal:  1+ edema to midshin bilaterally.  Neurological: He is alert and oriented to person, place, and time.  Skin: Skin is warm and dry. No rash noted.     Lab results: Basic Metabolic Panel:  Recent Labs  06/23/15 0916  NA 136  K 3.6  CL 104  CO2 20*  GLUCOSE 142*  BUN 36*  CREATININE 4.14*  CALCIUM 8.6*   Liver Function Tests:  Recent Labs  06/23/15 0916  AST 40  ALT 15*  ALKPHOS 46  BILITOT 0.7  PROT 5.9*  ALBUMIN 2.6*  No results for input(s): LIPASE, AMYLASE in the last 72 hours. No results for input(s): AMMONIA in the last 72 hours. CBC:  Recent Labs  06/23/15 0916  WBC 12.7*  NEUTROABS 9.8*  HGB 11.5*  HCT 33.3*  MCV 84.7  PLT 154   Cardiac Enzymes: No results for input(s): CKTOTAL, CKMB, CKMBINDEX, TROPONINI in the last 72 hours. BNP: No results for input(s): PROBNP in the last 72 hours. D-Dimer: No results for input(s): DDIMER in the last 72  hours. CBG:  Recent Labs  06/23/15 1306  GLUCAP 197*   Hemoglobin A1C: No results for input(s): HGBA1C in the last 72 hours. Fasting Lipid Panel: No results for input(s): CHOL, HDL, LDLCALC, TRIG, CHOLHDL, LDLDIRECT in the last 72 hours. Thyroid Function Tests: No results for input(s): TSH, T4TOTAL, FREET4, T3FREE, THYROIDAB in the last 72 hours. Anemia Panel: No results for input(s): VITAMINB12, FOLATE, FERRITIN, TIBC, IRON, RETICCTPCT in the last 72 hours. Coagulation: No results for input(s): LABPROT, INR in the last 72 hours. Urine Drug Screen: Drugs of Abuse     Component Value Date/Time   LABOPIA POSITIVE* 09/24/2014 0144   LABOPIA NEG 09/20/2007 2131   COCAINSCRNUR NONE DETECTED 09/24/2014 0144   COCAINSCRNUR POS* 09/20/2007 2131   LABBENZ NONE DETECTED 09/24/2014 0144   LABBENZ NEG 09/20/2007 2131   AMPHETMU NONE DETECTED 09/24/2014 0144   AMPHETMU NEG 09/20/2007 2131   THCU NONE DETECTED 09/24/2014 0144   LABBARB NONE DETECTED 09/24/2014 0144    Alcohol Level: No results for input(s): ETH in the last 72 hours. Urinalysis:  Recent Labs  06/23/15 1127  COLORURINE YELLOW  LABSPEC 1.023  PHURINE 7.0  GLUCOSEU 100*  HGBUR LARGE*  BILIRUBINUR NEGATIVE  KETONESUR 15*  PROTEINUR >300*  NITRITE NEGATIVE  LEUKOCYTESUR NEGATIVE   Misc. Labs:   Imaging results:  Dg Chest 2 View  06/23/2015  CLINICAL DATA:  Cough and fever for 4 days EXAM: CHEST  2 VIEW COMPARISON:  January 30, 2015 FINDINGS: There is diffuse interstitial edema. There is no appreciable airspace consolidation. Heart is borderline enlarged with slight pulmonary venous hypertension. No adenopathy. No bone lesions. IMPRESSION: Findings most consistent with a degree of congestive heart failure. No airspace consolidation appreciable. No adenopathy apparent. Electronically Signed   By: Lowella Grip III M.D.   On: 06/23/2015 10:08    Other results: EKG: atrial fibrillation, rate 145, prolonged QTc  (503 ms).  Assessment & Plan by Problem: Principal Problem:   COPD exacerbation ([place]) Active Problems:   AKI (acute kidney injury) ([place])   Atrial fibrillation with rapid ventricular response (HCC)   Hyperlipidemia   Essential hypertension   COPD (chronic obstructive pulmonary disease) (HCC)   Tobacco use disorder   GERD (gastroesophageal reflux disease)   Type 2 diabetes mellitus with diabetic polyneuropathy, with long-term current use of insulin (HCC)   CKD (chronic kidney disease)   Thyroid activity decreased  Mr. Termini is a 59 yo male with COPD, DMII, CKD2, and atrial fibrillation, presenting with 4 days h/o worsening muscle aches, chills, cough, and congestion.    COPD Exacerbation: Patient presents with 45 pack-year smoking history and 4 days worsening cough productive of brown sputum.  He has maintained saturations on RA.  CXR does not show focal consolidation c/f PNA, but patient remains febrile with leukocytosis and tachycardia.   Therefore, we will cover patient this IV antibiotics (Levaquin IV tomorrow).  Despite orthopnea, he has no h/o CHF, his BNP is not elevated, and he has had no  recent weight gain.  Patient is on chronic anticoagulation and reports slowly worsening symptoms.  Therefore, PE unlikely.  No PFTs available for staging of his COPD. [ ]  CXR in AM - Levaquin IV tomorrow - Duonebs PRN - Prednisone 40 mg tomorrow - Zofran PRN - Mucinex - Flonase  Atrial Fibrillation with RVR: Unsurprising in a patient with active infection, who has not taken his beta-blocker for the last 3 days.  Patient started on Diltiazem drip in ED for rate control. Will restart home medications and wean Diltiazem drip.  RVR should resolve with treatment of underlying infection. [ ]  TSH - Eliquis - Metoprolol tartrate 100 mg qAM and 50 qPM - Wean Dilt drip  AKI on CKD2: Creatinine 4 on presentation, up from baseline 2.6-3.  Likely pre-renal 2/2 decreased PO intake, with possible  contribution from nephrotoxic Lasix and Lisinopril.  Will hold medications and allow patient to eat and drink and recheck BMP in AM. [ ]  BMP in AM  DMII: Last A1c 6.7% in Sept 2016.  Home regimen Lantus 30 U qHS.  He is "highly allergic" to Novolog.  Therefore, we will not start SSI. - Lantus 23 U qHS  Hypothyroid: Synthroid 88 mcg [ ]  TSH  HLD: Simvastatin 20 mg GERD: Protonix 40 mg  Tobacco: Nicotine 21 mg patch Chronic Pain: Oxycodone 15 mg TID Anxiety: Valium 5 mg q6h PRN FEN/GI: HH/Carb DVT Ppx: Lovenox  Dispo: Disposition is deferred at this time, awaiting improvement of current medical problems. Anticipated discharge in approximately 1-2 day(s).   The patient does have a current PCP (Lorayne Marek, MD) and does need an [facility] hospital follow-up appointment after discharge.  The patient does not have transportation limitations that hinder transportation to clinic appointments.  Signed: Iline Oven, MD, PhD 06/23/2015, 1:36 PM

## 2015-06-24 ENCOUNTER — Inpatient Hospital Stay (HOSPITAL_COMMUNITY): Payer: Medicaid Other

## 2015-06-24 DIAGNOSIS — E1165 Type 2 diabetes mellitus with hyperglycemia: Secondary | ICD-10-CM

## 2015-06-24 DIAGNOSIS — F17200 Nicotine dependence, unspecified, uncomplicated: Secondary | ICD-10-CM

## 2015-06-24 DIAGNOSIS — I10 Essential (primary) hypertension: Secondary | ICD-10-CM

## 2015-06-24 DIAGNOSIS — R509 Fever, unspecified: Secondary | ICD-10-CM | POA: Insufficient documentation

## 2015-06-24 DIAGNOSIS — J449 Chronic obstructive pulmonary disease, unspecified: Secondary | ICD-10-CM

## 2015-06-24 LAB — URINALYSIS, ROUTINE W REFLEX MICROSCOPIC
Bilirubin Urine: NEGATIVE
Glucose, UA: >1000 mg/dL — AB
Ketones, ur: NEGATIVE mg/dL
Leukocytes, UA: NEGATIVE
Nitrite: NEGATIVE
Protein, ur: >300 mg/dL — AB
Specific Gravity, Urine: 1.025 (ref 1.005–1.030)
pH: 5.5 (ref 5.0–8.0)

## 2015-06-24 LAB — COMPREHENSIVE METABOLIC PANEL
ALBUMIN: 2.3 g/dL — AB (ref 3.5–5.0)
ALT: 22 U/L (ref 17–63)
ANION GAP: 12 (ref 5–15)
AST: 70 U/L — ABNORMAL HIGH (ref 15–41)
Alkaline Phosphatase: 47 U/L (ref 38–126)
BILIRUBIN TOTAL: 0.4 mg/dL (ref 0.3–1.2)
BUN: 52 mg/dL — ABNORMAL HIGH (ref 6–20)
CO2: 22 mmol/L (ref 22–32)
Calcium: 8.3 mg/dL — ABNORMAL LOW (ref 8.9–10.3)
Chloride: 101 mmol/L (ref 101–111)
Creatinine, Ser: 4.53 mg/dL — ABNORMAL HIGH (ref 0.61–1.24)
GFR, EST AFRICAN AMERICAN: 15 mL/min — AB (ref >60)
GFR, EST NON AFRICAN AMERICAN: 13 mL/min — AB (ref >60)
GLUCOSE: 379 mg/dL — AB (ref 65–99)
POTASSIUM: 4.2 mmol/L (ref 3.5–5.1)
Sodium: 135 mmol/L (ref 135–145)
TOTAL PROTEIN: 6 g/dL — AB (ref 6.5–8.1)

## 2015-06-24 LAB — GLUCOSE, CAPILLARY
GLUCOSE-CAPILLARY: 314 mg/dL — AB (ref 65–99)
GLUCOSE-CAPILLARY: 463 mg/dL — AB (ref 65–99)
Glucose-Capillary: 296 mg/dL — ABNORMAL HIGH (ref 65–99)
Glucose-Capillary: 261 mg/dL — ABNORMAL HIGH (ref 65–99)
Glucose-Capillary: 278 mg/dL — ABNORMAL HIGH (ref 65–99)

## 2015-06-24 LAB — CBC
HEMATOCRIT: 33.6 % — AB (ref 39.0–52.0)
HEMOGLOBIN: 10.9 g/dL — AB (ref 13.0–17.0)
MCH: 27.9 pg (ref 26.0–34.0)
MCHC: 32.4 g/dL (ref 30.0–36.0)
MCV: 85.9 fL (ref 78.0–100.0)
Platelets: 174 10*3/uL (ref 150–400)
RBC: 3.91 MIL/uL — AB (ref 4.22–5.81)
RDW: 14.5 % (ref 11.5–15.5)
WBC: 13.2 10*3/uL — AB (ref 4.0–10.5)

## 2015-06-24 LAB — INFLUENZA PANEL BY PCR (TYPE A & B)
H1N1 flu by pcr: NOT DETECTED
Influenza A By PCR: NEGATIVE
Influenza B By PCR: NEGATIVE

## 2015-06-24 LAB — URINE MICROSCOPIC-ADD ON

## 2015-06-24 LAB — PROTEIN / CREATININE RATIO, URINE
CREATININE, URINE: 113.56 mg/dL
PROTEIN CREATININE RATIO: 9.86 mg/mg{creat} — AB (ref 0.00–0.15)
TOTAL PROTEIN, URINE: 1120 mg/dL

## 2015-06-24 LAB — HEMOGLOBIN A1C
HEMOGLOBIN A1C: 7 % — AB (ref 4.8–5.6)
Mean Plasma Glucose: 154 mg/dL

## 2015-06-24 MED ORDER — INSULIN GLARGINE 100 UNIT/ML ~~LOC~~ SOLN
30.0000 [IU] | Freq: Every day | SUBCUTANEOUS | Status: DC
  Administered 2015-06-24: 30 [IU] via SUBCUTANEOUS
  Filled 2015-06-24 (×2): qty 0.3

## 2015-06-24 MED ORDER — INSULIN GLARGINE 100 UNIT/ML ~~LOC~~ SOLN
28.0000 [IU] | Freq: Every day | SUBCUTANEOUS | Status: DC
  Filled 2015-06-24: qty 0.28

## 2015-06-24 MED ORDER — INSULIN GLARGINE 100 UNIT/ML ~~LOC~~ SOLN
25.0000 [IU] | Freq: Every day | SUBCUTANEOUS | Status: DC
  Filled 2015-06-24: qty 0.25

## 2015-06-24 MED ORDER — INSULIN REGULAR HUMAN 100 UNIT/ML IJ SOLN
0.0000 [IU] | Freq: Every day | INTRAMUSCULAR | Status: DC
Start: 2015-06-24 — End: 2015-06-25
  Administered 2015-06-24: 5 [IU] via SUBCUTANEOUS
  Administered 2015-06-24: 3 [IU] via SUBCUTANEOUS

## 2015-06-24 MED ORDER — INSULIN REGULAR HUMAN 100 UNIT/ML IJ SOLN
0.0000 [IU] | Freq: Three times a day (TID) | INTRAMUSCULAR | Status: DC
Start: 2015-06-24 — End: 2015-06-25
  Administered 2015-06-24 (×2): 5 [IU] via SUBCUTANEOUS
  Administered 2015-06-24 – 2015-06-25 (×2): 7 [IU] via SUBCUTANEOUS
  Filled 2015-06-24: qty 0.09

## 2015-06-24 MED ORDER — GABAPENTIN 600 MG PO TABS
600.0000 mg | ORAL_TABLET | Freq: Every day | ORAL | Status: DC
  Administered 2015-06-24: 600 mg via ORAL
  Filled 2015-06-24: qty 1

## 2015-06-24 NOTE — Consult Note (Signed)
Johnny Sims Admit Date: 06/23/2015 06/24/2015 Johnny Sims Requesting Physician:  Johnny Beams MD  Reason for Consult:  Progressive CKD HPI:  76M seen at request of Dr. Beryle Sims for the above issue. Patient has been previously seen by [place] in May of this year. During that admission his CKD was evaluated and felt to reflect either chronic hepatitis C or more likely progressive diabetic nephropathy. He has known proteinuria and hematuria. During that admission it appears that he was not able to work with the consulting nephrologist and there was no follow-up arranged. His primary care doctor requested follow-up as an outpatient and he was due to see me in the clinic last month; he was a no-show.  He was admitted yesterday with fever, tachycardia, likely viral URI. He has markedly improved. His pertinent history includes chronic tobacco use but patient states he stopped last week, chronic atrial fibrillation, hypertension, history of hepatitis C and patient tells me he recently completed Harvio therapy.  Has a history of left foot osteomyelitis requiring amputation. Initially the patient has skipped doses and to speak with me and sees little value. After further conversation he is redirected and sees the importance of working with me for his chronic kidney disease. He commits to attending follow-up visits with me.   Chronic nonsteroidal use. He is on an ACE inhibitor. Renal ultrasound during this admission demonstrates normal sized echogenic kidneys without obstruction.  He feels much improved compared to admission yesterday.   CREAT (mg/dL)  Date Value  04/28/2015 2.64*  04/08/2015 3.00*   CREATININE, SER (mg/dL)  Date Value  06/24/2015 4.53*  06/23/2015 4.14*  03/09/2015 2.48*  01/30/2015 2.57*  01/28/2015 2.38*  01/01/2015 2.47*  12/28/2014 2.31*  11/21/2014 3.10*  11/16/2014 3.62*  11/15/2014 3.62*  ] I/Os:  ROS Balance of 12 systems is negative w/ exceptions as  above  PMH  Past Medical History  Diagnosis Date  . PAF (paroxysmal atrial fibrillation) ([place])   . Diabetes mellitus   . Hypothyroidism   . Hyperlipidemia   . Hypertension   . Chronic pain   . OA (osteoarthritis)   . COPD (chronic obstructive pulmonary disease) ([place])   . RA (rheumatoid arthritis) ([place])   . Hepatitis C   . Venous stasis   . Bipolar 1 disorder ([place])   . Hepatitis C   . Fibromyalgia   . Asthma   . Anxiety   . Melanoma ([place])     face, shoulder arm  . Pancreatitis, acute 2015  . GERD (gastroesophageal reflux disease)   . CKD (chronic kidney disease), stage II    PSH  Past Surgical History  Procedure Laterality Date  . Thyroidectomy    . Tonsillectomy    . US echocardiography  02/19/2010    EF 55-60%  . Skin cancer excision      melonoma  . Fracture surgery Left     arm  . Submandibular gland excision Left 12/25/2013    w/sialolithotomy  . Inguinal hernia repair Bilateral   . Hernia repair      umbicial  . Knee surgery      /H&P 02/20/2010  . Submandibular gland excision Left 12/25/2013    Procedure: SUBMANDIBULAR GLAND STONE AND CHRONIC SIALOLITHIASIS EXCISION;  Surgeon: Melida Quitter, MD;  Location: [facility];  Service: ENT;  Laterality: Left;  . Amputation Left 11/11/2014    Procedure: FOOT, SECOND [place] AMPUTATION;  Surgeon: Newt Minion, MD;  Location: [facility];  Service: Orthopedics;  Laterality: Left;  .  Cardioversion  02/03/2015  . Cardioversion N/A 02/03/2015    Procedure: CARDIOVERSION;  Surgeon: Thayer Headings, MD;  Location: [facility] ENDOSCOPY;  Service: Cardiovascular;  Laterality: N/A;   FH  Family History  Problem Relation Age of Onset  . Hypertension Mother   . Alzheimer's disease Mother   . Heart disease Father    [place]  reports that he has been smoking Cigarettes.  He has a 43 pack-year smoking history. He has never used smokeless tobacco. He reports that he drinks alcohol. He reports that he does not use illicit drugs. Allergies  Allergies  Allergen  Reactions  . Novolog [Insulin Aspart] Other (See Comments)    "I'm just highly allergic"  . Hydrocodone-Acetaminophen Palpitations and Other (See Comments)    Doesn't want to take  . Tylenol [Acetaminophen] Nausea And Vomiting  . Antihistamines, Chlorpheniramine-Type Other (See Comments)    unknown  . Iodinated Diagnostic Agents Other (See Comments)    Patient states he doesn't know what iodine is and doesn't think he is allergic to it despite it being listed with his allergies  . Lidocaine Itching and Other (See Comments)    Patient is uncertain of this allergy  . Other Hives and Other (See Comments)    Steroid creams  . Pentazocine Lactate Other (See Comments)    unknown  . Sulfa Antibiotics Itching and Other (See Comments)    whelps  . Vicodin [Hydrocodone-Acetaminophen] Palpitations and Other (See Comments)    Doesn't want to take   Home medications Prior to Admission medications   Medication Sig Start Date End Date Taking? Authorizing Provider  albuterol (PROVENTIL HFA;VENTOLIN HFA) 108 (90 BASE) MCG/ACT inhaler Inhale 2 puffs into the lungs every 4 (four) hours as needed for wheezing or shortness of breath.   Yes Historical Provider, MD  amLODipine (NORVASC) 5 MG tablet Take 5 mg by mouth daily.   Yes Historical Provider, MD  apixaban (ELIQUIS) 5 MG TABS tablet Take 1 tablet (5 mg total) by mouth 2 (two) times daily. 06/04/15  Yes Thayer Headings, MD  cholecalciferol (VITAMIN D) 1000 units tablet Take 1,000 Units by mouth daily.   Yes Historical Provider, MD  diazepam (VALIUM) 10 MG tablet Take 5 mg by mouth every 6 (six) hours as needed for anxiety or sleep. For anxiety   Yes Historical Provider, MD  fluticasone (FLONASE) 50 MCG/ACT nasal spray Place 1 spray into both nostrils daily as needed for allergies.    Yes Historical Provider, MD  furosemide (LASIX) 80 MG tablet Take 1 tablet (80 mg total) by mouth daily. 04/13/15  Yes Thayer Headings, MD  glipiZIDE (GLUCOTROL) 10 MG  tablet Take 10 mg by mouth 2 (two) times daily before a meal.   Yes Historical Provider, MD  Insulin Glargine (LANTUS SOLOSTAR) 100 UNIT/ML Solostar Pen Inject 28 Units into the skin daily at 10 pm. Patient taking differently: Inject 30 Units into the skin daily at 10 pm.  03/06/15  Yes Lance Bosch, NP  levothyroxine (SYNTHROID) 88 MCG tablet Take 1 tablet (88 mcg total) by mouth daily before breakfast. Will send additional refills based off blood level 03/06/15  Yes Lance Bosch, NP  lisinopril (PRINIVIL,ZESTRIL) 20 MG tablet Take 1 tablet (20 mg total) by mouth daily. 03/26/15  Yes Thayer Headings, MD  metoprolol (LOPRESSOR) 50 MG tablet Take 1 tablet (50 mg total) by mouth 3 (three) times daily. 03/26/15  Yes Thayer Headings, MD  Multiple Vitamin (MULTIVITAMIN) tablet Take 1  tablet by mouth every morning.    Yes Historical Provider, MD  nicotine (NICODERM CQ - DOSED IN MG/24 HOURS) 21 mg/24hr patch Place 21 mg onto the skin daily.   Yes Historical Provider, MD  omeprazole (PRILOSEC) 20 MG capsule Take 1 capsule (20 mg total) by mouth daily. Take with Harvoni at the same time under FASTING condition 03/06/15  Yes Lance Bosch, NP  oxycodone (OXY-IR) 5 MG capsule Take 5 mg by mouth every 8 (eight) hours.   Yes Historical Provider, MD  simvastatin (ZOCOR) 20 MG tablet Take 1 tablet (20 mg total) by mouth daily at 6 PM. 03/06/15  Yes Lance Bosch, NP  SPIRIVA HANDIHALER 18 MCG inhalation capsule INHALE CONTENTS OF ONE CAPSULE ONCE A DAY 04/01/15  Yes Thayer Headings, MD    Current Medications Scheduled Meds: . apixaban  5 mg Oral BID  . fluticasone  2 spray Each Nare Daily  . guaiFENesin  600 mg Oral BID  . insulin glargine  28 Units Subcutaneous QHS  . insulin regular  0-5 Units Subcutaneous QHS  . insulin regular  0-9 Units Subcutaneous TID WC  . ipratropium-albuterol  3 mL Nebulization TID  . levothyroxine  88 mcg Oral QAC breakfast  . metoprolol  100 mg Oral q morning - 10a  . metoprolol  tartrate  50 mg Oral QPM  . nicotine  21 mg Transdermal Daily  . pantoprazole  40 mg Oral Daily  . simvastatin  20 mg Oral q1800  . sodium chloride  3 mL Intravenous Q12H   Continuous Infusions:  PRN Meds:.albuterol, diazepam, guaiFENesin-dextromethorphan, ondansetron **OR** ondansetron (ZOFRAN) IV, oxycodone  CBC  Recent Labs Lab 06/23/15 0916 06/24/15 0459  WBC 12.7* 13.2*  NEUTROABS 9.8*  --   HGB 11.5* 10.9*  HCT 33.3* 33.6*  MCV 84.7 85.9  PLT 154 [id number]   Basic Metabolic Panel  Recent Labs Lab 06/23/15 0916 06/24/15 0459  NA 136 135  K 3.6 4.2  CL 104 101  CO2 20* 22  GLUCOSE 142* 379*  BUN 36* 52*  CREATININE 4.14* 4.53*  CALCIUM 8.6* 8.3*    Physical Exam  Blood pressure 138/73, pulse 82, temperature 97.6 F (36.4 C), temperature source Oral, resp. rate 20, height 6\' 9"  (2.057 m), weight 135.172 kg (298 lb), SpO2 99 %. GEN: NAD ENT: NCAT EYES: EOMI CV: Irregular rhythm, normal rate, no murmur PULM: CTAB ABD: obese, protuberant, s/nt SKIN: chronic venous stasis in LEs [id number] - 1+ LEE   Assessment 89M with progressive CKD presumed 2/2 DM2, perhaps HCV related disease, numerous medical comorbidities admitted with viral URI.    1. CKD4 with proteinuria and hematuria, suspect DM2; neg ANCA, ASO, ANA, C3, C4, SPEP/UPEP 10/2014 2. Viral URI 3. HTN 4. Chronic AFib 5. DM2 6. Tobacco Use 7. GERD  Plan 1. Quantify Proteinuria 2. Strongly encouraged pt to connect with me for outpt nehprology care; discussed rationale of pre-emptive CKD management 3. OK for DC once SCr downtrending/stable 4. Cont to hold ACEi with acute changes 5. Daily weights, Daily Renal Panel, Strict I/Os, Avoid nephrotoxins (NSAIDs, judicious IV Contrast)   Pearson Grippe MD [phone] [phone] 06/24/2015, 2:56 PM

## 2015-06-24 NOTE — Discharge Summary (Signed)
Name: Johnny Sims MRN: [id number] DOB: 05-02-1956 59 y.o. PCP: Johnny Marek, MD  Date of Admission: 06/23/2015  8:52 AM Date of Discharge: 06/25/2015 Attending Physician: Johnny Belt, MD  Discharge Diagnosis: 1. Viral upper respiratory infection 2. 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 Atrial fibrillation with rapid ventricular response due to fever 4. Insulin-dependent type 2 diabetes 5. Tobacco use disorder  Discharge Medications:   Medication List    STOP taking these medications        lisinopril 20 MG tablet  Commonly known as:  PRINIVIL,ZESTRIL     nicotine 14 mg/24hr patch  Commonly known as:  NICODERM CQ - dosed in mg/24 hours  Replaced by:  nicotine 21 mg/24hr patch      TAKE these medications        albuterol 108 (90 Base) MCG/ACT inhaler  Commonly known as:  PROVENTIL HFA;VENTOLIN HFA  Inhale 2 puffs into the lungs every 4 (four) hours as needed for wheezing or shortness of breath.     amLODipine 5 MG tablet  Commonly known as:  NORVASC  Take 5 mg by mouth daily.     apixaban 5 MG Tabs tablet  Commonly known as:  ELIQUIS  Take 1 tablet (5 mg total) by mouth 2 (two) times daily.     cholecalciferol 1000 units tablet  Commonly known as:  VITAMIN D  Take 1,000 Units by mouth daily.     diazepam 10 MG tablet  Commonly known as:  VALIUM  Take 5 mg by mouth every 6 (six) hours as needed for anxiety or sleep. For anxiety     fluticasone 50 MCG/ACT nasal spray  Commonly known as:  FLONASE  Place 1 spray into both nostrils daily as needed for allergies.     furosemide 80 MG tablet  Commonly known as:  LASIX  Take 1 tablet (80 mg total) by mouth daily.     glipiZIDE 10 MG tablet  Commonly known as:  GLUCOTROL  Take 10 mg by mouth 2 (two) times daily before a meal.     Insulin Glargine 100 UNIT/ML Solostar Pen  Commonly known as:  LANTUS SOLOSTAR  Inject 30 Units into the skin daily at 10 pm.     levothyroxine 88 MCG tablet   Commonly known as:  SYNTHROID  Take 1 tablet (88 mcg total) by mouth daily before breakfast. Will send additional refills based off blood level     metoprolol 50 MG tablet  Commonly known as:  LOPRESSOR  Take 1 tablet (50 mg total) by mouth 3 (three) times daily.     multivitamin tablet  Take 1 tablet by mouth every morning.     nicotine 21 mg/24hr patch  Commonly known as:  NICODERM CQ - dosed in mg/24 hours  Place 1 patch (21 mg total) onto the skin daily.     omeprazole 20 MG capsule  Commonly known as:  PRILOSEC  Take 1 capsule (20 mg total) by mouth daily. Take with Harvoni at the same time under FASTING condition     oxycodone 5 MG capsule  Commonly known as:  OXY-IR  Take 5 mg by mouth every 8 (eight) hours.     psyllium 95 % Pack  Commonly known as:  HYDROCIL/METAMUCIL  Take 1 packet by mouth daily.     simvastatin 20 MG tablet  Commonly known as:  ZOCOR  Take 1 tablet (20 mg total) by mouth daily at 6 PM.  SPIRIVA HANDIHALER 18 MCG inhalation capsule  Generic drug:  tiotropium  INHALE CONTENTS OF ONE CAPSULE ONCE A DAY        Disposition and follow-up:   Johnny Sims was discharged from [facility] in Good condition.  At the hospital follow up visit please address:  1. Whether he has followed up with the renal doctors to monitor his chronic kidney disease  2. Hypertension as we stopped his lisinopril given progression of his chronic kidney disease  3. Smoking cessation  Consultations:  Treatment Team:  Johnny Agent, MD  Procedures Performed:  X-Johnny Sims Chest Pa And Lateral  06/24/2015  CLINICAL DATA:  Shortness of breath, COPD EXAM: CHEST  2 VIEW COMPARISON:  06/23/2015 FINDINGS: Chronic interstitial markings with lower lobe predominant reticulonodular opacities. No focal consolidation. No interstitial edema. No pleural effusion or pneumothorax. Mild cardiomegaly. Visualized osseous structures are within normal limits. IMPRESSION:  No evidence of acute cardiopulmonary disease. Electronically Signed   By: Johnny Sims M.D.   On: 06/24/2015 07:33   Dg Chest 2 View  06/23/2015  CLINICAL DATA:  Cough and fever for 4 days EXAM: CHEST  2 VIEW COMPARISON:  January 30, 2015 FINDINGS: There is diffuse interstitial edema. There is no appreciable airspace consolidation. Heart is borderline enlarged with slight pulmonary venous hypertension. No adenopathy. No bone lesions. IMPRESSION: Findings most consistent with a degree of congestive heart failure. No airspace consolidation appreciable. No adenopathy apparent. Electronically Signed   By: Johnny Sims III M.D.   On: 06/23/2015 10:08   US Renal  06/24/2015  CLINICAL DATA:  Initial encounter for acute renal insufficiency. EXAM: RENAL / URINARY TRACT ULTRASOUND COMPLETE COMPARISON:  11/12/2014 FINDINGS: Right Kidney: Length: 12.2 cm. Increased echogenicity. No hydronephrosis. No focal mass lesion. Left Kidney: Length: 13.8 cm. Increased echogenicity. No hydronephrosis. No focal mass lesion. Bladder: Appears normal for degree of bladder distention. IMPRESSION: 1. Stable exam. No hydronephrosis. Increased renal echogenicity bilaterally compatible with medical renal disease. Electronically Signed   By: Johnny Sims M.D.   On: 06/24/2015 10:35   Admission HPI: [name] is a 59 yo male with COPD, DMII, CKD2, and atrial fibrillation, presenting with 4 days h/o worsening muscle aches, chills, cough, and congestion. Patient reports worsening symptoms since Friday, unrelieved at home. He reports worsening congestion, "bronchitis," and cough, now productive of brown sputum. Over the last few days, he has had decreased PO intake 2/2 nausea. He has not vomited. He has not been able to take his home medications since Sunday due to feeling sick. Nothing has particularly worsened causing presentation to ED, but he presented today due to lack of improvement. He had a flu shot about 2 months  ago. He denies subjective fevers.  He denies sick contacts or hemoptysis. He denies chronic cough at baseline. He endorses orthopnea since Friday, but denies recent weight gain, leg swelling, CP, or SOB. He has no history of MI or CHF. Echo in May 2016 demonstrated EF 55-60% and mild LVH. He is prescribed Lasix 80 mg daily for LE edema.  Patient stopped smoking 1 week ago (previously 1 ppd x 45 years). He occasionally drinks EtOH and denies other drug use. He denies a family history of CHF or MI. His brother has COPD. "Everyone in the family" has HTN.   In the ED, ECG demonstrated afib with RVR with ventricular rate 140-160 bpm, Patient denies CP, SOB, palpitations, dizziness, or lightheadedness. He states he can never feel his afib. He  is currently taking Eliquis for anticoagulation, after suffering GI bleed on Xarelto.  His WBC was elevated to 12.7. He had a documented fever to 39.4C. CXR demonstrated interstitial edema and "no appreciable airspace consolidation." His BNP was 420 (previously 416 in August 2016). He was started on CTX/Azithromycin.  He was admitted in May 2015 in afib with RVR and osteomyelitis.   Hospital Course by problem list:   1. Viral upper respiratory infection: He presented with congestion, cough, myalgias and fevers; originally there was concern he had pneumonia, however there was no focal consolidation on chest x-Ercole. He got 1 dose of ceftriaxone and azithromycin in the emergency department, and one dose of Levaquin the following day for a presumptive COPD exacerbation. However, he improved considerably, so we did not continue prednisone nor antibiotics for a presumptive COPD exacerbation, and this all appeared to be from a viral URI.   2.  progression of chronic kidney disease to stage IV: His creatinine upon admission was 4.1, above his baseline of 2.5. Renal saw him and believed this was most likely progression of his chronic kidney disease. they  recommended holding his lisinopril but continuing Lasix 80 mg daily. He has scheduled outpatient follow-up with him. Back in May of this year, he was worked up for glomerulonephritis, as he has chronic hematuria and proteinuria, but this was completely negative. He was scheduled for outpatient follow-up at that time, but did not make his appointment. It is vital he follows with the renal doctors going forward.  3. Atrial fibrillation with rapid ventricular response due to fever: On admission, his heart rate was in the 150s; he was started on a diltiazem drip, and his tachycardia resolved once he defervesced. We continued his home dose of metoprolol 100 mg in the morning, and 50 mg in the evening.  4. Insulin-dependent type 2 diabetes: His sugars were elevated in the setting of getting steroids for his presumptive COPD exacerbation. Once we discontinue these, and resume his Lantus 30 units in the evening, his sugars were better-controlled.his A1c this admission was 7.0.   5. Tobacco use disorder: He is currently a 1 pack per day smoker and is interested in quitting. He was sent home with a nicotine patch 21 mg daily.  Discharge Vitals:   BP 136/83 mmHg  Pulse 90  Temp(Src) 97.7 F (36.5 C) (Oral)  Resp 18  Ht 6\' 9"  (2.057 m)  Wt 138.483 kg (305 lb 4.8 oz)  BMI 32.73 kg/m2  SpO2 99%  Discharge Labs:  Results for orders placed or performed during the hospital encounter of 06/23/15 (from the past 24 hour(s))  Glucose, capillary     Status: Abnormal   Collection Time: 06/24/15 11:34 AM  Result Value Ref Range   Glucose-Capillary 261 (H) 65 - 99 mg/dL  Urinalysis, Routine w reflex microscopic (not at [facility])     Status: Abnormal   Collection Time: 06/24/15 12:23 PM  Result Value Ref Range   Color, Urine YELLOW YELLOW   APPearance CLOUDY (A) CLEAR   Specific Gravity, Urine 1.025 1.005 - 1.030   pH 5.5 5.0 - 8.0   Glucose, UA >1000 (A) NEGATIVE mg/dL   Hgb urine dipstick LARGE (A) NEGATIVE    Bilirubin Urine NEGATIVE NEGATIVE   Ketones, ur NEGATIVE NEGATIVE mg/dL   Protein, ur >300 (A) NEGATIVE mg/dL   Nitrite NEGATIVE NEGATIVE   Leukocytes, UA NEGATIVE NEGATIVE  Influenza panel by PCR (type A & B, H1N1)     Status: None  Collection Time: 06/24/15 12:23 PM  Result Value Ref Range   Influenza A By PCR NEGATIVE NEGATIVE   Influenza B By PCR NEGATIVE NEGATIVE   H1N1 flu by pcr NOT DETECTED NOT DETECTED  Urine microscopic-add on     Status: Abnormal   Collection Time: 06/24/15 12:23 PM  Result Value Ref Range   Squamous Epithelial / LPF 0-5 (A) NONE SEEN   WBC, UA 0-5 0 - 5 WBC/hpf   RBC / HPF 6-30 0 - 5 RBC/hpf   Bacteria, UA FEW (A) NONE SEEN   Casts GRANULAR CAST (A) NEGATIVE  Protein / creatinine ratio, urine     Status: Abnormal   Collection Time: 06/24/15 12:23 PM  Result Value Ref Range   Creatinine, Urine 113.56 mg/dL   Total Protein, Urine 1120 mg/dL   Protein Creatinine Ratio 9.86 (H) 0.00 - 0.15 mg/mg[Cre]  Glucose, capillary     Status: Abnormal   Collection Time: 06/24/15  4:24 PM  Result Value Ref Range   Glucose-Capillary 314 (H) 65 - 99 mg/dL  Glucose, capillary     Status: Abnormal   Collection Time: 06/24/15  8:46 PM  Result Value Ref Range   Glucose-Capillary 278 (H) 65 - 99 mg/dL  Glucose, capillary     Status: Abnormal   Collection Time: 06/25/15  7:31 AM  Result Value Ref Range   Glucose-Capillary 309 (H) 65 - 99 mg/dL  Renal function panel     Status: Abnormal   Collection Time: 06/25/15  8:01 AM  Result Value Ref Range   Sodium 131 (L) 135 - 145 mmol/L   Potassium 4.7 3.5 - 5.1 mmol/L   Chloride 100 (L) 101 - 111 mmol/L   CO2 21 (L) 22 - 32 mmol/L   Glucose, Bld 316 (H) 65 - 99 mg/dL   BUN 70 (H) 6 - 20 mg/dL   Creatinine, Ser 4.39 (H) 0.61 - 1.24 mg/dL   Calcium 8.2 (L) 8.9 - 10.3 mg/dL   Phosphorus 5.9 (H) 2.5 - 4.6 mg/dL   Albumin 2.5 (L) 3.5 - 5.0 g/dL   GFR calc non Af Amer 13 (L) >60 mL/min   GFR calc Af Amer 16 (L) >60 mL/min     Anion gap 10 5 - 15    Signed: Loleta Chance, MD 06/25/2015, 10:44 AM

## 2015-06-24 NOTE — Plan of Care (Signed)
Problem: Safety: Goal: Ability to remain free from injury will improve Outcome: Completed/Met Date Met:  06/24/15 Pt up ad lib

## 2015-06-24 NOTE — Progress Notes (Addendum)
Subjective: Johnny Sims said he was feeling great today; he tells me his breathing is much better, and he no longer has fevers or body aches.  Objective: Vital signs in last 24 hours: Filed Vitals:   06/23/15 1700 06/23/15 2008 06/24/15 0000 06/24/15 0443  BP: 131/83 119/67 137/71 119/69  Pulse: 103 79 120 74  Temp: 99.2 F (37.3 C) 98.4 F (36.9 C) 98.1 F (36.7 C) 97.5 F (36.4 C)  TempSrc: Oral Oral Oral Oral  Resp: 21 20 20 20   Height:      Weight:    298 lb (135.172 kg)  SpO2: 98% 97% 96% 95%   Weight change:   Intake/Output Summary (Last 24 hours) at 06/24/15 0703 Last data filed at 06/24/15 [id number]  Gross per 24 hour  Intake  942.3 ml  Output    800 ml  Net  142.3 ml   General: resting in bed comfortably, appropriately conversational HEENT: subtle ulcer in left nare, no scleral icterus, extra-ocular muscles intact, oropharynx without lesions Cardiac: irregular rhythm, no rubs, murmurs or gallops Pulm: breathing well, some bibasilar crackles Abd: bowel sounds normal, soft, nondistended, non-tender Ext: warm and well perfused, 2+ pitting edema to shins with stasis dermatitis Lymph: no cervical or supraclavicular lymphadenopathy Skin: no rash, hair, or nail changes, two scars on back from prior melanoma excision without adjacent hyperpigmentation Neuro: alert and oriented X3, cranial nerves II-XII grossly intact, moving all extremities well  Lab Results: Basic Metabolic Panel:  Recent Labs Lab 06/23/15 0916 06/24/15 0459  NA 136 135  K 3.6 4.2  CL 104 101  CO2 20* 22  GLUCOSE 142* 379*  BUN 36* 52*  CREATININE 4.14* 4.53*  CALCIUM 8.6* 8.3*   Liver Function Tests:  Recent Labs Lab 06/23/15 0916 06/24/15 0459  AST 40 70*  ALT 15* 22  ALKPHOS 46 47  BILITOT 0.7 0.4  PROT 5.9* 6.0*  ALBUMIN 2.6* 2.3*   CBC:  Recent Labs Lab 06/23/15 0916 06/24/15 0459  WBC 12.7* 13.2*  NEUTROABS 9.8*  --   HGB 11.5* 10.9*  HCT 33.3* 33.6*  MCV 84.7 85.9    PLT 154 174   CBG:  Recent Labs Lab 06/23/15 1306 06/23/15 2111 06/23/15 2113 06/23/15 2243 06/24/15 0002  GLUCAP 197* 443* 484* 454* 463*   Hemoglobin A1C:  Recent Labs Lab 06/23/15 1339  HGBA1C 7.0*   Thyroid Function Tests:  Recent Labs Lab 06/23/15 1339  TSH 0.666   Urinalysis:  Recent Labs Lab 06/23/15 1127  COLORURINE YELLOW  LABSPEC 1.023  PHURINE 7.0  GLUCOSEU 100*  HGBUR LARGE*  BILIRUBINUR NEGATIVE  KETONESUR 15*  PROTEINUR >300*  NITRITE NEGATIVE  LEUKOCYTESUR NEGATIVE   Studies/Results: Dg Chest 2 View  06/23/2015  CLINICAL DATA:  Cough and fever for 4 days EXAM: CHEST  2 VIEW COMPARISON:  January 30, 2015 FINDINGS: There is diffuse interstitial edema. There is no appreciable airspace consolidation. Heart is borderline enlarged with slight pulmonary venous hypertension. No adenopathy. No bone lesions. IMPRESSION: Findings most consistent with a degree of congestive heart failure. No airspace consolidation appreciable. No adenopathy apparent. Electronically Signed   By: Lowella Grip III M.D.   On: 06/23/2015 10:08   Medications: I have reviewed the patient's current medications. Scheduled Meds: . apixaban  5 mg Oral BID  . fluticasone  2 spray Each Nare Daily  . guaiFENesin  600 mg Oral BID  . insulin glargine  28 Units Subcutaneous QHS  . insulin regular  0-5 Units  Subcutaneous QHS  . insulin regular  0-9 Units Subcutaneous TID WC  . ipratropium-albuterol  3 mL Nebulization TID  . levothyroxine  88 mcg Oral QAC breakfast  . metoprolol  100 mg Oral q morning - 10a  . metoprolol tartrate  50 mg Oral QPM  . nicotine  21 mg Transdermal Daily  . pantoprazole  40 mg Oral Daily  . simvastatin  20 mg Oral q1800  . sodium chloride  3 mL Intravenous Q12H   PRN Meds:.albuterol, diazepam, guaiFENesin-dextromethorphan, ondansetron **OR** ondansetron (ZOFRAN) IV, oxycodone   Assessment/Plan:  Viral upper respiratory infection: His congestion,  myalgias, and cough sound most consistent with a viral URI. This has resolved overnight, and he is no longer febrile nor dyspneic. I don't think this is a true COPD exacerbation, so we stopped prednisone. We will give him his dose of levofloxacin today, and stop it after that. -Discontinued prednisone -Discontinued levofloxacin  Acute kidney injury: His creatinine is up from 4.1-4.5 today despite getting fluids and controlling his rate. He also has proteinuria, hematuria, and hypoalbuminemia. so I'm concerned this could be glomerulonephritis. Granulomatosis with polyangitis is one consideration, given his nasal crusting and sinusitis, but he does not have any focal consolidations or hilar adenopathy on his chest x-Johnny Sims. He does have lower extremity purpura, but I believe this is stasis dermatitis. Mixed cryoglobulinemia was another consideration, but he was treated for hepatitis C in the past, and this was undetectable in September 2016. IgA nephropathy is another consideration, given his recent viral URI. Will consult renal today. We have also reordered another urinalysis and a renal ultrasound. -Consulted renal, and we appreciate your help in the work-up -Re-ordered urinalysis -Ordered renal ultrasound  Atrial fibrillation with rapid ventricular response: His rates are much better controlled, now in the 70s since his fever has resolved. I've stopped the diltiazem drip, and we'll continue him on his home dose of metoprolol. -Stopped diltiazem drip -Continue metoprolol 100 mg in the morning, 50 mg in the evening  Type 2 diabetes: His sugars have been in the 400s since getting steroids. We'll resume his home dose of Lantus 28 units at night, with Novolin sliding scale. We stopped the steroids, so I hope this will help [place] get better control. -Increased Lantus from 20-28 units at night -Continue Novolin sliding scale -Stopped steroids  Essential hypertension: His pressures have been well controlled on  his home regimen of metoprolol. -Continue metoprolol per above  Hypothyroidism: His TSH was normal at 0.666. -Continue Synthroid 88 g daily  Tobacco use disorder: He is a one pack per day smoker. He is doing well with a nicotine patch thus far -Continue nicotine patch 21 mg daily  Dispo: Disposition is deferred at this time, awaiting improvement of current medical problems.  Anticipated discharge in approximately 1-2 day(s).   The patient does have a current PCP (Lorayne Marek, MD) and does need an [facility] hospital follow-up appointment after discharge.  The patient does have transportation limitations that hinder transportation to clinic appointments.  .Services Needed at time of discharge: Y = Yes, Blank = No PT:   OT:   RN:   Equipment:   Other:     LOS: 1 day   Loleta Chance, MD 06/24/2015, 7:03 AM

## 2015-06-25 DIAGNOSIS — E1122 Type 2 diabetes mellitus with diabetic chronic kidney disease: Secondary | ICD-10-CM

## 2015-06-25 DIAGNOSIS — N184 Chronic kidney disease, stage 4 (severe): Secondary | ICD-10-CM

## 2015-06-25 LAB — RENAL FUNCTION PANEL
ALBUMIN: 2.5 g/dL — AB (ref 3.5–5.0)
Anion gap: 10 (ref 5–15)
BUN: 70 mg/dL — AB (ref 6–20)
CALCIUM: 8.2 mg/dL — AB (ref 8.9–10.3)
CHLORIDE: 100 mmol/L — AB (ref 101–111)
CO2: 21 mmol/L — ABNORMAL LOW (ref 22–32)
CREATININE: 4.39 mg/dL — AB (ref 0.61–1.24)
GFR, EST AFRICAN AMERICAN: 16 mL/min — AB (ref >60)
GFR, EST NON AFRICAN AMERICAN: 13 mL/min — AB (ref >60)
Glucose, Bld: 316 mg/dL — ABNORMAL HIGH (ref 65–99)
PHOSPHORUS: 5.9 mg/dL — AB (ref 2.5–4.6)
Potassium: 4.7 mmol/L (ref 3.5–5.1)
Sodium: 131 mmol/L — ABNORMAL LOW (ref 135–145)

## 2015-06-25 LAB — GLUCOSE, CAPILLARY: GLUCOSE-CAPILLARY: 309 mg/dL — AB (ref 65–99)

## 2015-06-25 MED ORDER — INSULIN GLARGINE 100 UNIT/ML SOLOSTAR PEN: 30.0000 [IU] | PEN_INJECTOR | Freq: Every day | SUBCUTANEOUS | Status: AC

## 2015-06-25 MED ORDER — PSYLLIUM 95 % PO PACK: 1.0000 | PACK | Freq: Every day | ORAL | Status: AC

## 2015-06-25 MED ORDER — ALBUTEROL SULFATE (2.5 MG/3ML) 0.083% IN NEBU: 2.5000 mg | INHALATION_SOLUTION | RESPIRATORY_TRACT | Status: DC | PRN

## 2015-06-25 MED ORDER — TIOTROPIUM BROMIDE MONOHYDRATE 18 MCG IN CAPS
18.0000 ug | ORAL_CAPSULE | Freq: Every day | RESPIRATORY_TRACT | Status: DC
  Filled 2015-06-25: qty 5

## 2015-06-25 MED ORDER — NICOTINE 21 MG/24HR TD PT24: 21.0000 mg | MEDICATED_PATCH | Freq: Every day | TRANSDERMAL | Status: DC

## 2015-06-25 MED ORDER — PSYLLIUM 95 % PO PACK
1.0000 | PACK | Freq: Every day | ORAL | Status: DC
  Administered 2015-06-25: 1 via ORAL
  Filled 2015-06-25: qty 1

## 2015-06-25 NOTE — Progress Notes (Signed)
Inpatient Diabetes Program Recommendations  AACE/ADA: New Consensus Statement on Inpatient Glycemic Control (2015)  Target Ranges:  Prepandial:   less than 140 mg/dL      Peak postprandial:   less than 180 mg/dL (1-2 hours)      Critically ill patients:  140 - 180 mg/dL   Results for Johnny Sims, Johnny Sims (MRN [id number]) as of 06/25/2015 09:21  Ref. Range 06/24/2015 07:37 06/24/2015 11:34 06/24/2015 16:24 06/24/2015 20:46 06/25/2015 07:31  Glucose-Capillary Latest Ref Range: 65-99 mg/dL 296 (H) 261 (H) 314 (H) 278 (H) 309 (H)   Review of Glycemic Control  Diabetes history: DM 2 Outpatient Diabetes medications: Lantus 30 units, Glipizide 10 mg BID Current orders for Inpatient glycemic control: Lantus 30, Regular insulin Sensitive scale TID + HS scale  Inpatient Diabetes Program Recommendations: Correction (SSI): Consider increasing Regular insulin correction to Resistant scale.   May need to also increase basal insulin to Lantus 35 units since fasting glucose is over 300 mg/dl. May have to decrease dose as glucose decreases after steroid use.  Thanks,  Tama Headings RN, MSN, [facility] Inpatient Diabetes Coordinator Team Pager [phone] (8a-5p)

## 2015-06-25 NOTE — Hospital Discharge Follow-Up (Signed)
[facility] [place]:  This Case Manager case conferenced with Jacqlyn Krauss, RN CM regarding patient as it was determined patient may benefit from follow-up with the [facility] after discharge. Met with patient at bedside to provide information about the [facility]; however, patient indicated he has a new PCP, Dr. Creig Hines, and plans to follow-up with him after discharge.  Patient not interested in follow-up with the [facility].  Jacqlyn Krauss, RN CM updated.

## 2015-06-25 NOTE — Progress Notes (Signed)
Subjective: [name] tells me he's feeling great this morning. His fevers have gone away completely and his breathing is back to normal. Yesterday, he had a nice conversation with the [facility] doctors, and is scheduled to follow-up with them on an outpatient basis.  Objective: Vital signs in last 24 hours: Filed Vitals:   06/24/15 1314 06/24/15 1708 06/24/15 2025 06/25/15 0514  BP: 138/73 140/80 152/89 136/83  Pulse: 82 87 81 90  Temp: 97.6 F (36.4 C) 98 F (36.7 C) 97.7 F (36.5 C) 97.7 F (36.5 C)  TempSrc: Oral Oral Oral Oral  Resp:   18 18  Height:      Weight:    305 lb 4.8 oz (138.483 kg)  SpO2: 99% 97% 95% 99%   Weight change: 3 lb 4.8 oz (1.497 kg)  Intake/Output Summary (Last 24 hours) at 06/25/15 0727 Last data filed at 06/25/15 0515  Gross per 24 hour  Intake   1320 ml  Output   2105 ml  Net   -785 ml   General: resting in bed comfortably, appropriately conversational, joking around HEENT: no scleral icterus, extra-ocular muscles intact, oropharynx without lesions Cardiac: irregular rhythm, no rubs, murmurs or gallops Pulm: breathing well, some end-expiratory ronchi, unchanged from yesterday Abd: bowel sounds normal, soft, nondistended, non-tender Ext: warm and well perfused, with 2+ pitting edema in lower extremities, unchanged from yesterday Lymph: no cervical or supraclavicular lymphadenopathy Neuro: alert and oriented X3, cranial nerves II-XII grossly intact, moving all extremities well  Lab Results: Basic Metabolic Panel:  Recent Labs Lab 06/24/15 0459 06/25/15 0801  NA 135 131*  K 4.2 4.7  CL 101 100*  CO2 22 21*  GLUCOSE 379* 316*  BUN 52* 70*  CREATININE 4.53* 4.39*  CALCIUM 8.3* 8.2*  PHOS  --  5.9*   CBG:  Recent Labs Lab 06/24/15 0002 06/24/15 0737 06/24/15 1134 06/24/15 1624 06/24/15 2046 06/25/15 0731  GLUCAP 463* 296* 261* 314* 278* 309*   Studies/Results: US Renal  06/24/2015  CLINICAL DATA:  Initial encounter for  acute renal insufficiency. EXAM: RENAL / URINARY TRACT ULTRASOUND COMPLETE COMPARISON:  11/12/2014 FINDINGS: Right Kidney: Length: 12.2 cm. Increased echogenicity. No hydronephrosis. No focal mass lesion. Left Kidney: Length: 13.8 cm. Increased echogenicity. No hydronephrosis. No focal mass lesion. Bladder: Appears normal for degree of bladder distention. IMPRESSION: 1. Stable exam. No hydronephrosis. Increased renal echogenicity bilaterally compatible with medical renal disease. Electronically Signed   By: Misty Stanley M.D.   On: 06/24/2015 10:35   Medications: I have reviewed the patient's current medications. Scheduled Meds: . apixaban  5 mg Oral BID  . fluticasone  2 spray Each Nare Daily  . gabapentin  600 mg Oral QHS  . guaiFENesin  600 mg Oral BID  . insulin glargine  30 Units Subcutaneous QHS  . insulin regular  0-5 Units Subcutaneous QHS  . insulin regular  0-9 Units Subcutaneous TID WC  . levothyroxine  88 mcg Oral QAC breakfast  . metoprolol  100 mg Oral q morning - 10a  . metoprolol tartrate  50 mg Oral QPM  . nicotine  21 mg Transdermal Daily  . pantoprazole  40 mg Oral Daily  . psyllium  1 packet Oral Daily  . simvastatin  20 mg Oral q1800  . sodium chloride  3 mL Intravenous Q12H  . tiotropium  18 mcg Inhalation Daily   Continuous Infusions:  PRN Meds:.albuterol, diazepam, guaiFENesin-dextromethorphan, ondansetron **OR** ondansetron (ZOFRAN) IV, oxycodone  Assessment/Plan:  Viral upper respiratory  infection: His upper respiratory symptoms have completely resolved. We held his antibiotics today.  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 He had a full work-up for glomerulonephritis back in May that was normal, and his renal ultrasound yesterday did not show an obstruction. I'm glad we placed the consult because he had a nice discussion  with the renal team and is planning on seeing them on an outpatient basis. We'll hold his ACEi and resume his lasix upon discharge per Renal's recommendations. -Continue lasix 80mg  daily upon discharge -Hold ACEi upon discharge  Atrial fibrillation with rapid ventricular response: His rates are much better controlled, now in the 90s since his fever has resolved. -Continue metoprolol 100 mg in the morning, 50 mg in the evening  Type 2 diabetes: His sugars have been in the 200-300s overnight on lantus 28U at night.  -Continue Lantus 28 units at night -Continue Novolin sliding scale  Essential hypertension: His pressures have been well controlled on his home regimen of metoprolol. -Continue metoprolol per above  Hypothyroidism: His TSH was normal at 0.666. -Continue Synthroid 88 g daily  Tobacco use disorder: He is a one pack per day smoker. He is doing well with a nicotine patch thus far -Continue nicotine patch 21 mg daily -Prescribe gum upon discharge  Dispo: Disposition is deferred at this time, awaiting improvement of current medical problems.  Anticipated discharge in approximately 0-1 day(s).   The patient does have a current PCP (Johnny Marek, MD) and does need an [facility] hospital follow-up appointment after discharge.  The patient does not know have transportation limitations that hinder transportation to clinic appointments.  .Services Needed at time of discharge: Y = Yes, Blank = No PT:   OT:   RN:   Equipment:   Other:     LOS: 2 days   Loleta Chance, MD 06/25/2015, 7:27 AM

## 2015-06-25 NOTE — Progress Notes (Signed)
Pt being discharged home. PT removed his own IV. Pt states he is going to take care of his transportation. RN offered help. PT refused.

## 2015-06-25 NOTE — Discharge Instructions (Signed)
Acute Kidney Injury °Acute kidney injury is any condition in which there is sudden (acute) damage to the kidneys. Acute kidney injury was previously known as acute kidney failure or acute renal failure. The kidneys are two organs that lie on either side of the spine between the middle of the back and the front of the abdomen. The kidneys: °· Remove wastes and extra water from the blood.   °· Produce important hormones. These help keep bones strong, regulate blood pressure, and help create red blood cells.   °· Balance the fluids and chemicals in the blood and tissues. °A small amount of kidney damage may not cause problems, but a large amount of damage may make it difficult or impossible for the kidneys to work the way they should. Acute kidney injury may develop into long-lasting (chronic) kidney disease. It may also develop into a life-threatening disease called end-stage kidney disease. Acute kidney injury can get worse very quickly, so it should be treated right away. Early treatment may prevent other kidney diseases from developing. °CAUSES  °· A problem with blood flow to the kidneys. This may be caused by:   °¨ Blood loss.   °¨ Heart disease.   °¨ Severe burns.   °¨ Liver disease. °· Direct damage to the kidneys. This may be caused by: °¨ Some medicines.   °¨ A kidney infection.   °¨ Poisoning or consuming toxic substances.   °¨ A surgical wound.   °¨ A blow to the kidney area.   °· A problem with urine flow. This may be caused by:   °¨ Cancer.   °¨ Kidney stones.   °¨ An enlarged prostate. °SIGNS AND SYMPTOMS  °· Swelling (edema) of the legs, ankles, or feet.   °· Tiredness (lethargy).   °· Nausea or vomiting.   °· Confusion.   °· Problems with urination, such as:   °¨ Painful or burning feeling during urination.   °¨ Decreased urine production.   °¨ Frequent accidents in children who are potty trained.   °¨ Bloody urine.   °· Muscle twitches and cramps.   °· Shortness of breath.   °· Seizures.   °· Chest  pain or pressure. °Sometimes, no symptoms are present.  °DIAGNOSIS °Acute kidney injury may be detected and diagnosed by tests, including blood, urine, imaging, or kidney biopsy tests.  °TREATMENT °Treatment of acute kidney injury varies depending on the cause and severity of the kidney damage. In mild cases, no treatment may be needed. The kidneys may heal on their own. If acute kidney injury is more severe, your health care provider will treat the cause of the kidney damage, help the kidneys heal, and prevent complications from occurring. Severe cases may require a procedure to remove toxic wastes from the body (dialysis) or surgery to repair kidney damage. Surgery may involve:  °· Repair of a torn kidney.   °· Removal of an obstruction. °HOME CARE INSTRUCTIONS °· Follow your prescribed diet. °· Take medicines only as directed by your health care provider.  °· Do not take any new medicines (prescription, over-the-counter, or nutritional supplements) unless approved by your health care provider. Many medicines can worsen your kidney damage or may need to have the dose adjusted.   °· Keep all follow-up visits as directed by your health care provider. This is important. °· Observe your condition to make sure you are healing as expected. °SEEK IMMEDIATE MEDICAL CARE IF: °· You are feeling ill or have severe pain in the back or side.   °· Your symptoms return or you have new symptoms. °· You have any symptoms of end-stage kidney disease. These include:   °¨ Persistent itchiness.   °¨   Loss of appetite.   °¨ Headaches.   °¨ Abnormally dark or light skin. °¨ Numbness in the hands or feet.   °¨ Easy bruising.   °¨ Frequent hiccups.   °¨ Menstruation stops.   °· You have a fever. °· You have increased urine production. °· You have pain or bleeding when urinating. °MAKE SURE YOU:  °· Understand these instructions. °· Will watch your condition. °· Will get help right away if you are not doing well or get worse. °  °This  information is not intended to replace advice given to you by your health care provider. Make sure you discuss any questions you have with your health care provider. °  °Document Released: 12/27/2010 Document Revised: 07/04/2014 Document Reviewed: 02/10/2012 °Elsevier Interactive Patient Education ©2016 [employer] ° °

## 2015-06-25 NOTE — Progress Notes (Signed)
Admit: 06/23/2015 LOS: 2  38M with progressive CKD presumed 2/2 DM2, perhaps HCV related disease, numerous medical comorbidities admitted with viral URI.  Subjective:  No new events.  Feels well.  Ready to leave.  Breathing ok Cont to state h will come see me in office  UP/C 9.86  12/28 0701 - 12/29 0700 In: 1320 [P.O.:1320] Out: 2105 [Urine:2105]  Filed Weights   06/23/15 1250 06/24/15 0443 06/25/15 0514  Weight: 133.3 kg (293 lb 14 oz) 135.172 kg (298 lb) 138.483 kg (305 lb 4.8 oz)    Scheduled Meds: . apixaban  5 mg Oral BID  . fluticasone  2 spray Each Nare Daily  . gabapentin  600 mg Oral QHS  . guaiFENesin  600 mg Oral BID  . insulin glargine  30 Units Subcutaneous QHS  . insulin regular  0-5 Units Subcutaneous QHS  . insulin regular  0-9 Units Subcutaneous TID WC  . levothyroxine  88 mcg Oral QAC breakfast  . metoprolol  100 mg Oral q morning - 10a  . metoprolol tartrate  50 mg Oral QPM  . nicotine  21 mg Transdermal Daily  . pantoprazole  40 mg Oral Daily  . psyllium  1 packet Oral Daily  . simvastatin  20 mg Oral q1800  . sodium chloride  3 mL Intravenous Q12H  . tiotropium  18 mcg Inhalation Daily   Continuous Infusions:  PRN Meds:.albuterol, diazepam, guaiFENesin-dextromethorphan, ondansetron **OR** ondansetron (ZOFRAN) IV, oxycodone  Current Labs: reviewed    Physical Exam:  Blood pressure 136/83, pulse 90, temperature 97.7 F (36.5 C), temperature source Oral, resp. rate 18, height 6\' 9"  (2.057 m), weight 138.483 kg (305 lb 4.8 oz), SpO2 99 %. GEN: NAD ENT: NCAT EYES: EOMI CV: Irregular rhythm, normal rate, no murmur PULM: CTAB ABD: obese, protuberant, s/nt SKIN: chronic venous stasis in LEs [id number] - 1+ LEE  A 1. CKD4 with nephrotic proteinuria and hematuria, suspect DM2; neg ANCA, ASO, ANA, C3, C4, SPEP/UPEP 10/2014 2. Viral URI 3. HTN 4. Chronic AFib 5. DM2 6. Tobacco Use 7. GERD 8. Azotemia partialy 2/2 corticosteroids, now  stopped  P 1. Cont diuretics 2. Appt made with me in outpt setting with labs 3. OK for DC today from my renal viewpoint 4. Cont to hold ACEi with acute changes 5. Daily weights, Daily Renal Panel, Strict I/Os, Avoid nephrotoxins (NSAIDs, judicious IV Contrast)  Will sign off for now, Please call with any questions  Pearson Grippe MD 06/25/2015, 10:37 AM   Recent Labs Lab 06/23/15 0916 06/24/15 0459 06/25/15 0801  NA 136 135 131*  K 3.6 4.2 4.7  CL 104 101 100*  CO2 20* 22 21*  GLUCOSE 142* 379* 316*  BUN 36* 52* 70*  CREATININE 4.14* 4.53* 4.39*  CALCIUM 8.6* 8.3* 8.2*  PHOS  --   --  5.9*    Recent Labs Lab 06/23/15 0916 06/24/15 0459  WBC 12.7* 13.2*  NEUTROABS 9.8*  --   HGB 11.5* 10.9*  HCT 33.3* 33.6*  MCV 84.7 85.9  PLT 154 174

## 2015-06-28 LAB — CULTURE, BLOOD (ROUTINE X 2)
CULTURE: NO GROWTH
Culture: NO GROWTH

## 2015-07-02 IMAGING — DX DG FOOT COMPLETE 3+V*L*
3 series · 3 of 3 positions shown · non-contrast
Comparison: None.

CLINICAL DATA: LEFT leg edema and swelling. Second toe swelling and
color changes.

EXAM:
LEFT FOOT - COMPLETE 3+ VIEW

[foot ap]
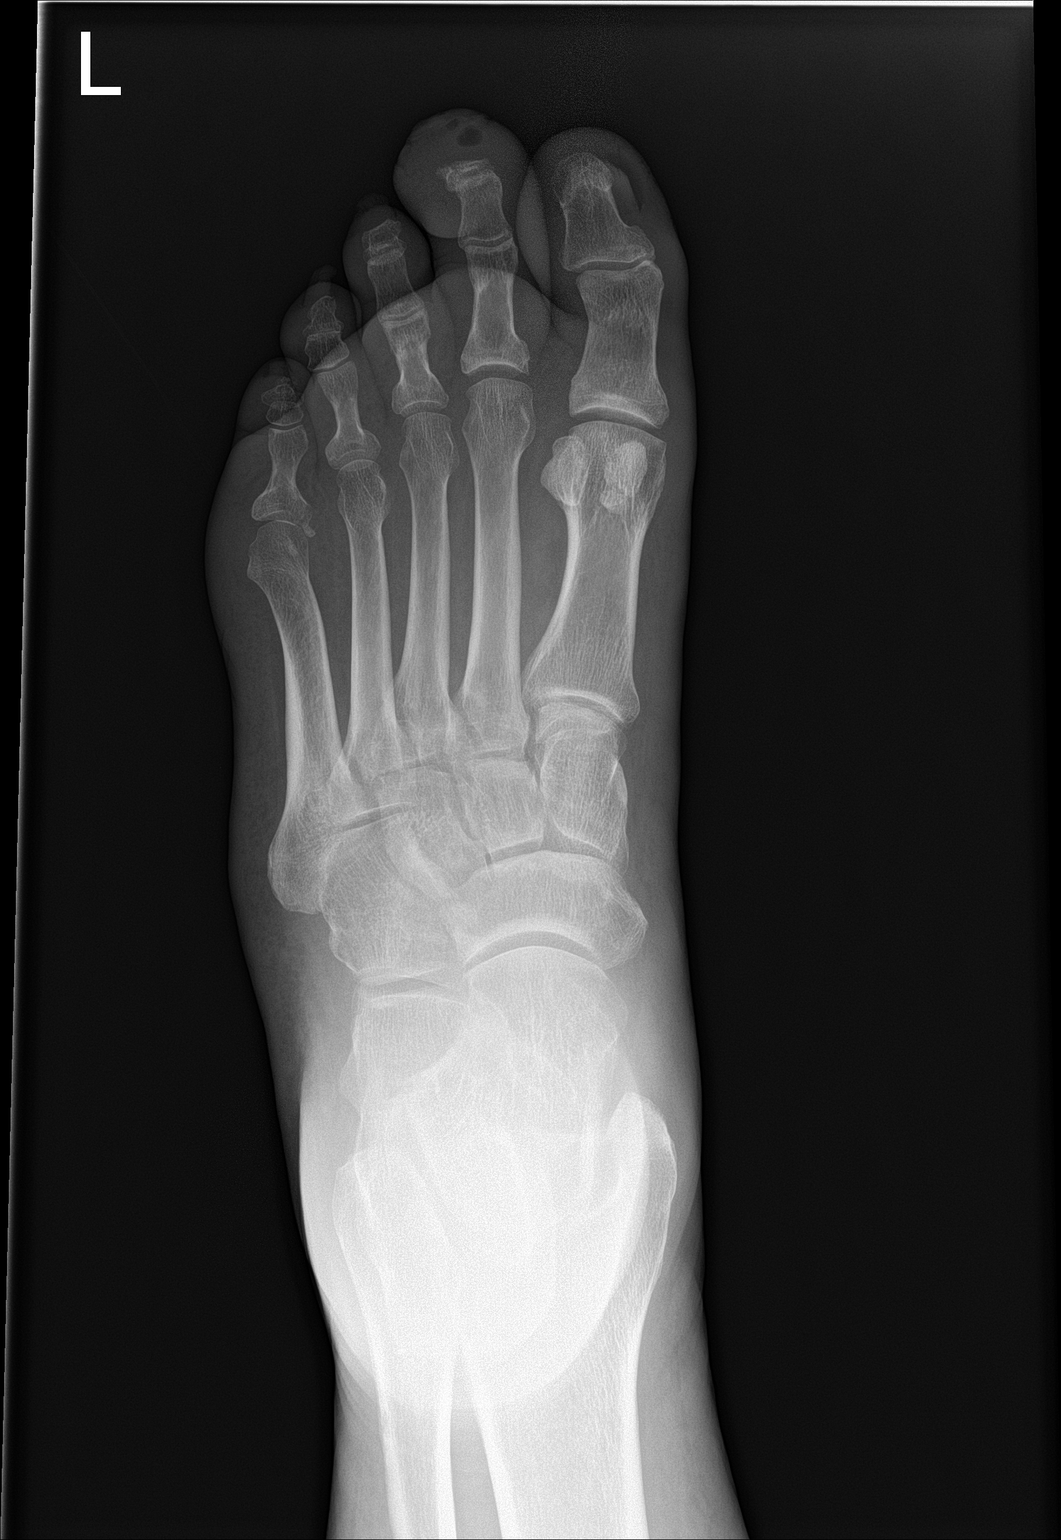

[foot obl]
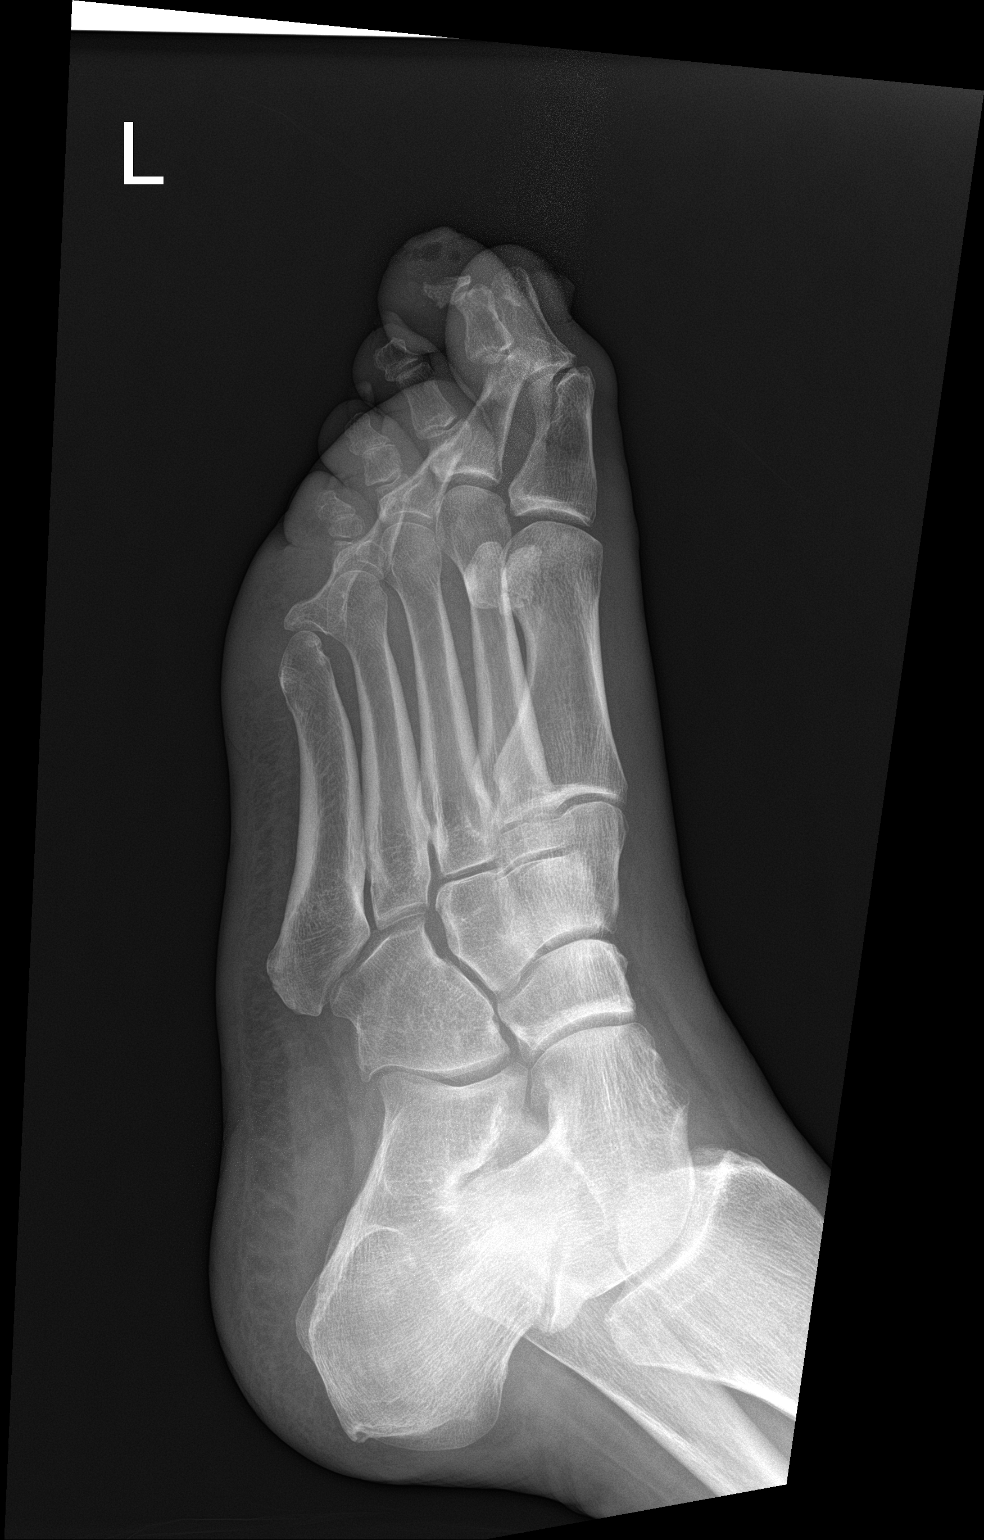

[foot lat]
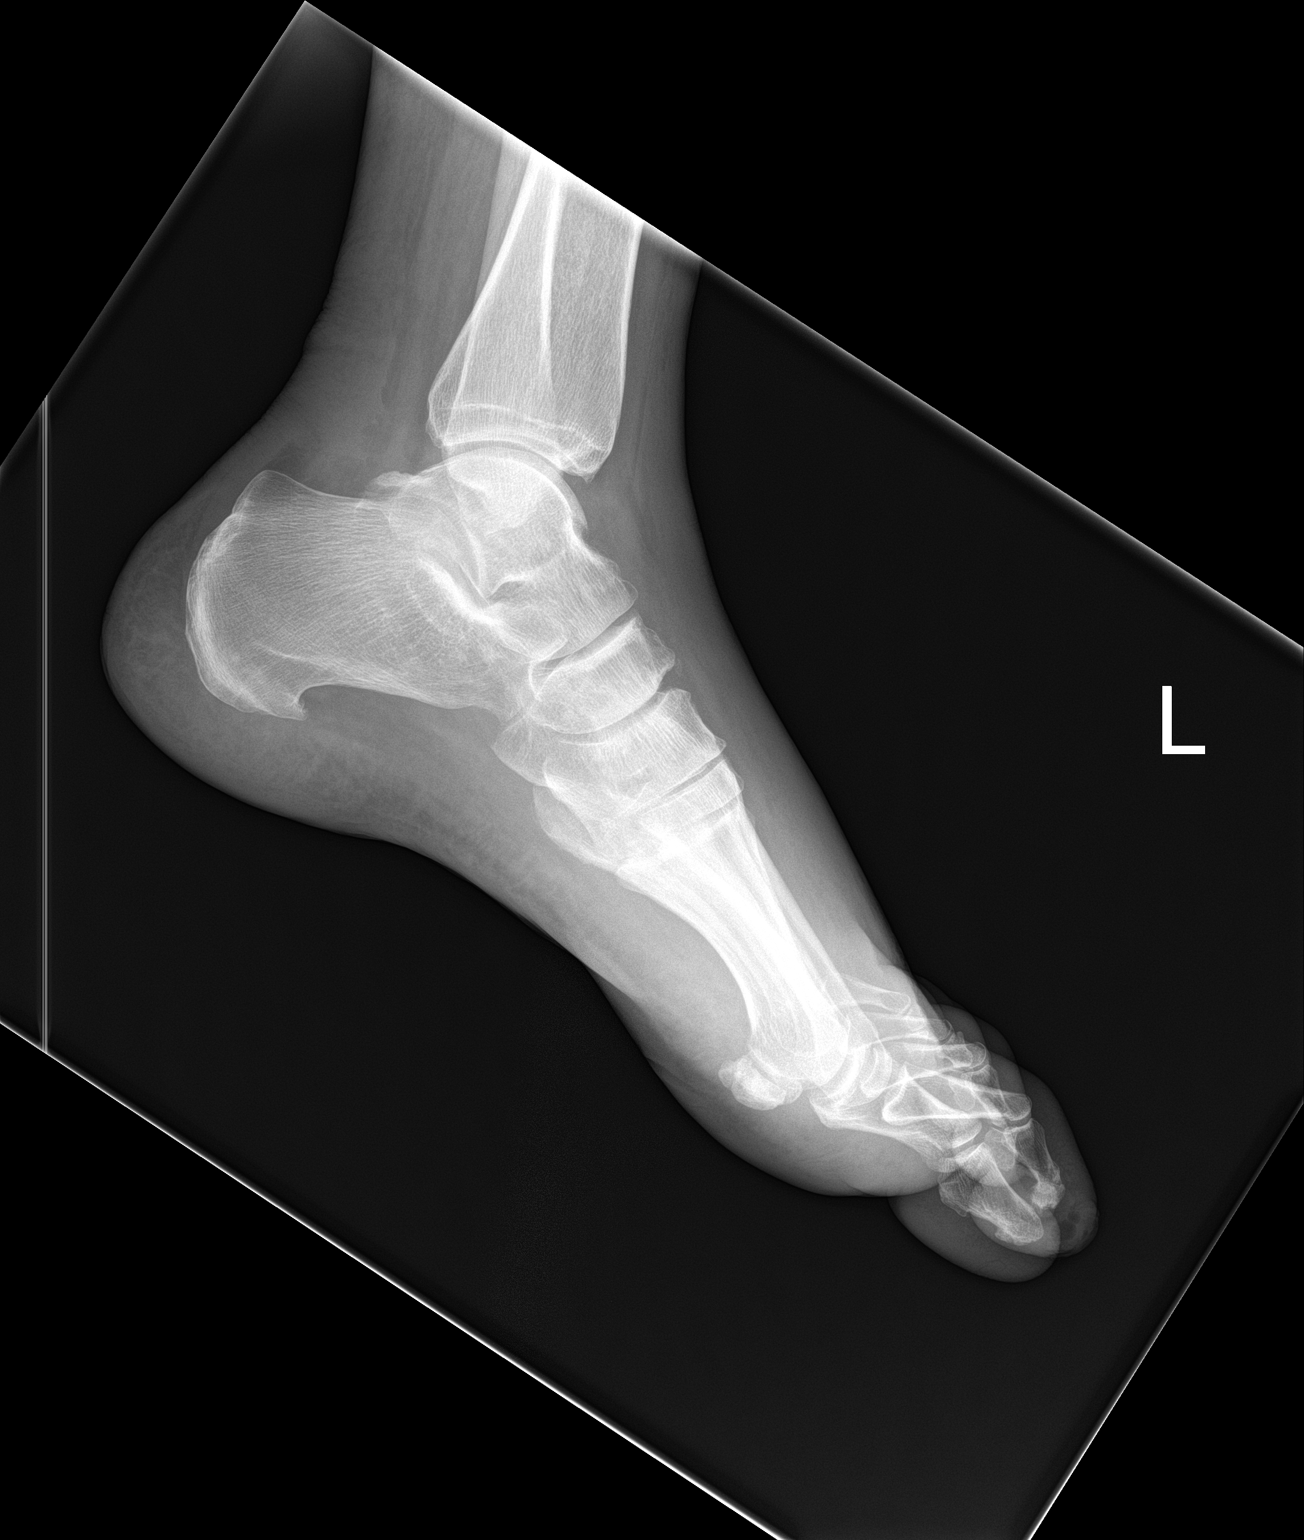

[3 of 3 positions shown; findings below may reference images not displayed]

FINDINGS: There is diffuse soft tissue swelling around the second toe. The toe
is flexed and the terminal phalanx is suboptimally visualized
however there appears to be osteolysis of the terminal phalanx. The
terminal phalanx is small and the appearance is compatible with
osteomyelitis, particularly with gas in the adjacent soft tissues.
The other toes appear within normal limits. There is no proximal
tracking of gas in the foot.
IMPRESSION: Second toe osteomyelitis with adjacent soft tissue swelling and gas
in the distal second toe.

## 2015-07-04 IMAGING — US US RENAL
1 series · 14 of 25 positions shown · non-contrast
Comparison: CT, 07/23/2014.

CLINICAL DATA: Acute kidney injury.

EXAM:
RENAL / URINARY TRACT ULTRASOUND COMPLETE

[Series 1: us renal · 0.34mm/px · 14 of 32 slices shown]
[im 1/32]
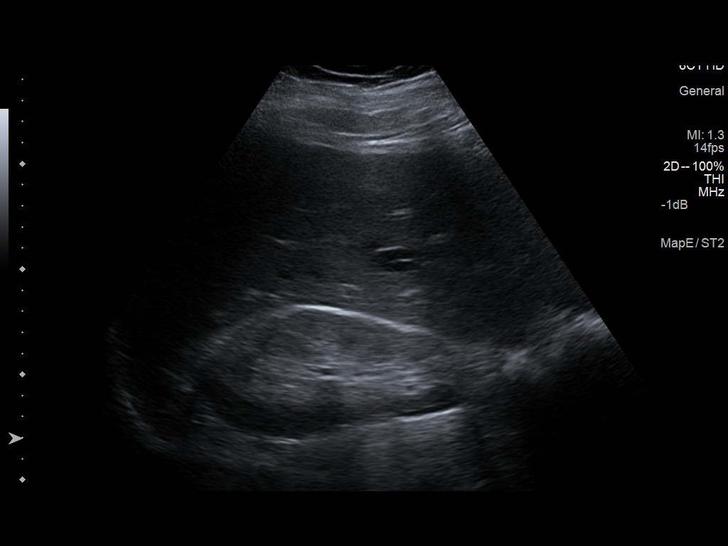
[im 3/32]
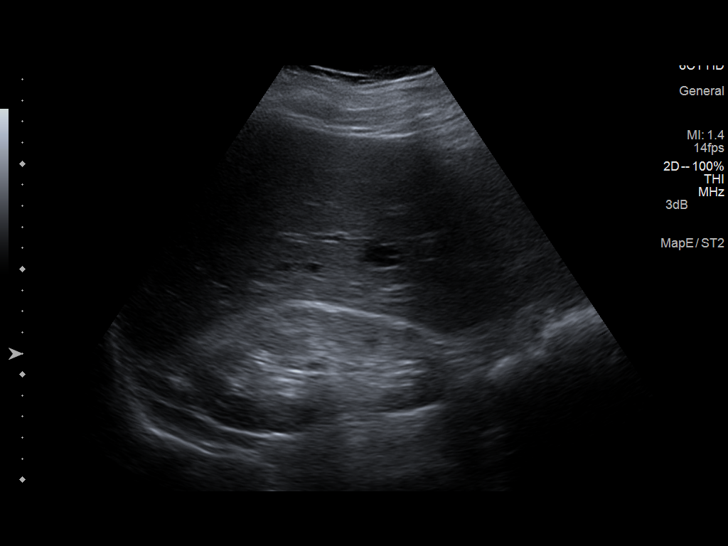
[im 6/32]
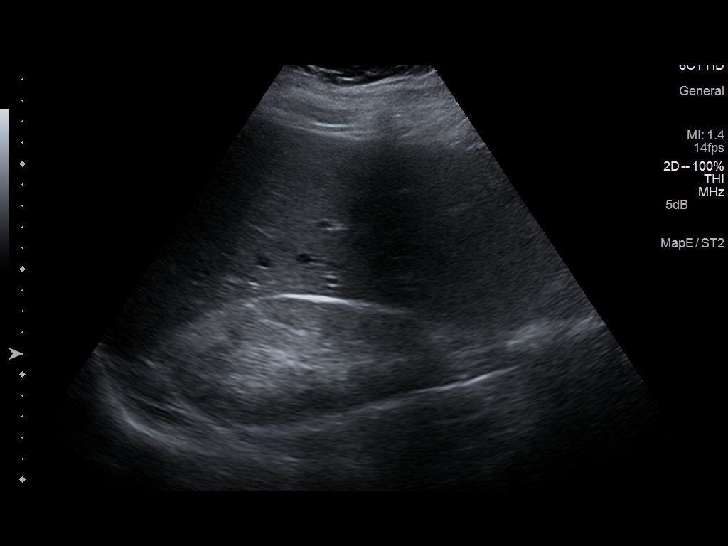
[im 8/32]
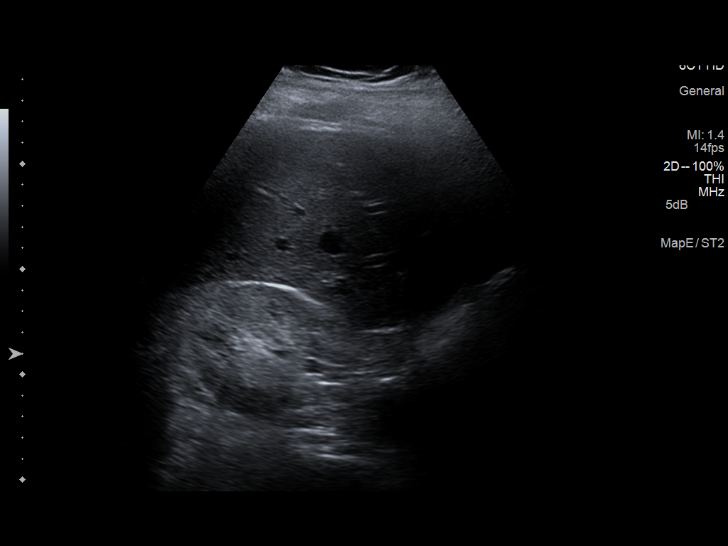
[im 11/32]
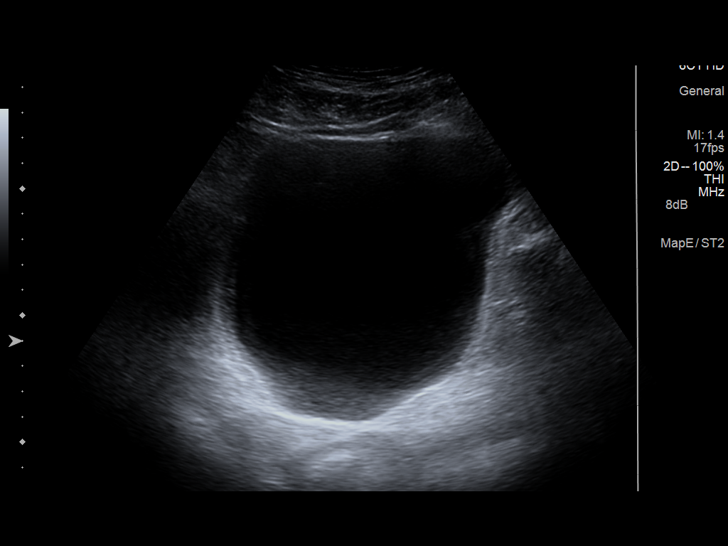
[im 12/32]
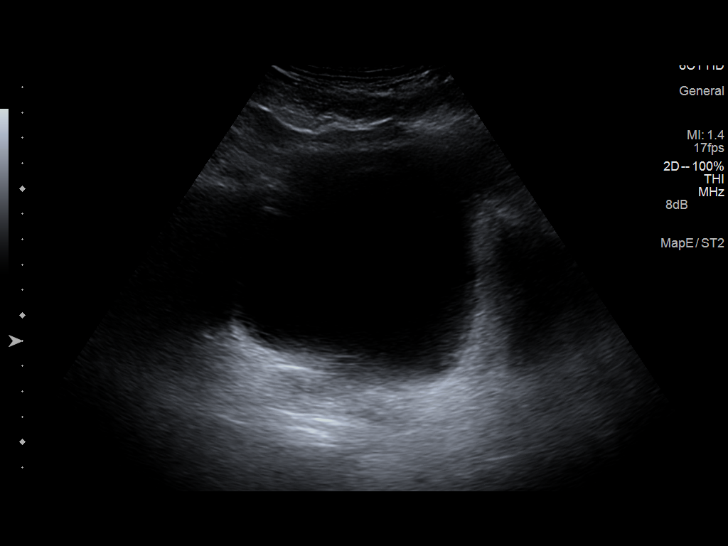
[im 15/32]
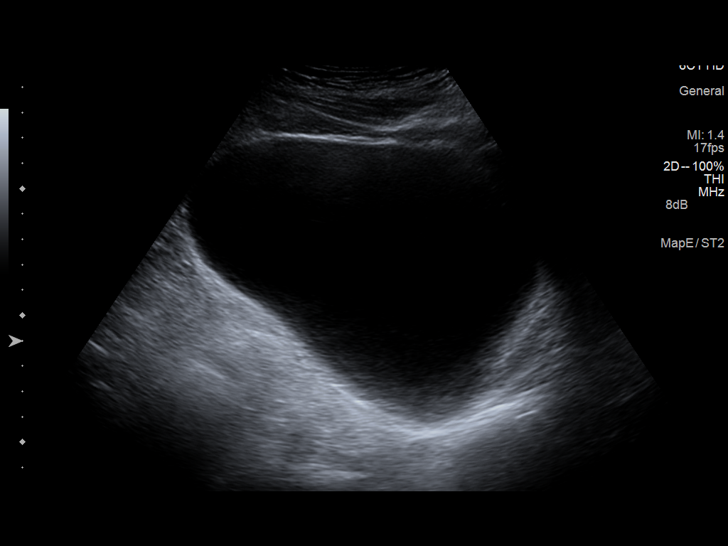
[im 17/32]
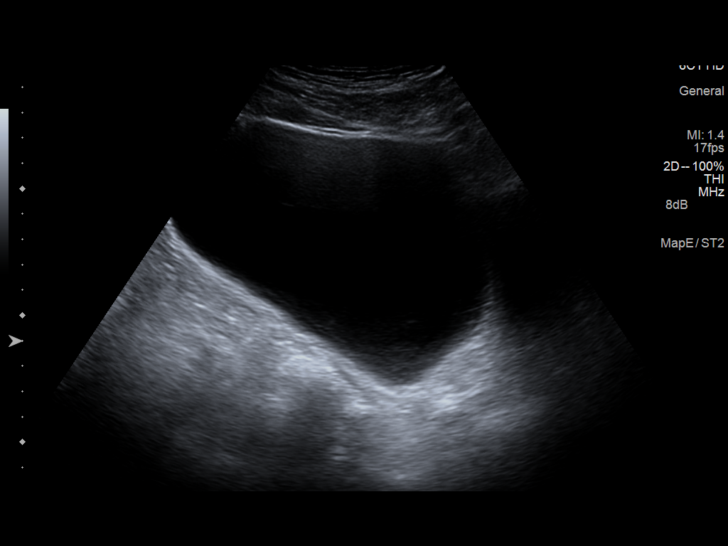
[im 20/32]
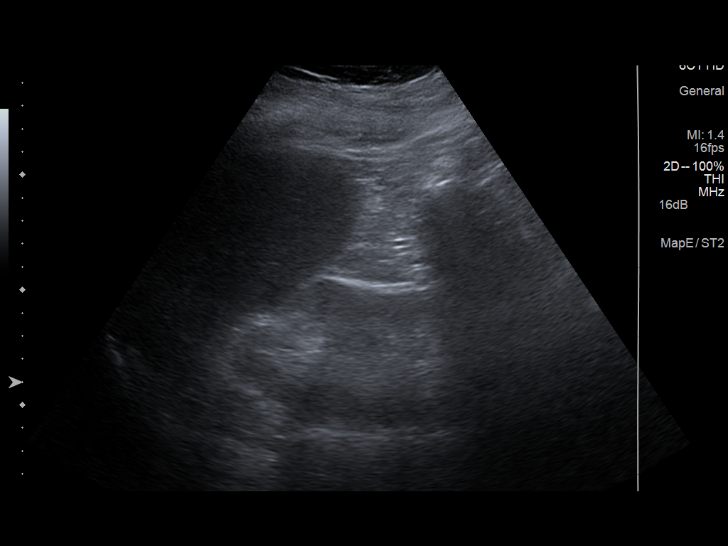
[im 21/32]
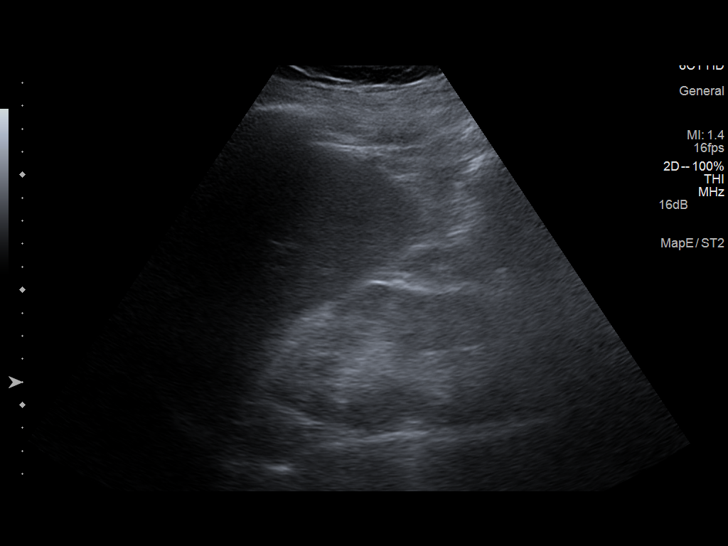
[im 24/32]
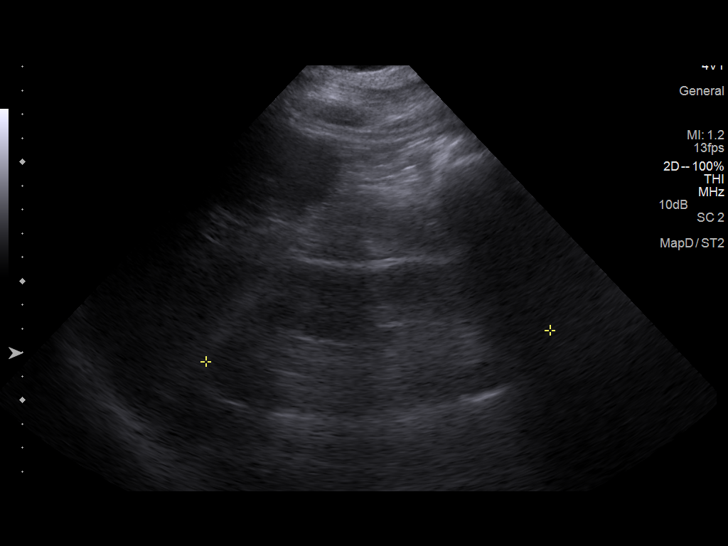
[im 26/32]
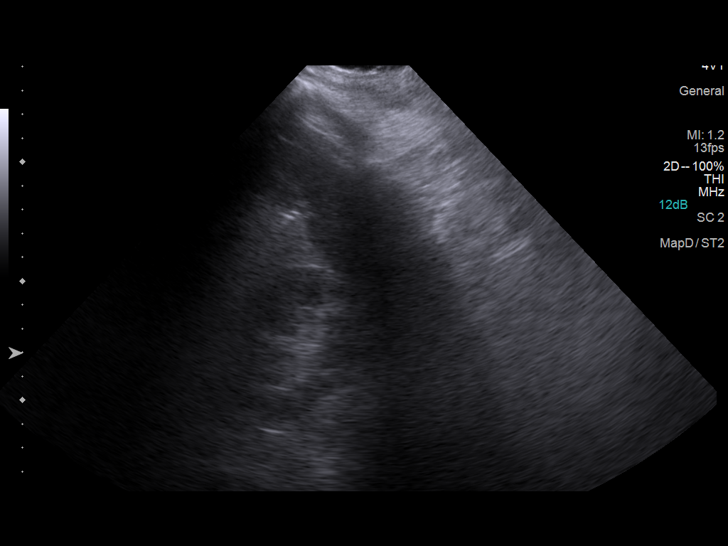
[im 29/32]
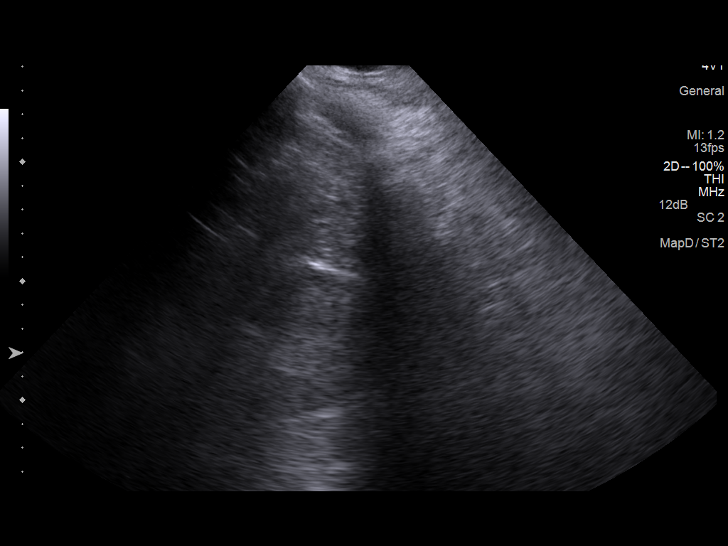
[im 32/32]
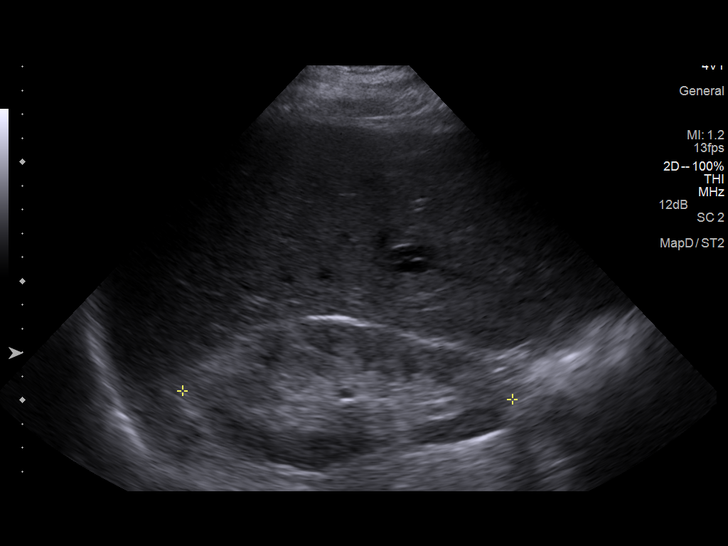

[14 of 25 positions shown; findings below may reference images not displayed]

FINDINGS: Right Kidney:

Length: 13.9 cm. Increased parenchymal echogenicity. No mass or
stone. No hydronephrosis.

Left Kidney:

Length: 14.5 cm. Increased parenchymal echogenicity. No mass or
stone. No hydronephrosis.

Bladder:

Appears normal for degree of bladder distention.
IMPRESSION: Findings consistent with medical renal disease with increased renal
parenchymal echogenicity. No other abnormality. No hydronephrosis.

## 2015-07-04 IMAGING — DX DG CHEST 2V
3 series · 3 of 3 positions shown · non-contrast
Comparison: Radiograph 10/15/2014

CLINICAL DATA: Congestive heart failure

EXAM:
CHEST  2 VIEW

[chest lat (1 of 2)]
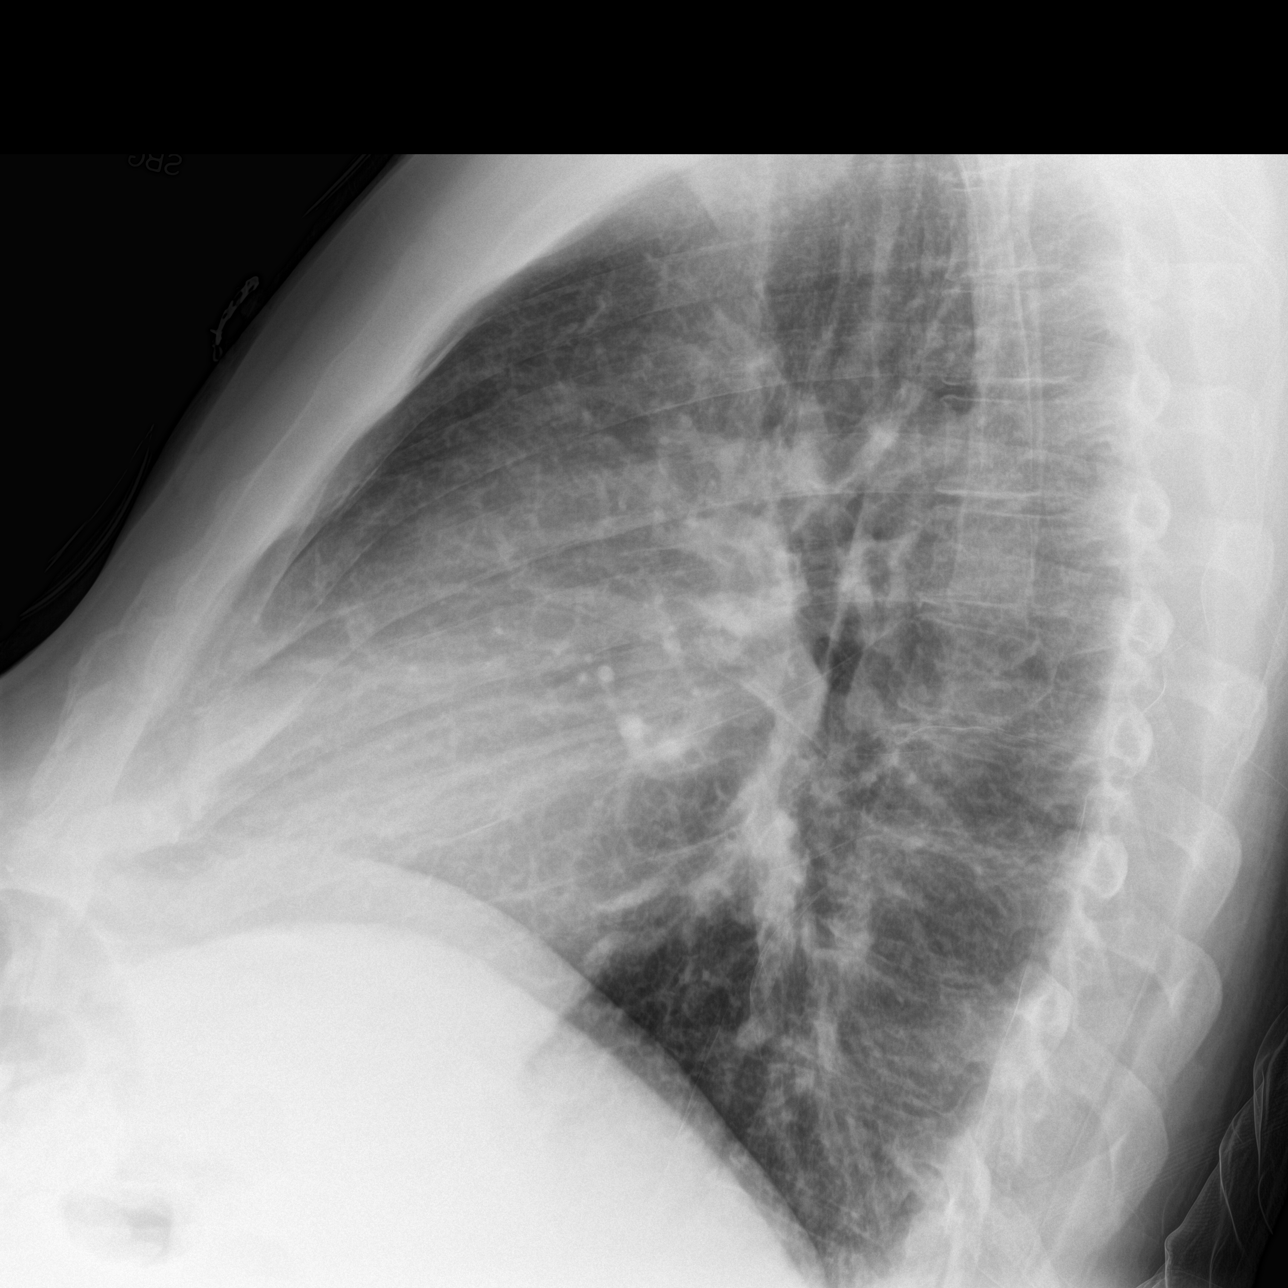

[chest ap]
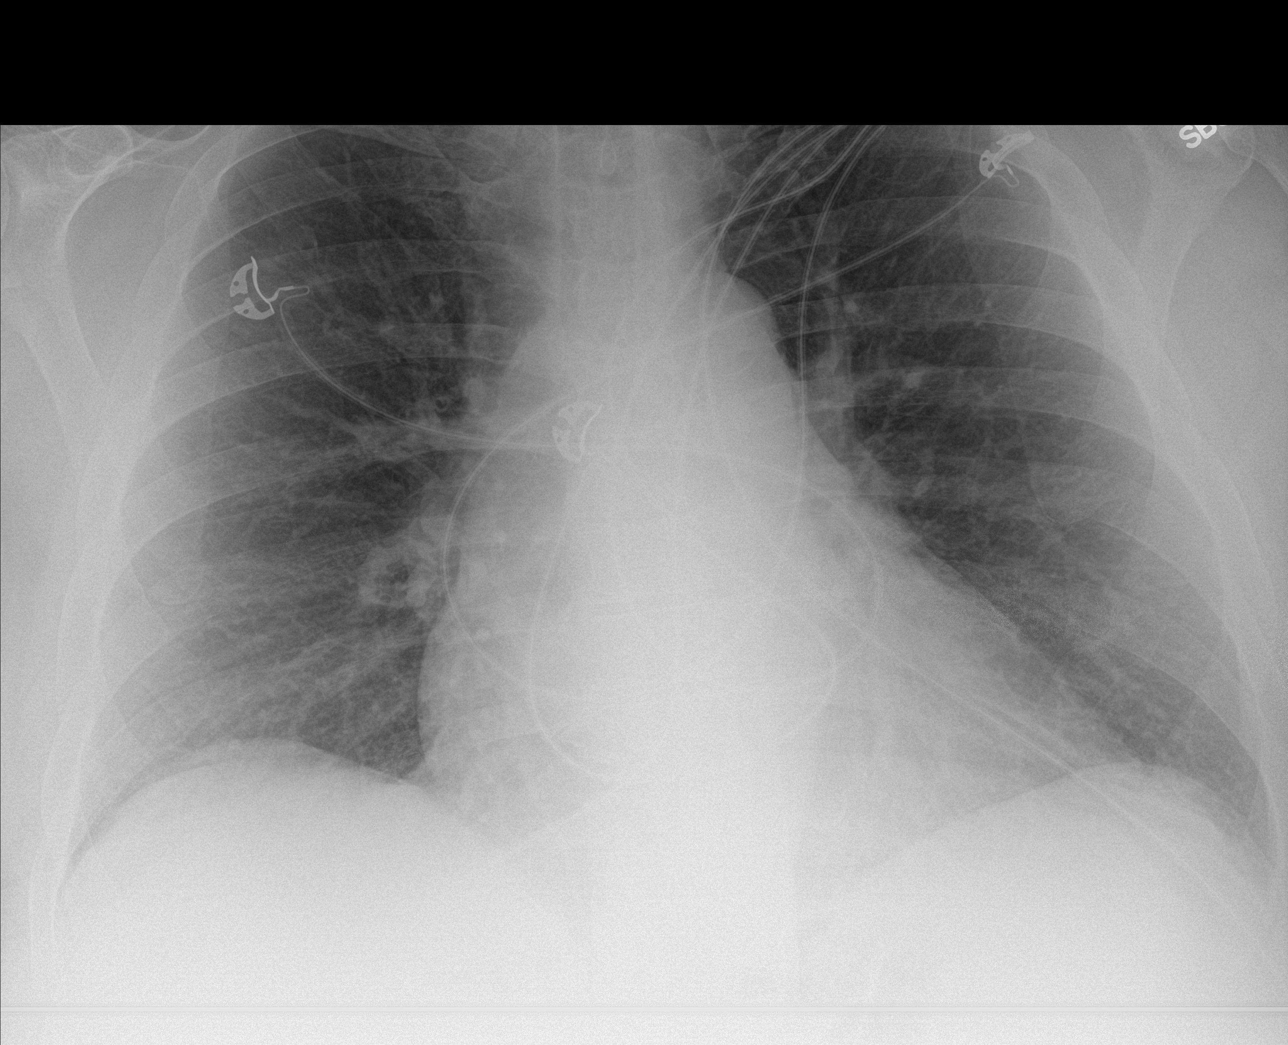

[chest lat (2 of 2)]
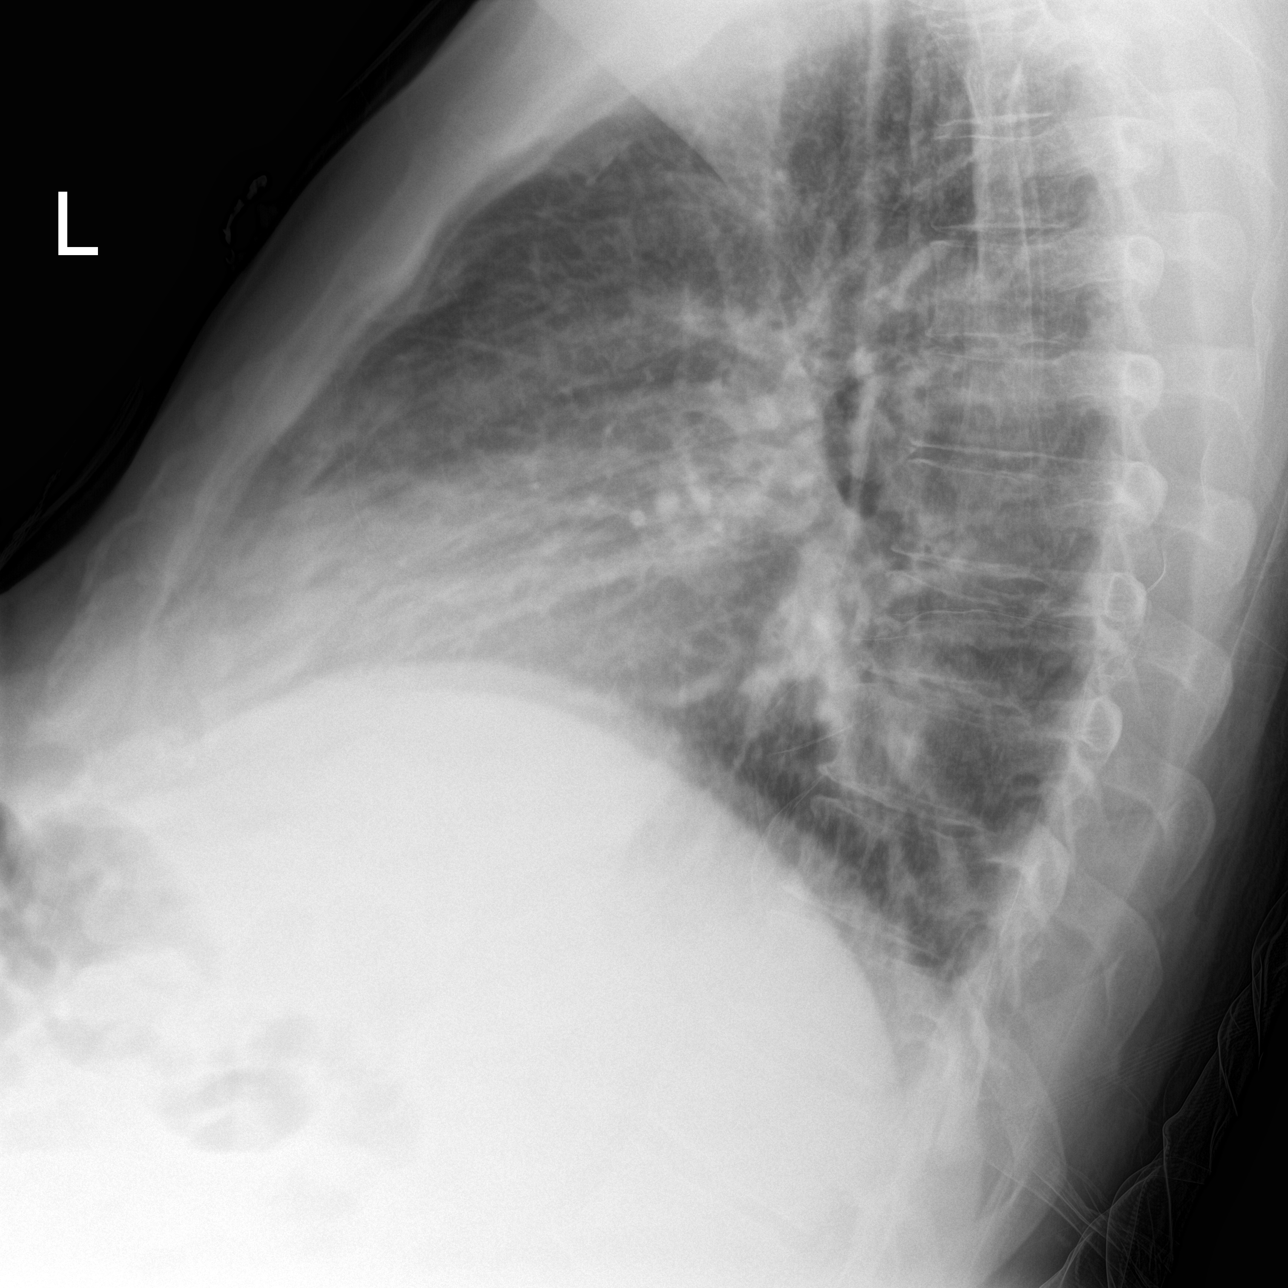

[3 of 3 positions shown; findings below may reference images not displayed]

FINDINGS: Stable enlarged cardiac silhouette. No effusion, infiltrate, or
pneumothorax. No acute osseous abnormality.
IMPRESSION: Cardiomegaly without acute cardiopulmonary findings. No change from
prior.

## 2015-07-16 ENCOUNTER — Telehealth: Payer: Self-pay | Admitting: Cardiovascular Disease

## 2015-07-16 NOTE — Telephone Encounter (Signed)
Spoke with patient who states he is having leg pain and swelling.  I advised him that it is likely that his leg swelling is coming from his poor kidney function.  He complains of some SOB.  He is able to speak in complete sentences while I talked with him on the phone.  He was hospitalized after Christmas for 11 days for respiratory infection. He states he is ready to go to the hospital.  I asked him about any upcoming appointments with nephrology.  He states he has an appointment with [facility] tomorrow.  He states the swelling is worse in left leg.  I advised him that per Dr. Acie Fredrickson he should see the nephrologist tomorrow and that we will follow-up with him after that appointment.  He verbalized understanding and agreement with plan.

## 2015-07-16 NOTE — Telephone Encounter (Signed)
New message      Pt c/o swelling: STAT is pt has developed SOB within 24 hours  1. How long have you been experiencing swelling? Started 3 days ago 2. Where is the swelling located?  Legs---knees to ankles  3.  Are you currently taking a "fluid pill"? yes 4.  Are you currently SOB?  Little sob 5.  Have you traveled recently? no

## 2015-07-17 ENCOUNTER — Telehealth: Payer: Self-pay | Admitting: Cardiovascular Disease

## 2015-07-17 NOTE — Telephone Encounter (Signed)
New Message  Pt requested to speak w/ RN- pt was just seen @ [facility] today 1/20 and was told to follow up w/ our office. Please call back and discuss.

## 2015-07-17 NOTE — Telephone Encounter (Signed)
Pt presented to the office with concerns about lower leg edema, and earlier visit today to nephrology.  Pt states his nephrologist told him to stop the lisinopril, however he wants to know Dr Acie Fredrickson agrees with that plan.  Writer discussed Johnny Sims's concern about ongoing lower leg edema and instruction to stop lisinopril.  Dr Acie Fredrickson confirmed pt should stop the lisinopril and to explain to pt it is "dangerous" for him to continue taking lisinopril.  Writer explained to pt he should stop lisinopril because of worsening renal function.  Pt verbalized understanding, stated no one has explained to him about stopping the lisinopril before.  He confirmed understanding of instructions provided, and requested an appointment to follow up with Dr Acie Fredrickson.  Scheduling assisted patient with appointment. Georgana Curio MHA RN CCM

## 2015-07-20 ENCOUNTER — Ambulatory Visit: Payer: Medicaid Other | Admitting: Cardiovascular Disease

## 2015-07-22 ENCOUNTER — Encounter: Payer: Self-pay | Admitting: Cardiovascular Disease

## 2015-07-24 ENCOUNTER — Other Ambulatory Visit: Payer: Self-pay | Admitting: *Deleted

## 2015-07-24 DIAGNOSIS — N184 Chronic kidney disease, stage 4 (severe): Secondary | ICD-10-CM

## 2015-07-24 DIAGNOSIS — Z01810 Encounter for preprocedural cardiovascular examination: Secondary | ICD-10-CM

## 2015-08-14 ENCOUNTER — Encounter: Payer: Self-pay | Admitting: Vascular Surgery

## 2015-08-21 ENCOUNTER — Ambulatory Visit: Payer: Medicaid Other | Admitting: Vascular Surgery

## 2015-08-21 ENCOUNTER — Encounter (HOSPITAL_COMMUNITY): Payer: Medicaid Other

## 2015-08-21 ENCOUNTER — Other Ambulatory Visit (HOSPITAL_COMMUNITY): Payer: Medicaid Other

## 2015-08-25 ENCOUNTER — Telehealth: Payer: Self-pay | Admitting: Cardiovascular Disease

## 2015-08-25 NOTE — Telephone Encounter (Signed)
Please call,wants to talk to you about his medication.Also says he have some other concerns.

## 2015-08-25 NOTE — Telephone Encounter (Signed)
Left message for patient to call back  

## 2015-08-27 NOTE — Telephone Encounter (Signed)
Spoke with patient who called to report some of his medicines have been changed by his PCP, Dr. Creig Hines, @ [place] ([place]) on [address].  He would like Dr. Acie Fredrickson to call Dr. Creig Hines [phone], fax [phone].  Eliquis has been decreased to 2.5 mg.  I advised that I will call to get a copy of the last office visit.  Patient states he does not want to go back to [place], states he wants Dr. Creig Hines to treat his renal failure.  He states his nephrologist at [facility] talked hateful to him.  I advised that I will make Dr. Acie Fredrickson aware of the call.

## 2015-08-27 NOTE — Telephone Encounter (Signed)
Left message for patient to call back  

## 2015-08-31 NOTE — Telephone Encounter (Signed)
His creatinine has fallen out of the range that I think we can safely give him an DOAC. He should be started on Coumadin - per coumadin clinic . DC Eliquis  He does not have a CVA and does not necessarily need to have bridging.

## 2015-08-31 NOTE — Telephone Encounter (Signed)
I spoke with patient and reviewed Dr. Elmarie Shiley advice.  He verbalized understanding and agreement.  He states he would like to schedule with [facility] next week due to other appointments this week.  He is scheduled for 3/13.  He states he just ordered a new Rx of eliquis from [place] and would I call and advise them that he will not need to pick it up.  I spoke with Manuela Schwartz at [facility] and she verbalized understanding and agreement.

## 2015-09-07 ENCOUNTER — Ambulatory Visit (INDEPENDENT_AMBULATORY_CARE_PROVIDER_SITE_OTHER): Payer: Medicaid Other | Admitting: Pharmacist

## 2015-09-07 ENCOUNTER — Other Ambulatory Visit: Payer: Self-pay | Admitting: Internal Medicine

## 2015-09-07 DIAGNOSIS — I4891 Unspecified atrial fibrillation: Secondary | ICD-10-CM | POA: Diagnosis not present

## 2015-09-07 DIAGNOSIS — Z5181 Encounter for therapeutic drug level monitoring: Secondary | ICD-10-CM | POA: Diagnosis not present

## 2015-09-07 MED ORDER — COUMADIN 10 MG PO TABS: ORAL_TABLET | ORAL | Status: DC

## 2015-09-07 MED ORDER — COUMADIN 7.5 MG PO TABS: ORAL_TABLET | ORAL | Status: DC

## 2015-09-14 ENCOUNTER — Ambulatory Visit (INDEPENDENT_AMBULATORY_CARE_PROVIDER_SITE_OTHER): Payer: Medicaid Other | Admitting: *Deleted

## 2015-09-14 DIAGNOSIS — I4891 Unspecified atrial fibrillation: Secondary | ICD-10-CM | POA: Diagnosis not present

## 2015-09-14 DIAGNOSIS — Z5181 Encounter for therapeutic drug level monitoring: Secondary | ICD-10-CM

## 2015-09-14 LAB — POCT INR: INR: 3

## 2015-10-07 ENCOUNTER — Emergency Department (HOSPITAL_COMMUNITY): Payer: Medicaid Other

## 2015-10-07 ENCOUNTER — Encounter (HOSPITAL_COMMUNITY): Payer: Self-pay | Admitting: *Deleted

## 2015-10-07 ENCOUNTER — Emergency Department (HOSPITAL_COMMUNITY)
Admission: EM | Admit: 2015-10-07 | Discharge: 2015-10-07 | Disposition: A | Discharge status: Home / Self Care | Payer: Medicaid Other | Attending: Emergency Medicine | Admitting: Emergency Medicine

## 2015-10-07 DIAGNOSIS — Z7951 Long term (current) use of inhaled steroids: Secondary | ICD-10-CM | POA: Insufficient documentation

## 2015-10-07 DIAGNOSIS — R21 Rash and other nonspecific skin eruption: Secondary | ICD-10-CM | POA: Diagnosis not present

## 2015-10-07 DIAGNOSIS — E119 Type 2 diabetes mellitus without complications: Secondary | ICD-10-CM | POA: Diagnosis not present

## 2015-10-07 DIAGNOSIS — E039 Hypothyroidism, unspecified: Secondary | ICD-10-CM | POA: Insufficient documentation

## 2015-10-07 DIAGNOSIS — Z8619 Personal history of other infectious and parasitic diseases: Secondary | ICD-10-CM | POA: Insufficient documentation

## 2015-10-07 DIAGNOSIS — Z7901 Long term (current) use of anticoagulants: Secondary | ICD-10-CM | POA: Insufficient documentation

## 2015-10-07 DIAGNOSIS — E785 Hyperlipidemia, unspecified: Secondary | ICD-10-CM | POA: Insufficient documentation

## 2015-10-07 DIAGNOSIS — I129 Hypertensive chronic kidney disease with stage 1 through stage 4 chronic kidney disease, or unspecified chronic kidney disease: Secondary | ICD-10-CM | POA: Insufficient documentation

## 2015-10-07 DIAGNOSIS — F419 Anxiety disorder, unspecified: Secondary | ICD-10-CM | POA: Diagnosis not present

## 2015-10-07 DIAGNOSIS — J441 Chronic obstructive pulmonary disease with (acute) exacerbation: Secondary | ICD-10-CM | POA: Insufficient documentation

## 2015-10-07 DIAGNOSIS — G8929 Other chronic pain: Secondary | ICD-10-CM | POA: Insufficient documentation

## 2015-10-07 DIAGNOSIS — K219 Gastro-esophageal reflux disease without esophagitis: Secondary | ICD-10-CM | POA: Diagnosis not present

## 2015-10-07 DIAGNOSIS — F17210 Nicotine dependence, cigarettes, uncomplicated: Secondary | ICD-10-CM | POA: Insufficient documentation

## 2015-10-07 DIAGNOSIS — Z85820 Personal history of malignant melanoma of skin: Secondary | ICD-10-CM | POA: Diagnosis not present

## 2015-10-07 DIAGNOSIS — Z79899 Other long term (current) drug therapy: Secondary | ICD-10-CM | POA: Insufficient documentation

## 2015-10-07 DIAGNOSIS — I480 Paroxysmal atrial fibrillation: Secondary | ICD-10-CM | POA: Diagnosis not present

## 2015-10-07 DIAGNOSIS — Z794 Long term (current) use of insulin: Secondary | ICD-10-CM | POA: Insufficient documentation

## 2015-10-07 DIAGNOSIS — Z8739 Personal history of other diseases of the musculoskeletal system and connective tissue: Secondary | ICD-10-CM | POA: Diagnosis not present

## 2015-10-07 DIAGNOSIS — N182 Chronic kidney disease, stage 2 (mild): Secondary | ICD-10-CM | POA: Diagnosis not present

## 2015-10-07 DIAGNOSIS — I509 Heart failure, unspecified: Secondary | ICD-10-CM | POA: Diagnosis not present

## 2015-10-07 DIAGNOSIS — R0602 Shortness of breath: Secondary | ICD-10-CM | POA: Diagnosis present

## 2015-10-07 LAB — CBC
HEMATOCRIT: 28.6 % — AB (ref 39.0–52.0)
HEMOGLOBIN: 9.2 g/dL — AB (ref 13.0–17.0)
MCH: 27 pg (ref 26.0–34.0)
MCHC: 32.2 g/dL (ref 30.0–36.0)
MCV: 83.9 fL (ref 78.0–100.0)
Platelets: 191 10*3/uL (ref 150–400)
RBC: 3.41 MIL/uL — ABNORMAL LOW (ref 4.22–5.81)
RDW: 15 % (ref 11.5–15.5)
WBC: 7.4 10*3/uL (ref 4.0–10.5)

## 2015-10-07 LAB — BASIC METABOLIC PANEL
ANION GAP: 12 (ref 5–15)
BUN: 43 mg/dL — AB (ref 6–20)
CHLORIDE: 105 mmol/L (ref 101–111)
CO2: 21 mmol/L — AB (ref 22–32)
Calcium: 8.4 mg/dL — ABNORMAL LOW (ref 8.9–10.3)
Creatinine, Ser: 4.62 mg/dL — ABNORMAL HIGH (ref 0.61–1.24)
GFR calc Af Amer: 15 mL/min — ABNORMAL LOW (ref >60)
GFR calc non Af Amer: 13 mL/min — ABNORMAL LOW (ref >60)
GLUCOSE: 109 mg/dL — AB (ref 65–99)
POTASSIUM: 4.7 mmol/L (ref 3.5–5.1)
Sodium: 138 mmol/L (ref 135–145)

## 2015-10-07 LAB — I-STAT TROPONIN, ED: Troponin i, poc: 0.01 ng/mL (ref 0.00–0.08)

## 2015-10-07 LAB — BRAIN NATRIURETIC PEPTIDE: B Natriuretic Peptide: 604.9 pg/mL — ABNORMAL HIGH (ref 0.0–100.0)

## 2015-10-07 MED ORDER — OXYCODONE HCL 5 MG PO TABS
15.0000 mg | ORAL_TABLET | Freq: Once | ORAL | Status: AC
  Administered 2015-10-07: 15 mg via ORAL
  Filled 2015-10-07: qty 3

## 2015-10-07 MED ORDER — FUROSEMIDE 80 MG PO TABS: 80.0000 mg | ORAL_TABLET | Freq: Two times a day (BID) | ORAL | Status: DC

## 2015-10-07 MED ORDER — FUROSEMIDE 10 MG/ML IJ SOLN
80.0000 mg | Freq: Once | INTRAMUSCULAR | Status: AC
  Administered 2015-10-07: 80 mg via INTRAVENOUS
  Filled 2015-10-07: qty 8

## 2015-10-07 NOTE — ED Provider Notes (Signed)
CSN: [id number]     Arrival date & time 10/07/15  1432 History   First MD Initiated Contact with Patient 10/07/15 1659     Chief Complaint  Patient presents with  . Shortness of Breath  . Leg Swelling     (Consider location/radiation/quality/duration/timing/severity/associated sxs/prior Treatment) Patient is a 60 y.o. male presenting with shortness of breath.  Shortness of Breath Severity:  Mild Onset quality:  Gradual Duration:  3 days Timing:  Constant Progression:  Worsening Chronicity:  Recurrent Context: not activity and not emotional upset   Relieved by:  None tried Worsened by:  Nothing tried Ineffective treatments:  None tried Associated symptoms: cough (when lying flat)   Associated symptoms: no abdominal pain, no chest pain and no fever     Past Medical History  Diagnosis Date  . PAF (paroxysmal atrial fibrillation) ([place])   . Diabetes mellitus   . Hypothyroidism   . Hyperlipidemia   . Hypertension   . Chronic pain   . OA (osteoarthritis)   . COPD (chronic obstructive pulmonary disease) ([place])   . RA (rheumatoid arthritis) ([place])   . Hepatitis C   . Venous stasis   . Bipolar 1 disorder ([place])   . Hepatitis C   . Fibromyalgia   . Asthma   . Anxiety   . Melanoma ([place])     face, shoulder arm  . Pancreatitis, acute 2015  . GERD (gastroesophageal reflux disease)   . CKD (chronic kidney disease), stage II    Past Surgical History  Procedure Laterality Date  . Thyroidectomy    . Tonsillectomy    . US echocardiography  02/19/2010    EF 55-60%  . Skin cancer excision      melonoma  . Fracture surgery Left     arm  . Submandibular gland excision Left 12/25/2013    w/sialolithotomy  . Inguinal hernia repair Bilateral   . Hernia repair      umbicial  . Knee surgery      /H&P 02/20/2010  . Submandibular gland excision Left 12/25/2013    Procedure: SUBMANDIBULAR GLAND STONE AND CHRONIC SIALOLITHIASIS EXCISION;  Surgeon: Melida Quitter, MD;  Location: [facility];   Service: ENT;  Laterality: Left;  . Amputation Left 11/11/2014    Procedure: FOOT, SECOND [place] AMPUTATION;  Surgeon: Newt Minion, MD;  Location: [facility];  Service: Orthopedics;  Laterality: Left;  . Cardioversion  02/03/2015  . Cardioversion N/A 02/03/2015    Procedure: CARDIOVERSION;  Surgeon: Thayer Headings, MD;  Location: [facility] ENDOSCOPY;  Service: Cardiovascular;  Laterality: N/A;   Family History  Problem Relation Age of Onset  . Hypertension Mother   . Alzheimer's disease Mother   . Heart disease Father    Social History  Substance Use Topics  . Smoking status: Current Every Day Smoker -- 1.00 packs/day for 43 years    Types: Cigarettes  . Smokeless tobacco: Never Used  . Alcohol Use: Yes     Comment: rare    Review of Systems  Constitutional: Negative for fever and chills.  Eyes: Negative for pain.  Respiratory: Positive for cough (when lying flat) and shortness of breath.        Orthopnea and PND  Cardiovascular: Positive for leg swelling. Negative for chest pain and palpitations.  Gastrointestinal: Negative for abdominal pain.  All other systems reviewed and are negative.     Allergies  Novolog; Hydrocodone-acetaminophen; Iodides; Other; Sulfacetamide sodium; Tylenol; Gabapentin; Antihistamines, chlorpheniramine-type; Iodinated diagnostic agents; Lidocaine; Pentazocine; Pheniramine; Sulfa  antibiotics; and Vicodin  Home Medications   Prior to Admission medications   Medication Sig Start Date End Date Taking? Authorizing Provider  albuterol (PROVENTIL HFA;VENTOLIN HFA) 108 (90 BASE) MCG/ACT inhaler Inhale 2 puffs into the lungs every 4 (four) hours as needed for wheezing or shortness of breath.   Yes Historical Provider, MD  amitriptyline (ELAVIL) 10 MG tablet Take 10 mg by mouth at bedtime as needed for sleep.  09/21/15  Yes Historical Provider, MD  cholecalciferol (VITAMIN D) 1000 units tablet Take 1,000 Units by mouth daily.   Yes Historical Provider, MD  diazepam  (VALIUM) 10 MG tablet Take 10 mg by mouth every 6 (six) hours. 08/24/15  Yes Historical Provider, MD  fluticasone (FLONASE) 50 MCG/ACT nasal spray Place 1 spray into both nostrils daily as needed for allergies.    Yes Historical Provider, MD  Insulin Glargine (LANTUS SOLOSTAR) 100 UNIT/ML Solostar Pen Inject 30 Units into the skin daily at 10 pm. Patient taking differently: Inject 30-32 Units into the skin daily at 10 pm.  06/25/15  Yes Loleta Chance, MD  levothyroxine (SYNTHROID) 88 MCG tablet Take 1 tablet (88 mcg total) by mouth daily before breakfast. Will send additional refills based off blood level 03/06/15  Yes Lance Bosch, NP  metoprolol (LOPRESSOR) 50 MG tablet Take 1 tablet (50 mg total) by mouth 3 (three) times daily. 03/26/15  Yes Thayer Headings, MD  Multiple Vitamin (MULTIVITAMIN) tablet Take 1 tablet by mouth every morning.    Yes Historical Provider, MD  omeprazole (PRILOSEC) 20 MG capsule Take 1 capsule (20 mg total) by mouth daily. Take with Harvoni at the same time under FASTING condition 03/06/15  Yes Lance Bosch, NP  oxyCODONE (ROXICODONE) 15 MG immediate release tablet Take 30 mg by mouth every 8 (eight) hours. 2 tabs every 8 hours-scheduled 09/21/15  Yes Historical Provider, MD  psyllium (HYDROCIL/METAMUCIL) 95 % PACK Take 1 packet by mouth daily. 06/25/15  Yes Loleta Chance, MD  simvastatin (ZOCOR) 20 MG tablet Take 1 tablet (20 mg total) by mouth daily at 6 PM. 03/06/15  Yes Lance Bosch, NP  SPIRIVA HANDIHALER 18 MCG inhalation capsule INHALE CONTENTS OF ONE CAPSULE ONCE A DAY 04/01/15  Yes Thayer Headings, MD  warfarin (COUMADIN) 5 MG tablet Take 7.5-10 mg by mouth daily at 6 PM. Takes 7.5mg  on thurs and sun only Takes 10mg  all other days   Yes Historical Provider, MD  COUMADIN 10 MG tablet Take as directed by the Coumadin clinic. 09/07/15   Thayer Headings, MD  COUMADIN 7.5 MG tablet Take as directed by Coumadin clinic. 09/07/15   Thayer Headings, MD  furosemide (LASIX) 80 MG  tablet Take 1 tablet (80 mg total) by mouth 2 (two) times daily. 10/08/15 10/10/15  Merrily Pew, MD  nicotine (NICODERM CQ - DOSED IN MG/24 HOURS) 21 mg/24hr patch Place 1 patch (21 mg total) onto the skin daily. 06/25/15   Loleta Chance, MD  SURE COMFORT PEN NEEDLES 31G X 8 MM MISC USE ONE NIGHTLY 09/07/15   Lance Bosch, NP   BP 156/100 mmHg  Pulse 76  Temp(Src) 99.5 F (37.5 C) (Oral)  Resp 12  Wt 299 lb 8 oz (135.852 kg)  SpO2 100% Physical Exam  Constitutional: He is oriented to person, place, and time. He appears well-developed and well-nourished.  HENT:  Head: Normocephalic and atraumatic.  Eyes: Pupils are equal, round, and reactive to light.  Neck: Normal range of motion.  Cardiovascular: Normal rate.   Pulmonary/Chest: Effort normal. No respiratory distress. He has rales (fine crackles in the bases).  Abdominal: Soft. He exhibits no distension. There is no tenderness. There is no guarding.  Musculoskeletal: Normal range of motion. He exhibits edema. He exhibits no tenderness.  Neurological: He is alert and oriented to person, place, and time.  Skin: Skin is warm and dry. Rash (venous stasis rash in BLE) noted. No erythema.  Nursing note and vitals reviewed.   ED Course  Procedures (including critical care time) Labs Review Labs Reviewed  BASIC METABOLIC PANEL - Abnormal; Notable for the following:    CO2 21 (*)    Glucose, Bld 109 (*)    BUN 43 (*)    Creatinine, Ser 4.62 (*)    Calcium 8.4 (*)    GFR calc non Af Amer 13 (*)    GFR calc Af Amer 15 (*)    All other components within normal limits  CBC - Abnormal; Notable for the following:    RBC 3.41 (*)    Hemoglobin 9.2 (*)    HCT 28.6 (*)    All other components within normal limits  BRAIN NATRIURETIC PEPTIDE - Abnormal; Notable for the following:    B Natriuretic Peptide 604.9 (*)    All other components within normal limits  Randolm Idol, ED    Imaging Review Dg Chest 2 View  10/07/2015  CLINICAL  DATA:  60 year old male with shortness of breath and peripheral edema. Initial encounter. EXAM: CHEST  2 VIEW COMPARISON:  06/24/2015 and earlier. FINDINGS: Cardiomegaly has increased since 2015, not significantly changed since 2016. Other mediastinal contours are within normal limits. Increased interstitial markings in both lungs appear related to pulmonary vascular congestion. No pleural effusion. No pneumothorax or other abnormal pulmonary opacity. Visualized tracheal air column is within normal limits. No acute osseous abnormality identified. IMPRESSION: Mild/developing pulmonary interstitial edema. Cardiomegaly has developed since 2015. Electronically Signed   By: Genevie Ann M.D.   On: 10/07/2015 15:19   I have personally reviewed and evaluated these images and lab results as part of my medical decision-making.   EKG Interpretation   Date/Time:  Wednesday October 07 2015 14:39:49 EDT Ventricular Rate:  109 PR Interval:    QRS Duration: 98 QT Interval:  314 QTC Calculation: 422 R Axis:   60 Text Interpretation:  Atrial fibrillation with rapid ventricular response  Abnormal ECG Confirmed by [facility] MD, Corene Cornea [id number]) on 10/07/2015 4:54:24 PM      MDM   Final diagnoses:  Acute on chronic congestive heart failure, unspecified congestive heart failure type (HCC)    chf exacerbation. Diuresed over a liter here with iv lasix some subjective improvement in symptoms. No e/o severe symptoms requiring inpatient management. Stable for dc home.  Will FU w/ PCP in regards to worsening renal function and also will double lasix for the next 3 days adn follow up for further recommendations on chf as well.   New Prescriptions: Discharge Medication List as of 10/07/2015  7:54 PM       I have personally and contemperaneously reviewed labs and imaging and used in my decision making as above.   A medical screening exam was performed and I feel the patient has had an appropriate workup for their chief  complaint at this time and likelihood of emergent condition existing is low. Their vital signs are stable. They have been counseled on decision, discharge, follow up and which symptoms necessitate immediate return to the emergency  department.  They verbally stated understanding and agreement with plan and discharged in stable condition.      Merrily Pew, MD 10/08/15 1038

## 2015-10-07 NOTE — ED Notes (Signed)
Pt reports hx of edema, having increase in swelling recently and no relief with fluid pills. Having increase in sob at night, unable to sleep at night. Denies cp. spo2 98% at triage, ekg done.

## 2015-10-07 NOTE — ED Notes (Signed)
LAb contacted to add on BNP to pts blood work.

## 2016-01-14 ENCOUNTER — Other Ambulatory Visit: Payer: Self-pay | Admitting: *Deleted

## 2016-01-18 ENCOUNTER — Other Ambulatory Visit: Payer: Self-pay

## 2016-01-18 ENCOUNTER — Telehealth: Payer: Self-pay | Admitting: Cardiovascular Disease

## 2016-01-18 NOTE — Telephone Encounter (Signed)
New message     The pt is going to be seen for maybe a surgery procedure procedure and the pt wants to see Dr. Acie Fredrickson ASAP.

## 2016-01-18 NOTE — Telephone Encounter (Signed)
Patient calling in needing an appt w/ Dr. Acie Fredrickson.   Reviewed schedule and nothing available in the next 3-4 weeks.  Patient made aware Dr. Acie Fredrickson and his nurse are both out this week.   He is aware someone will call him next week with a possible appt time in the next 3-4 weeks for overdue follow up and clearance for AV fistula graft placement.

## 2016-01-19 ENCOUNTER — Ambulatory Visit (INDEPENDENT_AMBULATORY_CARE_PROVIDER_SITE_OTHER): Payer: Medicaid Other | Admitting: Pharmacist

## 2016-01-19 DIAGNOSIS — Z5181 Encounter for therapeutic drug level monitoring: Secondary | ICD-10-CM

## 2016-01-19 DIAGNOSIS — I4891 Unspecified atrial fibrillation: Secondary | ICD-10-CM | POA: Diagnosis not present

## 2016-01-19 LAB — POCT INR: INR: 2.9

## 2016-01-19 MED ORDER — COUMADIN 7.5 MG PO TABS: ORAL_TABLET | ORAL | 3 refills | Status: AC

## 2016-01-19 MED ORDER — COUMADIN 10 MG PO TABS: ORAL_TABLET | ORAL | 3 refills | Status: AC

## 2016-01-22 NOTE — Telephone Encounter (Signed)
Notified patient of appointment with Dr. Acie Fredrickson on Wed. 8/16 at 2:30 pm.  He verbalized understanding.

## 2016-02-02 ENCOUNTER — Encounter (HOSPITAL_COMMUNITY): Payer: Self-pay

## 2016-02-02 ENCOUNTER — Emergency Department (HOSPITAL_COMMUNITY): Payer: Medicaid Other

## 2016-02-02 DIAGNOSIS — Z5321 Procedure and treatment not carried out due to patient leaving prior to being seen by health care provider: Secondary | ICD-10-CM | POA: Insufficient documentation

## 2016-02-02 DIAGNOSIS — R0602 Shortness of breath: Secondary | ICD-10-CM | POA: Diagnosis present

## 2016-02-02 LAB — CBC
HCT: 28.5 % — ABNORMAL LOW (ref 39.0–52.0)
Hemoglobin: 9.3 g/dL — ABNORMAL LOW (ref 13.0–17.0)
MCH: 27.4 pg (ref 26.0–34.0)
MCHC: 32.6 g/dL (ref 30.0–36.0)
MCV: 84.1 fL (ref 78.0–100.0)
PLATELETS: 218 10*3/uL (ref 150–400)
RBC: 3.39 MIL/uL — AB (ref 4.22–5.81)
RDW: 16.8 % — ABNORMAL HIGH (ref 11.5–15.5)
WBC: 7.6 10*3/uL (ref 4.0–10.5)

## 2016-02-02 LAB — BASIC METABOLIC PANEL
ANION GAP: 9 (ref 5–15)
BUN: 57 mg/dL — ABNORMAL HIGH (ref 6–20)
CHLORIDE: 106 mmol/L (ref 101–111)
CO2: 21 mmol/L — AB (ref 22–32)
Calcium: 8.6 mg/dL — ABNORMAL LOW (ref 8.9–10.3)
Creatinine, Ser: 6.03 mg/dL — ABNORMAL HIGH (ref 0.61–1.24)
GFR calc non Af Amer: 9 mL/min — ABNORMAL LOW (ref >60)
GFR, EST AFRICAN AMERICAN: 11 mL/min — AB (ref >60)
Glucose, Bld: 160 mg/dL — ABNORMAL HIGH (ref 65–99)
POTASSIUM: 4 mmol/L (ref 3.5–5.1)
SODIUM: 136 mmol/L (ref 135–145)

## 2016-02-02 LAB — I-STAT TROPONIN, ED: Troponin i, poc: 0.02 ng/mL (ref 0.00–0.08)

## 2016-02-02 NOTE — ED Triage Notes (Signed)
To triage via EMS.  Onset today shortness of breath.  Pt reports only has smoked 1/2 pack cigarettes in lieu of 1 pack today.  LE swelling worsened today- took ? Diuretic today x 2.  Pt talking in complete sentences.  NAD.

## 2016-02-02 NOTE — ED Notes (Signed)
Pt stated he was leaving and would go to his doctor's in the morning. I told him just to wait and I would see if I could get a doctor to come and talk with him. I went and got the doctor and he talked with him in triage.

## 2016-02-02 NOTE — ED Provider Notes (Signed)
10:52 PM asked to see patient by triage tech. Patient is stating that he's going to leave. Patient has been in the waiting room and is currently her for shortness of breath, leg swelling, and leg pain. He states he typically receives IV Lasix and 2 shots of morphine. Patient states that he feels like he is fluid overloaded. He has been waiting in the ER waiting room and states that he should not been because he came EMS. I discussed with patient there are no current bed openings and that he will be triaged as typical and brought back per his acuity. He does not appear severely ill. He does appear mildly dyspneic but currently his O2 sats are okay and he is not in respiratory distress. Will add a BNP given his history of heart failure. Charge nurse updated. Patient states he will wait a little bit longer but still might leave. I discussed possible risks of leaving including worsening shortness of breath, heart failure, disability, or death. Patient verbalizes understanding. He appears to have medical decision-making capacity.   Sherwood Gambler, MD 02/02/16 2253

## 2016-02-03 ENCOUNTER — Emergency Department (HOSPITAL_COMMUNITY)
Admission: EM | Admit: 2016-02-03 | Discharge: 2016-02-03 | Disposition: A | Discharge status: Home / Self Care | Payer: Medicaid Other | Attending: Dermatology | Admitting: Dermatology

## 2016-02-03 LAB — PROTIME-INR
INR: 4.25 — AB
Prothrombin Time: 42 seconds — ABNORMAL HIGH (ref 11.4–15.2)

## 2016-02-03 NOTE — ED Notes (Signed)
At 0032 a critical INR of 4+ was reported from lab. Pt left AMA during downtime, dr Oleta Mouse was made aware or result at time it was reported.

## 2016-02-04 ENCOUNTER — Ambulatory Visit (INDEPENDENT_AMBULATORY_CARE_PROVIDER_SITE_OTHER): Payer: Medicaid Other | Admitting: Cardiology

## 2016-02-04 DIAGNOSIS — Z5181 Encounter for therapeutic drug level monitoring: Secondary | ICD-10-CM

## 2016-02-04 DIAGNOSIS — I4891 Unspecified atrial fibrillation: Secondary | ICD-10-CM

## 2016-02-10 ENCOUNTER — Ambulatory Visit (INDEPENDENT_AMBULATORY_CARE_PROVIDER_SITE_OTHER): Payer: Medicaid Other | Admitting: Cardiovascular Disease

## 2016-02-10 ENCOUNTER — Ambulatory Visit (INDEPENDENT_AMBULATORY_CARE_PROVIDER_SITE_OTHER): Payer: Medicaid Other | Admitting: Pharmacist

## 2016-02-10 ENCOUNTER — Encounter (INDEPENDENT_AMBULATORY_CARE_PROVIDER_SITE_OTHER): Payer: Self-pay

## 2016-02-10 ENCOUNTER — Encounter: Payer: Self-pay | Admitting: Cardiovascular Disease

## 2016-02-10 VITALS — BP 140/100 | HR 100 | Ht >= 80 in | Wt 262.4 lb

## 2016-02-10 DIAGNOSIS — Z5181 Encounter for therapeutic drug level monitoring: Secondary | ICD-10-CM | POA: Diagnosis not present

## 2016-02-10 DIAGNOSIS — I4891 Unspecified atrial fibrillation: Secondary | ICD-10-CM

## 2016-02-10 DIAGNOSIS — I10 Essential (primary) hypertension: Secondary | ICD-10-CM

## 2016-02-10 DIAGNOSIS — I482 Chronic atrial fibrillation, unspecified: Secondary | ICD-10-CM

## 2016-02-10 LAB — POCT INR: INR: 2.8

## 2016-02-10 NOTE — Patient Instructions (Signed)
Medication Instructions:  Your physician recommends that you continue on your current medications as directed. Please refer to the Current Medication list given to you today.   Labwork: None Ordered   Testing/Procedures: None Ordered   Follow-Up: Your physician wants you to follow-up in: 6 months with Dr. Nahser.  You will receive a reminder letter in the mail two months in advance. If you don't receive a letter, please call our office to schedule the follow-up appointment.   If you need a refill on your cardiac medications before your next appointment, please call your pharmacy.   Thank you for choosing [facility]! Salley Boxley, RN [phone]    

## 2016-02-10 NOTE — Progress Notes (Signed)
Johnny Sims Date of Birth  1955-08-14       [facility] [place] [address], [address], [place], [place]  [address]   [place], [place]  [address] [phone]     [phone]   Fax  [phone]    Fax [phone]  Problem List: 1. Paroxysmal atrial fibrillation 2. Hypothyroidism 3. Diabetes mellitus 4. Hyperlipidemia 5. Hypertension 6. Chronic pain  History of Present Illness:  Johnny Sims is a 60 yo who I have seen in the past for PAF.  His primary medical office is [facility] in [place].  He is on his third doctor in the past several years. He is not very  happy about this.  He's having lots of abdominal pain. He's had a poor appetite. He complains of being very thirsty and his reflux water. Also complains of polyuria. He states that his glucose levels have been well controlled.  He has having lots of generalized fatigue. She also is having trouble swallowing and chokes when he eats his cereal in the morning.  He denies any chest pain or shortness breath. He was seen in the emergency room in November, 2013 gallbladder pain. He was noted to be in normal sinus rhythm by EKG at that time.  September 26, 2012:  Johnny Sims has been doing well.  He would like to try Xarelto instead of coumadin.  He is doing well.  No CP.  He is having some problems with his Hep C.    Sept. 11, 2014:  Johnny Sims is seen back for eval of his PAF.  Has had a cough.  Still smoking.    October 14, 2013:  No cardiac complaints today.  Having left face pain due to a salivary gland stone.  Also has had some abdominal pain.   Was found to have ? Gall bladder and possibly pancreatitis problems.    He is walking some - several times a week.  Still smoking.    He thinks that he is taking both generic warfarin and brand name coumadin.   January 28, 2015:  Johnny Sims has developed atrial fib. Was set up for cardioversion but did not have a ride home so he cancelled.    Having some shortness of breath.   03/26/2015:  Johnny Sims is seen back for follow-up visit. He had a cardioversion earlier this year but he went back into atrial fibrillation/flutter. Has changed from coumadin to Xarelto.  Aug. 16, 2017:   Johnny Sims is seen back for follow up visit for his atrial fibrillation  Has progressive renal failure - creatinine is now > 6. Now off Xarelto and is on coumadin   Current Outpatient Prescriptions on File Prior to Visit  Medication Sig Dispense Refill  . albuterol (PROVENTIL HFA;VENTOLIN HFA) 108 (90 BASE) MCG/ACT inhaler Inhale 2 puffs into the lungs every 4 (four) hours as needed for wheezing or shortness of breath.    Marland Kitchen amitriptyline (ELAVIL) 10 MG tablet Take 10 mg by mouth at bedtime as needed for sleep.     . cholecalciferol (VITAMIN D) 1000 units tablet Take 1,000 Units by mouth daily.    Marland Kitchen COUMADIN 10 MG tablet Take as directed by the Coumadin clinic. 25 tablet 3  . COUMADIN 7.5 MG tablet Take as directed by Coumadin clinic. 10 tablet 3  . diazepam (VALIUM) 10 MG tablet Take 10 mg by mouth every 6 (six) hours.    Marland Kitchen  fluticasone (FLONASE) 50 MCG/ACT nasal spray Place 1 spray into both nostrils daily as needed for allergies.     . Insulin Glargine (LANTUS SOLOSTAR) 100 UNIT/ML Solostar Pen Inject 30 Units into the skin daily at 10 pm. (Patient taking differently: Inject 30-32 Units into the skin daily at 10 pm. ) 15 mL 11  . levothyroxine (SYNTHROID) 88 MCG tablet Take 1 tablet (88 mcg total) by mouth daily before breakfast. Will send additional refills based off blood level 30 tablet 0  . metoprolol (LOPRESSOR) 50 MG tablet Take 1 tablet (50 mg total) by mouth 3 (three) times daily. 270 tablet 3  . Multiple Vitamin (MULTIVITAMIN) tablet Take 1 tablet by mouth every morning.     . nicotine (NICODERM CQ - DOSED IN MG/24 HOURS) 21 mg/24hr patch Place 1 patch (21 mg total) onto the skin daily. 28 patch 0  . omeprazole (PRILOSEC) 20 MG capsule Take 1 capsule (20  mg total) by mouth daily. Take with Harvoni at the same time under FASTING condition 90 capsule 11  . oxyCODONE (ROXICODONE) 15 MG immediate release tablet Take 30 mg by mouth every 8 (eight) hours. 2 tabs every 8 hours-scheduled    . psyllium (HYDROCIL/METAMUCIL) 95 % PACK Take 1 packet by mouth daily. 56 each 3  . simvastatin (ZOCOR) 20 MG tablet Take 1 tablet (20 mg total) by mouth daily at 6 PM. 90 tablet 5  . SPIRIVA HANDIHALER 18 MCG inhalation capsule INHALE CONTENTS OF ONE CAPSULE ONCE A DAY 30 capsule 11  . SURE COMFORT PEN NEEDLES 31G X 8 MM MISC USE ONE NIGHTLY 100 each 12  . furosemide (LASIX) 80 MG tablet Take 1 tablet (80 mg total) by mouth 2 (two) times daily. 8 tablet 0  . [DISCONTINUED] Insulin Glulisine (APIDRA) 100 UNIT/ML Solostar Pen Inject 28 Units into the skin at bedtime. 15 mL 11   No current facility-administered medications on file prior to visit.     Allergies  Allergen Reactions  . Novolog [Insulin Aspart] Other (See Comments)    "I'm just highly allergic"  . Hydrocodone-Acetaminophen Palpitations and Other (See Comments)    Doesn't want to take  . Iodides Other (See Comments)    Patient states he doesn't know what iodine is and doesn't think he is allergic to it despite it being listed with his allergies  . Other Hives and Other (See Comments)    Steroid creams Steroid creams  . Sulfacetamide Sodium Hives and Itching  . Tylenol [Acetaminophen] Nausea And Vomiting  . Gabapentin Swelling  . Antihistamines, Chlorpheniramine-Type Other (See Comments)    unknown  . Iodinated Diagnostic Agents Other (See Comments)    Patient states he doesn't know what iodine is and doesn't think he is allergic to it despite it being listed with his allergies  . Lidocaine Itching and Other (See Comments)    Patient is uncertain of this allergy  . Pentazocine Other (See Comments)    unknown  . Pheniramine Other (See Comments)    unknown  . Sulfa Antibiotics Itching and Other  (See Comments)  . Vicodin [Hydrocodone-Acetaminophen] Palpitations and Other (See Comments)    Doesn't want to take    Past Medical History:  Diagnosis Date  . Anxiety   . Asthma   . Bipolar 1 disorder ([place])   . Chronic pain   . CKD (chronic kidney disease), stage II   . COPD (chronic obstructive pulmonary disease) ([place])   . Diabetes mellitus   . Fibromyalgia   .  GERD (gastroesophageal reflux disease)   . Hepatitis C   . Hepatitis C   . Hyperlipidemia   . Hypertension   . Hypothyroidism   . Melanoma ([place])    face, shoulder arm  . OA (osteoarthritis)   . PAF (paroxysmal atrial fibrillation) ([place])   . Pancreatitis, acute 2015  . RA (rheumatoid arthritis) ([place])   . Venous stasis     Past Surgical History:  Procedure Laterality Date  . AMPUTATION Left 11/11/2014   Procedure: FOOT, SECOND [place] AMPUTATION;  Surgeon: Newt Minion, MD;  Location: [facility];  Service: Orthopedics;  Laterality: Left;  . CARDIOVERSION  02/03/2015  . CARDIOVERSION N/A 02/03/2015   Procedure: CARDIOVERSION;  Surgeon: Thayer Headings, MD;  Location: [facility];  Service: Cardiovascular;  Laterality: N/A;  . FRACTURE SURGERY Left    arm  . HERNIA REPAIR     umbicial  . INGUINAL HERNIA REPAIR Bilateral   . KNEE SURGERY     /H&P 02/20/2010  . SKIN CANCER EXCISION     melonoma  . SUBMANDIBULAR GLAND EXCISION Left 12/25/2013   w/sialolithotomy  . SUBMANDIBULAR GLAND EXCISION Left 12/25/2013   Procedure: SUBMANDIBULAR GLAND STONE AND CHRONIC SIALOLITHIASIS EXCISION;  Surgeon: Melida Quitter, MD;  Location: [facility];  Service: ENT;  Laterality: Left;  . THYROIDECTOMY    . TONSILLECTOMY    . US ECHOCARDIOGRAPHY  02/19/2010   EF 55-60%    History  Smoking Status  . Current Every Day Smoker  . Packs/day: 1.00  . Years: 43.00  . Types: Cigarettes  Smokeless Tobacco  . Never Used    History  Alcohol Use  . Yes    Comment: rare    Family History  Problem Relation Age of Onset  . Hypertension Mother     . Alzheimer's disease Mother   . Heart disease Father     Reviw of Systems:  Reviewed in the HPI.  All other systems are negative.  Physical Exam: Blood pressure (!) 140/100, pulse 100, height 6\' 9"  (2.057 m), weight 262 lb 6.4 oz (119 kg). General: Well developed, well nourished, in no acute distress.  Head: Normocephalic, atraumatic, sclera non-icteric, mucus membranes are moist,  Poor dentition  Neck: Supple. Carotids are 2 + without bruits. No JVD   Lungs: lungs are clear today   Heart: Irreg. Irreg.  soft systolic murmur.  Abdomen: Soft, non-tender, non-distended with normal bowel sounds.  Msk:  Strength and tone are normal   Extremities: No clubbing or cyanosis. 1-2 +   edema.  Chronic stasis changes.   Distal pedal pulses are 2+ and equal   Neuro: CN II - XII intact.  Alert and oriented X 3.   Psych:  He has a history of bipolar disease.  ECG: Atrial fib at 100, minimal criteria for LVH  Assessment / Plan:   1. atrial fibrillation - he's had persistent atrial fibrillation.    We cardioverted him but unfortunately, he went back into atrial fibrillation. He has significant renal dysfunction. It's difficult to know which antiarrhythmics up he he may respond to.  I would like to increase his metoprolol to 50 mg 3 times a day for better rate control. We will refer him to the A. Fib clinic.  He's been on Xarelto 20 mg a day. He has significant renal dysfunction so we will decrease the Xarelto to 15 mg a day.  We'll schedule for next Tuesday.   2. Hypothyroidism - continue meds.  He needs to follow  up with his medical doctor  3. Chronic kidney disease - stage IV  Follow up with renal   4. Hyperlipidemia  5. Hypertension - BP is elevated.   Will need to go on dialysis soon - creatinine is 6.03 in Aug. 2017 I suspect his BP will improve after dialysis   6. Chronic pain  7.  CKD :  Per int. Med / nephrology .   Mertie Moores, MD  02/10/2016 3:10 PM    [place] [address],  [place], [place]  [address] Pager [phone] Phone: [phone]; Fax: [phone]   [facility]  [address] [place], [place]  [address] [phone]   Fax [phone]

## 2016-03-04 ENCOUNTER — Encounter (HOSPITAL_COMMUNITY): Payer: Self-pay | Admitting: Emergency Medicine

## 2016-03-04 DIAGNOSIS — Y999 Unspecified external cause status: Secondary | ICD-10-CM | POA: Insufficient documentation

## 2016-03-04 DIAGNOSIS — Z85820 Personal history of malignant melanoma of skin: Secondary | ICD-10-CM | POA: Diagnosis not present

## 2016-03-04 DIAGNOSIS — J449 Chronic obstructive pulmonary disease, unspecified: Secondary | ICD-10-CM | POA: Insufficient documentation

## 2016-03-04 DIAGNOSIS — E1122 Type 2 diabetes mellitus with diabetic chronic kidney disease: Secondary | ICD-10-CM | POA: Insufficient documentation

## 2016-03-04 DIAGNOSIS — Y929 Unspecified place or not applicable: Secondary | ICD-10-CM | POA: Insufficient documentation

## 2016-03-04 DIAGNOSIS — F17210 Nicotine dependence, cigarettes, uncomplicated: Secondary | ICD-10-CM | POA: Diagnosis not present

## 2016-03-04 DIAGNOSIS — N182 Chronic kidney disease, stage 2 (mild): Secondary | ICD-10-CM | POA: Diagnosis not present

## 2016-03-04 DIAGNOSIS — E039 Hypothyroidism, unspecified: Secondary | ICD-10-CM | POA: Diagnosis not present

## 2016-03-04 DIAGNOSIS — Z7901 Long term (current) use of anticoagulants: Secondary | ICD-10-CM | POA: Diagnosis not present

## 2016-03-04 DIAGNOSIS — E1142 Type 2 diabetes mellitus with diabetic polyneuropathy: Secondary | ICD-10-CM | POA: Diagnosis not present

## 2016-03-04 DIAGNOSIS — Y939 Activity, unspecified: Secondary | ICD-10-CM | POA: Insufficient documentation

## 2016-03-04 DIAGNOSIS — W208XXA Other cause of strike by thrown, projected or falling object, initial encounter: Secondary | ICD-10-CM | POA: Insufficient documentation

## 2016-03-04 DIAGNOSIS — Z23 Encounter for immunization: Secondary | ICD-10-CM | POA: Insufficient documentation

## 2016-03-04 DIAGNOSIS — Z79899 Other long term (current) drug therapy: Secondary | ICD-10-CM | POA: Insufficient documentation

## 2016-03-04 DIAGNOSIS — S99922A Unspecified injury of left foot, initial encounter: Secondary | ICD-10-CM | POA: Diagnosis not present

## 2016-03-04 DIAGNOSIS — I5033 Acute on chronic diastolic (congestive) heart failure: Secondary | ICD-10-CM | POA: Diagnosis not present

## 2016-03-04 DIAGNOSIS — I130 Hypertensive heart and chronic kidney disease with heart failure and stage 1 through stage 4 chronic kidney disease, or unspecified chronic kidney disease: Secondary | ICD-10-CM | POA: Diagnosis not present

## 2016-03-04 NOTE — ED Triage Notes (Signed)
Pt to ED via [facility] after reported dropping a plate on his left foot.  Pt c/o lac to left 3rd toe.  Pt st's will not stop bleeding due to him being on coumadin.  Dressing not removed in triage

## 2016-03-05 ENCOUNTER — Emergency Department (HOSPITAL_COMMUNITY)
Admission: EM | Admit: 2016-03-05 | Discharge: 2016-03-05 | Disposition: A | Discharge status: Home / Self Care | Payer: Medicaid Other | Attending: Emergency Medicine | Admitting: Emergency Medicine

## 2016-03-05 DIAGNOSIS — S99922A Unspecified injury of left foot, initial encounter: Secondary | ICD-10-CM

## 2016-03-05 MED ORDER — TETANUS-DIPHTH-ACELL PERTUSSIS 5-2.5-18.5 LF-MCG/0.5 IM SUSP
0.5000 mL | Freq: Once | INTRAMUSCULAR | Status: AC
  Administered 2016-03-05: 0.5 mL via INTRAMUSCULAR
  Filled 2016-03-05: qty 0.5

## 2016-03-05 MED ORDER — LIDOCAINE-EPINEPHRINE (PF) 2 %-1:200000 IJ SOLN
10.0000 mL | Freq: Once | INTRAMUSCULAR | Status: DC
  Filled 2016-03-05: qty 20

## 2016-03-05 MED ORDER — CEPHALEXIN 500 MG PO CAPS: 500.0000 mg | ORAL_CAPSULE | Freq: Four times a day (QID) | ORAL | 0 refills | Status: DC

## 2016-03-05 NOTE — ED Notes (Signed)
This RN called pt name in waiting room, yells across stating "I own half this hospital I've been here 20 times in the past year" As we walk in room, pt states he will not have a needle stuck in his toe, we might as well call his orthopedic surgeon and have him come "amputate the toe"

## 2016-03-05 NOTE — ED Provider Notes (Signed)
[facility] DEPT Provider Note   CSN: [id number] Arrival date & time: 03/04/16  2221  By signing my name below, I, Evelene Croon, attest that this documentation has been prepared under the direction and in the presence of Deno Etienne, DO . Electronically Signed: Evelene Croon, Scribe. 03/05/2016. 1:24 AM.   History   Chief Complaint Chief Complaint  Patient presents with  . Laceration    The history is provided by the patient. No language interpreter was used.     HPI Comments:  Johnny Sims is a 60 y.o. male with a history of DM, who presents to the Emergency Department via EMS complaining of a wound to his left 3rd toe s/p injury this evening. Pt states he dropped a plate on his foot. Pt is currently on coumadin, reports difficulty controlling the bleeding. He notes he will follow up with Dr. Donyell Carrell [place] his orthopedic surgeon on 03/07/16. Tetanus status is unknown.  Past Medical History:  Diagnosis Date  . Anxiety   . Asthma   . Bipolar 1 disorder ([place])   . Chronic pain   . CKD (chronic kidney disease), stage II   . COPD (chronic obstructive pulmonary disease) ([place])   . Diabetes mellitus   . Fibromyalgia   . GERD (gastroesophageal reflux disease)   . Hepatitis C   . Hepatitis C   . Hyperlipidemia   . Hypertension   . Hypothyroidism   . Melanoma ([place])    face, shoulder arm  . OA (osteoarthritis)   . PAF (paroxysmal atrial fibrillation) ([place])   . Pancreatitis, acute 2015  . RA (rheumatoid arthritis) ([place])   . Venous stasis     Patient Active Problem List   Diagnosis Date Noted  . Encounter for therapeutic drug monitoring 09/07/2015  . Pyrexia   . Atrial fibrillation with rapid ventricular response ([place]) 06/23/2015  . Upper respiratory tract infection   . Type 2 diabetes mellitus with diabetic polyneuropathy, with long-term current use of insulin ([place])   . CKD (chronic kidney disease)   . Thyroid activity decreased   . Atrial fibrillation status post cardioversion  ([place]) 02/03/2015  . Acute on chronic diastolic CHF (congestive heart failure), NYHA class 3 ([place])   . GERD (gastroesophageal reflux disease) 11/10/2014  . AKI (acute kidney injury) ([place]) 09/25/2014  . Tobacco use disorder 08/28/2014  . Hyperlipidemia 07/16/2012  . Essential hypertension 07/16/2012  . COPD (chronic obstructive pulmonary disease) ([place]) 07/16/2012    Past Surgical History:  Procedure Laterality Date  . AMPUTATION Left 11/11/2014   Procedure: FOOT, SECOND [place] AMPUTATION;  Surgeon: Newt Minion, MD;  Location: [facility];  Service: Orthopedics;  Laterality: Left;  . CARDIOVERSION  02/03/2015  . CARDIOVERSION N/A 02/03/2015   Procedure: CARDIOVERSION;  Surgeon: Thayer Headings, MD;  Location: [facility];  Service: Cardiovascular;  Laterality: N/A;  . FRACTURE SURGERY Left    arm  . HERNIA REPAIR     umbicial  . INGUINAL HERNIA REPAIR Bilateral   . KNEE SURGERY     /H&P 02/20/2010  . SKIN CANCER EXCISION     melonoma  . SUBMANDIBULAR GLAND EXCISION Left 12/25/2013   w/sialolithotomy  . SUBMANDIBULAR GLAND EXCISION Left 12/25/2013   Procedure: SUBMANDIBULAR GLAND STONE AND CHRONIC SIALOLITHIASIS EXCISION;  Surgeon: Melida Quitter, MD;  Location: [facility];  Service: ENT;  Laterality: Left;  . THYROIDECTOMY    . TONSILLECTOMY    . US ECHOCARDIOGRAPHY  02/19/2010   EF 55-60%    Home Medications  Prior to Admission medications   Medication Sig Start Date End Date Taking? Authorizing Provider  albuterol (PROVENTIL HFA;VENTOLIN HFA) 108 (90 BASE) MCG/ACT inhaler Inhale 2 puffs into the lungs every 4 (four) hours as needed for wheezing or shortness of breath.    Historical Provider, MD  amitriptyline (ELAVIL) 10 MG tablet Take 10 mg by mouth at bedtime as needed for sleep.  09/21/15   Historical Provider, MD  cephALEXin (KEFLEX) 500 MG capsule Take 1 capsule (500 mg total) by mouth 4 (four) times daily. 03/05/16   Deno Etienne, DO  cholecalciferol (VITAMIN D) 1000 units tablet Take 1,000  Units by mouth daily.    Historical Provider, MD  COUMADIN 10 MG tablet Take as directed by the Coumadin clinic. 01/19/16   Thayer Headings, MD  COUMADIN 7.5 MG tablet Take as directed by Coumadin clinic. 01/19/16   Thayer Headings, MD  diazepam (VALIUM) 10 MG tablet Take 10 mg by mouth every 6 (six) hours. 08/24/15   Historical Provider, MD  fluticasone (FLONASE) 50 MCG/ACT nasal spray Place 1 spray into both nostrils daily as needed for allergies.     Historical Provider, MD  furosemide (LASIX) 80 MG tablet Take 1 tablet (80 mg total) by mouth 2 (two) times daily. 10/08/15 10/10/15  Merrily Pew, MD  hydrALAZINE (APRESOLINE) 25 MG tablet Take 25 mg by mouth 3 (three) times daily. 02/03/16   Historical Provider, MD  Insulin Glargine (LANTUS SOLOSTAR) 100 UNIT/ML Solostar Pen Inject 30 Units into the skin daily at 10 pm. Patient taking differently: Inject 30-32 Units into the skin daily at 10 pm.  06/25/15   Loleta Chance, MD  levothyroxine (SYNTHROID) 88 MCG tablet Take 1 tablet (88 mcg total) by mouth daily before breakfast. Will send additional refills based off blood level 03/06/15   Lance Bosch, NP  metolazone (ZAROXOLYN) 2.5 MG tablet Take 2.5 mg by mouth daily. 02/03/16   Historical Provider, MD  metoprolol (LOPRESSOR) 50 MG tablet Take 1 tablet (50 mg total) by mouth 3 (three) times daily. 03/26/15   Thayer Headings, MD  Multiple Vitamin (MULTIVITAMIN) tablet Take 1 tablet by mouth every morning.     Historical Provider, MD  nicotine (NICODERM CQ - DOSED IN MG/24 HOURS) 21 mg/24hr patch Place 1 patch (21 mg total) onto the skin daily. 06/25/15   Loleta Chance, MD  omeprazole (PRILOSEC) 20 MG capsule Take 1 capsule (20 mg total) by mouth daily. Take with Harvoni at the same time under FASTING condition 03/06/15   Lance Bosch, NP  oxyCODONE (ROXICODONE) 15 MG immediate release tablet Take 30 mg by mouth every 8 (eight) hours. 2 tabs every 8 hours-scheduled 09/21/15   Historical Provider, MD  psyllium  (HYDROCIL/METAMUCIL) 95 % PACK Take 1 packet by mouth daily. 06/25/15   Loleta Chance, MD  simvastatin (ZOCOR) 20 MG tablet Take 1 tablet (20 mg total) by mouth daily at 6 PM. 03/06/15   Lance Bosch, NP  SPIRIVA HANDIHALER 18 MCG inhalation capsule INHALE CONTENTS OF ONE CAPSULE ONCE A DAY 04/01/15   Thayer Headings, MD  SURE COMFORT PEN NEEDLES 31G X 8 MM MISC USE ONE NIGHTLY 09/07/15   Lance Bosch, NP    Family History Family History  Problem Relation Age of Onset  . Hypertension Mother   . Alzheimer's disease Mother   . Heart disease Father     Social History Social History  Substance Use Topics  . Smoking status: Current Every Day  Smoker    Packs/day: 1.00    Years: 43.00    Types: Cigarettes  . Smokeless tobacco: Never Used  . Alcohol use Yes     Comment: rare     Allergies   Novolog [insulin aspart]; Hydrocodone-acetaminophen; Iodides; Other; Sulfacetamide sodium; Tylenol [acetaminophen]; Gabapentin; Antihistamines, chlorpheniramine-type; Iodinated diagnostic agents; Lidocaine; Pentazocine; Pheniramine; Sulfa antibiotics; and Vicodin [hydrocodone-acetaminophen]   Review of Systems Review of Systems  Constitutional: Negative for unexpected weight change.  Skin: Positive for wound.  Neurological: Negative for weakness.  All other systems reviewed and are negative.  Physical Exam Updated Vital Signs BP 129/82 (BP Location: Right Arm)   Pulse 65   Temp 98.1 F (36.7 C) (Oral)   Resp 18   Ht 6\' 9"  (2.057 m)   Wt 258 lb (117 kg)   SpO2 100%   BMI 27.65 kg/m   Physical Exam  Constitutional: He is oriented to person, place, and time. He appears well-developed and well-nourished.  HENT:  Head: Normocephalic and atraumatic.  Eyes: EOM are normal. Pupils are equal, round, and reactive to light.  Neck: Normal range of motion. Neck supple. No JVD present.  Cardiovascular: Normal rate and regular rhythm.  Exam reveals no gallop and no friction rub.   No murmur  heard. Pulmonary/Chest: No respiratory distress. He has no wheezes.  Abdominal: He exhibits no distension. There is no rebound and no guarding.  Musculoskeletal: Normal range of motion.  Neurological: He is alert and oriented to person, place, and time.  Skin: No rash noted. No pallor.  No visible laceration, macerated area to plantar side of L 3rd toe. Left 3rd toe without toenail, likely chronic finding  Psychiatric: He has a normal mood and affect. His behavior is normal.  Nursing note and vitals reviewed.    ED Treatments / Results  DIAGNOSTIC STUDIES:  Oxygen Saturation is 98% on RA, normal by my interpretation.    COORDINATION OF CARE:  1:17 AM Discussed treatment plan with pt at bedside and pt agreed to plan.  Labs (all labs ordered are listed, but only abnormal results are displayed) Labs Reviewed - No data to display  EKG  EKG Interpretation None       Radiology No results found.  Procedures Procedures (including critical care time)  Medications Ordered in ED Medications  Tdap (BOOSTRIX) injection 0.5 mL (0.5 mLs Intramuscular Given 03/05/16 0136)     Initial Impression / Assessment and Plan / ED Course  I have reviewed the triage vital signs and the nursing notes.  Pertinent labs & imaging results that were available during my care of the patient were reviewed by me and considered in my medical decision making (see chart for details).  Clinical Course    60 yo M With a chief complaints of bleeding from his foot. This happened after he dropped a dinner plate on it. Bleeding is controlled on my exam. He has no noted laceration to repair. Patient is a known vasculopath. Will start on keflex. He wants to follow-up with his orthopedist.  1:55 AM:  I have discussed the diagnosis/risks/treatment options with the patient and believe the pt to be eligible for discharge home to follow-up with Ortho. We also discussed returning to the ED immediately if new or  worsening sx occur. We discussed the sx which are most concerning (e.g., sudden worsening pain, fever, inability to tolerate by mouth) that necessitate immediate return. Medications administered to the patient during their visit and any new prescriptions provided to the  patient are listed below.  Medications given during this visit Medications  Tdap (BOOSTRIX) injection 0.5 mL (0.5 mLs Intramuscular Given 03/05/16 0136)     The patient appears reasonably screen and/or stabilized for discharge and I doubt any other medical condition or other [facility] requiring further screening, evaluation, or treatment in the ED at this time prior to discharge.    Final Clinical Impressions(s) / ED Diagnoses   Final diagnoses:  Toe injury, left, initial encounter    New Prescriptions Discharge Medication List as of 03/05/2016  1:23 AM    START taking these medications   Details  cephALEXin (KEFLEX) 500 MG capsule Take 1 capsule (500 mg total) by mouth 4 (four) times daily., Starting Sat 03/05/2016, Print         I personally performed the services described in this documentation, which was scribed in my presence. The recorded information has been reviewed and is accurate.     Deno Etienne, DO 03/05/16 [id number]

## 2016-03-05 NOTE — ED Notes (Signed)
Patient Alert and oriented X4. Stable and ambulatory. Patient verbalized understanding of the discharge instructions.  Patient belongings were taken by the patient.  

## 2016-03-11 ENCOUNTER — Telehealth: Payer: Self-pay | Admitting: Cardiovascular Disease

## 2016-03-11 NOTE — Telephone Encounter (Signed)
Left message for patient to call back  

## 2016-03-11 NOTE — Telephone Encounter (Signed)
New message     Patient calling wants to speak with nurse regarding previous anesthesiologist Dr. Acie Fredrickson use in the past or any recommendation .

## 2016-03-17 ENCOUNTER — Other Ambulatory Visit: Payer: Self-pay | Admitting: Orthopedic Surgery

## 2016-03-17 ENCOUNTER — Encounter (HOSPITAL_COMMUNITY): Payer: Self-pay | Admitting: Emergency Medicine

## 2016-03-17 NOTE — Progress Notes (Signed)
Pt stated that he called the surgeon's office to cancel; Cheryl, Surgical Coordinator, to follow up with pt.

## 2016-03-18 ENCOUNTER — Ambulatory Visit (HOSPITAL_COMMUNITY): Admission: RE | Admit: 2016-03-18 | Payer: Medicaid Other | Source: Ambulatory Visit | Admitting: Orthopedic Surgery

## 2016-03-18 ENCOUNTER — Encounter (HOSPITAL_COMMUNITY): Admission: RE | Payer: Self-pay | Source: Ambulatory Visit

## 2016-03-18 SURGERY — AMPUTATION DIGIT
Anesthesia: General | Laterality: Right

## 2016-03-26 ENCOUNTER — Other Ambulatory Visit (HOSPITAL_COMMUNITY): Payer: Self-pay

## 2016-03-26 ENCOUNTER — Other Ambulatory Visit: Payer: Self-pay

## 2016-03-26 ENCOUNTER — Inpatient Hospital Stay (HOSPITAL_COMMUNITY)
Admission: EM | Admit: 2016-03-26 | Discharge: 2016-04-02 | DRG: 264 | Disposition: A | Discharge status: Home / Self Care | Payer: Medicaid Other | Attending: Internal Medicine | Admitting: Internal Medicine

## 2016-03-26 ENCOUNTER — Emergency Department (HOSPITAL_COMMUNITY): Payer: Medicaid Other

## 2016-03-26 ENCOUNTER — Encounter (HOSPITAL_COMMUNITY): Payer: Self-pay

## 2016-03-26 DIAGNOSIS — F419 Anxiety disorder, unspecified: Secondary | ICD-10-CM | POA: Diagnosis present

## 2016-03-26 DIAGNOSIS — E1122 Type 2 diabetes mellitus with diabetic chronic kidney disease: Secondary | ICD-10-CM | POA: Diagnosis present

## 2016-03-26 DIAGNOSIS — Z79899 Other long term (current) drug therapy: Secondary | ICD-10-CM

## 2016-03-26 DIAGNOSIS — I480 Paroxysmal atrial fibrillation: Secondary | ICD-10-CM | POA: Diagnosis present

## 2016-03-26 DIAGNOSIS — N185 Chronic kidney disease, stage 5: Secondary | ICD-10-CM | POA: Diagnosis not present

## 2016-03-26 DIAGNOSIS — K219 Gastro-esophageal reflux disease without esophagitis: Secondary | ICD-10-CM | POA: Diagnosis present

## 2016-03-26 DIAGNOSIS — I509 Heart failure, unspecified: Secondary | ICD-10-CM | POA: Diagnosis not present

## 2016-03-26 DIAGNOSIS — E1165 Type 2 diabetes mellitus with hyperglycemia: Secondary | ICD-10-CM | POA: Diagnosis not present

## 2016-03-26 DIAGNOSIS — M069 Rheumatoid arthritis, unspecified: Secondary | ICD-10-CM | POA: Diagnosis present

## 2016-03-26 DIAGNOSIS — J441 Chronic obstructive pulmonary disease with (acute) exacerbation: Secondary | ICD-10-CM | POA: Diagnosis present

## 2016-03-26 DIAGNOSIS — Z419 Encounter for procedure for purposes other than remedying health state, unspecified: Secondary | ICD-10-CM

## 2016-03-26 DIAGNOSIS — E669 Obesity, unspecified: Secondary | ICD-10-CM | POA: Diagnosis present

## 2016-03-26 DIAGNOSIS — J440 Chronic obstructive pulmonary disease with acute lower respiratory infection: Secondary | ICD-10-CM | POA: Diagnosis present

## 2016-03-26 DIAGNOSIS — F319 Bipolar disorder, unspecified: Secondary | ICD-10-CM

## 2016-03-26 DIAGNOSIS — I482 Chronic atrial fibrillation: Secondary | ICD-10-CM | POA: Diagnosis present

## 2016-03-26 DIAGNOSIS — I4891 Unspecified atrial fibrillation: Secondary | ICD-10-CM

## 2016-03-26 DIAGNOSIS — Z6830 Body mass index (BMI) 30.0-30.9, adult: Secondary | ICD-10-CM | POA: Diagnosis not present

## 2016-03-26 DIAGNOSIS — E039 Hypothyroidism, unspecified: Secondary | ICD-10-CM | POA: Diagnosis present

## 2016-03-26 DIAGNOSIS — Z882 Allergy status to sulfonamides status: Secondary | ICD-10-CM

## 2016-03-26 DIAGNOSIS — Z794 Long term (current) use of insulin: Secondary | ICD-10-CM

## 2016-03-26 DIAGNOSIS — M1990 Unspecified osteoarthritis, unspecified site: Secondary | ICD-10-CM | POA: Diagnosis present

## 2016-03-26 DIAGNOSIS — I5033 Acute on chronic diastolic (congestive) heart failure: Secondary | ICD-10-CM | POA: Diagnosis present

## 2016-03-26 DIAGNOSIS — G4733 Obstructive sleep apnea (adult) (pediatric): Secondary | ICD-10-CM | POA: Diagnosis present

## 2016-03-26 DIAGNOSIS — R791 Abnormal coagulation profile: Secondary | ICD-10-CM

## 2016-03-26 DIAGNOSIS — E872 Acidosis: Secondary | ICD-10-CM

## 2016-03-26 DIAGNOSIS — F17210 Nicotine dependence, cigarettes, uncomplicated: Secondary | ICD-10-CM | POA: Diagnosis present

## 2016-03-26 DIAGNOSIS — G8929 Other chronic pain: Secondary | ICD-10-CM | POA: Diagnosis present

## 2016-03-26 DIAGNOSIS — Z885 Allergy status to narcotic agent status: Secondary | ICD-10-CM

## 2016-03-26 DIAGNOSIS — E11622 Type 2 diabetes mellitus with other skin ulcer: Secondary | ICD-10-CM | POA: Diagnosis present

## 2016-03-26 DIAGNOSIS — B1920 Unspecified viral hepatitis C without hepatic coma: Secondary | ICD-10-CM | POA: Diagnosis present

## 2016-03-26 DIAGNOSIS — N2581 Secondary hyperparathyroidism of renal origin: Secondary | ICD-10-CM | POA: Diagnosis present

## 2016-03-26 DIAGNOSIS — Z85830 Personal history of malignant neoplasm of bone: Secondary | ICD-10-CM

## 2016-03-26 DIAGNOSIS — E785 Hyperlipidemia, unspecified: Secondary | ICD-10-CM | POA: Diagnosis present

## 2016-03-26 DIAGNOSIS — J209 Acute bronchitis, unspecified: Secondary | ICD-10-CM | POA: Diagnosis present

## 2016-03-26 DIAGNOSIS — I428 Other cardiomyopathies: Secondary | ICD-10-CM | POA: Diagnosis present

## 2016-03-26 DIAGNOSIS — D649 Anemia, unspecified: Secondary | ICD-10-CM

## 2016-03-26 DIAGNOSIS — Z09 Encounter for follow-up examination after completed treatment for conditions other than malignant neoplasm: Secondary | ICD-10-CM

## 2016-03-26 DIAGNOSIS — I132 Hypertensive heart and chronic kidney disease with heart failure and with stage 5 chronic kidney disease, or end stage renal disease: Secondary | ICD-10-CM | POA: Diagnosis present

## 2016-03-26 DIAGNOSIS — L97529 Non-pressure chronic ulcer of other part of left foot with unspecified severity: Secondary | ICD-10-CM | POA: Diagnosis present

## 2016-03-26 DIAGNOSIS — N186 End stage renal disease: Secondary | ICD-10-CM | POA: Diagnosis present

## 2016-03-26 DIAGNOSIS — Z992 Dependence on renal dialysis: Secondary | ICD-10-CM

## 2016-03-26 DIAGNOSIS — I10 Essential (primary) hypertension: Secondary | ICD-10-CM | POA: Diagnosis present

## 2016-03-26 DIAGNOSIS — I5043 Acute on chronic combined systolic (congestive) and diastolic (congestive) heart failure: Secondary | ICD-10-CM | POA: Diagnosis present

## 2016-03-26 DIAGNOSIS — Z89422 Acquired absence of other left toe(s): Secondary | ICD-10-CM

## 2016-03-26 DIAGNOSIS — E876 Hypokalemia: Secondary | ICD-10-CM | POA: Diagnosis present

## 2016-03-26 DIAGNOSIS — Z7901 Long term (current) use of anticoagulants: Secondary | ICD-10-CM | POA: Diagnosis not present

## 2016-03-26 DIAGNOSIS — N179 Acute kidney failure, unspecified: Secondary | ICD-10-CM | POA: Diagnosis present

## 2016-03-26 DIAGNOSIS — E11621 Type 2 diabetes mellitus with foot ulcer: Secondary | ICD-10-CM | POA: Diagnosis present

## 2016-03-26 DIAGNOSIS — E1142 Type 2 diabetes mellitus with diabetic polyneuropathy: Secondary | ICD-10-CM | POA: Diagnosis present

## 2016-03-26 DIAGNOSIS — I872 Venous insufficiency (chronic) (peripheral): Secondary | ICD-10-CM | POA: Diagnosis present

## 2016-03-26 DIAGNOSIS — N19 Unspecified kidney failure: Secondary | ICD-10-CM | POA: Diagnosis present

## 2016-03-26 DIAGNOSIS — Z85820 Personal history of malignant melanoma of skin: Secondary | ICD-10-CM

## 2016-03-26 DIAGNOSIS — Z79891 Long term (current) use of opiate analgesic: Secondary | ICD-10-CM

## 2016-03-26 DIAGNOSIS — M797 Fibromyalgia: Secondary | ICD-10-CM | POA: Diagnosis present

## 2016-03-26 DIAGNOSIS — Z01810 Encounter for preprocedural cardiovascular examination: Secondary | ICD-10-CM | POA: Diagnosis not present

## 2016-03-26 DIAGNOSIS — Z888 Allergy status to other drugs, medicaments and biological substances status: Secondary | ICD-10-CM

## 2016-03-26 DIAGNOSIS — R000 Tachycardia, unspecified: Secondary | ICD-10-CM | POA: Diagnosis present

## 2016-03-26 LAB — CBC WITH DIFFERENTIAL/PLATELET
Basophils Absolute: 0 10*3/uL (ref 0.0–0.1)
Basophils Relative: 0 %
EOS PCT: 0 %
Eosinophils Absolute: 0 10*3/uL (ref 0.0–0.7)
HCT: 27.2 % — ABNORMAL LOW (ref 39.0–52.0)
HEMOGLOBIN: 8.9 g/dL — AB (ref 13.0–17.0)
LYMPHS ABS: 1.2 10*3/uL (ref 0.7–4.0)
LYMPHS PCT: 8 %
MCH: 28.6 pg (ref 26.0–34.0)
MCHC: 32.7 g/dL (ref 30.0–36.0)
MCV: 87.5 fL (ref 78.0–100.0)
MONOS PCT: 5 %
Monocytes Absolute: 0.7 10*3/uL (ref 0.1–1.0)
NEUTROS PCT: 87 %
Neutro Abs: 12.1 10*3/uL — ABNORMAL HIGH (ref 1.7–7.7)
Platelets: 163 10*3/uL (ref 150–400)
RBC: 3.11 MIL/uL — AB (ref 4.22–5.81)
RDW: 19.3 % — ABNORMAL HIGH (ref 11.5–15.5)
WBC: 13.9 10*3/uL — AB (ref 4.0–10.5)

## 2016-03-26 LAB — BRAIN NATRIURETIC PEPTIDE: B Natriuretic Peptide: 894.3 pg/mL — ABNORMAL HIGH (ref 0.0–100.0)

## 2016-03-26 LAB — I-STAT TROPONIN, ED: Troponin i, poc: 0.05 ng/mL (ref 0.00–0.08)

## 2016-03-26 LAB — COMPREHENSIVE METABOLIC PANEL
ALBUMIN: 3.4 g/dL — AB (ref 3.5–5.0)
ALK PHOS: 42 U/L (ref 38–126)
ALT: 21 U/L (ref 17–63)
AST: 29 U/L (ref 15–41)
Anion gap: 18 — ABNORMAL HIGH (ref 5–15)
BILIRUBIN TOTAL: 1.1 mg/dL (ref 0.3–1.2)
BUN: 161 mg/dL — AB (ref 6–20)
CALCIUM: 7 mg/dL — AB (ref 8.9–10.3)
CO2: 16 mmol/L — ABNORMAL LOW (ref 22–32)
CREATININE: 6.68 mg/dL — AB (ref 0.61–1.24)
Chloride: 98 mmol/L — ABNORMAL LOW (ref 101–111)
GFR calc Af Amer: 9 mL/min — ABNORMAL LOW (ref >60)
GFR, EST NON AFRICAN AMERICAN: 8 mL/min — AB (ref >60)
GLUCOSE: 238 mg/dL — AB (ref 65–99)
POTASSIUM: 2.9 mmol/L — AB (ref 3.5–5.1)
Sodium: 132 mmol/L — ABNORMAL LOW (ref 135–145)
TOTAL PROTEIN: 6.8 g/dL (ref 6.5–8.1)

## 2016-03-26 LAB — PROTIME-INR
INR: 6.94 — AB
PROTHROMBIN TIME: 62.2 s — AB (ref 11.4–15.2)

## 2016-03-26 LAB — GLUCOSE, CAPILLARY
Glucose-Capillary: 256 mg/dL — ABNORMAL HIGH (ref 65–99)
Glucose-Capillary: 313 mg/dL — ABNORMAL HIGH (ref 65–99)

## 2016-03-26 LAB — MAGNESIUM: Magnesium: 1.7 mg/dL (ref 1.7–2.4)

## 2016-03-26 LAB — MRSA PCR SCREENING: MRSA BY PCR: NEGATIVE

## 2016-03-26 MED ORDER — LEVOFLOXACIN IN D5W 500 MG/100ML IV SOLN: 500.0000 mg | INTRAVENOUS | Status: DC

## 2016-03-26 MED ORDER — MORPHINE SULFATE (PF) 2 MG/ML IV SOLN: 2.0000 mg | Freq: Once | INTRAVENOUS | Status: DC

## 2016-03-26 MED ORDER — ALBUTEROL SULFATE (2.5 MG/3ML) 0.083% IN NEBU: 2.5000 mg | INHALATION_SOLUTION | RESPIRATORY_TRACT | Status: DC | PRN

## 2016-03-26 MED ORDER — INSULIN GLARGINE 100 UNIT/ML ~~LOC~~ SOLN
25.0000 [IU] | Freq: Every day | SUBCUTANEOUS | Status: DC
  Administered 2016-03-26: 25 [IU] via SUBCUTANEOUS
  Filled 2016-03-26 (×2): qty 0.25

## 2016-03-26 MED ORDER — IPRATROPIUM-ALBUTEROL 0.5-2.5 (3) MG/3ML IN SOLN
3.0000 mL | Freq: Four times a day (QID) | RESPIRATORY_TRACT | Status: DC
  Administered 2016-03-26 – 2016-03-28 (×6): 3 mL via RESPIRATORY_TRACT
  Filled 2016-03-26 (×6): qty 3

## 2016-03-26 MED ORDER — NITROGLYCERIN 0.4 MG SL SUBL: 0.4000 mg | SUBLINGUAL_TABLET | SUBLINGUAL | Status: DC | PRN

## 2016-03-26 MED ORDER — METOPROLOL TARTRATE 50 MG PO TABS
50.0000 mg | ORAL_TABLET | Freq: Every evening | ORAL | Status: DC
  Administered 2016-03-26 – 2016-03-27 (×2): 50 mg via ORAL
  Filled 2016-03-26 (×2): qty 1

## 2016-03-26 MED ORDER — METOPROLOL TARTRATE 100 MG PO TABS
100.0000 mg | ORAL_TABLET | Freq: Every morning | ORAL | Status: DC
  Administered 2016-03-27: 100 mg via ORAL
  Filled 2016-03-26: qty 1

## 2016-03-26 MED ORDER — PREDNISONE 20 MG PO TABS
40.0000 mg | ORAL_TABLET | Freq: Every day | ORAL | Status: DC
  Administered 2016-03-27 – 2016-03-30 (×4): 40 mg via ORAL
  Filled 2016-03-26 (×4): qty 2

## 2016-03-26 MED ORDER — WARFARIN - PHARMACIST DOSING INPATIENT: Freq: Every day | Status: DC

## 2016-03-26 MED ORDER — HYDRALAZINE HCL 25 MG PO TABS
25.0000 mg | ORAL_TABLET | Freq: Three times a day (TID) | ORAL | Status: DC
  Administered 2016-03-26 – 2016-03-29 (×10): 25 mg via ORAL
  Filled 2016-03-26 (×10): qty 1

## 2016-03-26 MED ORDER — MAGNESIUM SULFATE IN D5W 1-5 GM/100ML-% IV SOLN
1.0000 g | Freq: Once | INTRAVENOUS | Status: AC
  Administered 2016-03-26: 1 g via INTRAVENOUS
  Filled 2016-03-26 (×2): qty 100

## 2016-03-26 MED ORDER — NICOTINE 14 MG/24HR TD PT24
14.0000 mg | MEDICATED_PATCH | Freq: Every day | TRANSDERMAL | Status: DC
  Administered 2016-03-26 – 2016-04-02 (×7): 14 mg via TRANSDERMAL
  Filled 2016-03-26 (×7): qty 1

## 2016-03-26 MED ORDER — MORPHINE SULFATE (PF) 2 MG/ML IV SOLN
1.0000 mg | Freq: Once | INTRAVENOUS | Status: AC
  Administered 2016-03-26: 1 mg via INTRAVENOUS
  Filled 2016-03-26: qty 1

## 2016-03-26 MED ORDER — DIAZEPAM 5 MG PO TABS
10.0000 mg | ORAL_TABLET | Freq: Four times a day (QID) | ORAL | Status: DC
  Administered 2016-03-26 – 2016-04-02 (×26): 10 mg via ORAL
  Filled 2016-03-26 (×26): qty 2

## 2016-03-26 MED ORDER — LEVOTHYROXINE SODIUM 88 MCG PO TABS
88.0000 ug | ORAL_TABLET | Freq: Every day | ORAL | Status: DC
  Administered 2016-03-27 – 2016-04-01 (×6): 88 ug via ORAL
  Filled 2016-03-26 (×6): qty 1

## 2016-03-26 MED ORDER — NITROGLYCERIN 2 % TD OINT
0.5000 [in_us] | TOPICAL_OINTMENT | Freq: Four times a day (QID) | TRANSDERMAL | Status: DC
  Administered 2016-03-26 – 2016-03-30 (×15): 0.5 [in_us] via TOPICAL
  Filled 2016-03-26: qty 1
  Filled 2016-03-26: qty 30

## 2016-03-26 MED ORDER — LEVOFLOXACIN IN D5W 750 MG/150ML IV SOLN
750.0000 mg | Freq: Once | INTRAVENOUS | Status: AC
  Administered 2016-03-26: 750 mg via INTRAVENOUS
  Filled 2016-03-26: qty 150

## 2016-03-26 MED ORDER — FLUTICASONE PROPIONATE 50 MCG/ACT NA SUSP
1.0000 | Freq: Every day | NASAL | Status: DC | PRN
  Filled 2016-03-26: qty 16

## 2016-03-26 MED ORDER — SODIUM CHLORIDE 0.9% FLUSH
3.0000 mL | Freq: Two times a day (BID) | INTRAVENOUS | Status: DC
  Administered 2016-03-26 – 2016-04-01 (×8): 3 mL via INTRAVENOUS

## 2016-03-26 MED ORDER — VITAMIN D 1000 UNITS PO TABS
1000.0000 [IU] | ORAL_TABLET | Freq: Every day | ORAL | Status: DC
  Administered 2016-03-26 – 2016-04-02 (×7): 1000 [IU] via ORAL
  Filled 2016-03-26 (×7): qty 1

## 2016-03-26 MED ORDER — FUROSEMIDE 10 MG/ML IJ SOLN
120.0000 mg | Freq: Two times a day (BID) | INTRAVENOUS | Status: DC
  Administered 2016-03-26 – 2016-03-28 (×5): 120 mg via INTRAVENOUS
  Filled 2016-03-26 (×7): qty 12

## 2016-03-26 MED ORDER — SIMVASTATIN 20 MG PO TABS
20.0000 mg | ORAL_TABLET | Freq: Every day | ORAL | Status: DC
  Administered 2016-03-26 – 2016-04-01 (×7): 20 mg via ORAL
  Filled 2016-03-26 (×7): qty 1

## 2016-03-26 MED ORDER — FUROSEMIDE 10 MG/ML IJ SOLN: 40.0000 mg | Freq: Once | INTRAMUSCULAR | Status: DC

## 2016-03-26 MED ORDER — ONDANSETRON HCL 4 MG/2ML IJ SOLN: 4.0000 mg | Freq: Three times a day (TID) | INTRAMUSCULAR | Status: AC | PRN

## 2016-03-26 MED ORDER — POTASSIUM CHLORIDE CRYS ER 20 MEQ PO TBCR
40.0000 meq | EXTENDED_RELEASE_TABLET | Freq: Two times a day (BID) | ORAL | Status: DC
  Administered 2016-03-26: 40 meq via ORAL
  Filled 2016-03-26: qty 2

## 2016-03-26 MED ORDER — FENTANYL CITRATE (PF) 100 MCG/2ML IJ SOLN
25.0000 ug | Freq: Once | INTRAMUSCULAR | Status: AC
  Administered 2016-03-26: 25 ug via INTRAVENOUS
  Filled 2016-03-26: qty 2

## 2016-03-26 MED ORDER — POTASSIUM CHLORIDE CRYS ER 20 MEQ PO TBCR
40.0000 meq | EXTENDED_RELEASE_TABLET | Freq: Two times a day (BID) | ORAL | Status: DC
  Administered 2016-03-26 – 2016-03-27 (×3): 40 meq via ORAL
  Filled 2016-03-26 (×3): qty 2

## 2016-03-26 MED ORDER — FUROSEMIDE 10 MG/ML IJ SOLN
80.0000 mg | Freq: Once | INTRAMUSCULAR | Status: AC
  Administered 2016-03-26: 80 mg via INTRAVENOUS
  Filled 2016-03-26: qty 8

## 2016-03-26 MED ORDER — ALBUTEROL SULFATE HFA 108 (90 BASE) MCG/ACT IN AERS
2.0000 | INHALATION_SPRAY | RESPIRATORY_TRACT | Status: DC | PRN
  Filled 2016-03-26: qty 6.7

## 2016-03-26 MED ORDER — MORPHINE SULFATE (PF) 2 MG/ML IV SOLN
1.0000 mg | INTRAVENOUS | Status: DC | PRN
  Administered 2016-03-26 – 2016-03-27 (×3): 1 mg via INTRAVENOUS
  Filled 2016-03-26 (×3): qty 1

## 2016-03-26 NOTE — H&P (Signed)
Date: 03/26/2016               Patient Name:  Johnny Sims MRN: [id number]  DOB: 03-Oct-1955 Age / Sex: 60 y.o., male   PCP: Virginia Rochester, PA         Medical Service: Internal Medicine Teaching Service         Attending Physician: Dr. Leonard Schwartz, MD    First Contact: Dr. Philipp Ovens Pager: [phone]  Second Contact: Dr. Quay Burow Pager: [phone]       After Hours (After 5p/  First Contact Pager: [phone]  weekends / holidays): Second Contact Pager: [phone]   Chief Complaint: shortness of breath   History of Present Illness: Johnny Sims is a 60 year old man with history of HFpEF, AFib, CKD V, COPD, and DM2 who presents to the ED with 5 days of shortness of breath.  He reports increased shortness of breath, DOE, orthopnea, productive cough with brown strands which is new, and increased wheezing for the past week. He denies fever, chills.  After using the bathroom this morning, he was walking towards his recliner when he had to lower himself to the floor due to shortness of breath and fatigue. He denies hitting his head.  He then decided to call EMS.  He states his weight has increased from 257 to 315 in the last few months.  Discoloration and bruising of his legs has developed in the last month, though he says the swelling has been up and down.   Patient was supposed to have a toe amputation last week due but due to his other co-morbidities that may lead to further complications.  Meds:  No outpatient prescriptions have been marked as taking for the 03/26/16 encounter [facility] Encounter).  He cannot remember the names of his medicines.  He endorses taking metoprolol, lasix, and synthroid as prescribed, and is unclear regarding others.  Allergies: Allergies as of 03/26/2016 - Review Complete 03/26/2016  Allergen Reaction Noted  . Novolog [insulin aspart] Other (See Comments) 09/26/2014  . Hydrocodone-acetaminophen Palpitations and Other (See Comments) 06/23/2015  . Iodides Other (See  Comments) 10/07/2015  . Other Hives and Other (See Comments) 10/14/2013  . Sulfacetamide sodium Hives and Itching 10/07/2015  . Tylenol [acetaminophen] Nausea And Vomiting 06/23/2015  . Gabapentin Swelling 10/07/2015  . Antihistamines, chlorpheniramine-type Other (See Comments) 03/31/2011  . Iodinated diagnostic agents Other (See Comments) 03/31/2011  . Lidocaine Itching and Other (See Comments) 10/14/2013  . Pentazocine Other (See Comments) 10/07/2015  . Pheniramine Other (See Comments) 10/07/2015  . Sulfa antibiotics Itching and Other (See Comments) 07/22/2012  . Vicodin [hydrocodone-acetaminophen] Palpitations and Other (See Comments) 03/31/2011   Past Medical History:  Diagnosis Date  . Anxiety   . Asthma   . Bipolar 1 disorder ([place])   . Chronic pain   . CKD (chronic kidney disease), stage II   . COPD (chronic obstructive pulmonary disease) ([place])   . Diabetes mellitus   . Fibromyalgia   . GERD (gastroesophageal reflux disease)   . Hepatitis C   . Hepatitis C   . Hyperlipidemia   . Hypertension   . Hypothyroidism   . Melanoma ([place])    face, shoulder arm  . OA (osteoarthritis)   . PAF (paroxysmal atrial fibrillation) ([place])   . Pancreatitis, acute 2015  . RA (rheumatoid arthritis) ([place])   . Venous stasis    Family History:  Father: Cancer  Social History: Tobacco Abuse: 1/2 ppd for 45 years Alcohol Use: Occasional  Illicit Drugs: Denies  Review of Systems: A complete ROS was negative except as per HPI.  Physical Exam: Blood pressure 153/95, pulse 78, temperature 98.1 F (36.7 C), temperature source Oral, resp. rate 20, height 6\' 10"  (2.083 m), weight 287 lb 12.8 oz (130.5 kg), SpO2 99 %.  Physical Exam  Constitutional: He is oriented to person, place, and time.  Obese, breathing nosily, in no distress  HENT:  Head: Normocephalic and atraumatic.  Mouth/Throat: Oropharynx is clear and moist.  Neck:  Unable to assess JVD due to body habitus  Cardiovascular:    Irregularly irregular rhythm, normal S1S2, no murmur or gallop noted, but cardiac auscultation limited by remote heart sounds and wheezing. Intact radial and DP pulses  Pulmonary/Chest:  No respiratory distress Diffuse wheezing, prolonged expiratory phase, coarse bibasilar crackles  Abdominal: Soft. He exhibits no distension. There is no tenderness.  Musculoskeletal:  2+ bilateral LE edema to knees Left 2nd toe amputed Left 3rd toe black eschar vs necrosis of distal phalanx  Neurological: He is alert and oriented to person, place, and time.  Skin:  Extensive discoloration and ecchymoses of bilateral legs  Psychiatric: He has a normal mood and affect. His behavior is normal.   EKG: Afib, no ST changes, Q waves, TWI  CXR (03/26/2016):  IMPRESSION: 1. Cardiomegaly with pulmonary edema pattern consistent with CHF/volume overload. 2. A more confluent dense opacity at the left lung base (lingula) could be asymmetric alveolar pulmonary edema or superimposed pneumonia. In the absence of fever, favor asymmetric pulmonary edema. 3. Probable small left pleural effusion.  CBC Latest Ref Rng & Units 03/26/2016 02/02/2016 10/07/2015  WBC 4.0 - 10.5 K/uL 13.9(H) 7.6 7.4  Hemoglobin 13.0 - 17.0 g/dL 8.9(L) 9.3(L) 9.2(L)  Hematocrit 39.0 - 52.0 % 27.2(L) 28.5(L) 28.6(L)  Platelets 150 - 400 K/uL 163 218 191   CMP Latest Ref Rng & Units 03/26/2016 02/02/2016 10/07/2015  Glucose 65 - 99 mg/dL 238(H) 160(H) 109(H)  BUN 6 - 20 mg/dL 161(H) 57(H) 43(H)  Creatinine 0.61 - 1.24 mg/dL 6.68(H) 6.03(H) 4.62(H)  Sodium 135 - 145 mmol/L 132(L) 136 138  Potassium 3.5 - 5.1 mmol/L 2.9(L) 4.0 4.7  Chloride 101 - 111 mmol/L 98(L) 106 105  CO2 22 - 32 mmol/L 16(L) 21(L) 21(L)  Calcium 8.9 - 10.3 mg/dL 7.0(L) 8.6(L) 8.4(L)  Total Protein 6.5 - 8.1 g/dL 6.8 - -  Total Bilirubin 0.3 - 1.2 mg/dL 1.1 - -  Alkaline Phos 38 - 126 U/L 42 - -  AST 15 - 41 U/L 29 - -  ALT 17 - 63 U/L 21 - -   Component     Latest Ref  Rng & Units 01/19/2016 02/02/2016 02/10/2016 03/26/2016             Prothrombin Time     11.4 - 15.2 seconds  42.0 (H)  62.2 (H)  INR      2.9 4.25 (HH) 2.8 6.94 (HH)   BNP (last 3 results)  Recent Labs  06/23/15 0916 10/07/15 1452 03/26/16 1215  BNP 420.3* 604.9* 894.3*   Troponin (Point of Care Test)  Recent Labs  03/26/16 1225  TROPIPOC 0.05    Assessment & Plan by Problem: Principal Problem:   Acute on chronic diastolic CHF (congestive heart failure), NYHA class 3 (HCC) Active Problems:   Hyperlipidemia   Essential hypertension   COPD (chronic obstructive pulmonary disease) (HCC)   GERD (gastroesophageal reflux disease)   Atrial fibrillation status post cardioversion (HCC)   Type 2  diabetes mellitus with diabetic polyneuropathy, with long-term current use of insulin (HCC)   CKD (chronic kidney disease), stage V (HCC)   Hypokalemia  Johnny Palmese is a 60 year old man with history of diastolic HFpEF, AFib, CKD V, COPD, and DM2 who presents to the ED with complaint of shortness of breath.  His dyspnea is likely multifactorial, with contributions of volume overload from CHF and CKD, as well as COPD.  He is volume overloaded with pulmonary and peripheral edema and elevated BNP.  Additionally, he is wheezing and reports increased sputum production and cough, consistent with COPD exacerbation.  Negative troponin and EKG without ischemic changes and no chest pain makes ACS unlikely.  He is anticoagulated and [place] 0 makes PE quite unlikely.   #HFpEF Cardiology consulted, recommendations appreciated.  Diuresis may be difficult with his poor renal function. -Lasix 120mg  IV BID -Continue home metoprolol 100 mg AM and 50 mg PM -Continue home hydralazine 25mg  TID -Echo  #COPD Exacerbation   -Duonebs Q6H -Prednisone 40 mg PO for 5 days (last 10/4) -Levofloxacin for 5 days  #Acute on Chronic Renal Failure His renal disease has rapidly progressed over the last 2 years, from creatinine  of ~1.00 in 12/2013 now to over 6. He has scheduled outpatient follow-up with him. Back in May of 2016, he was worked up for glomerulonephritis, as he has chronic hematuria and proteinuria, but this was completely negative. He was scheduled for outpatient follow-up at that time, but did not make his appointment.  -Nephrology consult  #Atrial fibrillation Rates well controlled -Continue home metoprolol (100 mg AM and 50 mg PM)  #Supratherapeutic INR INR 6.9, no active bleeding. -Hold warfarin today -Daily INR -Pharmacy anticoagulation consult  #Hypokalemia -Telemetry -40 mg PO KCl BID -Follow BMP  #Anemia Chronic normocytic anemia seems to be getting worse, likely with progression of his CKD.  May be a minor contributor, but symptomatic anemia is less likely to be driving his symptoms than COPD and CHF.  #DM2 On Lantus 30U daily at home. -Lantus 25U QHS  #Hypothyroidism -Continune home synthroid  #Hyperlipidemia -Continue home statin  #Anxiety -Continue home diazepam 10 mg Q6H   Dispo: Admit patient to Inpatient with expected length of stay greater than 2 midnights.  Signed: Minus Liberty, MD 03/26/2016, 2:46 PM  Pager: [phone]

## 2016-03-26 NOTE — Progress Notes (Signed)
ANTICOAGULATION CONSULT NOTE - Initial Consult  Pharmacy Consult for warfarin Indication: atrial fibrillation  Allergies  Allergen Reactions  . Novolog [Insulin Aspart] Other (See Comments)    UNSPECIFIED REACTION  "I'm just highly allergic"  . Hydrocodone-Acetaminophen Palpitations and Other (See Comments)    Doesn't want to take  . Iodides Other (See Comments)    Patient states he doesn't know what iodine is and doesn't think he is allergic to it despite it being listed with his allergies  . Other Hives and Other (See Comments)    Steroid creams   . Sulfacetamide Sodium Hives and Itching  . Tylenol [Acetaminophen] Nausea And Vomiting  . Gabapentin Swelling    SWELLING REACTION UNSPECIFIED   . Antihistamines, Chlorpheniramine-Type Other (See Comments)    unknown  . Iodinated Diagnostic Agents Other (See Comments)    Patient states he doesn't know what iodine is and doesn't think he is allergic to it despite it being listed with his allergies  . Lidocaine Itching and Other (See Comments)    Patient is uncertain of this allergy  . Pentazocine Other (See Comments)    unknown  . Pheniramine Other (See Comments)    unknown  . Sulfa Antibiotics Itching and Other (See Comments)  . Vicodin [Hydrocodone-Acetaminophen] Palpitations and Other (See Comments)    Doesn't want to take   Patient Measurements: Height: 6\' 10"  (208.3 cm) Weight: 287 lb 12.8 oz (130.5 kg) IBW/kg (Calculated) : 100.6    Vital Signs: Temp: 98.1 F (36.7 C) (09/30 1148) Temp Source: Oral (09/30 1148) BP: 150/91 (09/30 1530) Pulse Rate: 87 (09/30 1530)  Labs:  Recent Labs  03/26/16 1215  HGB 8.9*  HCT 27.2*  PLT 163  LABPROT 62.2*  INR 6.94*  CREATININE 6.68*    Estimated Creatinine Clearance: 18.7 mL/min (by C-G formula based on SCr of 6.68 mg/dL (H)).   Medical History: Past Medical History:  Diagnosis Date  . Anxiety   . Asthma   . Bipolar 1 disorder ([place])   . Chronic pain   . CKD  (chronic kidney disease), stage II   . COPD (chronic obstructive pulmonary disease) ([place])   . Diabetes mellitus   . Fibromyalgia   . GERD (gastroesophageal reflux disease)   . Hepatitis C   . Hepatitis C   . Hyperlipidemia   . Hypertension   . Hypothyroidism   . Melanoma ([place])    face, shoulder arm  . OA (osteoarthritis)   . PAF (paroxysmal atrial fibrillation) ([place])   . Pancreatitis, acute 2015  . RA (rheumatoid arthritis) ([place])   . Venous stasis    Assessment: 80 yoM admitted 9/30 on warfarin PTA for atrial fibrillation. INR on admission 6.94. CBC stable. No reported bleeding.   Goal of Therapy:  INR 2-3 Monitor platelets by anticoagulation protocol: Yes   Plan:  1. Hold warfarin tonight  2. Daily INR  Vincenza Hews, PharmD, BCPS 03/26/2016, 4:36 PM Pager: [phone]

## 2016-03-26 NOTE — Progress Notes (Signed)
MD made aware that patient states he is having anxiety attacks and still in pain after PRN morphine given. Patient states "I ripped off all my leads because I had a panic attack". New telemetry leads applied to patient, repositioning in bed or chair (patient refuse) and emotional support offered to patient. MD to see patient.

## 2016-03-26 NOTE — Progress Notes (Signed)
Per MD Wynetta Emery, ok to administer hydralazine even though on patients allergy list as benefits outweigh risk.

## 2016-03-26 NOTE — ED Triage Notes (Signed)
To room via EMS.  Onset 5 days productive cough, wheezing, and shortness of breath.  Used breathing treatments, inhalers and spiriva at home, has not increased frequency of treatments or inhaler, doing same regimen.  EMS gave Albuterol 5mg  x 2 treatments.  Pt talking in complete sentences.

## 2016-03-26 NOTE — Progress Notes (Signed)
Pharmacy Antibiotic Note Johnny Sims is a 60 y.o. male admitted on 03/26/2016 with COPD exacerbation. Pharmacy has been consulted for Levaquin dosing in setting of CKD stage V.  Plan: 1. Levaquin 750 mg IV x 1 now followed by 500 mg IV every 48 hours   Height: 6\' 10"  (208.3 cm) Weight: 287 lb 12.8 oz (130.5 kg) IBW/kg (Calculated) : 100.6  Temp (24hrs), Avg:98.1 F (36.7 C), Min:98.1 F (36.7 C), Max:98.1 F (36.7 C)   Recent Labs Lab 03/26/16 1215  WBC 13.9*  CREATININE 6.68*    Estimated Creatinine Clearance: 18.7 mL/min (by C-G formula based on SCr of 6.68 mg/dL (H)).    Allergies  Allergen Reactions  . Novolog [Insulin Aspart] Other (See Comments)    UNSPECIFIED REACTION  "I'm just highly allergic"  . Hydrocodone-Acetaminophen Palpitations and Other (See Comments)    Doesn't want to take  . Iodides Other (See Comments)    Patient states he doesn't know what iodine is and doesn't think he is allergic to it despite it being listed with his allergies  . Other Hives and Other (See Comments)    Steroid creams   . Sulfacetamide Sodium Hives and Itching  . Tylenol [Acetaminophen] Nausea And Vomiting  . Gabapentin Swelling    SWELLING REACTION UNSPECIFIED   . Antihistamines, Chlorpheniramine-Type Other (See Comments)    unknown  . Iodinated Diagnostic Agents Other (See Comments)    Patient states he doesn't know what iodine is and doesn't think he is allergic to it despite it being listed with his allergies  . Lidocaine Itching and Other (See Comments)    Patient is uncertain of this allergy  . Pentazocine Other (See Comments)    unknown  . Pheniramine Other (See Comments)    unknown  . Sulfa Antibiotics Itching and Other (See Comments)  . Vicodin [Hydrocodone-Acetaminophen] Palpitations and Other (See Comments)    Doesn't want to take    Antimicrobials this admission: 9/30  >>   Dose adjustments this admission: n/a  Microbiology results: px  Thank you for  allowing pharmacy to be a part of this patient's care.  Vincenza Hews, PharmD, BCPS 03/26/2016, 4:26 PM Pager: [phone]

## 2016-03-26 NOTE — Progress Notes (Signed)
Patient admitted to 2C11. Belongings in room are 2 rings, 1 watch, 1 bracelet, cell phone with charger, clothing, wallet, and prescription bottle with pills. Bottle label states the pills are Valium 10 mg. Counted all of the pills in the bottle with patient. There are 62 pills that are blue in color stamped with #10. There was one green pill stamped with #10.   Patient informed that per protocol he is not allowed to keep home medications with him in his room, they must be sent to pharmacy to hold and return upon discharge. Patient refuses to release his home medications to RN to be transported to pharmacy. MD Wynetta Emery made aware and states that they will come and speak with patient regarding this matter.

## 2016-03-26 NOTE — Consult Note (Addendum)
CONSULT NOTE  Date: 03/26/2016               Patient Name:  Johnny Sims MRN: [id number]  DOB: 1955/08/10 Age / Sex: 60 y.o., male        PCP: [facility] Primary Cardiologist: Cardiology             Referring Physician: Audie Pinto              Reason for Consult: CHF, renal failure            History of Present Illness: Patient is a 60 y.o. male with a PMHx of staqe V CKD, atrial fib. , who was admitted to [facility] on 03/26/2016 for evaluation of volume overload, worsening renal function.   Dyllin has had progressive dyspnea and fluid retention for the past month or so He eats lots of canned foods - does not cook much  Presented with significant leg edema  And shortness of breath .  Creatinine has increased from 4.5 to 6.6 over the past 5 months.    Medications: Outpatient medications:  (Not in a hospital admission)  Current medications: Current Facility-Administered Medications  Medication Dose Route Frequency Provider Last Rate Last Dose  . magnesium sulfate IVPB 1 g 100 mL  1 g Intravenous Once Leonard Schwartz, MD   1 g at 03/26/16 1427  . morphine 2 MG/ML injection 1 mg  1 mg Intravenous Once Leonard Schwartz, MD      . nitroGLYCERIN (NITROGLYN) 2 % ointment 0.5 inch  0.5 inch Topical Q6H Leonard Schwartz, MD      . nitroGLYCERIN (NITROSTAT) SL tablet 0.4 mg  0.4 mg Sublingual Q5 min PRN Leonard Schwartz, MD       Current Outpatient Prescriptions  Medication Sig Dispense Refill  . albuterol (PROVENTIL HFA;VENTOLIN HFA) 108 (90 BASE) MCG/ACT inhaler Inhale 2 puffs into the lungs every 4 (four) hours as needed for wheezing or shortness of breath.    Marland Kitchen amitriptyline (ELAVIL) 10 MG tablet Take 10 mg by mouth at bedtime as needed for sleep.     . cephALEXin (KEFLEX) 500 MG capsule Take 1 capsule (500 mg total) by mouth 4 (four) times daily. 40 capsule 0  . cholecalciferol (VITAMIN D) 1000 units tablet Take 1,000 Units by mouth daily.    Marland Kitchen COUMADIN 10 MG tablet Take as directed by  the Coumadin clinic. 25 tablet 3  . COUMADIN 7.5 MG tablet Take as directed by Coumadin clinic. 10 tablet 3  . diazepam (VALIUM) 10 MG tablet Take 10 mg by mouth every 6 (six) hours.    . fluticasone (FLONASE) 50 MCG/ACT nasal spray Place 1 spray into both nostrils daily as needed for allergies.     . furosemide (LASIX) 80 MG tablet Take 1 tablet (80 mg total) by mouth 2 (two) times daily. 8 tablet 0  . hydrALAZINE (APRESOLINE) 25 MG tablet Take 25 mg by mouth 3 (three) times daily.    . Insulin Glargine (LANTUS SOLOSTAR) 100 UNIT/ML Solostar Pen Inject 30 Units into the skin daily at 10 pm. (Patient taking differently: Inject 30-32 Units into the skin daily at 10 pm. ) 15 mL 11  . levothyroxine (SYNTHROID) 88 MCG tablet Take 1 tablet (88 mcg total) by mouth daily before breakfast. Will send additional refills based off blood level 30 tablet 0  . metolazone (ZAROXOLYN) 2.5 MG tablet Take 2.5 mg by mouth daily.    . metoprolol (LOPRESSOR) 50 MG tablet  Take 1 tablet (50 mg total) by mouth 3 (three) times daily. 270 tablet 3  . Multiple Vitamin (MULTIVITAMIN) tablet Take 1 tablet by mouth every morning.     . nicotine (NICODERM CQ - DOSED IN MG/24 HOURS) 21 mg/24hr patch Place 1 patch (21 mg total) onto the skin daily. 28 patch 0  . omeprazole (PRILOSEC) 20 MG capsule Take 1 capsule (20 mg total) by mouth daily. Take with Harvoni at the same time under FASTING condition 90 capsule 11  . oxyCODONE (ROXICODONE) 15 MG immediate release tablet Take 30 mg by mouth every 8 (eight) hours. 2 tabs every 8 hours-scheduled    . psyllium (HYDROCIL/METAMUCIL) 95 % PACK Take 1 packet by mouth daily. 56 each 3  . simvastatin (ZOCOR) 20 MG tablet Take 1 tablet (20 mg total) by mouth daily at 6 PM. 90 tablet 5  . SPIRIVA HANDIHALER 18 MCG inhalation capsule INHALE CONTENTS OF ONE CAPSULE ONCE A DAY 30 capsule 11  . SURE COMFORT PEN NEEDLES 31G X 8 MM MISC USE ONE NIGHTLY 100 each 12     Allergies  Allergen  Reactions  . Novolog [Insulin Aspart] Other (See Comments)    UNSPECIFIED REACTION  "I'm just highly allergic"  . Hydrocodone-Acetaminophen Palpitations and Other (See Comments)    Doesn't want to take  . Iodides Other (See Comments)    Patient states he doesn't know what iodine is and doesn't think he is allergic to it despite it being listed with his allergies  . Other Hives and Other (See Comments)    Steroid creams   . Sulfacetamide Sodium Hives and Itching  . Tylenol [Acetaminophen] Nausea And Vomiting  . Gabapentin Swelling    SWELLING REACTION UNSPECIFIED   . Antihistamines, Chlorpheniramine-Type Other (See Comments)    unknown  . Iodinated Diagnostic Agents Other (See Comments)    Patient states he doesn't know what iodine is and doesn't think he is allergic to it despite it being listed with his allergies  . Lidocaine Itching and Other (See Comments)    Patient is uncertain of this allergy  . Pentazocine Other (See Comments)    unknown  . Pheniramine Other (See Comments)    unknown  . Sulfa Antibiotics Itching and Other (See Comments)  . Vicodin [Hydrocodone-Acetaminophen] Palpitations and Other (See Comments)    Doesn't want to take     Past Medical History:  Diagnosis Date  . Anxiety   . Asthma   . Bipolar 1 disorder ([place])   . Chronic pain   . CKD (chronic kidney disease), stage II   . COPD (chronic obstructive pulmonary disease) ([place])   . Diabetes mellitus   . Fibromyalgia   . GERD (gastroesophageal reflux disease)   . Hepatitis C   . Hepatitis C   . Hyperlipidemia   . Hypertension   . Hypothyroidism   . Melanoma ([place])    face, shoulder arm  . OA (osteoarthritis)   . PAF (paroxysmal atrial fibrillation) ([place])   . Pancreatitis, acute 2015  . RA (rheumatoid arthritis) ([place])   . Venous stasis     Past Surgical History:  Procedure Laterality Date  . AMPUTATION Left 11/11/2014   Procedure: FOOT, SECOND Torres AMPUTATION;  Surgeon: Newt Minion, MD;   Location: [place];  Service: Orthopedics;  Laterality: Left;  . CARDIOVERSION  02/03/2015  . CARDIOVERSION N/A 02/03/2015   Procedure: CARDIOVERSION;  Surgeon: Thayer Headings, MD;  Location: [place];  Service: Cardiovascular;  Laterality: N/A;  .  FRACTURE SURGERY Left    arm  . HERNIA REPAIR     umbicial  . INGUINAL HERNIA REPAIR Bilateral   . KNEE SURGERY     /H&P 02/20/2010  . SKIN CANCER EXCISION     melonoma  . SUBMANDIBULAR GLAND EXCISION Left 12/25/2013   w/sialolithotomy  . SUBMANDIBULAR GLAND EXCISION Left 12/25/2013   Procedure: SUBMANDIBULAR GLAND STONE AND CHRONIC SIALOLITHIASIS EXCISION;  Surgeon: Melida Quitter, MD;  Location: [place];  Service: ENT;  Laterality: Left;  . THYROIDECTOMY    . TONSILLECTOMY    . US ECHOCARDIOGRAPHY  02/19/2010   EF 55-60%    Family History  Problem Relation Age of Onset  . Hypertension Mother   . Alzheimer's disease Mother   . Heart disease Father     Social History:  reports that he has been smoking Cigarettes.  He has a 43.00 pack-year smoking history. He has never used smokeless tobacco. He reports that he drinks alcohol. He reports that he does not use drugs.   Review of Systems: Constitutional:  denies fever, chills, diaphoresis, appetite change and fatigue.  HEENT: denies photophobia, eye pain, redness, hearing loss, ear pain, congestion, sore throat, rhinorrhea, sneezing, neck pain, neck stiffness and tinnitus.  Respiratory: admits to SOB, DOE, cough, chest tightness, and wheezing.  Cardiovascular: denies chest pain, palpitations and leg swelling.  Gastrointestinal: denies nausea, vomiting, abdominal pain, diarrhea, constipation, blood in stool.  Genitourinary: denies dysuria, urgency, frequency, hematuria, flank pain and difficulty urinating.  Musculoskeletal: denies  myalgias, back pain, joint swelling, arthralgias and gait problem.   Skin: denies pallor, rash and wound.  Neurological: denies dizziness, seizures, syncope,  weakness, light-headedness, numbness and headaches.   Hematological: denies adenopathy, easy bruising, personal or family bleeding history.  Psychiatric/ Behavioral: denies suicidal ideation, mood changes, confusion, nervousness, sleep disturbance and agitation.    Physical Exam: BP 153/95   Pulse 78   Temp 98.1 F (36.7 C) (Oral)   Resp 20   Ht 6\' 10"  (2.083 m)   Wt 287 lb 12.8 oz (130.5 kg)   SpO2 99%   BMI 30.09 kg/m   Wt Readings from Last 3 Encounters:  03/26/16 287 lb 12.8 oz (130.5 kg)  03/04/16 258 lb (117 kg)  02/10/16 262 lb 6.4 oz (119 kg)    General: Vital signs reviewed and noted. Well-developed, well-nourished, in no acute distress; alert,   Head: Normocephalic, atraumatic, sclera anicteric,   Neck: Supple. Negative for carotid bruits. No JVD   Lungs:  Clear bilaterally, no  wheezes, rales, or rhonchi. Breathing is normal   Heart: Irreg. Irreg. with S1 S2. No murmurs, rubs, or gallops   Abdomen/ GI :  Soft, non-tender, non-distended with normoactive bowel sounds. No hepatomegaly. No rebound/guarding. No obvious abdominal masses   MSK: Strength and the appear normal for age.   Extremities: No clubbing or cyanosis. 1-2 + pitting edema.   Chronic stasis changes on both legs , right > left   Distal pedal pulses are 2+ and equal   Neurologic:  CN are grossly intact,  No obvious motor or sensory defect.  Alert and oriented X 3. Moves all extremities spontaneously.  Psych: Responds to questions appropriately with a normal affect.     Lab results: Basic Metabolic Panel:  Recent Labs Lab 03/26/16 1215  NA 132*  K 2.9*  CL 98*  CO2 16*  GLUCOSE 238*  BUN 161*  CREATININE 6.68*  CALCIUM 7.0*    Liver Function Tests:  Recent Labs Lab  03/26/16 1215  AST 29  ALT 21  ALKPHOS 42  BILITOT 1.1  PROT 6.8  ALBUMIN 3.4*   No results for input(s): LIPASE, AMYLASE in the last 168 hours. No results for input(s): AMMONIA in the last 168 hours.  CBC:  Recent  Labs Lab 03/26/16 1215  WBC 13.9*  NEUTROABS 12.1*  HGB 8.9*  HCT 27.2*  MCV 87.5  PLT 163    Cardiac Enzymes: No results for input(s): CKTOTAL, CKMB, CKMBINDEX, TROPONINI in the last 168 hours.  BNP: Invalid input(s): POCBNP  CBG: No results for input(s): GLUCAP in the last 168 hours.  Coagulation Studies:  Recent Labs  03/26/16 1215  LABPROT 62.2*  INR 6.94*     Other results: Personal review of EKG shows :  Atrial fib . No ST or T wave changes    Imaging: Dg Chest 2 View  Result Date: 03/26/2016 CLINICAL DATA:  Shortness of breath for 6 days. EXAM: CHEST  2 VIEW COMPARISON:  Chest x-rays dated 02/02/2016 and 06/24/2015. FINDINGS: There is stable mild cardiomegaly. Overall cardiomediastinal silhouette is stable in size and configuration. There is central pulmonary vascular congestion and prominent perihilar edema, left greater than right. Additional interstitial edema throughout both lungs. More confluent opacity at the left lung base could be associated alveolar pulmonary edema or superimposed pneumonia. Probable small left pleural effusion. IMPRESSION: 1. Cardiomegaly with pulmonary edema pattern consistent with CHF/volume overload. 2. A more confluent dense opacity at the left lung base (lingula) could be asymmetric alveolar pulmonary edema or superimposed pneumonia. In the absence of fever, favor asymmetric pulmonary edema. 3. Probable small left pleural effusion. Electronically Signed   By: Franki Cabot M.D.   On: 03/26/2016 12:44         Assessment & Plan:  1. Chronic CHF:   His EF was well preserved at the time of his last echocardiogram in 2016. We will need to repeat his echocardiogram.  I suspect a lot of his congestive heart failure symptoms are actually due to his acute on chronic renal insufficiency. His creatinine is now 6.68. It may be very difficult to diurese him.  His chest x-Teofilo shows marked volume overload/pulmonary edema and cardiomegaly. I  would suggest Lasix 120 mg IV twice a day.  2. Atrial fibrillation:  Continue Coumadin.  3. Stage V chronic kidney disease: He'll probably need a nephrology consult.  4. COPD: He continues to smoke. I have advised him to stop many times.  4. Diabetes mellitus: Further management per internal medicine team.   Ramond Dial., MD, [facility] 03/26/2016, 2:44 PM Office - [phone] Pager [phone]

## 2016-03-26 NOTE — ED Notes (Signed)
CRITICAL VALUE ALERT  Critical value received:  INR 6.94  Date of notification:  03-26-16  Time of notification:  1310  Critical value read back:Yes.    Nurse who received alert:  Maryfrances Bunnell RN  MD notified Dr. Audie Pinto

## 2016-03-26 NOTE — ED Notes (Signed)
Admitting MD in room.

## 2016-03-27 DIAGNOSIS — N185 Chronic kidney disease, stage 5: Secondary | ICD-10-CM

## 2016-03-27 LAB — GLUCOSE, CAPILLARY
GLUCOSE-CAPILLARY: 270 mg/dL — AB (ref 65–99)
GLUCOSE-CAPILLARY: 313 mg/dL — AB (ref 65–99)
Glucose-Capillary: 263 mg/dL — ABNORMAL HIGH (ref 65–99)
Glucose-Capillary: 305 mg/dL — ABNORMAL HIGH (ref 65–99)
Glucose-Capillary: 360 mg/dL — ABNORMAL HIGH (ref 65–99)

## 2016-03-27 LAB — BASIC METABOLIC PANEL
ANION GAP: 17 — AB (ref 5–15)
BUN: 162 mg/dL — AB (ref 6–20)
CALCIUM: 7.2 mg/dL — AB (ref 8.9–10.3)
CO2: 19 mmol/L — AB (ref 22–32)
Chloride: 101 mmol/L (ref 101–111)
Creatinine, Ser: 6.56 mg/dL — ABNORMAL HIGH (ref 0.61–1.24)
GFR calc Af Amer: 10 mL/min — ABNORMAL LOW (ref >60)
GFR, EST NON AFRICAN AMERICAN: 8 mL/min — AB (ref >60)
GLUCOSE: 213 mg/dL — AB (ref 65–99)
Potassium: 3.1 mmol/L — ABNORMAL LOW (ref 3.5–5.1)
Sodium: 137 mmol/L (ref 135–145)

## 2016-03-27 LAB — CBC
HEMATOCRIT: 27.4 % — AB (ref 39.0–52.0)
HEMOGLOBIN: 8.8 g/dL — AB (ref 13.0–17.0)
MCH: 28.6 pg (ref 26.0–34.0)
MCHC: 32.1 g/dL (ref 30.0–36.0)
MCV: 89 fL (ref 78.0–100.0)
Platelets: 152 10*3/uL (ref 150–400)
RBC: 3.08 MIL/uL — ABNORMAL LOW (ref 4.22–5.81)
RDW: 19.4 % — AB (ref 11.5–15.5)
WBC: 12.9 10*3/uL — ABNORMAL HIGH (ref 4.0–10.5)

## 2016-03-27 LAB — IRON AND TIBC
IRON: 32 ug/dL — AB (ref 45–182)
SATURATION RATIOS: 10 % — AB (ref 17.9–39.5)
TIBC: 332 ug/dL (ref 250–450)
UIBC: 300 ug/dL

## 2016-03-27 LAB — PROTIME-INR
INR: 8.2
PROTHROMBIN TIME: 71.1 s — AB (ref 11.4–15.2)

## 2016-03-27 LAB — URIC ACID: Uric Acid, Serum: 13.7 mg/dL — ABNORMAL HIGH (ref 4.4–7.6)

## 2016-03-27 MED ORDER — PHYTONADIONE 5 MG PO TABS
5.0000 mg | ORAL_TABLET | Freq: Once | ORAL | Status: AC
  Administered 2016-03-27: 5 mg via ORAL
  Filled 2016-03-27: qty 1

## 2016-03-27 MED ORDER — INFLUENZA VAC SPLIT QUAD 0.5 ML IM SUSY
0.5000 mL | PREFILLED_SYRINGE | INTRAMUSCULAR | Status: AC
  Administered 2016-03-28: 0.5 mL via INTRAMUSCULAR
  Filled 2016-03-27: qty 0.5

## 2016-03-27 MED ORDER — PNEUMOCOCCAL VAC POLYVALENT 25 MCG/0.5ML IJ INJ
0.5000 mL | INJECTION | INTRAMUSCULAR | Status: AC
  Administered 2016-03-28: 0.5 mL via INTRAMUSCULAR
  Filled 2016-03-27: qty 0.5

## 2016-03-27 MED ORDER — CALCIUM ACETATE (PHOS BINDER) 667 MG PO CAPS
667.0000 mg | ORAL_CAPSULE | Freq: Three times a day (TID) | ORAL | Status: DC
  Administered 2016-03-27 – 2016-03-28 (×3): 667 mg via ORAL
  Filled 2016-03-27 (×3): qty 1

## 2016-03-27 MED ORDER — FENTANYL 50 MCG/HR TD PT72
75.0000 ug | MEDICATED_PATCH | TRANSDERMAL | Status: DC
  Administered 2016-03-28 – 2016-03-31 (×3): 75 ug via TRANSDERMAL
  Filled 2016-03-27 (×2): qty 3
  Filled 2016-03-27: qty 1
  Filled 2016-03-27: qty 3

## 2016-03-27 MED ORDER — INSULIN GLARGINE 100 UNIT/ML ~~LOC~~ SOLN
30.0000 [IU] | Freq: Every day | SUBCUTANEOUS | Status: DC
  Administered 2016-03-27: 30 [IU] via SUBCUTANEOUS
  Filled 2016-03-27 (×2): qty 0.3

## 2016-03-27 MED ORDER — MORPHINE SULFATE (PF) 2 MG/ML IV SOLN
2.0000 mg | INTRAVENOUS | Status: AC | PRN
  Administered 2016-03-27 – 2016-03-28 (×2): 2 mg via INTRAVENOUS
  Filled 2016-03-27 (×2): qty 1

## 2016-03-27 MED ORDER — PHYTONADIONE 5 MG PO TABS
2.5000 mg | ORAL_TABLET | Freq: Once | ORAL | Status: AC
  Administered 2016-03-27: 2.5 mg via ORAL
  Filled 2016-03-27: qty 1

## 2016-03-27 MED ORDER — MORPHINE SULFATE (PF) 2 MG/ML IV SOLN
2.0000 mg | INTRAVENOUS | Status: DC | PRN
  Administered 2016-03-27: 2 mg via INTRAVENOUS
  Filled 2016-03-27: qty 1

## 2016-03-27 MED ORDER — RENA-VITE PO TABS
1.0000 | ORAL_TABLET | Freq: Every day | ORAL | Status: DC
  Administered 2016-03-27 – 2016-04-01 (×6): 1 via ORAL
  Filled 2016-03-27 (×6): qty 1

## 2016-03-27 MED ORDER — HEPARIN SODIUM (PORCINE) 5000 UNIT/ML IJ SOLN: 5000.0000 [IU] | Freq: Three times a day (TID) | INTRAMUSCULAR | Status: DC

## 2016-03-27 NOTE — Progress Notes (Signed)
PROGRESS NOTE  Subjective:   60 y.o. male with a PMHx of staqe V CKD, atrial fib. , who was admitted to [facility] on 03/26/2016 for evaluation of volume overload, worsening renal function  Has diuresed some .  Creatinine is stable Feeling better     Objective:    Vital Signs:   Temp:  [97.6 F (36.4 C)-98.1 F (36.7 C)] 97.6 F (36.4 C) (10/01 0826) Pulse Rate:  [71-99] 89 (10/01 0826) Resp:  [13-32] 13 (10/01 0432) BP: (148-173)/(90-127) 148/127 (10/01 0826) SpO2:  [95 %-100 %] 97 % (10/01 0830) Weight:  [286 lb 9.6 oz (130 kg)-287 lb 12.8 oz (130.5 kg)] 286 lb 9.6 oz (130 kg) (10/01 0500)      24-hour weight change: Weight change:   Weight trends: Filed Weights   03/26/16 1158 03/27/16 0500  Weight: 287 lb 12.8 oz (130.5 kg) 286 lb 9.6 oz (130 kg)    Intake/Output:  09/30 0701 - 10/01 0700 In: 2080 [P.O.:1440; I.V.:478; IV Piggyback:162] Out: 2700 [Urine:2700] Total I/O In: -  Out: 530 [Urine:530]   Physical Exam: BP (!) 148/127 (BP Location: Left Leg)   Pulse 89   Temp 97.6 F (36.4 C) (Oral)   Resp 13   Ht 6\' 10"  (2.083 m)   Wt 286 lb 9.6 oz (130 kg)   SpO2 97%   BMI 29.97 kg/m   Wt Readings from Last 3 Encounters:  03/27/16 286 lb 9.6 oz (130 kg)  03/04/16 258 lb (117 kg)  02/10/16 262 lb 6.4 oz (119 kg)    General: Vital signs reviewed and noted. Chronically ill appearing   Head: Normocephalic, atraumatic.  Eyes: conjunctivae/corneas clear.  EOM's intact.   Throat: normal  Neck:  normal   Lungs:    clear   Heart:  Irreg. Irreg.   Abdomen:  Soft, non-tender, obese   Extremities:  1 + pitting edem , chronic stasis changes.   Neurologic: A&O X3, CN II - XII are grossly intact.   Psych: Normal     Labs: BMET:  Recent Labs  03/26/16 1215 03/26/16 1626 03/27/16 0319  NA 132*  --  137  K 2.9*  --  3.1*  CL 98*  --  101  CO2 16*  --  19*  GLUCOSE 238*  --  213*  BUN 161*  --  162*  CREATININE 6.68*  --  6.56*  CALCIUM 7.0*   --  7.2*  MG  --  1.7  --     Liver function tests:  Recent Labs  03/26/16 1215  AST 29  ALT 21  ALKPHOS 42  BILITOT 1.1  PROT 6.8  ALBUMIN 3.4*   No results for input(s): LIPASE, AMYLASE in the last 72 hours.  CBC:  Recent Labs  03/26/16 1215 03/27/16 0319  WBC 13.9* 12.9*  NEUTROABS 12.1*  --   HGB 8.9* 8.8*  HCT 27.2* 27.4*  MCV 87.5 89.0  PLT 163 152    Cardiac Enzymes: No results for input(s): CKTOTAL, CKMB, TROPONINI in the last 72 hours.  Coagulation Studies:  Recent Labs  03/26/16 1215 03/27/16 0319  LABPROT 62.2* 71.1*  INR 6.94* 8.20*    Other: Invalid input(s): POCBNP No results for input(s): DDIMER in the last 72 hours. No results for input(s): HGBA1C in the last 72 hours. No results for input(s): CHOL, HDL, LDLCALC, TRIG, CHOLHDL in the last 72 hours. No results for input(s): TSH, T4TOTAL, T3FREE, THYROIDAB in the last 72 hours.  Invalid input(s): FREET3 No results for input(s): VITAMINB12, FOLATE, FERRITIN, TIBC, IRON, RETICCTPCT in the last 72 hours.   Other results:  EKG  ( personally reviewed )  -afib   Medications:    Infusions:    Scheduled Medications: . cholecalciferol  1,000 Units Oral Daily  . diazepam  10 mg Oral Q6H  . [START ON 03/28/2016] fentaNYL  75 mcg Transdermal Q72H  . furosemide  120 mg Intravenous Q12H  . hydrALAZINE  25 mg Oral TID  . [START ON 03/28/2016] Influenza vac split quadrivalent PF  0.5 mL Intramuscular Tomorrow-1000  . insulin glargine  25 Units Subcutaneous Q2200  . ipratropium-albuterol  3 mL Nebulization Q6H  . [START ON 03/28/2016] levofloxacin (LEVAQUIN) IV  500 mg Intravenous Q48H  . levothyroxine  88 mcg Oral QAC breakfast  . metoprolol  100 mg Oral q morning - 10a  . metoprolol tartrate  50 mg Oral QPM  . nicotine  14 mg Transdermal Daily  . nitroGLYCERIN  0.5 inch Topical Q6H  . [START ON 03/28/2016] pneumococcal 23 valent vaccine  0.5 mL Intramuscular Tomorrow-1000  . potassium  chloride  40 mEq Oral BID  . predniSONE  40 mg Oral Q breakfast  . simvastatin  20 mg Oral q1800  . sodium chloride flush  3 mL Intravenous Q12H  . Warfarin - Pharmacist Dosing Inpatient   Does not apply q1800    Assessment/ Plan:   Principal Problem:   Acute on chronic diastolic CHF (congestive heart failure), NYHA class 3 (HCC) Active Problems:   Atrial fibrillation status post cardioversion (HCC)   Hyperlipidemia   Essential hypertension   COPD exacerbation (HCC)   GERD (gastroesophageal reflux disease)   Type 2 diabetes mellitus with diabetic polyneuropathy, with long-term current use of insulin (HCC)   CKD (chronic kidney disease), stage V (HCC)   Hypokalemia  1. Acute on chronic CHF - assume diastolic dysfunction . Systolic function has been well preserved .   Repeat echo to be done today or tomrrow.  Continue IV lasix - potassium is being supplemented by IM.   2. Atrial fib:   Rate is well controlled.   3. CKD - renal function is stable, further plans per nephrology   Disposition:  Length of Stay: 1  Thayer Headings, Brooke Bonito., MD, [facility] 03/27/2016, 10:39 AM Office [phone] Pager [phone]

## 2016-03-27 NOTE — Progress Notes (Signed)
ANTICOAGULATION CONSULT NOTE - Follow Up Consult  Pharmacy Consult for warfarin Indication: atrial fibrillation  Allergies  Allergen Reactions  . Novolog [Insulin Aspart] Other (See Comments)    UNSPECIFIED REACTION  "I'm just highly allergic"  . Hydrocodone-Acetaminophen Palpitations and Other (See Comments)    Doesn't want to take  . Iodides Other (See Comments)    Patient states he doesn't know what iodine is and doesn't think he is allergic to it despite it being listed with his allergies  . Other Hives and Other (See Comments)    Steroid creams "Corndogs"- Nausea & vomiting   . Sulfacetamide Sodium Hives and Itching  . Tylenol [Acetaminophen] Nausea And Vomiting  . Gabapentin Swelling    SWELLING REACTION UNSPECIFIED   . Oxycodone-Acetaminophen Nausea And Vomiting  . Antihistamines, Chlorpheniramine-Type Other (See Comments)    unknown  . Hydralazine Rash  . Iodinated Diagnostic Agents Other (See Comments)    Patient states he doesn't know what iodine is and doesn't think he is allergic to it despite it being listed with his allergies  . Latex Rash  . Lidocaine Itching and Other (See Comments)    Patient is uncertain of this allergy  . Pentazocine Other (See Comments)    unknown  . Pheniramine Other (See Comments)    unknown  . Sulfa Antibiotics Itching and Other (See Comments)  . Tape Rash  . Vicodin [Hydrocodone-Acetaminophen] Palpitations and Other (See Comments)    Doesn't want to take    Patient Measurements: Height: 6\' 10"  (208.3 cm) Weight: 286 lb 9.6 oz (130 kg) IBW/kg (Calculated) : 100.6   Vital Signs: Temp: 97.6 F (36.4 C) (10/01 0826) Temp Source: Oral (10/01 0826) BP: 148/127 (10/01 0826) Pulse Rate: 89 (10/01 0826)  Labs:  Recent Labs  03/26/16 1215 03/27/16 0319  HGB 8.9* 8.8*  HCT 27.2* 27.4*  PLT 163 152  LABPROT 62.2* 71.1*  INR 6.94* 8.20*  CREATININE 6.68* 6.56*    Estimated Creatinine Clearance: 19 mL/min (by C-G formula  based on SCr of 6.56 mg/dL (H)).  Assessment: Pharmacy consulted for warfarin dosing. Patient admitted with elevated INR and this continues to rise at 8.2 this am. Patient was given Vitamin K 2.5 po x1 today. Holding warfarin for now.   Goal of Therapy:  INR 2-3 Monitor platelets by anticoagulation protocol: Yes   Plan:  Continue to hold warfarin.  Daily PT/INR  Sloan Leiter, PharmD, BCPS Clinical Pharmacist [phone] 03/27/2016,11:12 AM

## 2016-03-27 NOTE — ED Provider Notes (Signed)
[facility] DEPT Provider Note   CSN: [id number] Arrival date & time: 03/26/16  1139     History   Chief Complaint Chief Complaint  Patient presents with  . Shortness of Breath    HPI Johnny Sims is a 60 y.o. male.  HPI Patient presents emergency room via EMS.  Has had 5 days of productive cough with wheezing and shortness of breath.  Also has had increase fluid retention and home.  Try and uses inhalers but they don't seem to be helping.  Has history of congestive heart failure and chronic kidney disease.  Denies chest pain.  Denies fever or chills. Past Medical History:  Diagnosis Date  . Anxiety   . Asthma   . Bipolar 1 disorder ([place])   . Chronic pain   . CKD (chronic kidney disease), stage II   . COPD (chronic obstructive pulmonary disease) ([place])   . Diabetes mellitus   . Fibromyalgia   . GERD (gastroesophageal reflux disease)   . Hepatitis C   . Hepatitis C   . Hyperlipidemia   . Hypertension   . Hypothyroidism   . Melanoma ([place])    face, shoulder arm  . OA (osteoarthritis)   . PAF (paroxysmal atrial fibrillation) ([place])   . Pancreatitis, acute 2015  . RA (rheumatoid arthritis) ([place])   . Venous stasis     Patient Active Problem List   Diagnosis Date Noted  . CKD (chronic kidney disease), stage V ([place]) 03/26/2016  . Hypokalemia 03/26/2016  . Encounter for therapeutic drug monitoring 09/07/2015  . Pyrexia   . Upper respiratory tract infection   . Type 2 diabetes mellitus with diabetic polyneuropathy, with long-term current use of insulin ([place])   . Thyroid activity decreased   . Atrial fibrillation status post cardioversion ([place]) 02/03/2015  . Acute on chronic diastolic CHF (congestive heart failure), NYHA class 3 ([place])   . GERD (gastroesophageal reflux disease) 11/10/2014  . Tobacco use disorder 08/28/2014  . Hyperlipidemia 07/16/2012  . Essential hypertension 07/16/2012  . COPD exacerbation ([place]) 07/16/2012    Past Surgical History:  Procedure  Laterality Date  . AMPUTATION Left 11/11/2014   Procedure: FOOT, SECOND [place] AMPUTATION;  Surgeon: Newt Minion, MD;  Location: [facility];  Service: Orthopedics;  Laterality: Left;  . CARDIOVERSION  02/03/2015  . CARDIOVERSION N/A 02/03/2015   Procedure: CARDIOVERSION;  Surgeon: Thayer Headings, MD;  Location: [facility];  Service: Cardiovascular;  Laterality: N/A;  . FRACTURE SURGERY Left    arm  . HERNIA REPAIR     umbicial  . INGUINAL HERNIA REPAIR Bilateral   . KNEE SURGERY     /H&P 02/20/2010  . SKIN CANCER EXCISION     melonoma  . SUBMANDIBULAR GLAND EXCISION Left 12/25/2013   w/sialolithotomy  . SUBMANDIBULAR GLAND EXCISION Left 12/25/2013   Procedure: SUBMANDIBULAR GLAND STONE AND CHRONIC SIALOLITHIASIS EXCISION;  Surgeon: Melida Quitter, MD;  Location: [facility];  Service: ENT;  Laterality: Left;  . THYROIDECTOMY    . TONSILLECTOMY    . US ECHOCARDIOGRAPHY  02/19/2010   EF 55-60%       Home Medications    Prior to Admission medications   Medication Sig Start Date End Date Taking? Authorizing Provider  albuterol (PROVENTIL HFA;VENTOLIN HFA) 108 (90 BASE) MCG/ACT inhaler Inhale 2 puffs into the lungs every 4 (four) hours as needed for wheezing or shortness of breath.   Yes Historical Provider, MD  albuterol (PROVENTIL) (2.5 MG/3ML) 0.083% nebulizer solution Take 2.5 mg by  nebulization 4 (four) times daily.   Yes Historical Provider, MD  cephALEXin (KEFLEX) 500 MG capsule Take 1 capsule (500 mg total) by mouth 4 (four) times daily. 03/05/16  Yes Deno Etienne, DO  cholecalciferol (VITAMIN D) 1000 units tablet Take 1,000 Units by mouth daily.   Yes Historical Provider, MD  COUMADIN 10 MG tablet Take as directed by the Coumadin clinic. Patient taking differently: Take 10 mg by mouth See admin instructions. Mon/Tues/Wed/Fri/Sat (in the evening) 01/19/16  Yes Thayer Headings, MD  COUMADIN 7.5 MG tablet Take as directed by Coumadin clinic. Patient taking differently: Take 7.5 mg by mouth. Sun/Thurs  (in the evening) 01/19/16  Yes Thayer Headings, MD  diazepam (VALIUM) 10 MG tablet Take 10 mg by mouth every 4 (four) hours as needed for anxiety.  08/24/15  Yes Historical Provider, MD  fentaNYL (DURAGESIC - DOSED MCG/HR) 75 MCG/HR Place 75 mcg onto the skin every 3 (three) days.   Yes Historical Provider, MD  furosemide (LASIX) 80 MG tablet Take 1 tablet (80 mg total) by mouth 2 (two) times daily. Patient taking differently: Take 80-160 mg by mouth See admin instructions. 80 mg once a day and and an additional 80 mg in the evening every other day 10/08/15 03/26/16 Yes Merrily Pew, MD  Insulin Glargine (LANTUS SOLOSTAR) 100 UNIT/ML Solostar Pen Inject 30 Units into the skin daily at 10 pm. 06/25/15  Yes Loleta Chance, MD  levothyroxine (SYNTHROID) 88 MCG tablet Take 1 tablet (88 mcg total) by mouth daily before breakfast. Will send additional refills based off blood level Patient taking differently: Take 88 mcg by mouth daily before breakfast.  03/06/15  Yes Lance Bosch, NP  metolazone (ZAROXOLYN) 2.5 MG tablet Take 2.5 mg by mouth daily. 02/03/16  Yes Historical Provider, MD  metoprolol (LOPRESSOR) 50 MG tablet Take 1 tablet (50 mg total) by mouth 3 (three) times daily. 03/26/15  Yes Thayer Headings, MD  Multiple Vitamin (MULTIVITAMIN) tablet Take 1 tablet by mouth every morning.    Yes Historical Provider, MD  nicotine (NICODERM CQ - DOSED IN MG/24 HOURS) 14 mg/24hr patch Place 14 mg onto the skin daily.   Yes Historical Provider, MD  omeprazole (PRILOSEC) 20 MG capsule Take 1 capsule (20 mg total) by mouth daily. Take with [place] at the same time under FASTING condition Patient taking differently: Take 20 mg by mouth daily.  03/06/15  Yes Lance Bosch, NP  simvastatin (ZOCOR) 20 MG tablet Take 1 tablet (20 mg total) by mouth daily at 6 PM. Patient taking differently: Take 10 mg by mouth daily at 6 PM.  03/06/15  Yes Lance Bosch, NP  SPIRIVA HANDIHALER 18 MCG inhalation capsule INHALE CONTENTS OF ONE  CAPSULE ONCE A DAY 04/01/15  Yes Thayer Headings, MD  SURE COMFORT PEN NEEDLES 31G X 8 MM MISC USE ONE NIGHTLY 09/07/15  Yes Lance Bosch, NP  nicotine (NICODERM CQ - DOSED IN MG/24 HOURS) 21 mg/24hr patch Place 1 patch (21 mg total) onto the skin daily. Patient not taking: Reported on 03/26/2016 06/25/15   Loleta Chance, MD  psyllium (HYDROCIL/METAMUCIL) 95 % PACK Take 1 packet by mouth daily. Patient not taking: Reported on 03/26/2016 06/25/15   Loleta Chance, MD    Family History Family History  Problem Relation Age of Onset  . Hypertension Mother   . Alzheimer's disease Mother   . Heart disease Father     Social History Social History  Substance Use Topics  .  Smoking status: Current Every Day Smoker    Packs/day: 1.00    Years: 43.00    Types: Cigarettes  . Smokeless tobacco: Never Used  . Alcohol use Yes     Comment: rare     Allergies   Novolog [insulin aspart]; Hydrocodone-acetaminophen; Iodides; Other; Sulfacetamide sodium; Tylenol [acetaminophen]; Gabapentin; Oxycodone-acetaminophen; Antihistamines, chlorpheniramine-type; Hydralazine; Iodinated diagnostic agents; Latex; Lidocaine; Pentazocine; Pheniramine; Sulfa antibiotics; Tape; and Vicodin [hydrocodone-acetaminophen]   Review of Systems Review of Systems All other systems reviewed and are negative  Physical Exam Updated Vital Signs BP (!) 148/127 (BP Location: Left Leg)   Pulse 89   Temp 97.6 F (36.4 C) (Oral)   Resp 13   Ht 6\' 10"  (2.083 m)   Wt 286 lb 9.6 oz (130 kg)   SpO2 97%   BMI 29.97 kg/m   Physical Exam Physical Exam  Nursing note and vitals reviewed. Constitutional: He is oriented to person, place, and time. He appears well-developed and well-nourished.  Appears short of breath with mild distress.Marland Kitchen  HENT:  Head: Normocephalic and atraumatic.  Eyes: Pupils are equal, round, and reactive to light.  Neck: Normal range of motion.  Cardiovascular: Irregular rhythm..   Pulmonary/Chest: Tachypnea  with mild and her use of intercostal muscles.  Rales or rhonchi noted to auscultation posteriorly in all lung fields.   Abdominal: Normal appearance. He exhibits no distension.  Musculoskeletal: Normal range of motion.  Neurological: He is alert and oriented to person, place, and time. No cranial nerve deficit.  Skin: Skin is warm and dry. No rash noted.  Psychiatric: He has a normal mood and affect. His behavior is normal.    ED Treatments / Results  Labs (all labs ordered are listed, but only abnormal results are displayed) Labs Reviewed  COMPREHENSIVE METABOLIC PANEL - Abnormal; Notable for the following:       Result Value   Sodium 132 (*)    Potassium 2.9 (*)    Chloride 98 (*)    CO2 16 (*)    Glucose, Bld 238 (*)    BUN 161 (*)    Creatinine, Ser 6.68 (*)    Calcium 7.0 (*)    Albumin 3.4 (*)    GFR calc non Af Amer 8 (*)    GFR calc Af Amer 9 (*)    Anion gap 18 (*)    All other components within normal limits  CBC WITH DIFFERENTIAL/PLATELET - Abnormal; Notable for the following:    WBC 13.9 (*)    RBC 3.11 (*)    Hemoglobin 8.9 (*)    HCT 27.2 (*)    RDW 19.3 (*)    Neutro Abs 12.1 (*)    All other components within normal limits  BRAIN NATRIURETIC PEPTIDE - Abnormal; Notable for the following:    B Natriuretic Peptide 894.3 (*)    All other components within normal limits  PROTIME-INR - Abnormal; Notable for the following:    Prothrombin Time 62.2 (*)    INR 6.94 (*)    All other components within normal limits  BASIC METABOLIC PANEL - Abnormal; Notable for the following:    Potassium 3.1 (*)    CO2 19 (*)    Glucose, Bld 213 (*)    BUN 162 (*)    Creatinine, Ser 6.56 (*)    Calcium 7.2 (*)    GFR calc non Af Amer 8 (*)    GFR calc Af Amer 10 (*)    Anion gap 17 (*)  All other components within normal limits  CBC - Abnormal; Notable for the following:    WBC 12.9 (*)    RBC 3.08 (*)    Hemoglobin 8.8 (*)    HCT 27.4 (*)    RDW 19.4 (*)    All  other components within normal limits  PROTIME-INR - Abnormal; Notable for the following:    Prothrombin Time 71.1 (*)    INR 8.20 (*)    All other components within normal limits  GLUCOSE, CAPILLARY - Abnormal; Notable for the following:    Glucose-Capillary 313 (*)    All other components within normal limits  GLUCOSE, CAPILLARY - Abnormal; Notable for the following:    Glucose-Capillary 256 (*)    All other components within normal limits  GLUCOSE, CAPILLARY - Abnormal; Notable for the following:    Glucose-Capillary 263 (*)    All other components within normal limits  GLUCOSE, CAPILLARY - Abnormal; Notable for the following:    Glucose-Capillary 270 (*)    All other components within normal limits  MRSA PCR SCREENING  MAGNESIUM  ERYTHROPOIETIN  I-STAT TROPOININ, ED    EKG  EKG Interpretation None       Radiology Dg Chest 2 View  Result Date: 03/26/2016 CLINICAL DATA:  Shortness of breath for 6 days. EXAM: CHEST  2 VIEW COMPARISON:  Chest x-rays dated 02/02/2016 and 06/24/2015. FINDINGS: There is stable mild cardiomegaly. Overall cardiomediastinal silhouette is stable in size and configuration. There is central pulmonary vascular congestion and prominent perihilar edema, left greater than right. Additional interstitial edema throughout both lungs. More confluent opacity at the left lung base could be associated alveolar pulmonary edema or superimposed pneumonia. Probable small left pleural effusion. IMPRESSION: 1. Cardiomegaly with pulmonary edema pattern consistent with CHF/volume overload. 2. A more confluent dense opacity at the left lung base (lingula) could be asymmetric alveolar pulmonary edema or superimposed pneumonia. In the absence of fever, favor asymmetric pulmonary edema. 3. Probable small left pleural effusion. Electronically Signed   By: Franki Cabot M.D.   On: 03/26/2016 12:44    Procedures Procedures (including critical care time)  Medications Ordered in  ED Medications  nitroGLYCERIN (NITROSTAT) SL tablet 0.4 mg (not administered)  nitroGLYCERIN (NITROGLYN) 2 % ointment 0.5 inch (0.5 inches Topical Given 03/27/16 0500)  ondansetron (ZOFRAN) injection 4 mg (not administered)  predniSONE (DELTASONE) tablet 40 mg (40 mg Oral Given 03/27/16 0849)  ipratropium-albuterol (DUONEB) 0.5-2.5 (3) MG/3ML nebulizer solution 3 mL (3 mLs Nebulization Given 03/27/16 0829)  furosemide (LASIX) 120 mg in dextrose 5 % 50 mL IVPB (120 mg Intravenous Given 03/27/16 0846)  potassium chloride SA (K-DUR,KLOR-CON) CR tablet 40 mEq (40 mEq Oral Given 03/26/16 2122)  hydrALAZINE (APRESOLINE) tablet 25 mg (25 mg Oral Given 03/26/16 2122)  diazepam (VALIUM) tablet 10 mg (10 mg Oral Given 03/27/16 0451)  cholecalciferol (VITAMIN D) tablet 1,000 Units (1,000 Units Oral Given 03/26/16 1726)  insulin glargine (LANTUS) injection 25 Units (25 Units Subcutaneous Given 03/26/16 2122)  nicotine (NICODERM CQ - dosed in mg/24 hours) patch 14 mg (14 mg Transdermal Patch Applied 03/26/16 1726)  metoprolol (LOPRESSOR) tablet 100 mg (not administered)  levothyroxine (SYNTHROID, LEVOTHROID) tablet 88 mcg (88 mcg Oral Given 03/27/16 0520)  simvastatin (ZOCOR) tablet 20 mg (20 mg Oral Given 03/26/16 1712)  fluticasone (FLONASE) 50 MCG/ACT nasal spray 1 spray (not administered)  sodium chloride flush (NS) 0.9 % injection 3 mL (3 mLs Intravenous Given 03/26/16 2123)  albuterol (PROVENTIL) (2.5 MG/3ML) 0.083% nebulizer solution  2.5 mg (not administered)  levofloxacin (LEVAQUIN) IVPB 500 mg (not administered)  metoprolol (LOPRESSOR) tablet 50 mg (50 mg Oral Given 03/26/16 1726)  Warfarin - Pharmacist Dosing Inpatient (not administered)  Influenza vac split quadrivalent PF (FLUARIX) injection 0.5 mL (not administered)  pneumococcal 23 valent vaccine (PNU-IMMUNE) injection 0.5 mL (not administered)  morphine 2 MG/ML injection 2 mg (2 mg Intravenous Given 03/27/16 0521)  fentaNYL (SUBLIMAZE) injection 25 mcg  (25 mcg Intravenous Given 03/26/16 1212)  magnesium sulfate IVPB 1 g 100 mL (0 g Intravenous Stopped 03/26/16 1527)  furosemide (LASIX) injection 80 mg (80 mg Intravenous Given 03/26/16 1420)  morphine 2 MG/ML injection 1 mg (1 mg Intravenous Given 03/26/16 1533)  levofloxacin (LEVAQUIN) IVPB 750 mg (750 mg Intravenous Given 03/26/16 1711)  phytonadione (VITAMIN K) tablet 2.5 mg (2.5 mg Oral Given 03/27/16 0520)     Initial Impression / Assessment and Plan / ED Course  I have reviewed the triage vital signs and the nursing notes.  Pertinent labs & imaging results that were available during my care of the patient were reviewed by me and considered in my medical decision making (see chart for details).  Clinical Course    Cardiology was consulted and saw the patient emergency room.  Patient was admitted to stepdown.  His renal function has deteriorated over the last month and he is in heart failure.  Final Clinical Impressions(s) / ED Diagnoses   Final diagnoses:  Congestive heart failure, unspecified congestive heart failure chronicity, unspecified congestive heart failure type [facility])  Renal failure    New Prescriptions Current Discharge Medication List       Leonard Schwartz, MD 03/27/16 1002

## 2016-03-27 NOTE — Consult Note (Signed)
Reason for Consult:CKD5, fluid overload Referring Physician: Dr. Kavin Leech Johnny Sims is an 60 y.o. male.  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  Hx Asthma, OA, ?RA, hypothyroid, ^ lipids, bipolar, chronic pain, COPD still smoking 1/2 pk/d, HTN, Melanoma.  I saw in 5/16 Cr in 2-4 range, with vol xs.  He did not f/u.  Subsequent hosp 12/16 and saw Dr.Sanford, and f/u in 1/7 in office but multiple no shows after and did not go for access as set up.  Now with fluid xs and Cr 6.5/BUN of 162.  Has bicarb of 19.  Hx progressive swelling over several weeks to months.  Also admits cramping,itching and occ V.   Constitutional: SOB, weak Eyes: no ey f/u, some vision prob Ears, nose, mouth, throat, and face: negative Respiratory: SOB some cough, no phlegm Cardiovascular: as above, LE edema also Gastrointestinal: occ V Genitourinary:negative Integument/breast: itching, changes on legs Hematologic/lymphatic: bruising Musculoskeletal:diffuse joint aches Behavioral/Psych: hx BIPOLAR Endocrine: DM, hypothyroid Allergic/Immunologic: suilfa, lido, latex, hydral, antihist, oxycodone, gaba, ?Electronics engineer. .   Past Medical History:  Diagnosis Date  . Anxiety   . Asthma   . Bipolar 1 disorder ([place])   . Chronic pain   . CKD (chronic kidney disease), stage II   . COPD (chronic obstructive pulmonary disease) ([place])   . Diabetes mellitus   . Fibromyalgia   . GERD (gastroesophageal reflux disease)   . Hepatitis C   . Hepatitis C   . Hyperlipidemia   . Hypertension   . Hypothyroidism   . Melanoma ([place])    face, shoulder arm  . OA (osteoarthritis)   . PAF (paroxysmal atrial fibrillation) ([place])   . Pancreatitis, acute 2015  . RA (rheumatoid arthritis) ([place])   . Venous stasis     Past Surgical History:  Procedure Laterality Date  . AMPUTATION Left 11/11/2014   Procedure: FOOT, SECOND Razi AMPUTATION;  Surgeon: Newt Minion, MD;  Location:  [facility];  Service: Orthopedics;  Laterality: Left;  . CARDIOVERSION  02/03/2015  . CARDIOVERSION N/A 02/03/2015   Procedure: CARDIOVERSION;  Surgeon: Thayer Headings, MD;  Location: [facility];  Service: Cardiovascular;  Laterality: N/A;  . FRACTURE SURGERY Left    arm  . HERNIA REPAIR     umbicial  . INGUINAL HERNIA REPAIR Bilateral   . KNEE SURGERY     /H&P 02/20/2010  . SKIN CANCER EXCISION     melonoma  . SUBMANDIBULAR GLAND EXCISION Left 12/25/2013   w/sialolithotomy  . SUBMANDIBULAR GLAND EXCISION Left 12/25/2013   Procedure: SUBMANDIBULAR GLAND STONE AND CHRONIC SIALOLITHIASIS EXCISION;  Surgeon: Melida Quitter, MD;  Location: [facility];  Service: ENT;  Laterality: Left;  . THYROIDECTOMY    . TONSILLECTOMY    . US ECHOCARDIOGRAPHY  02/19/2010   EF 55-60%    Family History  Problem Relation Age of Onset  . Hypertension Mother   . Alzheimer's disease Mother   . Heart disease Father     Social History:  reports that he has been smoking Cigarettes.  He has a 43.00 pack-year smoking history. He has never used smokeless tobacco. He reports that he drinks alcohol. He reports that he does not use drugs.  Allergies:  Allergies  Allergen Reactions  . Novolog [Insulin Aspart] Other (See Comments)    UNSPECIFIED REACTION  "I'm just highly allergic"  . Hydrocodone-Acetaminophen Palpitations and Other (See Comments)    Doesn't want  to take  . Iodides Other (See Comments)    Patient states he doesn't know what iodine is and doesn't think he is allergic to it despite it being listed with his allergies  . Other Hives and Other (See Comments)    Steroid creams "Corndogs"- Nausea & vomiting   . Sulfacetamide Sodium Hives and Itching  . Tylenol [Acetaminophen] Nausea And Vomiting  . Gabapentin Swelling    SWELLING REACTION UNSPECIFIED   . Oxycodone-Acetaminophen Nausea And Vomiting  . Antihistamines, Chlorpheniramine-Type Other (See Comments)    unknown  . Hydralazine Rash  . Iodinated  Diagnostic Agents Other (See Comments)    Patient states he doesn't know what iodine is and doesn't think he is allergic to it despite it being listed with his allergies  . Latex Rash  . Lidocaine Itching and Other (See Comments)    Patient is uncertain of this allergy  . Pentazocine Other (See Comments)    unknown  . Pheniramine Other (See Comments)    unknown  . Sulfa Antibiotics Itching and Other (See Comments)  . Tape Rash  . Vicodin [Hydrocodone-Acetaminophen] Palpitations and Other (See Comments)    Doesn't want to take    Medications:  I have reviewed the patient's current medications. Prior to Admission:  Prescriptions Prior to Admission  Medication Sig Dispense Refill Last Dose  . albuterol (PROVENTIL HFA;VENTOLIN HFA) 108 (90 BASE) MCG/ACT inhaler Inhale 2 puffs into the lungs every 4 (four) hours as needed for wheezing or shortness of breath.   03/26/2016 at am  . albuterol (PROVENTIL) (2.5 MG/3ML) 0.083% nebulizer solution Take 2.5 mg by nebulization 4 (four) times daily.   03/26/2016 at am  . cephALEXin (KEFLEX) 500 MG capsule Take 1 capsule (500 mg total) by mouth 4 (four) times daily. 40 capsule 0 03/24/2016 at pm  . cholecalciferol (VITAMIN D) 1000 units tablet Take 1,000 Units by mouth daily.   03/26/2016 at am  . COUMADIN 10 MG tablet Take as directed by the Coumadin clinic. (Patient taking differently: Take 10 mg by mouth See admin instructions. Mon/Tues/Wed/Fri/Sat (in the evening)) 25 tablet 3 03/25/2016 at pm  . COUMADIN 7.5 MG tablet Take as directed by Coumadin clinic. (Patient taking differently: Take 7.5 mg by mouth. Sun/Thurs (in the evening)) 10 tablet 3 03/24/2016 at pm  . diazepam (VALIUM) 10 MG tablet Take 10 mg by mouth every 4 (four) hours as needed for anxiety.    03/26/2016 at am  . fentaNYL (DURAGESIC - DOSED MCG/HR) 75 MCG/HR Place 75 mcg onto the skin every 3 (three) days.   03/26/2016 at am  . furosemide (LASIX) 80 MG tablet Take 1 tablet (80 mg total) by  mouth 2 (two) times daily. (Patient taking differently: Take 80-160 mg by mouth See admin instructions. 80 mg once a day and and an additional 80 mg in the evening every other day) 8 tablet 0 03/26/2016 at am  . Insulin Glargine (LANTUS SOLOSTAR) 100 UNIT/ML Solostar Pen Inject 30 Units into the skin daily at 10 pm. 15 mL 11 03/25/2016 at pm  . levothyroxine (SYNTHROID) 88 MCG tablet Take 1 tablet (88 mcg total) by mouth daily before breakfast. Will send additional refills based off blood level (Patient taking differently: Take 88 mcg by mouth daily before breakfast. ) 30 tablet 0 03/26/2016 at am  . metolazone (ZAROXOLYN) 2.5 MG tablet Take 2.5 mg by mouth daily.   03/26/2016 at am  . metoprolol (LOPRESSOR) 50 MG tablet Take 1 tablet (50 mg total)  by mouth 3 (three) times daily. 270 tablet 3 03/26/2016 at 0800  . Multiple Vitamin (MULTIVITAMIN) tablet Take 1 tablet by mouth every morning.    03/26/2016 at 0800  . nicotine (NICODERM CQ - DOSED IN MG/24 HOURS) 14 mg/24hr patch Place 14 mg onto the skin daily.   03/26/2016 at am  . omeprazole (PRILOSEC) 20 MG capsule Take 1 capsule (20 mg total) by mouth daily. Take with Harvoni at the same time under FASTING condition (Patient taking differently: Take 20 mg by mouth daily. ) 90 capsule 11 03/26/2016 at am  . simvastatin (ZOCOR) 20 MG tablet Take 1 tablet (20 mg total) by mouth daily at 6 PM. (Patient taking differently: Take 10 mg by mouth daily at 6 PM. ) 90 tablet 5 03/25/2016 at pm  . SPIRIVA HANDIHALER 18 MCG inhalation capsule INHALE CONTENTS OF ONE CAPSULE ONCE A DAY 30 capsule 11 03/25/2016 at am  . SURE COMFORT PEN NEEDLES 31G X 8 MM MISC USE ONE NIGHTLY 100 each 12 Taking  . nicotine (NICODERM CQ - DOSED IN MG/24 HOURS) 21 mg/24hr patch Place 1 patch (21 mg total) onto the skin daily. (Patient not taking: Reported on 03/26/2016) 28 patch 0 Not Taking at Unknown time  . psyllium (HYDROCIL/METAMUCIL) 95 % PACK Take 1 packet by mouth daily. (Patient not  taking: Reported on 03/26/2016) 56 each 3 Not Taking at Unknown time    Results for orders placed or performed during the hospital encounter of 03/26/16 (from the past 48 hour(s))  Comprehensive metabolic panel     Status: Abnormal   Collection Time: 03/26/16 12:15 PM  Result Value Ref Range   Sodium 132 (L) 135 - 145 mmol/L   Potassium 2.9 (L) 3.5 - 5.1 mmol/L   Chloride 98 (L) 101 - 111 mmol/L   CO2 16 (L) 22 - 32 mmol/L   Glucose, Bld 238 (H) 65 - 99 mg/dL   BUN 161 (H) 6 - 20 mg/dL   Creatinine, Ser 6.68 (H) 0.61 - 1.24 mg/dL   Calcium 7.0 (L) 8.9 - 10.3 mg/dL   Total Protein 6.8 6.5 - 8.1 g/dL   Albumin 3.4 (L) 3.5 - 5.0 g/dL   AST 29 15 - 41 U/L   ALT 21 17 - 63 U/L   Alkaline Phosphatase 42 38 - 126 U/L   Total Bilirubin 1.1 0.3 - 1.2 mg/dL   GFR calc non Af Amer 8 (L) >60 mL/min   GFR calc Af Amer 9 (L) >60 mL/min    Comment: (NOTE) The eGFR has been calculated using the CKD EPI equation. This calculation has not been validated in all clinical situations. eGFR's persistently <60 mL/min signify possible Chronic Kidney Disease.    Anion gap 18 (H) 5 - 15  CBC with Differential/Platelet     Status: Abnormal   Collection Time: 03/26/16 12:15 PM  Result Value Ref Range   WBC 13.9 (H) 4.0 - 10.5 K/uL   RBC 3.11 (L) 4.22 - 5.81 MIL/uL   Hemoglobin 8.9 (L) 13.0 - 17.0 g/dL   HCT 27.2 (L) 39.0 - 52.0 %   MCV 87.5 78.0 - 100.0 fL   MCH 28.6 26.0 - 34.0 pg   MCHC 32.7 30.0 - 36.0 g/dL   RDW 19.3 (H) 11.5 - 15.5 %   Platelets 163 150 - 400 K/uL   Neutrophils Relative % 87 %   Neutro Abs 12.1 (H) 1.7 - 7.7 K/uL   Lymphocytes Relative 8 %   Lymphs Abs  1.2 0.7 - 4.0 K/uL   Monocytes Relative 5 %   Monocytes Absolute 0.7 0.1 - 1.0 K/uL   Eosinophils Relative 0 %   Eosinophils Absolute 0.0 0.0 - 0.7 K/uL   Basophils Relative 0 %   Basophils Absolute 0.0 0.0 - 0.1 K/uL  Brain natriuretic peptide     Status: Abnormal   Collection Time: 03/26/16 12:15 PM  Result Value Ref  Range   B Natriuretic Peptide 894.3 (H) 0.0 - 100.0 pg/mL  Protime-INR     Status: Abnormal   Collection Time: 03/26/16 12:15 PM  Result Value Ref Range   Prothrombin Time 62.2 (H) 11.4 - 15.2 seconds    Comment: REPEATED TO VERIFY   INR 6.94 (HH)     Comment: REPEATED TO VERIFY CRITICAL RESULT CALLED TO, READ BACK BY AND VERIFIED WITH: C. PRICE RN [id number] [phone] R   I-stat troponin, ED     Status: None   Collection Time: 03/26/16 12:25 PM  Result Value Ref Range   Troponin i, poc 0.05 0.00 - 0.08 ng/mL   Comment 3            Comment: Due to the release kinetics of cTnI, a negative result within the first hours of the onset of symptoms does not rule out myocardial infarction with certainty. If myocardial infarction is still suspected, repeat the test at appropriate intervals.   Magnesium     Status: None   Collection Time: 03/26/16  4:26 PM  Result Value Ref Range   Magnesium 1.7 1.7 - 2.4 mg/dL  MRSA PCR Screening     Status: None   Collection Time: 03/26/16  5:59 PM  Result Value Ref Range   MRSA by PCR NEGATIVE NEGATIVE    Comment:        The GeneXpert MRSA Assay (FDA approved for NASAL specimens only), is one component of a comprehensive MRSA colonization surveillance program. It is not intended to diagnose MRSA infection nor to guide or monitor treatment for MRSA infections.   Glucose, capillary     Status: Abnormal   Collection Time: 03/26/16  9:21 PM  Result Value Ref Range   Glucose-Capillary 313 (H) 65 - 99 mg/dL  Glucose, capillary     Status: Abnormal   Collection Time: 03/26/16 11:36 PM  Result Value Ref Range   Glucose-Capillary 256 (H) 65 - 99 mg/dL   Comment 1 Notify RN   Basic metabolic panel     Status: Abnormal   Collection Time: 03/27/16  3:19 AM  Result Value Ref Range   Sodium 137 135 - 145 mmol/L   Potassium 3.1 (L) 3.5 - 5.1 mmol/L   Chloride 101 101 - 111 mmol/L   CO2 19 (L) 22 - 32 mmol/L   Glucose, Bld 213 (H) 65 - 99 mg/dL    BUN 162 (H) 6 - 20 mg/dL   Creatinine, Ser 6.56 (H) 0.61 - 1.24 mg/dL   Calcium 7.2 (L) 8.9 - 10.3 mg/dL   GFR calc non Af Amer 8 (L) >60 mL/min   GFR calc Af Amer 10 (L) >60 mL/min    Comment: (NOTE) The eGFR has been calculated using the CKD EPI equation. This calculation has not been validated in all clinical situations. eGFR's persistently <60 mL/min signify possible Chronic Kidney Disease.    Anion gap 17 (H) 5 - 15  CBC     Status: Abnormal   Collection Time: 03/27/16  3:19 AM  Result Value Ref Range  WBC 12.9 (H) 4.0 - 10.5 K/uL   RBC 3.08 (L) 4.22 - 5.81 MIL/uL   Hemoglobin 8.8 (L) 13.0 - 17.0 g/dL   HCT 27.4 (L) 39.0 - 52.0 %   MCV 89.0 78.0 - 100.0 fL   MCH 28.6 26.0 - 34.0 pg   MCHC 32.1 30.0 - 36.0 g/dL   RDW 19.4 (H) 11.5 - 15.5 %   Platelets 152 150 - 400 K/uL  Protime-INR     Status: Abnormal   Collection Time: 03/27/16  3:19 AM  Result Value Ref Range   Prothrombin Time 71.1 (H) 11.4 - 15.2 seconds    Comment: REPEATED TO VERIFY   INR 8.20 (HH)     Comment: REPEATED TO VERIFY CRITICAL RESULT CALLED TO, READ BACK BY AND VERIFIED WITH: B.REAT,RN 03/27/16 M.CAMPBELL CALLED AT 0400   Glucose, capillary     Status: Abnormal   Collection Time: 03/27/16  4:32 AM  Result Value Ref Range   Glucose-Capillary 263 (H) 65 - 99 mg/dL   Comment 1 Notify RN   Glucose, capillary     Status: Abnormal   Collection Time: 03/27/16  8:25 AM  Result Value Ref Range   Glucose-Capillary 270 (H) 65 - 99 mg/dL    Dg Chest 2 View  Result Date: 03/26/2016 CLINICAL DATA:  Shortness of breath for 6 days. EXAM: CHEST  2 VIEW COMPARISON:  Chest x-rays dated 02/02/2016 and 06/24/2015. FINDINGS: There is stable mild cardiomegaly. Overall cardiomediastinal silhouette is stable in size and configuration. There is central pulmonary vascular congestion and prominent perihilar edema, left greater than right. Additional interstitial edema throughout both lungs. More confluent opacity at the  left lung base could be associated alveolar pulmonary edema or superimposed pneumonia. Probable small left pleural effusion. IMPRESSION: 1. Cardiomegaly with pulmonary edema pattern consistent with CHF/volume overload. 2. A more confluent dense opacity at the left lung base (lingula) could be asymmetric alveolar pulmonary edema or superimposed pneumonia. In the absence of fever, favor asymmetric pulmonary edema. 3. Probable small left pleural effusion. Electronically Signed   By: Franki Cabot M.D.   On: 03/26/2016 12:44    ROS Blood pressure (!) 148/127, pulse 89, temperature 97.6 F (36.4 C), temperature source Oral, resp. rate 13, height _0  (2.083 m), weight 130 kg (286 lb 9.6 oz), SpO2 97 %. Physical Exam Physical Examination: General appearance - unkempt, edematous, SOB Mental status - alert, oriented to person, place, and time, affect inappropriate  Eyes - DM retinal changes Mouth - reddened, very poor dentition Neck - adenopathy noted PCL Lymphatics - posterior cervical nodes Chest - decreased, bs, rales in bases, diffuse rhonchi,  Heart - S1 and S2 normal, systolic murmur FV4/9 at apex Abdomen - distended, pos bs,liver down 6 cm Extremities - pedal edema 3 +, DP tr bilat, 2nd toe gone L foot,  Skin - bruising up to upper calves, evidence scratching, stasis changes also  Assessment/Plan: 1 CKD 5 vol xs, acidemic , early uremia. With his overall situation , would rec start HD.  Will make plans. Need to get PC and perm access.  Needs INR down.  Will try to progress with this, but he may limit.   2 DM 3 Hypertension: needs lower vol. 4. Anemia will eval. And tx 5. Metabolic Bone Disease: check PTH and phos 6 Hep c treated 7 Bipolar 8 Chronic pain 9 Arthritis 10 COPD 11 Smoking P access, Hep studies, map, educate, control DM, normalize INR.   [place] L 03/27/2016, 11:14  AM

## 2016-03-27 NOTE — Consult Note (Addendum)
Referred by:  Dr. Jimmy Footman  Reason for referral: New access  History of Present Illness  Johnny Sims is a 60 y.o. (December 24, 1955) male who presents for evaluation for permanent access.  The patient is somewhat confused. The patient is right hand dominant.  The patient has not had previous access procedures.  Previous central venous cannulation procedures include: none.  The patient has never had a PPM placed.   Past Medical History:  Diagnosis Date  . Anxiety   . Asthma   . Bipolar 1 disorder ([place])   . Chronic pain   . CKD (chronic kidney disease), stage II   . COPD (chronic obstructive pulmonary disease) ([place])   . Diabetes mellitus   . Fibromyalgia   . GERD (gastroesophageal reflux disease)   . Hepatitis C   . Hepatitis C   . Hyperlipidemia   . Hypertension   . Hypothyroidism   . Melanoma ([place])    face, shoulder arm  . OA (osteoarthritis)   . PAF (paroxysmal atrial fibrillation) ([place])   . Pancreatitis, acute 2015  . RA (rheumatoid arthritis) ([place])   . Venous stasis     Past Surgical History:  Procedure Laterality Date  . AMPUTATION Left 11/11/2014   Procedure: FOOT, SECOND [place] AMPUTATION;  Surgeon: Newt Minion, MD;  Location: [place];  Service: Orthopedics;  Laterality: Left;  . CARDIOVERSION  02/03/2015  . CARDIOVERSION N/A 02/03/2015   Procedure: CARDIOVERSION;  Surgeon: Thayer Headings, MD;  Location: [place];  Service: Cardiovascular;  Laterality: N/A;  . FRACTURE SURGERY Left    arm  . HERNIA REPAIR     umbicial  . INGUINAL HERNIA REPAIR Bilateral   . KNEE SURGERY     /H&P 02/20/2010  . SKIN CANCER EXCISION     melonoma  . SUBMANDIBULAR GLAND EXCISION Left 12/25/2013   w/sialolithotomy  . SUBMANDIBULAR GLAND EXCISION Left 12/25/2013   Procedure: SUBMANDIBULAR GLAND STONE AND CHRONIC SIALOLITHIASIS EXCISION;  Surgeon: Melida Quitter, MD;  Location: [place];  Service: ENT;  Laterality: Left;  . THYROIDECTOMY    . TONSILLECTOMY    . US ECHOCARDIOGRAPHY   02/19/2010   EF 55-60%    Social History   Social History  . Marital status: Single    Spouse name: N/A  . Number of children: N/A  . Years of education: N/A   Occupational History  . Not on file.   Social History Main Topics  . Smoking status: Current Every Day Smoker    Packs/day: 1.00    Years: 43.00    Types: Cigarettes  . Smokeless tobacco: Never Used  . Alcohol use Yes     Comment: rare  . Drug use: No  . Sexual activity: Not on file   Other Topics Concern  . Not on file   Social History Narrative  . No narrative on file    Family History  Problem Relation Age of Onset  . Hypertension Mother   . Alzheimer's disease Mother   . Heart disease Father     Current Facility-Administered Medications  Medication Dose Route Frequency Provider Last Rate Last Dose  . albuterol (PROVENTIL) (2.5 MG/3ML) 0.083% nebulizer solution 2.5 mg  2.5 mg Nebulization Q4H PRN Annia Belt, MD      . calcium acetate (PHOSLO) capsule 667 mg  667 mg Oral TID WC Mauricia Area, MD   667 mg at 03/27/16 1325  . cholecalciferol (VITAMIN D) tablet 1,000 Units  1,000 Units Oral Daily Alexa  Angela Burke, MD   1,000 Units at 03/27/16 1019  . diazepam (VALIUM) tablet 10 mg  10 mg Oral Q6H Alexa R Quay Burow, MD   10 mg at 03/27/16 1612  . [START ON 03/28/2016] fentaNYL (DURAGESIC - dosed mcg/hr) patch 75 mcg  75 mcg Transdermal Q72H Velna Ochs, MD      . fluticasone (FLONASE) 50 MCG/ACT nasal spray 1 spray  1 spray Each Nare Daily PRN Alexa Angela Burke, MD      . furosemide (LASIX) 120 mg in dextrose 5 % 50 mL IVPB  120 mg Intravenous Q12H Alexa R Burns, MD   120 mg at 03/27/16 0846  . hydrALAZINE (APRESOLINE) tablet 25 mg  25 mg Oral TID Florinda Marker, MD   25 mg at 03/27/16 1612  . [START ON 03/28/2016] Influenza vac split quadrivalent PF (FLUARIX) injection 0.5 mL  0.5 mL Intramuscular Tomorrow-1000 Annia Belt, MD      . insulin glargine (LANTUS) injection 30 Units  30 Units Subcutaneous  Q2200 Velna Ochs, MD      . ipratropium-albuterol (DUONEB) 0.5-2.5 (3) MG/3ML nebulizer solution 3 mL  3 mL Nebulization Q6H Alexa R Burns, MD   3 mL at 03/27/16 1441  . [START ON 03/28/2016] levofloxacin (LEVAQUIN) IVPB 500 mg  500 mg Intravenous Q48H Annia Belt, MD      . levothyroxine (SYNTHROID, LEVOTHROID) tablet 88 mcg  88 mcg Oral QAC breakfast Alexa Angela Burke, MD   88 mcg at 03/27/16 0520  . metoprolol (LOPRESSOR) tablet 100 mg  100 mg Oral q morning - 10a Alexa R Burns, MD   100 mg at 03/27/16 1017  . metoprolol (LOPRESSOR) tablet 50 mg  50 mg Oral QPM Alexa Angela Burke, MD   50 mg at 03/26/16 1726  . multivitamin (RENA-VIT) tablet 1 tablet  1 tablet Oral QHS Mauricia Area, MD      . nicotine (NICODERM CQ - dosed in mg/24 hours) patch 14 mg  14 mg Transdermal Daily Alexa Angela Burke, MD   14 mg at 03/27/16 1022  . nitroGLYCERIN (NITROGLYN) 2 % ointment 0.5 inch  0.5 inch Topical Q6H Leonard Schwartz, MD   0.5 inch at 03/27/16 1325  . nitroGLYCERIN (NITROSTAT) SL tablet 0.4 mg  0.4 mg Sublingual Q5 min PRN Leonard Schwartz, MD      . Derrill Memo ON 03/28/2016] pneumococcal 23 valent vaccine (PNU-IMMUNE) injection 0.5 mL  0.5 mL Intramuscular Tomorrow-1000 Annia Belt, MD      . potassium chloride SA (K-DUR,KLOR-CON) CR tablet 40 mEq  40 mEq Oral BID Alexa Angela Burke, MD   40 mEq at 03/27/16 1019  . predniSONE (DELTASONE) tablet 40 mg  40 mg Oral Q breakfast Alexa Angela Burke, MD   40 mg at 03/27/16 0849  . simvastatin (ZOCOR) tablet 20 mg  20 mg Oral q1800 Alexa Angela Burke, MD   20 mg at 03/26/16 1712  . sodium chloride flush (NS) 0.9 % injection 3 mL  3 mL Intravenous Q12H Alexa R Burns, MD   3 mL at 03/27/16 1023    Allergies  Allergen Reactions  . Novolog [Insulin Aspart] Other (See Comments)    UNSPECIFIED REACTION  "I'm just highly allergic"  . Hydrocodone-Acetaminophen Palpitations and Other (See Comments)    Doesn't want to take  . Iodides Other (See Comments)    Patient states he  doesn't know what iodine is and doesn't think he is allergic to it despite it being listed with his allergies  .  Other Hives and Other (See Comments)    Steroid creams "Corndogs"- Nausea & vomiting   . Sulfacetamide Sodium Hives and Itching  . Tylenol [Acetaminophen] Nausea And Vomiting  . Gabapentin Swelling    SWELLING REACTION UNSPECIFIED   . Oxycodone-Acetaminophen Nausea And Vomiting  . Antihistamines, Chlorpheniramine-Type Other (See Comments)    unknown  . Hydralazine Rash  . Iodinated Diagnostic Agents Other (See Comments)    Patient states he doesn't know what iodine is and doesn't think he is allergic to it despite it being listed with his allergies  . Latex Rash  . Lidocaine Itching and Other (See Comments)    Patient is uncertain of this allergy  . Pentazocine Other (See Comments)    unknown  . Pheniramine Other (See Comments)    unknown  . Sulfa Antibiotics Itching and Other (See Comments)  . Tape Rash  . Vicodin [Hydrocodone-Acetaminophen] Palpitations and Other (See Comments)    Doesn't want to take    REVIEW OF SYSTEMS:   Cardiac:  positive for: SOB with exertion and laying flat, negative for: Chest pain or chest pressure,   Vascular:  positive for: Leg swelling,  negative for: Pain in calf, thigh, or hip brought on by ambulation, Pain in feet at night that wakes you up from your sleep and Blood clot in your veins  Pulmonary:  positive for: SOB,  negative for: Oxygen at home, Productive cough and Wheezing  Neurologic:  positive for: no symptoms, negative for: Sudden weakness in arms or legs, Sudden numbness in arms or legs, Sudden onset of difficulty speaking or slurred speech, Temporary loss of vision in one eye and Problems with dizziness  Gastrointestinal:  positive for: no symptoms, negative for: Blood in stool and Vomited blood  Genitourinary:  positive for: no symptoms, negative for: Burning when urinating and Blood in urine  Psychiatric:    positive for: no symptoms,  negative for: Major depression  Hematologic:  positive for: Bleeding problems,  negative for: negative for: Problems with blood clotting too easily  Dermatologic:  positive for: no symptoms, negative for: Rashes or ulcers  Constitutional:  positive for: no symptoms, negative for: Fever or chills  Musculoskeletal: Positive for: no symptoms Negative for: chronic back pain or joint pain  Ear/Nose/Throat:  Positive for: no symptoms, Negative for:  Change in eyesight, Change in hearing, Nose bleeds, Sore throat   Physical Examination  Vitals:   03/27/16 0830 03/27/16 1354 03/27/16 1442 03/27/16 1613  BP:  (!) 153/96  (!) 158/111  Pulse:  96  (!) 113  Resp:  20  16  Temp:  97 F (36.1 C)  97.5 F (36.4 C)  TempSrc:  Axillary  Oral  SpO2: 97%  98% 94%  Weight:      Height:        Body mass index is 29.97 kg/m.  General: Alert, O x 3, WD,NAD  Head: [place]/AT,   Ear/Nose/Throat: Hearing grossly intact, nares without erythema or drainage, oropharynx without Erythema or Exudate , Mallampati score: 3, Dentition intact  Eyes: PERRLA, EOMI,   Neck: Supple, mid-line trachea,    Pulmonary: Sym exp, good B air movt,CTA B  Cardiac: RRR, Nl S1, S2, no Murmurs, No rubs, No S3,S4  Vascular: Vessel Right Left  Radial Palpable Palpable  Brachial Palpable Palpable  Carotid Palpable, No Bruit Palpable, No Bruit  Aorta Not palpable N/A  Femoral Palpable Palpable  Popliteal Not palpable Not palpable  PT Palpable Palpable  DP Palpable Palpable  Gastrointestinal: soft, non-distended, large pannus, non-tender to palpation, No guarding or rebound, no HSM, no masses, no CVAT B, No palpable prominent aortic pulse due to apnnus  Musculoskeletal: M/S 5/5 throughout  , Extremities without ischemic changes  , Edema in B legs,  , LDS present: B legs, ulcers in both legs  Neurologic: CN 2-12 intact , Pain and light touch intact in extremities , Motor exam  as listed above  Psychiatric: Judgement not intact, Mood & affect appropriate for pt's clinical situation  Dermatologic: See M/S exam for extremity exam, echymotic patch throughout, consistent with warfarin use  Lymph : Palpable lymph nodes: None   Non-Invasive Vascular Imaging  [facility]UE Vein Mapping  Ordered  Laboratory: CBC:    Component Value Date/Time   WBC 12.9 (H) 03/27/2016 0319   RBC 3.08 (L) 03/27/2016 0319   HGB 8.8 (L) 03/27/2016 0319   HCT 27.4 (L) 03/27/2016 0319   PLT 152 03/27/2016 0319   MCV 89.0 03/27/2016 0319   MCH 28.6 03/27/2016 0319   MCHC 32.1 03/27/2016 0319   RDW 19.4 (H) 03/27/2016 0319   LYMPHSABS 1.2 03/26/2016 1215   MONOABS 0.7 03/26/2016 1215   EOSABS 0.0 03/26/2016 1215   BASOSABS 0.0 03/26/2016 1215    BMP:    Component Value Date/Time   NA 137 03/27/2016 0319   K 3.1 (L) 03/27/2016 0319   CL 101 03/27/2016 0319   CO2 19 (L) 03/27/2016 0319   GLUCOSE 213 (H) 03/27/2016 0319   BUN 162 (H) 03/27/2016 0319   CREATININE 6.56 (H) 03/27/2016 0319   CREATININE 2.64 (H) 04/28/2015 1053   CALCIUM 7.2 (L) 03/27/2016 0319   GFRNONAA 8 (L) 03/27/2016 0319   GFRAA 10 (L) 03/27/2016 0319    Coagulation: Lab Results  Component Value Date   INR 8.20 (HH) 03/27/2016   INR 6.94 (HH) 03/26/2016   INR 2.8 02/10/2016   No results found for: PTT  Lipids:    Component Value Date/Time   CHOL  02/19/2010 0418    114        ATP III CLASSIFICATION:  <200     mg/dL   Desirable  200-239  mg/dL   Borderline High  >=240    mg/dL   High          TRIG 266 (H) 02/19/2010 0418   HDL 28 (L) 02/19/2010 0418   CHOLHDL 4.1 02/19/2010 0418   VLDL 53 (H) 02/19/2010 0418   LDLCALC  02/19/2010 0418    33        Total Cholesterol/HDL:CHD Risk Coronary Heart Disease Risk Table                     Men   Women  1/2 Average Risk   3.4   3.3  Average Risk       5.0   4.4  2 X Average Risk   9.6   7.1  3 X Average Risk  23.4   11.0        Use the calculated  Patient Ratio above and the CHD Risk Table to determine the patient's CHD Risk.        ATP III CLASSIFICATION (LDL):  <100     mg/dL   Optimal  100-129  mg/dL   Near or Above                    Optimal  130-159  mg/dL   Borderline  160-189  mg/dL  High  >190     mg/dL   Very High    Radiology: Dg Chest 2 View  Result Date: 03/26/2016 CLINICAL DATA:  Shortness of breath for 6 days. EXAM: CHEST  2 VIEW COMPARISON:  Chest x-rays dated 02/02/2016 and 06/24/2015. FINDINGS: There is stable mild cardiomegaly. Overall cardiomediastinal silhouette is stable in size and configuration. There is central pulmonary vascular congestion and prominent perihilar edema, left greater than right. Additional interstitial edema throughout both lungs. More confluent opacity at the left lung base could be associated alveolar pulmonary edema or superimposed pneumonia. Probable small left pleural effusion. IMPRESSION: 1. Cardiomegaly with pulmonary edema pattern consistent with CHF/volume overload. 2. A more confluent dense opacity at the left lung base (lingula) could be asymmetric alveolar pulmonary edema or superimposed pneumonia. In the absence of fever, favor asymmetric pulmonary edema. 3. Probable small left pleural effusion. Electronically Signed   By: Franki Cabot M.D.   On: 03/26/2016 12:44     Medical Decision Making  Johnny Sims is a 61 y.o. male who presents with AMS likely related to his ESRD, CHF, Afib   INR is too high to proceed with [facility] placement.  Will need to be reversed before can proceed with such.  Access options will depend on his vein mapping.  Will keep an eye on the patient and post him for [facility] placement immediately once coumadin reversed.  If vein mapping available at the same time, will go ahead place access at the same time.  Consent might be an issue given his current state of confusion, likely due to the uremia.Adele Barthel, MD [facility]  [place] Office: [phone] Pager: [phone]  03/27/2016, 4:57 PM

## 2016-03-27 NOTE — Progress Notes (Signed)
Subjective: Patient feels well this morning and says his breathing has improved. He continues to complain of diffuse generalize pain and requesting more pain medication. Otherwise patient has no complaints.   Objective:  Vital signs in last 24 hours: Vitals:   03/27/16 0432 03/27/16 0500 03/27/16 0826 03/27/16 0830  BP: (!) 160/92  (!) 148/127   Pulse: 84  89   Resp: 13     Temp: 97.7 F (36.5 C)  97.6 F (36.4 C)   TempSrc: Oral  Oral   SpO2: 97%   97%  Weight:  286 lb 9.6 oz (130 kg)    Height:       Physical Exam Constitutional: NAD, appears comfortable HEENT: Atraumatic, normocephalic. anicteric sclera.  Cardiovascular: RRR, no murmurs, rubs, or gallops.  Pulmonary/Chest: Mild expiratory wheezing, bilateral lower lobe crackles L>R, breathing comfortably on room air Abdominal: Soft, non tender, non distended. +BS.  Extremities: Warm and well perfused. Distal pulses intact. 2+ pitting edema to bilateral knees with chronic overlying skin changes of discoloration and dermatitis. 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  Skin: No rashes or erythema  Psychiatric: Normal mood and affect  Assessment/Plan:  Acute on Chronic CHF: History of HFpEF, last ECHO in May 2016, LV EF 55 - 60%. CXR on admission with pulmonary edema. Diuresing well with 2L output overnight. -- Continue Lasix 120 mg IV BID -- Continue home metoprolol 100 mg AM and 50 mg PM -- Continue home hydralazine 25 mg TID -- ECHO today -- Cards consulted; appreciate recs   COPD Exacerbation: Diffuse wheezing on admission improving with breathing treatments. CXR with left lower lobe consolidations concerning for PNA. -- Continue Duonebs q6h -- Prednisone 40mg  and Levofloxacin x 5 days  Acute on Chronic Renal Failure: Progressively worsening renal function over the past few years, now with creatinine > 6. He continues to have good UOP and is  diuresing well with IV lasix. He is scheduled for outpatient nephrology follow up. Previous work up for glomerulonephritis in the setting of chronic hematuria and proteinuria has been negative.  Missed his last nephrology appointment.  -- Nephrology consult -- Avoid nephrotoxic meds -- Continue to monitor BMP  Supratheurapeutic INR: On warfarin for stroke prophylaxis in the setting of chronic a-fib. INR 6.9 on admission and increased to 8.2 this morning. No signs of active bleeding at this time. Received 2.5mg  Vit K. -- Pharmacy consult -- Will give additional 2.5 mg IV Vit K -- Continue to monitor INR -- Continue to monitor for any signs or symptoms of bleeding   Chronic Atrial Fibrillation: Rates well controlled on home regimen -- Continue metoprolol 100 mg AM and 50 mg PM  Hypokalemia: Asymptomatic -- Continue tele -- 40 mg PO KCL BID for 5 doses, has received one dose thus far and potassium is improving -- Continue to monitor   Anemia: Worsening of his chronic normocytic anemia, likely due to progression of his CKD. -- Check EPO level -- Nephro consult, appreciate recs -- Continue to monitor   DM2: On Lantus 30U daily at home, allergy to Novolog.  -- Lantus 25 U QHS -- Fasting CBGs  Hypothyroidism: -- Continue home synthroid  Hyperlipidemia: -- Continue home statin   Anxiety: -- Continue home diazepam 10 mg Q6h   Chronic pain: On chronic opioids. Prescribed fentanyl patch in June, verified with database. Prescribed oxy however patient reports associated nausea. -- Fentanyl patch 75 mcg Q72H --  Follow up with PCP   FEN: Fluid restrict, replete lytes prn, carb modified diet VTE ppx: SCDs, holding warfarin - pharmacy consult, will start heparin 5000 units TID Code Status: FULL    Dispo: Anticipated discharge in approximately 1-2 day(s).   Velna Ochs, MD 03/27/2016, 11:24 AM Pager: [phone]

## 2016-03-28 ENCOUNTER — Inpatient Hospital Stay (HOSPITAL_COMMUNITY): Payer: Medicaid Other

## 2016-03-28 ENCOUNTER — Other Ambulatory Visit: Payer: Self-pay | Admitting: Internal Medicine

## 2016-03-28 DIAGNOSIS — I509 Heart failure, unspecified: Secondary | ICD-10-CM

## 2016-03-28 DIAGNOSIS — Z01810 Encounter for preprocedural cardiovascular examination: Secondary | ICD-10-CM

## 2016-03-28 DIAGNOSIS — E785 Hyperlipidemia, unspecified: Secondary | ICD-10-CM

## 2016-03-28 LAB — ECHOCARDIOGRAM COMPLETE
CHL CUP RV SYS PRESS: 60 mmHg
FS: 22 % — AB (ref 28–44)
Height: 82 in
IV/PV OW: 0.87
LA diam end sys: 54 mm
LA diam index: 1.98 cm/m2
LA vol index: 39.6 mL/m2
LA vol: 108 mL
LASIZE: 54 mm
LAVOLA4C: 101 mL
LVOT area: 4.15 cm2
LVOTD: 23 mm
PW: 14.3 mm — AB (ref 0.6–1.1)
Reg peak vel: 334 cm/s
TR max vel: 334 cm/s
WEIGHTICAEL: 4585.57 [oz_av]

## 2016-03-28 LAB — CBC
HCT: 26.3 % — ABNORMAL LOW (ref 39.0–52.0)
HEMATOCRIT: 26.4 % — AB (ref 39.0–52.0)
Hemoglobin: 8.5 g/dL — ABNORMAL LOW (ref 13.0–17.0)
Hemoglobin: 8.6 g/dL — ABNORMAL LOW (ref 13.0–17.0)
MCH: 28.4 pg (ref 26.0–34.0)
MCH: 28.7 pg (ref 26.0–34.0)
MCHC: 32.3 g/dL (ref 30.0–36.0)
MCHC: 32.6 g/dL (ref 30.0–36.0)
MCV: 88 fL (ref 78.0–100.0)
MCV: 88 fL (ref 78.0–100.0)
PLATELETS: 146 10*3/uL — AB (ref 150–400)
Platelets: 143 10*3/uL — ABNORMAL LOW (ref 150–400)
RBC: 2.99 MIL/uL — AB (ref 4.22–5.81)
RBC: 3 MIL/uL — AB (ref 4.22–5.81)
RDW: 19.8 % — ABNORMAL HIGH (ref 11.5–15.5)
RDW: 19.4 % — AB (ref 11.5–15.5)
WBC: 11 10*3/uL — ABNORMAL HIGH (ref 4.0–10.5)
WBC: 14.5 10*3/uL — AB (ref 4.0–10.5)

## 2016-03-28 LAB — RENAL FUNCTION PANEL
Albumin: 3.1 g/dL — ABNORMAL LOW (ref 3.5–5.0)
Anion gap: 17 — ABNORMAL HIGH (ref 5–15)
BUN: 166 mg/dL — ABNORMAL HIGH (ref 6–20)
CALCIUM: 7.6 mg/dL — AB (ref 8.9–10.3)
CO2: 21 mmol/L — AB (ref 22–32)
CREATININE: 6.72 mg/dL — AB (ref 0.61–1.24)
Chloride: 98 mmol/L — ABNORMAL LOW (ref 101–111)
GFR calc non Af Amer: 8 mL/min — ABNORMAL LOW (ref >60)
GFR, EST AFRICAN AMERICAN: 9 mL/min — AB (ref >60)
GLUCOSE: 306 mg/dL — AB (ref 65–99)
Phosphorus: 6.4 mg/dL — ABNORMAL HIGH (ref 2.5–4.6)
Potassium: 3.2 mmol/L — ABNORMAL LOW (ref 3.5–5.1)
SODIUM: 136 mmol/L (ref 135–145)

## 2016-03-28 LAB — ERYTHROPOIETIN: ERYTHROPOIETIN: 5.6 m[IU]/mL (ref 2.6–18.5)

## 2016-03-28 LAB — PARATHYROID HORMONE, INTACT (NO CA): PTH: 146 pg/mL — AB (ref 15–65)

## 2016-03-28 LAB — GLUCOSE, CAPILLARY
GLUCOSE-CAPILLARY: 343 mg/dL — AB (ref 65–99)
GLUCOSE-CAPILLARY: 412 mg/dL — AB (ref 65–99)
Glucose-Capillary: 256 mg/dL — ABNORMAL HIGH (ref 65–99)
Glucose-Capillary: 354 mg/dL — ABNORMAL HIGH (ref 65–99)

## 2016-03-28 LAB — HEPATITIS B CORE ANTIBODY, IGM: Hep B C IgM: NEGATIVE

## 2016-03-28 LAB — HEPATITIS B SURFACE ANTIBODY,QUALITATIVE: HEP B S AB: REACTIVE

## 2016-03-28 LAB — COMMENT2 - HEP PANEL

## 2016-03-28 LAB — HEPATITIS C ANTIBODY (REFLEX): HCV Ab: >11 s/co ratio — ABNORMAL HIGH (ref 0.0–0.9)

## 2016-03-28 LAB — HEPATITIS B SURFACE ANTIGEN: Hepatitis B Surface Ag: NEGATIVE

## 2016-03-28 LAB — PROTIME-INR
INR: 2.65
PROTHROMBIN TIME: 28.8 s — AB (ref 11.4–15.2)

## 2016-03-28 MED ORDER — MORPHINE SULFATE (PF) 2 MG/ML IV SOLN
1.0000 mg | INTRAVENOUS | Status: DC | PRN
  Administered 2016-03-28: 1 mg via INTRAVENOUS
  Filled 2016-03-28: qty 1

## 2016-03-28 MED ORDER — OXYCODONE HCL 5 MG PO TABS: 5.0000 mg | ORAL_TABLET | Freq: Four times a day (QID) | ORAL | Status: DC | PRN

## 2016-03-28 MED ORDER — SODIUM CHLORIDE 0.9 % IV SOLN
125.0000 mg | Freq: Every day | INTRAVENOUS | Status: AC
  Administered 2016-03-28 – 2016-04-01 (×4): 125 mg via INTRAVENOUS
  Filled 2016-03-28 (×8): qty 10

## 2016-03-28 MED ORDER — CALCIUM ACETATE (PHOS BINDER) 667 MG PO CAPS
1334.0000 mg | ORAL_CAPSULE | Freq: Three times a day (TID) | ORAL | Status: DC
  Administered 2016-03-28 – 2016-04-02 (×12): 1334 mg via ORAL
  Filled 2016-03-28 (×12): qty 2

## 2016-03-28 MED ORDER — IPRATROPIUM-ALBUTEROL 0.5-2.5 (3) MG/3ML IN SOLN
3.0000 mL | Freq: Three times a day (TID) | RESPIRATORY_TRACT | Status: DC
  Administered 2016-03-28 – 2016-04-01 (×9): 3 mL via RESPIRATORY_TRACT
  Filled 2016-03-28 (×7): qty 3
  Filled 2016-03-28: qty 30
  Filled 2016-03-28 (×4): qty 3

## 2016-03-28 MED ORDER — POTASSIUM CHLORIDE CRYS ER 20 MEQ PO TBCR
40.0000 meq | EXTENDED_RELEASE_TABLET | Freq: Two times a day (BID) | ORAL | Status: DC
  Administered 2016-03-28: 40 meq via ORAL
  Filled 2016-03-28: qty 2

## 2016-03-28 MED ORDER — PHYTONADIONE 5 MG PO TABS
5.0000 mg | ORAL_TABLET | Freq: Once | ORAL | Status: AC
  Administered 2016-03-28: 5 mg via ORAL
  Filled 2016-03-28: qty 1

## 2016-03-28 MED ORDER — METOPROLOL TARTRATE 100 MG PO TABS
100.0000 mg | ORAL_TABLET | Freq: Two times a day (BID) | ORAL | Status: DC
  Administered 2016-03-28 – 2016-04-02 (×10): 100 mg via ORAL
  Filled 2016-03-28: qty 2
  Filled 2016-03-28: qty 1
  Filled 2016-03-28: qty 2
  Filled 2016-03-28: qty 1
  Filled 2016-03-28: qty 2
  Filled 2016-03-28 (×2): qty 1
  Filled 2016-03-28: qty 2
  Filled 2016-03-28 (×2): qty 1

## 2016-03-28 MED ORDER — DARBEPOETIN ALFA 100 MCG/0.5ML IJ SOSY
100.0000 ug | PREFILLED_SYRINGE | INTRAMUSCULAR | Status: DC
  Administered 2016-03-29: 100 ug via INTRAVENOUS
  Filled 2016-03-28: qty 0.5

## 2016-03-28 MED ORDER — LEVOFLOXACIN 500 MG PO TABS
500.0000 mg | ORAL_TABLET | ORAL | Status: AC
  Administered 2016-03-28 – 2016-03-30 (×2): 500 mg via ORAL
  Filled 2016-03-28 (×2): qty 1

## 2016-03-28 MED ORDER — INSULIN GLARGINE 100 UNIT/ML ~~LOC~~ SOLN
35.0000 [IU] | Freq: Every day | SUBCUTANEOUS | Status: DC
  Administered 2016-03-28: 35 [IU] via SUBCUTANEOUS
  Filled 2016-03-28 (×2): qty 0.35

## 2016-03-28 MED ORDER — POTASSIUM CHLORIDE CRYS ER 20 MEQ PO TBCR
40.0000 meq | EXTENDED_RELEASE_TABLET | Freq: Three times a day (TID) | ORAL | Status: DC
  Administered 2016-03-28: 40 meq via ORAL
  Filled 2016-03-28: qty 2

## 2016-03-28 MED ORDER — HYDROMORPHONE HCL 2 MG PO TABS
1.0000 mg | ORAL_TABLET | Freq: Three times a day (TID) | ORAL | Status: DC | PRN
  Administered 2016-03-28 – 2016-03-29 (×2): 1 mg via ORAL
  Filled 2016-03-28: qty 1

## 2016-03-28 NOTE — Progress Notes (Signed)
Contacted ortho tech to have una boots removed so that foams covering wounds could be removed and una boots reapplied. Ortho Tech states they will order more supplies & redress bilateral legs today.

## 2016-03-28 NOTE — Progress Notes (Signed)
MD paged; this RN asked for patient PRN med to be changed to different from oxy which was on patient allergy list. MD gave new order for prn morphine.

## 2016-03-28 NOTE — Progress Notes (Signed)
Subjective: Patient is doing well this morning. He continues to complain generalize pain. He says his breathing has improved and he dies any chest pain or cough. He is sitting up in bed eating breakfast.   Objective:  Vital signs in last 24 hours: Vitals:   03/27/16 2325 03/28/16 0238 03/28/16 0402 03/28/16 0500  BP: (!) 149/87  (!) 162/91   Pulse: 93  (!) 104   Resp: 20  19   Temp: 97.6 F (36.4 C)  97.8 F (36.6 C)   TempSrc: Oral  Oral   SpO2: 98% 98% 96%   Weight:    286 lb 9.6 oz (130 kg)  Height:       Physical Exam Constitutional: NAD, appears comfortable HEENT: Atraumatic, normocephalic. anicteric sclera.  Cardiovascular: RRR, no murmurs, rubs, or gallops.  Pulmonary/Chest: Mild expiratory wheezing, bibasilar crackles improving, breathing comfortably on room air Abdominal: Soft, non tender, non distended. +BS.  Extremities: Warm and well perfused. Distal pulses intact. 2+ pitting edema to bilateral knees with chronic overlying skin changes of discoloration and dermatitis. 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  Skin: Small black cirrcular lesion at previous biopsy site on mid back with surrounding scarring.  Psychiatric: Normal mood and affect  Assessment/Plan:  Acute on Chronic CHF: History of HFpEF, last ECHO in May 2016, LV EF 55 - 60%. CXR on admission with pulmonary edema. Diuresing well with 3.2 L output overnight. Breathing has improved. Weight unchanged since admission.  -- Continue Lasix 120 mg IV BID -- Continue home metoprolol 100 mg AM and 50 mg PM -- Continue home hydralazine 25 mg TID -- ECHO today -- Cards consulted; appreciate recs   COPD Exacerbation: Diffuse wheezing on admission improving with breathing treatments. CXR with left lower lobe consolidations concerning for PNA. Clinically improving.  -- Continue Duonebs q6h -- Prednisone 40mg  and PO Levofloxacin 500 mg Q48  hours to complete 5 days  Acute on Chronic Renal Failure: Progressively worsening renal function over the past few years, now with creatinine > 6. He continues to have good UOP and is diuresing well with IV lasix. He is scheduled for outpatient nephrology follow up. Previous work up for glomerulonephritis in the setting of chronic hematuria and proteinuria has been negative.  Missed his last nephrology appointment.  -- Nephrology consult, appreciate recs -- Will plan for HD this admission -- Vein mapping per vascular for [facility] placement one INR is stable -- Avoid nephrotoxic meds -- Continue to monitor BMP  Supratheurapeutic INR: On warfarin for stroke prophylaxis in the setting of chronic a-fib. INR 6.9 on admission and increased to 8.2 yesterday. Now s/p 2.5mg  of IV Vit K x 2. No signs of active bleeding at this time. INR 2.65 this morning.  -- Pharmacy consult; appreciate recs -- Continue to hold warfarin  -- Daily PT/INR   Chronic Atrial Fibrillation: Rates well controlled on home regimen -- Continue metoprolol 100 mg AM and 50 mg PM  Hypokalemia: Asymptomatic and improving. Potassium 3.2 this morning.  -- Continue tele -- Continue to replete 40 mg PO KCL BID -- Daily BMP  Anemia: Worsening of his chronic normocytic anemia, likely due to progression of his CKD. -- EPO level pending -- Nephro consult, appreciate recs -- Continue to monitor   DM2: On Lantus 30U daily at home, allergy to Novolog.  -- Lantus 25 U QHS -- Fasting CBGs  Hypothyroidism: -- Continue home synthroid  Hyperlipidemia: -- Continue home statin   Anxiety: -- Continue home diazepam 10 mg Q6h   Chronic pain: On chronic opioids x 15 years per patient. Prescribed fentanyl patch in June, verified with database. Prescribed oxy however patient reports associated nausea and vomitting. -- Fentanyl patch 75 mcg Q72H -- Oxy IR 5 mg Q6 hours prn for breakthrough pain  -- Follow up with PCP   FEN: Fluid  restrict, replete lytes prn, carb modified diet VTE ppx: SCDs, holding warfarin per pharmacy consult, heparin 5000 units TID for ppx Code Status: FULL   Dispo: Anticipated discharge in approximately 3-4 day(s).   Velna Ochs, MD 03/28/2016, 7:34 AM Pager: [phone]

## 2016-03-28 NOTE — Progress Notes (Signed)
TELEMETRY: Reviewed telemetry pt in Atrial fibrillation with controlled rate: Vitals:   03/28/16 0402 03/28/16 0500 03/28/16 0804 03/28/16 0912  BP: (!) 162/91  (!) 150/82   Pulse: (!) 104  (!) 101   Resp: 19     Temp: 97.8 F (36.6 C)  97.7 F (36.5 C)   TempSrc: Oral  Oral   SpO2: 96%  100% 100%  Weight:  286 lb 9.6 oz (130 kg)    Height:        Intake/Output Summary (Last 24 hours) at 03/28/16 1157 Last data filed at 03/28/16 1042  Gross per 24 hour  Intake             1172 ml  Output             3150 ml  Net            -1978 ml   Filed Weights   03/26/16 1158 03/27/16 0500 03/28/16 0500  Weight: 287 lb 12.8 oz (130.5 kg) 286 lb 9.6 oz (130 kg) 286 lb 9.6 oz (130 kg)    Subjective  Breathing better. No chest pain.  . calcium acetate  1,334 mg Oral TID WC  . cholecalciferol  1,000 Units Oral Daily  . [START ON 03/29/2016] darbepoetin (ARANESP) injection - DIALYSIS  100 mcg Intravenous Q Tue-HD  . diazepam  10 mg Oral Q6H  . fentaNYL  75 mcg Transdermal Q72H  . ferric gluconate (FERRLECIT/NULECIT) IV  125 mg Intravenous Daily  . furosemide  120 mg Intravenous Q12H  . hydrALAZINE  25 mg Oral TID  . insulin glargine  30 Units Subcutaneous Q2200  . ipratropium-albuterol  3 mL Nebulization TID  . levofloxacin  500 mg Oral Q48H  . levothyroxine  88 mcg Oral QAC breakfast  . metoprolol  100 mg Oral BID  . multivitamin  1 tablet Oral QHS  . nicotine  14 mg Transdermal Daily  . nitroGLYCERIN  0.5 inch Topical Q6H  . phytonadione  5 mg Oral Once  . pneumococcal 23 valent vaccine  0.5 mL Intramuscular Tomorrow-1000  . potassium chloride  40 mEq Oral BID  . predniSONE  40 mg Oral Q breakfast  . simvastatin  20 mg Oral q1800  . sodium chloride flush  3 mL Intravenous Q12H      LABS: Basic Metabolic Panel:  Recent Labs  03/26/16 1626 03/27/16 0319 03/28/16 0155  NA  --  137 136  K  --  3.1* 3.2*  CL  --  101 98*  CO2  --  19* 21*  GLUCOSE  --  213* 306*  BUN   --  162* 166*  CREATININE  --  6.56* 6.72*  CALCIUM  --  7.2* 7.6*  MG 1.7  --   --   PHOS  --   --  6.4*   Liver Function Tests:  Recent Labs  03/26/16 1215 03/28/16 0155  AST 29  --   ALT 21  --   ALKPHOS 42  --   BILITOT 1.1  --   PROT 6.8  --   ALBUMIN 3.4* 3.1*   No results for input(s): LIPASE, AMYLASE in the last 72 hours. CBC:  Recent Labs  03/26/16 1215 03/27/16 0319 03/28/16 0155  WBC 13.9* 12.9* 11.0*  NEUTROABS 12.1*  --   --   HGB 8.9* 8.8* 8.5*  HCT 27.2* 27.4* 26.3*  MCV 87.5 89.0 88.0  PLT 163 152 146*   Cardiac Enzymes: No results for  input(s): CKTOTAL, CKMB, CKMBINDEX, TROPONINI in the last 72 hours. BNP: No results for input(s): PROBNP in the last 72 hours. D-Dimer: No results for input(s): DDIMER in the last 72 hours. Hemoglobin A1C: No results for input(s): HGBA1C in the last 72 hours. Fasting Lipid Panel: No results for input(s): CHOL, HDL, LDLCALC, TRIG, CHOLHDL, LDLDIRECT in the last 72 hours. Thyroid Function Tests: No results for input(s): TSH, T4TOTAL, T3FREE, THYROIDAB in the last 72 hours.  Invalid input(s): FREET3   Radiology/Studies:  Dg Chest 2 View  Result Date: 03/26/2016 CLINICAL DATA:  Shortness of breath for 6 days. EXAM: CHEST  2 VIEW COMPARISON:  Chest x-rays dated 02/02/2016 and 06/24/2015. FINDINGS: There is stable mild cardiomegaly. Overall cardiomediastinal silhouette is stable in size and configuration. There is central pulmonary vascular congestion and prominent perihilar edema, left greater than right. Additional interstitial edema throughout both lungs. More confluent opacity at the left lung base could be associated alveolar pulmonary edema or superimposed pneumonia. Probable small left pleural effusion. IMPRESSION: 1. Cardiomegaly with pulmonary edema pattern consistent with CHF/volume overload. 2. A more confluent dense opacity at the left lung base (lingula) could be asymmetric alveolar pulmonary edema or  superimposed pneumonia. In the absence of fever, favor asymmetric pulmonary edema. 3. Probable small left pleural effusion. Electronically Signed   By: Franki Cabot M.D.   On: 03/26/2016 12:44    PHYSICAL EXAM General: Well developed, overweight, in no acute distress. Appears chronically ill. Head: Normocephalic, atraumatic, sclera non-icteric, oropharynx is clear Neck: Negative for carotid bruits. JVD not elevated. No adenopathy Lungs: Clear bilaterally to auscultation without wheezes, rales, or rhonchi. Breathing is unlabored. Heart: IRRR S1 S2 without murmurs, rubs, or gallops.  Abdomen: Soft, non-tender, non-distended with normoactive bowel sounds. No hepatomegaly. No rebound/guarding. No obvious abdominal masses. Msk:  Strength and tone appears normal for age. Extremities: 1-2+ pitting edema. Multiple abrasions. Neuro: Alert and oriented X 3. Moves all extremities spontaneously. Psych:  Responds to questions appropriately with a normal affect.  ASSESSMENT AND PLAN: 1. Acute on chronic CHF- most likely diastolic but Echo pending. He is diuresing fairly well considering his advanced renal disease. I/O negative 3 liters. Weight only down one lb. Ultimately dialysis will be needed to maintain volume status.  2. Afib chronic. Rate is well controlled. Coumadin is still therapeutic but on hold for vascular access procedures. 3. Chronic anemia. 4. CKD stage V. Per nephrology.  Present on Admission: . Acute on chronic diastolic CHF (congestive heart failure), NYHA class 3 ([place]) . Atrial fibrillation status post cardioversion ([place]) . COPD exacerbation ([place]) . Hyperlipidemia . CKD (chronic kidney disease), stage V ([place]) . Hypokalemia . Essential hypertension . GERD (gastroesophageal reflux disease)   Signed, Kelen Laura [place], [place] 03/28/2016 11:57 AM

## 2016-03-28 NOTE — Progress Notes (Signed)
Orthopedic Tech Progress Note Patient Details:  Johnny Sims 05-09-1956 [id number]  Ortho Devices Type of Ortho Device: Ace wrap, Unna boot Ortho Device/Splint Location: Bilateral unna boots Ortho Device/Splint Interventions: Application   [place] Pink 03/28/2016, 3:05 PM

## 2016-03-28 NOTE — Consult Note (Signed)
[place] Nurse wound consult note Reason for Consult: Bilateral lower leg swelling.  Sees Dr Sharol Given who has prescribed compression, and Arville Go was coming to his home to assist with compression.   Partial thickness abrasions to right anterior lower leg, right lateral malleolus Left anterior lower leg below knee.   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  Wound type: venous insufficiency.  Pressure Ulcer POA: N/A Measurement:Left anterior leg 1 cm x 0.2 cm scabbed abrasion Right anterior lower leg: 0.5 cm diameter denuded abrasion Right lateral malleolus 1 cm x 0.5 cm x 0.1 cm ulcer.   Wound TD:[id number] red Drainage (amount, consistency, odor)scant serosanguinous  No odor.   Periwound:Chronic skin changes.  Hemosiderin staining is present.  Dressing procedure/placement/frequency:Weekly Unnas boots ordered.  Compression should help alleviate edema. Patient wears compression at home. HAS difficulty with compression garments because he wears a size 15 shoe.  Will not follow at this time.  Please re-consult if needed.  Domenic Moras RN BSN [place] Pager [phone]

## 2016-03-28 NOTE — Progress Notes (Signed)
   Daily Progress Note   Lab Results  Component Value Date   INR 2.65 03/28/2016   INR 8.20 (HH) 03/27/2016   INR 6.94 (HH) 03/26/2016    Will schedule for tomorrow for [facility] placement and L 1st stage BVT  Risk, benefits, and alternatives to access surgery were discussed.    The patient is aware the risks include but are not limited to: bleeding, infection, steal syndrome, nerve damage, ischemic monomelic neuropathy, failure to mature, need for additional procedures, death and stroke.   The patient is aware the risks of tunneled dialysis catheter placement include but are not limited to: bleeding, infection, central venous injury, pneumothorax, possible venous stenosis, possible malpositioning in the venous system, and possible infections related to long-term catheter presence.     Adele Barthel, MD, FACS [facility] Office: [phone] Pager: [phone]  03/28/2016, 3:34 PM

## 2016-03-28 NOTE — Progress Notes (Signed)
  Echocardiogram 2D Echocardiogram has been performed.  Johny Chess 03/28/2016, 4:34 PM

## 2016-03-28 NOTE — Progress Notes (Signed)
Upper Extremity Vein Map    Right Cephalic  Segment Diameter Depth Comment  1. Axilla 4.48mm 10.87mm   2. Mid upper arm 2.29mm 4.76mm   3. Above [facility] 2.32mm 3.36mm   4. In [facility] 2.31mm 3.107mm   5. Below AC 3.21mm 6.63mm   6. Mid forearm 3.21mm 2.22mm   7. Wrist 3.62mm 2.31mm    mm mm    mm mm    mm mm    Right Basilic  Segment Diameter Depth Comment  1. Pr upper arm 6.87mm 23mm   2. Mid upper arm 4.66mm 15.65mm   3. Above [facility] 4.59mm 21.68mm branch  4. In [facility] 4.20mm 4.56mm branch  5. Below AC mm mm   6. Mid forearm mm mm   7. Wrist mm mm    mm mm    mm mm    mm mm     Left Cephalic  Segment Diameter Depth Comment  1. Axilla mm mm   2. Mid upper arm 72mm mm   3. Above AC 1.41mm mm   4. In AC 1.37mm mm   5. Below AC mm mm   6. Mid forearm mm mm   7. Wrist mm mm    mm mm    mm mm    mm mm     Left Basilic  Segment Diameter Depth Comment  1. Axilla mm mm   2. Mid upper arm 4.89mm 11.22mm   3. Above AC 4.59mm 10.41mm   4. In [facility] 5.48mm 3.9mm   5. Below AC 4.43mm 3.60mm branch  6. Mid forearm mm mm   7. Wrist mm mm    mm mm    mm mm    mm mm     Landry Mellow, RDMS, RVT  03/28/2016

## 2016-03-28 NOTE — Progress Notes (Addendum)
Inpatient Diabetes Program Recommendations  AACE/ADA: New Consensus Statement on Inpatient Glycemic Control (2015)  Target Ranges:  Prepandial:   less than 140 mg/dL      Peak postprandial:   less than 180 mg/dL (1-2 hours)      Critically ill patients:  140 - 180 mg/dL   Lab Results  Component Value Date   GLUCAP 256 (H) 03/28/2016   HGBA1C 7.0 (H) 06/23/2015    Review of Glycemic Control  Diabetes history: DM2 Outpatient Diabetes medications: Lantus 30 units daily Current orders for Inpatient glycemic control: Lantus 30 units daily  Inpatient Diabetes Program Recommendations:    Noted patient allergic to Novolog. Spoke with patient and patient states he cannot take regular insulin either but unable to answer what type of allergy.  Thank you, Nani Gasser. Cleophas Yoak, RN, MSN, CDE Inpatient Glycemic Control Team Team Pager [phone] (8am-5pm) 03/28/2016 10:42 AM

## 2016-03-28 NOTE — Progress Notes (Signed)
Patient states allergic reaction to Novolog is nausea & vomiting.

## 2016-03-28 NOTE — Progress Notes (Signed)
Subjective:  Is making urine with diuretics VVS is planning on PC hopefully today/tomorrow- INR is down quite a bit but still over 2  Objective Vital signs in last 24 hours: Vitals:   03/28/16 0238 03/28/16 0402 03/28/16 0500 03/28/16 0804  BP:  (!) 162/91    Pulse:  (!) 104  (!) 101  Resp:  19    Temp:  97.8 F (36.6 C)  97.7 F (36.5 C)  TempSrc:  Oral  Oral  SpO2: 98% 96%    Weight:   130 kg (286 lb 9.6 oz)   Height:       Weight change: -0.545 kg (-1 lb 3.2 oz)  Intake/Output Summary (Last 24 hours) at 03/28/16 0855 Last data filed at 03/28/16 0800  Gross per 24 hour  Intake              562 ml  Output             3150 ml  Net            -2588 ml    Assessment/ Plan: Pt is a 60 y.o. yo male who was admitted on 03/26/2016 with progressive renal failure- now requiring initiation of dialysis   Assessment/Plan: 1. Renal- basically progressive renal failure likely secondary to DM vs hep C- now uremic and requiring initiation of HD- he seems to understand but will continue to need education about what this means for him.  When able to have PC and access placed followed by first HD treatment- CLIP for OP dialysis arrangements.  Will do 4 K bath- also on K repletion - will slow down K repletion since no access right now and K is above 3  2. HTN/vol- overloaded but diuresing so far- continue diuretics until able to do dialysis  - also still on hydralazine and lopressor- will likely be able to wen once gets established with HD 3. Anemia- hgb in the 8's  iron stores low- replete and add ESA  4. Secondary hyperparathyroidism- phos 6.4- no recent PTH- will add binder and check PTH    Eveleen Mcnear A    Labs: Basic Metabolic Panel:  Recent Labs Lab 03/26/16 1215 03/27/16 0319 03/28/16 0155  NA 132* 137 136  K 2.9* 3.1* 3.2*  CL 98* 101 98*  CO2 16* 19* 21*  GLUCOSE 238* 213* 306*  BUN 161* 162* 166*  CREATININE 6.68* 6.56* 6.72*  CALCIUM 7.0* 7.2* 7.6*  PHOS  --   --   6.4*   Liver Function Tests:  Recent Labs Lab 03/26/16 1215 03/28/16 0155  AST 29  --   ALT 21  --   ALKPHOS 42  --   BILITOT 1.1  --   PROT 6.8  --   ALBUMIN 3.4* 3.1*   No results for input(s): LIPASE, AMYLASE in the last 168 hours. No results for input(s): AMMONIA in the last 168 hours. CBC:  Recent Labs Lab 03/26/16 1215 03/27/16 0319 03/28/16 0155  WBC 13.9* 12.9* 11.0*  NEUTROABS 12.1*  --   --   HGB 8.9* 8.8* 8.5*  HCT 27.2* 27.4* 26.3*  MCV 87.5 89.0 88.0  PLT 163 152 146*   Cardiac Enzymes: No results for input(s): CKTOTAL, CKMB, CKMBINDEX, TROPONINI in the last 168 hours. CBG:  Recent Labs Lab 03/27/16 0432 03/27/16 0825 03/27/16 1358 03/27/16 1715 03/27/16 2225  GLUCAP 263* 270* 305* 313* 360*    Iron Studies:  Recent Labs  03/27/16 1230  IRON 32*  TIBC 332  Studies/Results: Dg Chest 2 View  Result Date: 03/26/2016 CLINICAL DATA:  Shortness of breath for 6 days. EXAM: CHEST  2 VIEW COMPARISON:  Chest x-rays dated 02/02/2016 and 06/24/2015. FINDINGS: There is stable mild cardiomegaly. Overall cardiomediastinal silhouette is stable in size and configuration. There is central pulmonary vascular congestion and prominent perihilar edema, left greater than right. Additional interstitial edema throughout both lungs. More confluent opacity at the left lung base could be associated alveolar pulmonary edema or superimposed pneumonia. Probable small left pleural effusion. IMPRESSION: 1. Cardiomegaly with pulmonary edema pattern consistent with CHF/volume overload. 2. A more confluent dense opacity at the left lung base (lingula) could be asymmetric alveolar pulmonary edema or superimposed pneumonia. In the absence of fever, favor asymmetric pulmonary edema. 3. Probable small left pleural effusion. Electronically Signed   By: Franki Cabot M.D.   On: 03/26/2016 12:44   Medications: Infusions:    Scheduled Medications: . calcium acetate  667 mg Oral TID  WC  . cholecalciferol  1,000 Units Oral Daily  . diazepam  10 mg Oral Q6H  . fentaNYL  75 mcg Transdermal Q72H  . furosemide  120 mg Intravenous Q12H  . hydrALAZINE  25 mg Oral TID  . Influenza vac split quadrivalent PF  0.5 mL Intramuscular Tomorrow-1000  . insulin glargine  30 Units Subcutaneous Q2200  . ipratropium-albuterol  3 mL Nebulization TID  . levofloxacin (LEVAQUIN) IV  500 mg Intravenous Q48H  . levothyroxine  88 mcg Oral QAC breakfast  . metoprolol  100 mg Oral BID  . multivitamin  1 tablet Oral QHS  . nicotine  14 mg Transdermal Daily  . nitroGLYCERIN  0.5 inch Topical Q6H  . pneumococcal 23 valent vaccine  0.5 mL Intramuscular Tomorrow-1000  . potassium chloride  40 mEq Oral TID  . predniSONE  40 mg Oral Q breakfast  . simvastatin  20 mg Oral q1800  . sodium chloride flush  3 mL Intravenous Q12H    have reviewed scheduled and prn medications.  Physical Exam: General: alert, talkative Heart: tachy Lungs: dec BS at bases Abdomen: obese, soft Extremities: positive edema with bruising Dialysis Access: none yet     03/28/2016,8:55 AM  LOS: 2 days

## 2016-03-29 ENCOUNTER — Encounter (HOSPITAL_COMMUNITY)
Admission: EM | Disposition: A | Discharge status: Home / Self Care | Payer: Self-pay | Source: Home / Self Care | Attending: Internal Medicine

## 2016-03-29 ENCOUNTER — Inpatient Hospital Stay (HOSPITAL_COMMUNITY): Payer: Medicaid Other | Admitting: Anesthesiology

## 2016-03-29 ENCOUNTER — Inpatient Hospital Stay (HOSPITAL_COMMUNITY): Payer: Medicaid Other

## 2016-03-29 ENCOUNTER — Telehealth: Payer: Self-pay | Admitting: Vascular Surgery

## 2016-03-29 ENCOUNTER — Other Ambulatory Visit: Payer: Self-pay | Admitting: *Deleted

## 2016-03-29 ENCOUNTER — Encounter (HOSPITAL_COMMUNITY): Payer: Self-pay | Admitting: Anesthesiology

## 2016-03-29 DIAGNOSIS — Z4931 Encounter for adequacy testing for hemodialysis: Secondary | ICD-10-CM

## 2016-03-29 DIAGNOSIS — N186 End stage renal disease: Secondary | ICD-10-CM

## 2016-03-29 HISTORY — PX: INSERTION OF DIALYSIS CATHETER: SHX1324

## 2016-03-29 HISTORY — PX: BASCILIC VEIN TRANSPOSITION: SHX5742

## 2016-03-29 LAB — CBC
HCT: 26.3 % — ABNORMAL LOW (ref 39.0–52.0)
Hemoglobin: 8.6 g/dL — ABNORMAL LOW (ref 13.0–17.0)
MCH: 28.8 pg (ref 26.0–34.0)
MCHC: 32.7 g/dL (ref 30.0–36.0)
MCV: 88 fL (ref 78.0–100.0)
PLATELETS: 150 10*3/uL (ref 150–400)
RBC: 2.99 MIL/uL — AB (ref 4.22–5.81)
RDW: 19.4 % — AB (ref 11.5–15.5)
WBC: 16.1 10*3/uL — AB (ref 4.0–10.5)

## 2016-03-29 LAB — GLUCOSE, CAPILLARY
GLUCOSE-CAPILLARY: 311 mg/dL — AB (ref 65–99)
GLUCOSE-CAPILLARY: 225 mg/dL — AB (ref 65–99)
Glucose-Capillary: 146 mg/dL — ABNORMAL HIGH (ref 65–99)
Glucose-Capillary: 189 mg/dL — ABNORMAL HIGH (ref 65–99)
Glucose-Capillary: 254 mg/dL — ABNORMAL HIGH (ref 65–99)

## 2016-03-29 LAB — BASIC METABOLIC PANEL
Anion gap: 16 — ABNORMAL HIGH (ref 5–15)
BUN: 180 mg/dL — AB (ref 6–20)
CALCIUM: 8 mg/dL — AB (ref 8.9–10.3)
CHLORIDE: 95 mmol/L — AB (ref 101–111)
CO2: 20 mmol/L — ABNORMAL LOW (ref 22–32)
CREATININE: 6.64 mg/dL — AB (ref 0.61–1.24)
GFR calc non Af Amer: 8 mL/min — ABNORMAL LOW (ref >60)
GFR, EST AFRICAN AMERICAN: 9 mL/min — AB (ref >60)
Glucose, Bld: 338 mg/dL — ABNORMAL HIGH (ref 65–99)
Potassium: 3.5 mmol/L (ref 3.5–5.1)
SODIUM: 131 mmol/L — AB (ref 135–145)

## 2016-03-29 LAB — PROTIME-INR
INR: 1.42
PROTHROMBIN TIME: 17.5 s — AB (ref 11.4–15.2)

## 2016-03-29 SURGERY — INSERTION OF DIALYSIS CATHETER
Anesthesia: General | Site: Neck | Laterality: Right

## 2016-03-29 MED ORDER — MIDAZOLAM HCL 2 MG/2ML IJ SOLN
INTRAMUSCULAR | Status: AC
  Filled 2016-03-29: qty 2

## 2016-03-29 MED ORDER — GLYCOPYRROLATE 0.2 MG/ML IJ SOLN
INTRAMUSCULAR | Status: DC | PRN
  Administered 2016-03-29: 0.4 mg via INTRAVENOUS

## 2016-03-29 MED ORDER — SUCCINYLCHOLINE CHLORIDE 20 MG/ML IJ SOLN
INTRAMUSCULAR | Status: DC | PRN
  Administered 2016-03-29: 100 mg via INTRAVENOUS

## 2016-03-29 MED ORDER — DEXTROSE 5 % IV SOLN
INTRAVENOUS | Status: AC
  Filled 2016-03-29: qty 1.5

## 2016-03-29 MED ORDER — DEXTROSE 5 % IV SOLN
INTRAVENOUS | Status: DC | PRN
  Administered 2016-03-29: 1.5 g via INTRAVENOUS

## 2016-03-29 MED ORDER — SODIUM CHLORIDE 0.9 % IV SOLN
INTRAVENOUS | Status: DC
  Administered 2016-03-29: 10:00:00 via INTRAVENOUS

## 2016-03-29 MED ORDER — INSULIN GLARGINE 100 UNIT/ML ~~LOC~~ SOLN
40.0000 [IU] | Freq: Every day | SUBCUTANEOUS | Status: DC
  Administered 2016-03-29 – 2016-03-31 (×3): 40 [IU] via SUBCUTANEOUS
  Filled 2016-03-29 (×4): qty 0.4

## 2016-03-29 MED ORDER — IOPAMIDOL (ISOVUE-300) INJECTION 61%
INTRAVENOUS | Status: AC
  Filled 2016-03-29: qty 50

## 2016-03-29 MED ORDER — HEMOSTATIC AGENTS (NO CHARGE) OPTIME
TOPICAL | Status: DC | PRN
  Administered 2016-03-29: 1 via TOPICAL

## 2016-03-29 MED ORDER — MIDAZOLAM HCL 5 MG/5ML IJ SOLN: INTRAMUSCULAR | Status: DC | PRN

## 2016-03-29 MED ORDER — LIDOCAINE-EPINEPHRINE (PF) 1 %-1:200000 IJ SOLN
INTRAMUSCULAR | Status: AC
  Filled 2016-03-29: qty 30

## 2016-03-29 MED ORDER — LIDOCAINE HCL (PF) 1 % IJ SOLN: 5.0000 mL | INTRAMUSCULAR | Status: DC | PRN

## 2016-03-29 MED ORDER — NEOSTIGMINE METHYLSULFATE 10 MG/10ML IV SOLN
INTRAVENOUS | Status: DC | PRN
  Administered 2016-03-29: 3 mg via INTRAVENOUS

## 2016-03-29 MED ORDER — PROMETHAZINE HCL 25 MG/ML IJ SOLN: 6.2500 mg | INTRAMUSCULAR | Status: DC | PRN

## 2016-03-29 MED ORDER — FENTANYL CITRATE (PF) 100 MCG/2ML IJ SOLN
INTRAMUSCULAR | Status: DC | PRN
  Administered 2016-03-29: 100 ug via INTRAVENOUS

## 2016-03-29 MED ORDER — LIDOCAINE-PRILOCAINE 2.5-2.5 % EX CREA: 1.0000 "application " | TOPICAL_CREAM | CUTANEOUS | Status: DC | PRN

## 2016-03-29 MED ORDER — INSULIN ASPART 100 UNIT/ML ~~LOC~~ SOLN
0.0000 [IU] | Freq: Three times a day (TID) | SUBCUTANEOUS | Status: DC
  Administered 2016-03-29 – 2016-03-30 (×2): 3 [IU] via SUBCUTANEOUS
  Administered 2016-03-30: 8 [IU] via SUBCUTANEOUS
  Administered 2016-03-31: 5 [IU] via SUBCUTANEOUS
  Administered 2016-03-31: 8 [IU] via SUBCUTANEOUS
  Administered 2016-04-01: 5 [IU] via SUBCUTANEOUS
  Administered 2016-04-01: 8 [IU] via SUBCUTANEOUS
  Administered 2016-04-01: 5 [IU] via SUBCUTANEOUS
  Administered 2016-04-02: 8 [IU] via SUBCUTANEOUS

## 2016-03-29 MED ORDER — ROCURONIUM BROMIDE 100 MG/10ML IV SOLN
INTRAVENOUS | Status: DC | PRN
  Administered 2016-03-29: 25 mg via INTRAVENOUS

## 2016-03-29 MED ORDER — ONDANSETRON HCL 4 MG PO TABS
4.0000 mg | ORAL_TABLET | Freq: Once | ORAL | Status: AC
  Administered 2016-03-29: 4 mg via ORAL
  Filled 2016-03-29: qty 1

## 2016-03-29 MED ORDER — HYDROMORPHONE HCL 1 MG/ML IJ SOLN: 0.2500 mg | INTRAMUSCULAR | Status: DC | PRN

## 2016-03-29 MED ORDER — INSULIN ASPART 100 UNIT/ML ~~LOC~~ SOLN
10.0000 [IU] | Freq: Once | SUBCUTANEOUS | Status: AC
Start: 2016-03-29 — End: 2016-03-29
  Administered 2016-03-29: 10 [IU] via SUBCUTANEOUS

## 2016-03-29 MED ORDER — LIDOCAINE HCL (CARDIAC) 20 MG/ML IV SOLN: INTRAVENOUS | Status: DC | PRN

## 2016-03-29 MED ORDER — HEPARIN SODIUM (PORCINE) 1000 UNIT/ML IJ SOLN
INTRAMUSCULAR | Status: AC
  Filled 2016-03-29: qty 1

## 2016-03-29 MED ORDER — SODIUM CHLORIDE 0.9 % IV SOLN
INTRAVENOUS | Status: DC | PRN
  Administered 2016-03-29: 11:00:00

## 2016-03-29 MED ORDER — SODIUM CHLORIDE 0.9 % IV SOLN: 100.0000 mL | INTRAVENOUS | Status: DC | PRN

## 2016-03-29 MED ORDER — PROPOFOL 10 MG/ML IV BOLUS
INTRAVENOUS | Status: AC
  Filled 2016-03-29: qty 20

## 2016-03-29 MED ORDER — PROPOFOL 10 MG/ML IV BOLUS
INTRAVENOUS | Status: DC | PRN
  Administered 2016-03-29: 200 mg via INTRAVENOUS

## 2016-03-29 MED ORDER — HYDROMORPHONE HCL 2 MG PO TABS
ORAL_TABLET | ORAL | Status: AC
  Administered 2016-03-29: 1 mg via ORAL
  Filled 2016-03-29: qty 1

## 2016-03-29 MED ORDER — PANTOPRAZOLE SODIUM 40 MG PO TBEC
40.0000 mg | DELAYED_RELEASE_TABLET | Freq: Once | ORAL | Status: AC
  Administered 2016-03-29: 40 mg via ORAL
  Filled 2016-03-29: qty 1

## 2016-03-29 MED ORDER — INSULIN ASPART 100 UNIT/ML ~~LOC~~ SOLN
0.0000 [IU] | Freq: Every day | SUBCUTANEOUS | Status: DC
  Administered 2016-03-29: 3 [IU] via SUBCUTANEOUS
  Administered 2016-03-30: 2 [IU] via SUBCUTANEOUS
  Administered 2016-03-31: 5 [IU] via SUBCUTANEOUS
  Administered 2016-04-01: 2 [IU] via SUBCUTANEOUS

## 2016-03-29 MED ORDER — HEPARIN SODIUM (PORCINE) 1000 UNIT/ML DIALYSIS: 1000.0000 [IU] | INTRAMUSCULAR | Status: DC | PRN

## 2016-03-29 MED ORDER — DARBEPOETIN ALFA 100 MCG/0.5ML IJ SOSY
PREFILLED_SYRINGE | INTRAMUSCULAR | Status: AC
  Administered 2016-03-29: 100 ug via INTRAVENOUS
  Filled 2016-03-29: qty 0.5

## 2016-03-29 MED ORDER — FENTANYL CITRATE (PF) 100 MCG/2ML IJ SOLN
INTRAMUSCULAR | Status: AC
  Filled 2016-03-29: qty 2

## 2016-03-29 MED ORDER — 0.9 % SODIUM CHLORIDE (POUR BTL) OPTIME
TOPICAL | Status: DC | PRN
  Administered 2016-03-29: 1000 mL

## 2016-03-29 MED ORDER — ALTEPLASE 2 MG IJ SOLR: 2.0000 mg | Freq: Once | INTRAMUSCULAR | Status: DC | PRN

## 2016-03-29 MED ORDER — HYDROMORPHONE HCL 2 MG PO TABS
1.0000 mg | ORAL_TABLET | ORAL | Status: DC | PRN
  Administered 2016-03-29 – 2016-03-30 (×2): 1 mg via ORAL
  Filled 2016-03-29 (×2): qty 1

## 2016-03-29 MED ORDER — HEPARIN SODIUM (PORCINE) 1000 UNIT/ML IJ SOLN
INTRAMUSCULAR | Status: DC | PRN
  Administered 2016-03-29: 3 mL

## 2016-03-29 MED ORDER — PENTAFLUOROPROP-TETRAFLUOROETH EX AERO: 1.0000 "application " | INHALATION_SPRAY | CUTANEOUS | Status: DC | PRN

## 2016-03-29 SURGICAL SUPPLY — 53 items
ARMBAND PINK RESTRICT EXTREMIT (MISCELLANEOUS) ×4 IMPLANT
BAG DECANTER FOR FLEXI CONT (MISCELLANEOUS) ×4 IMPLANT
BIOPATCH RED 1 DISK 7.0 (GAUZE/BANDAGES/DRESSINGS) ×3 IMPLANT
BIOPATCH RED 1IN DISK 7.0MM (GAUZE/BANDAGES/DRESSINGS) ×1
CANISTER SUCTION 2500CC (MISCELLANEOUS) ×4 IMPLANT
CATH PALINDROME RT-P 15FX19CM (CATHETERS) ×2 IMPLANT
CATH PALINDROME RT-P 15FX23CM (CATHETERS) IMPLANT
CATH PALINDROME RT-P 15FX28CM (CATHETERS) IMPLANT
CATH PALINDROME RT-P 15FX55CM (CATHETERS) IMPLANT
CLIP TI MEDIUM 24 (CLIP) IMPLANT
CLIP TI MEDIUM 6 (CLIP) IMPLANT
CLIP TI WIDE RED SMALL 24 (CLIP) IMPLANT
CLIP TI WIDE RED SMALL 6 (CLIP) IMPLANT
COVER PROBE W GEL 5X96 (DRAPES) ×4 IMPLANT
COVER SURGICAL LIGHT HANDLE (MISCELLANEOUS) ×4 IMPLANT
DRAPE C-ARM 42X72 X-RAY (DRAPES) ×4 IMPLANT
DRAPE CHEST BREAST 15X10 FENES (DRAPES) ×4 IMPLANT
DRSG TEGADERM 4X4.75 (GAUZE/BANDAGES/DRESSINGS) ×2 IMPLANT
ELECT REM PT RETURN 9FT ADLT (ELECTROSURGICAL) ×4
ELECTRODE REM PT RTRN 9FT ADLT (ELECTROSURGICAL) ×2 IMPLANT
GAUZE SPONGE 2X2 8PLY STRL LF (GAUZE/BANDAGES/DRESSINGS) IMPLANT
GAUZE SPONGE 4X4 16PLY XRAY LF (GAUZE/BANDAGES/DRESSINGS) ×6 IMPLANT
GLOVE BIO SURGEON STRL SZ7.5 (GLOVE) ×4 IMPLANT
GOWN STRL REUS W/ TWL LRG LVL3 (GOWN DISPOSABLE) ×4 IMPLANT
GOWN STRL REUS W/ TWL XL LVL3 (GOWN DISPOSABLE) ×2 IMPLANT
GOWN STRL REUS W/TWL LRG LVL3 (GOWN DISPOSABLE) ×8
GOWN STRL REUS W/TWL XL LVL3 (GOWN DISPOSABLE) ×4
KIT BASIN OR (CUSTOM PROCEDURE TRAY) ×4 IMPLANT
KIT ROOM TURNOVER OR (KITS) ×4 IMPLANT
LIQUID BAND (GAUZE/BANDAGES/DRESSINGS) ×4 IMPLANT
NDL 18GX1X1/2 (RX/OR ONLY) (NEEDLE) ×2 IMPLANT
NDL HYPO 25GX1X1/2 BEV (NEEDLE) ×2 IMPLANT
NEEDLE 18GX1X1/2 (RX/OR ONLY) (NEEDLE) ×4 IMPLANT
NEEDLE HYPO 25GX1X1/2 BEV (NEEDLE) ×4 IMPLANT
NS IRRIG 1000ML POUR BTL (IV SOLUTION) ×4 IMPLANT
PACK CV ACCESS (CUSTOM PROCEDURE TRAY) ×4 IMPLANT
PACK SURGICAL SETUP 50X90 (CUSTOM PROCEDURE TRAY) ×4 IMPLANT
PAD ARMBOARD 7.5X6 YLW CONV (MISCELLANEOUS) ×8 IMPLANT
SOAP 2 % CHG 4 OZ (WOUND CARE) ×4 IMPLANT
SPONGE GAUZE 2X2 STER 10/PKG (GAUZE/BANDAGES/DRESSINGS)
SPONGE GAUZE 4X4 12PLY STER LF (GAUZE/BANDAGES/DRESSINGS) ×2 IMPLANT
SUT ETHILON 3 0 PS 1 (SUTURE) ×4 IMPLANT
SUT MNCRL AB 4-0 PS2 18 (SUTURE) ×4 IMPLANT
SUT PROLENE 6 0 BV (SUTURE) ×4 IMPLANT
SUT SILK 2 0 SH (SUTURE) IMPLANT
SUT VIC AB 3-0 SH 27 (SUTURE) ×4
SUT VIC AB 3-0 SH 27X BRD (SUTURE) ×2 IMPLANT
SYR 20CC LL (SYRINGE) ×8 IMPLANT
SYR 5ML LL (SYRINGE) ×4 IMPLANT
SYR CONTROL 10ML LL (SYRINGE) ×4 IMPLANT
SYRINGE 10CC LL (SYRINGE) ×4 IMPLANT
UNDERPAD 30X30 (UNDERPADS AND DIAPERS) ×4 IMPLANT
WATER STERILE IRR 1000ML POUR (IV SOLUTION) ×4 IMPLANT

## 2016-03-29 NOTE — Anesthesia Procedure Notes (Signed)
Procedure Name: Intubation Date/Time: 03/29/2016 10:05 AM Performed by: Scheryl Darter Pre-anesthesia Checklist: Patient identified, Emergency Drugs available, Suction available and Patient being monitored Patient Re-evaluated:Patient Re-evaluated prior to inductionOxygen Delivery Method: Circle System Utilized Preoxygenation: Pre-oxygenation with 100% oxygen Intubation Type: IV induction Ventilation: Mask ventilation without difficulty Laryngoscope Size: Miller and 3 Grade View: Grade II Tube type: Oral Tube size: 8.0 mm Number of attempts: 1 Airway Equipment and Method: Stylet and Oral airway Placement Confirmation: ETT inserted through vocal cords under direct vision,  positive ETCO2 and breath sounds checked- equal and bilateral Secured at: 23 cm Tube secured with: Tape Dental Injury: Teeth and Oropharynx as per pre-operative assessment  Comments: Poor dentition undisturbed

## 2016-03-29 NOTE — Telephone Encounter (Signed)
-----   Message from Mena Goes, RN sent at 03/29/2016 11:44 AM EDT ----- Regarding: schedule   ----- Message ----- From: Ulyses Amor, PA-C Sent: 03/29/2016  11:26 AM To: Vvs Charge Pool  F/U with Dr. Donzetta Matters in 4-6 weeks with fistula duplex s/p first stage basilic fistula creation left

## 2016-03-29 NOTE — Transfer of Care (Signed)
Immediate Anesthesia Transfer of Care Note  Patient: Johnny Sims  Procedure(s) Performed: Procedure(s): INSERTION OF RIGHT Internal Jugular DIALYSIS CATHETER (Right) LEFT FIRST STAGE BASILIC VEIN TRANSPOSITION (Left)  Patient Location: PACU  Anesthesia Type:General  Level of Consciousness: awake, alert , oriented and sedated  Airway & Oxygen Therapy: Patient Spontanous Breathing and Patient connected to nasal cannula oxygen  Post-op Assessment: Report given to RN, Post -op Vital signs reviewed and stable and Patient moving all extremities  Post vital signs: Reviewed and stable  Last Vitals:  Vitals:   03/29/16 0817 03/29/16 1141  BP: (!) 154/103   Pulse: 91   Resp: 11   Temp:  36.2 C    Last Pain:  Vitals:   03/29/16 0857  TempSrc:   PainSc: Asleep      Patients Stated Pain Goal: 0 ([id number])  Complications: No apparent anesthesia complications

## 2016-03-29 NOTE — Progress Notes (Signed)
Subjective: Patient feels well and says his breathing continues to improve, however cough is worse this morning. Improves with breathing treatments. Denies chest pain. Continues to have good urine output.   Objective:  Vital signs in last 24 hours: Vitals:   03/29/16 0435 03/29/16 0751 03/29/16 0758 03/29/16 0817  BP: (!) 149/99 (!) 149/105  (!) 154/103  Pulse:    91  Resp: 16   11  Temp: 97.9 F (36.6 C) 98.2 F (36.8 C)    TempSrc: Oral Oral    SpO2: 98%  98% 99%  Weight:      Height:        Intake/Output Summary (Last 24 hours) at 03/29/16 1055 Last data filed at 03/29/16 1009  Gross per 24 hour  Intake             1452 ml  Output             2800 ml  Net            -1348 ml   Physical Exam Constitutional: NAD, appears comfortable HEENT: Atraumatic, normocephalic. anicteric sclera.  Cardiovascular:RRR, no murmurs, rubs, or gallops.  Pulmonary/Chest:Mild expiratory wheezing, bibasilar crackles, breathing comfortably on room air Abdominal:Soft, non tender, non distended. +BS.  Extremities: Warm and well perfused. Distal pulses intact. 2+ pitting edema to bilateral knees with chronic overlying skin changes of discoloration and dermatitis. 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  Skin: Small black cirrcular lesion at previous biopsy site on mid back with surrounding scarring.  Psychiatric:Normal mood and affect  Assessment/Plan:  Acute on Chronic QT:[id number] of HFpEF, last ECHO in May 2016, LV EF 55 - 60%. CXR on admission with pulmonary edema. Diuresing well with 3.2 L output overnight. Breathing has improved. Weight unchanged since admission. Repeat ECHO yesterday with mild systolic dysfunction, EF [id number], no wall motion abnormalities.  -- Continue Lasix 120 mg IV BID -- Home metoprolol increased yesterday to 100 mg BID -- Continue home hydralazine 25 mg TID -- Cards following; appreciate recs    COPD Exacerbation: Cough and diffuse wheezing on admission improving with breathing treatments. CXR with left lower lobe consolidations concerning for PNA. Clinically improving.  -- Continue Duonebs q6h -- Prednisone 40mg  and PO Levofloxacin 500 mg Q48 hours to complete 5 days   Acute on Chronic Renal Failure: Progressively worsening renal function over the past few years, now with creatinine >6. He continues to have good UOP and is diuresing well with IV lasix. He is scheduled for outpatient nephrology follow up. Previous work up for glomerulonephritis in the setting of chronic hematuria and proteinuria has been negative. Missed his last nephrology appointment.  -- Nephrology following, appreciate recs -- Will plan for HD this admission -- [facility] placement today per vascular -- Avoid nephrotoxic meds -- Continue to monitor BMP  Supratheurapeutic INR: Reversed. On warfarin for stroke prophylaxis in the setting of chronic a-fib. INR 6.9 on admission and increased to 8.2 the following day. Now s/p 2.5mg  of IV Vit K x 2. No signs of active bleeding at this time. INR 1.4 this morning.  -- Pharmacy consult; appreciate recs -- Continue to hold warfarin  -- Daily PT/INR   Chronic Atrial Fibrillation: Patient has been intermittently tachycardic to 120s however this is likely due to his duoneb treatments. Rate well controlled this morning on evaluation. Will continue to monitor.  -- Home metoprolol  Increased yesterday to 100 mg BID  Hypokalemia:  Asymptomatic and improving. Potassium 3.5 this morning.  -- Continue tele -- Replete PRN  -- Daily BMP  Anemia: Worsening of his chronic normocytic anemia, likely due to progression of his CKD. -- EPO level 5.6 -- Nephro consult, appreciate recs -- Continue to monitor   DM2: On Lantus 30U daily at home. Patient reports allergy to Novolog consistent with vomiting. CBGs have been elevated to 300-400 range during this admission, 311 this morning.  Will give 10 units of Lantus this morning in preparation for surgery and prophylax with zofran.  -- Lantus 35 U QHS -- Will start SSI with meals and QHS and monitor closely while in controlled setting -- Fasting CBGs  Hypothyroidism: -- Continue home synthroid  Hyperlipidemia: -- Continue home statin   Anxiety: -- Continue home diazepam 10 mg Q6h   Chronic pain:On chronic opioids x 15 years per patient. Prescribed fentanyl patch in June, verified with database. Prescribed oxy however patient reports associated nausea and vomitting. -- Fentanyl patch 75 mcg Q72H -- Dilaudid 1 mg q8hrs prn for severe pain  -- Follow up with PCP   FEN: Fluid restrict, replete lytes prn, NPO for [facility] placement VTE ppx: SCDs, holding warfarin per pharmacy consult, heparin 5000 units TID for ppx Code Status: FULL   Dispo: Anticipated discharge in approximately 3-4 day(s).   Velna Ochs, MD 03/29/2016, 10:53 AM Pager: [phone]

## 2016-03-29 NOTE — Anesthesia Postprocedure Evaluation (Signed)
Anesthesia Post Note  Patient: Johnny Sims  Procedure(s) Performed: Procedure(s) (LRB): INSERTION OF RIGHT Internal Jugular DIALYSIS CATHETER (Right) LEFT FIRST STAGE BASILIC VEIN TRANSPOSITION (Left)  Patient location during evaluation: PACU Anesthesia Type: General Level of consciousness: awake and alert Pain management: pain level controlled Vital Signs Assessment: post-procedure vital signs reviewed and stable Respiratory status: spontaneous breathing, nonlabored ventilation, respiratory function stable and patient connected to nasal cannula oxygen Cardiovascular status: blood pressure returned to baseline and stable Postop Assessment: no signs of nausea or vomiting Anesthetic complications: no    Last Vitals:  Vitals:   03/29/16 1430 03/29/16 1500  BP: (!) 150/84 (!) 156/112  Pulse: 79 (!) 107  Resp: 10 (!) 26  Temp:      Last Pain:  Vitals:   03/29/16 1230  TempSrc: Oral  PainSc: 0-No pain                 Tiajuana Amass

## 2016-03-29 NOTE — Telephone Encounter (Signed)
LVM on home # and mailed letter for appt on 05/06/16 for f/u

## 2016-03-29 NOTE — Progress Notes (Signed)
Patient Name: Johnny Sims Date of Encounter: 03/29/2016  Principal Problem:   Acute on chronic diastolic CHF (congestive heart failure), NYHA class 3 (HCC) Active Problems:   Hyperlipidemia   Essential hypertension   COPD exacerbation (HCC)   GERD (gastroesophageal reflux disease)   Atrial fibrillation status post cardioversion (HCC)   Type 2 diabetes mellitus with diabetic polyneuropathy, with long-term current use of insulin (HCC)   CKD (chronic kidney disease), stage V ([place])   Hypokalemia   Primary Cardiologist: Dr Acie Fredrickson Patient Profile: 60 yo male w/ hx PAF on coumadin, DM, HTN, HLD, COPD, RA, Hep C, bipolar, CKD V, GERD. Admitted 09/30 w/ CHF  SUBJECTIVE: Wants big breakfast, denies SOB, not aware of afib. Restless. Periodic desats overnight, ?OSA  OBJECTIVE Vitals:   03/28/16 2107 03/28/16 2200 03/29/16 0200 03/29/16 0435  BP:  140/80 129/83 (!) 149/99  Pulse:      Resp:  12 20 16   Temp: 97.8 F (36.6 C)  97.5 F (36.4 C) 97.9 F (36.6 C)  TempSrc: Oral  Oral Oral  SpO2: 99% 98% 94% 98%  Weight:      Height:        Intake/Output Summary (Last 24 hours) at 03/29/16 0658 Last data filed at 03/29/16 0615  Gross per 24 hour  Intake             2012 ml  Output             3200 ml  Net            -1188 ml   Filed Weights   03/26/16 1158 03/27/16 0500 03/28/16 0500  Weight: 287 lb 12.8 oz (130.5 kg) 286 lb 9.6 oz (130 kg) 286 lb 9.6 oz (130 kg)    PHYSICAL EXAM General: Well developed, well nourished, male in no acute distress. Head: Normocephalic, atraumatic.  Neck: Supple without bruits, JVD not elevated. Lungs:  Resp regular and unlabored, rales bases, good air exchange. Heart: Irreg R&R, S1, S2, no S3, S4, or murmur; no rub. Abdomen: Soft, non-tender, non-distended, BS + x 4.  Extremities: No clubbing, cyanosis, edema. Lower legs are wrapped, not disturbed Neuro: Alert and oriented X 3. Moves all extremities spontaneously. Psych: Normal  affect.  LABS: CBC: Recent Labs  03/26/16 1215  03/28/16 0155 03/28/16 1202  WBC 13.9*  < > 11.0* 14.5*  NEUTROABS 12.1*  --   --   --   HGB 8.9*  < > 8.5* 8.6*  HCT 27.2*  < > 26.3* 26.4*  MCV 87.5  < > 88.0 88.0  PLT 163  < > 146* 143*  < > = values in this interval not displayed. INR: Recent Labs  03/29/16 0505  INR [id number]   Basic Metabolic Panel: Recent Labs  03/26/16 1626  03/28/16 0155 03/29/16 0505  NA  --   < > 136 131*  K  --   < > 3.2* 3.5  CL  --   < > 98* 95*  CO2  --   < > 21* 20*  GLUCOSE  --   < > 306* 338*  BUN  --   < > 166* 180*  CREATININE  --   < > 6.72* 6.64*  CALCIUM  --   < > 7.6* 8.0*  MG 1.7  --   --   --   PHOS  --   --  6.4*  --   < > = values in this interval not displayed.  Liver Function Tests: Recent Labs  03/26/16 1215 03/28/16 0155  AST 29  --   ALT 21  --   ALKPHOS 42  --   BILITOT 1.1  --   PROT 6.8  --   ALBUMIN 3.4* 3.1*    Recent Labs  03/26/16 1225  TROPIPOC 0.05   BNP:  B Natriuretic Peptide  Date/Time Value Ref Range Status  03/26/2016 12:15 PM 894.3 (H) 0.0 - 100.0 pg/mL Final  10/07/2015 02:52 PM 604.9 (H) 0.0 - 100.0 pg/mL Final   Anemia Panel: Recent Labs  03/27/16 1230  TIBC 332  IRON 32*    TELE:    Atrial fib, RVR at times    ECHO: 10/02  - Left ventricle: The cavity size was mildly to moderately dilated.   There was moderate concentric hypertrophy. Systolic function was   mildly reduced. The estimated ejection fraction was in the range   of 45% to 50%. Wall motion was normal; there were no regional   wall motion abnormalities. (EF 55-60% 10/2014) - Aortic valve: Transvalvular velocity was within the normal range.   There was no stenosis. There was no regurgitation. - Mitral valve: Calcified annulus. Transvalvular velocity was   within the normal range. There was no evidence for stenosis.   There was moderate regurgitation. - Left atrium: The atrium was severely dilated. - Right  ventricle: The cavity size was normal. Wall thickness was   normal. Systolic function was normal. - Tricuspid valve: There was mild regurgitation. - Pulmonary arteries: Systolic pressure was severely increased. PA   peak pressure: 60 mm Hg (S). - Pericardium, extracardiac: There was a left pleural effusion.   Current Medications:  . calcium acetate  1,334 mg Oral TID WC  . cholecalciferol  1,000 Units Oral Daily  . darbepoetin (ARANESP) injection - DIALYSIS  100 mcg Intravenous Q Tue-HD  . diazepam  10 mg Oral Q6H  . fentaNYL  75 mcg Transdermal Q72H  . ferric gluconate (FERRLECIT/NULECIT) IV  125 mg Intravenous Daily  . furosemide  120 mg Intravenous Q12H  . hydrALAZINE  25 mg Oral TID  . insulin glargine  35 Units Subcutaneous Q2200  . ipratropium-albuterol  3 mL Nebulization TID  . levofloxacin  500 mg Oral Q48H  . levothyroxine  88 mcg Oral QAC breakfast  . metoprolol  100 mg Oral BID  . multivitamin  1 tablet Oral QHS  . nicotine  14 mg Transdermal Daily  . nitroGLYCERIN  0.5 inch Topical Q6H  . potassium chloride  40 mEq Oral BID  . predniSONE  40 mg Oral Q breakfast  . simvastatin  20 mg Oral q1800  . sodium chloride flush  3 mL Intravenous Q12H     ASSESSMENT AND PLAN: 1. Acute on chronic CHF- diastolic parameters not listed (in 2016 or 2017), but EF is down slightly. He is diuresing fairly well considering his advanced renal disease. I/O negative 4.4 liters. Weight only down one lb. Ultimately dialysis will be needed to maintain volume status. Per Nephrology 2. Afib chronic. Rate is well controlled. Coumadin sub-therapeutic, on hold for vascular access procedures. 3. Chronic anemia. 4. CKD stage V. Per nephrology. 5. LVD - new from 2016, no RWMA, ?from Afib or HTN vs CAD, MD advise on further evaluation and its timing.  Present on Admission: . Acute on chronic diastolic CHF (congestive heart failure), NYHA class 3 ([place]) . Atrial fibrillation status post  cardioversion ([place]) . COPD exacerbation ([place]) . Hyperlipidemia . CKD (chronic kidney  disease), stage V ([place]) . Hypokalemia . Essential hypertension . GERD (gastroesophageal reflux disease)   Signed, Lenoard Aden 6:58 AM 03/29/2016 Patient seen and examined and history reviewed. Agree with above findings and plan. Seen on HD. Had AV graft placed earlier today. Breathing is OK. afib rate is well controlled. Dialysis will help with volume management. OK to resume coumadin.  Marygrace Sandoval [place], [place] 03/29/2016 3:56 PM

## 2016-03-29 NOTE — Procedures (Signed)
Patient was seen on dialysis and the procedure was supervised.  BFR 200  Via PC BP is  138/78.   Patient appears to be tolerating treatment well- somnolent from surgery   Johnny Sims A 03/29/2016

## 2016-03-29 NOTE — Op Note (Signed)
OPERATIVE NOTE   PROCEDURE: 1. US guided placement of right ij tunneled dialysis catheter 2. Left first stage brachio-basilic av fistula creation  PRE-OPERATIVE DIAGNOSIS: esrd  POST-OPERATIVE DIAGNOSIS: same  SURGEON: Aniya Jolicoeur C. Donzetta Matters, MD  ASSISTANT(S): Gerri Lins, PA  ANESTHESIA: general  ESTIMATED BLOOD LOSS: 20 cc  FINDING(S): Large internal jugular vein, placement of tdc at svc/innominate junction Suitable basilic vein and brachial artery for avf creation, thrill in runoff vein post avf   SPECIMEN(S):  none  INDICATIONS:   Johnny Sims is a 60 y.o. male who is here in the hospital and needs dialysis access. He was therefore scheduled for tunneled dialysis catheter as well as AV fistula creation. Vein mapping demonstrated adequate basilic vein on the left arm and he was consented for both tunneled dialysis catheter and AV fistula creation which he agrees to proceed.  DESCRIPTION: After full informed written consent was obtained from the patient, the patient was brought back to the operating room and placed supine upon the operating table.  Prior to induction, the patient received IV antibiotics.  He was then sterilely prepped and draped in his right neck and chest area. Ultrasound guidance was used to identify the internal jugular vein and this was cannulated with 18-gauge needle on the right. Wire was passed into the inferior vena cava under fluoroscopic guidance. The tract was then serially dilated and the introduction sheath and placed over the wire and the wire and introducer removed. A tunnel was then used to tunnel from a counter incision just below the clavicle laterally to the incision at the neck. A 19 cm catheter was then tunneled trimmed to size and placed through the introducer sheath. The flush components were then put together on the tunneled catheter and it was flushed with heparinized saline. Fluoroscopic completion picture demonstrated no kinks or bends  with a termination at the SVC and innominate junction. The tunnel catheter was then flushed with adequate 1000 units per 1 mL of heparin. The neck incision was closed with 4-0 Monocryl suture and the counter incision with 3-0 nylon which was used to affix the tunnel catheter. Transfer taken down. He was then reprepped and draped in his left upper extremity in the usual fashion for a AV fistula. Another timeout was called we then made a transverse incision below the antecubitum identified our basilic vein traced this out proximally and distally for approximately 1 cm. Distally it was ligated with a 3-0 silk suture serially dilated up to 4 mm and flushed with heparinized saline. The brachial artery was then dissected free encircled with vessel loops clamped proximally and distally and opened longitudinally with 11 blade followed by Potts scissors. Proximal and distal were then flushed with heparinized saline. The basilic vein was then sewn end-to-side with 6-0 Prolene suture in a running fashion. Prior to completing the anastomosis the vein proximal and distal artery were allowed to flush to prevent embolism. Having completed our anastomosis did require one repair suture. Doppler demonstrated continuous runoff in the basilic vein and there was a palpable thrill. There was also a palpable radial pulse at this time. Satisfied the wound was irrigated closed in 2 layers with 3-0 Vicryl for Monocryl suture. Patient was allowed awaken from anesthesia having tolerated the procedure well and transferred to PACU with plans for return to his room.   COMPLICATIONS: none  CONDITION: stable   Vanessia Bokhari C. Donzetta Matters, MD [facility] Office: [phone] Pager: [phone]  03/29/2016, 11:29 AM

## 2016-03-29 NOTE — Progress Notes (Signed)
Subjective:  Is still making urine with diuretics but getting more uremic- VVS is planning on PC today- on way to OR- INR is down - sugar is up   Objective Vital signs in last 24 hours: Vitals:   03/29/16 0435 03/29/16 0751 03/29/16 0758 03/29/16 0817  BP: (!) 149/99 (!) 149/105  (!) 154/103  Pulse:    91  Resp: 16   11  Temp: 97.9 F (36.6 C) 98.2 F (36.8 C)    TempSrc: Oral Oral    SpO2: 98%  98% 99%  Weight:      Height:       Weight change:   Intake/Output Summary (Last 24 hours) at 03/29/16 0919 Last data filed at 03/29/16 0615  Gross per 24 hour  Intake             1642 ml  Output             2800 ml  Net            -1158 ml    Assessment/ Plan: Pt is a 60 y.o. yo male who was admitted on 03/26/2016 with progressive renal failure- now requiring initiation of dialysis   Assessment/Plan: 1. Renal- basically progressive renal failure likely secondary to DM vs hep C- now uremic and requiring initiation of HD- he seems to understand but will continue to need education about what this means for him.   PC and access placed followed by first HD treatment today, second tomorrow- CLIP for OP dialysis arrangements.  Will do 4 K bath- also on K repletion - will stop K repletion since no access right now and K is above 3  2. HTN/vol- overloaded but diuresing so far-stop diuretics as will start HD today  - also still on hydralazine and lopressor- will likely be able to wean once gets established with HD 3. Anemia- hgb in the 8's  iron stores low- replete and add ESA  4. Secondary hyperparathyroidism- phos 6.4- no recent PTH- will add binder and check PTH    Mckensie Scotti A    Labs: Basic Metabolic Panel:  Recent Labs Lab 03/27/16 0319 03/28/16 0155 03/29/16 0505  NA 137 136 131*  K 3.1* 3.2* 3.5  CL 101 98* 95*  CO2 19* 21* 20*  GLUCOSE 213* 306* 338*  BUN 162* 166* 180*  CREATININE 6.56* 6.72* 6.64*  CALCIUM 7.2* 7.6* 8.0*  PHOS  --  6.4*  --    Liver Function  Tests:  Recent Labs Lab 03/26/16 1215 03/28/16 0155  AST 29  --   ALT 21  --   ALKPHOS 42  --   BILITOT 1.1  --   PROT 6.8  --   ALBUMIN 3.4* 3.1*   No results for input(s): LIPASE, AMYLASE in the last 168 hours. No results for input(s): AMMONIA in the last 168 hours. CBC:  Recent Labs Lab 03/26/16 1215 03/27/16 0319 03/28/16 0155 03/28/16 1202 03/29/16 0646  WBC 13.9* 12.9* 11.0* 14.5* 16.1*  NEUTROABS 12.1*  --   --   --   --   HGB 8.9* 8.8* 8.5* 8.6* 8.6*  HCT 27.2* 27.4* 26.3* 26.4* 26.3*  MCV 87.5 89.0 88.0 88.0 88.0  PLT 163 152 146* 143* 150   Cardiac Enzymes: No results for input(s): CKTOTAL, CKMB, CKMBINDEX, TROPONINI in the last 168 hours. CBG:  Recent Labs Lab 03/28/16 0856 03/28/16 1215 03/28/16 1649 03/28/16 2105 03/29/16 0755  GLUCAP 256* 354* 343* 412* 311*    Iron Studies:  Recent Labs  03/27/16 1230  IRON 32*  TIBC 332   Studies/Results: No results found. Medications: Infusions:    Scheduled Medications: . calcium acetate  1,334 mg Oral TID WC  . cholecalciferol  1,000 Units Oral Daily  . darbepoetin (ARANESP) injection - DIALYSIS  100 mcg Intravenous Q Tue-HD  . diazepam  10 mg Oral Q6H  . fentaNYL  75 mcg Transdermal Q72H  . ferric gluconate (FERRLECIT/NULECIT) IV  125 mg Intravenous Daily  . furosemide  120 mg Intravenous Q12H  . hydrALAZINE  25 mg Oral TID  . insulin glargine  35 Units Subcutaneous Q2200  . ipratropium-albuterol  3 mL Nebulization TID  . levofloxacin  500 mg Oral Q48H  . levothyroxine  88 mcg Oral QAC breakfast  . metoprolol  100 mg Oral BID  . multivitamin  1 tablet Oral QHS  . nicotine  14 mg Transdermal Daily  . nitroGLYCERIN  0.5 inch Topical Q6H  . potassium chloride  40 mEq Oral BID  . predniSONE  40 mg Oral Q breakfast  . simvastatin  20 mg Oral q1800  . sodium chloride flush  3 mL Intravenous Q12H    have reviewed scheduled and prn medications.  Physical Exam: General: more somnolent  today (sugar and high BUN)  Heart: tachy Lungs: dec BS at bases Abdomen: obese, soft Extremities: positive edema with bruising Dialysis Access: none yet     03/29/2016,9:19 AM  LOS: 3 days

## 2016-03-29 NOTE — Anesthesia Preprocedure Evaluation (Addendum)
Anesthesia Evaluation  Patient identified by MRN, date of birth, ID band Patient awake    Reviewed: Allergy & Precautions, NPO status , Patient's Chart, lab work & pertinent test results  Airway Mallampati: II  TM Distance: >3 FB Neck ROM: Full    Dental   Pulmonary asthma , COPD, Current Smoker,    breath sounds clear to auscultation       Cardiovascular hypertension, +CHF   Rhythm:Regular Rate:Normal  Left ventricle: The cavity size was mildly to moderately dilated.   There was moderate concentric hypertrophy. Systolic function was   mildly reduced. The estimated ejection fraction was in the range   of 45% to 50%. Wall motion was normal; there were no regional   wall motion abnormalities. - Aortic valve: Transvalvular velocity was within the normal range.   There was no stenosis. There was no regurgitation. - Mitral valve: Calcified annulus. Transvalvular velocity was   within the normal range. There was no evidence for stenosis.   There was moderate regurgitation. - Left atrium: The atrium was severely dilated. - Right ventricle: The cavity size was normal. Wall thickness was   normal. Systolic function was normal. - Tricuspid valve: There was mild regurgitation. - Pulmonary arteries: Systolic pressure was severely increased. PA   peak pressure: 60 mm Hg (S). - Pericardium, extracardiac: There was a left pleural effusion.   Neuro/Psych Anxiety Bipolar Disorder  Neuromuscular disease    GI/Hepatic GERD  ,(+) Hepatitis -, C  Endo/Other  diabetesHypothyroidism   Renal/GU ESRF and DialysisRenal disease     Musculoskeletal  (+) Arthritis , Fibromyalgia -  Abdominal   Peds  Hematology  (+) anemia ,   Anesthesia Other Findings   Reproductive/Obstetrics                            Lab Results  Component Value Date   WBC 16.1 (H) 03/29/2016   HGB 8.6 (L) 03/29/2016   HCT 26.3 (L) 03/29/2016    MCV 88.0 03/29/2016   PLT 150 03/29/2016   Lab Results  Component Value Date   CREATININE 6.64 (H) 03/29/2016   BUN 180 (H) 03/29/2016   NA 131 (L) 03/29/2016   K 3.5 03/29/2016   CL 95 (L) 03/29/2016   CO2 20 (L) 03/29/2016    Anesthesia Physical Anesthesia Plan  ASA: III  Anesthesia Plan: General   Post-op Pain Management:    Induction: Intravenous  Airway Management Planned: LMA and Oral ETT  Additional Equipment:   Intra-op Plan:   Post-operative Plan: Extubation in OR  Informed Consent: I have reviewed the patients History and Physical, chart, labs and discussed the procedure including the risks, benefits and alternatives for the proposed anesthesia with the patient or authorized representative who has indicated his/her understanding and acceptance.   Dental advisory given  Plan Discussed with: CRNA  Anesthesia Plan Comments:        Anesthesia Quick Evaluation

## 2016-03-29 NOTE — Progress Notes (Addendum)
Patient CBG over 400 last night, 311 this AM which caused delay of going to OR. New orders received for one time dose of 4mg  zofran and 10 units novolog. Patient bathed with CHG wipes. Short Stay RN updated of patient meds & status, ready for OR when called.   0856 - call from [facility], they are sending transport at this time.

## 2016-03-29 NOTE — Progress Notes (Signed)
Report from PACU RN is that patient is stable, HD RN Almyra Brace will receive patient from PACU.

## 2016-03-29 NOTE — Progress Notes (Signed)
Received call from HD RN Dain, discussed patient coming straight to HD from PACU for HD treatment. Advised would speak with PACU and if patient stable we would try and do this.

## 2016-03-30 ENCOUNTER — Encounter (HOSPITAL_COMMUNITY): Payer: Self-pay | Admitting: Vascular Surgery

## 2016-03-30 DIAGNOSIS — Z992 Dependence on renal dialysis: Secondary | ICD-10-CM

## 2016-03-30 DIAGNOSIS — N186 End stage renal disease: Secondary | ICD-10-CM

## 2016-03-30 DIAGNOSIS — I10 Essential (primary) hypertension: Secondary | ICD-10-CM

## 2016-03-30 LAB — BASIC METABOLIC PANEL
Anion gap: 13 (ref 5–15)
BUN: 132 mg/dL — AB (ref 6–20)
CALCIUM: 8.4 mg/dL — AB (ref 8.9–10.3)
CO2: 23 mmol/L (ref 22–32)
CREATININE: 5.25 mg/dL — AB (ref 0.61–1.24)
Chloride: 98 mmol/L — ABNORMAL LOW (ref 101–111)
GFR calc Af Amer: 12 mL/min — ABNORMAL LOW (ref >60)
GFR, EST NON AFRICAN AMERICAN: 11 mL/min — AB (ref >60)
GLUCOSE: 254 mg/dL — AB (ref 65–99)
Potassium: 3.9 mmol/L (ref 3.5–5.1)
Sodium: 134 mmol/L — ABNORMAL LOW (ref 135–145)

## 2016-03-30 LAB — CBC
HEMATOCRIT: 26 % — AB (ref 39.0–52.0)
Hemoglobin: 8.5 g/dL — ABNORMAL LOW (ref 13.0–17.0)
MCH: 28.9 pg (ref 26.0–34.0)
MCHC: 32.7 g/dL (ref 30.0–36.0)
MCV: 88.4 fL (ref 78.0–100.0)
PLATELETS: 135 10*3/uL — AB (ref 150–400)
RBC: 2.94 MIL/uL — ABNORMAL LOW (ref 4.22–5.81)
RDW: 19.6 % — AB (ref 11.5–15.5)
WBC: 16.1 10*3/uL — AB (ref 4.0–10.5)

## 2016-03-30 LAB — GLUCOSE, CAPILLARY
GLUCOSE-CAPILLARY: 174 mg/dL — AB (ref 65–99)
Glucose-Capillary: 273 mg/dL — ABNORMAL HIGH (ref 65–99)
Glucose-Capillary: 241 mg/dL — ABNORMAL HIGH (ref 65–99)

## 2016-03-30 LAB — PROTIME-INR
INR: 1.25
Prothrombin Time: 15.8 seconds — ABNORMAL HIGH (ref 11.4–15.2)

## 2016-03-30 LAB — PARATHYROID HORMONE, INTACT (NO CA): PTH: 155 pg/mL — AB (ref 15–65)

## 2016-03-30 MED ORDER — WARFARIN SODIUM 5 MG PO TABS
10.0000 mg | ORAL_TABLET | Freq: Once | ORAL | Status: AC
Start: 2016-03-30 — End: 2016-03-30
  Administered 2016-03-30: 10 mg via ORAL
  Filled 2016-03-30: qty 2

## 2016-03-30 MED ORDER — HYDROMORPHONE HCL 2 MG PO TABS: 1.0000 mg | ORAL_TABLET | Freq: Four times a day (QID) | ORAL | Status: DC | PRN

## 2016-03-30 MED ORDER — HYDRALAZINE HCL 50 MG PO TABS
50.0000 mg | ORAL_TABLET | Freq: Three times a day (TID) | ORAL | Status: DC
  Administered 2016-03-30 – 2016-04-02 (×9): 50 mg via ORAL
  Filled 2016-03-30 (×9): qty 1

## 2016-03-30 MED ORDER — HYDROMORPHONE HCL 1 MG/ML IJ SOLN
1.0000 mg | Freq: Four times a day (QID) | INTRAMUSCULAR | Status: DC | PRN
  Administered 2016-03-30 – 2016-03-31 (×3): 1 mg via INTRAVENOUS
  Filled 2016-03-30 (×3): qty 1

## 2016-03-30 MED ORDER — WARFARIN - PHARMACIST DOSING INPATIENT
Freq: Every day | Status: DC
Start: 2016-03-30 — End: 2016-04-02
  Administered 2016-03-31: 20:00:00

## 2016-03-30 MED ORDER — HYDROMORPHONE HCL 1 MG/ML IJ SOLN
2.0000 mg | INTRAMUSCULAR | Status: DC | PRN
  Administered 2016-03-30: 2 mg via INTRAVENOUS
  Filled 2016-03-30: qty 2

## 2016-03-30 MED ORDER — PREDNISONE 20 MG PO TABS
40.0000 mg | ORAL_TABLET | Freq: Every day | ORAL | Status: AC
  Administered 2016-03-31: 40 mg via ORAL
  Filled 2016-03-30: qty 2

## 2016-03-30 NOTE — Progress Notes (Signed)
ANTICOAGULATION CONSULT NOTE - Initial Consult  Pharmacy Consult for coumadin Indication: atrial fibrillation  Allergies  Allergen Reactions  . Novolog [Insulin Aspart] Other (See Comments)    UNSPECIFIED REACTION  "I'm just highly allergic"  . Hydrocodone-Acetaminophen Palpitations and Other (See Comments)    Doesn't want to take  . Iodides Other (See Comments)    Patient states he doesn't know what iodine is and doesn't think he is allergic to it despite it being listed with his allergies  . Other Hives and Other (See Comments)    Steroid creams "Corndogs"- Nausea & vomiting   . Sulfacetamide Sodium Hives and Itching  . Tylenol [Acetaminophen] Nausea And Vomiting  . Gabapentin Swelling    SWELLING REACTION UNSPECIFIED   . Oxycodone-Acetaminophen Nausea And Vomiting  . Antihistamines, Chlorpheniramine-Type Other (See Comments)    unknown  . Hydralazine Rash  . Iodinated Diagnostic Agents Other (See Comments)    Patient states he doesn't know what iodine is and doesn't think he is allergic to it despite it being listed with his allergies  . Latex Rash  . Lidocaine Itching and Other (See Comments)    Patient is uncertain of this allergy  . Pentazocine Other (See Comments)    unknown  . Pheniramine Other (See Comments)    unknown  . Sulfa Antibiotics Itching and Other (See Comments)  . Tape Rash  . Vicodin [Hydrocodone-Acetaminophen] Palpitations and Other (See Comments)    Doesn't want to take    Patient Measurements: Height: 6\' 10"  (208.3 cm) Weight: 295 lb 6.7 oz (134 kg) IBW/kg (Calculated) : 100.6   Vital Signs: Temp: 97.1 F (36.2 C) (10/04 1152) Temp Source: Oral (10/04 1152) BP: 151/84 (10/04 1152) Pulse Rate: 122 (10/04 0908)  Labs:  Recent Labs  03/28/16 0155 03/28/16 1202 03/29/16 0505 03/29/16 0646 03/30/16 0500  HGB 8.5* 8.6*  --  8.6* 8.5*  HCT 26.3* 26.4*  --  26.3* 26.0*  PLT 146* 143*  --  150 135*  LABPROT 28.8*  --  17.5*  --  15.8*   INR 2.65  --  1.42  --  1.25  CREATININE 6.72*  --  6.64*  --  5.25*    Estimated Creatinine Clearance: 24.1 mL/min (by C-G formula based on SCr of 5.25 mg/dL (H)).   Medical History: Past Medical History:  Diagnosis Date  . Anxiety   . Asthma   . Bipolar 1 disorder ([place])   . Chronic pain   . CKD (chronic kidney disease), stage II   . COPD (chronic obstructive pulmonary disease) ([place])   . Diabetes mellitus   . Fibromyalgia   . GERD (gastroesophageal reflux disease)   . Hepatitis C   . Hepatitis C   . Hyperlipidemia   . Hypertension   . Hypothyroidism   . Melanoma ([place])    face, shoulder arm  . OA (osteoarthritis)   . PAF (paroxysmal atrial fibrillation) ([place])   . Pancreatitis, acute 2015  . RA (rheumatoid arthritis) ([place])   . Venous stasis     Medications:  Prescriptions Prior to Admission  Medication Sig Dispense Refill Last Dose  . albuterol (PROVENTIL HFA;VENTOLIN HFA) 108 (90 BASE) MCG/ACT inhaler Inhale 2 puffs into the lungs every 4 (four) hours as needed for wheezing or shortness of breath.   03/26/2016 at am  . albuterol (PROVENTIL) (2.5 MG/3ML) 0.083% nebulizer solution Take 2.5 mg by nebulization 4 (four) times daily.   03/26/2016 at am  . cephALEXin (KEFLEX) 500 MG capsule Take  1 capsule (500 mg total) by mouth 4 (four) times daily. 40 capsule 0 03/24/2016 at pm  . cholecalciferol (VITAMIN D) 1000 units tablet Take 1,000 Units by mouth daily.   03/26/2016 at am  . COUMADIN 10 MG tablet Take as directed by the Coumadin clinic. (Patient taking differently: Take 10 mg by mouth See admin instructions. Mon/Tues/Wed/Fri/Sat (in the evening)) 25 tablet 3 03/25/2016 at pm  . COUMADIN 7.5 MG tablet Take as directed by Coumadin clinic. (Patient taking differently: Take 7.5 mg by mouth. Sun/Thurs (in the evening)) 10 tablet 3 03/24/2016 at pm  . diazepam (VALIUM) 10 MG tablet Take 10 mg by mouth every 4 (four) hours as needed for anxiety.    03/26/2016 at am  . fentaNYL  (DURAGESIC - DOSED MCG/HR) 75 MCG/HR Place 75 mcg onto the skin every 3 (three) days.   03/26/2016 at am  . furosemide (LASIX) 80 MG tablet Take 1 tablet (80 mg total) by mouth 2 (two) times daily. (Patient taking differently: Take 80-160 mg by mouth See admin instructions. 80 mg once a day and and an additional 80 mg in the evening every other day) 8 tablet 0 03/26/2016 at am  . Insulin Glargine (LANTUS SOLOSTAR) 100 UNIT/ML Solostar Pen Inject 30 Units into the skin daily at 10 pm. 15 mL 11 03/25/2016 at pm  . levothyroxine (SYNTHROID) 88 MCG tablet Take 1 tablet (88 mcg total) by mouth daily before breakfast. Will send additional refills based off blood level (Patient taking differently: Take 88 mcg by mouth daily before breakfast. ) 30 tablet 0 03/26/2016 at am  . metolazone (ZAROXOLYN) 2.5 MG tablet Take 2.5 mg by mouth daily.   03/26/2016 at am  . metoprolol (LOPRESSOR) 50 MG tablet Take 1 tablet (50 mg total) by mouth 3 (three) times daily. 270 tablet 3 03/26/2016 at 0800  . Multiple Vitamin (MULTIVITAMIN) tablet Take 1 tablet by mouth every morning.    03/26/2016 at 0800  . nicotine (NICODERM CQ - DOSED IN MG/24 HOURS) 14 mg/24hr patch Place 14 mg onto the skin daily.   03/26/2016 at am  . omeprazole (PRILOSEC) 20 MG capsule Take 1 capsule (20 mg total) by mouth daily. Take with Harvoni at the same time under FASTING condition (Patient taking differently: Take 20 mg by mouth daily. ) 90 capsule 11 03/26/2016 at am  . simvastatin (ZOCOR) 20 MG tablet Take 1 tablet (20 mg total) by mouth daily at 6 PM. (Patient taking differently: Take 10 mg by mouth daily at 6 PM. ) 90 tablet 5 03/25/2016 at pm  . SPIRIVA HANDIHALER 18 MCG inhalation capsule INHALE CONTENTS OF ONE CAPSULE ONCE A DAY 30 capsule 11 03/25/2016 at am  . SURE COMFORT PEN NEEDLES 31G X 8 MM MISC USE ONE NIGHTLY 100 each 12 Taking  . nicotine (NICODERM CQ - DOSED IN MG/24 HOURS) 21 mg/24hr patch Place 1 patch (21 mg total) onto the skin daily.  (Patient not taking: Reported on 03/26/2016) 28 patch 0 Not Taking at Unknown time  . psyllium (HYDROCIL/METAMUCIL) 95 % PACK Take 1 packet by mouth daily. (Patient not taking: Reported on 03/26/2016) 56 each 3 Not Taking at Unknown time    Assessment: 60 yo M on coumadin PTA for PAF.  Coumadin was reversed for portacath and AVF placement 10/4 for first HD treatment 10/4.  Pharmacy consulted to resume coumadin for afib.  Anemia with low iron stores - renal is giving IV iron and ESA.  INR on admit 6.9>8.2>Vit  K 7.5 mg po>2.65 >Vit K  5 mg>1.42>1.25 7.5mg  total po vitamin K given on 10/1 and vitamin k 5 mg given 10/2; DDI with Levaquin. Coumadin reversed to place HD access, resumed 10/4  PTA dose: 7.5 mg on Sunday and Thursday and 10 mg AOD's    Goal of Therapy:  INR 2-3 Monitor platelets by anticoagulation protocol: Yes   Plan: coumadin 10 mg po x 1 dose today Daily INR  Eudelia Bunch, Pharm.D. BP:[id number] 03/30/2016 12:11 PM

## 2016-03-30 NOTE — Progress Notes (Signed)
  Progress Note    03/30/2016 9:11 AM 1 Day Post-Op  Subjective:  Angry this morning about pain medicine, threatening to leave, pain is all over  Vitals:   03/30/16 0700 03/30/16 0908  BP: (!) 188/131   Pulse:  (!) 122  Resp: (!) 26   Temp: 98.2 F (36.8 C)     Physical Exam: Awake and alert R IJ tdc in place without hematoma Left arm thrill, palpable left radial pulse  CBC    Component Value Date/Time   WBC 16.1 (H) 03/30/2016 0500   RBC 2.94 (L) 03/30/2016 0500   HGB 8.5 (L) 03/30/2016 0500   HCT 26.0 (L) 03/30/2016 0500   PLT 135 (L) 03/30/2016 0500   MCV 88.4 03/30/2016 0500   MCH 28.9 03/30/2016 0500   MCHC 32.7 03/30/2016 0500   RDW 19.6 (H) 03/30/2016 0500   LYMPHSABS 1.2 03/26/2016 1215   MONOABS 0.7 03/26/2016 1215   EOSABS 0.0 03/26/2016 1215   BASOSABS 0.0 03/26/2016 1215    BMET    Component Value Date/Time   NA 134 (L) 03/30/2016 0500   K 3.9 03/30/2016 0500   CL 98 (L) 03/30/2016 0500   CO2 23 03/30/2016 0500   GLUCOSE 254 (H) 03/30/2016 0500   BUN 132 (H) 03/30/2016 0500   CREATININE 5.25 (H) 03/30/2016 0500   CREATININE 2.64 (H) 04/28/2015 1053   CALCIUM 8.4 (L) 03/30/2016 0500   GFRNONAA 11 (L) 03/30/2016 0500   GFRAA 12 (L) 03/30/2016 0500    INR    Component Value Date/Time   INR 1.25 03/30/2016 0500     Intake/Output Summary (Last 24 hours) at 03/30/16 0911 Last data filed at 03/30/16 0659  Gross per 24 hour  Intake          1949.83 ml  Output             1750 ml  Net           199.83 ml     Assessment:  60 y.o. male is s/p rij tdc and left 1st stage bvt  Plan: [place] for full anticoagulation tdc ok for use Will need second stage bvt in the future f/u in 4-6 weeks in office with lue duplex   Danita Proud C. Donzetta Matters, MD [facility] Office: [phone] Pager: [phone]  03/30/2016 9:11 AM

## 2016-03-30 NOTE — Progress Notes (Addendum)
Pt c/o generalized pain Pt is getting scheduled dilaudid and Valium PO as ordered. Very loud voice says if I can't get the pain meds in the arm than" I'm gonna leave and go to [facility]" MD's that have seen pt this am aware of behavior.Pt using foul language - Pushing table forcefully to the sink area and strapped his watch on his wrist. Pt made aware of when next dose of each med is due

## 2016-03-30 NOTE — Plan of Care (Signed)
Problem: Pain Managment: Goal: General experience of comfort will improve Outcome: Not Progressing Pt pain is not well controlled with dilaudid 1 mg q8hr, paged night coverage and order was changed to q4hr. Pt is still complaining of pain. Needs better pain management.

## 2016-03-30 NOTE — Progress Notes (Signed)
Subjective:  S/P PC and AVF yesterday and first HD treatment- "im leaving because they wont give me enough pain medicine" Objective Vital signs in last 24 hours: Vitals:   03/30/16 0400 03/30/16 0434 03/30/16 0500 03/30/16 0700  BP: (!) 159/87   (!) 188/131  Pulse:      Resp: 18 20  (!) 26  Temp:    98.2 F (36.8 C)  TempSrc:    Oral  SpO2: 99% 100%    Weight:   134 kg (295 lb 6.7 oz)   Height:       Weight change:   Intake/Output Summary (Last 24 hours) at 03/30/16 0842 Last data filed at 03/30/16 0659  Gross per 24 hour  Intake          1949.83 ml  Output             1750 ml  Net           199.83 ml    Assessment/ Plan: Pt is a 60 y.o. yo male who was admitted on 03/26/2016 with progressive renal failure- now requiring initiation of dialysis   Assessment/Plan: 1. Renal- basically progressive renal failure likely secondary to DM vs hep C- now uremic and requiring initiation of HD- he seems to understand but will continue to need education about what this means for him.   PC and access placed 10/3 followed by first HD treatment, second treatment today- CLIP for OP dialysis arrangements.  Will do 4 K bath-  3rd treatment tomorrow 2. HTN/vol- overloaded but improving - control with HD   - also still on hydralazine and lopressor- will likely be able to wean once gets established with HD 3. Anemia- hgb in the 8's  iron stores low- replete and add ESA  4. Secondary hyperparathyroidism- phos 6.4- no recent PTH- will add binder  PTH is 155- no vit D 5. Pain control/psych- will give IV pain med the next 24 hours.  Consider psych involvement to get him on a medication regimen that will allow him to negotiate things more successfully than he has in the past- otherwise looking at repeated hospitalizations and failure in his future    Draya Felker A    Labs: Basic Metabolic Panel:  Recent Labs Lab 03/28/16 0155 03/29/16 0505 03/30/16 0500  NA 136 131* 134*  K 3.2* 3.5 3.9   CL 98* 95* 98*  CO2 21* 20* 23  GLUCOSE 306* 338* 254*  BUN 166* 180* 132*  CREATININE 6.72* 6.64* 5.25*  CALCIUM 7.6* 8.0* 8.4*  PHOS 6.4*  --   --    Liver Function Tests:  Recent Labs Lab 03/26/16 1215 03/28/16 0155  AST 29  --   ALT 21  --   ALKPHOS 42  --   BILITOT 1.1  --   PROT 6.8  --   ALBUMIN 3.4* 3.1*   No results for input(s): LIPASE, AMYLASE in the last 168 hours. No results for input(s): AMMONIA in the last 168 hours. CBC:  Recent Labs Lab 03/26/16 1215 03/27/16 0319 03/28/16 0155 03/28/16 1202 03/29/16 0646 03/30/16 0500  WBC 13.9* 12.9* 11.0* 14.5* 16.1* 16.1*  NEUTROABS 12.1*  --   --   --   --   --   HGB 8.9* 8.8* 8.5* 8.6* 8.6* 8.5*  HCT 27.2* 27.4* 26.3* 26.4* 26.3* 26.0*  MCV 87.5 89.0 88.0 88.0 88.0 88.4  PLT 163 152 146* 143* 150 135*   Cardiac Enzymes: No results for input(s): CKTOTAL, CKMB, CKMBINDEX, TROPONINI  in the last 168 hours. CBG:  Recent Labs Lab 03/29/16 0933 03/29/16 1144 03/29/16 1718 03/29/16 1949 03/30/16 0744  GLUCAP 225* 146* 189* 254* 174*    Iron Studies:   Recent Labs  03/27/16 1230  IRON 32*  TIBC 332   Studies/Results: Dg Chest Port 1 View  Result Date: 03/29/2016 CLINICAL DATA:  Status post right-sided dialysis catheter placement EXAM: PORTABLE CHEST 1 VIEW COMPARISON:  03/26/2016. FINDINGS: Right subclavian dialysis catheter with the tip projecting over the SVC. Hazy left midlung airspace disease which may reflect atelectasis versus pneumonia. There is no pleural effusion or pneumothorax. There is stable cardiomegaly. The osseous structures are unremarkable. IMPRESSION: Right subclavian dialysis catheter with the tip projecting over the SVC. Electronically Signed   By: Kathreen Devoid   On: 03/29/2016 12:18   Medications: Infusions: . sodium chloride 10 mL/hr at 03/29/16 0945    Scheduled Medications: . calcium acetate  1,334 mg Oral TID WC  . cholecalciferol  1,000 Units Oral Daily  .  darbepoetin (ARANESP) injection - DIALYSIS  100 mcg Intravenous Q Tue-HD  . diazepam  10 mg Oral Q6H  . fentaNYL  75 mcg Transdermal Q72H  . ferric gluconate (FERRLECIT/NULECIT) IV  125 mg Intravenous Daily  . hydrALAZINE  50 mg Oral TID  . insulin aspart  0-15 Units Subcutaneous TID WC  . insulin aspart  0-5 Units Subcutaneous QHS  . insulin glargine  40 Units Subcutaneous Q2200  . ipratropium-albuterol  3 mL Nebulization TID  . levofloxacin  500 mg Oral Q48H  . levothyroxine  88 mcg Oral QAC breakfast  . metoprolol  100 mg Oral BID  . multivitamin  1 tablet Oral QHS  . nicotine  14 mg Transdermal Daily  . predniSONE  40 mg Oral Q breakfast  . simvastatin  20 mg Oral q1800  . sodium chloride flush  3 mL Intravenous Q12H    have reviewed scheduled and prn medications.  Physical Exam: General: agitated- swearing Heart: tachy Lungs: dec BS at bases Abdomen: obese, soft Extremities: positive edema with bruising Dialysis Access: new PC and left AVF placed 10/3   03/30/2016,8:42 AM  LOS: 4 days

## 2016-03-30 NOTE — Progress Notes (Signed)
Subjective: Patient complaining of diffuse pain all over his body in both his arms and legs. Threatening to leave AMA if his pain is not better controlled. He received PRN dilaudid twice overnight.   Objective:  Vital signs in last 24 hours: Vitals:   03/30/16 0357 03/30/16 0400 03/30/16 0434 03/30/16 0500  BP: (!) 146/93 (!) 159/87    Pulse:      Resp: (!) 21 18 20    Temp: 98.2 F (36.8 C)     TempSrc: Oral     SpO2: 100% 99% 100%   Weight:    295 lb 6.7 oz (134 kg)  Height:       Physical Exam Constitutional: NAD, appears comfortable HEENT: Atraumatic, normocephalic. anicteric sclera.  Cardiovascular:RRR, no murmurs, rubs, or gallops.  Pulmonary/Chest:Bibasilar crackles and wheezing improving, breathing comfortably on room air. R IJ TDC in place with dressing.  Abdominal:Soft, non tender, non distended. +BS.  Extremities: Warm and well perfused. Distal pulses intact. 2+ pitting edema to bilateral knees with chronic overlying skin changes of discoloration and dermatitis. 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 New AV fistula left upper extremity, incision C/D/I. Neurological:A&Ox3, CN II - XII grossly intact.  Skin: Small black cirrcular lesion at previous biopsy site on mid back with surrounding scarring.  Psychiatric:Normal mood and affect  Assessment/Plan:  Acute on Chronic [id number] of HFpEF, last ECHO in May 2016, LV EF 55 - 60%. CXR on admission with pulmonary edema. Received 3 days of IV lasix with good urine output. Breathing improved with diuresis. Repeat ECHO this admission with mild systolic dysfunction, EF [id number], no wall motion abnormalities. Patient underwent dialysis yesterday. -- Cards following; appreciate recs -- Lasix discontinued -- Repeat dialysis today and tomorrow per nephrology  -- Continue metoprolol 100 mg BID  -- Home hydralazine increased to 50 TID today per cards    Acute on Chronic Renal Failure: Progressively  worsening renal function over the past few years, now with creatinine >6. He continues to have good UOP and is diuresing well with IV lasix. He is scheduled for outpatient nephrology follow up. Previous work up for glomerulonephritis in the setting of chronic hematuria and proteinuria has been negative. Missed his last nephrology appointment.  -- Nephrology following, appreciate recs -- Right IJ TDC and left upper extremity AV fistula placed yesterday per vascular  -- F/u Vascular in 4-6 weeks for LUE duplex  -- Received dialysis yesterday, second treatment today, third treatment tomorrow -- Avoid nephrotoxic meds -- Continue to monitor BMP  Chronic pain:On chronic opioids x 15 years per patient for diffuse "body pain". Prescribed fentanyl patch in June, verified with database. Prescribed oxy however patient reports associated nausea and vomitting. Patient has exhibited multiple red flags including constantly requesting IV medication and for medication to be "pushed quickly".  This morning patient appeared sedated (slurring his words, speaking with eyes closed) while requesting more medication. Threatening to leave AMA if pain is not better controlled. Discussed with patient safety concerns with over prescribing opioids.  Fentanyl patch not found on patient this morning - either fell off or likely removed for surgery yesterday. Patch replaced this morning per RN. Will treat with additional IV dilaudid today with plan to switch back to PO tomorrow. Patient aware.  -- Fentanyl patch 75 mcg Q72H -- IV Dilaudid 1 mg q6hrs prn for breakthrough pain  -- Follow up with PCP   COPD Exacerbation: Cough and diffuse wheezing on admission improving with breathing treatments. CXR with  left lower lobe consolidations concerning for PNA. Clinically improving.  -- Continue Duonebs q6h prn -- Prednisone 40mg  (last dose tomorrow) -- PO Levofloxacin 500 mg Q48 (last dose today)  Supratheurapeutic INR: Reversed. On  warfarin for stroke prophylaxis in the setting of chronic a-fib. INR 6.9 on admission and increased to 8.2 the following day. Reversed with IV Vit K for TDC and AV fistula palcement yesterday. INR 1.25 today.  -- Will restart warfarin per pharmacy (ok per vascular recs) -- Daily PT/INR   Chronic Atrial Fibrillation: Patient has been intermittently tachycardic however this is likely due to his duoneb treatments. Rate well controlled today on evaluation. Will continue to monitor.  -- Continue metoprolol 100 mg BID -- Restart warfarin per pharmacy   Hypokalemia: Resolved -- Continue tele -- Replete PRN  -- Daily BMP  Anemia: Worsening of his chronic normocytic anemia, likely due to progression of his CKD. -- EPO level 5.6 -- Nephro consult, appreciate recs -- Continue to monitor   DM2: On Lantus 30U daily at home. Patient reports allergy to Novolog consistent with vomiting. CBGs have been elevated to 300-400 range during this admission. Patient was given zofran with novolog yesterday prior to surgery and tolerated well. Started on SSI with meals.  -- Increased lantus to 40 U QHS -- SSI with meals and QHS, continue to monitor for N/V  Hypothyroidism: -- Continue home synthroid  Hyperlipidemia: -- Continue home statin   Anxiety: -- Continue home diazepam 10 mg Q6h   FEN: Fluid restrict, replete lytes prn, Ice chips VTE ppx: SCDs, restart warfarin per pharmacy, heparin 5000 units TID for ppx Code Status: FULL   Dispo: Anticipated discharge in approximately 2-3 day(s).   Velna Ochs, MD 03/30/2016, 7:10 AM Pager: [phone]

## 2016-03-30 NOTE — Progress Notes (Signed)
Patient Name: Johnny Sims Date of Encounter: 03/30/2016  Principal Problem:   Acute on chronic diastolic CHF (congestive heart failure), NYHA class 3 (HCC) Active Problems:   Hyperlipidemia   Essential hypertension   COPD exacerbation (HCC)   GERD (gastroesophageal reflux disease)   Atrial fibrillation status post cardioversion (HCC)   Type 2 diabetes mellitus with diabetic polyneuropathy, with long-term current use of insulin (HCC)   CKD (chronic kidney disease), stage V ([place])   Hypokalemia   Primary Cardiologist: Dr Acie Fredrickson Patient Profile: 60 yo male w/ hx PAF on coumadin, DM, HTN, HLD, COPD, RA, Hep C, bipolar, CKD V, GERD. Admitted 09/30 w/ CHF  SUBJECTIVE: Patient very upset today. Complains of a lot of pain. Mostly in arms and at surgical site.   OBJECTIVE Vitals:   03/30/16 0400 03/30/16 0434 03/30/16 0500 03/30/16 0700  BP: (!) 159/87   (!) 188/131  Pulse:      Resp: 18 20  (!) 26  Temp:    98.2 F (36.8 C)  TempSrc:    Oral  SpO2: 99% 100%    Weight:   295 lb 6.7 oz (134 kg)   Height:        Intake/Output Summary (Last 24 hours) at 03/30/16 0812 Last data filed at 03/30/16 0659  Gross per 24 hour  Intake          1949.83 ml  Output             1750 ml  Net           199.83 ml   Filed Weights   03/29/16 1230 03/29/16 1520 03/30/16 0500  Weight: 294 lb 8.6 oz (133.6 kg) 292 lb 8.8 oz (132.7 kg) 295 lb 6.7 oz (134 kg)    PHYSICAL EXAM General: Well developed, well nourished, male in no acute distress. Head: Normocephalic, atraumatic.  Neck: Supple without bruits, JVD not elevated. Lungs:  Resp regular and unlabored, rales bases, good air exchange. Heart: Irreg R&R, S1, S2, no S3, S4, or murmur; no rub. Abdomen: Soft, non-tender, non-distended, BS + x 4.  Extremities: No clubbing, cyanosis, edema. Lower legs are wrapped, not disturbed Neuro: Alert and oriented X 3. Moves all extremities spontaneously. Psych: Normal affect.  LABS: CBC:  Recent  Labs  03/29/16 0646 03/30/16 0500  WBC 16.1* 16.1*  HGB 8.6* 8.5*  HCT 26.3* 26.0*  MCV 88.0 88.4  PLT 150 135*   INR:  Recent Labs  03/30/16 0500  INR [id number]   Basic Metabolic Panel:  Recent Labs  03/28/16 0155 03/29/16 0505 03/30/16 0500  NA 136 131* 134*  K 3.2* 3.5 3.9  CL 98* 95* 98*  CO2 21* 20* 23  GLUCOSE 306* 338* 254*  BUN 166* 180* 132*  CREATININE 6.72* 6.64* 5.25*  CALCIUM 7.6* 8.0* 8.4*  PHOS 6.4*  --   --    Liver Function Tests:  Recent Labs  03/28/16 0155  ALBUMIN 3.1*   No results for input(s): TROPIPOC in the last 72 hours. BNP:  B Natriuretic Peptide  Date/Time Value Ref Range Status  03/26/2016 12:15 PM 894.3 (H) 0.0 - 100.0 pg/mL Final  10/07/2015 02:52 PM 604.9 (H) 0.0 - 100.0 pg/mL Final   Anemia Panel:  Recent Labs  03/27/16 1230  TIBC 332  IRON 32*    TELE:    Atrial fib, rate is well controlled.     ECHO: 10/02  - Left ventricle: The cavity size was mildly to moderately  dilated.   There was moderate concentric hypertrophy. Systolic function was   mildly reduced. The estimated ejection fraction was in the range   of 45% to 50%. Wall motion was normal; there were no regional   wall motion abnormalities. (EF 55-60% 10/2014) - Aortic valve: Transvalvular velocity was within the normal range.   There was no stenosis. There was no regurgitation. - Mitral valve: Calcified annulus. Transvalvular velocity was   within the normal range. There was no evidence for stenosis.   There was moderate regurgitation. - Left atrium: The atrium was severely dilated. - Right ventricle: The cavity size was normal. Wall thickness was   normal. Systolic function was normal. - Tricuspid valve: There was mild regurgitation. - Pulmonary arteries: Systolic pressure was severely increased. PA   peak pressure: 60 mm Hg (S). - Pericardium, extracardiac: There was a left pleural effusion.   Current Medications:  . calcium acetate  1,334 mg Oral  TID WC  . cholecalciferol  1,000 Units Oral Daily  . darbepoetin (ARANESP) injection - DIALYSIS  100 mcg Intravenous Q Tue-HD  . diazepam  10 mg Oral Q6H  . fentaNYL  75 mcg Transdermal Q72H  . ferric gluconate (FERRLECIT/NULECIT) IV  125 mg Intravenous Daily  . hydrALAZINE  25 mg Oral TID  . insulin aspart  0-15 Units Subcutaneous TID WC  . insulin aspart  0-5 Units Subcutaneous QHS  . insulin glargine  40 Units Subcutaneous Q2200  . ipratropium-albuterol  3 mL Nebulization TID  . levofloxacin  500 mg Oral Q48H  . levothyroxine  88 mcg Oral QAC breakfast  . metoprolol  100 mg Oral BID  . multivitamin  1 tablet Oral QHS  . nicotine  14 mg Transdermal Daily  . nitroGLYCERIN  0.5 inch Topical Q6H  . predniSONE  40 mg Oral Q breakfast  . simvastatin  20 mg Oral q1800  . sodium chloride flush  3 mL Intravenous Q12H   . sodium chloride 10 mL/hr at 03/29/16 0945   ASSESSMENT AND PLAN: 1. Acute on chronic CHF- diastolic parameters not listed (in 2016 or 2017), but EF is down slightly. Initiated dialysis yesterday but weight still up today. Ongoing dialysis will be needed to maintain volume status. Per Nephrology 2. Afib chronic. Rate is well controlled. Coumadin sub-therapeutic, Held for vascular access procedures- need to resume. 3. Chronic anemia. 4. CKD stage V. Per nephrology. 5. LVD - mild  6. HTN poorly controlled. Largely driven by pain and mental distress. Primary team to address. Will increase hydralazine.   Present on Admission: . Acute on chronic diastolic CHF (congestive heart failure), NYHA class 3 ([place]) . Atrial fibrillation status post cardioversion ([place]) . COPD exacerbation ([place]) . Hyperlipidemia . CKD (chronic kidney disease), stage V ([place]) . Hypokalemia . Essential hypertension . GERD (gastroesophageal reflux disease)   Signed,  Peter Martinique, [place] 03/30/2016 8:12 AM

## 2016-03-31 ENCOUNTER — Telehealth: Payer: Self-pay | Admitting: Vascular Surgery

## 2016-03-31 ENCOUNTER — Inpatient Hospital Stay (INDEPENDENT_AMBULATORY_CARE_PROVIDER_SITE_OTHER): Payer: Self-pay | Admitting: Orthopedic Surgery

## 2016-03-31 DIAGNOSIS — E1165 Type 2 diabetes mellitus with hyperglycemia: Secondary | ICD-10-CM

## 2016-03-31 DIAGNOSIS — N189 Chronic kidney disease, unspecified: Secondary | ICD-10-CM

## 2016-03-31 DIAGNOSIS — D631 Anemia in chronic kidney disease: Secondary | ICD-10-CM

## 2016-03-31 DIAGNOSIS — F419 Anxiety disorder, unspecified: Secondary | ICD-10-CM

## 2016-03-31 DIAGNOSIS — Z79891 Long term (current) use of opiate analgesic: Secondary | ICD-10-CM

## 2016-03-31 DIAGNOSIS — E039 Hypothyroidism, unspecified: Secondary | ICD-10-CM

## 2016-03-31 DIAGNOSIS — J441 Chronic obstructive pulmonary disease with (acute) exacerbation: Secondary | ICD-10-CM

## 2016-03-31 DIAGNOSIS — E785 Hyperlipidemia, unspecified: Secondary | ICD-10-CM

## 2016-03-31 DIAGNOSIS — N179 Acute kidney failure, unspecified: Secondary | ICD-10-CM

## 2016-03-31 DIAGNOSIS — E1122 Type 2 diabetes mellitus with diabetic chronic kidney disease: Secondary | ICD-10-CM

## 2016-03-31 DIAGNOSIS — G8929 Other chronic pain: Secondary | ICD-10-CM

## 2016-03-31 DIAGNOSIS — Z7901 Long term (current) use of anticoagulants: Secondary | ICD-10-CM

## 2016-03-31 DIAGNOSIS — I482 Chronic atrial fibrillation: Secondary | ICD-10-CM

## 2016-03-31 DIAGNOSIS — Z79899 Other long term (current) drug therapy: Secondary | ICD-10-CM

## 2016-03-31 LAB — PROTIME-INR
INR: 1.22
Prothrombin Time: 15.4 seconds — ABNORMAL HIGH (ref 11.4–15.2)

## 2016-03-31 LAB — GLUCOSE, CAPILLARY
Glucose-Capillary: 256 mg/dL — ABNORMAL HIGH (ref 65–99)
Glucose-Capillary: 246 mg/dL — ABNORMAL HIGH (ref 65–99)
Glucose-Capillary: 420 mg/dL — ABNORMAL HIGH (ref 65–99)

## 2016-03-31 LAB — HEPARIN LEVEL (UNFRACTIONATED): Heparin Unfractionated: 0.59 IU/mL (ref 0.30–0.70)

## 2016-03-31 MED ORDER — HEPARIN (PORCINE) IN NACL 100-0.45 UNIT/ML-% IJ SOLN
1700.0000 [IU]/h | INTRAMUSCULAR | Status: DC
  Administered 2016-03-31 (×2): 1700 [IU]/h via INTRAVENOUS
  Filled 2016-03-31 (×2): qty 250

## 2016-03-31 MED ORDER — HYDROMORPHONE HCL 2 MG PO TABS
1.0000 mg | ORAL_TABLET | ORAL | Status: DC | PRN
Start: 2016-03-31 — End: 2016-04-01
  Administered 2016-03-31 – 2016-04-01 (×2): 1 mg via ORAL
  Filled 2016-03-31 (×2): qty 1

## 2016-03-31 MED ORDER — WARFARIN SODIUM 2.5 MG PO TABS
12.5000 mg | ORAL_TABLET | Freq: Once | ORAL | Status: AC
  Administered 2016-03-31: 12.5 mg via ORAL
  Filled 2016-03-31: qty 1

## 2016-03-31 MED ORDER — HEPARIN BOLUS VIA INFUSION
4000.0000 [IU] | Freq: Once | INTRAVENOUS | Status: AC
  Administered 2016-03-31: 4000 [IU] via INTRAVENOUS
  Filled 2016-03-31: qty 4000

## 2016-03-31 MED ORDER — HEPARIN SODIUM (PORCINE) 1000 UNIT/ML DIALYSIS: 20.0000 [IU]/kg | INTRAMUSCULAR | Status: DC | PRN

## 2016-03-31 MED ORDER — HYDROMORPHONE HCL 2 MG PO TABS
1.0000 mg | ORAL_TABLET | Freq: Four times a day (QID) | ORAL | Status: DC | PRN
  Administered 2016-03-31 (×2): 1 mg via ORAL
  Filled 2016-03-31 (×2): qty 1

## 2016-03-31 MED ORDER — INSULIN ASPART 100 UNIT/ML ~~LOC~~ SOLN
10.0000 [IU] | Freq: Once | SUBCUTANEOUS | Status: AC
  Administered 2016-03-31: 10 [IU] via SUBCUTANEOUS

## 2016-03-31 NOTE — Progress Notes (Signed)
ANTICOAGULATION CONSULT NOTE -   Pharmacy Consult for coumadin and heparin  Indication: atrial fibrillation  Allergies  Allergen Reactions  . Novolog [Insulin Aspart] Other (See Comments)    UNSPECIFIED REACTION  "I'm just highly allergic"  . Hydrocodone-Acetaminophen Palpitations and Other (See Comments)    Doesn't want to take  . Iodides Other (See Comments)    Patient states he doesn't know what iodine is and doesn't think he is allergic to it despite it being listed with his allergies  . Other Hives and Other (See Comments)    Steroid creams "Corndogs"- Nausea & vomiting   . Sulfacetamide Sodium Hives and Itching  . Tylenol [Acetaminophen] Nausea And Vomiting  . Gabapentin Swelling    SWELLING REACTION UNSPECIFIED   . Oxycodone-Acetaminophen Nausea And Vomiting  . Antihistamines, Chlorpheniramine-Type Other (See Comments)    unknown  . Hydralazine Rash  . Iodinated Diagnostic Agents Other (See Comments)    Patient states he doesn't know what iodine is and doesn't think he is allergic to it despite it being listed with his allergies  . Latex Rash  . Lidocaine Itching and Other (See Comments)    Patient is uncertain of this allergy  . Pentazocine Other (See Comments)    unknown  . Pheniramine Other (See Comments)    unknown  . Sulfa Antibiotics Itching and Other (See Comments)  . Tape Rash  . Vicodin [Hydrocodone-Acetaminophen] Palpitations and Other (See Comments)    Doesn't want to take    Patient Measurements: Height: 6\' 10"  (208.3 cm) Weight: 292 lb 3.2 oz (132.5 kg) IBW/kg (Calculated) : 100.6   Vital Signs: Temp: 98.7 F (37.1 C) (10/05 0747) Temp Source: Oral (10/05 0747) BP: 162/108 (10/05 0747)  Labs:  Recent Labs  03/28/16 1202 03/29/16 0505 03/29/16 0646 03/30/16 0500 03/31/16 0348  HGB 8.6*  --  8.6* 8.5*  --   HCT 26.4*  --  26.3* 26.0*  --   PLT 143*  --  150 135*  --   LABPROT  --  17.5*  --  15.8* 15.4*  INR  --  1.42  --  1.25 1.22   CREATININE  --  6.64*  --  5.25*  --     Estimated Creatinine Clearance: 24 mL/min (by C-G formula based on SCr of 5.25 mg/dL (H)).   Assessment: 60 yo M on coumadin PTA for PAF.  Coumadin was reversed for portacath and AVF placement 10/4 for first HD treatment 10/4.  Pharmacy consulted to resume coumadin for afib on 10/4 and consulted to dose heparin for bridge therapy on 10/5.  Anemia with low iron stores - renal is giving IV iron and ESA.  INR on admit 6.9>8.2>Vit K 7.5 mg po>2.65 >Vit K  5 mg>1.42>1.25>1.22 7.5mg  total po vitamin K given on 10/1 and vitamin k 5 mg given 10/2; DDI with Levaquin. Coumadin reversed to place HD access, resumed 10/4 PTA dose: 7.5 mg on Sunday and Thursday and 10 mg AOD's   Goal of Therapy:  INR 2-3 Monitor platelets by anticoagulation protocol: Yes  Heparin level 0.3 - 0.7   Plan: heparin 4000 bolus, drip at 1700 units/hr, heparin level in 8 hrs Coum 12.5 x 1, give early please Daily HL, CBC, INR  Eudelia Bunch, Pharm.D. [id number] 03/31/2016 11:10 AM

## 2016-03-31 NOTE — Progress Notes (Signed)
Results for TIAWAN, AXLINE (MRN [id number]) as of 03/31/2016 21:44  Ref. Range 03/31/2016 21:13  Glucose-Capillary Latest Ref Range: 65 - 99 mg/dL 420 (H)  MD on call paged. Awaiting return call.

## 2016-03-31 NOTE — Progress Notes (Signed)
Subjective: Continues to complain of diffuse pain this morning, however, patient is sitting up on the side of the bed eating breakfast. Appears comfortable. No events overnight. Breathing is improving.   Objective:  Vital signs in last 24 hours: Vitals:   03/31/16 0246 03/31/16 0500 03/31/16 0747 03/31/16 0840  BP: (!) 148/94  (!) 162/108   Pulse:      Resp: 15  19   Temp: 98.7 F (37.1 C)  98.7 F (37.1 C)   TempSrc: Oral  Oral   SpO2: 100%  99% 99%  Weight:  292 lb 3.2 oz (132.5 kg)    Height:       Physical Exam Constitutional: NAD, appears comfortable Cardiovascular:RRR, no murmurs, rubs, or gallops.  Pulmonary/Chest:Bibasilar crackles and wheezing improved, breathing comfortably on room air. R IJ TDC in place with dressing.  Extremities: Warm and well perfused. Distal pulses intact.2+ pitting edema to bilateral knees with chronic overlying skin changes of discoloration and dermatitis - unchanged. AV fistula left upper extremity, incision C/D/I. Neurological:A&Ox3, CN II - XII grossly intact.  Psychiatric:Normal mood and affect  Assessment/Plan:  Acute on Chronic [id number] of HFpEF, last ECHO in May 2016, LV EF 55 - 60%. CXR on admission with pulmonary edema. Received 3 days of IV lasix with good urine output. Breathing improved with diuresis. Repeat ECHO this admission with mild systolic dysfunction, EF [id number], no wall motion abnormalities. Patient has received two days of dialysis thus far (10/3 and 10/4). -- Cards following; appreciate recs -- Lasix discontinued, HD per nephrology for volume management  -- Repeat dialysis today  -- Continue metoprolol 100 mg BID  -- Continue hydralazine increased to 50 TID per cards    Acute on Chronic Renal Failure: Progressively worsening renal function over the past few months, now with creatinine >6. He responded well to IV lasix for 3 days.  Patient had right IJ TDC and left upper extremity AV fistula placed (03/29/16) per  vascular.  -- Nephrology following, appreciate recs -- First dialysis session 10/3, second treatment 10/4, third treatment today; will plan for outpatient HD schedule T/Th/Sat per nephro  -- CLIP for OP dialysis arrangements pending -- F/u Vascular in 4-6 weeks for LUE duplex  -- Avoid nephrotoxic meds -- Continue to monitor BMP  Chronic pain:On chronic opioids x 15 years per patient for diffuse "body pain". Prescribed fentanyl patch in June, verified with database. Prescribed oxy however patient reports associated nausea and vomitting. Patient has exhibited multiple yellow flags including constantly requesting IV medication and for medication to be "pushed quickly".  This morning patient appeared sedated (slurring his words, speaking with eyes closed) while requesting more medication. Threatening to leave AMA if pain is not better controlled. Discussed with patient safety concerns with over prescribing opioids.  -- Fentanyl patch 75 mcg Q72H -- IV dilaudid changed to 1 mg PO q6 hrs today  -- Follow up with PCP   COPD Exacerbation: Cough and diffuse wheezing on admission improving with breathing treatments. CXR with left lower lobe consolidations concerning for PNA. Clinically improving.  -- Continue Duonebs q6h prn -- Prednisone 40mg  (last dose today) -- PO Levofloxacin 500 mg Q48 (last dose yesterday)  Anticoagulation: On warfarin for stroke prophylaxis in the setting of chronic a-fib. Supratherapeutic on admission requiring reversal for [facility] and AV fistula palcement on 10/3. Warfarin restarted yesterday per pharmacy, patient received one 10 mg dose. INR subtherapeutic 1.22 (goal 2-3).  -- Pharmacy consult; appreciate recs -- Daily PT/INR   Chronic Atrial  Fibrillation: Patient has been intermittently tachycardic however this is likely due to his duoneb treatments. Rate well controlled today on evaluation. Will continue to monitor.  -- Continue metoprolol 100 mg BID  -- Restart warfarin  per pharmacy   Hypokalemia: Resolved -- Continue tele -- Replete PRN  -- Daily BMP  Anemia: Worsening of his chronic normocytic anemia, likely due to progression of his CKD. -- EPO level 5.6; ESA started per nephro -- Continue to monitor   DM2: On Lantus 30U daily at home. Patient reports allergy to Novolog consistent with vomiting. CBGs have been elevated to 300-400 range during this admission. Patient was given zofran with novolog prior to surgery and tolerated well. Started on SSI with meals.  -- Increased lantus to 40U QHS -- SSI with meals and QHS, continue to monitor for N/V  Hypothyroidism: -- Continue home synthroid  Hyperlipidemia: -- Continue home statin   Anxiety: -- Continue home diazepam 10 mg Q6h   FEN: Fluid restrict, replete lytes prn, renal diet VTE ppx: SCDs, restart warfarin per pharmacy, heparin 5000 units TID for ppx Code Status: FULL   Dispo: Anticipated discharge in approximately 2-3 day(s).   Velna Ochs, MD 03/31/2016, 10:16 AM Pager: [phone]

## 2016-03-31 NOTE — Telephone Encounter (Signed)
LVM on home # lttr mailed for appt on 11/10 [place] & f/u with Doc

## 2016-03-31 NOTE — Progress Notes (Signed)
Subjective:  Tolerated HD yest- removed 3 liters- more agreeable today- wants to get up and move Objective Vital signs in last 24 hours: Vitals:   03/31/16 0240 03/31/16 0243 03/31/16 0246 03/31/16 0500  BP:  (!) 148/94 (!) 148/94   Pulse:      Resp: 12 17 15    Temp:   98.7 F (37.1 C)   TempSrc:   Oral   SpO2:  100% 100%   Weight:    132.5 kg (292 lb 3.2 oz)  Height:       Weight change: 0.4 kg (14.1 oz)  Intake/Output Summary (Last 24 hours) at 03/31/16 [id number] Last data filed at 03/31/16 0600  Gross per 24 hour  Intake          1303.17 ml  Output             4600 ml  Net         -3296.83 ml    Assessment/ Plan: Pt is a 60 y.o. yo male who was admitted on 03/26/2016 with progressive renal failure- now requiring initiation of dialysis   Assessment/Plan: 1. Renal- basically progressive renal failure likely secondary to DM vs hep C- now uremic and requiring initiation of HD-    PC and first stage BVT placed 10/3 followed by first HD treatment, second treatment 10/4- CLIP for OP dialysis arrangements.  Will do 4 K bath-  3rd treatment today, then Sat to keep on TTS schedule as anticipate that will be his OP schedule 2. HTN/vol- overloaded but improving - control with HD   - also still on hydralazine and lopressor- will likely be able to wean once gets established with HD- still have a ways to go 3. Anemia- hgb in the 8's  iron stores low- repleting and added ESA  4. Secondary hyperparathyroidism- phos 6.4- no recent PTH- added binder(phoslo)  PTH is 155- no vit D 5. Pain control/psych- is better but still impulsive.  Consider psych involvement to get him on a medication regimen that will allow him to negotiate things more successfully than he has in the past- otherwise looking at repeated hospitalizations and failure in his future - would also wean steroids as is likely not helping   Rhea Kaelin A    Labs: Basic Metabolic Panel:  Recent Labs Lab 03/28/16 0155  03/29/16 0505 03/30/16 0500  NA 136 131* 134*  K 3.2* 3.5 3.9  CL 98* 95* 98*  CO2 21* 20* 23  GLUCOSE 306* 338* 254*  BUN 166* 180* 132*  CREATININE 6.72* 6.64* 5.25*  CALCIUM 7.6* 8.0* 8.4*  PHOS 6.4*  --   --    Liver Function Tests:  Recent Labs Lab 03/26/16 1215 03/28/16 0155  AST 29  --   ALT 21  --   ALKPHOS 42  --   BILITOT 1.1  --   PROT 6.8  --   ALBUMIN 3.4* 3.1*   No results for input(s): LIPASE, AMYLASE in the last 168 hours. No results for input(s): AMMONIA in the last 168 hours. CBC:  Recent Labs Lab 03/26/16 1215 03/27/16 0319 03/28/16 0155 03/28/16 1202 03/29/16 0646 03/30/16 0500  WBC 13.9* 12.9* 11.0* 14.5* 16.1* 16.1*  NEUTROABS 12.1*  --   --   --   --   --   HGB 8.9* 8.8* 8.5* 8.6* 8.6* 8.5*  HCT 27.2* 27.4* 26.3* 26.4* 26.3* 26.0*  MCV 87.5 89.0 88.0 88.0 88.0 88.4  PLT 163 152 146* 143* 150 135*   Cardiac Enzymes:  No results for input(s): CKTOTAL, CKMB, CKMBINDEX, TROPONINI in the last 168 hours. CBG:  Recent Labs Lab 03/29/16 1718 03/29/16 1949 03/30/16 0744 03/30/16 1150 03/30/16 2104  GLUCAP 189* 254* 174* 273* 241*    Iron Studies:  No results for input(s): IRON, TIBC, TRANSFERRIN, FERRITIN in the last 72 hours. Studies/Results: Dg Chest Port 1 View  Result Date: 03/29/2016 CLINICAL DATA:  Status post right-sided dialysis catheter placement EXAM: PORTABLE CHEST 1 VIEW COMPARISON:  03/26/2016. FINDINGS: Right subclavian dialysis catheter with the tip projecting over the SVC. Hazy left midlung airspace disease which may reflect atelectasis versus pneumonia. There is no pleural effusion or pneumothorax. There is stable cardiomegaly. The osseous structures are unremarkable. IMPRESSION: Right subclavian dialysis catheter with the tip projecting over the SVC. Electronically Signed   By: Kathreen Devoid   On: 03/29/2016 12:18   Dg Fluoro Guide Cv Line Right  Result Date: 03/30/2016 CLINICAL DATA:  60 year old male with renal  failure EXAM: CENTRAL VENOUS CATHETER WITH FLUOROSCOPY CONTRAST:  None FLUOROSCOPY TIME:  Op note COMPARISON:  None. FINDINGS: Intraoperative fluoroscopic spot image. Partial visualization of right IJ approach hemodialysis catheter. The tip appears to terminate in the superior vena cava. IMPRESSION: Intraoperative spot image of hemodialysis catheter placement, with the tip appearing to terminate superior vena cava. Signed, Dulcy Fanny. Earleen Newport, DO [facility]iology Electronically Signed   By: Corrie Mckusick D.O.   On: 03/30/2016 14:54   Medications: Infusions: . sodium chloride 10 mL/hr at 03/29/16 0945    Scheduled Medications: . calcium acetate  1,334 mg Oral TID WC  . cholecalciferol  1,000 Units Oral Daily  . darbepoetin (ARANESP) injection - DIALYSIS  100 mcg Intravenous Q Tue-HD  . diazepam  10 mg Oral Q6H  . fentaNYL  75 mcg Transdermal Q72H  . ferric gluconate (FERRLECIT/NULECIT) IV  125 mg Intravenous Daily  . hydrALAZINE  50 mg Oral TID  . insulin aspart  0-15 Units Subcutaneous TID WC  . insulin aspart  0-5 Units Subcutaneous QHS  . insulin glargine  40 Units Subcutaneous Q2200  . ipratropium-albuterol  3 mL Nebulization TID  . levothyroxine  88 mcg Oral QAC breakfast  . metoprolol  100 mg Oral BID  . multivitamin  1 tablet Oral QHS  . nicotine  14 mg Transdermal Daily  . predniSONE  40 mg Oral Q breakfast  . simvastatin  20 mg Oral q1800  . sodium chloride flush  3 mL Intravenous Q12H  . Warfarin - Pharmacist Dosing Inpatient   Does not apply q1800    have reviewed scheduled and prn medications.  Physical Exam: General: more friendly - over the top  Heart: tachy Lungs: dec BS at bases Abdomen: obese, soft Extremities: positive edema with bruising Dialysis Access: new PC and left AVF placed 10/3   03/31/2016,7:42 AM  LOS: 5 days

## 2016-03-31 NOTE — Progress Notes (Signed)
Upon assessment pt's una boots were not on it, upon asking pt he said it was itching so he took it off. Will passed it on to day shift nurse since ortho tech is not present at night.

## 2016-03-31 NOTE — Progress Notes (Signed)
03/31/16  Pharmacy- heparin 2050   Heparin level 0.59 on 1700 units/hr  A/P:  60yo male with AFib, initiated on heparin bridge to Coumadin today.  Heparin level is within goal range on current rate.  No bleeding, per d/w RN.     1-  Continue on current rate 2-  F/U in AM   Gracy Bruins, PharmD [facility]

## 2016-03-31 NOTE — Telephone Encounter (Signed)
-----   Message from Mena Goes, RN sent at 03/30/2016  9:21 AM EDT ----- Regarding: schedule   ----- Message ----- From: Waynetta Sandy, MD Sent: 03/30/2016   9:13 AM To: Vvs Charge Pool  F/u in 4-6 weeks with left arm dialysis access duplex 1st stage bvt  -brandon

## 2016-03-31 NOTE — Progress Notes (Signed)
Patient Name: Johnny Sims Date of Encounter: 03/31/2016  Principal Problem:   ESRD on dialysis [facility]) Active Problems:   Hyperlipidemia   Essential hypertension   COPD exacerbation (HCC)   GERD (gastroesophageal reflux disease)   Atrial fibrillation status post cardioversion ([place])   Type 2 diabetes mellitus with diabetic polyneuropathy, with long-term current use of insulin ([place])   Hypokalemia   Primary Cardiologist: Dr Acie Fredrickson Patient Profile: 60 yo male w/ hx PAF on coumadin, DM, HTN, HLD, COPD, RA, Hep C, bipolar, CKD V, GERD. Admitted 09/30 w/ CHF  SUBJECTIVE: Feeling much better today. No chest pain or dyspnea. Pain control is better.  OBJECTIVE Vitals:   03/31/16 0246 03/31/16 0500 03/31/16 0747 03/31/16 0840  BP: (!) 148/94  (!) 162/108   Pulse:      Resp: 15  19   Temp: 98.7 F (37.1 C)  98.7 F (37.1 C)   TempSrc: Oral  Oral   SpO2: 100%  99% 99%  Weight:  292 lb 3.2 oz (132.5 kg)    Height:        Intake/Output Summary (Last 24 hours) at 03/31/16 0918 Last data filed at 03/31/16 0600  Gross per 24 hour  Intake          1303.17 ml  Output             4600 ml  Net         -3296.83 ml   Filed Weights   03/30/16 1508 03/30/16 1808 03/31/16 0500  Weight: 295 lb 6.7 oz (134 kg) 288 lb 12.8 oz (131 kg) 292 lb 3.2 oz (132.5 kg)    PHYSICAL EXAM General: Well developed, well nourished, male in no acute distress. Head: Normocephalic, atraumatic.  Neck: Supple without bruits, JVD not elevated. Lungs:  Resp regular and unlabored, rales bases, good air exchange. Heart: Irreg R&R, S1, S2, no S3, S4, or murmur; no rub. Abdomen: Soft, non-tender, non-distended, BS + x 4.  Extremities: No clubbing, cyanosis, edema. Lower legs are wrapped, not disturbed Neuro: Alert and oriented X 3. Moves all extremities spontaneously. Psych: Normal affect.  LABS: CBC:  Recent Labs  03/29/16 0646 03/30/16 0500  WBC 16.1* 16.1*  HGB 8.6* 8.5*  HCT 26.3* 26.0*  MCV  88.0 88.4  PLT 150 135*   INR:  Recent Labs  03/31/16 0348  INR [id number]   Basic Metabolic Panel:  Recent Labs  03/29/16 0505 03/30/16 0500  NA 131* 134*  K 3.5 3.9  CL 95* 98*  CO2 20* 23  GLUCOSE 338* 254*  BUN 180* 132*  CREATININE 6.64* 5.25*  CALCIUM 8.0* 8.4*   Liver Function Tests: No results for input(s): AST, ALT, ALKPHOS, BILITOT, PROT, ALBUMIN in the last 72 hours. No results for input(s): TROPIPOC in the last 72 hours. BNP:  B Natriuretic Peptide  Date/Time Value Ref Range Status  03/26/2016 12:15 PM 894.3 (H) 0.0 - 100.0 pg/mL Final  10/07/2015 02:52 PM 604.9 (H) 0.0 - 100.0 pg/mL Final   Anemia Panel: No results for input(s): VITAMINB12, FOLATE, FERRITIN, TIBC, IRON, RETICCTPCT in the last 72 hours.  TELE:    Atrial fib, rate is well controlled.     ECHO: 10/02  - Left ventricle: The cavity size was mildly to moderately dilated.   There was moderate concentric hypertrophy. Systolic function was   mildly reduced. The estimated ejection fraction was in the range   of 45% to 50%. Wall motion was normal; there were no  regional   wall motion abnormalities. (EF 55-60% 10/2014) - Aortic valve: Transvalvular velocity was within the normal range.   There was no stenosis. There was no regurgitation. - Mitral valve: Calcified annulus. Transvalvular velocity was   within the normal range. There was no evidence for stenosis.   There was moderate regurgitation. - Left atrium: The atrium was severely dilated. - Right ventricle: The cavity size was normal. Wall thickness was   normal. Systolic function was normal. - Tricuspid valve: There was mild regurgitation. - Pulmonary arteries: Systolic pressure was severely increased. PA   peak pressure: 60 mm Hg (S). - Pericardium, extracardiac: There was a left pleural effusion.   Current Medications:  . calcium acetate  1,334 mg Oral TID WC  . cholecalciferol  1,000 Units Oral Daily  . darbepoetin (ARANESP) injection  - DIALYSIS  100 mcg Intravenous Q Tue-HD  . diazepam  10 mg Oral Q6H  . fentaNYL  75 mcg Transdermal Q72H  . ferric gluconate (FERRLECIT/NULECIT) IV  125 mg Intravenous Daily  . hydrALAZINE  50 mg Oral TID  . insulin aspart  0-15 Units Subcutaneous TID WC  . insulin aspart  0-5 Units Subcutaneous QHS  . insulin glargine  40 Units Subcutaneous Q2200  . ipratropium-albuterol  3 mL Nebulization TID  . levothyroxine  88 mcg Oral QAC breakfast  . metoprolol  100 mg Oral BID  . multivitamin  1 tablet Oral QHS  . nicotine  14 mg Transdermal Daily  . simvastatin  20 mg Oral q1800  . sodium chloride flush  3 mL Intravenous Q12H  . Warfarin - Pharmacist Dosing Inpatient   Does not apply q1800   . sodium chloride 10 mL/hr at 03/29/16 0945   ASSESSMENT AND PLAN: 1. Acute on chronic CHF- diastolic parameters not listed (in 2016 or 2017), but EF is down slightly. Initiated dialysis yesterday but weight still up today. Ongoing dialysis will be needed to maintain volume status. Per Nephrology 2. Afib chronic. Rate is well controlled. Coumadin sub-therapeutic, Held for vascular access procedures- now resumed. 3. Chronic anemia. 4. CKD stage V. Per nephrology. 5. LVD - mild  6. HTN on high dose metoprolol and hydralazine. Should improve with further improvement in volume status with dialysis.   Stable from a cardiac standpoint. We will sign off now. Please call with questions.   Present on Admission: . Acute on chronic diastolic CHF (congestive heart failure), NYHA class 3 ([place]) . Atrial fibrillation status post cardioversion ([place]) . COPD exacerbation ([place]) . Hyperlipidemia . CKD (chronic kidney disease), stage V ([place]) . Hypokalemia . Essential hypertension . GERD (gastroesophageal reflux disease)   Signed,  Gilmore List [place], [place] 03/31/2016 9:18 AM

## 2016-04-01 DIAGNOSIS — I509 Heart failure, unspecified: Secondary | ICD-10-CM

## 2016-04-01 LAB — CBC
HCT: 28.8 % — ABNORMAL LOW (ref 39.0–52.0)
HEMOGLOBIN: 9.1 g/dL — AB (ref 13.0–17.0)
MCH: 28.7 pg (ref 26.0–34.0)
MCHC: 31.6 g/dL (ref 30.0–36.0)
MCV: 90.9 fL (ref 78.0–100.0)
PLATELETS: 120 10*3/uL — AB (ref 150–400)
RBC: 3.17 MIL/uL — AB (ref 4.22–5.81)
RDW: 19.2 % — ABNORMAL HIGH (ref 11.5–15.5)
WBC: 16 10*3/uL — AB (ref 4.0–10.5)

## 2016-04-01 LAB — BASIC METABOLIC PANEL
Anion gap: 15 (ref 5–15)
BUN: 71 mg/dL — ABNORMAL HIGH (ref 6–20)
CHLORIDE: 95 mmol/L — AB (ref 101–111)
CO2: 25 mmol/L (ref 22–32)
Calcium: 8.6 mg/dL — ABNORMAL LOW (ref 8.9–10.3)
Creatinine, Ser: 3.59 mg/dL — ABNORMAL HIGH (ref 0.61–1.24)
GFR, EST AFRICAN AMERICAN: 20 mL/min — AB (ref >60)
GFR, EST NON AFRICAN AMERICAN: 17 mL/min — AB (ref >60)
Glucose, Bld: 246 mg/dL — ABNORMAL HIGH (ref 65–99)
POTASSIUM: 4 mmol/L (ref 3.5–5.1)
SODIUM: 135 mmol/L (ref 135–145)

## 2016-04-01 LAB — GLUCOSE, CAPILLARY
GLUCOSE-CAPILLARY: 214 mg/dL — AB (ref 65–99)
GLUCOSE-CAPILLARY: 292 mg/dL — AB (ref 65–99)
GLUCOSE-CAPILLARY: 370 mg/dL — AB (ref 65–99)
Glucose-Capillary: 239 mg/dL — ABNORMAL HIGH (ref 65–99)
Glucose-Capillary: 214 mg/dL — ABNORMAL HIGH (ref 65–99)

## 2016-04-01 LAB — PROTIME-INR
INR: 1.34
PROTHROMBIN TIME: 16.7 s — AB (ref 11.4–15.2)

## 2016-04-01 LAB — HEPARIN LEVEL (UNFRACTIONATED): HEPARIN UNFRACTIONATED: 0.4 [IU]/mL (ref 0.30–0.70)

## 2016-04-01 MED ORDER — HYDROMORPHONE HCL 2 MG PO TABS
1.0000 mg | ORAL_TABLET | Freq: Once | ORAL | Status: AC
  Administered 2016-04-01: 1 mg via ORAL
  Filled 2016-04-01: qty 1

## 2016-04-01 MED ORDER — INSULIN GLARGINE 100 UNIT/ML ~~LOC~~ SOLN
45.0000 [IU] | Freq: Every day | SUBCUTANEOUS | Status: DC
  Administered 2016-04-01: 45 [IU] via SUBCUTANEOUS
  Filled 2016-04-01 (×2): qty 0.45

## 2016-04-01 MED ORDER — INSULIN ASPART 100 UNIT/ML ~~LOC~~ SOLN
5.0000 [IU] | Freq: Three times a day (TID) | SUBCUTANEOUS | Status: DC
  Administered 2016-04-01 – 2016-04-02 (×4): 5 [IU] via SUBCUTANEOUS

## 2016-04-01 MED ORDER — HYDROMORPHONE HCL 2 MG PO TABS
2.0000 mg | ORAL_TABLET | Freq: Four times a day (QID) | ORAL | Status: DC | PRN
  Administered 2016-04-01 – 2016-04-02 (×4): 2 mg via ORAL
  Filled 2016-04-01 (×4): qty 1

## 2016-04-01 MED ORDER — WARFARIN SODIUM 2.5 MG PO TABS
12.5000 mg | ORAL_TABLET | Freq: Once | ORAL | Status: AC
  Administered 2016-04-01: 12.5 mg via ORAL
  Filled 2016-04-01: qty 1

## 2016-04-01 MED ORDER — HYDROMORPHONE HCL 2 MG PO TABS: 1.0000 mg | ORAL_TABLET | Freq: Four times a day (QID) | ORAL | Status: DC | PRN

## 2016-04-01 NOTE — Progress Notes (Signed)
ANTICOAGULATION CONSULT NOTE - Follow Up Consult  Pharmacy Consult for coumadin and heparin  Indication: atrial fibrillation  Allergies  Allergen Reactions  . Novolog [Insulin Aspart] Other (See Comments)    UNSPECIFIED REACTION  "I'm just highly allergic"  . Hydrocodone-Acetaminophen Palpitations and Other (See Comments)    Doesn't want to take  . Iodides Other (See Comments)    Patient states he doesn't know what iodine is and doesn't think he is allergic to it despite it being listed with his allergies  . Other Hives and Other (See Comments)    Steroid creams "Corndogs"- Nausea & vomiting   . Sulfacetamide Sodium Hives and Itching  . Tylenol [Acetaminophen] Nausea And Vomiting  . Gabapentin Swelling    SWELLING REACTION UNSPECIFIED   . Oxycodone-Acetaminophen Nausea And Vomiting  . Antihistamines, Chlorpheniramine-Type Other (See Comments)    unknown  . Hydralazine Rash  . Iodinated Diagnostic Agents Other (See Comments)    Patient states he doesn't know what iodine is and doesn't think he is allergic to it despite it being listed with his allergies  . Latex Rash  . Lidocaine Itching and Other (See Comments)    Patient is uncertain of this allergy  . Pentazocine Other (See Comments)    unknown  . Pheniramine Other (See Comments)    unknown  . Sulfa Antibiotics Itching and Other (See Comments)  . Tape Rash  . Vicodin [Hydrocodone-Acetaminophen] Palpitations and Other (See Comments)    Doesn't want to take    Patient Measurements: Height: 6\' 10"  (208.3 cm) Weight: 283 lb 1.1 oz (128.4 kg) IBW/kg (Calculated) : 100.6  Vital Signs: Temp: 98 F (36.7 C) (10/05 2114) BP: 157/84 (10/06 0456) Pulse Rate: 97 (10/06 0456)  Labs:  Recent Labs  03/30/16 0500 03/31/16 0348 03/31/16 1940 04/01/16 0409  HGB 8.5*  --   --  9.1*  HCT 26.0*  --   --  28.8*  PLT 135*  --   --  120*  LABPROT 15.8* 15.4*  --  16.7*  INR 1.25 1.22  --  1.34  HEPARINUNFRC  --   --  0.59  0.40  CREATININE 5.25*  --   --  3.59*   Estimated Creatinine Clearance: 34.6 mL/min (by C-G formula based on SCr of 3.59 mg/dL (H)).  Assessment: 60 yo M on coumadin PTA for PAF.  Coumadin was reversed for portacath and AVF placement 10/4 for first HD treatment 10/4.  Pharmacy consulted to resume coumadin for afib on 10/4 and consulted to dose heparin for bridge therapy on 10/5.  Anemia with low iron stores - patient receiving  IV iron and ESA. HgB 9.1, Plt 120 - no reported bleeding or IV issues  INR Today: 1.34 and heparin level is therapeutic 0.40  Noted interaction with warfarin, Levaquin, and simvastatin.  PTA dose: 7.5 mg on Sunday and Thursday and 10 mg AOD's   Goal of Therapy:  INR 2-3 Monitor platelets by anticoagulation protocol: Yes  Heparin level 0.3 - 0.7  Plan:  Heparin 1700 units/hr Coum 12.5mg  x 1 Daily HL, CBC, INR  Georga Bora, PharmD Clinical Pharmacist Pager: [phone] 04/01/2016 8:42 AM

## 2016-04-01 NOTE — Progress Notes (Signed)
Subjective: Patient is very upset about his pain control this morning and threatening to leave AMA if he does not receive IV pain medicine. Patient says he will refuse dialysis if he doesn't not receive medication. Will not answer any other questions until I address his pain control.   Objective:  Vital signs in last 24 hours: Vitals:   03/31/16 1634 03/31/16 2114 04/01/16 0456 04/01/16 0821  BP: (!) 147/102 (!) 152/78 (!) 157/84   Pulse: 89 98 97   Resp: 14 (!) 21 20   Temp: 97.6 F (36.4 C) 98 F (36.7 C)    TempSrc: Oral     SpO2: 100% 98% 98% 98%  Weight: 283 lb 1.1 oz (128.4 kg)     Height:       Physical Exam Constitutional: NAD, appears comfortable Cardiovascular:RRR, no murmurs, rubs, or gallops.  Pulmonary/Chest:Wheezing improved, breathing comfortably on room air. R IJ TDC in place.  Extremities: Warm and well perfused. Distal pulses intact.2+ pitting edema to bilateral knees improving. Chronic overlying skin changes of discoloration and dermatitis - unchanged. AV fistula left upper extremity, incision C/D/I. Neurological:A&Ox3, CN II - XII grossly intact.  Psychiatric:Normal mood and affect  Assessment/Plan:  Hypervolemia:In the setting of chronic CHF and progression of his CKD. Patient had right IJ TDC and left upper extremity AV fistula placed (03/29/16) per vascular. Now s/p three HD session (10/3, 10/4, 10/5). Breathing and lower extremity swelling has improved.  -- Cards and nephrology following; appreciate recs -- Repeat HD tomorrow per nephrology  -- Discharge pending CLIP for outpatient dialysis -- F/u Vascular in 4-6 weeks for LUE duplex   Acute on Chronic CHF:  Repeat ECHO this admission with mild systolic dysfunction, EF [id number]. Dialysis per nephrology for volume management as above. -- Continue metoprolol 100 mg BID  -- Continue hydralazine increased to 50 TID per cards   COPD Exacerbation: Clinically improved.   -- Continue Duonebs q6h prn --  S/p 5 day course of prednisone and levofloxacin   Chronic pain:On chronic opioids x 15 years per patient for diffuse "body pain". Prescribed fentanyl patch in June, verified with database. Prescribed oxy however patient reports associated nausea and vomitting. Patient has exhibited multiple yellow flags including constantly requesting IV medication and for medication to be "pushed quickly". Patient is verbally abusive to staff when he does not receive his medication and continues to threaten to leave AMA and refuse dialysis if pain is not better controlled. Again, patient was counseled on his pain regimen this morning and our concern for over prescribing opiods. It was explained to patient that IV pain medication is not appropriate in this circumstance and that we cannot discharge him on IV medicine. Patient expressed that his pain has been worse since being in the hospital and he was reassured that he is receiving a higher dose of pain medication here than he is on at home. Counseled patient that our goal is not for him to be pain free, but for his pain to be tolerable. Patient says he has an appointment with a pain clinic on November 6th. Given his inappropriate and unpredictable behavior surrounding his opioid use, there is concern that patient will not be successful with outpatient dialysis. Psych evaluation was offered to patient this morning given recent life changes with starting dialysis and patient has agreed.  -- Fentanyl patch 75 mcg Q72H -- Increased dilaudid 2mg  PO q6hrs prn -- Gave additional 1 mg po dilaudid this morning -- Psych consult  -- Follow  up with PCP and pain clinic   Anticoagulation: On warfarin for stroke prophylaxis in the setting of chronic a-fib. Supratherapeutic on admission requiring reversal for [facility] and AV fistula palcement on 10/3. Warfarin restarted per pharmacy, INR subtherapeutic 1.34 (goal 2-3).  -- Pharmacy consult; appreciate recs -- Daily PT/INR   DM2: On  Lantus 30U daily at home. Patient reported an allergy to Novolog consistent with vomiting however has been tolerating it well this admission. CBG 420 overnight requiring additional 10 units. Will increase regimen for better control.  -- Increased lantus to 45U QHS -- Added 5 units Novolog with meals in addition to SSI   Chronic Atrial Fibrillation: Rates well controlled.  -- Continue metoprolol100 mg BID  -- Warfarin per pharmacy   Anemia: Progression of his chronic normocytic anemia, likely due to CKD. -- EPO level 5.6; ESA started per nephro -- Continue to monitor   Hypothyroidism: -- Continue home synthroid  Hyperlipidemia: -- Continue home statin   Anxiety: -- Continue home diazepam 10 mg Q6h   FEN: Fluid restrict, replete lytes prn, renal diet VTE ppx: SCDs, restart warfarin per pharmacy Code Status: FULL   Dispo: Anticipated discharge in approximately 2-3 day(s).   Velna Ochs, MD 04/01/2016, 10:24 AM Pager: [phone]

## 2016-04-01 NOTE — Care Management Note (Addendum)
Case Management Note  Patient Details  Name: CANDY ARAMBUL MRN: [id number] Date of Birth: May 02, 1956  Subjective/Objective:   Renal failure, New dialysis                 Action/Plan: Discharge Planning:  NCM spoke to pt and brother, Aldin Vorwald [phone] at bedside. Pt states he uses Medicaid Transportation # [phone] . Pt waiting clip process for dialysis. Waiting scheduled and center location. CSW referral for assistance arranging transportation for dialysis.    1248 CLIP process complete. Pt scheduled at [facility]. Pt will be provided information on time, address and phone number of [place].    PCP -Virginia Rochester MD  Expected Discharge Date:  04/02/2016              Expected Discharge Plan:  Home/Self Care  In-House Referral:  NA  Discharge planning Services  CM Consult  Post Acute Care Choice:  NA Choice offered to:  NA  DME Arranged:  N/A DME Agency:  NA  HH Arranged:  NA HH Agency:  NA  Status of Service:  Completed, signed off  If discussed at [place] of Stay Meetings, dates discussed:    Additional Comments:  Erenest Rasher, RN 04/01/2016, 11:50 AM

## 2016-04-01 NOTE — Progress Notes (Signed)
Patient able to draw insulin and self injected to herself several times.Educated for al;ternate injection sites,she said" im very well aware of that ,nurse educated me, watched videos and received a diabetes booklet 'Living with Diabetes.

## 2016-04-01 NOTE — Discharge Summary (Signed)
Name: Johnny Sims MRN: [id number] DOB: 1955/12/18 60 y.o. PCP: Johnny Rochester, PA  Date of Admission: 03/26/2016 11:39 AM Date of Discharge: 04/02/2016 Attending Physician: Dr. Gilles Sims   Discharge Diagnosis: 1. Hypervolemia, now ESRD on HD 2. COPD exacerbation   Discharge Medications:   Medication List    STOP taking these medications   cephALEXin 500 MG capsule Commonly known as:  KEFLEX   fentaNYL 75 MCG/HR Commonly known as:  DURAGESIC - dosed mcg/hr   furosemide 80 MG tablet Commonly known as:  LASIX   metolazone 2.5 MG tablet Commonly known as:  ZAROXOLYN   multivitamin tablet     TAKE these medications   albuterol 108 (90 Base) MCG/ACT inhaler Commonly known as:  PROVENTIL HFA;VENTOLIN HFA Inhale 2 puffs into the lungs every 4 (four) hours as needed for wheezing or shortness of breath.   albuterol (2.5 MG/3ML) 0.083% nebulizer solution Commonly known as:  PROVENTIL Take 2.5 mg by nebulization 4 (four) times daily.   calcium acetate 667 MG capsule Commonly known as:  PHOSLO Take 2 capsules (1,334 mg total) by mouth 3 (three) times daily with meals.   cholecalciferol 1000 units tablet Commonly known as:  VITAMIN D Take 1,000 Units by mouth daily.   COUMADIN 7.5 MG tablet Generic drug:  warfarin Take as directed by Coumadin clinic. What changed:  how much to take  how to take this  additional instructions   COUMADIN 10 MG tablet Generic drug:  warfarin Take as directed by the Coumadin clinic. What changed:  how much to take  how to take this  when to take this  additional instructions   Darbepoetin Alfa 100 MCG/0.5ML Sosy injection Commonly known as:  ARANESP Inject 0.5 mLs (100 mcg total) into the vein every Tuesday with hemodialysis.   diazepam 10 MG tablet Commonly known as:  VALIUM Take 10 mg by mouth every 4 (four) hours as needed for anxiety.   hydrALAZINE 50 MG tablet Commonly known as:  APRESOLINE Take 1 tablet (50  mg total) by mouth 3 (three) times daily. What changed:  medication strength  how much to take   Insulin Glargine 100 UNIT/ML Solostar Pen Commonly known as:  LANTUS SOLOSTAR Inject 30 Units into the skin daily at 10 pm.   levothyroxine 88 MCG tablet Commonly known as:  SYNTHROID Take 1 tablet (88 mcg total) by mouth daily before breakfast. Will send additional refills based off blood level What changed:  additional instructions   metoprolol 100 MG tablet Commonly known as:  LOPRESSOR Take 1 tablet (100 mg total) by mouth 2 (two) times daily. What changed:  medication strength  how much to take  when to take this   multivitamin Tabs tablet Take 1 tablet by mouth at bedtime.   nicotine 14 mg/24hr patch Commonly known as:  NICODERM CQ - dosed in mg/24 hours Place 14 mg onto the skin daily. What changed:  Another medication with the same name was removed. Continue taking this medication, and follow the directions you see here.   omeprazole 20 MG capsule Commonly known as:  PRILOSEC Take 1 capsule (20 mg total) by mouth daily. Take with Harvoni at the same time under FASTING condition What changed:  additional instructions   oxyCODONE 15 MG immediate release tablet Commonly known as:  ROXICODONE Take 1 tablet (15 mg total) by mouth every 3 (three) hours as needed for pain.   psyllium 95 % Pack Commonly known as:  HYDROCIL/METAMUCIL Take 1 packet  by mouth daily.   simvastatin 20 MG tablet Commonly known as:  ZOCOR Take 1 tablet (20 mg total) by mouth daily at 6 PM. What changed:  how much to take   [place] 18 MCG inhalation capsule Generic drug:  tiotropium INHALE CONTENTS OF ONE CAPSULE ONCE A DAY   SURE COMFORT PEN NEEDLES 31G X 8 MM Misc Generic drug:  Insulin Pen Needle USE ONE NIGHTLY       Disposition and follow-up:   Mr.Johnny Sims was discharged from [facility] in Stable condition.  At the hospital follow up visit  please address:  1.  Pain control: Please follow up with patient on his pain regimen. Discharged with a 7 day supply of his home oxycodone. We did not prescribe further fentanyl patches as this would be better addressed by his PCP. Patient says he has an appointment with a pain clinic on November 6th.   2. DM: Patient reported an allergy to Novolog consistent with nausea and vomiting, however tolerated it well during his admission. On discharge patient's lantus had been increased to 45 units QHS. He was receiving 5 units of Novolog with meals in addition to SSI (on average 10-15 units total with meals) and continued to be hyperglycemic in the 200-300 range. He would likely benefit from meal coverage as an outpatient.   3. Dialysis: Initiated this admission. He completed four HD session this hospitalization (10/3, 10/4, 10/5, and 10/7) with plans for Tu/Th/Sat outpatient dialysis. He will need to follow up with vascular in 4-6 weeks for LUE ultrasound. He had a right IJ TDC and left upper extremity AV fistula placed on 03/29/16.  4. Anticoagulation:  On warfarin for stroke prophylaxis in the setting of chronic afib. Supratherapeutic on admission with INR 6.9. Patient required reversal with IV vit K for his [facility] and fistula placement. Suptherapeutic on discharge, INR 1.67 (goal 2-3). Patient was discharged on his home regimen (10mg  daily except 7.5mg  Sun and Thursday).  5. HTN: Home hydralazine was increased to 50 TID per cardiology recommendations   6. Atrial fibrillation: Home metoprolol was increased to 100 mg BID  7.  Labs / imaging needed at time of follow-up: INR  8.  Pending labs/ test needing follow-up: None  Follow-up Appointments: Follow-up Information    Johnny Snare, MD Follow up in 6 week(s).   Specialties:  Vascular Surgery, Cardiology Why:  Office will call you to arrange your appt (sent) Contact information: [address] [place] [address] [place] by problem list: Principal Problem:   ESRD on dialysis [facility]) Active Problems:   Hyperlipidemia   Essential hypertension   COPD exacerbation ([place])   GERD (gastroesophageal reflux disease)   Atrial fibrillation status post cardioversion [facility])   Type 2 diabetes mellitus with diabetic polyneuropathy, with long-term current use of insulin (HCC)   Hypokalemia   Congestive heart failure ([place])   1. ESRD on HD: Patient presented with hypervolemia in the setting of progression of his CKD as well as acute on chronic HFpEF. He complained of worsening DOE and was found to be volume overloaded on exam with bilateral lower extremity edema and bibasilar crackles. CXR was significant for diffuse interstitial edema and cardiomegaly. On chart review it was noted that his renal function had been progressively worsening over the past few months now with creatinine > 6 on admission. He responded well to IV lasix for 3 days with  good UOP. Nephrology was consulted for progression of his CKD and the decision was made to pursue hemodialysis this admission. He had a right IJ TDC and left upper extremity AV fistula placed on 03/29/16 per vascular surgery. He received four session of HD (10/3, 10/4, 10/5, and 10/7) prior to discharge. He was set up for outpatient dialysis with plans for Tu/Th/Sa schedule. He will need to follow up with vascular in 4-6 weeks for LUE duplex.   2. COPD exacerbation: Patient presented with cough and diffuse wheezing on exam. CXR revealed a left lower lobe consolidation concerning for PNA. He was treated with 5 days of prednisone 40 mg and Levofloxacin Q48 hours (3 doses total). He also received duonebs q6 hours prn and improved clinically.   3. Anticoagulation: On warfarin for stroke prophylaxis in the setting of chronic a-fib. Supratherapeutic on admission requiring reversal for [facility] and AV fistula palcement on 10/3. Warfarin was restarted per pharmacy however patient was  subtherapeutic on discharge (INR 1.67). His home dosing was resumed on discharge (10mg  daily except 7.5mg  Sun and Thursday) with plans for INR check early next week with PCP or at dialysis.   4. DM: On Lantus 30U daily at home. Patient reported an allergy to Novolog consistent with vomiting however has been tolerating it well this admission. On discharge patient's lantus had been increased to 45 units QHS. He was receiving 5 units of Novolog with meals in addition to SSI. Patient would likely benefit from meal coverage at an outpatient.   5. Atrial Fibrillation: Patient's home metoprolol was increased to 100 mg BID due to intermittent tachycardia, and rates remained well controlled at this dose.   6. HTN: Patient continued to be hypertensive on his home hydralazine dose despite an increase in his metoprolol. Hydralazine was increased to 50 TID per cardiology recommendations with good BP response.   7. Chronic Pain: On chronic opioids x 15 years per patient for diffuse "body pain" and fibromyalgia. Patient says he was prescribed fentanyl patches in June, verified with database. He is on oxycodone has an outpatient however patient reported associated nausea and vomiting so this regimen was not continued initially. Patient exhibited multiple yellow flags during his hospitalization including constantly requesting IV medication and for medication to be "pushed quickly". Patient was continuously abusive to staff members when he did not receive his medication or when he felt that his pain was not adequately controlled. He threatened to leave AMA on multiple occasions and to threatened refuse dialysis if he did not receive the medication he wanted. Patient was counseled multiples times on his inappropriate behavior and our concerns with over prescribing opioids. There was concern that patient would not be successful with outpatient dialysis given his inappropriate and unpredictable behavior surrounding opioids.  Psych was consulted however did not get a chance to evaluate the patient prior to discharge. Patient may benefit from outpatient evaluation. He was treated with a fentanyl patch 75 mcg q72hrs as well as po dilaudid for breakthrough pain. He received prn IV dilaudid for one day following his procedures and was subsequently switched back to po.  His home regimen was not continued initially due to reported nausea and vomiting with oxycodone. But after discussion, patient was agreeable to resuming his home oxycodone as these symptoms may have been from progression of his renal disease rather than a side effect of the medicine. Patient was provided with a 7 day supply of his oxycodone. We did not prescribe further fentanyl patches as this would  be better addressed by his PCP. He says he has an appointment on November 6th with a pain clinic. Encouraged patient to keep appointment as well as follow up with his PCP.   Discharge Vitals:   BP 136/69 (BP Location: Right Arm)   Pulse (!) 113   Temp 98.5 F (36.9 C) (Oral)   Resp 17   Ht 6\' 10"  (2.083 m)   Wt 292 lb 12.3 oz (132.8 kg)   SpO2 99%   BMI 30.61 kg/m   Pertinent Labs, Studies, and Procedures:   03/26/16 CXR 2 View: IMPRESSION: 1. Cardiomegaly with pulmonary edema pattern consistent with CHF/volume overload. 2. A more confluent dense opacity at the left lung base (lingula) could be asymmetric alveolar pulmonary edema or superimposed pneumonia. In the absence of fever, favor asymmetric pulmonary edema. 3. Probable small left pleural effusion.  03/28/16 TTE:  Study Conclusions - Left ventricle: The cavity size was mildly to moderately dilated.   There was moderate concentric hypertrophy. Systolic function was   mildly reduced. The estimated ejection fraction was in the range   of 45% to 50%. Wall motion was normal; there were no regional   wall motion abnormalities. - Aortic valve: Transvalvular velocity was within the normal range.    There was no stenosis. There was no regurgitation. - Mitral valve: Calcified annulus. Transvalvular velocity was   within the normal range. There was no evidence for stenosis.   There was moderate regurgitation. - Left atrium: The atrium was severely dilated. - Right ventricle: The cavity size was normal. Wall thickness was   normal. Systolic function was normal. - Tricuspid valve: There was mild regurgitation. - Pulmonary arteries: Systolic pressure was severely increased. PA   peak pressure: 60 mm Hg (S). - Pericardium, extracardiac: There was a left pleural effusion.  Discharge Instructions: Discharge Instructions    (HEART FAILURE PATIENTS) Call MD:  Anytime you have any of the following symptoms: 1) 3 pound weight gain in 24 hours or 5 pounds in 1 week 2) shortness of breath, with or without a dry hacking cough 3) swelling in the hands, feet or stomach 4) if you have to sleep on extra pillows at night in order to breathe.    Complete by:  As directed    Call MD for:  persistant dizziness or light-headedness    Complete by:  As directed    Call MD for:  persistant nausea and vomiting    Complete by:  As directed    Call MD for:  severe uncontrolled pain    Complete by:  As directed    Call MD for:  temperature >100.4    Complete by:  As directed    Diet - low sodium heart healthy    Complete by:  As directed    Discharge instructions    Complete by:  As directed    We have given you a 7 day supply of your Oxycodone pain medication 15mg  tablets.  It will be your responsibility to call your primary care doctor on Monday to schedule a hospital follow up appointment to get more medications.  Please resume your Warfarin dosing per your old schedule...10mg  daily except 7.5mg  Sun and Thursday.  You will need to have your PT and INR checked early next week by your primary doctor or at dialysis as well.   Increase activity slowly    Complete by:  As directed       Signed: Velna Ochs, MD 04/05/2016, 9:10 AM  Pager: [phone]

## 2016-04-01 NOTE — Progress Notes (Signed)
Subjective:  Tolerated 3rd  HD yest- removed 2.8 liters- angry that his pain is not being controlled- had our  MD on call paged last night for pain med- says if he does not get "shot" he wont be back to HD- sugar high  Objective Vital signs in last 24 hours: Vitals:   03/31/16 1634 03/31/16 2114 04/01/16 0456 04/01/16 0821  BP: (!) 147/102 (!) 152/78 (!) 157/84   Pulse: 89 98 97   Resp: 14 (!) 21 20   Temp: 97.6 F (36.4 C) 98 F (36.7 C)    TempSrc: Oral     SpO2: 100% 98% 98% 98%  Weight: 128.4 kg (283 lb 1.1 oz)     Height:       Weight change: -2.8 kg (-6 lb 2.8 oz)  Intake/Output Summary (Last 24 hours) at 04/01/16 0945 Last data filed at 04/01/16 [id number]  Gross per 24 hour  Intake           785.35 ml  Output             3279 ml  Net         -2493.65 ml    Assessment/ Plan: Pt is a 60 y.o. yo male who was admitted on 03/26/2016 with progressive renal failure- now requiring initiation of dialysis   Assessment/Plan: 1. Renal- basically progressive renal failure likely secondary to DM vs hep C- now uremic and requiring initiation of HD-    PC and first stage BVT placed 10/3 followed by first HD treatment, second treatment 10/4, third 10.5- CLIP for OP dialysis arrangements- has spot at [facility] MWF and could start there Monday.   Will still do Sat- tomorrow.   I have tried to tell him how important it is to go and get his HD without drama because that is the only way to stay out of the hospital.  The nephrologists do not want to handle his pain needs- he says he has appt with new pain clinic in November- cont duragesic patches per primary team  2. HTN/vol- overloaded but improving - control with HD   - also still on hydralazine and lopressor- will likely be able to wean once gets established with HD- still have a ways to go 3. Anemia- hgb in the 8's  iron stores low- repleting and added ESA  4. Secondary hyperparathyroidism- phos 6.4- - added binder(phoslo)  PTH is 155- no vit D 5. Pain  control/psych- behavior is a huge problem on 40 mg valium daily .  Consider psych involvement to get him on a medication regimen- less sedating that will allow him to negotiate things more successfully than he has in the past- otherwise looking at repeated hospitalizations and failure in his future- likely being kicked out of HD unit for his behavior   Terika Pillard A    Labs: Basic Metabolic Panel:  Recent Labs Lab 03/28/16 0155 03/29/16 0505 03/30/16 0500 04/01/16 0409  NA 136 131* 134* 135  K 3.2* 3.5 3.9 4.0  CL 98* 95* 98* 95*  CO2 21* 20* 23 25  GLUCOSE 306* 338* 254* 246*  BUN 166* 180* 132* 71*  CREATININE 6.72* 6.64* 5.25* 3.59*  CALCIUM 7.6* 8.0* 8.4* 8.6*  PHOS 6.4*  --   --   --    Liver Function Tests:  Recent Labs Lab 03/26/16 1215 03/28/16 0155  AST 29  --   ALT 21  --   ALKPHOS 42  --   BILITOT 1.1  --  PROT 6.8  --   ALBUMIN 3.4* 3.1*   No results for input(s): LIPASE, AMYLASE in the last 168 hours. No results for input(s): AMMONIA in the last 168 hours. CBC:  Recent Labs Lab 03/26/16 1215  03/28/16 0155 03/28/16 1202 03/29/16 0646 03/30/16 0500 04/01/16 0409  WBC 13.9*  < > 11.0* 14.5* 16.1* 16.1* 16.0*  NEUTROABS 12.1*  --   --   --   --   --   --   HGB 8.9*  < > 8.5* 8.6* 8.6* 8.5* 9.1*  HCT 27.2*  < > 26.3* 26.4* 26.3* 26.0* 28.8*  MCV 87.5  < > 88.0 88.0 88.0 88.4 90.9  PLT 163  < > 146* 143* 150 135* 120*  < > = values in this interval not displayed. Cardiac Enzymes: No results for input(s): CKTOTAL, CKMB, CKMBINDEX, TROPONINI in the last 168 hours. CBG:  Recent Labs Lab 03/31/16 0745 03/31/16 1710 03/31/16 2113 04/01/16 0017 04/01/16 0803  GLUCAP 256* 246* 420* 370* 214*    Iron Studies:  No results for input(s): IRON, TIBC, TRANSFERRIN, FERRITIN in the last 72 hours. Studies/Results: No results found. Medications: Infusions: . sodium chloride 10 mL/hr at 03/29/16 0945  . heparin 1,700 Units/hr (03/31/16  2247)    Scheduled Medications: . calcium acetate  1,334 mg Oral TID WC  . cholecalciferol  1,000 Units Oral Daily  . darbepoetin (ARANESP) injection - DIALYSIS  100 mcg Intravenous Q Tue-HD  . diazepam  10 mg Oral Q6H  . fentaNYL  75 mcg Transdermal Q72H  . ferric gluconate (FERRLECIT/NULECIT) IV  125 mg Intravenous Daily  . hydrALAZINE  50 mg Oral TID  . insulin aspart  0-15 Units Subcutaneous TID WC  . insulin aspart  0-5 Units Subcutaneous QHS  . insulin aspart  5 Units Subcutaneous TID WC  . insulin glargine  45 Units Subcutaneous Q2200  . ipratropium-albuterol  3 mL Nebulization TID  . levothyroxine  88 mcg Oral QAC breakfast  . metoprolol  100 mg Oral BID  . multivitamin  1 tablet Oral QHS  . nicotine  14 mg Transdermal Daily  . simvastatin  20 mg Oral q1800  . sodium chloride flush  3 mL Intravenous Q12H  . warfarin  12.5 mg Oral ONCE-1800  . Warfarin - Pharmacist Dosing Inpatient   Does not apply q1800    have reviewed scheduled and prn medications.  Physical Exam: General: initially resting- then agitated demanding "shot"  Heart: tachy Lungs: dec BS at bases Abdomen: obese, soft Extremities: positive edema with bruising Dialysis Access: new PC and first stage left AVF placed 10/3   04/01/2016,9:45 AM  LOS: 6 days

## 2016-04-02 DIAGNOSIS — E1142 Type 2 diabetes mellitus with diabetic polyneuropathy: Secondary | ICD-10-CM

## 2016-04-02 DIAGNOSIS — N186 End stage renal disease: Secondary | ICD-10-CM

## 2016-04-02 DIAGNOSIS — Z992 Dependence on renal dialysis: Secondary | ICD-10-CM

## 2016-04-02 DIAGNOSIS — Z794 Long term (current) use of insulin: Secondary | ICD-10-CM

## 2016-04-02 DIAGNOSIS — I509 Heart failure, unspecified: Secondary | ICD-10-CM

## 2016-04-02 LAB — RENAL FUNCTION PANEL
ANION GAP: 12 (ref 5–15)
Albumin: 2.7 g/dL — ABNORMAL LOW (ref 3.5–5.0)
BUN: 105 mg/dL — ABNORMAL HIGH (ref 6–20)
CO2: 24 mmol/L (ref 22–32)
Calcium: 8.3 mg/dL — ABNORMAL LOW (ref 8.9–10.3)
Chloride: 98 mmol/L — ABNORMAL LOW (ref 101–111)
Creatinine, Ser: 4.69 mg/dL — ABNORMAL HIGH (ref 0.61–1.24)
GFR calc Af Amer: 14 mL/min — ABNORMAL LOW (ref >60)
GFR calc non Af Amer: 12 mL/min — ABNORMAL LOW (ref >60)
GLUCOSE: 210 mg/dL — AB (ref 65–99)
POTASSIUM: 4.5 mmol/L (ref 3.5–5.1)
Phosphorus: 3.7 mg/dL (ref 2.5–4.6)
SODIUM: 134 mmol/L — AB (ref 135–145)

## 2016-04-02 LAB — PROTIME-INR
INR: 1.67
PROTHROMBIN TIME: 19.9 s — AB (ref 11.4–15.2)

## 2016-04-02 LAB — CBC
HEMATOCRIT: 26.6 % — AB (ref 39.0–52.0)
HEMOGLOBIN: 8.4 g/dL — AB (ref 13.0–17.0)
MCH: 29.2 pg (ref 26.0–34.0)
MCHC: 31.6 g/dL (ref 30.0–36.0)
MCV: 92.4 fL (ref 78.0–100.0)
Platelets: 113 10*3/uL — ABNORMAL LOW (ref 150–400)
RBC: 2.88 MIL/uL — ABNORMAL LOW (ref 4.22–5.81)
RDW: 19.5 % — ABNORMAL HIGH (ref 11.5–15.5)
WBC: 15.4 10*3/uL — ABNORMAL HIGH (ref 4.0–10.5)

## 2016-04-02 LAB — GLUCOSE, CAPILLARY: Glucose-Capillary: 234 mg/dL — ABNORMAL HIGH (ref 65–99)

## 2016-04-02 LAB — HEPARIN LEVEL (UNFRACTIONATED)

## 2016-04-02 MED ORDER — HYDRALAZINE HCL 50 MG PO TABS: 50.0000 mg | ORAL_TABLET | Freq: Three times a day (TID) | ORAL | 0 refills | Status: AC

## 2016-04-02 MED ORDER — HEPARIN SODIUM (PORCINE) 1000 UNIT/ML DIALYSIS: 20.0000 [IU]/kg | INTRAMUSCULAR | Status: DC | PRN

## 2016-04-02 MED ORDER — RENA-VITE PO TABS: 1.0000 | ORAL_TABLET | Freq: Every day | ORAL | 0 refills | Status: AC

## 2016-04-02 MED ORDER — DARBEPOETIN ALFA 100 MCG/0.5ML IJ SOSY: 100.0000 ug | PREFILLED_SYRINGE | INTRAMUSCULAR | 0 refills | Status: AC

## 2016-04-02 MED ORDER — CALCIUM ACETATE (PHOS BINDER) 667 MG PO CAPS: 1334.0000 mg | ORAL_CAPSULE | Freq: Three times a day (TID) | ORAL | 0 refills | Status: AC

## 2016-04-02 MED ORDER — OXYCODONE HCL 15 MG PO TABS: 15.0000 mg | ORAL_TABLET | ORAL | 0 refills | Status: AC | PRN

## 2016-04-02 MED ORDER — METOPROLOL TARTRATE 100 MG PO TABS: 100.0000 mg | ORAL_TABLET | Freq: Two times a day (BID) | ORAL | 0 refills | Status: AC

## 2016-04-02 MED ORDER — WARFARIN SODIUM 10 MG PO TABS: 10.0000 mg | ORAL_TABLET | Freq: Once | ORAL | Status: DC

## 2016-04-02 NOTE — Progress Notes (Signed)
ANTICOAGULATION CONSULT NOTE - Follow Up Consult  Pharmacy Consult for Coumadin Indication: atrial fibrillation  Allergies  Allergen Reactions  . Novolog [Insulin Aspart] Other (See Comments)    UNSPECIFIED REACTION  "I'm just highly allergic"  . Hydrocodone-Acetaminophen Palpitations and Other (See Comments)    Doesn't want to take  . Iodides Other (See Comments)    Patient states he doesn't know what iodine is and doesn't think he is allergic to it despite it being listed with his allergies  . Other Hives and Other (See Comments)    Steroid creams "Corndogs"- Nausea & vomiting   . Sulfacetamide Sodium Hives and Itching  . Tylenol [Acetaminophen] Nausea And Vomiting  . Gabapentin Swelling    SWELLING REACTION UNSPECIFIED   . Oxycodone-Acetaminophen Nausea And Vomiting  . Antihistamines, Chlorpheniramine-Type Other (See Comments)    unknown  . Hydralazine Rash  . Iodinated Diagnostic Agents Other (See Comments)    Patient states he doesn't know what iodine is and doesn't think he is allergic to it despite it being listed with his allergies  . Latex Rash  . Lidocaine Itching and Other (See Comments)    Patient is uncertain of this allergy  . Pentazocine Other (See Comments)    unknown  . Pheniramine Other (See Comments)    unknown  . Sulfa Antibiotics Itching and Other (See Comments)  . Tape Rash  . Vicodin [Hydrocodone-Acetaminophen] Palpitations and Other (See Comments)    Doesn't want to take    Patient Measurements: Height: 6\' 10"  (208.3 cm) Weight: (!) 302 lb 14.6 oz (137.4 kg) IBW/kg (Calculated) : 100.6  Vital Signs: Temp: 98.3 F (36.8 C) (10/07 0700) Temp Source: Oral (10/07 0700) BP: 131/63 (10/07 1000) Pulse Rate: 88 (10/07 1000)  Labs:  Recent Labs  03/31/16 0348 03/31/16 1940 04/01/16 0409 04/02/16 0402  HGB  --   --  9.1* 8.4*  HCT  --   --  28.8* 26.6*  PLT  --   --  120* 113*  LABPROT 15.4*  --  16.7* 19.9*  INR 1.22  --  1.34 1.67   HEPARINUNFRC  --  0.59 0.40 <0.10*  CREATININE  --   --  3.59* 4.69*    Estimated Creatinine Clearance: 27.3 mL/min (by C-G formula based on SCr of 4.69 mg/dL (H)).  Assessment: 60yom on coumadin pta for hx afib. INR 6.94 on admit and reversed with a total 12.5mg  vitamin k for HD cathether placement and AV fistula creation. INR down to 1.25 on 10/4 and coumadin resumed along with a heparin bridge. Heparin d/c'ed 10/6. INR 1.67 today and trending up with higher 12.5mg  doses of coumadin. Hgb/Plts down a little today. No bleeding per RN. Levaquin course completed 9/30-10/4.  Home dose: 10mg  daily except 7.5mg  on Sunday and Thursday  Goal of Therapy:  INR 2-3 Monitor platelets by anticoagulation protocol: Yes   Plan:  1) Coumadin 10mg  x 1 2) Daily INR, CBC  Deboraha Sprang 04/02/2016,10:25 AM

## 2016-04-02 NOTE — Progress Notes (Signed)
Subjective: Patient seen and examined this morning while on dialysis.  He had some complaints regarding his pain, but is agreeable to resuming his home oxycodone 15mg  for pain control as symptoms of nausea/vomiting may have been more due to his worsening renal disease than medication side effect since he tolerated this medication well for a long period of time.  Objective:  Vital signs in last 24 hours: Vitals:   04/02/16 1030 04/02/16 1100 04/02/16 1112 04/02/16 1240  BP: 138/75 139/85 (!) 142/78 136/69  Pulse: 94 94 91 (!) 113  Resp:   18 17  Temp:   97.8 F (36.6 C) 98.5 F (36.9 C)  TempSrc:   Oral Oral  SpO2:   100% 99%  Weight:   292 lb 12.3 oz (132.8 kg)   Height:       Physical Exam Constitutional: NAD, appears comfortable, on dialysis Pulmonary/Chest:normal respiratory effort Neurological:A&Ox3, CN II - XII grossly intact.  Psychiatric:Normal mood and affect  Assessment/Plan:  Hypervolemia:In the setting of chronic CHF and progression of his CKD. Patient had right IJ TDC and left upper extremity AV fistula placed (03/29/16) per vascular. Now s/p 4 HD sessions (10/3, 10/4, 10/5, 10/7). Breathing and lower extremity swelling has improved.  -- Cards and nephrology following; appreciate recs -- Repeat HD today, discharge home afterwards -- Has outpatient dialysis center arranged -- F/u Vascular in 4-6 weeks for LUE duplex   Acute on Chronic CHF:  Repeat ECHO this admission with mild systolic dysfunction, EF [id number]. Dialysis per nephrology for volume management as above. -- Continue metoprolol 100 mg BID  -- Continue hydralazine increased to 50 TID per cards   COPD Exacerbation: Clinically improved.   -- S/p 5 day course of prednisone and levofloxacin   Chronic pain:On chronic opioids x 15 years per patient for diffuse "body pain". Prescribed fentanyl patch in June, verified with database. Prescribed oxycodone however patient reports associated nausea and  vomitting.  After discussion this morning, patient agreeable to resuming his home oxycodone as these symptoms may have been due to his renal disease than medication side effect.  He has an appointment with pain clinic arranged for next month.  He has been receiving #210 oxycodone tablets per month on the 22nd with last fill being 8/22.  It appears that the Fentanyl patch was started by his PCP to help with breakthrough pain in the setting of not tolerating his oxycodone. -- we will provide patient with a 7 day supply of his oxycodone in order to follow up with his PCP -- he was encouraged to follow up as planned with the pain clinic as scheduled -- we will not prescribe further Fentanyl patches and this would probably be better addressed if needed by his PCP -- he was agreeable to psych consult yesterday given recent life changes with starting dialysis and patient was agreeable.  They have yet to see the patient so would recommend PCP following this up as an outpatient if felt to be needed.  Anticoagulation: On warfarin for stroke prophylaxis in the setting of chronic a-fib. Supratherapeutic on admission requiring reversal for [facility] and AV fistula palcement on 10/3. Warfarin restarted per pharmacy  -- Discussed with pharmacy today.  Give 10mg  today and then resume home dose (10mg  daily except Sun and Demetrius Charity when takes 7.5mg )  DM2: On Lantus 30U daily at home.  Resumed at discharge.  Chronic Atrial Fibrillation: Rates well controlled.  -- Continue metoprolol100 mg BID  -- Warfarin as above  Anemia: Progression  of his chronic normocytic anemia, likely due to CKD. -- EPO level 5.6; ESA started per nephro  Hypothyroidism: -- Continue home synthroid  Hyperlipidemia: -- Continue home statin   Anxiety: -- Continue home diazepam 10 mg Q6h   FEN: Fluid restrict, replete lytes prn, renal diet VTE ppx: warfarin Code Status: FULL   Dispo: Discharge home today.   Jule Ser, DO 04/02/2016,  1:47 PM Pager: [phone]

## 2016-04-02 NOTE — Progress Notes (Signed)
Patient was sitting in the wheelchair on waiting area in front of the nursing station while waiting for his discharged papers.One of the nurse approached him and asked him to transfer to another regular chair.Patient complied,while the patient was trying to sit on a regular chair ,he missed the chair and loss his balance and landed on the floor on his butts.Patient was able to got-up himself without any assistance,refused to be assessed,he become more agitated,able to ambulate himself going to the elevator without any assistance,telling to the staffs that he would not wait for his discharged papers.As per nurse observation,patient doesn't have any skin tear nor bruised  nor any kind of injury as a result of fall becouse ,patient was able to ambulate going back and fort to the elevator on his normal walking gait without any assistance.Patient become verbally abusive as this time.Paged security for the second time.Once securities arrived,patient become easily directed.Refused to count his home 62 tablets of 10 mg Valium medicines and took his home one tablet 5 mg of oxycodone without any water in front of  the securities people.Patient signed his discharged papers and his home made receipts from the pharmacy but doesn't want to listen on his discharged instructions.Requested security people to bring the patient down as the patient is reeling/eager to get out from here since this morning.M.D made aware of the situation by talking to him post paging.No orders received.

## 2016-04-02 NOTE — Progress Notes (Signed)
Subjective:  On dialysis, no c/o's, "I'm going home".    Objective Vital signs in last 24 hours: Vitals:   04/02/16 1000 04/02/16 1030 04/02/16 1100 04/02/16 1112  BP: 131/63 138/75 139/85 (!) 142/78  Pulse: 88 94 94 91  Resp:    18  Temp:    97.8 F (36.6 C)  TempSrc:    Oral  SpO2:    100%  Weight:    132.8 kg (292 lb 12.3 oz)  Height:       Weight change: -32.5 kg (-71 lb 10.4 oz)  Intake/Output Summary (Last 24 hours) at 04/02/16 1157 Last data filed at 04/02/16 1112  Gross per 24 hour  Intake             1020 ml  Output             4950 ml  Net            -3930 ml   [employer]  Assessment/Plan: 1. 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 TDC and first stage BVT placed 10/3.  CLIP's to [facility] MWF. Will start Monday. The nephrologists will not handle his pain needs- he says he has appt with new pain clinic in November- cont duragesic patches per primary team.  2. HTN - good BP's on hydral/ metop 3. Anemia- hgb in the 8's  iron stores low- repleting and added ESA  4. Secondary hyperparathyroidism- phos 6.4- - added binder(phoslo)  PTH is 155- no vit D 5. Vol overload - better, still with LE edema , will need dry wt lowering after DC at OP unit. DC at 131kg dry wt   Kelly Splinter MD [place] pager [phone]   04/02/2016, 12:15 PM     Labs: Basic Metabolic Panel:  Recent Labs Lab 03/28/16 0155  03/30/16 0500 04/01/16 0409 04/02/16 0402  NA 136  < > 134* 135 134*  K 3.2*  < > 3.9 4.0 4.5  CL 98*  < > 98* 95* 98*  CO2 21*  < > 23 25 24   GLUCOSE 306*  < > 254* 246* 210*  BUN 166*  < > 132* 71* 105*  CREATININE 6.72*  < > 5.25* 3.59* 4.69*  CALCIUM 7.6*  < > 8.4* 8.6* 8.3*  PHOS 6.4*  --   --   --  3.7  < > = values in this interval not displayed. Liver Function Tests:  Recent Labs Lab 03/26/16 1215 03/28/16 0155 04/02/16 0402  AST 29  --   --   ALT 21  --   --   ALKPHOS 42  --   --   BILITOT 1.1  --   --   PROT  6.8  --   --   ALBUMIN 3.4* 3.1* 2.7*   No results for input(s): LIPASE, AMYLASE in the last 168 hours. No results for input(s): AMMONIA in the last 168 hours. CBC:  Recent Labs Lab 03/26/16 1215  03/28/16 1202 03/29/16 0646 03/30/16 0500 04/01/16 0409 04/02/16 0402  WBC 13.9*  < > 14.5* 16.1* 16.1* 16.0* 15.4*  NEUTROABS 12.1*  --   --   --   --   --   --   HGB 8.9*  < > 8.6* 8.6* 8.5* 9.1* 8.4*  HCT 27.2*  < > 26.4* 26.3* 26.0* 28.8* 26.6*  MCV 87.5  < > 88.0 88.0 88.4 90.9 92.4  PLT 163  < > 143* 150 135* 120* 113*  < > =  values in this interval not displayed. Cardiac Enzymes: No results for input(s): CKTOTAL, CKMB, CKMBINDEX, TROPONINI in the last 168 hours. CBG:  Recent Labs Lab 04/01/16 0017 04/01/16 0803 04/01/16 1153 04/01/16 1720 04/01/16 2136  GLUCAP 370* 214* 239* 292* 214*    Iron Studies:  No results for input(s): IRON, TIBC, TRANSFERRIN, FERRITIN in the last 72 hours. Studies/Results: No results found. Medications: Infusions: . sodium chloride 10 mL/hr at 03/29/16 0945    Scheduled Medications: . calcium acetate  1,334 mg Oral TID WC  . cholecalciferol  1,000 Units Oral Daily  . darbepoetin (ARANESP) injection - DIALYSIS  100 mcg Intravenous Q Tue-HD  . diazepam  10 mg Oral Q6H  . fentaNYL  75 mcg Transdermal Q72H  . hydrALAZINE  50 mg Oral TID  . insulin aspart  0-15 Units Subcutaneous TID WC  . insulin aspart  0-5 Units Subcutaneous QHS  . insulin aspart  5 Units Subcutaneous TID WC  . insulin glargine  45 Units Subcutaneous Q2200  . ipratropium-albuterol  3 mL Nebulization TID  . levothyroxine  88 mcg Oral QAC breakfast  . metoprolol  100 mg Oral BID  . multivitamin  1 tablet Oral QHS  . nicotine  14 mg Transdermal Daily  . simvastatin  20 mg Oral q1800  . sodium chloride flush  3 mL Intravenous Q12H  . warfarin  10 mg Oral ONCE-1800  . Warfarin - Pharmacist Dosing Inpatient   Does not apply q1800    have reviewed scheduled and prn  medications.  Physical Exam: General: initially resting- then agitated demanding "shot"  Heart: tachy Lungs: dec BS at bases Abdomen: obese, soft Extremities: positive edema with bruising Dialysis Access: new PC and first stage left AVF placed 10/3   04/02/2016,11:57 AM  LOS: 7 days

## 2016-04-02 NOTE — Discharge Instructions (Signed)
° ° °  03/31/2016 PHENIX SUNSHINE [id number] [birth year]  Surgeon(s): Waynetta Sandy, MD  Procedure(s): INSERTION OF RIGHT Internal Jugular DIALYSIS CATHETER LEFT FIRST STAGE BASILIC VEIN TRANSPOSITION  x Do not stick fistula for 12 weeks   You have been given a 7 day supply of your opiate pain medications with OXYCODONE 15mg .  This will

## 2016-04-02 NOTE — Progress Notes (Signed)
Patient back from dialysis,once he got all his medicines and including his pain medicines,patient become too demanding to have his discharged papers fixed.Explained to the patient that needs to notify his doctor that his hemodialysis is done to fixed his papers.Patient doesn't believed and become agitated and and walked out the hallway and tried to get out from here without his discharged papers.Clinical case manager tried to talked it out and explained to patient but to no avail patient was not listening and become more agitated.Paged security.Patient become compliant after securities arrived.Due to being high fall risk of him,nurse requested him to sit in the chair at the waiting area in front at the nursing station while waiting for the M.D. to complete his discharged papers.Patient choose to sit in the wheelchair while waiting for his discharged papers.

## 2016-04-03 NOTE — Progress Notes (Signed)
Calls were attempted for the contact person of this patient the was busy and  the voicemail box has not been set-up.

## 2016-04-14 ENCOUNTER — Emergency Department (HOSPITAL_COMMUNITY): Payer: Medicaid Other

## 2016-04-14 ENCOUNTER — Emergency Department (HOSPITAL_COMMUNITY)
Admission: EM | Admit: 2016-04-14 | Discharge: 2016-04-14 | Disposition: A | Discharge status: Home / Self Care | Payer: Medicaid Other | Attending: Emergency Medicine | Admitting: Emergency Medicine

## 2016-04-14 ENCOUNTER — Encounter (HOSPITAL_COMMUNITY): Payer: Self-pay

## 2016-04-14 DIAGNOSIS — I509 Heart failure, unspecified: Secondary | ICD-10-CM | POA: Insufficient documentation

## 2016-04-14 DIAGNOSIS — J449 Chronic obstructive pulmonary disease, unspecified: Secondary | ICD-10-CM | POA: Insufficient documentation

## 2016-04-14 DIAGNOSIS — E119 Type 2 diabetes mellitus without complications: Secondary | ICD-10-CM | POA: Diagnosis not present

## 2016-04-14 DIAGNOSIS — I132 Hypertensive heart and chronic kidney disease with heart failure and with stage 5 chronic kidney disease, or end stage renal disease: Secondary | ICD-10-CM | POA: Insufficient documentation

## 2016-04-14 DIAGNOSIS — Z794 Long term (current) use of insulin: Secondary | ICD-10-CM | POA: Insufficient documentation

## 2016-04-14 DIAGNOSIS — Z7901 Long term (current) use of anticoagulants: Secondary | ICD-10-CM | POA: Insufficient documentation

## 2016-04-14 DIAGNOSIS — T148XXA Other injury of unspecified body region, initial encounter: Secondary | ICD-10-CM

## 2016-04-14 DIAGNOSIS — Z91040 Latex allergy status: Secondary | ICD-10-CM | POA: Diagnosis not present

## 2016-04-14 DIAGNOSIS — F17210 Nicotine dependence, cigarettes, uncomplicated: Secondary | ICD-10-CM | POA: Diagnosis not present

## 2016-04-14 DIAGNOSIS — D689 Coagulation defect, unspecified: Secondary | ICD-10-CM | POA: Insufficient documentation

## 2016-04-14 DIAGNOSIS — N186 End stage renal disease: Secondary | ICD-10-CM | POA: Insufficient documentation

## 2016-04-14 DIAGNOSIS — N189 Chronic kidney disease, unspecified: Secondary | ICD-10-CM

## 2016-04-14 DIAGNOSIS — E039 Hypothyroidism, unspecified: Secondary | ICD-10-CM | POA: Diagnosis not present

## 2016-04-14 DIAGNOSIS — R0602 Shortness of breath: Secondary | ICD-10-CM | POA: Diagnosis present

## 2016-04-14 LAB — BASIC METABOLIC PANEL
Anion gap: 13 (ref 5–15)
BUN: 73 mg/dL — ABNORMAL HIGH (ref 6–20)
CO2: 24 mmol/L (ref 22–32)
Calcium: 8 mg/dL — ABNORMAL LOW (ref 8.9–10.3)
Chloride: 95 mmol/L — ABNORMAL LOW (ref 101–111)
Creatinine, Ser: 5.3 mg/dL — ABNORMAL HIGH (ref 0.61–1.24)
GFR calc Af Amer: 12 mL/min — ABNORMAL LOW (ref >60)
GFR calc non Af Amer: 11 mL/min — ABNORMAL LOW (ref >60)
Glucose, Bld: 208 mg/dL — ABNORMAL HIGH (ref 65–99)
Potassium: 3.6 mmol/L (ref 3.5–5.1)
Sodium: 132 mmol/L — ABNORMAL LOW (ref 135–145)

## 2016-04-14 LAB — CBC WITH DIFFERENTIAL/PLATELET
BASOS PCT: 0 %
Basophils Absolute: 0 10*3/uL (ref 0.0–0.1)
EOS ABS: 0 10*3/uL (ref 0.0–0.7)
EOS PCT: 0 %
HCT: 26.3 % — ABNORMAL LOW (ref 39.0–52.0)
HEMOGLOBIN: 8.7 g/dL — AB (ref 13.0–17.0)
LYMPHS ABS: 0.6 10*3/uL — AB (ref 0.7–4.0)
Lymphocytes Relative: 6 %
MCH: 29.3 pg (ref 26.0–34.0)
MCHC: 33.1 g/dL (ref 30.0–36.0)
MCV: 88.6 fL (ref 78.0–100.0)
MONOS PCT: 6 %
Monocytes Absolute: 0.6 10*3/uL (ref 0.1–1.0)
NEUTROS PCT: 88 %
Neutro Abs: 9.2 10*3/uL — ABNORMAL HIGH (ref 1.7–7.7)
PLATELETS: 88 10*3/uL — AB (ref 150–400)
RBC: 2.97 MIL/uL — ABNORMAL LOW (ref 4.22–5.81)
RDW: 18.7 % — AB (ref 11.5–15.5)
WBC: 10.4 10*3/uL (ref 4.0–10.5)

## 2016-04-14 LAB — PROTIME-INR
INR: 5.93
Prothrombin Time: 54.8 seconds — ABNORMAL HIGH (ref 11.4–15.2)

## 2016-04-14 MED ORDER — OXYCODONE HCL 5 MG PO TABS
10.0000 mg | ORAL_TABLET | Freq: Once | ORAL | Status: AC
  Administered 2016-04-14: 10 mg via ORAL
  Filled 2016-04-14: qty 2

## 2016-04-14 NOTE — ED Notes (Signed)
Family at bedside. 

## 2016-04-14 NOTE — ED Notes (Signed)
Critical lab INR 5.93

## 2016-04-14 NOTE — ED Provider Notes (Signed)
[facility] DEPT Provider Note   CSN: [id number] Arrival date & time: 04/14/16  [id number]  By signing my name below, I, Reola Mosher, attest that this documentation has been prepared under the direction and in the presence of Ripley Fraise, MD. Electronically Signed: Reola Mosher, ED Scribe. 04/14/16. 3:30 AM.  History   Chief Complaint Chief Complaint  Patient presents with  . Shortness of Breath   The history is provided by the patient and the EMS personnel. No language interpreter was used.  Shortness of Breath  This is a new problem. The current episode started 1 to 2 hours ago. The problem has been gradually improving. Associated symptoms include cough. Pertinent negatives include no fever, no chest pain, no vomiting and no abdominal pain. Treatments tried: Atrovent and Albuterol. The treatment provided moderate relief. He has had prior hospitalizations. He has had prior ED visits. Associated medical issues include COPD and heart failure.   HPI Comments: Johnny Sims is a 60 y.o. male BIB EMS, with a PMHx of COPD, CKD stage II, and CHF, who presents to the Emergency Department complaining of gradual onset, constant shortness of breath onset PTA. Pt reports associated dry cough and diffuse, cramping myalgias secondary to his episode of shortness of breath. EMS gave the pt 5mg  Albuterol and 0.5 Atrovent with moderate relief of his SOB. Pt's last full dialysis session was yesterday which he tolerated well. Denies fever, vomiting, nausea, chest pain, abdominal pain, weakness, dizziness, or any other associated symptoms.   Pt is additionally c/o a wound sustained just below the right knee onset just PTA. Bleeding is controlled with pressure dressing. Pt denies any known trauma or injury to the leg to sustain his wound. Pt states that her wrapped the area of bleeding prior to coming into the ED; however no other treatments were tried. He is not currntly followed by a wound  specialist for his h/o lower extremity edema.    Past Medical History:  Diagnosis Date  . Anxiety   . Asthma   . Bipolar 1 disorder ([place])   . Chronic pain   . CKD (chronic kidney disease), stage II   . COPD (chronic obstructive pulmonary disease) ([place])   . Diabetes mellitus   . Fibromyalgia   . GERD (gastroesophageal reflux disease)   . Hepatitis C   . Hepatitis C   . Hyperlipidemia   . Hypertension   . Hypothyroidism   . Melanoma ([place])    face, shoulder arm  . OA (osteoarthritis)   . PAF (paroxysmal atrial fibrillation) ([place])   . Pancreatitis, acute 2015  . RA (rheumatoid arthritis) ([place])   . Venous stasis    Patient Active Problem List   Diagnosis Date Noted  . Congestive heart failure ([place])   . ESRD on dialysis ([place])   . Hypokalemia 03/26/2016  . Encounter for therapeutic drug monitoring 09/07/2015  . Pyrexia   . Upper respiratory tract infection   . Type 2 diabetes mellitus with diabetic polyneuropathy, with long-term current use of insulin ([place])   . Thyroid activity decreased   . Atrial fibrillation status post cardioversion ([place]) 02/03/2015  . GERD (gastroesophageal reflux disease) 11/10/2014  . Tobacco use disorder 08/28/2014  . Hyperlipidemia 07/16/2012  . Essential hypertension 07/16/2012  . COPD exacerbation ([place]) 07/16/2012   Past Surgical History:  Procedure Laterality Date  . AMPUTATION Left 11/11/2014   Procedure: FOOT, SECOND [place] AMPUTATION;  Surgeon: Newt Minion, MD;  Location: [facility];  Service: Orthopedics;  Laterality: Left;  . BASCILIC VEIN TRANSPOSITION Left 03/29/2016   Procedure: LEFT FIRST STAGE BASILIC VEIN TRANSPOSITION;  Surgeon: Waynetta Sandy, MD;  Location: [facility];  Service: Vascular;  Laterality: Left;  . CARDIOVERSION  02/03/2015  . CARDIOVERSION N/A 02/03/2015   Procedure: CARDIOVERSION;  Surgeon: Thayer Headings, MD;  Location: [facility];  Service: Cardiovascular;  Laterality: N/A;  . FRACTURE SURGERY Left    arm  .  HERNIA REPAIR     umbicial  . INGUINAL HERNIA REPAIR Bilateral   . INSERTION OF DIALYSIS CATHETER Right 03/29/2016   Procedure: INSERTION OF RIGHT Internal Jugular DIALYSIS CATHETER;  Surgeon: Waynetta Sandy, MD;  Location: [facility];  Service: Vascular;  Laterality: Right;  . KNEE SURGERY     /H&P 02/20/2010  . SKIN CANCER EXCISION     melonoma  . SUBMANDIBULAR GLAND EXCISION Left 12/25/2013   w/sialolithotomy  . SUBMANDIBULAR GLAND EXCISION Left 12/25/2013   Procedure: SUBMANDIBULAR GLAND STONE AND CHRONIC SIALOLITHIASIS EXCISION;  Surgeon: Melida Quitter, MD;  Location: [facility];  Service: ENT;  Laterality: Left;  . THYROIDECTOMY    . TONSILLECTOMY    . US ECHOCARDIOGRAPHY  02/19/2010   EF 55-60%    Home Medications    Prior to Admission medications   Medication Sig Start Date End Date Taking? Authorizing Provider  albuterol (PROVENTIL HFA;VENTOLIN HFA) 108 (90 BASE) MCG/ACT inhaler Inhale 2 puffs into the lungs every 4 (four) hours as needed for wheezing or shortness of breath.    Historical Provider, MD  albuterol (PROVENTIL) (2.5 MG/3ML) 0.083% nebulizer solution Take 2.5 mg by nebulization 4 (four) times daily.    Historical Provider, MD  calcium acetate (PHOSLO) 667 MG capsule Take 2 capsules (1,334 mg total) by mouth 3 (three) times daily with meals. 04/02/16   Jule Ser, DO  cholecalciferol (VITAMIN D) 1000 units tablet Take 1,000 Units by mouth daily.    Historical Provider, MD  COUMADIN 10 MG tablet Take as directed by the Coumadin clinic. Patient taking differently: Take 10 mg by mouth See admin instructions. Mon/Tues/Wed/Fri/Sat (in the evening) 01/19/16   Thayer Headings, MD  COUMADIN 7.5 MG tablet Take as directed by Coumadin clinic. Patient taking differently: Take 7.5 mg by mouth. Sun/Thurs (in the evening) 01/19/16   Thayer Headings, MD  Darbepoetin Alfa (ARANESP) 100 MCG/0.5ML SOSY injection Inject 0.5 mLs (100 mcg total) into the vein every Tuesday with hemodialysis.  04/05/16   Jule Ser, DO  diazepam (VALIUM) 10 MG tablet Take 10 mg by mouth every 4 (four) hours as needed for anxiety.  08/24/15   Historical Provider, MD  hydrALAZINE (APRESOLINE) 50 MG tablet Take 1 tablet (50 mg total) by mouth 3 (three) times daily. 04/02/16   Jule Ser, DO  Insulin Glargine (LANTUS SOLOSTAR) 100 UNIT/ML Solostar Pen Inject 30 Units into the skin daily at 10 pm. 06/25/15   Loleta Chance, MD  levothyroxine (SYNTHROID) 88 MCG tablet Take 1 tablet (88 mcg total) by mouth daily before breakfast. Will send additional refills based off blood level Patient taking differently: Take 88 mcg by mouth daily before breakfast.  03/06/15   Lance Bosch, NP  metoprolol (LOPRESSOR) 100 MG tablet Take 1 tablet (100 mg total) by mouth 2 (two) times daily. 04/02/16   Jule Ser, DO  multivitamin (RENA-VIT) TABS tablet Take 1 tablet by mouth at bedtime. 04/02/16   Jule Ser, DO  nicotine (NICODERM CQ - DOSED IN MG/24 HOURS) 14 mg/24hr patch Place 14 mg  onto the skin daily.    Historical Provider, MD  omeprazole (PRILOSEC) 20 MG capsule Take 1 capsule (20 mg total) by mouth daily. Take with [place] at the same time under FASTING condition Patient taking differently: Take 20 mg by mouth daily.  03/06/15   Lance Bosch, NP  oxyCODONE (ROXICODONE) 15 MG immediate release tablet Take 1 tablet (15 mg total) by mouth every 3 (three) hours as needed for pain. 04/02/16   Ledell Noss, MD  psyllium (HYDROCIL/METAMUCIL) 95 % PACK Take 1 packet by mouth daily. Patient not taking: Reported on 03/26/2016 06/25/15   Loleta Chance, MD  simvastatin (ZOCOR) 20 MG tablet Take 1 tablet (20 mg total) by mouth daily at 6 PM. Patient taking differently: Take 10 mg by mouth daily at 6 PM.  03/06/15   Lance Bosch, NP  SPIRIVA HANDIHALER 18 MCG inhalation capsule INHALE CONTENTS OF ONE CAPSULE ONCE A DAY 04/01/15   Thayer Headings, MD  SURE COMFORT PEN NEEDLES 31G X 8 MM MISC USE ONE NIGHTLY 09/07/15   Lance Bosch,  NP   Family History Family History  Problem Relation Age of Onset  . Hypertension Mother   . Alzheimer's disease Mother   . Heart disease Father    Social History Social History  Substance Use Topics  . Smoking status: Current Every Day Smoker    Packs/day: 1.00    Years: 43.00    Types: Cigarettes  . Smokeless tobacco: Never Used  . Alcohol use Yes     Comment: rare   Allergies   Novolog [insulin aspart]; Hydrocodone-acetaminophen; Iodides; Other; Sulfacetamide sodium; Tylenol [acetaminophen]; Gabapentin; Oxycodone-acetaminophen; Antihistamines, chlorpheniramine-type; Hydralazine; Iodinated diagnostic agents; Latex; Lidocaine; Pentazocine; Pheniramine; Sulfa antibiotics; Tape; and Vicodin [hydrocodone-acetaminophen]  Review of Systems Review of Systems  Constitutional: Negative for fever.  Respiratory: Positive for cough and shortness of breath.   Cardiovascular: Negative for chest pain.  Gastrointestinal: Negative for abdominal pain, nausea and vomiting.  Musculoskeletal: Positive for myalgias (diffuse).  Skin: Positive for wound.  Neurological: Negative for dizziness.  All other systems reviewed and are negative.  Physical Exam Updated Vital Signs BP 136/86   Pulse 89   Temp 97.7 F (36.5 C) (Oral)   Resp 12   Ht 6\' 10"  (2.083 m)   SpO2 98%   Physical Exam  CONSTITUTIONAL: Chronically ill appearing  HEAD: Normocephalic/atraumatic EYES: EOMI ENMT: Mucous membranes moist NECK: supple no meningeal signs SPINE/BACK:entire spine nontender CV: Irregular, no loud murmurs  LUNGS: Scattered wheezing bilaterally, no distress noted  ABDOMEN: soft, nontender, no rebound or guarding, bowel sounds noted throughout abdomen GU:no cva tenderness NEURO: Pt is awake/alert/appropriate, moves all extremitiesx4.  No facial droop.   EXTREMITIES: pulses normal/equal, full ROM; chronic pitting edema to bilateral lower extremities. Right leg is weeping fluid. No crepitus. No active  bleeding noted. Distal pulses intact. No crepitus SKIN: warm, color normal; dialysis catheter to right chest PSYCH: no abnormalities of mood noted, alert and oriented to situation  ED Treatments / Results  DIAGNOSTIC STUDIES: Oxygen Saturation is 98% on RA, normal by my interpretation.   COORDINATION OF CARE: 3:27 AM-Discussed next steps with pt. Pt verbalized understanding and is agreeable with the plan.   Labs (all labs ordered are listed, but only abnormal results are displayed) Labs Reviewed  BASIC METABOLIC PANEL - Abnormal; Notable for the following:       Result Value   Sodium 132 (*)    Chloride 95 (*)    Glucose, Bld  208 (*)    BUN 73 (*)    Creatinine, Ser 5.30 (*)    Calcium 8.0 (*)    GFR calc non Af Amer 11 (*)    GFR calc Af Amer 12 (*)    All other components within normal limits  CBC WITH DIFFERENTIAL/PLATELET - Abnormal; Notable for the following:    RBC 2.97 (*)    Hemoglobin 8.7 (*)    HCT 26.3 (*)    RDW 18.7 (*)    Platelets 88 (*)    Neutro Abs 9.2 (*)    Lymphs Abs 0.6 (*)    All other components within normal limits  PROTIME-INR - Abnormal; Notable for the following:    Prothrombin Time 54.8 (*)    INR 5.93 (*)    All other components within normal limits    EKG  EKG Interpretation  Date/Time:  Thursday April 14 2016 01:58:16 EDT Ventricular Rate:  94 PR Interval:    QRS Duration: 105 QT Interval:  359 QTC Calculation: 449 R Axis:   53 Text Interpretation:  Atrial fibrillation Borderline repolarization abnormality Interpretation limited secondary to artifact Confirmed by Christy Gentles  MD, [place] ([id number]) on 04/14/2016 3:15:29 AM      Radiology Dg Chest Portable 1 View  Result Date: 04/14/2016 CLINICAL DATA:  Initial evaluation for acute shortness of breath. EXAM: PORTABLE CHEST 1 VIEW COMPARISON:  Prior radiograph from 03/29/2016. FINDINGS: Right-sided central venous catheter remains in place with tip overlying the distal SVC.  Cardiomegaly is stable. Mediastinal silhouette within normal limits. Lungs are normally inflated. Asymmetric vascular congestion within the left mid and lower lung as compared to the right, which may reflect asymmetric edema or possibly infiltrates. More mild vascular congestion within the right lung noted as well. No definite pleural effusion, although costophrenic angles are incompletely visualized. No pneumothorax. No acute osseous abnormality. IMPRESSION: Stable cardiomegaly with asymmetric vascular congestion and patchy opacities within the left lung as compared to the right. While these findings may reflect asymmetric pulmonary edema, possible superimposed infectious infiltrate within the left mid and lower lung could be considered in the correct clinical setting. Electronically Signed   By: Jeannine Boga M.D.   On: 04/14/2016 03:59    Procedures Procedures   Medications Ordered in ED Medications  oxyCODONE (Oxy IR/ROXICODONE) immediate release tablet 10 mg (10 mg Oral Given 04/14/16 0426)    Initial Impression / Assessment and Plan / ED Course  I have reviewed the triage vital signs and the nursing notes.  Pertinent labs & imaging results that were available during my care of the patient were reviewed by me and considered in my medical decision making (see chart for details).  Clinical Course   Pt stable He is awake/alert, walking around in no distress Bleeding controlled I suspect he is near baseline I did adjust his coumadin dosing per pharmacy recs- he is told coumadin for 3 days, restart it and get INR check He is going to dialysis today Currently, I feel he is appropriate for d/c home and he is requesting d/c home   Final Clinical Impressions(s) / ED Diagnoses   Final diagnoses:  Coagulopathy ([place])  Chronic kidney disease, unspecified CKD stage  Bleeding from wound   New Prescriptions New Prescriptions   No medications on file   I personally performed the  services described in this documentation, which was scribed in my presence. The recorded information has been reviewed and is accurate.       Ripley Fraise, MD  04/14/16 0601  

## 2016-04-14 NOTE — ED Triage Notes (Signed)
Pt arrived via [facility] from home c/o SOB EMS gave 5mg  albuterol 0.5mg  Atrovent pt denies any SOB on arrival.  Right bottom leg weeping and controlled bleeding

## 2016-04-14 NOTE — ED Notes (Signed)
Pt removed monitoring equipment. Pt informed of the importance of leaving equipment in place for pt safety. Pt states "I had to go to the bathroom."

## 2016-04-14 NOTE — Discharge Instructions (Signed)
HOLD COUMADIN FOR 3 DAYS, THEN RESTART AND HAVE YOUR COUMADIN LEVEL RECHECKED ON 10/23

## 2016-04-14 NOTE — ED Notes (Signed)
Patient was yelling and refusing to stay any longer.  After his discharge papers and after his IV was removed he attempted to take gauze from the cabinet where he hit the site again began bleeding.  There was blood inside the cabinet on supplies inside the drawer.  Also a trail of blood that lead to Room A5 bathroom.  He had a pile of supplies that he had taken from the drawer that he said he needed and packed them in with his personal belongings.

## 2016-04-16 ENCOUNTER — Emergency Department (HOSPITAL_COMMUNITY): Payer: Medicaid Other

## 2016-04-16 ENCOUNTER — Encounter (HOSPITAL_COMMUNITY): Payer: Self-pay | Admitting: Emergency Medicine

## 2016-04-16 ENCOUNTER — Observation Stay (HOSPITAL_COMMUNITY): Payer: Medicaid Other

## 2016-04-16 ENCOUNTER — Inpatient Hospital Stay (HOSPITAL_COMMUNITY)
Admission: EM | Admit: 2016-04-16 | Discharge: 2016-05-27 | DRG: 314 | Disposition: E | Payer: Medicaid Other | Attending: Pulmonary Disease | Admitting: Pulmonary Disease

## 2016-04-16 DIAGNOSIS — E1122 Type 2 diabetes mellitus with diabetic chronic kidney disease: Secondary | ICD-10-CM | POA: Diagnosis present

## 2016-04-16 DIAGNOSIS — R296 Repeated falls: Secondary | ICD-10-CM | POA: Diagnosis present

## 2016-04-16 DIAGNOSIS — W19XXXA Unspecified fall, initial encounter: Secondary | ICD-10-CM | POA: Diagnosis present

## 2016-04-16 DIAGNOSIS — Z884 Allergy status to anesthetic agent status: Secondary | ICD-10-CM

## 2016-04-16 DIAGNOSIS — I248 Other forms of acute ischemic heart disease: Secondary | ICD-10-CM | POA: Diagnosis present

## 2016-04-16 DIAGNOSIS — R4182 Altered mental status, unspecified: Secondary | ICD-10-CM | POA: Diagnosis not present

## 2016-04-16 DIAGNOSIS — E1152 Type 2 diabetes mellitus with diabetic peripheral angiopathy with gangrene: Secondary | ICD-10-CM | POA: Diagnosis present

## 2016-04-16 DIAGNOSIS — I132 Hypertensive heart and chronic kidney disease with heart failure and with stage 5 chronic kidney disease, or end stage renal disease: Secondary | ICD-10-CM | POA: Diagnosis present

## 2016-04-16 DIAGNOSIS — D696 Thrombocytopenia, unspecified: Secondary | ICD-10-CM | POA: Diagnosis present

## 2016-04-16 DIAGNOSIS — R05 Cough: Secondary | ICD-10-CM | POA: Diagnosis present

## 2016-04-16 DIAGNOSIS — N186 End stage renal disease: Secondary | ICD-10-CM | POA: Diagnosis present

## 2016-04-16 DIAGNOSIS — E890 Postprocedural hypothyroidism: Secondary | ICD-10-CM | POA: Diagnosis present

## 2016-04-16 DIAGNOSIS — Z781 Physical restraint status: Secondary | ICD-10-CM

## 2016-04-16 DIAGNOSIS — Z882 Allergy status to sulfonamides status: Secondary | ICD-10-CM

## 2016-04-16 DIAGNOSIS — J449 Chronic obstructive pulmonary disease, unspecified: Secondary | ICD-10-CM | POA: Diagnosis present

## 2016-04-16 DIAGNOSIS — A419 Sepsis, unspecified organism: Secondary | ICD-10-CM

## 2016-04-16 DIAGNOSIS — T827XXA Infection and inflammatory reaction due to other cardiac and vascular devices, implants and grafts, initial encounter: Secondary | ICD-10-CM | POA: Diagnosis present

## 2016-04-16 DIAGNOSIS — S0083XA Contusion of other part of head, initial encounter: Secondary | ICD-10-CM | POA: Diagnosis present

## 2016-04-16 DIAGNOSIS — A4150 Gram-negative sepsis, unspecified: Secondary | ICD-10-CM | POA: Diagnosis present

## 2016-04-16 DIAGNOSIS — Z794 Long term (current) use of insulin: Secondary | ICD-10-CM

## 2016-04-16 DIAGNOSIS — T45515A Adverse effect of anticoagulants, initial encounter: Secondary | ICD-10-CM | POA: Diagnosis present

## 2016-04-16 DIAGNOSIS — Z9289 Personal history of other medical treatment: Secondary | ICD-10-CM

## 2016-04-16 DIAGNOSIS — Z89422 Acquired absence of other left toe(s): Secondary | ICD-10-CM

## 2016-04-16 DIAGNOSIS — I4891 Unspecified atrial fibrillation: Secondary | ICD-10-CM | POA: Diagnosis present

## 2016-04-16 DIAGNOSIS — Z885 Allergy status to narcotic agent status: Secondary | ICD-10-CM

## 2016-04-16 DIAGNOSIS — E1165 Type 2 diabetes mellitus with hyperglycemia: Secondary | ICD-10-CM | POA: Diagnosis present

## 2016-04-16 DIAGNOSIS — D6832 Hemorrhagic disorder due to extrinsic circulating anticoagulants: Secondary | ICD-10-CM | POA: Diagnosis present

## 2016-04-16 DIAGNOSIS — Z66 Do not resuscitate: Secondary | ICD-10-CM | POA: Diagnosis present

## 2016-04-16 DIAGNOSIS — G8929 Other chronic pain: Secondary | ICD-10-CM | POA: Diagnosis present

## 2016-04-16 DIAGNOSIS — M069 Rheumatoid arthritis, unspecified: Secondary | ICD-10-CM | POA: Diagnosis present

## 2016-04-16 DIAGNOSIS — I080 Rheumatic disorders of both mitral and aortic valves: Secondary | ICD-10-CM | POA: Diagnosis present

## 2016-04-16 DIAGNOSIS — E041 Nontoxic single thyroid nodule: Secondary | ICD-10-CM | POA: Diagnosis present

## 2016-04-16 DIAGNOSIS — E872 Acidosis: Secondary | ICD-10-CM | POA: Diagnosis present

## 2016-04-16 DIAGNOSIS — I6789 Other cerebrovascular disease: Secondary | ICD-10-CM | POA: Diagnosis not present

## 2016-04-16 DIAGNOSIS — I481 Persistent atrial fibrillation: Secondary | ICD-10-CM | POA: Diagnosis present

## 2016-04-16 DIAGNOSIS — D631 Anemia in chronic kidney disease: Secondary | ICD-10-CM | POA: Diagnosis present

## 2016-04-16 DIAGNOSIS — Z85820 Personal history of malignant melanoma of skin: Secondary | ICD-10-CM

## 2016-04-16 DIAGNOSIS — E785 Hyperlipidemia, unspecified: Secondary | ICD-10-CM | POA: Diagnosis present

## 2016-04-16 DIAGNOSIS — Z79899 Other long term (current) drug therapy: Secondary | ICD-10-CM

## 2016-04-16 DIAGNOSIS — Z992 Dependence on renal dialysis: Secondary | ICD-10-CM

## 2016-04-16 DIAGNOSIS — R531 Weakness: Secondary | ICD-10-CM

## 2016-04-16 DIAGNOSIS — I63411 Cerebral infarction due to embolism of right middle cerebral artery: Secondary | ICD-10-CM | POA: Diagnosis not present

## 2016-04-16 DIAGNOSIS — R471 Dysarthria and anarthria: Secondary | ICD-10-CM | POA: Diagnosis present

## 2016-04-16 DIAGNOSIS — G9340 Encephalopathy, unspecified: Secondary | ICD-10-CM | POA: Diagnosis not present

## 2016-04-16 DIAGNOSIS — M797 Fibromyalgia: Secondary | ICD-10-CM | POA: Diagnosis present

## 2016-04-16 DIAGNOSIS — R6521 Severe sepsis with septic shock: Secondary | ICD-10-CM | POA: Diagnosis present

## 2016-04-16 DIAGNOSIS — R778 Other specified abnormalities of plasma proteins: Secondary | ICD-10-CM | POA: Diagnosis present

## 2016-04-16 DIAGNOSIS — L03116 Cellulitis of left lower limb: Secondary | ICD-10-CM | POA: Diagnosis present

## 2016-04-16 DIAGNOSIS — I482 Chronic atrial fibrillation: Secondary | ICD-10-CM | POA: Diagnosis present

## 2016-04-16 DIAGNOSIS — R791 Abnormal coagulation profile: Secondary | ICD-10-CM | POA: Diagnosis not present

## 2016-04-16 DIAGNOSIS — I5042 Chronic combined systolic (congestive) and diastolic (congestive) heart failure: Secondary | ICD-10-CM | POA: Diagnosis present

## 2016-04-16 DIAGNOSIS — F1120 Opioid dependence, uncomplicated: Secondary | ICD-10-CM | POA: Diagnosis present

## 2016-04-16 DIAGNOSIS — K219 Gastro-esophageal reflux disease without esophagitis: Secondary | ICD-10-CM | POA: Diagnosis present

## 2016-04-16 DIAGNOSIS — A4902 Methicillin resistant Staphylococcus aureus infection, unspecified site: Secondary | ICD-10-CM | POA: Diagnosis present

## 2016-04-16 DIAGNOSIS — I480 Paroxysmal atrial fibrillation: Secondary | ICD-10-CM | POA: Diagnosis present

## 2016-04-16 DIAGNOSIS — F419 Anxiety disorder, unspecified: Secondary | ICD-10-CM | POA: Diagnosis present

## 2016-04-16 DIAGNOSIS — E1142 Type 2 diabetes mellitus with diabetic polyneuropathy: Secondary | ICD-10-CM | POA: Diagnosis present

## 2016-04-16 DIAGNOSIS — G9341 Metabolic encephalopathy: Secondary | ICD-10-CM | POA: Diagnosis present

## 2016-04-16 DIAGNOSIS — R059 Cough, unspecified: Secondary | ICD-10-CM

## 2016-04-16 DIAGNOSIS — Z7901 Long term (current) use of anticoagulants: Secondary | ICD-10-CM

## 2016-04-16 DIAGNOSIS — Z978 Presence of other specified devices: Secondary | ICD-10-CM

## 2016-04-16 DIAGNOSIS — I5043 Acute on chronic combined systolic (congestive) and diastolic (congestive) heart failure: Secondary | ICD-10-CM | POA: Diagnosis not present

## 2016-04-16 DIAGNOSIS — L03115 Cellulitis of right lower limb: Secondary | ICD-10-CM | POA: Diagnosis present

## 2016-04-16 DIAGNOSIS — B1920 Unspecified viral hepatitis C without hepatic coma: Secondary | ICD-10-CM | POA: Diagnosis present

## 2016-04-16 DIAGNOSIS — Z515 Encounter for palliative care: Secondary | ICD-10-CM | POA: Diagnosis present

## 2016-04-16 DIAGNOSIS — D72819 Decreased white blood cell count, unspecified: Secondary | ICD-10-CM | POA: Diagnosis not present

## 2016-04-16 DIAGNOSIS — Z6835 Body mass index (BMI) 35.0-35.9, adult: Secondary | ICD-10-CM

## 2016-04-16 DIAGNOSIS — I10 Essential (primary) hypertension: Secondary | ICD-10-CM | POA: Diagnosis present

## 2016-04-16 DIAGNOSIS — I639 Cerebral infarction, unspecified: Secondary | ICD-10-CM | POA: Diagnosis present

## 2016-04-16 DIAGNOSIS — I878 Other specified disorders of veins: Secondary | ICD-10-CM | POA: Diagnosis present

## 2016-04-16 DIAGNOSIS — J690 Pneumonitis due to inhalation of food and vomit: Secondary | ICD-10-CM | POA: Diagnosis present

## 2016-04-16 DIAGNOSIS — I96 Gangrene, not elsewhere classified: Secondary | ICD-10-CM | POA: Diagnosis present

## 2016-04-16 DIAGNOSIS — Z91048 Other nonmedicinal substance allergy status: Secondary | ICD-10-CM

## 2016-04-16 DIAGNOSIS — E669 Obesity, unspecified: Secondary | ICD-10-CM | POA: Diagnosis present

## 2016-04-16 DIAGNOSIS — R7881 Bacteremia: Secondary | ICD-10-CM | POA: Diagnosis not present

## 2016-04-16 DIAGNOSIS — L8990 Pressure ulcer of unspecified site, unspecified stage: Secondary | ICD-10-CM | POA: Diagnosis present

## 2016-04-16 DIAGNOSIS — J9601 Acute respiratory failure with hypoxia: Secondary | ICD-10-CM

## 2016-04-16 DIAGNOSIS — Z91040 Latex allergy status: Secondary | ICD-10-CM

## 2016-04-16 DIAGNOSIS — L97529 Non-pressure chronic ulcer of other part of left foot with unspecified severity: Secondary | ICD-10-CM | POA: Diagnosis present

## 2016-04-16 DIAGNOSIS — M1990 Unspecified osteoarthritis, unspecified site: Secondary | ICD-10-CM | POA: Diagnosis present

## 2016-04-16 DIAGNOSIS — F319 Bipolar disorder, unspecified: Secondary | ICD-10-CM | POA: Diagnosis present

## 2016-04-16 DIAGNOSIS — Z452 Encounter for adjustment and management of vascular access device: Secondary | ICD-10-CM

## 2016-04-16 DIAGNOSIS — Z91041 Radiographic dye allergy status: Secondary | ICD-10-CM

## 2016-04-16 DIAGNOSIS — N2581 Secondary hyperparathyroidism of renal origin: Secondary | ICD-10-CM | POA: Diagnosis present

## 2016-04-16 DIAGNOSIS — R4781 Slurred speech: Secondary | ICD-10-CM | POA: Diagnosis not present

## 2016-04-16 DIAGNOSIS — R451 Restlessness and agitation: Secondary | ICD-10-CM | POA: Diagnosis present

## 2016-04-16 DIAGNOSIS — F17210 Nicotine dependence, cigarettes, uncomplicated: Secondary | ICD-10-CM | POA: Diagnosis present

## 2016-04-16 DIAGNOSIS — Z886 Allergy status to analgesic agent status: Secondary | ICD-10-CM

## 2016-04-16 DIAGNOSIS — I959 Hypotension, unspecified: Secondary | ICD-10-CM | POA: Diagnosis present

## 2016-04-16 DIAGNOSIS — E213 Hyperparathyroidism, unspecified: Secondary | ICD-10-CM | POA: Diagnosis present

## 2016-04-16 DIAGNOSIS — Z7189 Other specified counseling: Secondary | ICD-10-CM | POA: Diagnosis not present

## 2016-04-16 DIAGNOSIS — Z888 Allergy status to other drugs, medicaments and biological substances status: Secondary | ICD-10-CM

## 2016-04-16 LAB — RENAL FUNCTION PANEL
ANION GAP: 15 (ref 5–15)
Albumin: 2.1 g/dL — ABNORMAL LOW (ref 3.5–5.0)
BUN: 71 mg/dL — ABNORMAL HIGH (ref 6–20)
CO2: 25 mmol/L (ref 22–32)
Calcium: 8.2 mg/dL — ABNORMAL LOW (ref 8.9–10.3)
Chloride: 95 mmol/L — ABNORMAL LOW (ref 101–111)
Creatinine, Ser: 5.58 mg/dL — ABNORMAL HIGH (ref 0.61–1.24)
GFR calc non Af Amer: 10 mL/min — ABNORMAL LOW (ref >60)
GFR, EST AFRICAN AMERICAN: 12 mL/min — AB (ref >60)
GLUCOSE: 74 mg/dL (ref 65–99)
POTASSIUM: 4.2 mmol/L (ref 3.5–5.1)
Phosphorus: 3.5 mg/dL (ref 2.5–4.6)
Sodium: 135 mmol/L (ref 135–145)

## 2016-04-16 LAB — GLUCOSE, CAPILLARY
GLUCOSE-CAPILLARY: 83 mg/dL (ref 65–99)
Glucose-Capillary: 74 mg/dL (ref 65–99)
Glucose-Capillary: 79 mg/dL (ref 65–99)

## 2016-04-16 LAB — PROTIME-INR
INR: 8.72
Prothrombin Time: 74.7 seconds — ABNORMAL HIGH (ref 11.4–15.2)

## 2016-04-16 LAB — MRSA PCR SCREENING: MRSA BY PCR: NEGATIVE

## 2016-04-16 LAB — CBC WITH DIFFERENTIAL/PLATELET
BASOS ABS: 0 10*3/uL (ref 0.0–0.1)
Basophils Relative: 0 %
EOS ABS: 0 10*3/uL (ref 0.0–0.7)
Eosinophils Relative: 0 %
HCT: 33.8 % — ABNORMAL LOW (ref 39.0–52.0)
Hemoglobin: 10.8 g/dL — ABNORMAL LOW (ref 13.0–17.0)
LYMPHS ABS: 0.5 10*3/uL — AB (ref 0.7–4.0)
Lymphocytes Relative: 16 %
MCH: 29.2 pg (ref 26.0–34.0)
MCHC: 32 g/dL (ref 30.0–36.0)
MCV: 91.4 fL (ref 78.0–100.0)
MONO ABS: 0.1 10*3/uL (ref 0.1–1.0)
MONOS PCT: 5 %
NEUTROS ABS: 2.3 10*3/uL (ref 1.7–7.7)
Neutrophils Relative %: 79 %
PLATELETS: 84 10*3/uL — AB (ref 150–400)
RBC: 3.7 MIL/uL — AB (ref 4.22–5.81)
RDW: 19.3 % — AB (ref 11.5–15.5)
WBC Morphology: INCREASED
WBC: 2.9 10*3/uL — AB (ref 4.0–10.5)

## 2016-04-16 LAB — T4, FREE: FREE T4: 0.59 ng/dL — AB (ref 0.61–1.12)

## 2016-04-16 LAB — COMPREHENSIVE METABOLIC PANEL
ALK PHOS: 63 U/L (ref 38–126)
ALT: 29 U/L (ref 17–63)
AST: 42 U/L — AB (ref 15–41)
Albumin: 2.8 g/dL — ABNORMAL LOW (ref 3.5–5.0)
Anion gap: 14 (ref 5–15)
BILIRUBIN TOTAL: 1.1 mg/dL (ref 0.3–1.2)
BUN: 64 mg/dL — AB (ref 6–20)
CALCIUM: 8.4 mg/dL — AB (ref 8.9–10.3)
CO2: 27 mmol/L (ref 22–32)
CREATININE: 5.06 mg/dL — AB (ref 0.61–1.24)
Chloride: 93 mmol/L — ABNORMAL LOW (ref 101–111)
GFR calc Af Amer: 13 mL/min — ABNORMAL LOW (ref >60)
GFR, EST NON AFRICAN AMERICAN: 11 mL/min — AB (ref >60)
Glucose, Bld: 108 mg/dL — ABNORMAL HIGH (ref 65–99)
POTASSIUM: 3.9 mmol/L (ref 3.5–5.1)
Sodium: 134 mmol/L — ABNORMAL LOW (ref 135–145)
TOTAL PROTEIN: 6.2 g/dL — AB (ref 6.5–8.1)

## 2016-04-16 LAB — I-STAT CG4 LACTIC ACID, ED
LACTIC ACID, VENOUS: 2.69 mmol/L — AB (ref 0.5–1.9)
LACTIC ACID, VENOUS: 2.97 mmol/L — AB (ref 0.5–1.9)

## 2016-04-16 LAB — AMMONIA: AMMONIA: 15 umol/L (ref 9–35)

## 2016-04-16 LAB — ETHANOL: Alcohol, Ethyl (B): <5 mg/dL (ref <5)

## 2016-04-16 LAB — TSH: TSH: 0.098 u[IU]/mL — AB (ref 0.350–4.500)

## 2016-04-16 LAB — LACTIC ACID, PLASMA
LACTIC ACID, VENOUS: 3.4 mmol/L — AB (ref 0.5–1.9)
Lactic Acid, Venous: 2.4 mmol/L (ref 0.5–1.9)

## 2016-04-16 MED ORDER — ALBUMIN HUMAN 25 % IV SOLN
25.0000 g | Freq: Once | INTRAVENOUS | Status: AC
  Administered 2016-04-16: 25 g via INTRAVENOUS
  Filled 2016-04-16: qty 100

## 2016-04-16 MED ORDER — NICOTINE 14 MG/24HR TD PT24
14.0000 mg | MEDICATED_PATCH | Freq: Every day | TRANSDERMAL | Status: DC
  Administered 2016-04-16 – 2016-04-19 (×4): 14 mg via TRANSDERMAL
  Filled 2016-04-16 (×4): qty 1

## 2016-04-16 MED ORDER — PANTOPRAZOLE SODIUM 40 MG PO TBEC
40.0000 mg | DELAYED_RELEASE_TABLET | Freq: Every day | ORAL | Status: DC
  Administered 2016-04-16 – 2016-04-19 (×3): 40 mg via ORAL
  Filled 2016-04-16 (×3): qty 1

## 2016-04-16 MED ORDER — SODIUM CHLORIDE 0.9 % IV SOLN
2000.0000 mg | Freq: Once | INTRAVENOUS | Status: DC
  Filled 2016-04-16: qty 2000

## 2016-04-16 MED ORDER — INSULIN ASPART 100 UNIT/ML ~~LOC~~ SOLN: 0.0000 [IU] | Freq: Every day | SUBCUTANEOUS | Status: DC

## 2016-04-16 MED ORDER — CALCIUM ACETATE (PHOS BINDER) 667 MG PO CAPS
1334.0000 mg | ORAL_CAPSULE | Freq: Three times a day (TID) | ORAL | Status: DC
  Administered 2016-04-17 (×3): 1334 mg via ORAL
  Filled 2016-04-16 (×3): qty 2

## 2016-04-16 MED ORDER — DEXTROSE 5 % IV SOLN
1.0000 g | Freq: Once | INTRAVENOUS | Status: AC
  Administered 2016-04-16: 1 g via INTRAVENOUS
  Filled 2016-04-16: qty 1

## 2016-04-16 MED ORDER — METOPROLOL TARTRATE 25 MG PO TABS
100.0000 mg | ORAL_TABLET | Freq: Two times a day (BID) | ORAL | Status: DC
  Administered 2016-04-16: 100 mg via ORAL
  Filled 2016-04-16: qty 4

## 2016-04-16 MED ORDER — HYDRALAZINE HCL 50 MG PO TABS: 50.0000 mg | ORAL_TABLET | Freq: Three times a day (TID) | ORAL | Status: DC

## 2016-04-16 MED ORDER — TIOTROPIUM BROMIDE MONOHYDRATE 18 MCG IN CAPS
18.0000 ug | ORAL_CAPSULE | Freq: Every day | RESPIRATORY_TRACT | Status: DC
  Administered 2016-04-17 – 2016-04-19 (×3): 18 ug via RESPIRATORY_TRACT
  Filled 2016-04-16: qty 5

## 2016-04-16 MED ORDER — DILTIAZEM HCL-DEXTROSE 100-5 MG/100ML-% IV SOLN (PREMIX)
5.0000 mg/h | INTRAVENOUS | Status: DC
  Administered 2016-04-16: 5 mg/h via INTRAVENOUS
  Administered 2016-04-16: 15 mg/h via INTRAVENOUS
  Administered 2016-04-17 – 2016-04-18 (×3): 5 mg/h via INTRAVENOUS
  Filled 2016-04-16 (×3): qty 100

## 2016-04-16 MED ORDER — WARFARIN - PHARMACIST DOSING INPATIENT
Freq: Every day | Status: DC
  Administered 2016-04-19 – 2016-04-20 (×2)

## 2016-04-16 MED ORDER — CALCITRIOL 0.5 MCG PO CAPS
ORAL_CAPSULE | ORAL | Status: AC
  Administered 2016-04-16: 0.5 ug via ORAL
  Filled 2016-04-16: qty 1

## 2016-04-16 MED ORDER — ALBUMIN HUMAN 25 % IV SOLN
INTRAVENOUS | Status: AC
  Administered 2016-04-16: 25 g via INTRAVENOUS
  Filled 2016-04-16: qty 100

## 2016-04-16 MED ORDER — VITAMIN D 1000 UNITS PO TABS
1000.0000 [IU] | ORAL_TABLET | Freq: Every day | ORAL | Status: DC
  Administered 2016-04-16 – 2016-04-17 (×2): 1000 [IU] via ORAL
  Filled 2016-04-16 (×2): qty 1

## 2016-04-16 MED ORDER — SIMVASTATIN 40 MG PO TABS
20.0000 mg | ORAL_TABLET | Freq: Every day | ORAL | Status: DC
  Administered 2016-04-17: 20 mg via ORAL
  Filled 2016-04-16: qty 1

## 2016-04-16 MED ORDER — SODIUM CHLORIDE 0.9% FLUSH
3.0000 mL | Freq: Two times a day (BID) | INTRAVENOUS | Status: DC
  Administered 2016-04-16 – 2016-04-26 (×16): 3 mL via INTRAVENOUS

## 2016-04-16 MED ORDER — LEVOTHYROXINE SODIUM 88 MCG PO TABS
88.0000 ug | ORAL_TABLET | Freq: Every day | ORAL | Status: DC
  Administered 2016-04-17 – 2016-04-27 (×9): 88 ug via ORAL
  Filled 2016-04-16 (×10): qty 1

## 2016-04-16 MED ORDER — SODIUM CHLORIDE 0.9 % IV BOLUS (SEPSIS)
250.0000 mL | Freq: Once | INTRAVENOUS | Status: AC
  Administered 2016-04-16: 250 mL via INTRAVENOUS

## 2016-04-16 MED ORDER — INSULIN GLARGINE 100 UNIT/ML SOLOSTAR PEN: 15.0000 [IU] | PEN_INJECTOR | Freq: Every day | SUBCUTANEOUS | Status: DC

## 2016-04-16 MED ORDER — SODIUM CHLORIDE 0.9 % IV SOLN
2000.0000 mg | Freq: Once | INTRAVENOUS | Status: AC
  Administered 2016-04-16: 2000 mg via INTRAVENOUS
  Filled 2016-04-16 (×2): qty 2000

## 2016-04-16 MED ORDER — INSULIN ASPART 100 UNIT/ML ~~LOC~~ SOLN
0.0000 [IU] | Freq: Three times a day (TID) | SUBCUTANEOUS | Status: DC
  Administered 2016-04-18 (×3): 1 [IU] via SUBCUTANEOUS
  Administered 2016-04-19: 2 [IU] via SUBCUTANEOUS
  Administered 2016-04-19: 3 [IU] via SUBCUTANEOUS

## 2016-04-16 MED ORDER — STROKE: EARLY STAGES OF RECOVERY BOOK
Freq: Once | Status: AC
  Administered 2016-04-16
  Filled 2016-04-16: qty 1

## 2016-04-16 MED ORDER — CALCITRIOL 0.5 MCG PO CAPS
0.5000 ug | ORAL_CAPSULE | ORAL | Status: DC
  Administered 2016-04-16 – 2016-04-26 (×2): 0.5 ug via ORAL
  Filled 2016-04-16 (×5): qty 1

## 2016-04-16 MED ORDER — DARBEPOETIN ALFA 100 MCG/0.5ML IJ SOSY: 100.0000 ug | PREFILLED_SYRINGE | INTRAMUSCULAR | Status: DC

## 2016-04-16 MED ORDER — OXYCODONE HCL 5 MG PO TABS
ORAL_TABLET | ORAL | Status: AC
  Administered 2016-04-16: 15 mg via ORAL
  Filled 2016-04-16: qty 3

## 2016-04-16 MED ORDER — RENA-VITE PO TABS
1.0000 | ORAL_TABLET | Freq: Every day | ORAL | Status: DC
  Administered 2016-04-16 – 2016-04-26 (×10): 1 via ORAL
  Filled 2016-04-16 (×10): qty 1

## 2016-04-16 MED ORDER — INSULIN GLARGINE 100 UNIT/ML ~~LOC~~ SOLN
15.0000 [IU] | Freq: Every day | SUBCUTANEOUS | Status: DC
  Administered 2016-04-17 – 2016-04-18 (×2): 15 [IU] via SUBCUTANEOUS
  Filled 2016-04-16 (×4): qty 0.15

## 2016-04-16 MED ORDER — OXYCODONE HCL 5 MG PO TABS
15.0000 mg | ORAL_TABLET | Freq: Four times a day (QID) | ORAL | Status: DC | PRN
Start: 2016-04-16 — End: 2016-04-18
  Administered 2016-04-16 – 2016-04-17 (×2): 15 mg via ORAL
  Filled 2016-04-16: qty 3

## 2016-04-16 MED ORDER — VANCOMYCIN HCL IN DEXTROSE 1-5 GM/200ML-% IV SOLN: 1000.0000 mg | INTRAVENOUS | Status: DC

## 2016-04-16 MED ORDER — SODIUM CHLORIDE 0.9 % IV BOLUS (SEPSIS)
500.0000 mL | Freq: Once | INTRAVENOUS | Status: AC
  Administered 2016-04-16: 500 mL via INTRAVENOUS

## 2016-04-16 MED ORDER — CEFEPIME HCL 1 G IJ SOLR
1.0000 g | INTRAMUSCULAR | Status: DC
  Administered 2016-04-17 – 2016-04-19 (×3): 1 g via INTRAVENOUS
  Filled 2016-04-16 (×4): qty 1

## 2016-04-16 MED ORDER — DEXTROSE 5 % IV SOLN
1.0000 g | Freq: Once | INTRAVENOUS | Status: DC
  Filled 2016-04-16: qty 1

## 2016-04-16 MED ORDER — NA FERRIC GLUC CPLX IN SUCROSE 12.5 MG/ML IV SOLN
125.0000 mg | INTRAVENOUS | Status: DC
  Administered 2016-04-16 – 2016-04-26 (×4): 125 mg via INTRAVENOUS
  Filled 2016-04-16 (×9): qty 10

## 2016-04-16 MED ORDER — SODIUM CHLORIDE 0.9 % IV BOLUS (SEPSIS): 250.0000 mL | Freq: Once | INTRAVENOUS | Status: AC

## 2016-04-16 MED ORDER — PHYTONADIONE 5 MG PO TABS
5.0000 mg | ORAL_TABLET | Freq: Once | ORAL | Status: AC
  Administered 2016-04-16: 5 mg via ORAL
  Filled 2016-04-16: qty 1

## 2016-04-16 MED ORDER — SODIUM CHLORIDE 0.9 % IV SOLN
2000.0000 mg | INTRAVENOUS | Status: DC
  Filled 2016-04-16: qty 2000

## 2016-04-16 NOTE — ED Notes (Signed)
Dr. Leonides Schanz was informed of the critical lab result (KT)

## 2016-04-16 NOTE — Progress Notes (Addendum)
Pharmacy Antibiotic Note and ANTICOAGULATION CONSULT NOTE - Initial Pharmacy has been consulted for Vancomycin, Cefepime and Coumadin dosing  Johnny Sims is a 60 y.o. male admitted on 04/14/2016 with AMS/slurredspeech, post fall on 04/14/16, generalized weakness. Recent hospitalization 03/26/16-04/02/16.  Patient was seen in the ER on 04/14/16 after a fall at home and found to have an elevated INR. He was instructed to hold his warfarin for three days, then restart and have INR rechecked. He reports he did not take his warfarin. INR increased from ER visit 10/19 to 8.72.    Antibiotics:   Vancomycin and Cefepime dosing for leukopenia unclear etiology.   ESRD recently started on HD earlier this month (on 03/29/16).  Pt reported he is due for dialysis today at 11 am. Last session was Thursday 04/14/16.  Leukopenia: With elevated venous lactic acid. Afebrile    Anticoagulation:  Coumadin on PTA for h/o Afib, stroke prophylaxis.   Supratherapeutic INR on admit= 8.72 INR has increased from recent ER visit 10/19 due to a fall,  when INR was 5.93,patient was Instructed at that time to hold warfarin for 3 days. On this admission 10/21 the MD notes pt able to report that he did not take his warfarin pta.  H/H low stable, pltc 84>73k. No bleeding reported.  -Vitamin K 5 mg po given this morning.  Will f/u INR daily  PTA coumadin dose:  awaiting med rec completion-unable to complete earlier due to patient incoherant at that time.  Goal of Therapy:  INR 2-3 Monitor platelets by anticoagulation protocol: Yes   Plan: Cefepime 1 g IV x1 now, then Vancomycin 2 g IV x1 now.   Follow up on plans for hemodialysis then order post HD cefepime and vancomycin doses.  No coumadin tonight. F/u INR in AM Daily PT/INR   Height: 6\' 2"  (188 cm) Weight: 289 lb 0.4 oz (131.1 kg) IBW/kg (Calculated) : 82.2  Heparin Dosing Weight:   Temp (24hrs), Avg:97.4 F (36.3 C), Min:97.3 F (36.3 C), Max:97.4 F (36.3  C)   Recent Labs Lab 04/14/16 0333 04/19/2016 0433 04/15/2016 0506 04/01/2016 0800 04/07/2016 0822 04/04/2016 1001  WBC 10.4 2.9*  --  1.3*  --   --   CREATININE 5.30* 5.06*  --   --   --   --   LATICACIDVEN  --   --  2.69*  --  2.97* 3.4*    Estimated Creatinine Clearance: 22.4 mL/min (by C-G formula based on SCr of 5.06 mg/dL (H)).    Recent Labs  04/14/16 0333 03/28/2016 0433 04/19/2016 0800  HGB 8.7* 10.8* 9.6*  HCT 26.3* 33.8* 29.8*  PLT 88* 84* 73*  LABPROT 54.8* 74.7*  --   INR 5.93* 8.72*  --   CREATININE 5.30* 5.06*  --     Allergies  Allergen Reactions  . Novolog [Insulin Aspart] Other (See Comments)    UNSPECIFIED REACTION  "I'm just highly allergic"  . Hydrocodone-Acetaminophen Palpitations and Other (See Comments)    Doesn't want to take  . Iodides Other (See Comments)    Patient states he doesn't know what iodine is and doesn't think he is allergic to it despite it being listed with his allergies  . Other Hives and Other (See Comments)    Steroid creams "Corndogs"- Nausea & vomiting   . Sulfacetamide Sodium Hives and Itching  . Tylenol [Acetaminophen] Nausea And Vomiting  . Gabapentin Swelling    SWELLING REACTION UNSPECIFIED   . Oxycodone-Acetaminophen Nausea And Vomiting  . Antihistamines,  Chlorpheniramine-Type Other (See Comments)    unknown  . Hydralazine Rash  . Iodinated Diagnostic Agents Other (See Comments)    Patient states he doesn't know what iodine is and doesn't think he is allergic to it despite it being listed with his allergies  . Latex Rash  . Lidocaine Itching and Other (See Comments)    Patient is uncertain of this allergy  . Pentazocine Other (See Comments)    unknown  . Pheniramine Other (See Comments)    unknown  . Sulfa Antibiotics Itching and Other (See Comments)  . Tape Rash  . Vicodin [Hydrocodone-Acetaminophen] Palpitations and Other (See Comments)    Doesn't want to take    Antimicrobials this admission: 10/21  Vancomycin>> 10/21 Cefepime>>  Dose adjustments this admission:   Microbiology results: 10/21 BCx: sent 10/21 UCx: sent 10/21 MRSA PCR: sent  Thank you for allowing pharmacy to be a part of this patient's care. Nicole Cella, RPh Clinical Pharmacist Pager: [phone] 04/12/2016,11:37 AM    ADDENDUM:  HD done today . HD qTTS  Plan: Cefepime 1g IV q24h  Vancomycin 1g IV after each HD qTTS F/u HD schedule.   Nicole Cella, RPh Clinical Pharmacist Pager: [phone] 04/13/2016 10:05 PM

## 2016-04-16 NOTE — Progress Notes (Signed)
CRITICAL VALUE ALERT  Critical value received:  WBC: 1.3  Date of notification:  04/04/2016  Time of notification:  1010  Critical value read back:Yes.    Nurse who received alert:  Pieter Partridge, RN  MD notified (1st page):  IMTS  Time of first page:  1012  MD notified (2nd page):  Time of second page:  Responding MD:  Dr. Philipp Ovens  Time MD responded:  [place], RN

## 2016-04-16 NOTE — ED Notes (Signed)
Attempted to call report x2

## 2016-04-16 NOTE — Plan of Care (Signed)
Problem: Pain Managment: Goal: General experience of comfort will improve Outcome: Progressing Patient pain medication delayed d/t AMS; however, with improvement and increased complaints, patient received pain medication while in dialysis. After returning from dialysis, patient with increased AMS. Night shift RN made aware, will notify MD.  Problem: Skin Integrity: Goal: Risk for impaired skin integrity will decrease Outcome: Progressing Patient with varying degrees of skin breakdown, cellulitis, bruising, and pressure ulcers. Full assessment finished and will put in for WOC.

## 2016-04-16 NOTE — ED Notes (Addendum)
Pt very hard stick, attempts tried by multiple staff. Lab gone for rest of shift. IV successful, but only one set of cultures retrieved.

## 2016-04-16 NOTE — ED Triage Notes (Signed)
Pt fell at home with unknown downtime. Brusing noted to left cheek and chest. Pt states those are older. Pt stopped anticoagulates three days ago and is c/o of red swollen and hot left leg that he wants evaluated.

## 2016-04-16 NOTE — ED Notes (Signed)
Spoke to pharmacy, IV abx will be tubed soon. They are delayed per staff.

## 2016-04-16 NOTE — H&P (Signed)
Date: 04/15/2016               Patient Name:  Johnny Sims MRN: [id number]  DOB: 1956-02-28 Age / Sex: 60 y.o., male   PCP: [place], [place]         Medical Service: Internal Medicine Teaching Service         Attending Physician: Dr. Aldine Contes, MD    First Contact: Dr. Velna Ochs Pager: [phone]  Second Contact: Dr. Jule Ser Pager: [phone]       After Hours (After 5p/  First Contact Pager: [phone]  weekends / holidays): Second Contact Pager: [phone]   Chief Complaint: Fall, AMS  History of Present Illness: Patient is a 60 yo M with pmhx significant for HTN, ESRD recently started on HD earlier this month, HFpEF, DM II, and atrial fibrillation on warfarin who presents from home with generalized weakness. History was limited due to AMS and slurred speech. Patient was seen int he ER yesterday after a fall at home and found to have an elevated INR. He was instructed to hold his warfarin for three days, then restart and have INR rechecked. In the ER is he speaking in full sentences but with slurred speech and not able to answer many questions. He was able to report that he did not take his warfarin. He also complained of cough said he is due for dialysis today at 11 am. Last session was Thursday.   On arrival to ED, it appears per documented vitals that patient was initially bradypneic with RR 6-9. HR was 107, BP 120/88, T 97.4 and he was sating 100% on RA. By the time he was evaluated by the admitting team, patient was in RVR with rates up to the 140s and RR had come up to 20. BP remained stable and he continued to be afebrile. Labs were significant for elevated creatinine of 5.06, BUN 65. INR was elevated 8.72 (5.93 two days ago). CBC showed leukopenia with wbc 2.9 that persisted on repeat lab draw, wbc 1.9. Hgb was stable at baseline 9.6 and platelet were also low at 84, and 73 on repeat lab draw. Venous lactic acid elevated at 2.69 and 2.97 on repeat. CXR showed  cardiomegaly, mild congestive changes similar to prior films, and a questionable PNA. He received a 500 cc bolus in the ED and 2 g IV vancomycin.   Meds:  No outpatient prescriptions have been marked as taking for the 03/30/2016 encounter [facility] Encounter).     Allergies: Allergies as of 04/24/2016 - Review Complete 04/19/2016  Allergen Reaction Noted  . Novolog [insulin aspart] Other (See Comments) 09/26/2014  . Hydrocodone-acetaminophen Palpitations and Other (See Comments) 06/23/2015  . Iodides Other (See Comments) 10/07/2015  . Other Hives and Other (See Comments) 10/14/2013  . Sulfacetamide sodium Hives and Itching 10/07/2015  . Tylenol [acetaminophen] Nausea And Vomiting 06/23/2015  . Gabapentin Swelling 10/07/2015  . Oxycodone-acetaminophen Nausea And Vomiting 03/26/2016  . Antihistamines, chlorpheniramine-type Other (See Comments) 03/31/2011  . Hydralazine Rash 03/26/2016  . Iodinated diagnostic agents Other (See Comments) 03/31/2011  . Latex Rash 03/26/2016  . Lidocaine Itching and Other (See Comments) 10/14/2013  . Pentazocine Other (See Comments) 10/07/2015  . Pheniramine Other (See Comments) 10/07/2015  . Sulfa antibiotics Itching and Other (See Comments) 07/22/2012  . Tape Rash 03/26/2016  . Vicodin [hydrocodone-acetaminophen] Palpitations and Other (See Comments) 03/31/2011   Past Medical History:  Diagnosis Date  . Anxiety   . Asthma   .  Bipolar 1 disorder ([place])   . Chronic pain   . CKD (chronic kidney disease), stage II   . COPD (chronic obstructive pulmonary disease) ([place])   . Diabetes mellitus   . Fibromyalgia   . GERD (gastroesophageal reflux disease)   . Hepatitis C   . Hepatitis C   . Hyperlipidemia   . Hypertension   . Hypothyroidism   . Melanoma ([place])    face, shoulder arm  . OA (osteoarthritis)   . PAF (paroxysmal atrial fibrillation) ([place])   . Pancreatitis, acute 2015  . RA (rheumatoid arthritis) ([place])   . Venous stasis     Family  History: Unable to obtain due to AMS  Social History: Unable to obtain due to AMS  Review of Systems: A complete ROS was negative except as per HPI.   Physical Exam: Blood pressure (!) 145/82, pulse (!) 153, temperature 97.3 F (36.3 C), temperature source Oral, resp. rate 20, height 6\' 2"  (1.88 m), weight 289 lb 0.4 oz (131.1 kg), SpO2 100 %. Physical Exam Constitutional: Altered with slurred speech  HEENT: Large right facial hematoma, Airway was patent and he was breathing comfortable on room air Cardiovascular: Tachycardic and irregular, no murmurs, rubs, or gallops.  Pulmonary/Chest: CTAB, no wheezes, rales, or rhonchi. Right IJ TCD in place  Abdominal: Soft, non tender, non distended. +BS.  Extremities: 3-4 + pitting edema to the knees bilaterally, which chronic overlying skin changes consistent with venous status dermatitis. 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  Skin: Mid back with small black cirrcular lesion with surrounding scarring at previous melanoma bx site Psychiatric: Normal mood and affect  EKG: Personally reviewed, atrial fibrillation with ventricular rate of 120. T waves are more prominent in V3 and V4 than prior tracing but no ischemic changes.   CXR: Personally reviewed. Cardiomegaly with mild vascular congestion. Largely unchanged from prior films.   Assessment & Plan by Problem:  AMS / Slurred speech: Concerning for medication induced vs CVA vs hemorrhagic stroke. Head CT negative in ED and patient is supratheurapeutic with INR of 8.7. Patient had respiratory depression initially on presentation but has now improved.  -- MRI brain wo contrast -- Consider narcan if MRI negative and AMS has not improved  -- Hold all sedating meds (Home oxy, diazepam) -- UDS  Leukopenia: With elevated venous lactic acid. Patient is afebrile and vitals are currently stable. CXR showed questionable PNA however films  are largely unchanged from last admission. Patient is complaining of cough but lungs are clear on exam. Patient is a dialysis patient with right IJ TCD, high risk for blood stream infections.  -- Monitor closely for signs of infection  -- S/p 500 cc bolus and IV vanc in ED -- Blood cultures pending -- Hold off on further fluid resuscitation given ESRD on HD -- Continue IV vancomycin and cefepime per pharmacy  -- HIV antibodies pending   Supratherapeutic INR: On warfarin for stroke prophylaxis in the setting of chronic a-fib.  -- PO vit K 5 mg -- Daily INR -- Pharmacy consult for warfarin dosing; hold for now   Atrial fibrillation RVR: Rates up to 140s on evaluation.  -- Tele monitoring -- Will give dose of metoprolol 100 mg now, then BID -- Consider diltiazem ggt if still uncontrolled   ESRD on HD: T/Th/Sat schedule. Recently initiated dialysis earlier this month with R IJ TCD and LUE fistula in place. Due for dialysis today.  Lungs are clear bilaterally and patient is sating 100% on RA. He does have increased lower extremity edema from prior admission. Dry weight has not yet been established.  -- Nephrology consulted, appreciate recommendations -- HD today  -- Fluid restrict, I/Os, daily weights -- Daily hepatic function panel  -- Continue home calcitriol, Vitamin D, and phoslo  -- Continue home Aranesp injections Q Thursday   COPD: Patient complaining of cough and CXR showed possible PNA. Lungs are clear and patient is not wheezing.  -- Continue IV antibiotics as above -- Continue home Spiriva daily  -- Avoid albuterol for now given RVR  DM II:  -- SSI with meals and QHS -- Lantus 15 QHS  HTN: -- Continue home Hydralazine 50 mg TID  HLD: -- Continue home simvastatin   Hypothyroidism: -- TSH low this admission however unsure relevance in acute illness -- Continue home dose synthroid 88 mcg daily   FEN: fluid restrict, hemodialysis, renal diet  VTE ppx: warfarin per  pharmacy - holding for now Code Status: FULL   Dispo: Admit patient to Inpatient with expected length of stay greater than 2 midnights.  Signed: Velna Ochs, MD 04/13/2016, 9:48 AM  Pager: [phone]

## 2016-04-16 NOTE — Consult Note (Signed)
Admission H&P    Chief Complaint: Recurrent falls and slurred speech.  HPI: Johnny KARDELL is an 60 y.o. male with a history of end-stage renal disease on hemodialysis, hypertension, diabetes mellitus, atrial fibrillation on Coumadin, hypertension and hyperlipidemia, who presented with recurrent falls at home and markedly elevated INR (8.72), as well as slurred speech. He has been afebrile. WBC count was markedly low at 1.3. Lactic acid was 3.4. Blood glucose was 108. CT scan of the head showed no acute intracranial abnormality. 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 No focal weaknesses been reported. As well no facial droop has been reported.  LSN: Unclear tPA Given: No: No clear focal deficits; unknown when patient was last well. mRankin:  Past Medical History:  Diagnosis Date  . Anxiety   . Asthma   . Bipolar 1 disorder ([place])   . Chronic pain   . CKD (chronic kidney disease), stage II   . COPD (chronic obstructive pulmonary disease) ([place])   . Diabetes mellitus   . Fibromyalgia   . GERD (gastroesophageal reflux disease)   . Hepatitis C   . Hepatitis C   . Hyperlipidemia   . Hypertension   . Hypothyroidism   . Melanoma ([place])    face, shoulder arm  . OA (osteoarthritis)   . PAF (paroxysmal atrial fibrillation) ([place])   . Pancreatitis, acute 2015  . RA (rheumatoid arthritis) ([place])   . Venous stasis     Past Surgical History:  Procedure Laterality Date  . AMPUTATION Left 11/11/2014   Procedure: FOOT, SECOND [place] AMPUTATION;  Surgeon: Newt Minion, MD;  Location: [facility];  Service: Orthopedics;  Laterality: Left;  . BASCILIC VEIN TRANSPOSITION Left 03/29/2016   Procedure: LEFT FIRST STAGE BASILIC VEIN TRANSPOSITION;  Surgeon: Waynetta Sandy, MD;  Location: [facility];  Service: Vascular;  Laterality: Left;  . CARDIOVERSION  02/03/2015  . CARDIOVERSION N/A 02/03/2015   Procedure: CARDIOVERSION;  Surgeon: Thayer Headings, MD;  Location: [facility];  Service:  Cardiovascular;  Laterality: N/A;  . FRACTURE SURGERY Left    arm  . HERNIA REPAIR     umbicial  . INGUINAL HERNIA REPAIR Bilateral   . INSERTION OF DIALYSIS CATHETER Right 03/29/2016   Procedure: INSERTION OF RIGHT Internal Jugular DIALYSIS CATHETER;  Surgeon: Waynetta Sandy, MD;  Location: [facility];  Service: Vascular;  Laterality: Right;  . KNEE SURGERY     /H&P 02/20/2010  . SKIN CANCER EXCISION     melonoma  . SUBMANDIBULAR GLAND EXCISION Left 12/25/2013   w/sialolithotomy  . SUBMANDIBULAR GLAND EXCISION Left 12/25/2013   Procedure: SUBMANDIBULAR GLAND STONE AND CHRONIC SIALOLITHIASIS EXCISION;  Surgeon: Melida Quitter, MD;  Location: [facility];  Service: ENT;  Laterality: Left;  . THYROIDECTOMY    . TONSILLECTOMY    . US ECHOCARDIOGRAPHY  02/19/2010   EF 55-60%    Family History  Problem Relation Age of Onset  . Hypertension Mother   . Alzheimer's disease Mother   . Heart disease Father    Social History:  reports that he has been smoking Cigarettes.  He has a 43.00 pack-year smoking history. He has never used smokeless tobacco. He reports that he drinks alcohol. He reports that he does not use drugs.  Allergies:  Allergies  Allergen Reactions  . Hydrocodone-Acetaminophen Palpitations and Other (See Comments)    Doesn't want to take  . Iodides Other (See Comments)    Patient states he doesn't know what iodine is and doesn't  think he is allergic to it despite it being listed with his allergies  . Other Hives and Other (See Comments)    Steroid creams "Corndogs"- Nausea & vomiting   . Sulfacetamide Sodium Hives and Itching  . Tylenol [Acetaminophen] Nausea And Vomiting  . Gabapentin Swelling    SWELLING REACTION UNSPECIFIED   . Oxycodone-Acetaminophen Nausea And Vomiting  . Antihistamines, Chlorpheniramine-Type Other (See Comments)    unknown  . Hydralazine Rash  . Iodinated Diagnostic Agents Other (See Comments)    Patient states he doesn't know what iodine is and  doesn't think he is allergic to it despite it being listed with his allergies  . Latex Rash  . Lidocaine Itching and Other (See Comments)    Patient is uncertain of this allergy  . Pentazocine Other (See Comments)    unknown  . Pheniramine Other (See Comments)    unknown  . Sulfa Antibiotics Itching and Other (See Comments)  . Tape Rash  . Vicodin [Hydrocodone-Acetaminophen] Palpitations and Other (See Comments)    Doesn't want to take    Medications Prior to Admission  Medication Sig Dispense Refill  . albuterol (PROVENTIL HFA;VENTOLIN HFA) 108 (90 BASE) MCG/ACT inhaler Inhale 2 puffs into the lungs every 4 (four) hours as needed for wheezing or shortness of breath.    Marland Kitchen albuterol (PROVENTIL) (2.5 MG/3ML) 0.083% nebulizer solution Take 2.5 mg by nebulization 4 (four) times daily.    . calcium acetate (PHOSLO) 667 MG capsule Take 2 capsules (1,334 mg total) by mouth 3 (three) times daily with meals. 180 capsule 0  . cholecalciferol (VITAMIN D) 1000 units tablet Take 1,000 Units by mouth daily.    Marland Kitchen COUMADIN 10 MG tablet Take as directed by the Coumadin clinic. (Patient taking differently: Take 10 mg by mouth See admin instructions. Mon/Tues/Wed/Fri/Sat (in the evening)) 25 tablet 3  . COUMADIN 7.5 MG tablet Take as directed by Coumadin clinic. (Patient taking differently: Take 7.5 mg by mouth. Sun/Thurs (in the evening)) 10 tablet 3  . Darbepoetin Alfa (ARANESP) 100 MCG/0.5ML SOSY injection Inject 0.5 mLs (100 mcg total) into the vein every Tuesday with hemodialysis. 4.2 mL 0  . diazepam (VALIUM) 10 MG tablet Take 10 mg by mouth every 4 (four) hours as needed for anxiety.     . hydrALAZINE (APRESOLINE) 50 MG tablet Take 1 tablet (50 mg total) by mouth 3 (three) times daily. 90 tablet 0  . Insulin Glargine (LANTUS SOLOSTAR) 100 UNIT/ML Solostar Pen Inject 30 Units into the skin daily at 10 pm. 15 mL 11  . levothyroxine (SYNTHROID) 88 MCG tablet Take 1 tablet (88 mcg total) by mouth daily  before breakfast. Will send additional refills based off blood level (Patient taking differently: Take 88 mcg by mouth daily before breakfast. ) 30 tablet 0  . metoprolol (LOPRESSOR) 100 MG tablet Take 1 tablet (100 mg total) by mouth 2 (two) times daily. 60 tablet 0  . multivitamin (RENA-VIT) TABS tablet Take 1 tablet by mouth at bedtime. 30 tablet 0  . nicotine (NICODERM CQ - DOSED IN MG/24 HOURS) 14 mg/24hr patch Place 14 mg onto the skin daily.    Marland Kitchen omeprazole (PRILOSEC) 20 MG capsule Take 1 capsule (20 mg total) by mouth daily. Take with [place] at the same time under FASTING condition (Patient taking differently: Take 20 mg by mouth daily. ) 90 capsule 11  . oxyCODONE (ROXICODONE) 15 MG immediate release tablet Take 1 tablet (15 mg total) by mouth every 3 (three) hours as  needed for pain. 49 tablet 0  . psyllium (HYDROCIL/METAMUCIL) 95 % PACK Take 1 packet by mouth daily. (Patient not taking: Reported on 03/26/2016) 56 each 3  . simvastatin (ZOCOR) 20 MG tablet Take 1 tablet (20 mg total) by mouth daily at 6 PM. (Patient taking differently: Take 10 mg by mouth daily at 6 PM. ) 90 tablet 5  . SPIRIVA HANDIHALER 18 MCG inhalation capsule INHALE CONTENTS OF ONE CAPSULE ONCE A DAY 30 capsule 11  . SURE COMFORT PEN NEEDLES 31G X 8 MM MISC USE ONE NIGHTLY 100 each 12    ROS: History obtained from chart review and the patient  General ROS: negative for - chills, fatigue, fever, night sweats, weight gain or weight loss Psychological ROS: negative for - behavioral disorder, hallucinations, memory difficulties, mood swings or suicidal ideation Ophthalmic ROS: negative for - blurry vision, double vision, eye pain or loss of vision ENT ROS: negative for - epistaxis, nasal discharge, oral lesions, sore throat, tinnitus or vertigo Allergy and Immunology ROS: negative for - hives or itchy/watery eyes Hematological and Lymphatic ROS: negative for - bleeding problems, bruising or swollen lymph  nodes Endocrine ROS: negative for - galactorrhea, hair pattern changes, polydipsia/polyuria or temperature intolerance Respiratory ROS: negative for - cough, hemoptysis, shortness of breath or wheezing Cardiovascular ROS: negative for - chest pain, dyspnea on exertion, edema or irregular heartbeat Gastrointestinal ROS: negative for - abdominal pain, diarrhea, hematemesis, nausea/vomiting or stool incontinence Genito-Urinary ROS: negative for - dysuria, hematuria, incontinence or urinary frequency/urgency Musculoskeletal ROS: negative for - joint swelling or muscular weakness Neurological ROS: as noted in HPI Dermatological ROS: negative for rash and skin lesion changes  Physical Examination: Blood pressure (!) 94/54, pulse (!) 121, temperature 97.6 F (36.4 C), temperature source Oral, resp. rate 19, height '6\' 2"'$  (1.88 m), weight 126.4 kg (278 lb 10.6 oz), SpO2 (!) 82 %.  HEENT-  Normocephalic, no lesions, without obvious abnormality.  Normal external eye and conjunctiva.  Normal TM's bilaterally.  Normal auditory canals and external ears. Normal external nose, mucus membranes and septum.  Normal pharynx. Neck supple with no masses, nodes, nodules or enlargement. Cardiovascular - tachycardic, normal rhythm, normal S1 and S2, no murmur or gallop Lungs - chest clear, no wheezing, rales, normal symmetric air entry Abdomen - soft, non-tender; bowel sounds normal; no masses,  no organomegaly Extremities - edema of both legs and feet  Neurologic Examination: Mental Status: Lethargic, oriented, moderately short of breath.  Speech slurred without evidence of aphasia. Able to follow commands without difficulty. Cranial Nerves: II-Visual fields were normal. III/IV/VI-Pupils were equal and reacted. Extraocular movements were full and conjugate.    V/VII-no facial numbness and no facial weakness. VIII-normal. X-moderate dysarthria MI: symmetrical palatal movement. XI: trapezius strength/neck  flexion strength normal bilaterally XII-midline tongue extension with normal strength. Motor: Mild generalized weakness, upper extremities less than lower extremities; normal muscle tone throughout. Sensory: Normal throughout. Deep Tendon Reflexes: Trace to 1+ and symmetric. Plantars: Flexor bilaterally Cerebellar: Normal finger-to-nose testing. Carotid auscultation: Normal  Results for orders placed or performed during the hospital encounter of 03/29/2016 (from the past 48 hour(s))  Comprehensive metabolic panel     Status: Abnormal   Collection Time: 04/07/2016  4:33 AM  Result Value Ref Range   Sodium 134 (L) 135 - 145 mmol/L   Potassium 3.9 3.5 - 5.1 mmol/L   Chloride 93 (L) 101 - 111 mmol/L   CO2 27 22 - 32 mmol/L   Glucose, Bld 108 (H)  65 - 99 mg/dL   BUN 64 (H) 6 - 20 mg/dL   Creatinine, Ser 5.06 (H) 0.61 - 1.24 mg/dL   Calcium 8.4 (L) 8.9 - 10.3 mg/dL   Total Protein 6.2 (L) 6.5 - 8.1 g/dL   Albumin 2.8 (L) 3.5 - 5.0 g/dL   AST 42 (H) 15 - 41 U/L   ALT 29 17 - 63 U/L   Alkaline Phosphatase 63 38 - 126 U/L   Total Bilirubin 1.1 0.3 - 1.2 mg/dL   GFR calc non Af Amer 11 (L) >60 mL/min   GFR calc Af Amer 13 (L) >60 mL/min    Comment: (NOTE) The eGFR has been calculated using the CKD EPI equation. This calculation has not been validated in all clinical situations. eGFR's persistently <60 mL/min signify possible Chronic Kidney Disease.    Anion gap 14 5 - 15  CBC WITH DIFFERENTIAL     Status: Abnormal   Collection Time: 04/07/2016  4:33 AM  Result Value Ref Range   WBC 2.9 (L) 4.0 - 10.5 K/uL   RBC 3.70 (L) 4.22 - 5.81 MIL/uL   Hemoglobin 10.8 (L) 13.0 - 17.0 g/dL   HCT 33.8 (L) 39.0 - 52.0 %   MCV 91.4 78.0 - 100.0 fL   MCH 29.2 26.0 - 34.0 pg   MCHC 32.0 30.0 - 36.0 g/dL   RDW 19.3 (H) 11.5 - 15.5 %   Platelets 84 (L) 150 - 400 K/uL    Comment: CONSISTENT WITH PREVIOUS RESULT   Neutrophils Relative % 79 %   Lymphocytes Relative 16 %   Monocytes Relative 5 %    Eosinophils Relative 0 %   Basophils Relative 0 %   Neutro Abs 2.3 1.7 - 7.7 K/uL   Lymphs Abs 0.5 (L) 0.7 - 4.0 K/uL   Monocytes Absolute 0.1 0.1 - 1.0 K/uL   Eosinophils Absolute 0.0 0.0 - 0.7 K/uL   Basophils Absolute 0.0 0.0 - 0.1 K/uL   WBC Morphology INCREASED BANDS (>20% BANDS)   Protime-INR     Status: Abnormal   Collection Time: 04/22/2016  4:33 AM  Result Value Ref Range   Prothrombin Time 74.7 (H) 11.4 - 15.2 seconds   INR 8.72 (HH)     Comment: REPEATED TO VERIFY CRITICAL RESULT CALLED TO, READ BACK BY AND VERIFIED WITH: Gardiner Sleeper RN [id number] [phone] SHORT TIFFANY   Ethanol     Status: None   Collection Time: 04/02/2016  4:36 AM  Result Value Ref Range   Alcohol, Ethyl (B) <5 <5 mg/dL    Comment:        LOWEST DETECTABLE LIMIT FOR SERUM ALCOHOL IS 5 mg/dL FOR MEDICAL PURPOSES ONLY   Ammonia     Status: None   Collection Time: 04/04/2016  4:47 AM  Result Value Ref Range   Ammonia 15 9 - 35 umol/L  I-Stat CG4 Lactic Acid, ED  (not at  [facility])     Status: Abnormal   Collection Time: 04/04/2016  5:06 AM  Result Value Ref Range   Lactic Acid, Venous 2.69 (HH) 0.5 - 1.9 mmol/L   Comment NOTIFIED PHYSICIAN   TSH     Status: Abnormal   Collection Time: 04/15/2016  7:33 AM  Result Value Ref Range   TSH 0.098 (L) 0.350 - 4.500 uIU/mL    Comment: Performed by a 3rd Generation assay with a functional sensitivity of <=0.01 uIU/mL.  T4, free     Status: Abnormal   Collection Time: 04/24/2016  7:33 AM  Result Value Ref Range   Free T4 0.59 (L) 0.61 - 1.12 ng/dL    Comment: (NOTE) Biotin ingestion may interfere with free T4 tests. If the results are inconsistent with the TSH level, previous test results, or the clinical presentation, then consider biotin interference. If needed, order repeat testing after stopping biotin.   CBC     Status: Abnormal   Collection Time: 04/08/2016  8:00 AM  Result Value Ref Range   WBC 1.3 (LL) 4.0 - 10.5 K/uL    Comment: CRITICAL RESULT CALLED TO, READ  BACK BY AND VERIFIED WITH: B. VIVERITO RN [id number] GREEN R    RBC 3.27 (L) 4.22 - 5.81 MIL/uL   Hemoglobin 9.6 (L) 13.0 - 17.0 g/dL   HCT 29.8 (L) 39.0 - 52.0 %   MCV 91.1 78.0 - 100.0 fL   MCH 29.4 26.0 - 34.0 pg   MCHC 32.2 30.0 - 36.0 g/dL   RDW 19.2 (H) 11.5 - 15.5 %   Platelets 73 (L) 150 - 400 K/uL    Comment: REPEATED TO VERIFY CONSISTENT WITH PREVIOUS RESULT   I-Stat CG4 Lactic Acid, ED  (not at  [facility])     Status: Abnormal   Collection Time: 04/09/2016  8:22 AM  Result Value Ref Range   Lactic Acid, Venous 2.97 (HH) 0.5 - 1.9 mmol/L   Comment NOTIFIED PHYSICIAN   Lactic acid, plasma     Status: Abnormal   Collection Time: 03/27/2016 10:01 AM  Result Value Ref Range   Lactic Acid, Venous 3.4 (HH) 0.5 - 1.9 mmol/L    Comment: CRITICAL RESULT CALLED TO, READ BACK BY AND VERIFIED WITH: STEPHANIE VIVERITO,RN AT 1055 04/07/2016 BY ZBEECH.   MRSA PCR Screening     Status: None   Collection Time: 04/23/2016 10:18 AM  Result Value Ref Range   MRSA by PCR NEGATIVE NEGATIVE    Comment:        The GeneXpert MRSA Assay (FDA approved for NASAL specimens only), is one component of a comprehensive MRSA colonization surveillance program. It is not intended to diagnose MRSA infection nor to guide or monitor treatment for MRSA infections.   Lactic acid, plasma     Status: Abnormal   Collection Time: 04/17/2016 12:43 PM  Result Value Ref Range   Lactic Acid, Venous 2.4 (HH) 0.5 - 1.9 mmol/L    Comment: CRITICAL RESULT CALLED TO, READ BACK BY AND VERIFIED WITH: S.VIVERITO,RN [id number] 03/29/2016 CLARK,S   Glucose, capillary     Status: None   Collection Time: 04/12/2016  1:46 PM  Result Value Ref Range   Glucose-Capillary 74 65 - 99 mg/dL  Renal function panel     Status: Abnormal   Collection Time: 04/07/2016  2:39 PM  Result Value Ref Range   Sodium 135 135 - 145 mmol/L   Potassium 4.2 3.5 - 5.1 mmol/L   Chloride 95 (L) 101 - 111 mmol/L   CO2 25 22 - 32 mmol/L   Glucose, Bld 74 65 - 99  mg/dL   BUN 71 (H) 6 - 20 mg/dL   Creatinine, Ser 5.58 (H) 0.61 - 1.24 mg/dL   Calcium 8.2 (L) 8.9 - 10.3 mg/dL   Phosphorus 3.5 2.5 - 4.6 mg/dL   Albumin 2.1 (L) 3.5 - 5.0 g/dL   GFR calc non Af Amer 10 (L) >60 mL/min   GFR calc Af Amer 12 (L) >60 mL/min    Comment: (NOTE) The eGFR has been calculated using the CKD  EPI equation. This calculation has not been validated in all clinical situations. eGFR's persistently <60 mL/min signify possible Chronic Kidney Disease.    Anion gap 15 5 - 15  Glucose, capillary     Status: None   Collection Time: 04/13/2016  6:27 PM  Result Value Ref Range   Glucose-Capillary 79 65 - 99 mg/dL   Dg Chest 2 View  Result Date: 04/18/2016 CLINICAL DATA:  60 year old male with fall. History of COPD. Tachycardia. EXAM: CHEST  2 VIEW COMPARISON:  Radiograph dated 04/14/2016 FINDINGS: Right-sided dialysis catheter with tip over central SVC. There is cardiomegaly with vascular and interstitial prominence compatible with vascular congestion. Superimposed pneumonia is not excluded. Clinical correlation is recommended. These findings are relatively similar to prior radiograph. No focal consolidation, pleural effusion, or pneumothorax. No acute osseous pathology. IMPRESSION: Cardiomegaly with mild congestive changes similar to prior radiograph. Superimposed pneumonia is not excluded. Clinical correlation is recommended. Electronically Signed   By: Anner Crete M.D.   On: 03/27/2016 05:46   Ct Head Wo Contrast  Result Date: 04/13/2016 CLINICAL DATA:  Fall. Tachycardia. History of hypertension, diabetes, hepatitis-C. EXAM: CT HEAD WITHOUT CONTRAST CT CERVICAL SPINE WITHOUT CONTRAST TECHNIQUE: Multidetector CT imaging of the head and cervical spine was performed following the standard protocol without intravenous contrast. Multiplanar CT image reconstructions of the cervical spine were also generated. COMPARISON:  None. FINDINGS: CT HEAD FINDINGS BRAIN: The ventricles  and sulci are normal. Mild patchy white matter hypodensities compatible with chronic small vessel ischemic disease. No intraparenchymal hemorrhage, mass effect nor midline shift. No acute large vascular territory infarcts. No abnormal extra-axial fluid collections. Basal cisterns are patent. VASCULAR: Unremarkable. SKULL/SOFT TISSUES: No skull fracture. No significant soft tissue swelling. RIGHT facial soft tissue swelling in subcutaneous fat stranding most compatible with contusion. ORBITS/SINUSES: The included ocular globes and orbital contents are normal.No paranasal sinus air-fluid levels. Mastoid air cells are well aerated. OTHER: Predominately edentulous. CT CERVICAL SPINE FINDINGS ALIGNMENT: Maintained lordosis. Vertebral bodies in alignment. SKULL BASE AND VERTEBRAE: Cervical vertebral bodies and posterior elements are intact. Intervertebral disc heights preserved. No destructive bony lesions. C1-2 articulation maintained. Mild RIGHT mid cervical facet arthropathy. SOFT TISSUES AND SPINAL CANAL: Nonacute. Status post RIGHT thyroidectomy. Heterogeneous LEFT thyroid lobe, 19 mm hypodense LEFT thyroid nodule. DISC LEVELS: No significant osseous canal stenosis. Mild RIGHT C3-4 neural foraminal narrowing. UPPER CHEST: Lung apices are clear. OTHER: Tunneled dialysis catheter via RIGHT internal jugular venous approach. IMPRESSION: CT HEAD: RIGHT facial contusion. No acute intracranial process. Mild chronic small vessel ischemic disease. CT CERVICAL SPINE: No acute fracture or malalignment. **An incidental finding of potential clinical significance has been found. 19 mm LEFT thyroid nodule for which follow up thyroid sonogram is recommended on a nonemergent basis. Status post RIGHT thyroidectomy.** Electronically Signed   By: Elon Alas M.D.   On: 04/15/2016 05:31   Ct Cervical Spine Wo Contrast  Result Date: 04/19/2016 CLINICAL DATA:  Fall. Tachycardia. History of hypertension, diabetes, hepatitis-C.  EXAM: CT HEAD WITHOUT CONTRAST CT CERVICAL SPINE WITHOUT CONTRAST TECHNIQUE: Multidetector CT imaging of the head and cervical spine was performed following the standard protocol without intravenous contrast. Multiplanar CT image reconstructions of the cervical spine were also generated. COMPARISON:  None. FINDINGS: CT HEAD FINDINGS BRAIN: The ventricles and sulci are normal. Mild patchy white matter hypodensities compatible with chronic small vessel ischemic disease. No intraparenchymal hemorrhage, mass effect nor midline shift. No acute large vascular territory infarcts. No abnormal extra-axial fluid collections. Basal cisterns are patent.  VASCULAR: Unremarkable. SKULL/SOFT TISSUES: No skull fracture. No significant soft tissue swelling. RIGHT facial soft tissue swelling in subcutaneous fat stranding most compatible with contusion. ORBITS/SINUSES: The included ocular globes and orbital contents are normal.No paranasal sinus air-fluid levels. Mastoid air cells are well aerated. OTHER: Predominately edentulous. CT CERVICAL SPINE FINDINGS ALIGNMENT: Maintained lordosis. Vertebral bodies in alignment. SKULL BASE AND VERTEBRAE: Cervical vertebral bodies and posterior elements are intact. Intervertebral disc heights preserved. No destructive bony lesions. C1-2 articulation maintained. Mild RIGHT mid cervical facet arthropathy. SOFT TISSUES AND SPINAL CANAL: Nonacute. Status post RIGHT thyroidectomy. Heterogeneous LEFT thyroid lobe, 19 mm hypodense LEFT thyroid nodule. DISC LEVELS: No significant osseous canal stenosis. Mild RIGHT C3-4 neural foraminal narrowing. UPPER CHEST: Lung apices are clear. OTHER: Tunneled dialysis catheter via RIGHT internal jugular venous approach. IMPRESSION: CT HEAD: RIGHT facial contusion. No acute intracranial process. Mild chronic small vessel ischemic disease. CT CERVICAL SPINE: No acute fracture or malalignment. **An incidental finding of potential clinical significance has been found.  19 mm LEFT thyroid nodule for which follow up thyroid sonogram is recommended on a nonemergent basis. Status post RIGHT thyroidectomy.** Electronically Signed   By: Elon Alas M.D.   On: 04/19/2016 05:31   Mr Brain Wo Contrast  Result Date: 04/03/2016 CLINICAL DATA:  60 year old diabetic hypertensive male post fall. Tachycardia. Subsequent encounter. EXAM: MRI HEAD WITHOUT CONTRAST TECHNIQUE: Multiplanar, multiecho pulse sequences of the brain and surrounding structures were obtained without intravenous contrast. COMPARISON:  04/11/2016 CT. FINDINGS: Patient was not able to complete exam. Six sequences were obtained are motion degraded. Brain: Tiny right frontal lobe acute infarct. No intracranial hemorrhage. Mild chronic microvascular changes. Moderate global atrophy without hydrocephalus. No intracranial mass lesion noted on this unenhanced exam. Vascular: Evaluation limited. Major intracranial vascular structures appear to be patent. Skull and upper cervical spine: Evaluation limited. Diffusion-weighted abnormality upper cervical spine/ skullbase of indeterminate etiology. Sinuses/Orbits: Limited evaluation without gross abnormality. Other: As above. IMPRESSION: Patient was not able to complete exam. Six sequences were obtained are motion degraded. Tiny right frontal lobe acute infarct. No intracranial hemorrhage. Mild chronic microvascular changes. Moderate global atrophy without hydrocephalus. Diffusion-weighted abnormality upper cervical spine/ skullbase of indeterminate etiology. Electronically Signed   By: Genia Del M.D.   On: 04/17/2016 14:34    Assessment: 60 y.o. male admitted with recurrent falls and slurred speech as well as supratherapeutic INR, with MRI abnormality with a small acute right cortical ischemic infarction.  Stroke Risk Factors - atrial fibrillation, hyperlipidemia and hypertension, diabetes mellitus  Plan: 1. HgbA1c, fasting lipid panel 2. MRA  of the brain  without contrast 3. PT consult, OT consult, Speech consult 4. Echocardiogram 5. Carotid dopplers 6. Prophylactic therapy-Anticoagulation: Coumadin management per Pharmacy 7. Risk factor modification 8. Telemetry monitoring  C.R. Nicole Kindred, MD [facility] [phone]  04/19/2016, 9:43 PM

## 2016-04-16 NOTE — ED Notes (Signed)
Attempted to call report x 3. 

## 2016-04-16 NOTE — Consult Note (Signed)
[facility] Renal Consultation Note  Indication for Consultation:  Management of ESRD/hemodialysis; anemia, hypertension/volume and secondary hyperparathyroidism  HPI: Johnny Sims is a 60 y.o. male with ESRD thought sec. DM/HTN( HD start [facility], TTS HD [place]), with large fluid gains as OP with Severe anxiety issues/bipolar /chronic pain/mild systolic dysfunction EF 25-42% 03/2016/Copd /tob use / Chronic AFib on coumadin admitted with AMS /Slurred speech/Tachycardia / elavated INR 8.72 (noted yest In ER seen sp fall  ^ INR ER Team instructed to hold coumadin  For 3 days and reck  In 3 days") CXR showing CHF/ ? PNA  Per admit team with Iv antibiotics started  In ER. Currently in 4 east cos of bilat feet swelling ,is poor historian with somewhat slurring speech but aware at [facility] and today is Saturday his HD day.  He tells me he lives alone . He has not missed any  Hemodialysis as Out pt but has large volume gains between HD txs .    Past Medical History:  Diagnosis Date  . Anxiety   . Asthma   . Bipolar 1 disorder ([place])   . Chronic pain   . CKD (chronic kidney disease), stage II   . COPD (chronic obstructive pulmonary disease) ([place])   . Diabetes mellitus   . Fibromyalgia   . GERD (gastroesophageal reflux disease)   . Hepatitis C   . Hepatitis C   . Hyperlipidemia   . Hypertension   . Hypothyroidism   . Melanoma ([place])    face, shoulder arm  . OA (osteoarthritis)   . PAF (paroxysmal atrial fibrillation) ([place])   . Pancreatitis, acute 2015  . RA (rheumatoid arthritis) ([place])   . Venous stasis     Past Surgical History:  Procedure Laterality Date  . AMPUTATION Left 11/11/2014   Procedure: FOOT, SECOND Felder AMPUTATION;  Surgeon: Newt Minion, MD;  Location: [place];  Service: Orthopedics;  Laterality: Left;  . BASCILIC VEIN TRANSPOSITION Left 03/29/2016   Procedure: LEFT FIRST STAGE BASILIC VEIN TRANSPOSITION;  Surgeon: Waynetta Sandy, MD;   Location: [place];  Service: Vascular;  Laterality: Left;  . CARDIOVERSION  02/03/2015  . CARDIOVERSION N/A 02/03/2015   Procedure: CARDIOVERSION;  Surgeon: Thayer Headings, MD;  Location: [place];  Service: Cardiovascular;  Laterality: N/A;  . FRACTURE SURGERY Left    arm  . HERNIA REPAIR     umbicial  . INGUINAL HERNIA REPAIR Bilateral   . INSERTION OF DIALYSIS CATHETER Right 03/29/2016   Procedure: INSERTION OF RIGHT Internal Jugular DIALYSIS CATHETER;  Surgeon: Waynetta Sandy, MD;  Location: [place];  Service: Vascular;  Laterality: Right;  . KNEE SURGERY     /H&P 02/20/2010  . SKIN CANCER EXCISION     melonoma  . SUBMANDIBULAR GLAND EXCISION Left 12/25/2013   w/sialolithotomy  . SUBMANDIBULAR GLAND EXCISION Left 12/25/2013   Procedure: SUBMANDIBULAR GLAND STONE AND CHRONIC SIALOLITHIASIS EXCISION;  Surgeon: Melida Quitter, MD;  Location: [place];  Service: ENT;  Laterality: Left;  . THYROIDECTOMY    . TONSILLECTOMY    . US ECHOCARDIOGRAPHY  02/19/2010   EF 55-60%      Family History  Problem Relation Age of Onset  . Hypertension Mother   . Alzheimer's disease Mother   . Heart disease Father       reports that he has been smoking Cigarettes.  He has a 43.00 pack-year smoking history. He has never used smokeless tobacco. He reports that he  drinks alcohol. He reports that he does not use drugs.   Allergies  Allergen Reactions  . Novolog [Insulin Aspart] Other (See Comments)    UNSPECIFIED REACTION  "I'm just highly allergic"  . Hydrocodone-Acetaminophen Palpitations and Other (See Comments)    Doesn't want to take  . Iodides Other (See Comments)    Patient states he doesn't know what iodine is and doesn't think he is allergic to it despite it being listed with his allergies  . Other Hives and Other (See Comments)    Steroid creams "Corndogs"- Nausea & vomiting   . Sulfacetamide Sodium Hives and Itching  . Tylenol [Acetaminophen] Nausea And Vomiting  . Gabapentin  Swelling    SWELLING REACTION UNSPECIFIED   . Oxycodone-Acetaminophen Nausea And Vomiting  . Antihistamines, Chlorpheniramine-Type Other (See Comments)    unknown  . Hydralazine Rash  . Iodinated Diagnostic Agents Other (See Comments)    Patient states he doesn't know what iodine is and doesn't think he is allergic to it despite it being listed with his allergies  . Latex Rash  . Lidocaine Itching and Other (See Comments)    Patient is uncertain of this allergy  . Pentazocine Other (See Comments)    unknown  . Pheniramine Other (See Comments)    unknown  . Sulfa Antibiotics Itching and Other (See Comments)  . Tape Rash  . Vicodin [Hydrocodone-Acetaminophen] Palpitations and Other (See Comments)    Doesn't want to take    Prior to Admission medications   Medication Sig Start Date End Date Taking? Authorizing Provider  albuterol (PROVENTIL HFA;VENTOLIN HFA) 108 (90 BASE) MCG/ACT inhaler Inhale 2 puffs into the lungs every 4 (four) hours as needed for wheezing or shortness of breath.    Historical Provider, MD  albuterol (PROVENTIL) (2.5 MG/3ML) 0.083% nebulizer solution Take 2.5 mg by nebulization 4 (four) times daily.    Historical Provider, MD  calcium acetate (PHOSLO) 667 MG capsule Take 2 capsules (1,334 mg total) by mouth 3 (three) times daily with meals. 04/02/16   Jule Ser, DO  cholecalciferol (VITAMIN D) 1000 units tablet Take 1,000 Units by mouth daily.    Historical Provider, MD  COUMADIN 10 MG tablet Take as directed by the Coumadin clinic. Patient taking differently: Take 10 mg by mouth See admin instructions. Mon/Tues/Wed/Fri/Sat (in the evening) 01/19/16   Thayer Headings, MD  COUMADIN 7.5 MG tablet Take as directed by Coumadin clinic. Patient taking differently: Take 7.5 mg by mouth. Sun/Thurs (in the evening) 01/19/16   Thayer Headings, MD  Darbepoetin Alfa (ARANESP) 100 MCG/0.5ML SOSY injection Inject 0.5 mLs (100 mcg total) into the vein every Tuesday with  hemodialysis. 04/05/16   Jule Ser, DO  diazepam (VALIUM) 10 MG tablet Take 10 mg by mouth every 4 (four) hours as needed for anxiety.  08/24/15   Historical Provider, MD  hydrALAZINE (APRESOLINE) 50 MG tablet Take 1 tablet (50 mg total) by mouth 3 (three) times daily. 04/02/16   Jule Ser, DO  Insulin Glargine (LANTUS SOLOSTAR) 100 UNIT/ML Solostar Pen Inject 30 Units into the skin daily at 10 pm. 06/25/15   Loleta Chance, MD  levothyroxine (SYNTHROID) 88 MCG tablet Take 1 tablet (88 mcg total) by mouth daily before breakfast. Will send additional refills based off blood level Patient taking differently: Take 88 mcg by mouth daily before breakfast.  03/06/15   Lance Bosch, NP  metoprolol (LOPRESSOR) 100 MG tablet Take 1 tablet (100 mg total) by mouth 2 (two) times daily.  04/02/16   Jule Ser, DO  multivitamin (RENA-VIT) TABS tablet Take 1 tablet by mouth at bedtime. 04/02/16   Jule Ser, DO  nicotine (NICODERM CQ - DOSED IN MG/24 HOURS) 14 mg/24hr patch Place 14 mg onto the skin daily.    Historical Provider, MD  omeprazole (PRILOSEC) 20 MG capsule Take 1 capsule (20 mg total) by mouth daily. Take with [place] at the same time under FASTING condition Patient taking differently: Take 20 mg by mouth daily.  03/06/15   Lance Bosch, NP  oxyCODONE (ROXICODONE) 15 MG immediate release tablet Take 1 tablet (15 mg total) by mouth every 3 (three) hours as needed for pain. 04/02/16   Ledell Noss, MD  psyllium (HYDROCIL/METAMUCIL) 95 % PACK Take 1 packet by mouth daily. Patient not taking: Reported on 03/26/2016 06/25/15   Loleta Chance, MD  simvastatin (ZOCOR) 20 MG tablet Take 1 tablet (20 mg total) by mouth daily at 6 PM. Patient taking differently: Take 10 mg by mouth daily at 6 PM.  03/06/15   Lance Bosch, NP  SPIRIVA HANDIHALER 18 MCG inhalation capsule INHALE CONTENTS OF ONE CAPSULE ONCE A DAY 04/01/15   Thayer Headings, MD  SURE COMFORT PEN NEEDLES 31G X 8 MM MISC USE ONE NIGHTLY 09/07/15    Lance Bosch, NP    PRN:  Results for orders placed or performed during the hospital encounter of 04/24/2016 (from the past 48 hour(s))  Comprehensive metabolic panel     Status: Abnormal   Collection Time: 04/22/2016  4:33 AM  Result Value Ref Range   Sodium 134 (L) 135 - 145 mmol/L   Potassium 3.9 3.5 - 5.1 mmol/L   Chloride 93 (L) 101 - 111 mmol/L   CO2 27 22 - 32 mmol/L   Glucose, Bld 108 (H) 65 - 99 mg/dL   BUN 64 (H) 6 - 20 mg/dL   Creatinine, Ser 5.06 (H) 0.61 - 1.24 mg/dL   Calcium 8.4 (L) 8.9 - 10.3 mg/dL   Total Protein 6.2 (L) 6.5 - 8.1 g/dL   Albumin 2.8 (L) 3.5 - 5.0 g/dL   AST 42 (H) 15 - 41 U/L   ALT 29 17 - 63 U/L   Alkaline Phosphatase 63 38 - 126 U/L   Total Bilirubin 1.1 0.3 - 1.2 mg/dL   GFR calc non Af Amer 11 (L) >60 mL/min   GFR calc Af Amer 13 (L) >60 mL/min    Comment: (NOTE) The eGFR has been calculated using the CKD EPI equation. This calculation has not been validated in all clinical situations. eGFR's persistently <60 mL/min signify possible Chronic Kidney Disease.    Anion gap 14 5 - 15  CBC WITH DIFFERENTIAL     Status: Abnormal   Collection Time: 04/15/2016  4:33 AM  Result Value Ref Range   WBC 2.9 (L) 4.0 - 10.5 K/uL   RBC 3.70 (L) 4.22 - 5.81 MIL/uL   Hemoglobin 10.8 (L) 13.0 - 17.0 g/dL   HCT 33.8 (L) 39.0 - 52.0 %   MCV 91.4 78.0 - 100.0 fL   MCH 29.2 26.0 - 34.0 pg   MCHC 32.0 30.0 - 36.0 g/dL   RDW 19.3 (H) 11.5 - 15.5 %   Platelets 84 (L) 150 - 400 K/uL    Comment: CONSISTENT WITH PREVIOUS RESULT   Neutrophils Relative % 79 %   Lymphocytes Relative 16 %   Monocytes Relative 5 %   Eosinophils Relative 0 %   Basophils Relative 0 %  Neutro Abs 2.3 1.7 - 7.7 K/uL   Lymphs Abs 0.5 (L) 0.7 - 4.0 K/uL   Monocytes Absolute 0.1 0.1 - 1.0 K/uL   Eosinophils Absolute 0.0 0.0 - 0.7 K/uL   Basophils Absolute 0.0 0.0 - 0.1 K/uL   WBC Morphology INCREASED BANDS (>20% BANDS)   Protime-INR     Status: Abnormal   Collection Time: 04/25/2016   4:33 AM  Result Value Ref Range   Prothrombin Time 74.7 (H) 11.4 - 15.2 seconds   INR 8.72 (HH)     Comment: REPEATED TO VERIFY CRITICAL RESULT CALLED TO, READ BACK BY AND VERIFIED WITH: Gardiner Sleeper RN [phone] [id number] SHORT TIFFANY   Ethanol     Status: None   Collection Time: 03/29/2016  4:36 AM  Result Value Ref Range   Alcohol, Ethyl (B) <5 <5 mg/dL    Comment:        LOWEST DETECTABLE LIMIT FOR SERUM ALCOHOL IS 5 mg/dL FOR MEDICAL PURPOSES ONLY   Ammonia     Status: None   Collection Time: 03/30/2016  4:47 AM  Result Value Ref Range   Ammonia 15 9 - 35 umol/L  I-Stat CG4 Lactic Acid, ED  (not at  [facility])     Status: Abnormal   Collection Time: 04/26/2016  5:06 AM  Result Value Ref Range   Lactic Acid, Venous 2.69 (HH) 0.5 - 1.9 mmol/L   Comment NOTIFIED PHYSICIAN   CBC     Status: Abnormal (Preliminary result)   Collection Time: 04/25/2016  8:00 AM  Result Value Ref Range   WBC PENDING 4.0 - 10.5 K/uL   RBC 3.27 (L) 4.22 - 5.81 MIL/uL   Hemoglobin 9.6 (L) 13.0 - 17.0 g/dL   HCT 29.8 (L) 39.0 - 52.0 %   MCV 91.1 78.0 - 100.0 fL   MCH 29.4 26.0 - 34.0 pg   MCHC 32.2 30.0 - 36.0 g/dL   RDW 19.2 (H) 11.5 - 15.5 %   Platelets 73 (L) 150 - 400 K/uL    Comment: REPEATED TO VERIFY CONSISTENT WITH PREVIOUS RESULT   I-Stat CG4 Lactic Acid, ED  (not at  [facility])     Status: Abnormal   Collection Time: 04/14/2016  8:22 AM  Result Value Ref Range   Lactic Acid, Venous 2.97 (HH) 0.5 - 1.9 mmol/L   Comment NOTIFIED PHYSICIAN     ROS: as above  Poor historian   Physical Exam: Vitals:   03/29/2016 0815 04/18/2016 0843  BP: 118/70 (!) 145/82  Pulse: 109 (!) 153  Resp: (!) 8 20  Temp:  97.3 F (36.3 C)     General: alert Obese Wm NAD , Slurring speech somewhat O X3 HEENT: R facial ecchymosis / poor dentation , dry MMM Neck: pos jvd  Heart: Tachy reg , 1/6 sem LSB , no rub  Lungs: scattered fine wheezes . non labored breathing  Abdomen: obese, bs pos / ventral hernia/ R upper /lower mild   tenderness to pal. , no distention  Extremities: massive Bilat lower extrem edema up to knees  With chronic venous changes / L 2nd toe amputation  Skin: scattered erythema spots trunk, face, arms, Left lateral leg weeping clear dc  Neuro: moves all extrem to request / lower extrem  weakness / OX3 Dialysis Access: pos bruit LUA AVF with developing AVF R IJ Perm cath /dried blood at site nontender  Dialysis Orders: Center: [place]   on TTS . EDW 127kg HD Bath 2k, 2ca  Time 4  hr 54mn Heparin 2700. Access RIJ Perm cath /LUA AVF  (inserted 03/29/16)     Calcitriol 0.5 mcg PO /HD      Aranesp 80  Mcg  IV/HD      Venofer 10 mg q hd (start 10/19 >05/05/16)  Other op labs=  hgb 9.0  04/06/16 , cq 8.4  Phos 8.7   pth 516  Assessment/Plan 1. Volume overload - hd today  Attempt 5 l uf  2. ESRD -  HD TTS  continue on schedule  may need extra hd for volume uf  AMS- multiple etiologies -  HO bipolar/ anxiety / PNA/ ^ vol stress / admit team wu 3. ^^ INR On Coumadin - per admit  4. Ho Afib-RVR tachycardia - per admit 5. Hypertension/volume  -  Current bp stable 152/80 in room /as above ^ vol .with mild systolic CHF=needs better compliance wit volumes intake between hd  6. Anemia of ESRD - ESA and  fe load on hd  7. Metabolic bone disease -  Binder ac and vit d on hd  Fu ca / phos labs  8. COPD 9. Bilat lower leg cellulitic /chronic veinous changes - per admit  10. Bipolar / anxiety  DErnest Haber PA-C [id number]/21/2017, 9:48 AM   Pt seen, examined, agree w assess/plan as above with additions as indicated. ESRD pt recently started dialysis, has problems with chronic LE edema / vol overload.  Today CXR benign and lungs clear , most of vol excess is R sided/ 3rd spaced LE edema.  Has lots of pain in legs, and some redness bilat, stasis dermatitis vs cellulitis.  Will d/w primary team.  HD today.  RKelly SplinterMD CNewell Rubbermaidpager [phone]   cell  [phone]/21/2017, 2:09 PM         \

## 2016-04-16 NOTE — ED Notes (Signed)
Attempted to call report x 4. 

## 2016-04-16 NOTE — ED Provider Notes (Signed)
[facility] DEPT Provider Note   CSN: [id number] Arrival date & time: 04/21/2016  [id number]     History   Chief Complaint Chief Complaint  Patient presents with  . Fall    HPI Johnny Sims is a 60 y.o. male with a hx of anxiety, asthma, chronic pain, COPD, CKD, a-fib, Hep C, pancreatitis, venous stasis presents to the Emergency Department complaining of gradual, persistent, progressively worsening Generalized weakness and recurrent falls. Patient also reporting worsening swelling in his bilateral lower legs. He is lethargic on arrival. Ecchymosis to the right side of the face. Patient reports this is from a fall several days ago. He is anticoagulated for his A. Fib.  No aggravating or alleviating factors to patient's knowledge. He denies fever, chills, nausea, vomiting. He does report increased cough.  HPI  Past Medical History:  Diagnosis Date  . Anxiety   . Asthma   . Bipolar 1 disorder ([place])   . Chronic pain   . CKD (chronic kidney disease), stage II   . COPD (chronic obstructive pulmonary disease) ([place])   . Diabetes mellitus   . Fibromyalgia   . GERD (gastroesophageal reflux disease)   . Hepatitis C   . Hepatitis C   . Hyperlipidemia   . Hypertension   . Hypothyroidism   . Melanoma ([place])    face, shoulder arm  . OA (osteoarthritis)   . PAF (paroxysmal atrial fibrillation) ([place])   . Pancreatitis, acute 2015  . RA (rheumatoid arthritis) ([place])   . Venous stasis     Patient Active Problem List   Diagnosis Date Noted  . Weakness 04/10/2016  . Congestive heart failure ([place])   . ESRD on dialysis ([place])   . Hypokalemia 03/26/2016  . Encounter for therapeutic drug monitoring 09/07/2015  . Pyrexia   . Upper respiratory tract infection   . Type 2 diabetes mellitus with diabetic polyneuropathy, with long-term current use of insulin ([place])   . Thyroid activity decreased   . Atrial fibrillation status post cardioversion ([place]) 02/03/2015  . GERD (gastroesophageal reflux  disease) 11/10/2014  . Tobacco use disorder 08/28/2014  . Hyperlipidemia 07/16/2012  . Essential hypertension 07/16/2012  . COPD exacerbation ([place]) 07/16/2012    Past Surgical History:  Procedure Laterality Date  . AMPUTATION Left 11/11/2014   Procedure: FOOT, SECOND [place] AMPUTATION;  Surgeon: Newt Minion, MD;  Location: [facility];  Service: Orthopedics;  Laterality: Left;  . BASCILIC VEIN TRANSPOSITION Left 03/29/2016   Procedure: LEFT FIRST STAGE BASILIC VEIN TRANSPOSITION;  Surgeon: Waynetta Sandy, MD;  Location: [facility];  Service: Vascular;  Laterality: Left;  . CARDIOVERSION  02/03/2015  . CARDIOVERSION N/A 02/03/2015   Procedure: CARDIOVERSION;  Surgeon: Thayer Headings, MD;  Location: [facility];  Service: Cardiovascular;  Laterality: N/A;  . FRACTURE SURGERY Left    arm  . HERNIA REPAIR     umbicial  . INGUINAL HERNIA REPAIR Bilateral   . INSERTION OF DIALYSIS CATHETER Right 03/29/2016   Procedure: INSERTION OF RIGHT Internal Jugular DIALYSIS CATHETER;  Surgeon: Waynetta Sandy, MD;  Location: [facility];  Service: Vascular;  Laterality: Right;  . KNEE SURGERY     /H&P 02/20/2010  . SKIN CANCER EXCISION     melonoma  . SUBMANDIBULAR GLAND EXCISION Left 12/25/2013   w/sialolithotomy  . SUBMANDIBULAR GLAND EXCISION Left 12/25/2013   Procedure: SUBMANDIBULAR GLAND STONE AND CHRONIC SIALOLITHIASIS EXCISION;  Surgeon: Melida Quitter, MD;  Location: [facility];  Service: ENT;  Laterality: Left;  . THYROIDECTOMY    .  TONSILLECTOMY    . US ECHOCARDIOGRAPHY  02/19/2010   EF 55-60%       Home Medications    Prior to Admission medications   Medication Sig Start Date End Date Taking? Authorizing Provider  albuterol (PROVENTIL HFA;VENTOLIN HFA) 108 (90 BASE) MCG/ACT inhaler Inhale 2 puffs into the lungs every 4 (four) hours as needed for wheezing or shortness of breath.    Historical Provider, MD  albuterol (PROVENTIL) (2.5 MG/3ML) 0.083% nebulizer solution Take 2.5 mg by  nebulization 4 (four) times daily.    Historical Provider, MD  calcium acetate (PHOSLO) 667 MG capsule Take 2 capsules (1,334 mg total) by mouth 3 (three) times daily with meals. 04/02/16   Jule Ser, DO  cholecalciferol (VITAMIN D) 1000 units tablet Take 1,000 Units by mouth daily.    Historical Provider, MD  COUMADIN 10 MG tablet Take as directed by the Coumadin clinic. Patient taking differently: Take 10 mg by mouth See admin instructions. Mon/Tues/Wed/Fri/Sat (in the evening) 01/19/16   Thayer Headings, MD  COUMADIN 7.5 MG tablet Take as directed by Coumadin clinic. Patient taking differently: Take 7.5 mg by mouth. Sun/Thurs (in the evening) 01/19/16   Thayer Headings, MD  Darbepoetin Alfa (ARANESP) 100 MCG/0.5ML SOSY injection Inject 0.5 mLs (100 mcg total) into the vein every Tuesday with hemodialysis. 04/05/16   Jule Ser, DO  diazepam (VALIUM) 10 MG tablet Take 10 mg by mouth every 4 (four) hours as needed for anxiety.  08/24/15   Historical Provider, MD  hydrALAZINE (APRESOLINE) 50 MG tablet Take 1 tablet (50 mg total) by mouth 3 (three) times daily. 04/02/16   Jule Ser, DO  Insulin Glargine (LANTUS SOLOSTAR) 100 UNIT/ML Solostar Pen Inject 30 Units into the skin daily at 10 pm. 06/25/15   Loleta Chance, MD  levothyroxine (SYNTHROID) 88 MCG tablet Take 1 tablet (88 mcg total) by mouth daily before breakfast. Will send additional refills based off blood level Patient taking differently: Take 88 mcg by mouth daily before breakfast.  03/06/15   Lance Bosch, NP  metoprolol (LOPRESSOR) 100 MG tablet Take 1 tablet (100 mg total) by mouth 2 (two) times daily. 04/02/16   Jule Ser, DO  multivitamin (RENA-VIT) TABS tablet Take 1 tablet by mouth at bedtime. 04/02/16   Jule Ser, DO  nicotine (NICODERM CQ - DOSED IN MG/24 HOURS) 14 mg/24hr patch Place 14 mg onto the skin daily.    Historical Provider, MD  omeprazole (PRILOSEC) 20 MG capsule Take 1 capsule (20 mg total) by mouth daily.  Take with [place]oni at the same time under FASTING condition Patient taking differently: Take 20 mg by mouth daily.  03/06/15   Lance Bosch, NP  oxyCODONE (ROXICODONE) 15 MG immediate release tablet Take 1 tablet (15 mg total) by mouth every 3 (three) hours as needed for pain. 04/02/16   Ledell Noss, MD  psyllium (HYDROCIL/METAMUCIL) 95 % PACK Take 1 packet by mouth daily. Patient not taking: Reported on 03/26/2016 06/25/15   Loleta Chance, MD  simvastatin (ZOCOR) 20 MG tablet Take 1 tablet (20 mg total) by mouth daily at 6 PM. Patient taking differently: Take 10 mg by mouth daily at 6 PM.  03/06/15   Lance Bosch, NP  SPIRIVA HANDIHALER 18 MCG inhalation capsule INHALE CONTENTS OF ONE CAPSULE ONCE A DAY 04/01/15   Thayer Headings, MD  SURE COMFORT PEN NEEDLES 31G X 8 MM MISC USE ONE NIGHTLY 09/07/15   Lance Bosch, NP  Family History Family History  Problem Relation Age of Onset  . Hypertension Mother   . Alzheimer's disease Mother   . Heart disease Father     Social History Social History  Substance Use Topics  . Smoking status: Current Every Day Smoker    Packs/day: 1.00    Years: 43.00    Types: Cigarettes  . Smokeless tobacco: Never Used  . Alcohol use Yes     Comment: rare     Allergies   Novolog [insulin aspart]; Hydrocodone-acetaminophen; Iodides; Other; Sulfacetamide sodium; Tylenol [acetaminophen]; Gabapentin; Oxycodone-acetaminophen; Antihistamines, chlorpheniramine-type; Hydralazine; Iodinated diagnostic agents; Latex; Lidocaine; Pentazocine; Pheniramine; Sulfa antibiotics; Tape; and Vicodin [hydrocodone-acetaminophen]   Review of Systems Review of Systems  Neurological: Positive for weakness ( Generalized).       Recurrent falls  All other systems reviewed and are negative.    Physical Exam Updated Vital Signs BP 128/69   Pulse 109   Temp 97.4 F (36.3 C) (Oral)   Resp 14   SpO2 100%   Physical Exam  Constitutional: He appears well-developed and  well-nourished. He appears lethargic. No distress.  Awake, alert, nontoxic appearance  HENT:  Head: Normocephalic and atraumatic.  Mouth/Throat: Oropharynx is clear and moist. No oropharyngeal exudate.  Large contusion to the right side of the face  Eyes: Conjunctivae are normal. No scleral icterus.  Neck: Normal range of motion. Neck supple.  Cardiovascular: Regular rhythm and intact distal pulses.  Tachycardia present.   Tachycardia  Pulmonary/Chest: Effort normal. No respiratory distress. He has no wheezes. He has rhonchi ( Throughout).  Equal chest expansion Rhonchorous breath sounds in the bilateral bases Dialysis catheter in the right upper chest  Abdominal: Soft. Bowel sounds are normal. He exhibits no mass. There is no tenderness. There is no rebound and no guarding.  Musculoskeletal: Normal range of motion. He exhibits no edema.  Dialysis fistula in the left arm with palpable thrill  Neurological: He appears lethargic.  Speech is clear and goal oriented Moves extremities without ataxia  Skin: Skin is warm and dry. He is not diaphoretic.  Significant venous stasis changes to the bilateral lower legs extending past the knees with slight warmth; multiple areas weeping serous fluid  Psychiatric: He has a normal mood and affect.  Nursing note and vitals reviewed.    ED Treatments / Results  Labs (all labs ordered are listed, but only abnormal results are displayed) Labs Reviewed  COMPREHENSIVE METABOLIC PANEL - Abnormal; Notable for the following:       Result Value   Sodium 134 (*)    Chloride 93 (*)    Glucose, Bld 108 (*)    BUN 64 (*)    Creatinine, Ser 5.06 (*)    Calcium 8.4 (*)    Total Protein 6.2 (*)    Albumin 2.8 (*)    AST 42 (*)    GFR calc non Af Amer 11 (*)    GFR calc Af Amer 13 (*)    All other components within normal limits  CBC WITH DIFFERENTIAL/PLATELET - Abnormal; Notable for the following:    WBC 2.9 (*)    RBC 3.70 (*)    Hemoglobin 10.8 (*)     HCT 33.8 (*)    RDW 19.3 (*)    Platelets 84 (*)    Lymphs Abs 0.5 (*)    All other components within normal limits  PROTIME-INR - Abnormal; Notable for the following:    Prothrombin Time 74.7 (*)    INR 8.72 (*)  All other components within normal limits  I-STAT CG4 LACTIC ACID, ED - Abnormal; Notable for the following:    Lactic Acid, Venous 2.69 (*)    All other components within normal limits  CULTURE, BLOOD (ROUTINE X 2)  CULTURE, BLOOD (ROUTINE X 2)  URINE CULTURE  ETHANOL  AMMONIA  URINALYSIS, ROUTINE W REFLEX MICROSCOPIC (NOT AT [facility])  RAPID URINE DRUG SCREEN, HOSP PERFORMED  TSH  T4, FREE    EKG  EKG Interpretation  Date/Time:  Saturday April 16 2016 04:30:28 EDT Ventricular Rate:  119 PR Interval:    QRS Duration: 96 QT Interval:  294 QTC Calculation: 414 R Axis:   32 Text Interpretation:  Atrial fibrillation LVH with secondary repolarization abnormality No significant change since last tracing Confirmed by Myrtis Ser [id number]) on 04/26/2016 4:32:53 AM       Radiology Dg Chest 2 View  Result Date: 04/06/2016 CLINICAL DATA:  60 year old male with fall. History of COPD. Tachycardia. EXAM: CHEST  2 VIEW COMPARISON:  Radiograph dated 04/14/2016 FINDINGS: Right-sided dialysis catheter with tip over central SVC. There is cardiomegaly with vascular and interstitial prominence compatible with vascular congestion. Superimposed pneumonia is not excluded. Clinical correlation is recommended. These findings are relatively similar to prior radiograph. No focal consolidation, pleural effusion, or pneumothorax. No acute osseous pathology. IMPRESSION: Cardiomegaly with mild congestive changes similar to prior radiograph. Superimposed pneumonia is not excluded. Clinical correlation is recommended. Electronically Signed   By: Anner Crete M.D.   On: 04/11/2016 05:46   Ct Head Wo Contrast  Result Date: 04/12/2016 CLINICAL DATA:  Fall. Tachycardia. History of  hypertension, diabetes, hepatitis-C. EXAM: CT HEAD WITHOUT CONTRAST CT CERVICAL SPINE WITHOUT CONTRAST TECHNIQUE: Multidetector CT imaging of the head and cervical spine was performed following the standard protocol without intravenous contrast. Multiplanar CT image reconstructions of the cervical spine were also generated. COMPARISON:  None. FINDINGS: CT HEAD FINDINGS BRAIN: The ventricles and sulci are normal. Mild patchy white matter hypodensities compatible with chronic small vessel ischemic disease. No intraparenchymal hemorrhage, mass effect nor midline shift. No acute large vascular territory infarcts. No abnormal extra-axial fluid collections. Basal cisterns are patent. VASCULAR: Unremarkable. SKULL/SOFT TISSUES: No skull fracture. No significant soft tissue swelling. RIGHT facial soft tissue swelling in subcutaneous fat stranding most compatible with contusion. ORBITS/SINUSES: The included ocular globes and orbital contents are normal.No paranasal sinus air-fluid levels. Mastoid air cells are well aerated. OTHER: Predominately edentulous. CT CERVICAL SPINE FINDINGS ALIGNMENT: Maintained lordosis. Vertebral bodies in alignment. SKULL BASE AND VERTEBRAE: Cervical vertebral bodies and posterior elements are intact. Intervertebral disc heights preserved. No destructive bony lesions. C1-2 articulation maintained. Mild RIGHT mid cervical facet arthropathy. SOFT TISSUES AND SPINAL CANAL: Nonacute. Status post RIGHT thyroidectomy. Heterogeneous LEFT thyroid lobe, 19 mm hypodense LEFT thyroid nodule. DISC LEVELS: No significant osseous canal stenosis. Mild RIGHT C3-4 neural foraminal narrowing. UPPER CHEST: Lung apices are clear. OTHER: Tunneled dialysis catheter via RIGHT internal jugular venous approach. IMPRESSION: CT HEAD: RIGHT facial contusion. No acute intracranial process. Mild chronic small vessel ischemic disease. CT CERVICAL SPINE: No acute fracture or malalignment. **An incidental finding of potential  clinical significance has been found. 19 mm LEFT thyroid nodule for which follow up thyroid sonogram is recommended on a nonemergent basis. Status post RIGHT thyroidectomy.** Electronically Signed   By: Elon Alas M.D.   On: 04/02/2016 05:31   Ct Cervical Spine Wo Contrast  Result Date: 04/12/2016 CLINICAL DATA:  Fall. Tachycardia. History of hypertension, diabetes, hepatitis-C. EXAM: CT  HEAD WITHOUT CONTRAST CT CERVICAL SPINE WITHOUT CONTRAST TECHNIQUE: Multidetector CT imaging of the head and cervical spine was performed following the standard protocol without intravenous contrast. Multiplanar CT image reconstructions of the cervical spine were also generated. COMPARISON:  None. FINDINGS: CT HEAD FINDINGS BRAIN: The ventricles and sulci are normal. Mild patchy white matter hypodensities compatible with chronic small vessel ischemic disease. No intraparenchymal hemorrhage, mass effect nor midline shift. No acute large vascular territory infarcts. No abnormal extra-axial fluid collections. Basal cisterns are patent. VASCULAR: Unremarkable. SKULL/SOFT TISSUES: No skull fracture. No significant soft tissue swelling. RIGHT facial soft tissue swelling in subcutaneous fat stranding most compatible with contusion. ORBITS/SINUSES: The included ocular globes and orbital contents are normal.No paranasal sinus air-fluid levels. Mastoid air cells are well aerated. OTHER: Predominately edentulous. CT CERVICAL SPINE FINDINGS ALIGNMENT: Maintained lordosis. Vertebral bodies in alignment. SKULL BASE AND VERTEBRAE: Cervical vertebral bodies and posterior elements are intact. Intervertebral disc heights preserved. No destructive bony lesions. C1-2 articulation maintained. Mild RIGHT mid cervical facet arthropathy. SOFT TISSUES AND SPINAL CANAL: Nonacute. Status post RIGHT thyroidectomy. Heterogeneous LEFT thyroid lobe, 19 mm hypodense LEFT thyroid nodule. DISC LEVELS: No significant osseous canal stenosis. Mild RIGHT  C3-4 neural foraminal narrowing. UPPER CHEST: Lung apices are clear. OTHER: Tunneled dialysis catheter via RIGHT internal jugular venous approach. IMPRESSION: CT HEAD: RIGHT facial contusion. No acute intracranial process. Mild chronic small vessel ischemic disease. CT CERVICAL SPINE: No acute fracture or malalignment. **An incidental finding of potential clinical significance has been found. 19 mm LEFT thyroid nodule for which follow up thyroid sonogram is recommended on a nonemergent basis. Status post RIGHT thyroidectomy.** Electronically Signed   By: Elon Alas M.D.   On: 03/29/2016 05:31    Procedures Procedures (including critical care time)  Medications Ordered in ED Medications  ceFEPIme (MAXIPIME) 1 g in dextrose 5 % 50 mL IVPB (not administered)  vancomycin (VANCOCIN) 2,000 mg in sodium chloride 0.9 % 500 mL IVPB (not administered)  sodium chloride 0.9 % bolus 500 mL (0 mLs Intravenous Stopped 04/20/2016 0540)     Initial Impression / Assessment and Plan / ED Course  I have reviewed the triage vital signs and the nursing notes.  Pertinent labs & imaging results that were available during my care of the patient were reviewed by me and considered in my medical decision making (see chart for details).  Clinical Course  Value Comment By Time  EKG 12-Lead A-fib, rate of 119.  Hx of same Abigail Butts, PA-C 10/21 [id number]  INR: (!!) 8.72 Elevated Fryda Molenda, PA-C 10/21 0648  WBC: (!) 2.9 Leukopenia down from leukocytosis 4 days ago. Jarrett Soho Kathyleen Radice, PA-C 10/21 [phone]  Lactic Acid, Venous: (!!) 2.69 Elevated lactic acid and Abigail Butts, PA-C 10/21 [id number]  Ammonia: 15 Normal Abigail Butts, PA-C 10/21 [id number]  DG Chest 2 View Cardiomegaly with congestive changes. Question possible pneumonia. Jarrett Soho Raylei Losurdo, PA-C 10/21 [phone]  CT Head Wo Contrast CT of head and neck without acute abnormality.  Incidental finding of thyroid nodule. TSH and T4 pending. Abigail Butts, PA-C 10/21 [id number]   Discussed with IMTS who will admit to stepdown Abigail Butts, PA-C 10/21 [id number]   Patient presents somewhat altered with complaints of weakness and recurrent falls. Elevated lactic acid. Leukopenia as compared to leukocytosis 5 days ago. Serum creatinine baseline. Significant elevation in PT INR; pt reports he recently stopped his coumadin, but cannot provide more info.  Chest x-Glenn with cardiomegaly and vascular congestion versus pneumonia. He will need admission  for further workup.  Antibiotics started for HCAP as pt was recently admitted.  Patient is unable to ambulate due to his severe weakness.  Patient was not given 30 mL/kg bolus as he is clearly already volume overloaded and is not in septic shock.  He reports concern about cellulitis in his bilateral lower legs.  Physical exam more consistent with venous stasis changes. Legs are warm but not hot to touch. Multiple areas of weeping.    Final Clinical Impressions(s) / ED Diagnoses   Final diagnoses:  Fall, initial encounter  Contusion of face, initial encounter  Leukopenia, unspecified type    New Prescriptions New Prescriptions   No medications on file     Abigail Butts, PA-C 04/14/2016 [place], DO 04/12/2016 [phone]

## 2016-04-16 NOTE — ED Notes (Signed)
Attempted to call report to 4E. 

## 2016-04-17 ENCOUNTER — Inpatient Hospital Stay (HOSPITAL_COMMUNITY): Payer: Medicaid Other

## 2016-04-17 DIAGNOSIS — S0083XA Contusion of other part of head, initial encounter: Secondary | ICD-10-CM

## 2016-04-17 DIAGNOSIS — Z79891 Long term (current) use of opiate analgesic: Secondary | ICD-10-CM

## 2016-04-17 DIAGNOSIS — F419 Anxiety disorder, unspecified: Secondary | ICD-10-CM

## 2016-04-17 DIAGNOSIS — I639 Cerebral infarction, unspecified: Secondary | ICD-10-CM

## 2016-04-17 DIAGNOSIS — I63411 Cerebral infarction due to embolism of right middle cerebral artery: Secondary | ICD-10-CM

## 2016-04-17 DIAGNOSIS — G8929 Other chronic pain: Secondary | ICD-10-CM

## 2016-04-17 DIAGNOSIS — N186 End stage renal disease: Secondary | ICD-10-CM

## 2016-04-17 DIAGNOSIS — R7881 Bacteremia: Secondary | ICD-10-CM

## 2016-04-17 LAB — RENAL FUNCTION PANEL
ALBUMIN: 2.2 g/dL — AB (ref 3.5–5.0)
Anion gap: 15 (ref 5–15)
BUN: 39 mg/dL — AB (ref 6–20)
CO2: 23 mmol/L (ref 22–32)
Calcium: 7.6 mg/dL — ABNORMAL LOW (ref 8.9–10.3)
Chloride: 96 mmol/L — ABNORMAL LOW (ref 101–111)
Creatinine, Ser: 4 mg/dL — ABNORMAL HIGH (ref 0.61–1.24)
GFR calc Af Amer: 17 mL/min — ABNORMAL LOW (ref >60)
GFR calc non Af Amer: 15 mL/min — ABNORMAL LOW (ref >60)
GLUCOSE: 76 mg/dL (ref 65–99)
PHOSPHORUS: 5.4 mg/dL — AB (ref 2.5–4.6)
POTASSIUM: 4.3 mmol/L (ref 3.5–5.1)
SODIUM: 134 mmol/L — AB (ref 135–145)

## 2016-04-17 LAB — CBC
HCT: 29.8 % — ABNORMAL LOW (ref 39.0–52.0)
HCT: 29.8 % — ABNORMAL LOW (ref 39.0–52.0)
Hemoglobin: 9.6 g/dL — ABNORMAL LOW (ref 13.0–17.0)
Hemoglobin: 9.4 g/dL — ABNORMAL LOW (ref 13.0–17.0)
MCH: 29.4 pg (ref 26.0–34.0)
MCH: 29.1 pg (ref 26.0–34.0)
MCHC: 32.2 g/dL (ref 30.0–36.0)
MCHC: 31.5 g/dL (ref 30.0–36.0)
MCV: 91.1 fL (ref 78.0–100.0)
MCV: 92.3 fL (ref 78.0–100.0)
PLATELETS: 73 10*3/uL — AB (ref 150–400)
Platelets: 61 10*3/uL — ABNORMAL LOW (ref 150–400)
RBC: 3.27 MIL/uL — AB (ref 4.22–5.81)
RBC: 3.23 MIL/uL — ABNORMAL LOW (ref 4.22–5.81)
RDW: 19.2 % — AB (ref 11.5–15.5)
RDW: 19.4 % — AB (ref 11.5–15.5)
WBC: 1.3 10*3/uL — AB (ref 4.0–10.5)
WBC: 4.9 10*3/uL (ref 4.0–10.5)

## 2016-04-17 LAB — BLOOD CULTURE ID PANEL (REFLEXED)
ACINETOBACTER BAUMANNII: NOT DETECTED
CANDIDA ALBICANS: NOT DETECTED
CANDIDA GLABRATA: NOT DETECTED
Candida krusei: NOT DETECTED
Candida parapsilosis: NOT DETECTED
Candida tropicalis: NOT DETECTED
Carbapenem resistance: NOT DETECTED
ENTEROBACTER CLOACAE COMPLEX: NOT DETECTED
ESCHERICHIA COLI: NOT DETECTED
Enterobacteriaceae species: NOT DETECTED
Enterococcus species: NOT DETECTED
HAEMOPHILUS INFLUENZAE: NOT DETECTED
Klebsiella oxytoca: NOT DETECTED
Klebsiella pneumoniae: NOT DETECTED
Listeria monocytogenes: NOT DETECTED
METHICILLIN RESISTANCE: NOT DETECTED
NEISSERIA MENINGITIDIS: NOT DETECTED
PROTEUS SPECIES: NOT DETECTED
PSEUDOMONAS AERUGINOSA: NOT DETECTED
STREPTOCOCCUS AGALACTIAE: NOT DETECTED
STREPTOCOCCUS PNEUMONIAE: NOT DETECTED
STREPTOCOCCUS PYOGENES: NOT DETECTED
STREPTOCOCCUS SPECIES: NOT DETECTED
Serratia marcescens: NOT DETECTED
Staphylococcus aureus (BCID): NOT DETECTED
Staphylococcus species: NOT DETECTED
Vancomycin resistance: NOT DETECTED

## 2016-04-17 LAB — PROTIME-INR
INR: 7.19 — AB
PROTHROMBIN TIME: 64 s — AB (ref 11.4–15.2)

## 2016-04-17 LAB — GLUCOSE, CAPILLARY
GLUCOSE-CAPILLARY: 94 mg/dL (ref 65–99)
GLUCOSE-CAPILLARY: 115 mg/dL — AB (ref 65–99)
GLUCOSE-CAPILLARY: 127 mg/dL — AB (ref 65–99)
Glucose-Capillary: 97 mg/dL (ref 65–99)

## 2016-04-17 LAB — LIPID PANEL
CHOL/HDL RATIO: 2.7 ratio
CHOLESTEROL: 68 mg/dL (ref 0–200)
HDL: 25 mg/dL — ABNORMAL LOW (ref >40)
LDL Cholesterol: 18 mg/dL (ref 0–99)
Triglycerides: 123 mg/dL (ref <150)
VLDL: 25 mg/dL (ref 0–40)

## 2016-04-17 LAB — HIV ANTIBODY (ROUTINE TESTING W REFLEX): HIV SCREEN 4TH GENERATION: NONREACTIVE

## 2016-04-17 MED ORDER — IPRATROPIUM-ALBUTEROL 0.5-2.5 (3) MG/3ML IN SOLN
3.0000 mL | Freq: Four times a day (QID) | RESPIRATORY_TRACT | Status: DC | PRN
  Administered 2016-04-17: 3 mL via RESPIRATORY_TRACT
  Filled 2016-04-17: qty 3

## 2016-04-17 MED ORDER — LIDOCAINE HCL (PF) 1 % IJ SOLN
INTRAMUSCULAR | Status: AC
  Filled 2016-04-17: qty 5

## 2016-04-17 MED ORDER — DIAZEPAM 5 MG PO TABS: 10.0000 mg | ORAL_TABLET | Freq: Three times a day (TID) | ORAL | Status: DC | PRN

## 2016-04-17 MED ORDER — DIAZEPAM 5 MG PO TABS
10.0000 mg | ORAL_TABLET | Freq: Three times a day (TID) | ORAL | Status: DC | PRN
  Administered 2016-04-17: 10 mg via ORAL
  Filled 2016-04-17: qty 2

## 2016-04-17 NOTE — Progress Notes (Signed)
[facility] Progress Note   Subjective: has acute R CVA by MRI  Vitals:   04/17/16 0416 04/17/16 0822 04/17/16 0853 04/17/16 0900  BP:  (!) 100/49    Pulse:  (!) 115 (!) 117   Resp:  17 19   Temp:  97.8 F (36.6 C)    TempSrc:  Oral    SpO2:  100% 95% 95%  Weight: 126.7 kg (279 lb 5.2 oz)     Height:        Inpatient medications: . calcitRIOL  0.5 mcg Oral Q T,Th,Sa-HD  . calcium acetate  1,334 mg Oral TID WC  . ceFEPime (MAXIPIME) IV  1 g Intravenous Q24H  . cholecalciferol  1,000 Units Oral Daily  . [START ON 04/21/2016] darbepoetin (ARANESP) injection - DIALYSIS  100 mcg Intravenous Q Thu-HD  . ferric gluconate (FERRLECIT/NULECIT) IV  125 mg Intravenous Q T,Th,Sa-HD  . insulin aspart  0-5 Units Subcutaneous QHS  . insulin aspart  0-9 Units Subcutaneous TID WC  . insulin glargine  15 Units Subcutaneous QHS  . levothyroxine  88 mcg Oral QAC breakfast  . multivitamin  1 tablet Oral QHS  . nicotine  14 mg Transdermal Daily  . pantoprazole  40 mg Oral Daily  . simvastatin  20 mg Oral q1800  . sodium chloride flush  3 mL Intravenous Q12H  . tiotropium  18 mcg Inhalation Daily  . Warfarin - Pharmacist Dosing Inpatient   Does not apply q1800   . diltiazem (CARDIZEM) infusion 5 mg/hr (04/17/16 0231)   diazepam, ipratropium-albuterol, oxyCODONE  Exam: Chron ill, facial/ chest / leg ecchymoses, facial droop on L No jvd Chest clear bilat RRR 1/6 sem no RG Abd obese bs pos ventral hernia/ mild tenderenss/ no ascites Ext marked 3+ bilat leg edema, worse below the knees, some weeping bilat Erythema bilat LE's slightly better, less tender today Neuro mod LE weakness, Ox 3 LUA AVF +bruit, R IJ perm cath site w/o drainage   Dialysis: TTS [facility]   4h 54min  2/2 bath 127kg   Hep 2700  R IJ cath/ maturing LUA AVF 10/03 Calc 0.5ug tiw Aranesp 80 ug Venofer 100 mg / hd to finish 11/9 Hb 9  Ca 8.4 P 8.7  pth 516      Assessment: 1. Gram neg sepsis/ leukopenia -  no other obvious source, so with HD cath in place will recommend removal and replacement. Had HD yest. On IV abx.  2. Acute CVA - small lesion R frontal 3. Pain / LE edema, poss cellulitis LE's - less tender 4. ESRD started HD recently , TTS pt 5. Chron LE edema  - poss R HF 6. Hypotension 7. Afib 8. Chron pain / narcotic dependence - for 16 yrs per pt 9. COPD 10. Bipolar d/o  Plan - will ask [facility] to remove HD cath, replace in 48 hrs. Cont abx. Adding midodrine   Kelly Splinter MD [facility] pager [phone]   04/17/2016, 11:45 AM    Recent Labs Lab 04/06/2016 0433 03/31/2016 1439 04/17/16 0138  NA 134* 135 134*  K 3.9 4.2 4.3  CL 93* 95* 96*  CO2 27 25 23   GLUCOSE 108* 74 76  BUN 64* 71* 39*  CREATININE 5.06* 5.58* 4.00*  CALCIUM 8.4* 8.2* 7.6*  PHOS  --  3.5 5.4*    Recent Labs Lab 04/20/2016 0433 04/09/2016 1439 04/17/16 0138  AST 42*  --   --   ALT 29  --   --  ALKPHOS 63  --   --   BILITOT 1.1  --   --   PROT 6.2*  --   --   ALBUMIN 2.8* 2.1* 2.2*    Recent Labs Lab 04/14/16 0333 04/21/2016 0433 04/17/2016 0800 04/17/16 0138  WBC 10.4 2.9* 1.3* 4.9  NEUTROABS 9.2* 2.3  --   --   HGB 8.7* 10.8* 9.6* 9.4*  HCT 26.3* 33.8* 29.8* 29.8*  MCV 88.6 91.4 91.1 92.3  PLT 88* 84* 73* 61*   Iron/TIBC/Ferritin/ %Sat    Component Value Date/Time   IRON 32 (L) 03/27/2016 1230   TIBC 332 03/27/2016 1230   IRONPCTSAT 10 (L) 03/27/2016 1230

## 2016-04-17 NOTE — Progress Notes (Signed)
*  PRELIMINARY RESULTS* Vascular Ultrasound Carotid Duplex (Doppler) has been completed.  Preliminary findings: Technically difficult due to patient movement and heavy breathing. Bilateral: No significant (1-39%) ICA stenosis. Antegrade vertebral flow.   Landry Mellow, RDMS, RVT  04/17/2016, 10:55 AM

## 2016-04-17 NOTE — Progress Notes (Signed)
Assisted Dr. Trula Slade in removing hemodialysis catheter at bedside.  MD removed the catheter without any difficulty

## 2016-04-17 NOTE — Progress Notes (Signed)
PT Cancellation Note  Patient Details Name: Johnny Sims MRN: [id number] DOB: [birth year]   Cancelled Treatment:    Reason Eval/Treat Not Completed: Medical issues which prohibited therapy  High INR; will hold PT for today, and check back to appropriateness of PT eval/mobilization tomorrow.  Roney Marion, [place]  [facility] Pager [phone] Office [phone]   Roney Marion [facility] 04/17/2016, 8:45 AM

## 2016-04-17 NOTE — Progress Notes (Signed)
STROKE TEAM PROGRESS NOTE   HISTORY OF PRESENT ILLNESS (per record) Johnny Sims is an 60 y.o. male with a history of end-stage renal disease on hemodialysis, hypertension, diabetes mellitus, atrial fibrillation on Coumadin, hypertension and hyperlipidemia, who presented with recurrent falls at home and markedly elevated INR (8.72), as well as slurred speech. He has been afebrile. WBC count was markedly low at 1.3. Lactic acid was 3.4. Blood glucose was 108. CT scan of the head showed no acute intracranial abnormality. 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 No focal weaknesses been reported. As well no facial droop has been reported.  LSN: Unclear tPA Given: No: No clear focal deficits; unknown when patient was last well. mRankin:   SUBJECTIVE (INTERVAL HISTORY) His RN and RT were at the bedside.  Patient seen directly after primary team completed rounds.  Briefly discussed case with primary team.  On exam, overall the patient feels his condition is gradually worsening. He sates that he feels short of breath.  He refused to use the nasal canula and requested a face mask for O2.  Once face mask was in place, he attempted to describe the mechanics of his fall but had difficulty doing so secondary to dysarthria and SOB.  He also reported excruciating pain in his legs, right > left.  Remainder of ROS was negative   OBJECTIVE Temp:  [97.3 F (36.3 C)-99 F (37.2 C)] 97.8 F (36.6 C) (10/22 0410) Pulse Rate:  [60-157] 118 (10/22 0100) Cardiac Rhythm: Atrial fibrillation (10/22 0700) Resp:  [8-31] 22 (10/22 0410) BP: (61-170)/(34-149) 107/67 (10/22 0410) SpO2:  [82 %-100 %] 99 % (10/22 0410) Weight:  [126.4 kg (278 lb 10.6 oz)-131.1 kg (289 lb 0.4 oz)] 126.7 kg (279 lb 5.2 oz) (10/22 0416)  CBC:  Recent Labs Lab 04/14/16 0333 04/19/2016 0433 04/07/2016 0800 04/17/16 0138  WBC 10.4 2.9* 1.3* 4.9  NEUTROABS 9.2* 2.3  --   --   HGB 8.7* 10.8* 9.6* 9.4*  HCT 26.3* 33.8*  29.8* 29.8*  MCV 88.6 91.4 91.1 92.3  PLT 88* 84* 73* 61*    Basic Metabolic Panel:  Recent Labs Lab 03/30/2016 1439 04/17/16 0138  NA 135 134*  K 4.2 4.3  CL 95* 96*  CO2 25 23  GLUCOSE 74 76  BUN 71* 39*  CREATININE 5.58* 4.00*  CALCIUM 8.2* 7.6*  PHOS 3.5 5.4*    Lipid Panel:    Component Value Date/Time   CHOL 68 04/17/2016 0138   TRIG 123 04/17/2016 0138   HDL 25 (L) 04/17/2016 0138   CHOLHDL 2.7 04/17/2016 0138   VLDL 25 04/17/2016 0138   LDLCALC 18 04/17/2016 0138   HgbA1c:  Lab Results  Component Value Date   HGBA1C 7.0 (H) 06/23/2015   Urine Drug Screen:    Component Value Date/Time   LABOPIA POSITIVE (A) 09/24/2014 0144   COCAINSCRNUR NONE DETECTED 09/24/2014 0144   COCAINSCRNUR POS (A) 09/20/2007 2131   LABBENZ NONE DETECTED 09/24/2014 0144   LABBENZ NEG 09/20/2007 2131   AMPHETMU NONE DETECTED 09/24/2014 0144   THCU NONE DETECTED 09/24/2014 0144   LABBARB NONE DETECTED 09/24/2014 0144      IMAGING  Dg Chest 2 View 04/02/2016 Cardiomegaly with mild congestive changes similar to prior radiograph. Superimposed pneumonia is not excluded.    Ct Head Wo Contrast 04/01/2016  CT HEAD:  RIGHT facial contusion. No acute intracranial process. Mild chronic small vessel ischemic disease.   CT CERVICAL SPINE:  No acute fracture or  malalignment. **An incidental finding of potential clinical significance has been found. 19 mm LEFT thyroid nodule for which follow up thyroid sonogram is recommended on a nonemergent basis. Status post RIGHT thyroidectomy.**    Mr Brain Wo Contrast 04/13/2016 Patient was not able to complete exam. Six sequences were obtained are motion degraded.  Tiny right frontal lobe acute infarct. No intracranial hemorrhage. Mild chronic microvascular changes.  Moderate global atrophy without hydrocephalus. Diffusion-weighted abnormality upper cervical spine/ skullbase of indeterminate etiology.   PHYSICAL EXAM General:  Mild to  moderate distress; complained of SOB HEENT-  Normocephalic, no lesions, without obvious abnormality.  Normal external eye and conjunctiva.  Normal external nose wnl.  Patient has bruising around his right eye.  Dry mucus membranes; very poor dentition Neck supple with no masses, nodes, nodules or enlargement. Cardiovascular - tachycardic, normal rhythm, normal S1 and S2, no murmur or gallop; significant bruising across right anterior chest wall Lungs - chest clear, no wheezing, rales, normal symmetric air entry Abdomen - soft, non-tender; bowel sounds normal; no masses,  no organomegaly Extremities - edema of both legs and feet; changes consistent with PVD; bandages on bilateral heals  Neurologic Examination: Mental Status: Lethargic, oriented, moderately short of breath.  Speech slurred without evidence of aphasia. Able to follow commands without difficulty. Cranial Nerves: II-Visual fields were normal. III/IV/VI-Pupils were equal and reacted. Extraocular movements were full and conjugate.    V/VII-no facial numbness and no facial weakness. VIII-normal. X-moderate dysarthria: symmetrical palatal movement. XI: trapezius strength/neck flexion strength normal bilaterally XII-midline tongue extension with normal strength. Motor: Mild generalized weakness, upper extremities less than lower extremities; normal muscle tone throughout. Sensory: Normal throughout. Cerebellar: Normal finger-to-nose testing. Gait: Deferred    ASSESSMENT/PLAN [name] is a 60 y.o. male with history of end-stage renal disease on hemodialysis, tobacco use, hypertension, diabetes mellitus, atrial fibrillation on Coumadin, hypertension and hyperlipidemia  presenting with recurrent falls at home and markedly elevated INR (8.72), and slurred speech.  He did not receive IV t-PA due to anticoagulation.  Stroke:  Non-dominant infarct possibly embolic secondary to atrial fibrillation.  Resultant  lethargy  MRI -  tiny right frontal lobe acute infarct.   MRA - pending  Carotid Doppler  pending  2D Echo  pending  LDL - 18  HgbA1c pending  VTE prophylaxis - Coumadin - INR 7.19  Diet renal with fluid restriction Fluid restriction: 1200 mL Fluid; Room service appropriate? Yes; Fluid consistency: Thin  warfarin daily prior to admission, now on warfarin daily per pharmacy - on hold - received vitamin K  Patient counseled to be compliant with his antithrombotic medications  Ongoing aggressive stroke risk factor management  Therapy recommendations:  pending  Disposition:  pending  Hypertension  BP tends to run low -> systolic approximately [id number]  Permissive hypertension (OK if < 220/120) but gradually normalize in 5-7 days  Long-term BP goal normotensive  Hyperlipidemia  Home meds:  Zocor 20 mg daily resumed in hospital  LDL 18, goal < 70  Continue statin at discharge  Diabetes  HgbA1c pending, goal < 7.0  Controlled  Other Stroke Risk Factors  Advanced age  Cigarette smoker - advised to stop smoking  ETOH use, advised to drink no more than 1 - 2 drink(s) a day  Obesity, Body mass index is 35.86 kg/m., recommend weight loss, diet and exercise as appropriate   PAF  Other Active Problems  Anemia - 9.4 / 29.8  Thrombocytopenia - 61 K  Leukopenia -  2.9 -> 1.3 -> 4.9  ESRD - 39 / 4   NA - 134  Bacteremia:  Jeddito  Coagulopathy   Hospital day # 1  ATTENDING NOTE: Patient was seen and examined by me personally. Documentation reflects findings. The laboratory and radiographic studies reviewed by me. ROS completed by me personally and pertinent positives fully documented  Condition:  Dyspneic; hemodynamically labile; increased heart required Cardizem gtt; BPs low at times with gtt  Assessment and plan completed by me personally and fully documented above. Plans/Recommendations include:     Ischemic stroke despite markedly elevated INR.  Stroke  work-up ongoing  Patient is at increased risk for hemorrhagic transformation due to elevated INR.  In addition, patient may also be at higher risk for hemorrhagic transformation of his ischemic stroke if the etiology of the ischemic stroke is to bacterial emboli.  Awaiting ECHO to determine if patient has vegetations.    Will follow results for work-up to assist with recommendations regarding secondary prevention therapeutics  SIGNED BY: Dr. Elissa Hefty    To contact Stroke Continuity provider, please refer to [email or web address] After hours, contact General Neurology

## 2016-04-17 NOTE — Progress Notes (Signed)
PHARMACY - PHYSICIAN COMMUNICATION CRITICAL VALUE ALERT - BLOOD CULTURE IDENTIFICATION (BCID)  Results for orders placed or performed during the hospital encounter of 03/29/2016  Blood Culture ID Panel (Reflexed) (Collected: 04/26/2016  4:44 AM)  Result Value Ref Range   Enterococcus species NOT DETECTED NOT DETECTED   Vancomycin resistance NOT DETECTED NOT DETECTED   Listeria monocytogenes NOT DETECTED NOT DETECTED   Staphylococcus species NOT DETECTED NOT DETECTED   Staphylococcus aureus NOT DETECTED NOT DETECTED   Methicillin resistance NOT DETECTED NOT DETECTED   Streptococcus species NOT DETECTED NOT DETECTED   Streptococcus agalactiae NOT DETECTED NOT DETECTED   Streptococcus pneumoniae NOT DETECTED NOT DETECTED   Streptococcus pyogenes NOT DETECTED NOT DETECTED   Acinetobacter baumannii NOT DETECTED NOT DETECTED   Enterobacteriaceae species NOT DETECTED NOT DETECTED   Enterobacter cloacae complex NOT DETECTED NOT DETECTED   Escherichia coli NOT DETECTED NOT DETECTED   Klebsiella oxytoca NOT DETECTED NOT DETECTED   Klebsiella pneumoniae NOT DETECTED NOT DETECTED   Proteus species NOT DETECTED NOT DETECTED   Serratia marcescens NOT DETECTED NOT DETECTED   Carbapenem resistance NOT DETECTED NOT DETECTED   Haemophilus influenzae NOT DETECTED NOT DETECTED   Neisseria meningitidis NOT DETECTED NOT DETECTED   Pseudomonas aeruginosa NOT DETECTED NOT DETECTED   Candida albicans NOT DETECTED NOT DETECTED   Candida glabrata NOT DETECTED NOT DETECTED   Candida krusei NOT DETECTED NOT DETECTED   Candida parapsilosis NOT DETECTED NOT DETECTED   Candida tropicalis NOT DETECTED NOT DETECTED    Name of physician (or Provider) Contacted: [place]  Pt growing GNR in 2/2 bld cx but none of tested organisms growing on BCID panel.  Changes to prescribed antibiotics required: Continue Cefepime. Consider d/c Vancomycin  Sherlon Handing, PharmD, BCPS Clinical pharmacist, pager [phone] 04/17/2016   4:17 AM

## 2016-04-17 NOTE — Progress Notes (Signed)
Pt's cell phone and glasses are at bedside, wallet and jewelry are locked up with security per pt request.

## 2016-04-17 NOTE — Progress Notes (Signed)
ANTICOAGULATION CONSULT NOTE - Follow Up Consult  Pharmacy Consult for Coumadin  Indication: h/o Afib (Supratherapeutic INR on admit)    New/acute CVA this admission  Allergies  Allergen Reactions  . Hydrocodone-Acetaminophen Palpitations and Other (See Comments)    Doesn't want to take  . Iodides Other (See Comments)    Patient states he doesn't know what iodine is and doesn't think he is allergic to it despite it being listed with his allergies  . Other Hives and Other (See Comments)    Steroid creams "Corndogs"- Nausea & vomiting   . Sulfacetamide Sodium Hives and Itching  . Tylenol [Acetaminophen] Nausea And Vomiting  . Gabapentin Swelling    SWELLING REACTION UNSPECIFIED   . Oxycodone-Acetaminophen Nausea And Vomiting  . Antihistamines, Chlorpheniramine-Type Other (See Comments)    unknown  . Hydralazine Rash  . Iodinated Diagnostic Agents Other (See Comments)    Patient states he doesn't know what iodine is and doesn't think he is allergic to it despite it being listed with his allergies  . Latex Rash  . Lidocaine Itching and Other (See Comments)    Patient is uncertain of this allergy  . Pentazocine Other (See Comments)    unknown  . Pheniramine Other (See Comments)    unknown  . Sulfa Antibiotics Itching and Other (See Comments)  . Tape Rash  . Vicodin [Hydrocodone-Acetaminophen] Palpitations and Other (See Comments)    Doesn't want to take    Patient Measurements: Height: 6\' 2"  (188 cm) Weight: 279 lb 5.2 oz (126.7 kg) IBW/kg (Calculated) : 82.2 Heparin Dosing Weight: 110 kg  Vital Signs: Temp: 97.8 F (36.6 C) (10/22 0822) Temp Source: Oral (10/22 0822) BP: 100/49 (10/22 0822) Pulse Rate: 117 (10/22 0853)  Labs:  Recent Labs  04/13/2016 0433 03/27/2016 0800 04/06/2016 1439 04/17/16 0138  HGB 10.8* 9.6*  --  9.4*  HCT 33.8* 29.8*  --  29.8*  PLT 84* 73*  --  61*  LABPROT 74.7*  --   --  64.0*  INR 8.72*  --   --  7.19*  CREATININE 5.06*  --  5.58*  4.00*    Estimated Creatinine Clearance: 27.8 mL/min (by C-G formula based on SCr of 4 mg/dL (H)).   Medications:  Scheduled:  . calcitRIOL  0.5 mcg Oral Q T,Th,Sa-HD  . calcium acetate  1,334 mg Oral TID WC  . ceFEPime (MAXIPIME) IV  1 g Intravenous Q24H  . cholecalciferol  1,000 Units Oral Daily  . [START ON 04/21/2016] darbepoetin (ARANESP) injection - DIALYSIS  100 mcg Intravenous Q Thu-HD  . ferric gluconate (FERRLECIT/NULECIT) IV  125 mg Intravenous Q T,Th,Sa-HD  . insulin aspart  0-5 Units Subcutaneous QHS  . insulin aspart  0-9 Units Subcutaneous TID WC  . insulin glargine  15 Units Subcutaneous QHS  . levothyroxine  88 mcg Oral QAC breakfast  . multivitamin  1 tablet Oral QHS  . nicotine  14 mg Transdermal Daily  . pantoprazole  40 mg Oral Daily  . simvastatin  20 mg Oral q1800  . sodium chloride flush  3 mL Intravenous Q12H  . tiotropium  18 mcg Inhalation Daily  . Warfarin - Pharmacist Dosing Inpatient   Does not apply q1800    Assessment: 60 y.o. male admitted on 03/29/2016 with AMS/slurredspeech, post fall on 04/14/16, generalized weakness. Recent hospitalization 03/26/16-04/02/16. Patient was seen in the ER on 04/14/16 after a fall at home and found to have an elevated INR. He was instructed to hold his warfarin  for three days, then restart and have INR rechecked. He reports he did not take his warfarin. INR increased from ER visit on 10/19 to 8.72 this admisstion.  Vitamin K 5 mg po given 10/21 AM  -Acute CVA per MRI brain [id number] -possibly embolic 2/2 afib. Today INR is 7.19, H/H low decresed more, pltc low & trending down 61k. No bleeding reported.    Goal of Therapy:  INR 2-3 Monitor platelets by anticoagulation protocol: Yes   Plan:  No coumadin today Daily PT/INR   Thank you for allowing pharmacy to be part of this patients care team. Nicole Cella, [place] Clinical Pharmacist Pager: [phone] 04/17/2016,10:52 AM

## 2016-04-17 NOTE — Progress Notes (Signed)
CRITICAL VALUE ALERT  Critical value received: INR 7.19  Date of notification:  04/17/16   Time of notification:  4:11 AM   Critical value read back:Yes.    Nurse who received alert:  Reap, Jon Gills  MD notified (1st page):  IMTS  Time of first page:  4:11 AM   MD notified (2nd page):  Time of second page:  Responding MD:    Time MD responded:

## 2016-04-17 NOTE — Progress Notes (Signed)
Subjective: patient seen and examined this morning.  He was having pain from sitting on the bed pan as well as pain in his left leg.  He reports having some wheezing but no shortness of breath.  Objective:  Vital signs in last 24 hours: Vitals:   04/17/16 0416 04/17/16 0822 04/17/16 0853 04/17/16 0900  BP:  (!) 100/49    Pulse:  (!) 115 (!) 117   Resp:  17 19   Temp:  97.8 F (36.6 C)    TempSrc:  Oral    SpO2:  100% 95% 95%  Weight: 279 lb 5.2 oz (126.7 kg)     Height:       General: alert, still having some slurred speech that at times is difficult to understand.  He is alert and oriented. HEENT: EOMI, no scleral icterus Cardiac: tachycardic rate, irregular rhythm, no rubs, murmurs or gallops Pulm: normal effort, anterior wheezes appreciated Abd: soft, nontender, nondistended Ext: 3+ woody edema with chronic overlying skin changes that appears consistent with venous stasis dermatitis.  Overlying cellulitis may be contributing.  Tender to palpation.  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  Able to follow commands.  Assessment/Plan:  Active Problems:   Hyperlipidemia   Essential hypertension   GERD (gastroesophageal reflux disease)   Atrial fibrillation status post cardioversion [facility])   Type 2 diabetes mellitus with diabetic polyneuropathy, with long-term current use of insulin (HCC)   ESRD on dialysis (HCC)   Weakness   Slurred speech   Leukopenia   Fall   Pressure injury of skin   CVA (cerebral vascular accident) ([place])   Contusion of face   Gram-negative bacteremia  Stroke: small right frontal lobe acute infarct identified on MRI brain yesterday afternoon.  Possibly embolic from atrial fibrillation although INR very supratherapeutic at 8.72 on admission and 7.19 this morning s/p 5mg  vitamin K.  Other stroke risk factors include DM, HTN,  HLD, age, tobacco use, obesity. - Neurology following, appreciate recommendations - MRA pending - carotid dopplers pending - A1C pending - LDL was 18 - warfarin dosing per pharm, holding for now given INR - not getting ASA secondary to elevated INR - BP has been running low with systolic about [id number].  Holding his home BP meds.  He is on Dilt gtt for RVR  Gram Negative Rod Bacteremia: blood cultures overnight have grown GNR with no organism identified on BCID.  Lactic acid was elevated yesterday but trended down.  Leukopenia of 1.9 yesterday improved this morning to 4.9 on broad spectrum antibiotics.  Potential sources of infection include recent right IJ tunneled catheter, hematogenous spread from his lower extremities, pneumonia, urine, etc.  Lungs were clear on admission and no evidence of PNA on CXR at admission.  Repeat is pending this morning.  Urine studies not obtained at admission but patient does not report symptoms.  If infection source were to be from tunneled catheter or cellulitis would expect more Gram positive organisms.  HIV was non-reactive in the setting of leukopenia.  He has remained afebrile throughout admission. - continue cefepime pending further identification of organisms on blood cultures - will repeat blood cultures tomorrow to ensure clearance - discontinue vancomycin  Supratherapeutic INR: On warfarin for stroke prophylaxis in the setting of chronic a-fib.  - INR today is 7.19 - Daily INR - Pharmacy consult for warfarin dosing; hold for now  Atrial fibrillation RVR: Rates are in the 110's this morning.  He became acutely hypotensive last night on diltiazem gtt but this had to be restarted after his rates went back up in to the 140s.  He is currently on 20mL/hr of diltizem with BP 100/49.  Appears last admission, rates were stable in the 110's and he was maxed out on his metoprolol. - Telemetry monitoring in SDU - continue Dilt gtt today.  EF on last Echo 03/28/2016  was 45-50%.  Repeat Echo pending as part of his stroke work up. - If rates stable today, consider stopping Dilt gtt and resuming home metoprolol tomorrow  ESRD on HD: T/Th/Sat schedule. Recently initiated dialysis earlier this month with R IJ TCD and maturing LUE fistula in place. Got HD yesterday.  Given bacteremia, may need a line holiday.  Volume status appears very difficult to control with patient. - appreciate nephrology recommendations - Fluid restrict, I/Os, daily weights - Daily renal function panel  - Continue home calcitriol, Vitamin D, and phoslo  - Continue home Aranesp injections Q Thursday   COPD: Respiratory status is presently stable.  CXR did not really show PNA at admission.  Having some wheezing today. - Continue home Spiriva daily  - Duonebs every 6 hours as needed  DM II:  - CBGs look better than expected given his insulin requirements last admission.  May be lower than expected in setting of his infection. - SSI with meals and QHS - Lantus 15 QHS  HTN: - Holding home Hydralazine 50 mg TID in setting of CVA and lower BP  Hypothyroidism: - TSH low this admission however unsure relevance in acute illness - Continue home dose synthroid 88 mcg daily  - Will need follow up for thyroid nodule noted on CT  Chronic Pain and Anxiety: on chronic opiates and diazepam for many years (about 15) per patient for diffuse body pain.  There were issues last admission regarding patients aggressiveness towards staff regarding his pain meds and diazepam.  These medications were initially held at admission given his presentation, however, have been added back as patient was requesting medications and in order to avoid withdrawal due to the chronic nature of him taking these meds. - Oxycodone 15mg  Q6H as needed.  Home dose is every 3 hours. - Diazepam 10mg  Q8H as needed.  Home dose is every 4 hours.   FEN: fluid restrict, hemodialysis, renal diet  VTE ppx: warfarin per pharmacy -  holding for now Code Status: FULL   Dispo: Anticipated discharge in approximately 3-4 day(s).   Jule Ser, DO 04/17/2016, 9:32 AM Pager: [phone]

## 2016-04-18 ENCOUNTER — Inpatient Hospital Stay (HOSPITAL_COMMUNITY): Payer: Medicaid Other

## 2016-04-18 ENCOUNTER — Other Ambulatory Visit (HOSPITAL_COMMUNITY): Payer: Medicaid Other

## 2016-04-18 DIAGNOSIS — I6789 Other cerebrovascular disease: Secondary | ICD-10-CM

## 2016-04-18 DIAGNOSIS — E1142 Type 2 diabetes mellitus with diabetic polyneuropathy: Secondary | ICD-10-CM

## 2016-04-18 DIAGNOSIS — E1122 Type 2 diabetes mellitus with diabetic chronic kidney disease: Secondary | ICD-10-CM

## 2016-04-18 DIAGNOSIS — R791 Abnormal coagulation profile: Secondary | ICD-10-CM

## 2016-04-18 DIAGNOSIS — E039 Hypothyroidism, unspecified: Secondary | ICD-10-CM

## 2016-04-18 DIAGNOSIS — I10 Essential (primary) hypertension: Secondary | ICD-10-CM

## 2016-04-18 DIAGNOSIS — D696 Thrombocytopenia, unspecified: Secondary | ICD-10-CM

## 2016-04-18 DIAGNOSIS — I4891 Unspecified atrial fibrillation: Secondary | ICD-10-CM

## 2016-04-18 DIAGNOSIS — D72819 Decreased white blood cell count, unspecified: Secondary | ICD-10-CM

## 2016-04-18 DIAGNOSIS — I5043 Acute on chronic combined systolic (congestive) and diastolic (congestive) heart failure: Secondary | ICD-10-CM

## 2016-04-18 DIAGNOSIS — Z794 Long term (current) use of insulin: Secondary | ICD-10-CM

## 2016-04-18 DIAGNOSIS — Z7951 Long term (current) use of inhaled steroids: Secondary | ICD-10-CM

## 2016-04-18 DIAGNOSIS — Z79899 Other long term (current) drug therapy: Secondary | ICD-10-CM

## 2016-04-18 DIAGNOSIS — I120 Hypertensive chronic kidney disease with stage 5 chronic kidney disease or end stage renal disease: Secondary | ICD-10-CM

## 2016-04-18 DIAGNOSIS — I639 Cerebral infarction, unspecified: Secondary | ICD-10-CM

## 2016-04-18 DIAGNOSIS — J449 Chronic obstructive pulmonary disease, unspecified: Secondary | ICD-10-CM

## 2016-04-18 LAB — CBC WITH DIFFERENTIAL/PLATELET
Basophils Absolute: 0 10*3/uL (ref 0.0–0.1)
Basophils Relative: 0 %
EOS PCT: 0 %
Eosinophils Absolute: 0 10*3/uL (ref 0.0–0.7)
HEMATOCRIT: 28.3 % — AB (ref 39.0–52.0)
Hemoglobin: 9 g/dL — ABNORMAL LOW (ref 13.0–17.0)
LYMPHS ABS: 0.5 10*3/uL — AB (ref 0.7–4.0)
Lymphocytes Relative: 5 %
MCH: 28.9 pg (ref 26.0–34.0)
MCHC: 31.8 g/dL (ref 30.0–36.0)
MCV: 91 fL (ref 78.0–100.0)
MONO ABS: 0.3 10*3/uL (ref 0.1–1.0)
Monocytes Relative: 3 %
Neutro Abs: 10.1 10*3/uL — ABNORMAL HIGH (ref 1.7–7.7)
Neutrophils Relative %: 92 %
Platelets: 34 10*3/uL — ABNORMAL LOW (ref 150–400)
RBC: 3.11 MIL/uL — AB (ref 4.22–5.81)
RDW: 19.6 % — AB (ref 11.5–15.5)
WBC Morphology: INCREASED
WBC: 10.9 10*3/uL — AB (ref 4.0–10.5)

## 2016-04-18 LAB — GLUCOSE, CAPILLARY
GLUCOSE-CAPILLARY: 143 mg/dL — AB (ref 65–99)
GLUCOSE-CAPILLARY: 130 mg/dL — AB (ref 65–99)
Glucose-Capillary: 132 mg/dL — ABNORMAL HIGH (ref 65–99)
Glucose-Capillary: 151 mg/dL — ABNORMAL HIGH (ref 65–99)

## 2016-04-18 LAB — RENAL FUNCTION PANEL
ALBUMIN: 2 g/dL — AB (ref 3.5–5.0)
Anion gap: 14 (ref 5–15)
BUN: 63 mg/dL — AB (ref 6–20)
CO2: 25 mmol/L (ref 22–32)
CREATININE: 4.99 mg/dL — AB (ref 0.61–1.24)
Calcium: 7.7 mg/dL — ABNORMAL LOW (ref 8.9–10.3)
Chloride: 95 mmol/L — ABNORMAL LOW (ref 101–111)
GFR calc Af Amer: 13 mL/min — ABNORMAL LOW (ref >60)
GFR, EST NON AFRICAN AMERICAN: 11 mL/min — AB (ref >60)
Glucose, Bld: 142 mg/dL — ABNORMAL HIGH (ref 65–99)
PHOSPHORUS: 6.6 mg/dL — AB (ref 2.5–4.6)
Potassium: 4.9 mmol/L (ref 3.5–5.1)
SODIUM: 134 mmol/L — AB (ref 135–145)

## 2016-04-18 LAB — CULTURE, BLOOD (ROUTINE X 2)

## 2016-04-18 LAB — VAS US CAROTID
LCCADDIAS: 13 cm/s
LCCAPSYS: 86 cm/s
LEFT ECA DIAS: -17 cm/s
LEFT VERTEBRAL DIAS: 9 cm/s
LICADSYS: -71 cm/s
Left CCA dist sys: 78 cm/s
Left CCA prox dias: 10 cm/s
Left ICA dist dias: -24 cm/s
Left ICA prox dias: -15 cm/s
Left ICA prox sys: -63 cm/s
RCCADSYS: -44 cm/s
RIGHT ECA DIAS: -9 cm/s
RIGHT VERTEBRAL DIAS: 12 cm/s
Right CCA prox dias: 13 cm/s
Right CCA prox sys: 75 cm/s

## 2016-04-18 LAB — HEMOGLOBIN A1C
HEMOGLOBIN A1C: 6.9 % — AB (ref 4.8–5.6)
Mean Plasma Glucose: 151 mg/dL

## 2016-04-18 LAB — ECHOCARDIOGRAM COMPLETE
HEIGHTINCHES: 74 in
Weight: 4616 oz

## 2016-04-18 LAB — PROTIME-INR
INR: 3.2
PROTHROMBIN TIME: 33.5 s — AB (ref 11.4–15.2)

## 2016-04-18 MED ORDER — SODIUM CHLORIDE 0.9 % IV BOLUS (SEPSIS)
250.0000 mL | Freq: Once | INTRAVENOUS | Status: AC
  Administered 2016-04-18: 250 mL via INTRAVENOUS

## 2016-04-18 MED ORDER — AMIODARONE HCL IN DEXTROSE 360-4.14 MG/200ML-% IV SOLN
60.0000 mg/h | INTRAVENOUS | Status: AC
  Administered 2016-04-18: 60 mg/h via INTRAVENOUS
  Filled 2016-04-18: qty 200

## 2016-04-18 MED ORDER — METOPROLOL TARTRATE 50 MG PO TABS
50.0000 mg | ORAL_TABLET | Freq: Two times a day (BID) | ORAL | Status: DC
  Administered 2016-04-19: 50 mg via ORAL
  Filled 2016-04-18: qty 1

## 2016-04-18 MED ORDER — DIAZEPAM 5 MG PO TABS: 5.0000 mg | ORAL_TABLET | Freq: Three times a day (TID) | ORAL | Status: DC | PRN

## 2016-04-18 MED ORDER — DILTIAZEM HCL-DEXTROSE 100-5 MG/100ML-% IV SOLN (PREMIX)
5.0000 mg/h | INTRAVENOUS | Status: DC
  Administered 2016-04-18: 5 mg/h via INTRAVENOUS
  Administered 2016-04-18: 7.5 mg/h via INTRAVENOUS
  Filled 2016-04-18: qty 100

## 2016-04-18 MED ORDER — CALCIUM ACETATE (PHOS BINDER) 667 MG PO CAPS
2001.0000 mg | ORAL_CAPSULE | Freq: Three times a day (TID) | ORAL | Status: DC
  Administered 2016-04-19 – 2016-04-27 (×23): 2001 mg via ORAL
  Filled 2016-04-18 (×25): qty 3

## 2016-04-18 MED ORDER — DIGOXIN 125 MCG PO TABS
0.1250 mg | ORAL_TABLET | Freq: Every day | ORAL | Status: DC
  Administered 2016-04-19: 0.125 mg via ORAL
  Filled 2016-04-18: qty 1

## 2016-04-18 MED ORDER — OXYCODONE HCL 5 MG PO TABS
15.0000 mg | ORAL_TABLET | Freq: Four times a day (QID) | ORAL | Status: DC | PRN
  Administered 2016-04-19: 15 mg via ORAL
  Filled 2016-04-18: qty 3

## 2016-04-18 MED ORDER — AMIODARONE HCL IN DEXTROSE 360-4.14 MG/200ML-% IV SOLN
30.0000 mg/h | INTRAVENOUS | Status: DC
  Administered 2016-04-18 – 2016-04-27 (×16): 30 mg/h via INTRAVENOUS
  Filled 2016-04-18 (×33): qty 200

## 2016-04-18 MED ORDER — DARBEPOETIN ALFA 200 MCG/0.4ML IJ SOSY
200.0000 ug | PREFILLED_SYRINGE | INTRAMUSCULAR | Status: DC
Start: 2016-04-21 — End: 2016-04-27
  Administered 2016-04-21: 200 ug via INTRAVENOUS
  Filled 2016-04-18 (×2): qty 0.4

## 2016-04-18 NOTE — Progress Notes (Signed)
Was paged by the nurse overnight for blood pressure 79/56 and diltiazem drip was stopped.  Gave patient 250cc bolus and held oxycodone.  After 250cc bolus, blood pressure was 99/64.  Current heart rate is between 106-115 and diltiazem drip has been restarted.  Oxycodone has been continued as well.  The morning team will be notified.

## 2016-04-18 NOTE — Progress Notes (Signed)
PT Cancellation Note  Patient Details Name: Johnny Sims MRN: [id number] DOB: June 17, 1956   Cancelled Treatment:    Reason Eval/Treat Not Completed: Medical issues which prohibited therapy  This morning pt lethargic and not able to follow commands. Currently alert with HR 118-148 (primarily 128-135). Attempted to speak with pt and he is very restless and repeatedly stating he needs his Valium and wants some ice. Patient becoming more restless with HR elevating, therefore PT evaluation deferred at this time. RN aware and in to assess patient.  Dimitrius Steedman 04/18/2016, 3:12 PM  Pager [phone]

## 2016-04-18 NOTE — Consult Note (Signed)
Cardiology Consult    Patient ID: Johnny Sims MRN: [id number], DOB/AGE: 11/07/1955   Admit date: 04/01/2016 Date of Consult: 04/18/2016  Primary Physician: Johnny Sims, [place] Reason for Consult: Afib Primary Cardiologist: Dr. Acie Sims Requesting Provider: Dr. Daryll Sims   Patient Profile    [name] is a 60 year old male with a past medical history of chronic atrial fibrillation (On Warfarin), ESRD on HD (recently started HD earlier this month), DM, Hepatitis C, HTN, and COPD. He presented to the ED on 04/13/2016 after a fall at home, found to have small right frontal lobe infarct. Developed rapid afib, cardiology consulted.   History of Present Illness    [name] was seen in the ED on 04/14/16 for SOB and was given nebulizer treatments and felt better; also had bleeding from wound on right knee. He is on Coumadin for chronic atrial fib and was found to have an INR of 5.93. He was instructed to hold his coumadin for 3 days.   He represented to the ED on 04/01/2016 with a fall and slurred speech. Was found to have a right frontal lobe infarct. His INR was 8.72 on admission. He developed rapid atrial fibrillation and was started on a Cardizem gtt, however developed hypotension and cardiology was consulted.   He has gram negative rod bacteremia in 2/2 blood cultures and is being treated by the primary team. There is some concern for bacterial emboli as the cause for his infarct in the setting of bacteremia. Plan is for TTE on Wednesday 04/10/2016.   His last Echo was on 03/28/16 and showed LVEF of 45-50%, left atrium was severely dilated. Also with PA pressure of 50.   Past Medical History   Past Medical History:  Diagnosis Date  . Anxiety   . Asthma   . Bipolar 1 disorder ([place])   . Chronic pain   . CKD (chronic kidney disease), stage II   . COPD (chronic obstructive pulmonary disease) ([address])   . Diabetes mellitus   . Fibromyalgia   . GERD (gastroesophageal reflux disease)   .  Hepatitis C   . Hepatitis C   . Hyperlipidemia   . Hypertension   . Hypothyroidism   . Melanoma ([place])    face, shoulder arm  . OA (osteoarthritis)   . PAF (paroxysmal atrial fibrillation) ([place])   . Pancreatitis, acute 2015  . RA (rheumatoid arthritis) ([place])   . Venous stasis     Past Surgical History:  Procedure Laterality Date  . AMPUTATION Left 11/11/2014   Procedure: FOOT, SECOND [place] AMPUTATION;  Surgeon: Newt Minion, MD;  Location: [facility];  Service: Orthopedics;  Laterality: Left;  . BASCILIC VEIN TRANSPOSITION Left 03/29/2016   Procedure: LEFT FIRST STAGE BASILIC VEIN TRANSPOSITION;  Surgeon: Waynetta Sandy, MD;  Location: [facility];  Service: Vascular;  Laterality: Left;  . CARDIOVERSION  02/03/2015  . CARDIOVERSION N/A 02/03/2015   Procedure: CARDIOVERSION;  Surgeon: Thayer Headings, MD;  Location: [facility];  Service: Cardiovascular;  Laterality: N/A;  . FRACTURE SURGERY Left    arm  . HERNIA REPAIR     umbicial  . INGUINAL HERNIA REPAIR Bilateral   . INSERTION OF DIALYSIS CATHETER Right 03/29/2016   Procedure: INSERTION OF RIGHT Internal Jugular DIALYSIS CATHETER;  Surgeon: Waynetta Sandy, MD;  Location: [facility];  Service: Vascular;  Laterality: Right;  . KNEE SURGERY     /H&P 02/20/2010  . SKIN CANCER EXCISION     melonoma  .  SUBMANDIBULAR GLAND EXCISION Left 12/25/2013   w/sialolithotomy  . SUBMANDIBULAR GLAND EXCISION Left 12/25/2013   Procedure: SUBMANDIBULAR GLAND STONE AND CHRONIC SIALOLITHIASIS EXCISION;  Surgeon: Melida Quitter, MD;  Location: [facility];  Service: ENT;  Laterality: Left;  . THYROIDECTOMY    . TONSILLECTOMY    . US ECHOCARDIOGRAPHY  02/19/2010   EF 55-60%     Allergies  Allergies  Allergen Reactions  . Hydrocodone-Acetaminophen Palpitations and Other (See Comments)    Doesn't want to take  . Iodides Other (See Comments)    Patient states he doesn't know what iodine is and doesn't think he is allergic to it despite it being listed  with his allergies  . Other Hives and Other (See Comments)    Steroid creams "Corndogs"- Nausea & vomiting   . Sulfacetamide Sodium Hives and Itching  . Tylenol [Acetaminophen] Nausea And Vomiting  . Gabapentin Swelling    SWELLING REACTION UNSPECIFIED   . Antihistamines, Chlorpheniramine-Type Other (See Comments)    unknown  . Hydralazine Rash  . Iodinated Diagnostic Agents Other (See Comments)    Patient states he doesn't know what iodine is and doesn't think he is allergic to it despite it being listed with his allergies  . Latex Rash  . Lidocaine Itching and Other (See Comments)    Patient is uncertain of this allergy  . Pentazocine Other (See Comments)    unknown  . Pheniramine Other (See Comments)    unknown  . Sulfa Antibiotics Itching and Other (See Comments)  . Tape Rash  . Vicodin [Hydrocodone-Acetaminophen] Palpitations and Other (See Comments)    Doesn't want to take    Inpatient Medications    . calcitRIOL  0.5 mcg Oral Q T,Th,Sa-HD  . calcium acetate  2,001 mg Oral TID WC  . ceFEPime (MAXIPIME) IV  1 g Intravenous Q24H  . [START ON 04/21/2016] darbepoetin (ARANESP) injection - DIALYSIS  200 mcg Intravenous Q Thu-HD  . ferric gluconate (FERRLECIT/NULECIT) IV  125 mg Intravenous Q T,Th,Sa-HD  . insulin aspart  0-5 Units Subcutaneous QHS  . insulin aspart  0-9 Units Subcutaneous TID WC  . insulin glargine  15 Units Subcutaneous QHS  . levothyroxine  88 mcg Oral QAC breakfast  . metoprolol tartrate  50 mg Oral BID  . multivitamin  1 tablet Oral QHS  . nicotine  14 mg Transdermal Daily  . pantoprazole  40 mg Oral Daily  . sodium chloride flush  3 mL Intravenous Q12H  . tiotropium  18 mcg Inhalation Daily  . Warfarin - Pharmacist Dosing Inpatient   Does not apply q1800    Family History    Family History  Problem Relation Age of Onset  . Hypertension Mother   . Alzheimer's disease Mother   . Heart disease Father     Social History    Social History    Social History  . Marital status: Single    Spouse name: N/A  . Number of children: N/A  . Years of education: N/A   Occupational History  . Not on file.   Social History Main Topics  . Smoking status: Current Every Day Smoker    Packs/day: 1.00    Years: 43.00    Types: Cigarettes  . Smokeless tobacco: Never Used  . Alcohol use Yes     Comment: rare  . Drug use: No  . Sexual activity: Not on file   Other Topics Concern  . Not on file   Social History Narrative  .  No narrative on file     Review of Systems    General:  No chills, fever, night sweats or weight changes.  Cardiovascular:  No chest pain, dyspnea on exertion, +edema, orthopnea, +palpitations, paroxysmal nocturnal dyspnea. Dermatological: No rash, lesions/masses Respiratory: No cough, dyspnea Urologic: No hematuria, dysuria Abdominal:   No nausea, vomiting, diarrhea, bright red blood per rectum, melena, or hematemesis Neurologic:  No visual changes, wkns, changes in mental status. All other systems reviewed and are otherwise negative except as noted above.  Physical Exam    Blood pressure 125/84, pulse (!) 114, temperature 97.3 F (36.3 C), temperature source Axillary, resp. rate (!) 27, height 6\' 2"  (1.88 m), weight 288 lb 8 oz (130.9 kg), SpO2 94 %.  General: Pleasant, NAD Psych: Normal affect. Neuro: Alert and oriented X 2, disoriented to situation. Moves all extremities spontaneously. HEENT: Normal, slurred speech  Neck: Supple without bruits or JVD. Lungs:  Resp regular and unlabored, CTA. Heart: irregular rhythm no s3, s4, or murmurs. Abdomen: Soft, non-tender, non-distended, BS + x 4.  Extremities: woody appearance to BLE, +3 edema. DP/PT/Radials 2+ and equal bilaterally.  Labs     Lab Results  Component Value Date   WBC 10.9 (H) 04/18/2016   HGB 9.0 (L) 04/18/2016   HCT 28.3 (L) 04/18/2016   MCV 91.0 04/18/2016   PLT 34 (L) 04/18/2016    Recent Labs Lab 04/04/2016 0433   04/18/16 0517  NA 134*  < > 134*  K 3.9  < > 4.9  CL 93*  < > 95*  CO2 27  < > 25  BUN 64*  < > 63*  CREATININE 5.06*  < > 4.99*  CALCIUM 8.4*  < > 7.7*  PROT 6.2*  --   --   BILITOT 1.1  --   --   ALKPHOS 63  --   --   ALT 29  --   --   AST 42*  --   --   GLUCOSE 108*  < > 142*  < > = values in this interval not displayed. Lab Results  Component Value Date   CHOL 68 04/17/2016   HDL 25 (L) 04/17/2016   LDLCALC 18 04/17/2016   TRIG 123 04/17/2016   Lab Results  Component Value Date   DDIMER  12/31/2006    0.44        AT THE INHOUSE ESTABLISHED CUTOFF VALUE OF 0.48 ug/mL FEU, THIS ASSAY HAS BEEN DOCUMENTED IN THE LITERATURE TO HAVE     Radiology Studies    Dg Chest 2 View  Result Date: 04/01/2016 CLINICAL DATA:  60 year old male with fall. History of COPD. Tachycardia. EXAM: CHEST  2 VIEW COMPARISON:  Radiograph dated 04/14/2016 FINDINGS: Right-sided dialysis catheter with tip over central SVC. There is cardiomegaly with vascular and interstitial prominence compatible with vascular congestion. Superimposed pneumonia is not excluded. Clinical correlation is recommended. These findings are relatively similar to prior radiograph. No focal consolidation, pleural effusion, or pneumothorax. No acute osseous pathology. IMPRESSION: Cardiomegaly with mild congestive changes similar to prior radiograph. Superimposed pneumonia is not excluded. Clinical correlation is recommended. Electronically Signed   By: Anner Crete M.D.   On: 04/20/2016 05:46   Dg Chest 2 View  Result Date: 03/26/2016 CLINICAL DATA:  Shortness of breath for 6 days. EXAM: CHEST  2 VIEW COMPARISON:  Chest x-rays dated 02/02/2016 and 06/24/2015. FINDINGS: There is stable mild cardiomegaly. Overall cardiomediastinal silhouette is stable in size and configuration. There is central pulmonary  vascular congestion and prominent perihilar edema, left greater than right. Additional interstitial edema throughout both  lungs. More confluent opacity at the left lung base could be associated alveolar pulmonary edema or superimposed pneumonia. Probable small left pleural effusion. IMPRESSION: 1. Cardiomegaly with pulmonary edema pattern consistent with CHF/volume overload. 2. A more confluent dense opacity at the left lung base (lingula) could be asymmetric alveolar pulmonary edema or superimposed pneumonia. In the absence of fever, favor asymmetric pulmonary edema. 3. Probable small left pleural effusion. Electronically Signed   By: Franki Cabot M.D.   On: 03/26/2016 12:44   Ct Head Wo Contrast  Result Date: 04/02/2016 CLINICAL DATA:  Fall. Tachycardia. History of hypertension, diabetes, hepatitis-C. EXAM: CT HEAD WITHOUT CONTRAST CT CERVICAL SPINE WITHOUT CONTRAST TECHNIQUE: Multidetector CT imaging of the head and cervical spine was performed following the standard protocol without intravenous contrast. Multiplanar CT image reconstructions of the cervical spine were also generated. COMPARISON:  None. FINDINGS: CT HEAD FINDINGS BRAIN: The ventricles and sulci are normal. Mild patchy white matter hypodensities compatible with chronic small vessel ischemic disease. No intraparenchymal hemorrhage, mass effect nor midline shift. No acute large vascular territory infarcts. No abnormal extra-axial fluid collections. Basal cisterns are patent. VASCULAR: Unremarkable. SKULL/SOFT TISSUES: No skull fracture. No significant soft tissue swelling. RIGHT facial soft tissue swelling in subcutaneous fat stranding most compatible with contusion. ORBITS/SINUSES: The included ocular globes and orbital contents are normal.No paranasal sinus air-fluid levels. Mastoid air cells are well aerated. OTHER: Predominately edentulous. CT CERVICAL SPINE FINDINGS ALIGNMENT: Maintained lordosis. Vertebral bodies in alignment. SKULL BASE AND VERTEBRAE: Cervical vertebral bodies and posterior elements are intact. Intervertebral disc heights preserved. No  destructive bony lesions. C1-2 articulation maintained. Mild RIGHT mid cervical facet arthropathy. SOFT TISSUES AND SPINAL CANAL: Nonacute. Status post RIGHT thyroidectomy. Heterogeneous LEFT thyroid lobe, 19 mm hypodense LEFT thyroid nodule. DISC LEVELS: No significant osseous canal stenosis. Mild RIGHT C3-4 neural foraminal narrowing. UPPER CHEST: Lung apices are clear. OTHER: Tunneled dialysis catheter via RIGHT internal jugular venous approach. IMPRESSION: CT HEAD: RIGHT facial contusion. No acute intracranial process. Mild chronic small vessel ischemic disease. CT CERVICAL SPINE: No acute fracture or malalignment. **An incidental finding of potential clinical significance has been found. 19 mm LEFT thyroid nodule for which follow up thyroid sonogram is recommended on a nonemergent basis. Status post RIGHT thyroidectomy.** Electronically Signed   By: Elon Alas M.D.   On: 04/05/2016 05:31   Ct Cervical Spine Wo Contrast  Result Date: 03/31/2016 CLINICAL DATA:  Fall. Tachycardia. History of hypertension, diabetes, hepatitis-C. EXAM: CT HEAD WITHOUT CONTRAST CT CERVICAL SPINE WITHOUT CONTRAST TECHNIQUE: Multidetector CT imaging of the head and cervical spine was performed following the standard protocol without intravenous contrast. Multiplanar CT image reconstructions of the cervical spine were also generated. COMPARISON:  None. FINDINGS: CT HEAD FINDINGS BRAIN: The ventricles and sulci are normal. Mild patchy white matter hypodensities compatible with chronic small vessel ischemic disease. No intraparenchymal hemorrhage, mass effect nor midline shift. No acute large vascular territory infarcts. No abnormal extra-axial fluid collections. Basal cisterns are patent. VASCULAR: Unremarkable. SKULL/SOFT TISSUES: No skull fracture. No significant soft tissue swelling. RIGHT facial soft tissue swelling in subcutaneous fat stranding most compatible with contusion. ORBITS/SINUSES: The included ocular globes  and orbital contents are normal.No paranasal sinus air-fluid levels. Mastoid air cells are well aerated. OTHER: Predominately edentulous. CT CERVICAL SPINE FINDINGS ALIGNMENT: Maintained lordosis. Vertebral bodies in alignment. SKULL BASE AND VERTEBRAE: Cervical vertebral bodies and posterior elements are intact. Intervertebral disc  heights preserved. No destructive bony lesions. C1-2 articulation maintained. Mild RIGHT mid cervical facet arthropathy. SOFT TISSUES AND SPINAL CANAL: Nonacute. Status post RIGHT thyroidectomy. Heterogeneous LEFT thyroid lobe, 19 mm hypodense LEFT thyroid nodule. DISC LEVELS: No significant osseous canal stenosis. Mild RIGHT C3-4 neural foraminal narrowing. UPPER CHEST: Lung apices are clear. OTHER: Tunneled dialysis catheter via RIGHT internal jugular venous approach. IMPRESSION: CT HEAD: RIGHT facial contusion. No acute intracranial process. Mild chronic small vessel ischemic disease. CT CERVICAL SPINE: No acute fracture or malalignment. **An incidental finding of potential clinical significance has been found. 19 mm LEFT thyroid nodule for which follow up thyroid sonogram is recommended on a nonemergent basis. Status post RIGHT thyroidectomy.** Electronically Signed   By: Elon Alas M.D.   On: 04/11/2016 05:31   Mr Brain Wo Contrast  Result Date: 04/19/2016 CLINICAL DATA:  60 year old diabetic hypertensive male post fall. Tachycardia. Subsequent encounter. EXAM: MRI HEAD WITHOUT CONTRAST TECHNIQUE: Multiplanar, multiecho pulse sequences of the brain and surrounding structures were obtained without intravenous contrast. COMPARISON:  03/30/2016 CT. FINDINGS: Patient was not able to complete exam. Six sequences were obtained are motion degraded. Brain: Tiny right frontal lobe acute infarct. No intracranial hemorrhage. Mild chronic microvascular changes. Moderate global atrophy without hydrocephalus. No intracranial mass lesion noted on this unenhanced exam. Vascular:  Evaluation limited. Major intracranial vascular structures appear to be patent. Skull and upper cervical spine: Evaluation limited. Diffusion-weighted abnormality upper cervical spine/ skullbase of indeterminate etiology. Sinuses/Orbits: Limited evaluation without gross abnormality. Other: As above. IMPRESSION: Patient was not able to complete exam. Six sequences were obtained are motion degraded. Tiny right frontal lobe acute infarct. No intracranial hemorrhage. Mild chronic microvascular changes. Moderate global atrophy without hydrocephalus. Diffusion-weighted abnormality upper cervical spine/ skullbase of indeterminate etiology. Electronically Signed   By: Genia Del M.D.   On: 04/14/2016 14:34   Dg Chest Port 1 View  Result Date: 04/17/2016 CLINICAL DATA:  Shortness of breath, cough, hypertension and diabetes. Dialysis dependent. EXAM: PORTABLE CHEST 1 VIEW COMPARISON:  03/27/2016 FINDINGS: Right IJ dialysis catheter tip in the proximal mid SVC as before. Cardiomegaly evident with central vascular congestion. Increased patchy left mid and lower lung airspace process concerning for developing pneumonia. Right lung remains clear. No effusion or pneumothorax. Trachea is midline. IMPRESSION: Worsening left mid and lower lung patchy airspace process concerning for developing pneumonia. Cardiomegaly with vascular congestion Electronically Signed   By: Jerilynn Mages.  Shick M.D.   On: 04/17/2016 10:25   Dg Chest Portable 1 View  Result Date: 04/14/2016 CLINICAL DATA:  Initial evaluation for acute shortness of breath. EXAM: PORTABLE CHEST 1 VIEW COMPARISON:  Prior radiograph from 03/29/2016. FINDINGS: Right-sided central venous catheter remains in place with tip overlying the distal SVC. Cardiomegaly is stable. Mediastinal silhouette within normal limits. Lungs are normally inflated. Asymmetric vascular congestion within the left mid and lower lung as compared to the right, which may reflect asymmetric edema or  possibly infiltrates. More mild vascular congestion within the right lung noted as well. No definite pleural effusion, although costophrenic angles are incompletely visualized. No pneumothorax. No acute osseous abnormality. IMPRESSION: Stable cardiomegaly with asymmetric vascular congestion and patchy opacities within the left lung as compared to the right. While these findings may reflect asymmetric pulmonary edema, possible superimposed infectious infiltrate within the left mid and lower lung could be considered in the correct clinical setting. Electronically Signed   By: Jeannine Boga M.D.   On: 04/14/2016 03:59   Dg Chest [facility] 1 View  Result  Date: 03/29/2016 CLINICAL DATA:  Status post right-sided dialysis catheter placement EXAM: PORTABLE CHEST 1 VIEW COMPARISON:  03/26/2016. FINDINGS: Right subclavian dialysis catheter with the tip projecting over the SVC. Hazy left midlung airspace disease which may reflect atelectasis versus pneumonia. There is no pleural effusion or pneumothorax. There is stable cardiomegaly. The osseous structures are unremarkable. IMPRESSION: Right subclavian dialysis catheter with the tip projecting over the SVC. Electronically Signed   By: Kathreen Devoid   On: 03/29/2016 12:18   Dg Fluoro Guide Cv Line Right  Result Date: 03/30/2016 CLINICAL DATA:  60 year old male with renal failure EXAM: CENTRAL VENOUS CATHETER WITH FLUOROSCOPY CONTRAST:  None FLUOROSCOPY TIME:  Op note COMPARISON:  None. FINDINGS: Intraoperative fluoroscopic spot image. Partial visualization of right IJ approach hemodialysis catheter. The tip appears to terminate in the superior vena cava. IMPRESSION: Intraoperative spot image of hemodialysis catheter placement, with the tip appearing to terminate superior vena cava. Signed, Dulcy Fanny. Earleen Newport, DO [facility] Radiology Electronically Signed   By: Corrie Mckusick D.O.   On: 03/30/2016 14:54   Mr [name]  Headm  Result Date: 04/18/2016 CLINICAL DATA:  Punctate CVA right frontal lobe. Abnormal MRI brain. EXAM: MRA HEAD WITHOUT CONTRAST TECHNIQUE: Angiographic images of the Circle of Willis were obtained using MRA technique without intravenous contrast. COMPARISON:  MRI brain 04/24/2016 FINDINGS: Internal carotid arteries are within normal limits from the high cervical segments through the ICA termini bilaterally. The A1 and M1 segments are normal. The anterior communicating artery is patent. MCA bifurcations are within normal limits bilaterally. The ACA and MCA branch vessels are unremarkable. The right vertebral artery is the dominant vessel. The right PICA origin is visualized and normal. The left PICA is not visualized. Bilateral AICA vessels are present and within normal limits. The basilar artery is normal. Both posterior cerebral arteries originate the basilar tip. The PCA branch vessels are within normal limits. IMPRESSION: Normal variant MRA circle of Willis without significant proximal stenosis, aneurysm, or branch vessel occlusion. Electronically Signed   By: San Morelle M.D.   On: 04/18/2016 07:37    EKG & Cardiac Imaging    EKG: afib with RVR  Echocardiogram: pending   Assessment & Plan    1. Atrial fibrillation with RVR: In the setting of bacteremia and acute CVA. Diltiazem was started but patient has developed hypotension. Will start Amiodarone IV with bolus for rate control.   This patients CHA2DS2-VASc Score and unadjusted Ischemic Stroke Rate (% per year) is equal to 9.7 % stroke rate/year from a score of 6 Above score calculated as 1 point each if present [CHF, HTN, DM, Vascular=MI/PAD/Aortic Plaque, Age if 65-74, or Male], 2 points each if present [Age > 75, or Stroke/TIA/TE]  Continue warfarin.   2. Bacteremia: Management per primary team.   3. ESRD on HD: Renal is following.   4. CVA: Management per Neuro.  Signed, Arbutus Leas, NP 04/18/2016, 5:09 PM Pager:  [phone]   The patient was seen, examined and discussed with Arbutus Leas, NP and I agree with the above.   60 year old male with a past medical history of chronic atrial fibrillation (On Warfarin), ESRD on HD (recently started HD earlier this month), DM, Hepatitis C, HTN, and COPD who presented to the ED on 04/11/2016 after a fall at home, found to have small right frontal lobe infarct and INR of 8 with thrombocytopenia.  Today he developed rapid afib with RVR, ventricular rate up to  130 BPM, started on cardizem drip but BP down to 80'.  We will discontinue cardizem drip, start amiodarone drip for acute rate control. Long term digoxin might be a good option for him. I will start digoxin 0.125 mg po today and tomorrow, than reduce to 0.065 mg po daily considering ESRD.  We will monitor K closely.  LVEF 40-45%, mild fluid overload on physical exam, fluids managed by HD.   Ena Dawley 04/18/2016

## 2016-04-18 NOTE — Evaluation (Signed)
Clinical/Bedside Swallow Evaluation Patient Details  Name: Johnny Sims MRN: [id number] Date of Birth: [birth year]  Today's Date: 04/18/2016 Time: SLP Start Time (ACUTE ONLY): 1132 SLP Stop Time (ACUTE ONLY): 1141 SLP Time Calculation (min) (ACUTE ONLY): 9 min  Past Medical History:  Past Medical History:  Diagnosis Date  . Anxiety   . Asthma   . Bipolar 1 disorder ([place])   . Chronic pain   . CKD (chronic kidney disease), stage II   . COPD (chronic obstructive pulmonary disease) ([place])   . Diabetes mellitus   . Fibromyalgia   . GERD (gastroesophageal reflux disease)   . Hepatitis C   . Hepatitis C   . Hyperlipidemia   . Hypertension   . Hypothyroidism   . Melanoma ([place])    face, shoulder arm  . OA (osteoarthritis)   . PAF (paroxysmal atrial fibrillation) ([place])   . Pancreatitis, acute 2015  . RA (rheumatoid arthritis) ([place])   . Venous stasis    Past Surgical History:  Past Surgical History:  Procedure Laterality Date  . AMPUTATION Left 11/11/2014   Procedure: FOOT, SECOND Narciso AMPUTATION;  Surgeon: Newt Minion, MD;  Location: [facility];  Service: Orthopedics;  Laterality: Left;  . BASCILIC VEIN TRANSPOSITION Left 03/29/2016   Procedure: LEFT FIRST STAGE BASILIC VEIN TRANSPOSITION;  Surgeon: Waynetta Sandy, MD;  Location: [facility];  Service: Vascular;  Laterality: Left;  . CARDIOVERSION  02/03/2015  . CARDIOVERSION N/A 02/03/2015   Procedure: CARDIOVERSION;  Surgeon: Thayer Headings, MD;  Location: [facility];  Service: Cardiovascular;  Laterality: N/A;  . FRACTURE SURGERY Left    arm  . HERNIA REPAIR     umbicial  . INGUINAL HERNIA REPAIR Bilateral   . INSERTION OF DIALYSIS CATHETER Right 03/29/2016   Procedure: INSERTION OF RIGHT Internal Jugular DIALYSIS CATHETER;  Surgeon: Waynetta Sandy, MD;  Location: [facility];  Service: Vascular;  Laterality: Right;  . KNEE SURGERY     /H&P 02/20/2010  . SKIN CANCER EXCISION     melonoma  . SUBMANDIBULAR GLAND EXCISION  Left 12/25/2013   w/sialolithotomy  . SUBMANDIBULAR GLAND EXCISION Left 12/25/2013   Procedure: SUBMANDIBULAR GLAND STONE AND CHRONIC SIALOLITHIASIS EXCISION;  Surgeon: Melida Quitter, MD;  Location: [facility];  Service: ENT;  Laterality: Left;  . THYROIDECTOMY    . TONSILLECTOMY    . US ECHOCARDIOGRAPHY  02/19/2010   EF 55-60%   HPI:  [name] is a 60 y.o. male with history of end-stage renal disease on hemodialysis, tobacco use, hypertension, diabetes mellitus, atrial fibrillation on Coumadin, hypertension and hyperlipidemia  presenting with recurrent falls at home and markedly elevated INR (8.72), and slurred speech.  He did not receive IV t-PA due to anticoagulation. MRI shows tiny right frontal infarct.    Assessment / Plan / Recommendation Clinical Impression  Requested order for swallow evaluation as patient with notable wet vocal quality at baseline during cognitive-linguistic evaluation with inability to clear. Patient with evidence of a moderate-severe dysphagia at bedside today, likely impacted by poor cognitive status and lethargy. Oral motor exam revealed significant labial and lingual weakness resulting in decreased labial seal and lingual movement with decreased oral control of bolus. Signs of decreased airway protection noted at baseline (wet vocal quality with secretions) continued during po trials with inabilitiy to attempt compensatory strategies to facilitate safety. Recommend NPO with SLP f/u at bedside to determine readiness to re-advance. Prognosis good with improved mentation.     Aspiration Risk  Severe aspiration risk    Diet Recommendation NPO   Medication Administration: Via alternative means    Other  Recommendations Oral Care Recommendations: Oral care QID   Follow up Recommendations Inpatient Rehab      Frequency and Duration min 2x/week  2 weeks       Prognosis Prognosis for Safe Diet Advancement: Good Barriers to Reach Goals: Cognitive deficits       Swallow Study   General HPI: [name] is a 60 y.o. male with history of end-stage renal disease on hemodialysis, tobacco use, hypertension, diabetes mellitus, atrial fibrillation on Coumadin, hypertension and hyperlipidemia  presenting with recurrent falls at home and markedly elevated INR (8.72), and slurred speech.  He did not receive IV t-PA due to anticoagulation. MRI shows tiny right frontal infarct.  Type of Study: Bedside Swallow Evaluation Previous Swallow Assessment: none Diet Prior to this Study: Regular;Thin liquids Temperature Spikes Noted: No Respiratory Status: Nasal cannula History of Recent Intubation: No Behavior/Cognition: Lethargic/Drowsy Oral Cavity Assessment: Dry Oral Care Completed by SLP: Yes Oral Cavity - Dentition: Adequate natural dentition Vision: Functional for self-feeding Self-Feeding Abilities: Needs assist Patient Positioning: Upright in bed Baseline Vocal Quality: Aphonic;Wet Volitional Cough: Weak Volitional Swallow: Unable to elicit    Oral/Motor/Sensory Function Overall Oral Motor/Sensory Function: Severe impairment Facial ROM: Reduced right;Reduced left Facial Symmetry: Within Functional Limits Facial Strength: Reduced right;Reduced left Facial Sensation: Within Functional Limits Lingual ROM: Reduced right;Reduced left Lingual Symmetry: Within Functional Limits Lingual Strength: Reduced Velum:  (difficult to view)   Ice Chips Ice chips: Not tested   Thin Liquid Thin Liquid: Impaired Presentation: Spoon Oral Phase Impairments: Reduced labial seal;Reduced lingual movement/coordination Pharyngeal  Phase Impairments: Suspected delayed Swallow;Multiple swallows;Cough - Immediate;Wet Vocal Quality    Nectar Thick Nectar Thick Liquid: Not tested   Honey Thick Honey Thick Liquid: Not tested   Puree Puree: Not tested   Solid   GO  Mase Dhondt MA, CCC-SLP [phone]  Solid: Not tested        Leilynn Pilat Meryl 04/18/2016,1:32  PM

## 2016-04-18 NOTE — Progress Notes (Signed)
Subjective: Interval History: low bps overnight, lethargic, not coop, disoriented.  Objective: Vital signs in last 24 hours: Temp:  [97.1 F (36.2 C)-98.7 F (37.1 C)] 97.1 F (36.2 C) (10/23 0345) Pulse Rate:  [52-135] 103 (10/23 0700) Resp:  [8-26] 16 (10/23 0615) BP: (76-153)/(49-129) 88/56 (10/23 0732) SpO2:  [88 %-100 %] 100 % (10/23 0700) FiO2 (%):  [24 %] 24 % (10/22 0900) Weight:  [130.9 kg (288 lb 8 oz)] 130.9 kg (288 lb 8 oz) (10/23 0500) Weight change: -0.237 kg (-8.4 oz)  Intake/Output from previous day: 10/22 0701 - 10/23 0700 In: 958 [P.O.:820; I.V.:88; IV Piggyback:50] Out: 225 [Urine:225] Intake/Output this shift: No intake/output data recorded.  General appearance: not alert, not coop, lethargic, poorly responsive Resp: diminished breath sounds bilaterally and rales bibasilar Chest wall: PC out. briuse R chest wall.   Cardio: irregularly irregular rhythm, S1, S2 normal and systolic murmur: holosystolic 2/6, blowing at apex GI: obese,pos bs,liver down 6-7 cm Extremities: edema 3+  , brawny edema, with stasis changes LE, has presacral edema and AVF LUA B&T  Lab Results:  Recent Labs  04/17/16 0138 04/18/16 0503  WBC 4.9 10.9*  HGB 9.4* 9.0*  HCT 29.8* 28.3*  PLT 61* 34*   BMET:  Recent Labs  04/17/16 0138 04/18/16 0517  NA 134* 134*  K 4.3 4.9  CL 96* 95*  CO2 23 25  GLUCOSE 76 142*  BUN 39* 63*  CREATININE 4.00* 4.99*  CALCIUM 7.6* 7.7*   No results for input(s): PTH in the last 72 hours. Iron Studies: No results for input(s): IRON, TIBC, TRANSFERRIN, FERRITIN in the last 72 hours.  Studies/Results: Mr Brain Wo Contrast  Result Date: 04/14/2016 CLINICAL DATA:  60 year old diabetic hypertensive male post fall. Tachycardia. Subsequent encounter. EXAM: MRI HEAD WITHOUT CONTRAST TECHNIQUE: Multiplanar, multiecho pulse sequences of the brain and surrounding structures were obtained without intravenous contrast. COMPARISON:  03/27/2016 CT.  FINDINGS: Patient was not able to complete exam. Six sequences were obtained are motion degraded. Brain: Tiny right frontal lobe acute infarct. No intracranial hemorrhage. Mild chronic microvascular changes. Moderate global atrophy without hydrocephalus. No intracranial mass lesion noted on this unenhanced exam. Vascular: Evaluation limited. Major intracranial vascular structures appear to be patent. Skull and upper cervical spine: Evaluation limited. Diffusion-weighted abnormality upper cervical spine/ skullbase of indeterminate etiology. Sinuses/Orbits: Limited evaluation without gross abnormality. Other: As above. IMPRESSION: Patient was not able to complete exam. Six sequences were obtained are motion degraded. Tiny right frontal lobe acute infarct. No intracranial hemorrhage. Mild chronic microvascular changes. Moderate global atrophy without hydrocephalus. Diffusion-weighted abnormality upper cervical spine/ skullbase of indeterminate etiology. Electronically Signed   By: Genia Del M.D.   On: 04/24/2016 14:34   Dg Chest Port 1 View  Result Date: 04/17/2016 CLINICAL DATA:  Shortness of breath, cough, hypertension and diabetes. Dialysis dependent. EXAM: PORTABLE CHEST 1 VIEW COMPARISON:  04/19/2016 FINDINGS: Right IJ dialysis catheter tip in the proximal mid SVC as before. Cardiomegaly evident with central vascular congestion. Increased patchy left mid and lower lung airspace process concerning for developing pneumonia. Right lung remains clear. No effusion or pneumothorax. Trachea is midline. IMPRESSION: Worsening left mid and lower lung patchy airspace process concerning for developing pneumonia. Cardiomegaly with vascular congestion Electronically Signed   By: Jerilynn Mages.  Shick M.D.   On: 04/17/2016 10:25   Mr [name] Headm  Result Date: 04/18/2016 CLINICAL DATA:  Punctate CVA right frontal lobe. Abnormal MRI brain. EXAM: MRA HEAD WITHOUT CONTRAST TECHNIQUE: Angiographic images of the  Circle of Willis were  obtained using MRA technique without intravenous contrast. COMPARISON:  MRI brain 04/24/2016 FINDINGS: Internal carotid arteries are within normal limits from the high cervical segments through the ICA termini bilaterally. The A1 and M1 segments are normal. The anterior communicating artery is patent. MCA bifurcations are within normal limits bilaterally. The ACA and MCA branch vessels are unremarkable. The right vertebral artery is the dominant vessel. The right PICA origin is visualized and normal. The left PICA is not visualized. Bilateral AICA vessels are present and within normal limits. The basilar artery is normal. Both posterior cerebral arteries originate the basilar tip. The PCA branch vessels are within normal limits. IMPRESSION: Normal variant MRA circle of Willis without significant proximal stenosis, aneurysm, or branch vessel occlusion. Electronically Signed   By: San Morelle M.D.   On: 04/18/2016 07:37    I have reviewed the patient's current medications.  Assessment/Plan: 1 ESRD cath out, get new one in 48h and do HD. Has vol xs 2 DM per primary 3 Confusion suspect xs narcotics 4 low bps, narcotics and ? Role of infx 5 Anemia esa 6 HPTH vit D 7 Bipolar 8 Afib off cardiazem, may need Amio 9 Sepsis on AB,  P new PC, HD, esa,AB, limit Narcotics    LOS: 2 days   Orest Dygert L 04/18/2016,8:04 AM

## 2016-04-18 NOTE — Progress Notes (Signed)
MD notified that Cardizem stopped; BP 88/56 (65)

## 2016-04-18 NOTE — Progress Notes (Signed)
OT Cancellation Note  Patient Details Name: Johnny Sims MRN: [id number] DOB: December 16, 1955   Cancelled Treatment:    Reason Eval/Treat Not Completed: Medical issues which prohibited therapy. Pt with elevated resting HR and inability to participate in evaluation. Will follow.  Malka So 04/18/2016, 3:23 PM  [phone]

## 2016-04-18 NOTE — Progress Notes (Signed)
ANTICOAGULATION CONSULT NOTE - Follow Up Consult  Pharmacy Consult for Coumadin  Indication: h/o Afib (Supratherapeutic INR on admit); New/acute CVA this admission  Allergies  Allergen Reactions  . Hydrocodone-Acetaminophen Palpitations and Other (See Comments)    Doesn't want to take  . Iodides Other (See Comments)    Patient states he doesn't know what iodine is and doesn't think he is allergic to it despite it being listed with his allergies  . Other Hives and Other (See Comments)    Steroid creams "Corndogs"- Nausea & vomiting   . Sulfacetamide Sodium Hives and Itching  . Tylenol [Acetaminophen] Nausea And Vomiting  . Gabapentin Swelling    SWELLING REACTION UNSPECIFIED   . Antihistamines, Chlorpheniramine-Type Other (See Comments)    unknown  . Hydralazine Rash  . Iodinated Diagnostic Agents Other (See Comments)    Patient states he doesn't know what iodine is and doesn't think he is allergic to it despite it being listed with his allergies  . Latex Rash  . Lidocaine Itching and Other (See Comments)    Patient is uncertain of this allergy  . Pentazocine Other (See Comments)    unknown  . Pheniramine Other (See Comments)    unknown  . Sulfa Antibiotics Itching and Other (See Comments)  . Tape Rash  . Vicodin [Hydrocodone-Acetaminophen] Palpitations and Other (See Comments)    Doesn't want to take    Patient Measurements: Height: 6\' 2"  (188 cm) Weight: 288 lb 8 oz (130.9 kg) IBW/kg (Calculated) : 82.2 Heparin Dosing Weight: 110 kg  Vital Signs: Temp: 97.8 F (36.6 C) (10/23 0847) Temp Source: Axillary (10/23 0847) BP: 88/56 (10/23 0732) Pulse Rate: 110 (10/23 0852)  Labs:  Recent Labs  03/31/2016 0433 04/06/2016 0800 04/15/2016 1439 04/17/16 0138 04/18/16 0503 04/18/16 0517  HGB 10.8* 9.6*  --  9.4* 9.0*  --   HCT 33.8* 29.8*  --  29.8* 28.3*  --   PLT 84* 73*  --  61* 34*  --   LABPROT 74.7*  --   --  64.0* 33.5*  --   INR 8.72*  --   --  7.19* 3.20  --    CREATININE 5.06*  --  5.58* 4.00*  --  4.99*    Estimated Creatinine Clearance: 22.6 mL/min (by C-G formula based on SCr of 4.99 mg/dL (H)).   Medications:  Scheduled:  . calcitRIOL  0.5 mcg Oral Q T,Th,Sa-HD  . calcium acetate  2,001 mg Oral TID WC  . ceFEPime (MAXIPIME) IV  1 g Intravenous Q24H  . [START ON 04/21/2016] darbepoetin (ARANESP) injection - DIALYSIS  200 mcg Intravenous Q Thu-HD  . ferric gluconate (FERRLECIT/NULECIT) IV  125 mg Intravenous Q T,Th,Sa-HD  . insulin aspart  0-5 Units Subcutaneous QHS  . insulin aspart  0-9 Units Subcutaneous TID WC  . insulin glargine  15 Units Subcutaneous QHS  . levothyroxine  88 mcg Oral QAC breakfast  . metoprolol tartrate  50 mg Oral BID  . multivitamin  1 tablet Oral QHS  . nicotine  14 mg Transdermal Daily  . pantoprazole  40 mg Oral Daily  . sodium chloride flush  3 mL Intravenous Q12H  . tiotropium  18 mcg Inhalation Daily  . Warfarin - Pharmacist Dosing Inpatient   Does not apply q1800    Assessment: 60 y.o. male admitted on 04/11/2016 with AMS/slurredspeech, post fall on 04/14/16, generalized weakness. Recent hospitalization 03/26/16-04/02/16. Patient was seen in the ER on 04/14/16 after a fall at home and found  to have an elevated INR. He was instructed to hold his warfarin for three days, then restart and have INR rechecked. He reports he did not take his warfarin. INR increased from ER visit on 10/19 to 8.72 this admisstion.  Vitamin K 5 mg po given 10/21 AM  -Acute CVA per MRI brain [id number] -possibly embolic 2/2 afib.  Today's INR is down to 3.2, but his hemoglobin is also down to 9g/dL and his platelet count is down to 34K.  Usually with an INR approaching 3 I would like to restart his Coumadin but I am concerned about his decreased platelet count and risk for bleeding complications.  I discussed with the IMTS team - we will hold Coumadin today and reevaluate his labs 10/24 before restarting.  Goal of Therapy:  INR  2-3 Monitor platelets by anticoagulation protocol: Yes   Plan:  No coumadin today Daily PT/INR  Legrand Como, Pharm.D., BCPS, AAHIVP Clinical Pharmacist Phone: [phone] or [phone] Pager: [phone] 04/18/2016, 11:08 AM

## 2016-04-18 NOTE — Op Note (Signed)
    Patient name: Johnny Sims MRN: [id number] DOB: [birth year] Sex: male   Pre-operative Diagnosis: infected dialysis catheter Post-operative diagnosis:  Same Surgeon:  Annamarie Major Procedure:   Removal of right IJ dialysis catheter Anesthesia:  lacal Specimens:  Cath tip for cx  Indications:  Patient with positive blood cx with no source other than catheter.  Removal has been requested  Procedure:   The patient was prepped and draped in the usual sterile fashion.  1% lidocaine was used for anesthesia.  The catheter cuff was dissected and the catheter was removed intact.  Manual pressure was held for hemostasis.  A pressure dressing was applied.     Theotis Burrow, M.D. [facility] Office: [phone] Pager:  [phone]

## 2016-04-18 NOTE — Progress Notes (Signed)
STROKE TEAM PROGRESS NOTE   SUBJECTIVE (INTERVAL HISTORY) No family at the bedside.  Patient in bed with hypophonia, dysarthria and mild respiratory distress, but following commands. INR improved. On HD. Need TEE to rule out endocarditis, on Abx now.     OBJECTIVE Temp:  [97.1 F (36.2 C)-98.7 F (37.1 C)] 97.8 F (36.6 C) (10/23 0847) Pulse Rate:  [52-135] 110 (10/23 0852) Cardiac Rhythm: Atrial fibrillation (10/23 0700) Resp:  [8-26] 16 (10/23 0852) BP: (76-153)/(49-129) 88/56 (10/23 0732) SpO2:  [88 %-100 %] 92 % (10/23 0700) Weight:  [288 lb 8 oz (130.9 kg)] 288 lb 8 oz (130.9 kg) (10/23 0500)  CBC:  Recent Labs Lab 04/14/2016 0433  04/17/16 0138 04/18/16 0503  WBC 2.9*  < > 4.9 10.9*  NEUTROABS 2.3  --   --  10.1*  HGB 10.8*  < > 9.4* 9.0*  HCT 33.8*  < > 29.8* 28.3*  MCV 91.4  < > 92.3 91.0  PLT 84*  < > 61* 34*  < > = values in this interval not displayed.  Basic Metabolic Panel:   Recent Labs Lab 04/17/16 0138 04/18/16 0517  NA 134* 134*  K 4.3 4.9  CL 96* 95*  CO2 23 25  GLUCOSE 76 142*  BUN 39* 63*  CREATININE 4.00* 4.99*  CALCIUM 7.6* 7.7*  PHOS 5.4* 6.6*    Lipid Panel:     Component Value Date/Time   CHOL 68 04/17/2016 0138   TRIG 123 04/17/2016 0138   HDL 25 (L) 04/17/2016 0138   CHOLHDL 2.7 04/17/2016 0138   VLDL 25 04/17/2016 0138   LDLCALC 18 04/17/2016 0138   HgbA1c:  Lab Results  Component Value Date   HGBA1C 6.9 (H) 04/17/2016   Urine Drug Screen:     Component Value Date/Time   LABOPIA POSITIVE (A) 09/24/2014 0144   COCAINSCRNUR NONE DETECTED 09/24/2014 0144   COCAINSCRNUR POS (A) 09/20/2007 2131   LABBENZ NONE DETECTED 09/24/2014 0144   LABBENZ NEG 09/20/2007 2131   AMPHETMU NONE DETECTED 09/24/2014 0144   THCU NONE DETECTED 09/24/2014 0144   LABBARB NONE DETECTED 09/24/2014 0144      IMAGING I have personally reviewed the radiological images below and agree with the radiology interpretations.  Dg Chest 2  View 04/15/2016 Cardiomegaly with mild congestive changes similar to prior radiograph. Superimposed pneumonia is not excluded.   Ct Head Wo Contrast 04/22/2016 CT HEAD:  RIGHT facial contusion. No acute intracranial process. Mild chronic small vessel ischemic disease.   Mr Brain Wo Contrast 04/12/2016 Tiny right frontal lobe acute infarct. No intracranial hemorrhage. Mild chronic microvascular changes.  Moderate global atrophy without hydrocephalus. Diffusion-weighted abnormality upper cervical spine/ skullbase of indeterminate etiology.  Dg Chest Port 1 View 04/17/2016 IMPRESSION: Worsening left mid and lower lung patchy airspace process concerning for developing pneumonia. Cardiomegaly with vascular congestion    Mr Virgel Paling 04/18/2016 IMPRESSION: Normal variant MRA circle of Willis without significant proximal stenosis, aneurysm, or branch vessel occlusion.   CUS - Bilateral: No significant (1-39%) ICA stenosis. Antegrade vertebral flow.   TTE pending   PHYSICAL EXAM  Temp:  [97.1 F (36.2 C)-98.7 F (37.1 C)] 97.8 F (36.6 C) (10/23 0847) Pulse Rate:  [52-135] 110 (10/23 0852) Resp:  [8-26] 16 (10/23 0852) BP: (76-153)/(49-129) 88/56 (10/23 0732) SpO2:  [88 %-100 %] 92 % (10/23 0700) Weight:  [288 lb 8 oz (130.9 kg)] 288 lb 8 oz (130.9 kg) (10/23 0500)  General - Well nourished, well developed, mild respiratory  distress  Ophthalmologic - Fundi not visualized due to noncooperation.  Cardiovascular - irregularly irregular heart rate and rhythm  Skin - ecchymosis at right face and right chest wall  Neuro - awake alert, but psychomotor slowing. Severe dysarthria and hypophonia. Orientated to name and place, but not to time or age. Following simple commands intermittently. PERRL, incomplete horizontal gaze bilaterally, visual field intact, facial weakness bilaterally, tongue in middle. Bilateral UE 4/5, with asterixis, FTN intact but slow. Bilateral LE 2/5 proximal and  0/5 distal, with pain stimulation, BLE mild withdraw. DTR 1+ and no babinski. Sensation symmetrical. Gait not tested.    ASSESSMENT/PLAN Mr. KIMBER MENNE is a 60 y.o. male with history of end-stage renal disease on hemodialysis, tobacco use, hypertension, diabetes mellitus, atrial fibrillation on Coumadin, hypertension and hyperlipidemia  presenting with recurrent falls at home and markedly elevated INR (8.72), and slurred speech.  He did not receive IV t-PA due to anticoagulation.  Stroke:  Tiny right frontal lobe infarct, need to rule out endocarditis. Infarct due to afib less likely due to high INR levels, although stroke can be due to previous subtherapeutic INR  Resultant  lethargy  MRI - tiny right frontal lobe acute infarct.   MRA - unremarkable  Carotid Doppler unremarkable  2D Echo  Pending  Recommend to have TEE to rule out endocarditis.  LDL - 18  HgbA1c 6.9  VTE prophylaxis - INR 7.19 - > 3.20 Diet renal with fluid restriction Fluid restriction: 1200 mL Fluid; Room service appropriate? Yes; Fluid consistency: Thin  warfarin daily prior to admission, now warfarin on hold due to high INR  Patient counseled to be compliant with his antithrombotic medications  Ongoing aggressive stroke risk factor management  Therapy recommendations:  pending  Disposition:  Pending  Bacteremia  2/2 BCx showed G- rods  Repeat Cx pending  On cefepime  ID on board  Need TEE to rule out endocarditis.  Persistent Afib  On coumadin at home with fluctuating INR  INR now 8.72->7.19->3.20  Coumadin on hold  Resume coumadin once INR less than 3.0  INR goal 2-3  Thrombocytopenia  Platelet 61->34  Avoid ASA for now  Close monitoring  Bleeding precautions  Hypertension  BP on the low side  IVF Long-term BP goal normotensive  Hyperlipidemia  Home meds:  Zocor 20 mg daily resumed in hospital  LDL 18, goal < 70  Hold off zocor due to low LDL and risk of  bleeding  Diabetes  HgbA1c pending, goal < 7.0  Controlled  Tobacco abuse  Current smoker  Smoking cessation counseling provided  Nicotine patch provided  Pt is willing to quit  Other Stroke Risk Factors  Advanced age  ETOH use, advised to drink no more than 1 - 2 drink(s) a day  Obesity, Body mass index is 37.04 kg/m., recommend weight loss, diet and exercise as appropriate   Other Active Problems  Anemia - 9.4 / 29.8  Leukopenia - 2.9 -> 1.3 -> 4.9  ESRD on HD - 39 / [facility] day # 2  This patient is critically ill due to coagulopathy due to high INR, afib on coumadin, bacteremia, thrombocytopenia and at significant risk of neurological worsening, death form bleeding, heart failure, sepsis, shock. This patient's care requires constant monitoring of vital signs, hemodynamics, respiratory and cardiac monitoring, review of multiple databases, neurological assessment, discussion with family, other specialists and medical decision making of high complexity. I spent 40 minutes of neurocritical care time in the  care of this patient.  Rosalin Hawking, MD PhD Stroke Neurology 04/18/2016 10:15 AM    To contact Stroke Continuity provider, please refer to [email or web address] After hours, contact General Neurology

## 2016-04-18 NOTE — Consult Note (Signed)
[place] Nurse wound consult note Reason for Consult:Sacral wound Wound type:Full thickness Pressure injury  Pressure Ulcer POA: Yes Measurement:Multiple open areas  Wound [id number] Drainage (amount, consistency, odor) small serosangenous Periwound: Intact Dressing procedure/placement/frequency:Foam Border, change every 3 days or prn soiled  WOC Nurse wound consult note Reason for Consult:Sacral wound Wound type:Deep Tissue Injury  Pressure Ulcer POA: Yes Measurement:2.5x2.5 Wound [id number] Drainage (amount, consistency, odor) none Periwound: Intact Dressing procedure/placement/frequency:Foam Border, change every 3 days or prn soiled  WOC Nurse wound consult note Reason for Consult:Lt heel wound Wound type:DTI Pressure Ulcer POA: Yes Measurement:8x4 Wound [id number] Drainage (amount, consistency, odor) none Periwound: Intact Dressing procedure/placement/frequency:Foam Border, change every 3 days or prn soiled  WOC Nurse wound consult note Reason for Consult:Sacral wound/ lower back  Wound type:DTI to Lower back Pressure Ulcer POA: Yes Measurement:3x7 Wound [id number] Drainage (amount, consistency, odor) none Periwound: Intact Dressing procedure/placement/frequency:Foam Border, change every 3 days or prn soiled   WOC Nurse wound consult note Reason for Consult:Black 3rd toe Wound type:Nonstageable/ black eschar  Pressure Ulcer POA: Yes Measurement:2x2 Wound [id number] Drainage (amount, consistency, odor) none Periwound: Intact Dressing procedure/placement/frequency:Best practice is to leave dry and open to air. Paint with betadine once daily.   [place] Nurse wound consult note Reason for Consult:RLE wound Wound type:Full thickness Stasis ulcer Pressure Ulcer POA: Yes Measurement:2x2 Wound [id number] Drainage (amount, consistency, odor) small serosangenous Periwound: Intact Dressing procedure/placement/frequency:Foam Border, change every 3 days or prn soiled  WOC Nurse  wound consult note Reason for Consult:LLE wound Wound type:Full thickness Stasis ulcer Pressure Ulcer POA: Yes Measurement:1x1  Wound [id number] Drainage (amount, consistency, odor) small serosangenous Periwound: Intact Dressing procedure/placement/frequency:Foam Border, change every 3 days or prn soiled  WOC Nurse wound consult note Reason for Consult:LLE wound Wound type:Full thickness Stasis ulcer Pressure Ulcer POA: Yes Measurement:0.5x0.5 Wound [id number] Drainage (amount, consistency, odor) small serosangenous Periwound: Intact Dressing procedure/placement/frequency:Foam Border, change every 3 days or prn soiled Please re-consult if further assistance is needed.  Thank-you,  Julien Girt MSN, [place], [place], [place], [place]

## 2016-04-18 NOTE — Progress Notes (Signed)
MD made aware that patient heartrate continues to elevate throughout the day. New orders to be written per MD to resume cardizem drip with parameters to titrate for MAP. Will carry out orders once received. Will continue to monitor patient closely.

## 2016-04-18 NOTE — Progress Notes (Addendum)
  Echocardiogram 2D Echocardiogram has been performed.  Aggie Cosier 04/18/2016, 4:32 PM

## 2016-04-18 NOTE — Progress Notes (Deleted)
60 year old male with a past medical history of chronic atrial fibrillation (On Warfarin), ESRD on HD (recently started HD earlier this month), DM, Hepatitis C, HTN, and COPD who presented to the ED on 04/21/2016 after a fall at home, found to have small right frontal lobe infarct and INR of 8 with thrombocytopenia.  Today he developed rapid afib with RVR, ventricular rate up to 130 BPM, started on cardizem drip but BP down to 80'.  We will discontinue cardizem drip, start amiodarone drip for acute rate control. Long term digoxin might be a good option for him. I will start digoxin 0.125 mg po today and tomorrow, than reduce to 0.065 mg po daily considering ESRD.  We will monitor K closely.  LVEF 40-45%, mild fluid overload on physical exam, fluids managed by HD.   Ena Dawley 04/18/2016

## 2016-04-18 NOTE — Evaluation (Signed)
Speech Language Pathology Evaluation Patient Details Name: Johnny Sims MRN: [id number] DOB: [birth year] Today's Date: 04/18/2016 Time: [id number] SLP Time Calculation (min) (ACUTE ONLY): 10 min  Problem List:  Patient Active Problem List   Diagnosis Date Noted  . CVA (cerebral vascular accident) ([place]) 04/17/2016  . Gram-negative bacteremia 04/17/2016  . Contusion of face   . Weakness 04/01/2016  . Slurred speech 04/25/2016  . Leukopenia 04/23/2016  . Pressure injury of skin 04/08/2016  . Fall   . Congestive heart failure ([place])   . ESRD on dialysis ([place])   . Hypokalemia 03/26/2016  . Encounter for therapeutic drug monitoring 09/07/2015  . Pyrexia   . Upper respiratory tract infection   . Type 2 diabetes mellitus with diabetic polyneuropathy, with long-term current use of insulin ([place])   . Thyroid activity decreased   . Atrial fibrillation status post cardioversion ([place]) 02/03/2015  . GERD (gastroesophageal reflux disease) 11/10/2014  . Tobacco use disorder 08/28/2014  . Hyperlipidemia 07/16/2012  . Essential hypertension 07/16/2012  . COPD exacerbation ([place]) 07/16/2012   Past Medical History:  Past Medical History:  Diagnosis Date  . Anxiety   . Asthma   . Bipolar 1 disorder ([place])   . Chronic pain   . CKD (chronic kidney disease), stage II   . COPD (chronic obstructive pulmonary disease) ([place])   . Diabetes mellitus   . Fibromyalgia   . GERD (gastroesophageal reflux disease)   . Hepatitis C   . Hepatitis C   . Hyperlipidemia   . Hypertension   . Hypothyroidism   . Melanoma ([place])    face, shoulder arm  . OA (osteoarthritis)   . PAF (paroxysmal atrial fibrillation) ([place])   . Pancreatitis, acute 2015  . RA (rheumatoid arthritis) ([place])   . Venous stasis    Past Surgical History:  Past Surgical History:  Procedure Laterality Date  . AMPUTATION Left 11/11/2014   Procedure: FOOT, SECOND Eutimio AMPUTATION;  Surgeon: Newt Minion, MD;  Location: [facility];  Service:  Orthopedics;  Laterality: Left;  . BASCILIC VEIN TRANSPOSITION Left 03/29/2016   Procedure: LEFT FIRST STAGE BASILIC VEIN TRANSPOSITION;  Surgeon: Waynetta Sandy, MD;  Location: [facility];  Service: Vascular;  Laterality: Left;  . CARDIOVERSION  02/03/2015  . CARDIOVERSION N/A 02/03/2015   Procedure: CARDIOVERSION;  Surgeon: Thayer Headings, MD;  Location: [facility];  Service: Cardiovascular;  Laterality: N/A;  . FRACTURE SURGERY Left    arm  . HERNIA REPAIR     umbicial  . INGUINAL HERNIA REPAIR Bilateral   . INSERTION OF DIALYSIS CATHETER Right 03/29/2016   Procedure: INSERTION OF RIGHT Internal Jugular DIALYSIS CATHETER;  Surgeon: Waynetta Sandy, MD;  Location: [facility];  Service: Vascular;  Laterality: Right;  . KNEE SURGERY     /H&P 02/20/2010  . SKIN CANCER EXCISION     melonoma  . SUBMANDIBULAR GLAND EXCISION Left 12/25/2013   w/sialolithotomy  . SUBMANDIBULAR GLAND EXCISION Left 12/25/2013   Procedure: SUBMANDIBULAR GLAND STONE AND CHRONIC SIALOLITHIASIS EXCISION;  Surgeon: Melida Quitter, MD;  Location: [facility];  Service: ENT;  Laterality: Left;  . THYROIDECTOMY    . TONSILLECTOMY    . US ECHOCARDIOGRAPHY  02/19/2010   EF 55-60%   HPI:  [name] is a 60 y.o. male with history of end-stage renal disease on hemodialysis, tobacco use, hypertension, diabetes mellitus, atrial fibrillation on Coumadin, hypertension and hyperlipidemia  presenting with recurrent falls at home and markedly elevated INR (8.72), and slurred  speech.  He did not receive IV t-PA due to anticoagulation. MRI shows tiny right frontal infarct.    Assessment / Plan / Recommendation Clinical Impression  Cognitive-linguistic evaluation complete. Patient presents with severe oral motor deficits characterized by labial and lingual weakness with poor labial seal and limited lingual movement leading to severe dysarthria. In addition, patient with moderate dysphonia characterized by breathy, wet vocal  quality. Cognitively, patient with poor focused attention, decreased short term recall of information and basic problem solving skills. Note that patient sleeping upon arrival to room for evaluation, woken by SLP which may have exacerbated deficits.     SLP Assessment  Patient needs continued Speech Lanaguage Pathology Services    Follow Up Recommendations  Inpatient Rehab    Frequency and Duration min 2x/week  2 weeks      SLP Evaluation Cognition  Overall Cognitive Status: Impaired/Different from baseline Arousal/Alertness: Lethargic Orientation Level: Oriented to person;Oriented to place;Oriented to situation Attention: Focused Focused Attention: Impaired Focused Attention Impairment: Verbal basic;Functional basic Memory: Impaired Memory Impairment: Retrieval deficit;Decreased recall of new information Awareness: Impaired Awareness Impairment: Intellectual impairment Problem Solving: Impaired Problem Solving Impairment: Verbal basic;Functional basic Comments: aroused from sleep for evaluation       Comprehension  Auditory Comprehension Overall Auditory Comprehension: Appears within functional limits for tasks assessed (for basic 1-step commands) Visual Recognition/Discrimination Discrimination: Not tested    Expression Expression Primary Mode of Expression: Verbal Verbal Expression Overall Verbal Expression: Appears within functional limits for tasks assessed (although exam limited due to cognitive status/lethargy)   Oral / Motor  Oral Motor/Sensory Function Overall Oral Motor/Sensory Function: Severe impairment Facial ROM: Reduced right;Reduced left Facial Symmetry: Within Functional Limits Facial Strength: Reduced right;Reduced left Facial Sensation: Within Functional Limits Lingual ROM: Reduced right;Reduced left Lingual Symmetry: Within Functional Limits Lingual Strength: Reduced Velum:  (difficult to view) Motor Speech Overall Motor Speech:  Impaired Respiration: Impaired Level of Impairment: Word Phonation: Aphonic;Wet Resonance: Within functional limits Articulation: Impaired Level of Impairment: Word Intelligibility: Intelligibility reduced Word: 0-24% accurate Motor Planning: Witnin functional limits Motor Speech Errors: Aware Effective Techniques: Increased vocal intensity   GO            Gabriel Rainwater MA, CCC-SLP [phone]          Sibley Rolison Meryl 04/18/2016, 1:26 PM

## 2016-04-18 NOTE — Progress Notes (Signed)
Subjective: Patient is lethargic this morning. Responsive to sternal rub, moans in response to questions.   Objective:  Vital signs in last 24 hours: Vitals:   04/18/16 0615 04/18/16 0630 04/18/16 0700 04/18/16 0732  BP: (!) 105/59 103/62 (!) 86/53 (!) 88/56  Pulse:   (!) 103   Resp: 16     Temp:      TempSrc:      SpO2:   100%   Weight:      Height:       Physical Exam: Constitutional: Altered with slurred speech  HEENT: Large right facial hematoma, Airway was patent and he was breathing comfortable on room air Cardiovascular: Tachycardic and irregular, no murmurs, rubs, or gallops.  Pulmonary/Chest: CTAB, no wheezes, rales, or rhonchi. Abdominal: Soft, non tender, non distended. +BS.  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 Dysarthric.  Assessment/Plan:  Gram Negative Rod Bacteremia: In 2/2 blood cultures. Right IJ TCD pulled yesterday at bedside per vascular. Patient continues to be altered with slurred speech, likely due to his bacteremia. Leukopenia has improved from 1.3 on admission to 10.9 today. Patient had some hypotension overnight and his diltiazem ggt was held. He received a 250 cc bolus. Will hold off on further fluid resuscitation for now given ESRD on HD.  -- Continue IV cefepime  -- Repeat blood cultures today  -- Hold parameters for pain medication (Hold & Call MD if SBP<90, HR<65, RR<10, O2<90, or altered mental status)  CVA: MRI with small right front lobe acute infarct. No deficits on exam however patient is dysarthric. Possibly embolic from chronic atrial fibrillation. Patient was in RVR with rates up to 150s on presentation, however, supra therapeutic with INR 8.7. Other stroke risk factors include DM, HTN, HLD, age, tobacco use, obesity. Etiology is also  concerning for endocarditis / bacterial emboli in the setting of GNR baceremia.  -- Neurology following, appreciate recommendations  -- TTE today to evaluate for vegetations -- Plan for TEE Wednesday per cards  -- Stroke work up per neuro -- Holding ASA given INR -- [place] per pharmacy; likely restart tomorrow when INR < 3  -- A1C 6.9 -- Lipid panel with LDL 18, continue simvastatin  -- Hold home hydralazine to allow permissive HTN   Supratherapeutic INR: On warfarin for stroke prophylaxis in the setting of chronic a-fib. INR of 8.7 on presentation, down today to 3.2.  -- S/p PO vit K 5 mg on admission  -- Daily INR -- Pharmacy consult, appreciate recs  Atrial fibrillation: Patient was in RVR with rates up to 150 on presentation. He was placed on a diltiazem drip for 2 days which was discontinued overnight. Rates are well controlled today  100-110.  -- Tele monitoring -- Restart home metoprolol at half dose due to hypotension, 50 mg BID  ESRD on HD: T/Th/Sat schedule. Recently initiated dialysis earlier this month with R IJ TCD and LUE fistula placed per vascular. Patient received dialysis on Saturday after admission. His catheter was pulled yesterday due to GNR bacteremia. Currently euvolemic. Lungs are clear and patient is breathing comfortably on RA.  -- F/u nephrology recs for further HD and temporary line placement  -- Fluid restrict, I/Os, daily weights -- Daily hepatic function panel  -- Continue home calcitriol, Vitamin D, and phoslo  -- Continue home Aranesp injections Q  Thursday   COPD: Lungs are clear, patient is breathing comfortably. Negative CXR. -- Continue home Spiriva daily  -- Avoid albuterol for now given RVR  DM II:  -- SSI with meals and QHS -- Lantus 15 QHS  HTN: -- Hold home Hydralazine 50 mg TID to allow permissive HTN  Hypothyroidism: -- TSH low this admission however unsure relevance in acute illness -- Continue home dose synthroid 88 mcg daily     FEN: fluid restrict, hemodialysis, renal diet  VTE ppx: warfarin per pharmacy  Code Status: FULL   Dispo: Anticipated discharge in approximately 2-3 day(s).   Velna Ochs, MD 04/18/2016, 8:50 AM Pager: [phone]

## 2016-04-19 ENCOUNTER — Inpatient Hospital Stay (HOSPITAL_COMMUNITY): Payer: Medicaid Other

## 2016-04-19 DIAGNOSIS — I63411 Cerebral infarction due to embolism of right middle cerebral artery: Secondary | ICD-10-CM

## 2016-04-19 DIAGNOSIS — J9601 Acute respiratory failure with hypoxia: Secondary | ICD-10-CM

## 2016-04-19 DIAGNOSIS — R4781 Slurred speech: Secondary | ICD-10-CM

## 2016-04-19 DIAGNOSIS — E7800 Pure hypercholesterolemia, unspecified: Secondary | ICD-10-CM

## 2016-04-19 DIAGNOSIS — A419 Sepsis, unspecified organism: Secondary | ICD-10-CM

## 2016-04-19 DIAGNOSIS — R6521 Severe sepsis with septic shock: Secondary | ICD-10-CM

## 2016-04-19 LAB — BLOOD CULTURE ID PANEL (REFLEXED)
ACINETOBACTER BAUMANNII: NOT DETECTED
CANDIDA GLABRATA: NOT DETECTED
Candida albicans: NOT DETECTED
Candida krusei: NOT DETECTED
Candida parapsilosis: NOT DETECTED
Candida tropicalis: NOT DETECTED
ENTEROBACTER CLOACAE COMPLEX: NOT DETECTED
ENTEROBACTERIACEAE SPECIES: NOT DETECTED
ENTEROCOCCUS SPECIES: NOT DETECTED
Escherichia coli: NOT DETECTED
Haemophilus influenzae: NOT DETECTED
Klebsiella oxytoca: NOT DETECTED
Klebsiella pneumoniae: NOT DETECTED
LISTERIA MONOCYTOGENES: NOT DETECTED
Methicillin resistance: DETECTED — AB
NEISSERIA MENINGITIDIS: NOT DETECTED
PROTEUS SPECIES: NOT DETECTED
Pseudomonas aeruginosa: NOT DETECTED
STAPHYLOCOCCUS SPECIES: DETECTED — AB
STREPTOCOCCUS AGALACTIAE: NOT DETECTED
STREPTOCOCCUS SPECIES: NOT DETECTED
Serratia marcescens: NOT DETECTED
Staphylococcus aureus (BCID): NOT DETECTED
Streptococcus pneumoniae: NOT DETECTED
Streptococcus pyogenes: NOT DETECTED

## 2016-04-19 LAB — CBC
HCT: 26.2 % — ABNORMAL LOW (ref 39.0–52.0)
HCT: 25.3 % — ABNORMAL LOW (ref 39.0–52.0)
Hemoglobin: 8.5 g/dL — ABNORMAL LOW (ref 13.0–17.0)
Hemoglobin: 8.3 g/dL — ABNORMAL LOW (ref 13.0–17.0)
MCH: 29.6 pg (ref 26.0–34.0)
MCH: 29 pg (ref 26.0–34.0)
MCHC: 32.4 g/dL (ref 30.0–36.0)
MCHC: 32.8 g/dL (ref 30.0–36.0)
MCV: 91.3 fL (ref 78.0–100.0)
MCV: 88.5 fL (ref 78.0–100.0)
PLATELETS: 28 10*3/uL — AB (ref 150–400)
PLATELETS: 25 10*3/uL — AB (ref 150–400)
RBC: 2.87 MIL/uL — AB (ref 4.22–5.81)
RBC: 2.86 MIL/uL — ABNORMAL LOW (ref 4.22–5.81)
RDW: 19.6 % — ABNORMAL HIGH (ref 11.5–15.5)
RDW: 19.1 % — AB (ref 11.5–15.5)
WBC: 14.6 10*3/uL — AB (ref 4.0–10.5)
WBC: 18.8 10*3/uL — AB (ref 4.0–10.5)

## 2016-04-19 LAB — DIC (DISSEMINATED INTRAVASCULAR COAGULATION) PANEL
APTT: 51 s — AB (ref 24–36)
SMEAR REVIEW: NONE SEEN

## 2016-04-19 LAB — RENAL FUNCTION PANEL
ALBUMIN: 1.7 g/dL — AB (ref 3.5–5.0)
Albumin: 1.8 g/dL — ABNORMAL LOW (ref 3.5–5.0)
Anion gap: 16 — ABNORMAL HIGH (ref 5–15)
Anion gap: 14 (ref 5–15)
BUN: 83 mg/dL — ABNORMAL HIGH (ref 6–20)
BUN: 95 mg/dL — AB (ref 6–20)
CALCIUM: 7.6 mg/dL — AB (ref 8.9–10.3)
CHLORIDE: 95 mmol/L — AB (ref 101–111)
CO2: 21 mmol/L — ABNORMAL LOW (ref 22–32)
CO2: 24 mmol/L (ref 22–32)
CREATININE: 5.38 mg/dL — AB (ref 0.61–1.24)
CREATININE: 5.64 mg/dL — AB (ref 0.61–1.24)
Calcium: 8.1 mg/dL — ABNORMAL LOW (ref 8.9–10.3)
Chloride: 94 mmol/L — ABNORMAL LOW (ref 101–111)
GFR calc Af Amer: 11 mL/min — ABNORMAL LOW (ref >60)
GFR, EST AFRICAN AMERICAN: 12 mL/min — AB (ref >60)
GFR, EST NON AFRICAN AMERICAN: 10 mL/min — AB (ref >60)
GFR, EST NON AFRICAN AMERICAN: 10 mL/min — AB (ref >60)
Glucose, Bld: 182 mg/dL — ABNORMAL HIGH (ref 65–99)
Glucose, Bld: 178 mg/dL — ABNORMAL HIGH (ref 65–99)
PHOSPHORUS: 6.5 mg/dL — AB (ref 2.5–4.6)
Phosphorus: 6.6 mg/dL — ABNORMAL HIGH (ref 2.5–4.6)
Potassium: 5 mmol/L (ref 3.5–5.1)
Potassium: 5.2 mmol/L — ABNORMAL HIGH (ref 3.5–5.1)
SODIUM: 131 mmol/L — AB (ref 135–145)
Sodium: 133 mmol/L — ABNORMAL LOW (ref 135–145)

## 2016-04-19 LAB — BASIC METABOLIC PANEL
ANION GAP: 14 (ref 5–15)
BUN: 95 mg/dL — ABNORMAL HIGH (ref 6–20)
CHLORIDE: 95 mmol/L — AB (ref 101–111)
CO2: 24 mmol/L (ref 22–32)
CREATININE: 5.65 mg/dL — AB (ref 0.61–1.24)
Calcium: 8 mg/dL — ABNORMAL LOW (ref 8.9–10.3)
GFR calc non Af Amer: 10 mL/min — ABNORMAL LOW (ref >60)
GFR, EST AFRICAN AMERICAN: 11 mL/min — AB (ref >60)
Glucose, Bld: 175 mg/dL — ABNORMAL HIGH (ref 65–99)
POTASSIUM: 5.2 mmol/L — AB (ref 3.5–5.1)
Sodium: 133 mmol/L — ABNORMAL LOW (ref 135–145)

## 2016-04-19 LAB — POCT I-STAT 3, ART BLOOD GAS (G3+)
Acid-base deficit: 4 mmol/L — ABNORMAL HIGH (ref 0.0–2.0)
Bicarbonate: 24.5 mmol/L (ref 20.0–28.0)
O2 Saturation: 97 %
PCO2 ART: 55.5 mmHg — AB (ref 32.0–48.0)
PH ART: 7.253 — AB (ref 7.350–7.450)
PO2 ART: 113 mmHg — AB (ref 83.0–108.0)
TCO2: 26 mmol/L (ref 0–100)

## 2016-04-19 LAB — HEPATIC FUNCTION PANEL
ALK PHOS: 101 U/L (ref 38–126)
ALT: 47 U/L (ref 17–63)
AST: 47 U/L — ABNORMAL HIGH (ref 15–41)
Albumin: 1.8 g/dL — ABNORMAL LOW (ref 3.5–5.0)
BILIRUBIN DIRECT: 0.7 mg/dL — AB (ref 0.1–0.5)
BILIRUBIN INDIRECT: 0.9 mg/dL (ref 0.3–0.9)
Total Bilirubin: 1.6 mg/dL — ABNORMAL HIGH (ref 0.3–1.2)
Total Protein: 5.2 g/dL — ABNORMAL LOW (ref 6.5–8.1)

## 2016-04-19 LAB — DIC (DISSEMINATED INTRAVASCULAR COAGULATION)PANEL
D-Dimer, Quant: 1.12 ug/mL-FEU — ABNORMAL HIGH (ref 0.00–0.50)
Fibrinogen: >800 mg/dL — ABNORMAL HIGH (ref 210–475)
INR: 2.84
Platelets: 20 10*3/uL — CL (ref 150–400)
Prothrombin Time: 30.4 seconds — ABNORMAL HIGH (ref 11.4–15.2)

## 2016-04-19 LAB — LACTIC ACID, PLASMA
LACTIC ACID, VENOUS: 1.3 mmol/L (ref 0.5–1.9)
Lactic Acid, Venous: 1.5 mmol/L (ref 0.5–1.9)

## 2016-04-19 LAB — TROPONIN I
Troponin I: 0.71 ng/mL (ref <0.03)
Troponin I: 1.54 ng/mL (ref <0.03)

## 2016-04-19 LAB — PROTIME-INR
INR: 5.49 — AB
Prothrombin Time: 51.5 seconds — ABNORMAL HIGH (ref 11.4–15.2)

## 2016-04-19 LAB — GLUCOSE, CAPILLARY
GLUCOSE-CAPILLARY: 173 mg/dL — AB (ref 65–99)
GLUCOSE-CAPILLARY: 225 mg/dL — AB (ref 65–99)
GLUCOSE-CAPILLARY: 190 mg/dL — AB (ref 65–99)

## 2016-04-19 LAB — PHOSPHORUS: PHOSPHORUS: 6.5 mg/dL — AB (ref 2.5–4.6)

## 2016-04-19 LAB — TYPE AND SCREEN
ABO/RH(D): O NEG
ANTIBODY SCREEN: NEGATIVE

## 2016-04-19 LAB — MAGNESIUM: Magnesium: 2.1 mg/dL (ref 1.7–2.4)

## 2016-04-19 LAB — CORTISOL: Cortisol, Plasma: >100 ug/dL

## 2016-04-19 MED ORDER — LIDOCAINE HCL (PF) 1 % IJ SOLN: 5.0000 mL | INTRAMUSCULAR | Status: DC | PRN

## 2016-04-19 MED ORDER — ORAL CARE MOUTH RINSE
15.0000 mL | Freq: Four times a day (QID) | OROMUCOSAL | Status: DC
  Administered 2016-04-20 – 2016-04-27 (×30): 15 mL via OROMUCOSAL

## 2016-04-19 MED ORDER — MIDAZOLAM HCL 2 MG/2ML IJ SOLN
1.0000 mg | INTRAMUSCULAR | Status: AC | PRN
  Administered 2016-04-19 (×3): 1 mg via INTRAVENOUS
  Filled 2016-04-19 (×2): qty 2

## 2016-04-19 MED ORDER — MIDAZOLAM HCL 2 MG/2ML IJ SOLN
1.0000 mg | INTRAMUSCULAR | Status: DC | PRN
  Administered 2016-04-20 – 2016-04-21 (×4): 1 mg via INTRAVENOUS
  Filled 2016-04-19 (×5): qty 2

## 2016-04-19 MED ORDER — DEXTROSE 5 % IV SOLN
2.0000 ug/min | INTRAVENOUS | Status: DC
  Administered 2016-04-19: 4 ug/min via INTRAVENOUS
  Filled 2016-04-19 (×2): qty 4

## 2016-04-19 MED ORDER — METOPROLOL TARTRATE 12.5 MG HALF TABLET
12.5000 mg | ORAL_TABLET | Freq: Two times a day (BID) | ORAL | Status: DC
  Filled 2016-04-19: qty 1

## 2016-04-19 MED ORDER — LIDOCAINE-PRILOCAINE 2.5-2.5 % EX CREA
1.0000 "application " | TOPICAL_CREAM | CUTANEOUS | Status: DC | PRN
  Filled 2016-04-19: qty 5

## 2016-04-19 MED ORDER — SODIUM CHLORIDE 0.9 % IV SOLN: 100.0000 mL | INTRAVENOUS | Status: DC | PRN

## 2016-04-19 MED ORDER — ALTEPLASE 2 MG IJ SOLR
2.0000 mg | Freq: Once | INTRAMUSCULAR | Status: DC | PRN
  Filled 2016-04-19: qty 2

## 2016-04-19 MED ORDER — PENTAFLUOROPROP-TETRAFLUOROETH EX AERO: 1.0000 "application " | INHALATION_SPRAY | CUTANEOUS | Status: DC | PRN

## 2016-04-19 MED ORDER — MIDAZOLAM HCL 2 MG/2ML IJ SOLN
INTRAMUSCULAR | Status: AC
  Administered 2016-04-19: 2 mg
  Filled 2016-04-19: qty 2

## 2016-04-19 MED ORDER — NOREPINEPHRINE BITARTRATE 1 MG/ML IV SOLN
2.0000 ug/min | INTRAVENOUS | Status: DC
  Administered 2016-04-20: 27 ug/min via INTRAVENOUS
  Administered 2016-04-20: 18 ug/min via INTRAVENOUS
  Administered 2016-04-21: 10 ug/min via INTRAVENOUS
  Administered 2016-04-23: 4 ug/min via INTRAVENOUS
  Administered 2016-04-26 – 2016-04-27 (×2): 5 ug/min via INTRAVENOUS
  Filled 2016-04-19 (×6): qty 16

## 2016-04-19 MED ORDER — INSULIN ASPART 100 UNIT/ML ~~LOC~~ SOLN
0.0000 [IU] | SUBCUTANEOUS | Status: DC
  Administered 2016-04-19 – 2016-04-20 (×6): 2 [IU] via SUBCUTANEOUS
  Administered 2016-04-20: 3 [IU] via SUBCUTANEOUS

## 2016-04-19 MED ORDER — FENTANYL CITRATE (PF) 100 MCG/2ML IJ SOLN
INTRAMUSCULAR | Status: AC
  Administered 2016-04-19: 100 ug
  Filled 2016-04-19: qty 2

## 2016-04-19 MED ORDER — CHLORHEXIDINE GLUCONATE 0.12% ORAL RINSE (MEDLINE KIT)
15.0000 mL | Freq: Two times a day (BID) | OROMUCOSAL | Status: DC
  Administered 2016-04-19 – 2016-04-27 (×16): 15 mL via OROMUCOSAL

## 2016-04-19 MED ORDER — PIPERACILLIN-TAZOBACTAM 3.375 G IVPB
3.3750 g | Freq: Two times a day (BID) | INTRAVENOUS | Status: DC
Start: 2016-04-19 — End: 2016-04-20
  Administered 2016-04-20: 3.375 g via INTRAVENOUS
  Filled 2016-04-19 (×4): qty 50

## 2016-04-19 MED ORDER — VANCOMYCIN HCL IN DEXTROSE 1-5 GM/200ML-% IV SOLN: 1000.0000 mg | INTRAVENOUS | Status: DC

## 2016-04-19 MED ORDER — FENTANYL CITRATE (PF) 100 MCG/2ML IJ SOLN
100.0000 ug | INTRAMUSCULAR | Status: AC | PRN
  Administered 2016-04-19 (×3): 100 ug via INTRAVENOUS
  Filled 2016-04-19 (×4): qty 2

## 2016-04-19 MED ORDER — HEPARIN SODIUM (PORCINE) 1000 UNIT/ML DIALYSIS
40.0000 [IU]/kg | Freq: Once | INTRAMUSCULAR | Status: DC
  Filled 2016-04-19: qty 6

## 2016-04-19 MED ORDER — SODIUM CHLORIDE 0.9 % IV SOLN: Freq: Once | INTRAVENOUS | Status: DC

## 2016-04-19 MED ORDER — SODIUM CHLORIDE 0.9 % IV BOLUS (SEPSIS): 250.0000 mL | Freq: Once | INTRAVENOUS | Status: DC

## 2016-04-19 MED ORDER — PHYTONADIONE 5 MG PO TABS
2.5000 mg | ORAL_TABLET | Freq: Once | ORAL | Status: DC
  Filled 2016-04-19: qty 1

## 2016-04-19 MED ORDER — FENTANYL CITRATE (PF) 100 MCG/2ML IJ SOLN
100.0000 ug | INTRAMUSCULAR | Status: DC | PRN
  Administered 2016-04-19 – 2016-04-20 (×4): 100 ug via INTRAVENOUS
  Filled 2016-04-19 (×3): qty 2

## 2016-04-19 MED ORDER — VANCOMYCIN HCL 10 G IV SOLR
2500.0000 mg | Freq: Once | INTRAVENOUS | Status: DC
  Filled 2016-04-19: qty 2500

## 2016-04-19 MED ORDER — PHYTONADIONE 5 MG PO TABS
10.0000 mg | ORAL_TABLET | Freq: Once | ORAL | Status: DC
  Filled 2016-04-19: qty 2

## 2016-04-19 MED ORDER — VANCOMYCIN HCL 10 G IV SOLR
2000.0000 mg | Freq: Once | INTRAVENOUS | Status: DC
  Filled 2016-04-19: qty 2000

## 2016-04-19 MED ORDER — HEPARIN SODIUM (PORCINE) 1000 UNIT/ML DIALYSIS: 1000.0000 [IU] | INTRAMUSCULAR | Status: DC | PRN

## 2016-04-19 MED ORDER — SODIUM CHLORIDE 0.9 % IV BOLUS (SEPSIS)
500.0000 mL | Freq: Once | INTRAVENOUS | Status: AC
  Administered 2016-04-19: 500 mL via INTRAVENOUS

## 2016-04-19 MED ORDER — VITAMIN K1 10 MG/ML IJ SOLN
2.5000 mg | Freq: Once | INTRAVENOUS | Status: AC
  Administered 2016-04-19: 2.5 mg via INTRAVENOUS
  Filled 2016-04-19: qty 0.25

## 2016-04-19 NOTE — Plan of Care (Signed)
Problem: Nutrition: Goal: Dietary intake will improve Outcome: Not Progressing NPO d/t failed swallow study with SLP

## 2016-04-19 NOTE — Procedures (Signed)
Central Venous Catheter Insertion Procedure Note Johnny Sims [id number] 04-10-1956  Procedure: Insertion of Central Venous Catheter Indications: Assessment of intravascular volume, Drug and/or fluid administration and Frequent blood sampling  Procedure Details Consent: Risks of procedure as well as the alternatives and risks of each were explained to the (patient/caregiver).  Consent for procedure obtained. Time Out: Verified patient identification, verified procedure, site/side was marked, verified correct patient position, special equipment/implants available, medications/allergies/relevent history reviewed, required imaging and test results available.  Performed  Maximum sterile technique was used including antiseptics, cap, gloves, gown, hand hygiene, mask and sheet. Skin prep: Chlorhexidine; local anesthetic administered A antimicrobial bonded/coated triple lumen catheter was placed in the left internal jugular vein using the Seldinger technique. Ultrasound guidance used.Yes.   Catheter placed to 20 cm. Blood aspirated via all 3 ports and then flushed x 3. Line sutured x 2 and dressing applied.  Evaluation Blood flow good Complications: No apparent complications Patient did tolerate procedure well. Chest X-Eual ordered to verify placement.  CXR: pending.  Georgann Housekeeper, AGACNP-BC [facility] Pager [phone] or [phone]  04/19/2016 4:57 PM  Rush Farmer, M.D. [facility] Pulmonary/Critical Care Medicine. Pager: [phone]. After hours pager: [phone].

## 2016-04-19 NOTE — Progress Notes (Signed)
OT Cancellation Note  Patient Details Name: Johnny Sims MRN: [id number] DOB: 04-29-1956   Cancelled Treatment:    Reason Eval/Treat Not Completed: Fatigue/lethargy limiting ability to participate. Pt confused, resistant to being touched, eyes primarily closed. Pt not able to participate in evaluation. Will follow.  Malka So 04/19/2016, 1:39 PM

## 2016-04-19 NOTE — Consult Note (Signed)
PULMONARY / CRITICAL CARE MEDICINE   Name: Johnny Sims MRN: [id number] DOB: 1956/04/26    ADMISSION DATE:  04/19/2016 CONSULTATION DATE:  04/19/2016  REFERRING MD:  Dr. Thayer Headings  CHIEF COMPLAINT:  AMS now hypotension  HISTORY OF PRESENT ILLNESS:   60 year old male with PMH as below, which is significant for ESRD on HD TTS, DM, COPD, PAF on warfarin, and dCHF. He was recently 10/2 admitted for hypervolemia in the setting of progression of his CKD at which point HD was initiated through newly placed tunneled catheter. Once euvolemic (4 HD treatments) he was discharged 10/7. 10/19 he presented to ED post fall and was found to have INR elevation, but with the exception with some bruising he was otherwise stable and was discharged. 10/21 he again presented to [facility] ED, this time with altered mental status and slurred speech.  There was concern for CVA as he presented with AF-RVR. But INR found to be elevated at 8.  MRI demonstrated tiny R frontal lobe infarct. Not clear whether this is entirely the cause of his altered mental status. There was also concern for sepsis in setting of leukopenia and CXR infiltrate so he was started on cefepime and vancomycin. Initial blood cultures started to grow GNR and tunneled HD cath was pulled 10/22. Turned out to be pseudomonas putida pan-sensitive. Repeat cultures on 10/23 with MRSA. ABX remain Vancomycin and Zosyn. TTE did not describe vegetation. 10/24 he became hypotensive and airway protection worsened. PCCM asked to see.   PAST MEDICAL HISTORY :  He  has a past medical history of Anxiety; Asthma; Bipolar 1 disorder ([place]); Chronic pain; CKD (chronic kidney disease), stage II; COPD (chronic obstructive pulmonary disease) ([place]); Diabetes mellitus; Fibromyalgia; GERD (gastroesophageal reflux disease); Hepatitis C; Hepatitis C; Hyperlipidemia; Hypertension; Hypothyroidism; Melanoma ([place]); OA (osteoarthritis); PAF (paroxysmal atrial fibrillation) ([place]);  Pancreatitis, acute (2015); RA (rheumatoid arthritis) ([place]); and Venous stasis.  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  Allergies  Allergen Reactions  . Hydrocodone-Acetaminophen Palpitations and Other (See Comments)    Doesn't want to take  . Iodides Other (See Comments)    Patient states he doesn't know what iodine is and doesn't think he is allergic to it despite it being listed with his allergies  . Other Hives and Other (See Comments)    Steroid creams "Corndogs"- Nausea & vomiting   . Sulfacetamide Sodium Hives and Itching  . Tylenol [Acetaminophen] Nausea And Vomiting  . Gabapentin Swelling    SWELLING REACTION UNSPECIFIED   . Antihistamines, Chlorpheniramine-Type Other (See Comments)    unknown  . Hydralazine Rash  . Iodinated Diagnostic Agents Other (See Comments)    Patient states he doesn't know what iodine is and doesn't think he is allergic to it despite it being listed with his allergies  . Latex Rash  . Lidocaine Itching and Other (See Comments)    Patient is uncertain of this allergy  . Pentazocine Other (See Comments)    unknown  . Pheniramine Other (See Comments)    unknown  . Sulfa Antibiotics Itching and Other (See Comments)  . Tape Rash  . Vicodin [Hydrocodone-Acetaminophen] Palpitations and Other (See Comments)  Doesn't want to take    No current facility-administered medications on file prior to encounter.    Current Outpatient Prescriptions on File Prior to Encounter   Medication Sig  . albuterol (PROVENTIL) (2.5 MG/3ML) 0.083% nebulizer solution Take 2.5 mg by nebulization 4 (four) times daily.  . calcium acetate (PHOSLO) 667 MG capsule Take 2 capsules (1,334 mg total) by mouth 3 (three) times daily with meals.  . cholecalciferol (VITAMIN D) 1000 units tablet Take 1,000 Units by mouth daily.  Marland Kitchen COUMADIN 10 MG tablet Take as directed by the Coumadin clinic. (Patient taking differently: Take 10 mg by mouth See admin instructions. Mon/Tues/Wed/Fri/Sat (in the evening))  . COUMADIN 7.5 MG tablet Take as directed by Coumadin clinic. (Patient taking differently: Take 7.5 mg by mouth. Sun/Thurs (in the evening))  . Darbepoetin Alfa (ARANESP) 100 MCG/0.5ML SOSY injection Inject 0.5 mLs (100 mcg total) into the vein every Tuesday with hemodialysis.  Marland Kitchen diazepam (VALIUM) 10 MG tablet Take 10 mg by mouth 4 (four) times daily.   . hydrALAZINE (APRESOLINE) 50 MG tablet Take 1 tablet (50 mg total) by mouth 3 (three) times daily.  . Insulin Glargine (LANTUS SOLOSTAR) 100 UNIT/ML Solostar Pen Inject 30 Units into the skin daily at 10 pm.  . levothyroxine (SYNTHROID) 88 MCG tablet Take 1 tablet (88 mcg total) by mouth daily before breakfast. Will send additional refills based off blood level (Patient taking differently: Take 88 mcg by mouth daily before breakfast. )  . metoprolol (LOPRESSOR) 100 MG tablet Take 1 tablet (100 mg total) by mouth 2 (two) times daily. (Patient taking differently: Take 100-200 mg by mouth 2 (two) times daily. Take 2 tablets in the morning, and 1 tablet in the evening)  . multivitamin (RENA-VIT) TABS tablet Take 1 tablet by mouth at bedtime.  . nicotine (NICODERM CQ - DOSED IN MG/24 HOURS) 14 mg/24hr patch Place 14 mg onto the skin daily.  Marland Kitchen omeprazole (PRILOSEC) 20 MG capsule Take 1 capsule (20 mg total) by mouth daily. Take with [place] at the same time under FASTING condition (Patient taking differently: Take 20 mg by mouth daily. )  . oxyCODONE  (ROXICODONE) 15 MG immediate release tablet Take 1 tablet (15 mg total) by mouth every 3 (three) hours as needed for pain.  . simvastatin (ZOCOR) 20 MG tablet Take 1 tablet (20 mg total) by mouth daily at 6 PM.  . SPIRIVA HANDIHALER 18 MCG inhalation capsule INHALE CONTENTS OF ONE CAPSULE ONCE A DAY (Patient taking differently: INHALE CONTENTS OF ONE CAPSULE twice A DAY)  . psyllium (HYDROCIL/METAMUCIL) 95 % PACK Take 1 packet by mouth daily. (Patient not taking: Reported on 03/26/2016)  . [DISCONTINUED] Insulin Glulisine (APIDRA) 100 UNIT/ML Solostar Pen Inject 28 Units into the skin at bedtime.    FAMILY HISTORY:  His indicated that his mother is alive. He indicated that his father is deceased. He indicated that his brother is alive.    SOCIAL HISTORY: He  reports that he has been smoking Cigarettes.  He has a 43.00 pack-year smoking history. He has never used smokeless tobacco. He reports that he drinks alcohol. He reports that he does not use drugs.  REVIEW OF SYSTEMS:   unable  SUBJECTIVE:    VITAL SIGNS: BP (!) 75/52 (BP Location: Right Arm)   Pulse (!) 104   Temp 97.1 F (36.2 C) (Axillary)   Resp 18   Ht 6\' 2"  (1.88 m)   Wt 131.4 kg (289 lb 9.6 oz)   SpO2  94%   BMI 37.18 kg/m   HEMODYNAMICS:    VENTILATOR SETTINGS:    INTAKE / OUTPUT: I/O last 3 completed shifts: In: 417.4 [I.V.:317.4; IV Piggyback:100] Out: 225 [Urine:225]  PHYSICAL EXAMINATION: General:  Elderly male in distress with gurgling secretions Neuro:  Alert, hard to determine orientation with distress HEENT:  [place]/AT, PERRL Cardiovascular:  RRR, no MRG Lungs:  Coarse breath sounds and referred upper airway sounds Abdomen:  Soft non-tender Musculoskeletal: No acute deformity Skin:  Grossly intact.   LABS:  BMET  Recent Labs Lab 04/17/16 0138 04/18/16 0517 04/19/16 0259  NA 134* 134* 131*  K 4.3 4.9 5.0  CL 96* 95* 94*  CO2 23 25 21*  BUN 39* 63* 83*  CREATININE 4.00* 4.99* 5.38*   GLUCOSE 76 142* 182*    Electrolytes  Recent Labs Lab 04/17/16 0138 04/18/16 0517 04/19/16 0259  CALCIUM 7.6* 7.7* 7.6*  PHOS 5.4* 6.6* 6.5*    CBC  Recent Labs Lab 04/17/16 0138 04/18/16 0503 04/19/16 0259 04/19/16 1038  WBC 4.9 10.9* 14.6*  --   HGB 9.4* 9.0* 8.5*  --   HCT 29.8* 28.3* 26.2*  --   PLT 61* 34* 28* 20*    Coag's  Recent Labs Lab 04/18/16 0503 04/19/16 0259 04/19/16 1038  APTT  --   --  51*  INR 3.20 5.49* 2.84    Sepsis Markers  Recent Labs Lab 04/19/2016 0822 04/10/2016 1001 04/25/2016 1243  LATICACIDVEN 2.97* 3.4* 2.4*    ABG No results for input(s): PHART, PCO2ART, PO2ART in the last 168 hours.  Liver Enzymes  Recent Labs Lab 04/24/2016 0433  04/18/16 0517 04/19/16 0259 04/19/16 1038  AST 42*  --   --   --  47*  ALT 29  --   --   --  47  ALKPHOS 63  --   --   --  101  BILITOT 1.1  --   --   --  1.6*  ALBUMIN 2.8*  < > 2.0* 1.8* 1.8*  < > = values in this interval not displayed.  Cardiac Enzymes No results for input(s): TROPONINI, PROBNP in the last 168 hours.  Glucose  Recent Labs Lab 04/18/16 0852 04/18/16 1303 04/18/16 1718 04/18/16 2052 04/19/16 0817 04/19/16 1255  GLUCAP 132* 143* 130* 151* 173* 225*    Imaging No results found.   STUDIES:  Ct head 10/21 > RIGHT facial contusion. No acute intracranial process. Mild chronic small vessel ischemic Disease. 19 mm LEFT thyroid nodule for which follow up thyroid sonogram is recommended on a nonemergent basis.  CULTURES: BCx2 10/21 > Pseudomonas putida BCx2 10/23 > MRSA >>> Catheter tip 10/22 > NGTD >>>  ANTIBIOTICS: Zosyn 10/21 >>> Vancomycin 10/21 >>>  SIGNIFICANT EVENTS:   LINES/TUBES: ETT 10/24 >  DISCUSSION: 60 year old male with newly converted ESRD. Admitted 10/21 with AMS and found to have small CVA and now bacteremia likely from tunneled HD cath. On broad spectrum antibiotics, but clinical status deteriorated. 10/24 transferred to ICU for  intubation, pressors.   ASSESSMENT / PLAN:  PULMONARY A: Acute hypoxemic respiratory failure secondary to aspiration PNA COPD without acute exacerbation  P:   STAT intubation CXR ABG VAP bundle  CARDIOVASCULAR A:  Shock - likely septic vs cardiogenic with severe MR PAF on warfarin H/o hypotension, HLD  P:  Telemetry monitoring MAP goal > 56mmHg KVO IVF as he appears volume overloaded Levophed infusion for MAP goal TEE pending Holding home antihypertensives  RENAL A:  ESRD on HD TTS  P:   Currently no HD access, appears hypervolemic Nephrology following  Will place access once coags improve  GASTROINTESTINAL A:   Hepatitis C GERD  P:   NPO LFTs  Pepcid daily for SUP  HEMATOLOGIC A:   Thrombocytopenia, severe Warfarin coagulopathy Anemia  P:  Holding coumadin and anticoagulation altogether. Trend CBC and coags Transfuse for Hgb < 7  INFECTIOUS A:   Bacteremia MRSA and Pseudomonas Putida Likely aspiration PNA Need to rule out infective endocarditis  P:   Continue Zosyn and Vancomycin  Continue to follow cultures  ENDOCRINE A:   DM Hypothyroid with low TSH  P:   CBG monitoring and SSI  NEUROLOGIC A:   Acute metabolic encephalopathy CVA Bipolar disorder P:   RASS goal: 0 to -1 Intermittent fentanyl/versed Stroke team following   FAMILY  - Updates: Brother updated  - Inter-disciplinary family meet or Palliative Care meeting due by: 10/31   Georgann Housekeeper, AGACNP-BC [place] Pulmonology/Critical Care Pager [phone] or [phone]  04/19/2016 4:22 PM  Attending Note:  60 year old male with extensive PMH including renal disease and hepatitis C presenting post fall and bacteremia, worsening renal failure and now with severe altered mental status and septic shock.  On exam, the patient is clearly not protecting his airway and clearly aspirated.  Coarse BS diffusely.  I reviewed CXR myself, cardiomegally and infiltrate  noted.  Spoke with brother, full code status confirmed.  Will transfer patient to the ICU, intubate, place central line and OGT, begin TF, maintain sedated, begin pressors.  Continue vanc and zosyn, pseudomonas is pan sensitive.  Will continue zosyn.  Continue vitamin K.  Stress dose steroids ordered as well as check cortisol level.  Prognosis is quite grim given multiple organs failing.  The patient is critically ill with multiple organ systems failure and requires high complexity decision making for assessment and support, frequent evaluation and titration of therapies, application of advanced monitoring technologies and extensive interpretation of multiple databases.   Critical Care Time devoted to patient care services described in this note is  60  Minutes. This time reflects time of care of this signee Dr [name]. This critical care time does not reflect procedure time, or teaching time or supervisory time of PA/NP/Med student/Med Resident etc but could involve care discussion time.  Rush Farmer, M.D. [facility] Pulmonary/Critical Care Medicine. Pager: [phone]. After hours pager: [phone].

## 2016-04-19 NOTE — Progress Notes (Signed)
Interim Progress Note:  Noted troponin from 0.71 to 1.54. Patient is currently in septic shock requiring pressor, is ESRD, and has had Afib with RVR with rates now better controlled. EKG obtained earlier this evening did not show signs of ischemia. Per chart review, he has know history of MI or L/R heart cath but does have risk factors. His troponin elevation could be demand ischemia in the setting of ESRD. Discussed with cards fellow on call. Patient currently has severe thrombocytopenia therefore IV Heparin is contraindicated. Cards fellow agrees to continue monitoring for now; if patient's shock and respiratory failure improves then would possibly consider cath. Of note, Cardiology is already consulted and following for this patient.   Smiley Houseman, MD PGY 2 Family Medicine

## 2016-04-19 NOTE — Progress Notes (Signed)
CRITICAL VALUE ALERT  Critical value received:  Platelets 28  Date of notification:  04/19/16   Time of notification:  0513  Critical value read back:Yes.    Nurse who received alert:  Reap, Jon Gills   MD notified (1st page):  IMTS intern pager  Time of first page:  5:19 AM   MD notified (2nd page):  Time of second page:  Responding MD:    Time MD responded:

## 2016-04-19 NOTE — Progress Notes (Signed)
ANTICOAGULATION CONSULT NOTE - Follow Up Consult  Pharmacy Consult for Coumadin  Indication: h/o Afib (Supratherapeutic INR on admit); New/acute CVA this admission  Allergies  Allergen Reactions  . Hydrocodone-Acetaminophen Palpitations and Other (See Comments)    Doesn't want to take  . Iodides Other (See Comments)    Patient states he doesn't know what iodine is and doesn't think he is allergic to it despite it being listed with his allergies  . Other Hives and Other (See Comments)    Steroid creams "Corndogs"- Nausea & vomiting   . Sulfacetamide Sodium Hives and Itching  . Tylenol [Acetaminophen] Nausea And Vomiting  . Gabapentin Swelling    SWELLING REACTION UNSPECIFIED   . Antihistamines, Chlorpheniramine-Type Other (See Comments)    unknown  . Hydralazine Rash  . Iodinated Diagnostic Agents Other (See Comments)    Patient states he doesn't know what iodine is and doesn't think he is allergic to it despite it being listed with his allergies  . Latex Rash  . Lidocaine Itching and Other (See Comments)    Patient is uncertain of this allergy  . Pentazocine Other (See Comments)    unknown  . Pheniramine Other (See Comments)    unknown  . Sulfa Antibiotics Itching and Other (See Comments)  . Tape Rash  . Vicodin [Hydrocodone-Acetaminophen] Palpitations and Other (See Comments)    Doesn't want to take    Patient Measurements: Height: 6\' 2"  (188 cm) Weight: 289 lb 9.6 oz (131.4 kg) IBW/kg (Calculated) : 82.2 Heparin Dosing Weight: 110 kg  Vital Signs: Temp: 97.6 F (36.4 C) (10/23 2311) Temp Source: Oral (10/23 2311) BP: 89/61 (10/24 0630) Pulse Rate: 118 (10/24 0804)  Labs:  Recent Labs  04/17/16 0138 04/18/16 0503 04/18/16 0517 04/19/16 0259  HGB 9.4* 9.0*  --  8.5*  HCT 29.8* 28.3*  --  26.2*  PLT 61* 34*  --  28*  LABPROT 64.0* 33.5*  --  51.5*  INR 7.19* 3.20  --  5.49*  CREATININE 4.00*  --  4.99* 5.38*    Estimated Creatinine Clearance: 21 mL/min  (by C-G formula based on SCr of 5.38 mg/dL (H)).   Medications:  Scheduled:  . calcitRIOL  0.5 mcg Oral Q T,Th,Sa-HD  . calcium acetate  2,001 mg Oral TID WC  . ceFEPime (MAXIPIME) IV  1 g Intravenous Q24H  . [START ON 04/21/2016] darbepoetin (ARANESP) injection - DIALYSIS  200 mcg Intravenous Q Thu-HD  . digoxin  0.125 mg Oral Daily  . ferric gluconate (FERRLECIT/NULECIT) IV  125 mg Intravenous Q T,Th,Sa-HD  . insulin aspart  0-5 Units Subcutaneous QHS  . insulin aspart  0-9 Units Subcutaneous TID WC  . insulin glargine  15 Units Subcutaneous QHS  . levothyroxine  88 mcg Oral QAC breakfast  . metoprolol tartrate  50 mg Oral BID  . multivitamin  1 tablet Oral QHS  . nicotine  14 mg Transdermal Daily  . pantoprazole  40 mg Oral Daily  . sodium chloride flush  3 mL Intravenous Q12H  . tiotropium  18 mcg Inhalation Daily  . Warfarin - Pharmacist Dosing Inpatient   Does not apply q1800    Assessment: 60 y.o. male admitted on 04/21/2016 with Acute CVA per MRI brain [id number], possibly embolic 2/2 afib. On Coumadin 10mg  daily exc for 7.5mg  on Sun/Thurs. INR on admit was 5.93 s/p fall. Given Vit K on 10/21. INR started to trend down but now INR is elevated at 5.49 today even after no Coumadin doses.  Hgb low at 8.5, plts down to 28. Will need to have discussion of risk vs benefit of Coumadin with such low plts.  Goal of Therapy:  INR 2-3 Monitor platelets by anticoagulation protocol: Yes   Plan:  Continue to hold Coumadin tonight Monitor daily INR, plts closely, s/s of bleed F/U need for anticoag with plts < [place], PharmD, [facility] Clinical Pharmacist Pager [phone] 04/19/2016 8:31 AM

## 2016-04-19 NOTE — Procedures (Signed)
Intubation Procedure Note Johnny Sims [id number] [birth year]  Procedure: Intubation Indications: Respiratory insufficiency  Procedure Details Consent: Unable to obtain consent because of emergent medical necessity. Time Out: Verified patient identification, verified procedure, site/side was marked, verified correct patient position, special equipment/implants available, medications/allergies/relevent history reviewed, required imaging and test results available.  Performed  Maximum sterile technique was used including gloves, hand hygiene and mask.  MAC    Evaluation Hemodynamic Status: BP stable throughout; O2 sats: stable throughout Patient's Current Condition: stable Complications: No apparent complications Patient did tolerate procedure well. Chest X-Arlow ordered to verify placement.  CXR: pending.   Johnny Sims 04/19/2016

## 2016-04-19 NOTE — Progress Notes (Addendum)
Pharmacy Antibiotic Note  Johnny Sims is a 60 y.o. male admitted on 04/01/2016 with pan sensitive pseudomonas bacteremia. Recently started on HD earlier this month. Day #4 of treatment. Afebrile, WBC elevated 14.6.   ADDENDUM:  Discussed patient with IMTS. Seems to be clinically worsening today after 4 days of cefepime. Also worrisome for drug induced thrombocytopenia with plts down to 20 today. Will broaden coverage to Zosyn and vancomycin. Patient does also have chronic cellulitis / wounds on feet that was supposed to be amputated a while ago but never were. Already received a 2g load dose of vancomycin back on 10/21 along with a HD session. Next HD session scheduled for today.  Plan: Stop cefepime Start Zosyn 3.375g IV Q12 (4 hr infusion) Give vancomycin 2g IV x 1, then start vancomycin 1g IV QHD-TTS on 10/26 Monitor clinical picture, renal function, VT at Css F/U C&S, abx deescalation / LOT   Height: 6\' 2"  (188 cm) Weight: 289 lb 9.6 oz (131.4 kg) IBW/kg (Calculated) : 82.2  Temp (24hrs), Avg:97.6 F (36.4 C), Min:97.3 F (36.3 C), Max:97.8 F (36.6 C)   Recent Labs Lab 04/15/2016 0433 04/25/2016 0506 04/02/2016 0800 04/03/2016 0822 04/25/2016 1001 03/27/2016 1243 04/14/2016 1439 04/17/16 0138 04/18/16 0503 04/18/16 0517 04/19/16 0259  WBC 2.9*  --  1.3*  --   --   --   --  4.9 10.9*  --  14.6*  CREATININE 5.06*  --   --   --   --   --  5.58* 4.00*  --  4.99* 5.38*  LATICACIDVEN  --  2.69*  --  2.97* 3.4* 2.4*  --   --   --   --   --     Estimated Creatinine Clearance: 21 mL/min (by C-G formula based on SCr of 5.38 mg/dL (H)).    Allergies  Allergen Reactions  . Hydrocodone-Acetaminophen Palpitations and Other (See Comments)    Doesn't want to take  . Iodides Other (See Comments)    Patient states he doesn't know what iodine is and doesn't think he is allergic to it despite it being listed with his allergies  . Other Hives and Other (See Comments)    Steroid  creams "Corndogs"- Nausea & vomiting   . Sulfacetamide Sodium Hives and Itching  . Tylenol [Acetaminophen] Nausea And Vomiting  . Gabapentin Swelling    SWELLING REACTION UNSPECIFIED   . Antihistamines, Chlorpheniramine-Type Other (See Comments)    unknown  . Hydralazine Rash  . Iodinated Diagnostic Agents Other (See Comments)    Patient states he doesn't know what iodine is and doesn't think he is allergic to it despite it being listed with his allergies  . Latex Rash  . Lidocaine Itching and Other (See Comments)    Patient is uncertain of this allergy  . Pentazocine Other (See Comments)    unknown  . Pheniramine Other (See Comments)    unknown  . Sulfa Antibiotics Itching and Other (See Comments)  . Tape Rash  . Vicodin [Hydrocodone-Acetaminophen] Palpitations and Other (See Comments)    Doesn't want to take    Antimicrobials this admission:  10/21 Vancomycin >> 10/22 10/21 Cefepime >> 10/24  Dose adjustments this admission:  n/a  Microbiology results:  10/21 BCx: Pseudomonas 10/21 UCx: sent 10/21 MRSA PCR: sent 10/23 BCx: 1/2 CoNS  Thank you for allowing pharmacy to be a part of this patient's care.  Elenor Quinones, PharmD, BCPS Clinical Pharmacist Pager [phone] 04/19/2016 8:29 AM

## 2016-04-19 NOTE — Progress Notes (Signed)
Speech Language Pathology Treatment: Dysphagia  Patient Details Name: ALYAN LUEBKE MRN: [id number] DOB: Mar 24, 1956 Today's Date: 04/19/2016 Time: [id number] SLP Time Calculation (min) (ACUTE ONLY): 15 min  Assessment / Plan / Recommendation Clinical Impression  Lethargy remains primary barrier to resuming PO diet - pt arousable to participate in limited therapy; intelligibility impaired, but improved after oral care and after consumption of POs.  Thin liquids elicit immediate and consistent coughing, concerning for aspiration.  Improved labial seal, lingual control today.  Consumed 2 oz applesauce functionally with better oral control and no overt indications of aspiration.  Max cues needed, however, to open eyes, engage.  For today, recommend allowing ice chips and meds whole in puree after oral care. SLP will continue to follow toward goals.    HPI HPI: Mr. MUNTASIR BETHUNE is a 60 y.o. male with history of end-stage renal disease on hemodialysis, tobacco use, hypertension, diabetes mellitus, atrial fibrillation on Coumadin, hypertension and hyperlipidemia  presenting with recurrent falls at home and markedly elevated INR (8.72), and slurred speech.  He did not receive IV t-PA due to anticoagulation. MRI shows tiny right frontal infarct.       SLP Plan  Continue with current plan of care     Recommendations  Diet recommendations: NPO Medication Administration: Whole meds with puree Compensations: Minimize environmental distractions;Slow rate Postural Changes and/or Swallow Maneuvers: Seated upright 90 degrees   Allow ice chips after oral care             Oral Care Recommendations: Oral care QID Plan: Continue with current plan of care       GO               Mercedees Convery L. Tivis Ringer, [place] CCC/SLP Pager [phone] Juan Quam Laurice 04/19/2016, 10:54 AM

## 2016-04-19 NOTE — Progress Notes (Signed)
STROKE TEAM PROGRESS NOTE   SUBJECTIVE (INTERVAL HISTORY) No family at the bedside.  Patient in bed confused with hypophonia, dysarthria and mild respiratory distress, but afib RVR with low BP, cardiology on board. INR still high and received VitK. On HD. pending TEE to rule out endocarditis, on vanco and zosyn.     OBJECTIVE Temp:  [97.1 F (36.2 C)-97.6 F (36.4 C)] 97.1 F (36.2 C) (10/24 1247) Pulse Rate:  [59-125] 104 (10/24 1247) Cardiac Rhythm: Atrial fibrillation (10/24 0750) Resp:  [7-27] 18 (10/24 0804) BP: (75-133)/(49-102) 75/52 (10/24 1247) SpO2:  [94 %-100 %] 94 % (10/24 0804) Weight:  [289 lb 9.6 oz (131.4 kg)] 289 lb 9.6 oz (131.4 kg) (10/24 0345)  CBC:  Recent Labs Lab 04/03/2016 0433  04/18/16 0503 04/19/16 0259 04/19/16 1038  WBC 2.9*  < > 10.9* 14.6*  --   NEUTROABS 2.3  --  10.1*  --   --   HGB 10.8*  < > 9.0* 8.5*  --   HCT 33.8*  < > 28.3* 26.2*  --   MCV 91.4  < > 91.0 91.3  --   PLT 84*  < > 34* 28* 20*  < > = values in this interval not displayed.  Basic Metabolic Panel:   Recent Labs Lab 04/18/16 0517 04/19/16 0259  NA 134* 131*  K 4.9 5.0  CL 95* 94*  CO2 25 21*  GLUCOSE 142* 182*  BUN 63* 83*  CREATININE 4.99* 5.38*  CALCIUM 7.7* 7.6*  PHOS 6.6* 6.5*    Lipid Panel:     Component Value Date/Time   CHOL 68 04/17/2016 0138   TRIG 123 04/17/2016 0138   HDL 25 (L) 04/17/2016 0138   CHOLHDL 2.7 04/17/2016 0138   VLDL 25 04/17/2016 0138   LDLCALC 18 04/17/2016 0138   HgbA1c:  Lab Results  Component Value Date   HGBA1C 6.9 (H) 04/17/2016   Urine Drug Screen:     Component Value Date/Time   LABOPIA POSITIVE (A) 09/24/2014 0144   COCAINSCRNUR NONE DETECTED 09/24/2014 0144   COCAINSCRNUR POS (A) 09/20/2007 2131   LABBENZ NONE DETECTED 09/24/2014 0144   LABBENZ NEG 09/20/2007 2131   AMPHETMU NONE DETECTED 09/24/2014 0144   THCU NONE DETECTED 09/24/2014 0144   LABBARB NONE DETECTED 09/24/2014 0144      IMAGING I have  personally reviewed the radiological images below and agree with the radiology interpretations.  Dg Chest 2 View 04/23/2016 Cardiomegaly with mild congestive changes similar to prior radiograph. Superimposed pneumonia is not excluded.   Ct Head Wo Contrast 04/18/2016 CT HEAD:  RIGHT facial contusion. No acute intracranial process. Mild chronic small vessel ischemic disease.   Mr Brain Wo Contrast 04/09/2016 Tiny right frontal lobe acute infarct. No intracranial hemorrhage. Mild chronic microvascular changes.  Moderate global atrophy without hydrocephalus. Diffusion-weighted abnormality upper cervical spine/ skullbase of indeterminate etiology.  Dg Chest Port 1 View 04/17/2016 IMPRESSION: Worsening left mid and lower lung patchy airspace process concerning for developing pneumonia. Cardiomegaly with vascular congestion    Mr Virgel Paling 04/18/2016 IMPRESSION: Normal variant MRA circle of Willis without significant proximal stenosis, aneurysm, or branch vessel occlusion.   CUS - Bilateral: No significant (1-39%) ICA stenosis. Antegrade vertebral flow.   TTE  - Left ventricle: The cavity size was mildly dilated. Wall   thickness was increased in a pattern of mild LVH. Systolic   function was normal. The estimated ejection fraction was in the   range of 50% to 55%. -  Aortic valve: There was mild regurgitation. - Mitral valve: There was moderate to severe regurgitation. - Right atrium: The atrium was mildly dilated. - Pulmonary arteries: PA peak pressure: 45 mm Hg (S). - Pericardium, extracardiac: A small pericardial effusion was   identified.  TEE pending   PHYSICAL EXAM  Temp:  [97.1 F (36.2 C)-97.6 F (36.4 C)] 97.1 F (36.2 C) (10/24 1247) Pulse Rate:  [59-125] 104 (10/24 1247) Resp:  [7-27] 18 (10/24 0804) BP: (75-133)/(49-102) 75/52 (10/24 1247) SpO2:  [94 %-100 %] 94 % (10/24 0804) Weight:  [289 lb 9.6 oz (131.4 kg)] 289 lb 9.6 oz (131.4 kg) (10/24 0345)  General  - Well nourished, well developed, mild respiratory distress  Ophthalmologic - Fundi not visualized due to noncooperation.  Cardiovascular - irregularly irregular heart rate and rhythm  Skin - ecchymosis at right face and right chest wall  Neuro - sleepy drowsy and psychomotor slowing. Severe dysarthria and hypophonia. Orientated to name and place, but not to time or age. Following simple commands intermittently. PERRL, incomplete horizontal gaze bilaterally, visual field intact, facial weakness bilaterally, tongue in middle. Bilateral UE 4/5, with asterixis, FTN intact but slow. Bilateral LE 2/5 proximal and 0/5 distal, with pain stimulation, BLE mild withdraw. DTR 1+ and no babinski. Sensation symmetrical. Gait not tested.    ASSESSMENT/PLAN Johnny Sims is a 60 y.o. male with history of end-stage renal disease on hemodialysis, tobacco use, hypertension, diabetes mellitus, atrial fibrillation on Coumadin, hypertension and hyperlipidemia  presenting with recurrent falls at home and markedly elevated INR (8.72), and slurred speech.  He did not receive IV t-PA due to anticoagulation.  Stroke:  Tiny right frontal lobe infarct, need to rule out endocarditis. Infarct less likely due to afib as high INR levels, although stroke can be due to previous subtherapeutic INR  Resultant  lethargy  MRI - tiny right frontal lobe acute infarct.   MRA - unremarkable  Carotid Doppler unremarkable  2D Echo  EF 50-55%  Pending TEE to rule out endocarditis.  LDL - 18  HgbA1c 6.9  VTE prophylaxis - INR 7.19 - > 3.20 -> 5.49 Diet NPO time specified  warfarin daily prior to admission, now warfarin on hold due to high INR  Patient counseled to be compliant with his antithrombotic medications  Ongoing aggressive stroke risk factor management  Therapy recommendations:  pending  Disposition:  Pending  Bacteremia  2/2 BCx showed G- rods  Repeat Cx 1/2 G+ cocci in cluster  On vanco and zosyn  now  ID on board  Need TEE to rule out endocarditis.  Persistent Afib  On coumadin at home with fluctuating INR  INR now 8.72->7.19->3.20 ->5.49  Coumadin on hold  Resume coumadin once INR less than 3.0  INR goal 2-3  Coagulopathy   INR level high 8.72->7.19->3.20 ->5.49  Received again VitK  Close monitoring  Peripheral smear no schistocytes   Thrombocytopenia  Platelet 61->34  Avoid ASA for now  Close monitoring  Bleeding precautions  Hypertension  BP on the low side  IVF Long-term BP goal normotensive  Hyperlipidemia  Home meds:  Zocor 20 mg daily resumed in hospital  LDL 18, goal < 70  Hold off zocor due to low LDL and risk of bleeding  Diabetes  HgbA1c pending, goal < 7.0  Controlled  Tobacco abuse  Current smoker  Smoking cessation counseling provided  Nicotine patch provided  Pt is willing to quit  Other Stroke Risk Factors  Advanced age  ETOH  use, advised to drink no more than 1 - 2 drink(s) a day  Obesity, Body mass index is 37.18 kg/m., recommend weight loss, diet and exercise as appropriate   Other Active Problems  Anemia - 9.4 / 29.8  Leukopenia - 2.9 -> 1.3 -> 4.9  ESRD on HD - 39 / [facility] day # 3  This patient is critically ill due to coagulopathy due to high INR, afib on coumadin, bacteremia, thrombocytopenia and at significant risk of neurological worsening, death form bleeding, heart failure, sepsis, shock. This patient's care requires constant monitoring of vital signs, hemodynamics, respiratory and cardiac monitoring, review of multiple databases, neurological assessment, discussion with family, other specialists and medical decision making of high complexity. I spent 35 minutes of neurocritical care time in the care of this patient.  Rosalin Hawking, MD PhD Stroke Neurology 04/19/2016 2:53 PM    To contact Stroke Continuity provider, please refer to [email or web address] After hours, contact General  Neurology

## 2016-04-19 NOTE — Progress Notes (Addendum)
Patient Name: Johnny Sims Date of Encounter: 04/19/2016  Primary Cardiologist: Dr. Antonieta Loveless Problem List     Active Problems:   Hyperlipidemia   Essential hypertension   GERD (gastroesophageal reflux disease)   Atrial fibrillation status post cardioversion [facility])   Type 2 diabetes mellitus with diabetic polyneuropathy, with long-term current use of insulin (HCC)   ESRD on dialysis ([place])   Weakness   Slurred speech   Leukopenia   Fall   Pressure injury of skin   CVA (cerebral vascular accident) ([place])   Contusion of face   Gram-negative bacteremia     Subjective   Confused. No family at bedside.   Inpatient Medications    Scheduled Meds: . calcitRIOL  0.5 mcg Oral Q T,Th,Sa-HD  . calcium acetate  2,001 mg Oral TID WC  . ceFEPime (MAXIPIME) IV  1 g Intravenous Q24H  . [START ON 04/21/2016] darbepoetin (ARANESP) injection - DIALYSIS  200 mcg Intravenous Q Thu-HD  . digoxin  0.125 mg Oral Daily  . ferric gluconate (FERRLECIT/NULECIT) IV  125 mg Intravenous Q T,Th,Sa-HD  . insulin aspart  0-5 Units Subcutaneous QHS  . insulin aspart  0-9 Units Subcutaneous TID WC  . insulin glargine  15 Units Subcutaneous QHS  . levothyroxine  88 mcg Oral QAC breakfast  . metoprolol tartrate  50 mg Oral BID  . multivitamin  1 tablet Oral QHS  . nicotine  14 mg Transdermal Daily  . pantoprazole  40 mg Oral Daily  . sodium chloride flush  3 mL Intravenous Q12H  . tiotropium  18 mcg Inhalation Daily  . Warfarin - Pharmacist Dosing Inpatient   Does not apply q1800   Continuous Infusions: . amiodarone 30 mg/hr (04/18/16 2340)   PRN Meds: diazepam, ipratropium-albuterol, oxyCODONE   Vital Signs    Vitals:   04/19/16 0530 04/19/16 0600 04/19/16 0630 04/19/16 0804  BP: (!) 133/102 109/88 (!) 89/61   Pulse: (!) 125 (!) 113 (!) 122 (!) 118  Resp: (!) 21 14 13 18   Temp:      TempSrc:      SpO2: 96% 95% 99% 94%  Weight:      Height:        Intake/Output Summary (Last 24  hours) at 04/19/16 1224 Last data filed at 04/19/16 1000  Gross per 24 hour  Intake           472.93 ml  Output                0 ml  Net           472.93 ml   Filed Weights   04/17/16 0416 04/18/16 0500 04/19/16 0345  Weight: 279 lb 5.2 oz (126.7 kg) 288 lb 8 oz (130.9 kg) 289 lb 9.6 oz (131.4 kg)    Physical Exam    GEN: confused and ill  Appearing male in no acute distress.  HEENT: R facial hematoma Neck: Supple, no JVD, carotid bruits, or masses. Cardiac: Ir Ir with tachycardia, no murmurs, rubs, or gallops. No clubbing, cyanosis. 3+ BL Le edema with chronic vascular skin changes. Amputed 2nd left toe.   Radials/DP/PT 2+ and equal bilaterally.  Respiratory:  Respirations regular and unlabored, clear to auscultation bilaterally. GI: Soft, nontender, nondistended, BS + x 4. MS: no deformity or atrophy. Skin: large bruise on r chest Neuro:  Confused Psych: confused  Labs    CBC  Recent Labs  04/18/16 0503 04/19/16 0259 04/19/16 1038  WBC 10.9*  14.6*  --   NEUTROABS 10.1*  --   --   HGB 9.0* 8.5*  --   HCT 28.3* 26.2*  --   MCV 91.0 91.3  --   PLT 34* 28* 20*   Basic Metabolic Panel  Recent Labs  04/18/16 0517 04/19/16 0259  NA 134* 131*  K 4.9 5.0  CL 95* 94*  CO2 25 21*  GLUCOSE 142* 182*  BUN 63* 83*  CREATININE 4.99* 5.38*  CALCIUM 7.7* 7.6*  PHOS 6.6* 6.5*   Liver Function Tests  Recent Labs  04/19/16 0259 04/19/16 1038  AST  --  47*  ALT  --  47  ALKPHOS  --  101  BILITOT  --  1.6*  PROT  --  5.2*  ALBUMIN 1.8* 1.8*   No results for input(s): LIPASE, AMYLASE in the last 72 hours. Cardiac Enzymes No results for input(s): CKTOTAL, CKMB, CKMBINDEX, TROPONINI in the last 72 hours. BNP Invalid input(s): POCBNP D-Dimer  Recent Labs  04/19/16 1038  DDIMER 1.12*   Hemoglobin A1C  Recent Labs  04/17/16 0138  HGBA1C 6.9*   Fasting Lipid Panel  Recent Labs  04/17/16 0138  CHOL 68  HDL 25*  LDLCALC 18  TRIG 123  CHOLHDL 2.7    Thyroid Function Tests No results for input(s): TSH, T4TOTAL, T3FREE, THYROIDAB in the last 72 hours.  Invalid input(s): FREET3  Telemetry    afib at  Rate of 100-110s - Personally Reviewed  ECG    N/A  Radiology    Mr Johnny Sims Headm  Result Date: 04/18/2016 CLINICAL DATA:  Punctate CVA right frontal lobe. Abnormal MRI brain. EXAM: MRA HEAD WITHOUT CONTRAST TECHNIQUE: Angiographic images of the Circle of Willis were obtained using MRA technique without intravenous contrast. COMPARISON:  MRI brain 04/25/2016 FINDINGS: Internal carotid arteries are within normal limits from the high cervical segments through the ICA termini bilaterally. The A1 and M1 segments are normal. The anterior communicating artery is patent. MCA bifurcations are within normal limits bilaterally. The ACA and MCA branch vessels are unremarkable. The right vertebral artery is the dominant vessel. The right PICA origin is visualized and normal. The left PICA is not visualized. Bilateral AICA vessels are present and within normal limits. The basilar artery is normal. Both posterior cerebral arteries originate the basilar tip. The PCA branch vessels are within normal limits. IMPRESSION: Normal variant MRA circle of Willis without significant proximal stenosis, aneurysm, or branch vessel occlusion. Electronically Signed   By: San Morelle M.D.   On: 04/18/2016 07:37    Cardiac Studies   TTE 04/18/16 LV EF: 50% -   55%  ------------------------------------------------------------------- Indications:      CVA 436.  ------------------------------------------------------------------- History:   Risk factors:  Hypertension. Diabetes mellitus.  ------------------------------------------------------------------- Study Conclusions  - Left ventricle: The cavity size was mildly dilated. Wall   thickness was increased in a pattern of mild LVH. Systolic   function was normal. The estimated ejection fraction was in  the   range of 50% to 55%. - Aortic valve: There was mild regurgitation. - Mitral valve: There was moderate to severe regurgitation. - Right atrium: The atrium was mildly dilated. - Pulmonary arteries: PA peak pressure: 45 mm Hg (S). - Pericardium, extracardiac: A small pericardial effusion was   identified.  Patient Profile     Johnny Sims is a 60 year old male with a past medical history of chronic atrial fibrillation (On Warfarin), ESRD on HD (recently started HD earlier this month),  DM, Hepatitis C, HTN, and COPD. He presented to the ED on 04/25/2016 after a fall at home, found to have small right frontal lobe infarct and INR of 8 with thrombocytopenia. Developed rapid afib, cardiology consulted.   Assessment & Plan    1. Afib with RVR - In setting of CVA and bacteremia. Discontinued cardizem due to hypotension. Started on Iv amiodarone and digoxin. On metoprolol 50mg  BID.  Rate still in 110s with hypotension. Limiting therapy. On coumadin for anticoagulation. Received Vit K due to elevated INR of 5.49 today.   2. CVA - PEr neruo  3. Bacteremia - per primary. Pending TEE time. The patient is confused. Will need to discussed with POA to sign consent.   4. ESRD on HD - followed by nephrology  5. Elevated d-dimer -per primary  Signed, Bhagat,Bhavinkumar, PA  04/19/2016, 12:24 PM   The patient has been seen in conjunction with Vin Bhagat, PAC. All aspects of care have been considered and discussed. The patient has been personally interviewed, examined, and all clinical data has been reviewed.   The patient is now on beta blocker therapy, IV amiodarone, and dig was started yesterday.  Must be very careful with digoxin in the presence of amiodarone and kidney failure. Only 2 doses were ordered.  The rate will slow as the IV amiodarone and digoxin load.  We'll check a dig level in a.m.

## 2016-04-19 NOTE — Progress Notes (Signed)
Report given to [facility] RN. Patient to be transferred to [facility] icu.

## 2016-04-19 NOTE — Progress Notes (Signed)
PT Cancellation Note  Patient Details Name: Johnny Sims MRN: [id number] DOB: 05-30-1956   Cancelled Treatment:    Reason Eval/Treat Not Completed: Medical issues which prohibited therapy  Patient awakened and became agitated with HR increasing 108 to 127 bpm (BP also increased 81/47 to 104/57). Patient not following commands, trying to remove lines, and ultimately said "leave me alone!" Will reattempt as medically appropriate.  Breda Bond 04/19/2016, 1:36 PM Pager [phone]

## 2016-04-19 NOTE — Procedures (Signed)
OGT Placement By MD Under Direct Laryngoscopy.  OGT placed, seen entering the esophagus and advanced under direct laryngoscopy and auscultated.  Rush Farmer, M.D. [facility] Pulmonary/Critical Care Medicine. Pager: [phone]. After hours pager: [phone].

## 2016-04-19 NOTE — Progress Notes (Signed)
CRITICAL VALUE ALERT  Critical value received:  INR 5.49  Date of notification:  04/19/16   Time of notification:  3:48 AM   Critical value read back:Yes.    Nurse who received alert: Reap, Jon Gills   MD notified (1st page):  IMTS intern pager  Time of first page:  3:49 AM   MD notified (2nd page):  Time of second page:  Responding MD:    Time MD responded:

## 2016-04-19 NOTE — Progress Notes (Addendum)
Interim Note:   Nursing requested soft restraints for agitation. Ordered.   Nursing also noted of troponin of 0.71 earlier today. Last EKG from 10/22. Will repeat EKG now. Also trending troponins.   Smiley Houseman, MD PGY 2 Family Medicine   Update:   EKG without signs of ischemia. Will trend troponins for now. Likely due to demand.   Smiley Houseman, MD PGY 2 Family Medicine

## 2016-04-19 NOTE — Progress Notes (Signed)
Received a page for INR 5.49.  Patient has been off coumadin and previous INR yesterday was 3.20.  Discussed with pharmacy and will give 2.5mg  IV Vitamin K.

## 2016-04-19 NOTE — Progress Notes (Signed)
Subjective: Patient is lethargic this morning. Easily arousable, A&O x 3 but continues to slur his words.   Objective:  Vital signs in last 24 hours: Vitals:   04/19/16 0530 04/19/16 0600 04/19/16 0630 04/19/16 0804  BP: (!) 133/102 109/88 (!) 89/61   Pulse: (!) 125 (!) 113 (!) 122 (!) 118  Resp: (!) 21 14 13 18   Temp:      TempSrc:      SpO2: 96% 95% 99% 94%  Weight:      Height:       Physical Exam: Constitutional: Altered with slurred speech  HEENT: Large right facial hematoma, Airway was patent and he was breathing comfortable on room air Cardiovascular:Tachycardic and irregular, no murmurs, rubs, or gallops.  Pulmonary/Chest:CTAB, no wheezes, rales, or rhonchi. Abdominal:Soft, non tender, non distended. +BS.  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 Dysarthric.  Assessment/Plan:  Gram Negative Rod Bacteremia: In 2/2 blood cultures. Right IJ TCD pulled 04/17/2016 at bedside per vascular. Patient continues to be altered with slurred speech, likely due to his bacteremia. Patient was hypotensive overnight and received a 500 cc bolus. White count continues to climb. Patient was leukopenic on presentation with wbc of 1.3. Now up to 14.6 today. Patient remains afebrile. Blood cultures from yesterday grew coag negative staph in 1/2 cultures. Likely a contaminant.  -- Continue IV cefepime  -- Repeat blood cultures again tomorrow  -- Hold parameters for pain medication (Hold & Call MD if SBP<90, HR<65, RR<10, O2<90, or altered mental status)  CVA: MRI with small right front lobe acute infarct. No deficits on exam. Patient is dysarthric. Possibly embolic from chronic atrial fibrillation. Patient was in RVR with rates up to 150s on presentation, however, supra therapeutic with INR 8.7.  Other stroke risk factors include DM, HTN, HLD, age, tobacco use, obesity. Etiology is also concerning for endocarditis / bacterial emboli in the setting of GNR baceremia.  -- Neurology following, appreciate recommendations  -- TTE yesterday with normal systolic function, moderate to severe mitral regurgitation, and mild aortic regurgitation new since last ECHO -- Plan for TEE tomorrow  -- Stroke work up per neuro -- Holding ASA given INR -- [place] per pharmacy; continue to hold  -- A1C 6.9 -- Lipid panel with LDL 18, continue simvastatin  -- Hold home hydralazine to allow permissive HTN   Thrombocytopenia: Platelets down trending since admission, down to 28 today. Likely secondary to his GNR bacteremia vs DIC. INR continues to be elevated today despite 2 doses of PO vit K and holding warfarin since admission.  -- DIC labs -- Hepatic function panel   Supratherapeutic INR: On warfarin for stroke prophylaxis in the setting of chronic a-fib. INR of 8.7 on presentation, came down to 3.2 yesterday, elevated again today at 5.49. -- Work up as above -- S/p PO vit K 5 mg on admission and 2.5 mg again overnight  -- Daily INR -- Pharmacy consult, appreciate recs  Atrial fibrillation:Patient was in RVR with rates up to 150 on presentation. He was placed on a diltiazem drip however pressures began to drop overnight. Cardiology was consulted. Patient was started on IV amiodarone and digoxin yesterday. Rates continue to be uncontrolled today on evaluation, 130s this morning.  -- Cards following, appreciate recs -- Tele monitoring -- Continue home metoprolol at half dose due to  hypotension, 50 mg BID -- Continue amiodarone and digoxin per cards   ESRD on HD: T/Th/Sat schedule. Recently initiated dialysis earlier this month with R IJ TCD and LUE fistula placed per vascular. Patient received dialysis on Saturday after admission. His catheter was pulled 04/17/16 due to GNR bacteremia. Currently  euvolemic. Lungs are clear and patient is breathing comfortably on RA.  -- F/u nephrology recs for further HD and temporary line placement  -- Fluid restrict, I/Os, daily weights -- Daily labs -- Continue home calcitriol, Vitamin D, and phoslo  -- Continue home Aranesp injections Q Thursday   COPD: Lungs are clear, patient is breathing comfortably. Negative CXR. -- Continue home Spiriva daily  -- Avoid albuterol for now given RVR  DM II:  -- SSI with meals and QHS -- Lantus 15 QHS  HTN: -- Hold home Hydralazine 50 mg TID in the setting of hypotension and to allow permissive HTN (CVA)  Hypothyroidism: -- TSH low this admission however unsure relevance in acute illness -- Continue home dose synthroid 88 mcg daily   FEN: fluid restrict, hemodialysis, NPO per SLP eval due to AMS VTE ppx: warfarin per pharmacy  Code Status: FULL  Dispo: Anticipated discharge in approximately 2-3 day(s).   Velna Ochs, MD 04/19/2016, 8:45 AM Pager: [phone]

## 2016-04-19 NOTE — Progress Notes (Signed)
PHARMACY - PHYSICIAN COMMUNICATION CRITICAL VALUE ALERT - BLOOD CULTURE IDENTIFICATION (BCID)  Results for orders placed or performed during the hospital encounter of 04/07/2016  Blood Culture ID Panel (Reflexed) (Collected: 04/18/2016  5:06 AM)  Result Value Ref Range   Enterococcus species NOT DETECTED NOT DETECTED   Listeria monocytogenes NOT DETECTED NOT DETECTED   Staphylococcus species DETECTED (A) NOT DETECTED   Staphylococcus aureus NOT DETECTED NOT DETECTED   Methicillin resistance DETECTED (A) NOT DETECTED   Streptococcus species NOT DETECTED NOT DETECTED   Streptococcus agalactiae NOT DETECTED NOT DETECTED   Streptococcus pneumoniae NOT DETECTED NOT DETECTED   Streptococcus pyogenes NOT DETECTED NOT DETECTED   Acinetobacter baumannii NOT DETECTED NOT DETECTED   Enterobacteriaceae species NOT DETECTED NOT DETECTED   Enterobacter cloacae complex NOT DETECTED NOT DETECTED   Escherichia coli NOT DETECTED NOT DETECTED   Klebsiella oxytoca NOT DETECTED NOT DETECTED   Klebsiella pneumoniae NOT DETECTED NOT DETECTED   Proteus species NOT DETECTED NOT DETECTED   Serratia marcescens NOT DETECTED NOT DETECTED   Haemophilus influenzae NOT DETECTED NOT DETECTED   Neisseria meningitidis NOT DETECTED NOT DETECTED   Pseudomonas aeruginosa NOT DETECTED NOT DETECTED   Candida albicans NOT DETECTED NOT DETECTED   Candida glabrata NOT DETECTED NOT DETECTED   Candida krusei NOT DETECTED NOT DETECTED   Candida parapsilosis NOT DETECTED NOT DETECTED   Candida tropicalis NOT DETECTED NOT DETECTED    Name of physician (or Provider) Contacted: Dr. Heber [place]  Changes to prescribed antibiotics required: No changes right now .Could be contaminant - she will let daily rounding team decide if they want to add Vanc.  Sherlon Handing, PharmD, BCPS Clinical pharmacist, pager [phone] 04/19/2016  6:47 AM

## 2016-04-19 NOTE — Progress Notes (Signed)
Subjective: Interval History: has complaints wants to get up.  Objective: Vital signs in last 24 hours: Temp:  [97.3 F (36.3 C)-97.6 F (36.4 C)] 97.6 F (36.4 C) (10/23 2311) Pulse Rate:  [59-125] 118 (10/24 0804) Resp:  [7-27] 18 (10/24 0804) BP: (81-133)/(36-102) 89/61 (10/24 0630) SpO2:  [94 %-100 %] 94 % (10/24 0804) Weight:  [131.4 kg (289 lb 9.6 oz)] 131.4 kg (289 lb 9.6 oz) (10/24 0345) Weight change: 0.499 kg (1 lb 1.6 oz)  Intake/Output from previous day: 10/23 0701 - 10/24 0700 In: 389.4 [I.V.:289.4; IV Piggyback:100] Out: -  Intake/Output this shift: No intake/output data recorded.  General appearance: confused, aggitated, bruises face, chest back Resp: diminished breath sounds bilaterally and rales bibasilar Cardio: irregularly irregular rhythm and systolic murmur: holosystolic 2/6, blowing at apex GI: obese, pos bs, liver down 7 cm Extremities: edema 3+ and stasis changes with brawny edema of LE, AVF LUA Skin: many bruises, stasis changes. pale  Lab Results:  Recent Labs  04/18/16 0503 04/19/16 0259  WBC 10.9* 14.6*  HGB 9.0* 8.5*  HCT 28.3* 26.2*  PLT 34* 28*   BMET:  Recent Labs  04/18/16 0517 04/19/16 0259  NA 134* 131*  K 4.9 5.0  CL 95* 94*  CO2 25 21*  GLUCOSE 142* 182*  BUN 63* 83*  CREATININE 4.99* 5.38*  CALCIUM 7.7* 7.6*   No results for input(s): PTH in the last 72 hours. Iron Studies: No results for input(s): IRON, TIBC, TRANSFERRIN, FERRITIN in the last 72 hours.  Studies/Results: Dg Chest Port 1 View  Result Date: 04/17/2016 CLINICAL DATA:  Shortness of breath, cough, hypertension and diabetes. Dialysis dependent. EXAM: PORTABLE CHEST 1 VIEW COMPARISON:  04/23/2016 FINDINGS: Right IJ dialysis catheter tip in the proximal mid SVC as before. Cardiomegaly evident with central vascular congestion. Increased patchy left mid and lower lung airspace process concerning for developing pneumonia. Right lung remains clear. No effusion or  pneumothorax. Trachea is midline. IMPRESSION: Worsening left mid and lower lung patchy airspace process concerning for developing pneumonia. Cardiomegaly with vascular congestion Electronically Signed   By: Jerilynn Mages.  Shick M.D.   On: 04/17/2016 10:25   Mr [name] Headm  Result Date: 04/18/2016 CLINICAL DATA:  Punctate CVA right frontal lobe. Abnormal MRI brain. EXAM: MRA HEAD WITHOUT CONTRAST TECHNIQUE: Angiographic images of the Circle of Willis were obtained using MRA technique without intravenous contrast. COMPARISON:  MRI brain 04/18/2016 FINDINGS: Internal carotid arteries are within normal limits from the high cervical segments through the ICA termini bilaterally. The A1 and M1 segments are normal. The anterior communicating artery is patent. MCA bifurcations are within normal limits bilaterally. The ACA and MCA branch vessels are unremarkable. The right vertebral artery is the dominant vessel. The right PICA origin is visualized and normal. The left PICA is not visualized. Bilateral AICA vessels are present and within normal limits. The basilar artery is normal. Both posterior cerebral arteries originate the basilar tip. The PCA branch vessels are within normal limits. IMPRESSION: Normal variant MRA circle of Willis without significant proximal stenosis, aneurysm, or branch vessel occlusion. Electronically Signed   By: San Morelle M.D.   On: 04/18/2016 07:37    I have reviewed the patient's current medications.  Assessment/Plan: 1 ESRD low Ptlt and ^^INR prohibit cath insertion yet. Can wait 1 more day, then may need temp cath if coag status not better. 2 low Platelets, Jeannie Done most likely DIC 3 Confusion  Narcotics, CVA  Need to limit narcotics 4 DM per primary  5 Infx diff organisms??? Suspect legs, ??cath.  Vs other 6 CVA 7 Afib. Amio 8 low bps 9 Bipolar P follow coags,, get cath if poss tomorrow, if not temp cath.      LOS: 3 days   Johnny Sims L 04/19/2016,9:05 AM

## 2016-04-20 ENCOUNTER — Encounter (HOSPITAL_COMMUNITY): Payer: Self-pay | Admitting: Certified Registered Nurse Anesthetist

## 2016-04-20 ENCOUNTER — Encounter (HOSPITAL_COMMUNITY): Admission: EM | Disposition: E | Payer: Self-pay | Source: Home / Self Care | Attending: Pulmonary Disease

## 2016-04-20 ENCOUNTER — Inpatient Hospital Stay (HOSPITAL_COMMUNITY): Payer: Medicaid Other

## 2016-04-20 ENCOUNTER — Other Ambulatory Visit (HOSPITAL_COMMUNITY): Payer: Medicaid Other

## 2016-04-20 DIAGNOSIS — N186 End stage renal disease: Secondary | ICD-10-CM

## 2016-04-20 DIAGNOSIS — R6521 Severe sepsis with septic shock: Secondary | ICD-10-CM

## 2016-04-20 DIAGNOSIS — Z978 Presence of other specified devices: Secondary | ICD-10-CM

## 2016-04-20 DIAGNOSIS — R7881 Bacteremia: Secondary | ICD-10-CM

## 2016-04-20 DIAGNOSIS — Z992 Dependence on renal dialysis: Secondary | ICD-10-CM

## 2016-04-20 DIAGNOSIS — A419 Sepsis, unspecified organism: Secondary | ICD-10-CM

## 2016-04-20 LAB — RENAL FUNCTION PANEL
ANION GAP: 15 (ref 5–15)
ANION GAP: 16 — AB (ref 5–15)
Albumin: 1.6 g/dL — ABNORMAL LOW (ref 3.5–5.0)
Albumin: 1.6 g/dL — ABNORMAL LOW (ref 3.5–5.0)
BUN: 102 mg/dL — AB (ref 6–20)
BUN: 104 mg/dL — ABNORMAL HIGH (ref 6–20)
CALCIUM: 8.1 mg/dL — AB (ref 8.9–10.3)
CO2: 24 mmol/L (ref 22–32)
CO2: 23 mmol/L (ref 22–32)
Calcium: 8.1 mg/dL — ABNORMAL LOW (ref 8.9–10.3)
Chloride: 93 mmol/L — ABNORMAL LOW (ref 101–111)
Chloride: 94 mmol/L — ABNORMAL LOW (ref 101–111)
Creatinine, Ser: 5.65 mg/dL — ABNORMAL HIGH (ref 0.61–1.24)
Creatinine, Ser: 5.4 mg/dL — ABNORMAL HIGH (ref 0.61–1.24)
GFR calc Af Amer: 11 mL/min — ABNORMAL LOW (ref >60)
GFR calc non Af Amer: 10 mL/min — ABNORMAL LOW (ref >60)
GFR calc non Af Amer: 10 mL/min — ABNORMAL LOW (ref >60)
GFR, EST AFRICAN AMERICAN: 12 mL/min — AB (ref >60)
GLUCOSE: 213 mg/dL — AB (ref 65–99)
GLUCOSE: 215 mg/dL — AB (ref 65–99)
POTASSIUM: 5.7 mmol/L — AB (ref 3.5–5.1)
Phosphorus: 6.4 mg/dL — ABNORMAL HIGH (ref 2.5–4.6)
Phosphorus: 6.3 mg/dL — ABNORMAL HIGH (ref 2.5–4.6)
Potassium: 5.4 mmol/L — ABNORMAL HIGH (ref 3.5–5.1)
SODIUM: 132 mmol/L — AB (ref 135–145)
Sodium: 133 mmol/L — ABNORMAL LOW (ref 135–145)

## 2016-04-20 LAB — BLOOD GAS, ARTERIAL
Acid-base deficit: 1.8 mmol/L (ref 0.0–2.0)
Bicarbonate: 23.2 mmol/L (ref 20.0–28.0)
DRAWN BY: 365271
FIO2: 40
LHR: 18 {breaths}/min
MECHVT: 660 mL
O2 Saturation: 98.4 %
PATIENT TEMPERATURE: 98
PCO2 ART: 43.8 mmHg (ref 32.0–48.0)
PEEP: 5 cmH2O
PO2 ART: 122 mmHg — AB (ref 83.0–108.0)
pH, Arterial: 7.342 — ABNORMAL LOW (ref 7.350–7.450)

## 2016-04-20 LAB — PREPARE PLATELET PHERESIS
UNIT DIVISION: 0
Unit division: 0

## 2016-04-20 LAB — MAGNESIUM: Magnesium: 2.2 mg/dL (ref 1.7–2.4)

## 2016-04-20 LAB — GLUCOSE, CAPILLARY
GLUCOSE-CAPILLARY: 190 mg/dL — AB (ref 65–99)
GLUCOSE-CAPILLARY: 193 mg/dL — AB (ref 65–99)
Glucose-Capillary: 195 mg/dL — ABNORMAL HIGH (ref 65–99)
Glucose-Capillary: 192 mg/dL — ABNORMAL HIGH (ref 65–99)
Glucose-Capillary: 198 mg/dL — ABNORMAL HIGH (ref 65–99)
Glucose-Capillary: 222 mg/dL — ABNORMAL HIGH (ref 65–99)
Glucose-Capillary: 228 mg/dL — ABNORMAL HIGH (ref 65–99)

## 2016-04-20 LAB — CBC
HEMATOCRIT: 24.9 % — AB (ref 39.0–52.0)
HEMOGLOBIN: 8.4 g/dL — AB (ref 13.0–17.0)
MCH: 29.3 pg (ref 26.0–34.0)
MCHC: 33.7 g/dL (ref 30.0–36.0)
MCV: 86.8 fL (ref 78.0–100.0)
Platelets: 28 10*3/uL — CL (ref 150–400)
RBC: 2.87 MIL/uL — AB (ref 4.22–5.81)
RDW: 18.8 % — ABNORMAL HIGH (ref 11.5–15.5)
WBC: 18.9 10*3/uL — ABNORMAL HIGH (ref 4.0–10.5)

## 2016-04-20 LAB — CATH TIP CULTURE: Culture: NO GROWTH

## 2016-04-20 LAB — PROTIME-INR
INR: 1.7
Prothrombin Time: 20.2 seconds — ABNORMAL HIGH (ref 11.4–15.2)

## 2016-04-20 LAB — DIGOXIN LEVEL: Digoxin Level: <0.2 ng/mL — ABNORMAL LOW (ref 0.8–2.0)

## 2016-04-20 LAB — TROPONIN I: Troponin I: 0.28 ng/mL (ref <0.03)

## 2016-04-20 SURGERY — ECHOCARDIOGRAM, TRANSESOPHAGEAL
Anesthesia: Monitor Anesthesia Care

## 2016-04-20 MED ORDER — VANCOMYCIN HCL 10 G IV SOLR
2000.0000 mg | Freq: Once | INTRAVENOUS | Status: AC
  Administered 2016-04-20: 2000 mg via INTRAVENOUS
  Filled 2016-04-20: qty 2000

## 2016-04-20 MED ORDER — FENTANYL 2500MCG IN NS 250ML (10MCG/ML) PREMIX INFUSION
25.0000 ug/h | INTRAVENOUS | Status: DC
  Administered 2016-04-20: 100 ug/h via INTRAVENOUS
  Administered 2016-04-21 (×2): 300 ug/h via INTRAVENOUS
  Administered 2016-04-22: 350 ug/h via INTRAVENOUS
  Administered 2016-04-22 (×2): 400 ug/h via INTRAVENOUS
  Administered 2016-04-22: 200 ug/h via INTRAVENOUS
  Administered 2016-04-23: 400 ug/h via INTRAVENOUS
  Administered 2016-04-23 (×2): 350 ug/h via INTRAVENOUS
  Administered 2016-04-23 – 2016-04-25 (×9): 400 ug/h via INTRAVENOUS
  Administered 2016-04-26: 200 ug/h via INTRAVENOUS
  Administered 2016-04-26 – 2016-04-27 (×5): 400 ug/h via INTRAVENOUS
  Filled 2016-04-20 (×27): qty 250

## 2016-04-20 MED ORDER — VANCOMYCIN HCL 10 G IV SOLR
1250.0000 mg | INTRAVENOUS | Status: AC
  Administered 2016-04-21 – 2016-04-26 (×6): 1250 mg via INTRAVENOUS
  Filled 2016-04-20 (×6): qty 1250

## 2016-04-20 MED ORDER — HEPARIN SODIUM (PORCINE) 1000 UNIT/ML DIALYSIS
1000.0000 [IU] | INTRAMUSCULAR | Status: DC | PRN
  Filled 2016-04-20: qty 6

## 2016-04-20 MED ORDER — PRO-STAT SUGAR FREE PO LIQD
30.0000 mL | Freq: Three times a day (TID) | ORAL | Status: DC
  Administered 2016-04-20 – 2016-04-26 (×20): 30 mL
  Filled 2016-04-20 (×21): qty 30

## 2016-04-20 MED ORDER — FENTANYL 2500MCG IN NS 250ML (10MCG/ML) PREMIX INFUSION
20.0000 ug/h | INTRAVENOUS | Status: DC
  Administered 2016-04-20: 20 ug/h via INTRAVENOUS
  Filled 2016-04-20: qty 250

## 2016-04-20 MED ORDER — PIPERACILLIN-TAZOBACTAM 3.375 G IVPB 30 MIN
3.3750 g | Freq: Four times a day (QID) | INTRAVENOUS | Status: DC
  Administered 2016-04-20 – 2016-04-26 (×25): 3.375 g via INTRAVENOUS
  Filled 2016-04-20 (×26): qty 50

## 2016-04-20 MED ORDER — FENTANYL BOLUS VIA INFUSION
25.0000 ug | INTRAVENOUS | Status: DC | PRN
  Administered 2016-04-21: 100 ug via INTRAVENOUS
  Administered 2016-04-21 – 2016-04-26 (×2): 50 ug via INTRAVENOUS
  Filled 2016-04-20: qty 100

## 2016-04-20 MED ORDER — INSULIN ASPART 100 UNIT/ML ~~LOC~~ SOLN
0.0000 [IU] | SUBCUTANEOUS | Status: DC
  Administered 2016-04-20 – 2016-04-21 (×2): 5 [IU] via SUBCUTANEOUS
  Administered 2016-04-21: 8 [IU] via SUBCUTANEOUS
  Administered 2016-04-21: 3 [IU] via SUBCUTANEOUS
  Administered 2016-04-21: 8 [IU] via SUBCUTANEOUS
  Administered 2016-04-21 (×2): 5 [IU] via SUBCUTANEOUS
  Administered 2016-04-22: 3 [IU] via SUBCUTANEOUS
  Administered 2016-04-22: 5 [IU] via SUBCUTANEOUS
  Administered 2016-04-22: 8 [IU] via SUBCUTANEOUS
  Administered 2016-04-22: 5 [IU] via SUBCUTANEOUS
  Administered 2016-04-22: 8 [IU] via SUBCUTANEOUS
  Administered 2016-04-22 – 2016-04-23 (×2): 3 [IU] via SUBCUTANEOUS
  Administered 2016-04-23 – 2016-04-24 (×5): 5 [IU] via SUBCUTANEOUS
  Administered 2016-04-24: 8 [IU] via SUBCUTANEOUS
  Administered 2016-04-24: 3 [IU] via SUBCUTANEOUS
  Administered 2016-04-24 – 2016-04-25 (×5): 5 [IU] via SUBCUTANEOUS
  Administered 2016-04-25: 3 [IU] via SUBCUTANEOUS
  Administered 2016-04-25: 8 [IU] via SUBCUTANEOUS
  Administered 2016-04-25: 5 [IU] via SUBCUTANEOUS
  Administered 2016-04-25 – 2016-04-26 (×2): 8 [IU] via SUBCUTANEOUS
  Administered 2016-04-26 (×2): 5 [IU] via SUBCUTANEOUS
  Administered 2016-04-26: 8 [IU] via SUBCUTANEOUS
  Administered 2016-04-26 (×2): 5 [IU] via SUBCUTANEOUS
  Administered 2016-04-27: 8 [IU] via SUBCUTANEOUS
  Administered 2016-04-27 (×2): 3 [IU] via SUBCUTANEOUS

## 2016-04-20 MED ORDER — VITAL HIGH PROTEIN PO LIQD
1000.0000 mL | ORAL | Status: DC
  Administered 2016-04-20: 19:00:00
  Administered 2016-04-20 – 2016-04-22 (×3): 1000 mL
  Administered 2016-04-23: 08:00:00
  Administered 2016-04-23 – 2016-04-26 (×6): 1000 mL
  Filled 2016-04-20 (×2): qty 1000

## 2016-04-20 MED ORDER — SODIUM CHLORIDE 0.9 % IV SOLN
20.0000 ug | Freq: Once | INTRAVENOUS | Status: AC
  Administered 2016-04-20: 20 ug via INTRAVENOUS
  Filled 2016-04-20: qty 5

## 2016-04-20 MED ORDER — PRISMASOL BGK 4/2.5 32-4-2.5 MEQ/L IV SOLN
INTRAVENOUS | Status: DC
  Administered 2016-04-20 – 2016-04-25 (×45): via INTRAVENOUS_CENTRAL
  Filled 2016-04-20 (×56): qty 5000

## 2016-04-20 MED ORDER — PANTOPRAZOLE SODIUM 40 MG PO PACK
40.0000 mg | PACK | Freq: Every day | ORAL | Status: DC
  Administered 2016-04-20 – 2016-04-26 (×7): 40 mg
  Filled 2016-04-20 (×8): qty 20

## 2016-04-20 MED ORDER — PRISMASOL BGK 4/2.5 32-4-2.5 MEQ/L IV SOLN
INTRAVENOUS | Status: DC
  Administered 2016-04-20 – 2016-04-25 (×18): via INTRAVENOUS_CENTRAL
  Filled 2016-04-20 (×24): qty 5000

## 2016-04-20 MED ORDER — PRISMASOL BGK 4/2.5 32-4-2.5 MEQ/L IV SOLN
INTRAVENOUS | Status: DC
  Administered 2016-04-20 – 2016-04-25 (×14): via INTRAVENOUS_CENTRAL
  Filled 2016-04-20 (×20): qty 5000

## 2016-04-20 MED ORDER — SODIUM CHLORIDE 0.9 % IV SOLN
Freq: Once | INTRAVENOUS | Status: AC
  Administered 2016-04-20: 10:00:00 via INTRAVENOUS

## 2016-04-20 MED ORDER — DIGOXIN 0.25 MG/ML IJ SOLN
0.2500 mg | Freq: Once | INTRAMUSCULAR | Status: AC
Start: 2016-04-20 — End: 2016-04-20
  Administered 2016-04-20: 0.25 mg via INTRAVENOUS
  Filled 2016-04-20: qty 2

## 2016-04-20 MED ORDER — SODIUM CHLORIDE 0.9 % FOR CRRT
INTRAVENOUS_CENTRAL | Status: DC | PRN
  Filled 2016-04-20: qty 1000

## 2016-04-20 NOTE — Progress Notes (Signed)
Pharmacy Antibiotic Note  Johnny Sims is a 60 y.o. male admitted on 04/03/2016 with pan sensitive pseudomonas bacteremia. Day 5 of antibiotics total, broadened on 10/24 to vancomycin and Zosyn due to clinical worsening on cefepime monotherapy. Also concern over lower extremity necrotic wounds. Recently started on HD earlier this month and now transitioned to CRRT in ICU. Note, patient never received vancomycin 2g load ordered on 10/24 (last vancomycin dose was 10/21).  Plan: -Change Zosyn to 3.375g q6hr -Give vancomycin 2g IV x1, then start vancomycin 1.25g IV q24h on 10/26 -Monitor clinical picture, culture data, length of therapy, vancomycin trough as indicated   Height: 6\' 2"  (188 cm) Weight: 279 lb 4.8 oz (126.7 kg) IBW/kg (Calculated) : 82.2  Temp (24hrs), Avg:98.2 F (36.8 C), Min:97.5 F (36.4 C), Max:98.7 F (37.1 C)   Recent Labs Lab 04/07/2016 0822 04/14/2016 1001 04/26/2016 1243  04/17/16 0138 04/18/16 0503 04/18/16 0517 04/19/16 0259 04/19/16 1655 04/19/16 1700 04/19/16 1933 04/04/2016 0305  WBC  --   --   --   --  4.9 10.9*  --  14.6* 18.8*  --   --  18.9*  CREATININE  --   --   --   < > 4.00*  --  4.99* 5.38* 5.65* 5.64*  --  5.65*  LATICACIDVEN 2.97* 3.4* 2.4*  --   --   --   --   --  1.5  --  1.3  --   < > = values in this interval not displayed.  Estimated Creatinine Clearance: 19.7 mL/min (by C-G formula based on SCr of 5.65 mg/dL (H)).    Allergies  Allergen Reactions  . Hydrocodone-Acetaminophen Palpitations and Other (See Comments)    Doesn't want to take  . Iodides Other (See Comments)    Patient states he doesn't know what iodine is and doesn't think he is allergic to it despite it being listed with his allergies  . Other Hives and Other (See Comments)    Steroid creams "Corndogs"- Nausea & vomiting   . Sulfacetamide Sodium Hives and Itching  . Tylenol [Acetaminophen] Nausea And Vomiting  . Gabapentin Swelling    SWELLING REACTION UNSPECIFIED   .  Antihistamines, Chlorpheniramine-Type Other (See Comments)    unknown  . Hydralazine Rash  . Iodinated Diagnostic Agents Other (See Comments)    Patient states he doesn't know what iodine is and doesn't think he is allergic to it despite it being listed with his allergies  . Latex Rash  . Lidocaine Itching and Other (See Comments)    Patient is uncertain of this allergy  . Pentazocine Other (See Comments)    unknown  . Pheniramine Other (See Comments)    unknown  . Sulfa Antibiotics Itching and Other (See Comments)  . Tape Rash  . Vicodin [Hydrocodone-Acetaminophen] Palpitations and Other (See Comments)    Doesn't want to take    Antimicrobials this admission:  10/21 Cefepime >>10/24 10/21 Vancomycin >> 10/22; 10/24 >> 10/24 Zosyn >>  Dose adjustments this admission:  10/25: Vancomycin and Zosyn adjusted for CRRT  Microbiology results:  10/21 BCx: Pseudomonas (pan sens) 10/21 MRSA PCR: neg 10/23 BCx: 1/2 CoNS  Thank you for allowing pharmacy to be a part of this patient's care.  Arrie Senate, PharmD PGY-1 Pharmacy Resident Pager: [phone] 04/22/2016

## 2016-04-20 NOTE — Progress Notes (Signed)
ANTICOAGULATION CONSULT NOTE - Follow Up Consult  Pharmacy Consult for Coumadin  Indication: h/o Afib (Supratherapeutic INR on admit); New/acute CVA this admission  Allergies  Allergen Reactions  . Hydrocodone-Acetaminophen Palpitations and Other (See Comments)    Doesn't want to take  . Iodides Other (See Comments)    Patient states he doesn't know what iodine is and doesn't think he is allergic to it despite it being listed with his allergies  . Other Hives and Other (See Comments)    Steroid creams "Corndogs"- Nausea & vomiting   . Sulfacetamide Sodium Hives and Itching  . Tylenol [Acetaminophen] Nausea And Vomiting  . Gabapentin Swelling    SWELLING REACTION UNSPECIFIED   . Antihistamines, Chlorpheniramine-Type Other (See Comments)    unknown  . Hydralazine Rash  . Iodinated Diagnostic Agents Other (See Comments)    Patient states he doesn't know what iodine is and doesn't think he is allergic to it despite it being listed with his allergies  . Latex Rash  . Lidocaine Itching and Other (See Comments)    Patient is uncertain of this allergy  . Pentazocine Other (See Comments)    unknown  . Pheniramine Other (See Comments)    unknown  . Sulfa Antibiotics Itching and Other (See Comments)  . Tape Rash  . Vicodin [Hydrocodone-Acetaminophen] Palpitations and Other (See Comments)    Doesn't want to take    Patient Measurements: Height: 6' 2" (188 cm) Weight: 279 lb 4.8 oz (126.7 kg) IBW/kg (Calculated) : 82.2 Heparin Dosing Weight: 110 kg  Vital Signs: Temp: 98 F (36.7 C) (10/25 1200) Temp Source: Oral (10/25 1200) BP: 96/51 (10/25 1200) Pulse Rate: 114 (10/25 1209)  Labs:  Recent Labs  04/19/16 0259 04/19/16 1038 04/19/16 1655 04/19/16 1700 04/19/16 2018 04/25/2016 0305  HGB 8.5*  --  8.3*  --   --  8.4*  HCT 26.2*  --  25.3*  --   --  24.9*  PLT 28* 20* 25*  --   --  28*  APTT  --  51*  --   --   --   --   LABPROT 51.5* 30.4*  --   --   --  20.2*  INR  5.49* 2.84  --   --   --  1.70  CREATININE 5.38*  --  5.65* 5.64*  --  5.65*  TROPONINI  --   --  0.71*  --  1.54* 0.28*    Estimated Creatinine Clearance: 19.7 mL/min (by C-G formula based on SCr of 5.65 mg/dL (H)).   Medications:  Scheduled:  . sodium chloride   Intravenous Once  . sodium chloride   Intravenous Once  . calcitRIOL  0.5 mcg Oral Q T,Th,Sa-HD  . calcium acetate  2,001 mg Oral TID WC  . chlorhexidine gluconate (MEDLINE KIT)  15 mL Mouth Rinse BID  . [START ON 04/21/2016] darbepoetin (ARANESP) injection - DIALYSIS  200 mcg Intravenous Q Thu-HD  . feeding supplement (PRO-STAT SUGAR FREE 64)  30 mL Per Tube TID  . feeding supplement (VITAL HIGH PROTEIN)  1,000 mL Per Tube Q24H  . ferric gluconate (FERRLECIT/NULECIT) IV  125 mg Intravenous Q T,Th,Sa-HD  . heparin  40 Units/kg Dialysis Once in dialysis  . insulin aspart  0-9 Units Subcutaneous Q4H  . levothyroxine  88 mcg Oral QAC breakfast  . mouth rinse  15 mL Mouth Rinse QID  . multivitamin  1 tablet Oral QHS  . pantoprazole sodium  40 mg Per Tube Daily  .  phytonadione  10 mg Oral Once  . piperacillin-tazobactam (ZOSYN)  IV  3.375 g Intravenous Q12H  . sodium chloride  250 mL Intravenous Once  . sodium chloride flush  3 mL Intravenous Q12H  . tiotropium  18 mcg Inhalation Daily  . vancomycin  2,000 mg Intravenous Once  . [START ON 04/21/2016] vancomycin  1,000 mg Intravenous Q T,Th,Sa-HD  . Warfarin - Pharmacist Dosing Inpatient   Does not apply q1800    Assessment: 60 y.o. male admitted on 03/31/2016 with Acute CVA per MRI brain 87/56, possibly embolic 2/2 afib (Coumadin PTA). INR elevated on admit and continued to trend up despite holding Coumadin, now down to 1.70. Plts continue to declined and now down to 28, hemoglobin stable at 8.4. Per CCM and cards, hold off all anticoagulation for now.  **PTA Coumadin Dose: 45m daily except 7.568mSun/Thurs  Goal of Therapy:  INR 2-3 Monitor platelets by anticoagulation  protocol: Yes   Plan:  -Continue to hold Coumadin for now -Monitor daily INR, plts closely, s/s of bleed -F/U plan for restarting anticoagulation  MiArrie SenatePharmD PGY-1 Pharmacy Resident Pager: [phone]/25/2017

## 2016-04-20 NOTE — Progress Notes (Addendum)
Patient Name: Johnny Sims Date of Encounter: 04/06/2016  Primary Cardiologist: Grayland Jack, M.D.  Hospital Problem List     Active Problems:   Hyperlipidemia   Essential hypertension   GERD (gastroesophageal reflux disease)   Atrial fibrillation status post cardioversion [facility])   Type 2 diabetes mellitus with diabetic polyneuropathy, with long-term current use of insulin (HCC)   ESRD on dialysis ([place])   Weakness   Slurred speech   Leukopenia   Fall   Pressure injury of skin   CVA (cerebral vascular accident) ([place])   Contusion of face   Gram-negative bacteremia   Acute respiratory failure with hypoxia ([place])   Septic shock ([place])     Subjective   The patient is now in the medical intensive care unit, intubated, and being treated for aspiration pneumonia and sepsis.  He has a history of chronic atrial fibrillation. Currently atrial fibrillation rate is poorly controlled related to intercurrent illness.  Inpatient Medications    Scheduled Meds: . sodium chloride   Intravenous Once  . sodium chloride   Intravenous Once  . calcitRIOL  0.5 mcg Oral Q T,Th,Sa-HD  . calcium acetate  2,001 mg Oral TID WC  . chlorhexidine gluconate (MEDLINE KIT)  15 mL Mouth Rinse BID  . [START ON 04/21/2016] darbepoetin (ARANESP) injection - DIALYSIS  200 mcg Intravenous Q Thu-HD  . feeding supplement (PRO-STAT SUGAR FREE 64)  30 mL Per Tube TID  . feeding supplement (VITAL HIGH PROTEIN)  1,000 mL Per Tube Q24H  . ferric gluconate (FERRLECIT/NULECIT) IV  125 mg Intravenous Q T,Th,Sa-HD  . heparin  40 Units/kg Dialysis Once in dialysis  . insulin aspart  0-9 Units Subcutaneous Q4H  . levothyroxine  88 mcg Oral QAC breakfast  . mouth rinse  15 mL Mouth Rinse QID  . multivitamin  1 tablet Oral QHS  . pantoprazole sodium  40 mg Per Tube Daily  . phytonadione  10 mg Oral Once  . piperacillin-tazobactam (ZOSYN)  IV  3.375 g Intravenous Q12H  . sodium chloride  250 mL Intravenous Once  .  sodium chloride flush  3 mL Intravenous Q12H  . tiotropium  18 mcg Inhalation Daily  . vancomycin  2,000 mg Intravenous Once  . [START ON 04/21/2016] vancomycin  1,000 mg Intravenous Q T,Th,Sa-HD  . Warfarin - Pharmacist Dosing Inpatient   Does not apply q1800   Continuous Infusions: . amiodarone 30 mg/hr (04/06/2016 1000)  . fentaNYL infusion INTRAVENOUS 20 mcg/hr (04/09/2016 1051)  . norepinephrine (LEVOPHED) Adult infusion 18.027 mcg/min (04/21/2016 1000)  . dialysis replacement fluid (prismasate)    . dialysis replacement fluid (prismasate)    . dialysate (PRISMASATE)     PRN Meds: sodium chloride, sodium chloride, alteplase, heparin, heparin, ipratropium-albuterol, lidocaine (PF), lidocaine-prilocaine, midazolam, oxyCODONE, pentafluoroprop-tetrafluoroeth, sodium chloride   Vital Signs    Vitals:   04/17/2016 1143 04/25/2016 1145 04/09/2016 1200 04/18/2016 1209  BP: (!) 86/47 (!) 101/59 (!) 96/51   Pulse: (!) 128 (!) 106 (!) 123 (!) 114  Resp: 18 (!) 24 18 (!) 21  Temp: 97.7 F (36.5 C)  98 F (36.7 C)   TempSrc: Oral  Oral   SpO2: 100% 100% 100% 100%  Weight:      Height:        Intake/Output Summary (Last 24 hours) at 04/19/2016 1215 Last data filed at 04/23/2016 1143  Gross per 24 hour  Intake          1279.57 ml  Output  0 ml  Net          1279.57 ml   Filed Weights   04/18/16 0500 04/19/16 0345 03/30/2016 0354  Weight: 288 lb 8 oz (130.9 kg) 289 lb 9.6 oz (131.4 kg) 279 lb 4.8 oz (126.7 kg)    Physical Exam   Chronically ill, diffuse ecchymoses GEN: Obese, on ventilator, sedated. Ecchymoses noted on skin. HEENT: Grossly normal.  Neck: Supple, no JVD, carotid bruits, or masses. Cardiac: IIRR, no murmurs, rubs, or gallops. No clubbing, cyanosis, edema.  Radials/DP/PT 2+ and equal bilaterally.  Respiratory:  Ventilated. Coarse rhonchi. GI: Soft, nontender, nondistended, BS + x 4. MS: no deformity or atrophy. Skin: no rash. Ecchymoses noted throughout skin  exam Neuro:  Sedated Psych: Not able to assess  Labs    CBC  Recent Labs  04/18/16 0503  04/19/16 1655 04/26/2016 0305  WBC 10.9*  < > 18.8* 18.9*  NEUTROABS 10.1*  --   --   --   HGB 9.0*  < > 8.3* 8.4*  HCT 28.3*  < > 25.3* 24.9*  MCV 91.0  < > 88.5 86.8  PLT 34*  < > 25* 28*  < > = values in this interval not displayed. Basic Metabolic Panel  Recent Labs  04/19/16 1655 04/19/16 1700 04/12/2016 0305  NA 133* 133* 132*  K 5.2* 5.2* 5.4*  CL 95* 95* 93*  CO2 '24 24 24  '$ GLUCOSE 175* 178* 213*  BUN 95* 95* 102*  CREATININE 5.65* 5.64* 5.65*  CALCIUM 8.0* 8.1* 8.1*  MG 2.1  --  2.2  PHOS 6.5* 6.6* 6.4*   Liver Function Tests  Recent Labs  04/19/16 1038 04/19/16 1700 04/19/2016 0305  AST 47*  --   --   ALT 47  --   --   ALKPHOS 101  --   --   BILITOT 1.6*  --   --   PROT 5.2*  --   --   ALBUMIN 1.8* 1.7* 1.6*   No results for input(s): LIPASE, AMYLASE in the last 72 hours. Cardiac Enzymes  Recent Labs  04/19/16 1655 04/19/16 2018 04/22/2016 0305  TROPONINI 0.71* 1.54* 0.28*   BNP Invalid input(s): POCBNP D-Dimer  Recent Labs  04/19/16 1038  DDIMER 1.12*   Hemoglobin A1C No results for input(s): HGBA1C in the last 72 hours. Fasting Lipid Panel No results for input(s): CHOL, HDL, LDLCALC, TRIG, CHOLHDL, LDLDIRECT in the last 72 hours. Thyroid Function Tests No results for input(s): TSH, T4TOTAL, T3FREE, THYROIDAB in the last 72 hours.  Invalid input(s): FREET3  Telemetry    Atrial fibrillation with rapid ventricular response. - Personally Reviewed  ECG    No new tracing is available. No acute ischemic changes were noted on the tracing from 04/19/16- Personally Reviewed  Radiology    Dg Chest Port 1 View  Result Date: 03/28/2016 CLINICAL DATA:  Endotracheal intubation. EXAM: PORTABLE CHEST 1 VIEW COMPARISON:  Radiographs of April 19, 2016. FINDINGS: Stable cardiomegaly. Endotracheal and nasogastric tubes are unchanged in position. Left  internal jugular catheter is unchanged with distal tip in expected position of the SVC. No pneumothorax is noted. Right lung is clear. Left lung base is not entirely included in field-of-view, but left basilar opacity is noted concerning for pneumonia or possibly atelectasis. Bony thorax is unremarkable. IMPRESSION: Stable support apparatus. Left lung base not completely included in field-of-view, but left basilar opacity is noted concerning for atelectasis or pneumonia. Electronically Signed   By: Marijo Conception, M.D.  On: 04/16/2016 07:39   Dg Chest Port 1 View  Result Date: 04/19/2016 CLINICAL DATA:  Status post intubation and IJ catheter placement today. EXAM: PORTABLE CHEST 1 VIEW COMPARISON:  Single-view of the chest 04/17/2016. FINDINGS: Right IJ dialysis catheter has been removed. New endotracheal tube is in place with tip in good position at the level of the clavicular heads. Left IJ approach central venous catheter tip projects in the mid to lower superior vena cava. New NG tube is in place with the tip in the fundus of the stomach. There is no pneumothorax. Left basilar airspace opacity is increased since the prior examination. No pleural effusion. There is cardiomegaly. IMPRESSION: Support apparatus projects in good position. Negative for pneumothorax. Increased airspace opacity in left mid and lower lung zones worrisome for pneumonia. Electronically Signed   By: Inge Rise M.D.   On: 04/19/2016 18:04    Cardiac Studies   Echocardiogram of 04/19/16: Study Conclusions  - Left ventricle: The cavity size was mildly dilated. Wall   thickness was increased in a pattern of mild LVH. Systolic   function was normal. The estimated ejection fraction was in the   range of 50% to 55%. - Aortic valve: There was mild regurgitation. - Mitral valve: There was moderate to severe regurgitation. - Right atrium: The atrium was mildly dilated. - Pulmonary arteries: PA peak pressure: 45 mm Hg  (S). - Pericardium, extracardiac: A small pericardial effusion was   identified.  Patient Profile     Mr. Turko is a 60 year old male with a past medical history of chronic atrial fibrillation (On Warfarin), ESRD on HD (recently started HD earlier this month), DM, Hepatitis C, HTN, and COPD. He presented to the ED on 04/13/2016 after a fall at home, found to have small right frontal lobe infarct and INR of 8 with thrombocytopenia. Has chronic afib And noted to have rapid ventricular response on admission. Aspiration pneumonia, and bacteremia he was intubated on 04/19/16. Overall prognosis is poor.  Assessment & Plan    1. Chronic atrial fibrillation: The rapid ventricular response is related to the patient's intercurrent illness. Now on IV amiodarone for rate control. Unable to use diltiazem, beta blockers, due to hypotension requiring levophed. Plan additional doses of IV digoxin to help with rate control. 2. Elevated troponin: This is likely demand related to tachycardia, hypotension, and sepsis. No workup is planned. 3. Coagulopathy: I agree with discontinuation of anticoagulation at this point with platelet counts less than 30,000. 4. Bacteremia and stroke: We will eventually do a TEE when patient is more stable. Currently this is not scheduled.  Overall prognosis is very poor.  Signed, Sinclair Grooms, MD  04/14/2016, 12:15 PM

## 2016-04-20 NOTE — Progress Notes (Signed)
Inpatient Diabetes Program Recommendations  AACE/ADA: New Consensus Statement on Inpatient Glycemic Control (2015)  Target Ranges:  Prepandial:   less than 140 mg/dL      Peak postprandial:   less than 180 mg/dL (1-2 hours)      Critically ill patients:  140 - 180 mg/dL   Lab Results  Component Value Date   GLUCAP 192 (H) 04/14/2016   HGBA1C 6.9 (H) 04/17/2016    Review of Glycemic Control:  Results for TAHER, WILCH (MRN [id number]) as of 04/01/2016 11:48  Ref. Range 04/19/2016 12:55 04/19/2016 20:34 04/19/2016 23:55 04/02/2016 03:50 04/01/2016 07:36  Glucose-Capillary Latest Ref Range: 65 - 99 mg/dL 225 (H) 190 (H) 193 (H) 195 (H) 192 (H)   Diabetes history: Type 2 diabetes Outpatient Diabetes medications: Lantus 30 units q HS Current orders for Inpatient glycemic control:  Novolog sensitive q 4 hours  Inpatient Diabetes Program Recommendations:    Please consider adding Lantus 15 units daily (1/2 of home dose).  Thanks, Adah Perl, RN, BC-ADM Inpatient Diabetes Coordinator Pager [phone] (8a-5p)

## 2016-04-20 NOTE — Progress Notes (Addendum)
PULMONARY / CRITICAL CARE MEDICINE   Name: Johnny Sims MRN: [id number] DOB: [birth year]    ADMISSION DATE:  04/08/2016 CONSULTATION DATE:  04/19/2016  REFERRING MD:  Dr. Thayer Headings  CHIEF COMPLAINT:  AMS now hypotension  HISTORY OF PRESENT ILLNESS:   60 year old male with PMH as below, which is significant for ESRD on HD TTS, DM, COPD, PAF on warfarin, and dCHF. He was recently 10/2 admitted for hypervolemia in the setting of progression of his CKD at which point HD was initiated through newly placed tunneled catheter. Once euvolemic (4 HD treatments) he was discharged 10/7. 10/19 he presented to ED post fall and was found to have INR elevation, but with the exception with some bruising he was otherwise stable and was discharged. 10/21 he again presented to [facility] ED, this time with altered mental status and slurred speech.  There was concern for CVA as he presented with AF-RVR. But INR found to be elevated at 8.  MRI demonstrated tiny R frontal lobe infarct. Not clear whether this is entirely the cause of his altered mental status. There was also concern for sepsis in setting of leukopenia and CXR infiltrate so he was started on cefepime and vancomycin. Initial blood cultures started to grow GNR and tunneled HD cath was pulled 10/22. Turned out to be pseudomonas putida pan-sensitive. Repeat cultures on 10/23 with MRSA. ABX remain Vancomycin and Zosyn. TTE did not describe vegetation. 10/24 he became hypotensive and airway protection worsened. PCCM asked to see.   PAST MEDICAL HISTORY :  He  has a past medical history of Anxiety; Asthma; Bipolar 1 disorder ([place]); Chronic pain; CKD (chronic kidney disease), stage II; COPD (chronic obstructive pulmonary disease) ([place]); Diabetes mellitus; Fibromyalgia; GERD (gastroesophageal reflux disease); Hepatitis C; Hepatitis C; Hyperlipidemia; Hypertension; Hypothyroidism; Melanoma ([place]); OA (osteoarthritis); PAF (paroxysmal atrial fibrillation) ([place]);  Pancreatitis, acute (2015); RA (rheumatoid arthritis) ([place]); and Venous stasis.  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  Allergies  Allergen Reactions  . Hydrocodone-Acetaminophen Palpitations and Other (See Comments)    Doesn't want to take  . Iodides Other (See Comments)    Patient states he doesn't know what iodine is and doesn't think he is allergic to it despite it being listed with his allergies  . Other Hives and Other (See Comments)    Steroid creams "Corndogs"- Nausea & vomiting   . Sulfacetamide Sodium Hives and Itching  . Tylenol [Acetaminophen] Nausea And Vomiting  . Gabapentin Swelling    SWELLING REACTION UNSPECIFIED   . Antihistamines, Chlorpheniramine-Type Other (See Comments)    unknown  . Hydralazine Rash  . Iodinated Diagnostic Agents Other (See Comments)    Patient states he doesn't know what iodine is and doesn't think he is allergic to it despite it being listed with his allergies  . Latex Rash  . Lidocaine Itching and Other (See Comments)    Patient is uncertain of this allergy  . Pentazocine Other (See Comments)    unknown  . Pheniramine Other (See Comments)    unknown  . Sulfa Antibiotics Itching and Other (See Comments)  . Tape Rash  . Vicodin [Hydrocodone-Acetaminophen] Palpitations and Other (See Comments)  Doesn't want to take    No current facility-administered medications on file prior to encounter.    Current Outpatient Prescriptions on File Prior to Encounter   Medication Sig  . albuterol (PROVENTIL) (2.5 MG/3ML) 0.083% nebulizer solution Take 2.5 mg by nebulization 4 (four) times daily.  . calcium acetate (PHOSLO) 667 MG capsule Take 2 capsules (1,334 mg total) by mouth 3 (three) times daily with meals.  . cholecalciferol (VITAMIN D) 1000 units tablet Take 1,000 Units by mouth daily.  Marland Kitchen COUMADIN 10 MG tablet Take as directed by the Coumadin clinic. (Patient taking differently: Take 10 mg by mouth See admin instructions. Mon/Tues/Wed/Fri/Sat (in the evening))  . COUMADIN 7.5 MG tablet Take as directed by Coumadin clinic. (Patient taking differently: Take 7.5 mg by mouth. Sun/Thurs (in the evening))  . Darbepoetin Alfa (ARANESP) 100 MCG/0.5ML SOSY injection Inject 0.5 mLs (100 mcg total) into the vein every Tuesday with hemodialysis.  Marland Kitchen diazepam (VALIUM) 10 MG tablet Take 10 mg by mouth 4 (four) times daily.   . hydrALAZINE (APRESOLINE) 50 MG tablet Take 1 tablet (50 mg total) by mouth 3 (three) times daily.  . Insulin Glargine (LANTUS SOLOSTAR) 100 UNIT/ML Solostar Pen Inject 30 Units into the skin daily at 10 pm.  . levothyroxine (SYNTHROID) 88 MCG tablet Take 1 tablet (88 mcg total) by mouth daily before breakfast. Will send additional refills based off blood level (Patient taking differently: Take 88 mcg by mouth daily before breakfast. )  . metoprolol (LOPRESSOR) 100 MG tablet Take 1 tablet (100 mg total) by mouth 2 (two) times daily. (Patient taking differently: Take 100-200 mg by mouth 2 (two) times daily. Take 2 tablets in the morning, and 1 tablet in the evening)  . multivitamin (RENA-VIT) TABS tablet Take 1 tablet by mouth at bedtime.  . nicotine (NICODERM CQ - DOSED IN MG/24 HOURS) 14 mg/24hr patch Place 14 mg onto the skin daily.  Marland Kitchen omeprazole (PRILOSEC) 20 MG capsule Take 1 capsule (20 mg total) by mouth daily. Take with Harvoni at the same time under FASTING condition (Patient taking differently: Take 20 mg by mouth daily. )  . oxyCODONE  (ROXICODONE) 15 MG immediate release tablet Take 1 tablet (15 mg total) by mouth every 3 (three) hours as needed for pain.  . simvastatin (ZOCOR) 20 MG tablet Take 1 tablet (20 mg total) by mouth daily at 6 PM.  . SPIRIVA HANDIHALER 18 MCG inhalation capsule INHALE CONTENTS OF ONE CAPSULE ONCE A DAY (Patient taking differently: INHALE CONTENTS OF ONE CAPSULE twice A DAY)  . psyllium (HYDROCIL/METAMUCIL) 95 % PACK Take 1 packet by mouth daily. (Patient not taking: Reported on 03/26/2016)  . [DISCONTINUED] Insulin Glulisine (APIDRA) 100 UNIT/ML Solostar Pen Inject 28 Units into the skin at bedtime.    FAMILY HISTORY:  His indicated that his mother is alive. He indicated that his father is deceased. He indicated that his brother is alive.    SOCIAL HISTORY: He  reports that he has been smoking Cigarettes.  He has a 43.00 pack-year smoking history. He has never used smokeless tobacco. He reports that he drinks alcohol. He reports that he does not use drugs.  REVIEW OF SYSTEMS:   unable  SUBJECTIVE:  Remains on levo 18. On amio drip for a fib. Failed CPAP trial this a.m. Combative, requiring fentanyl per nurse, who requests continuous orders for bolusing.   VITAL SIGNS: BP (!) 93/52   Pulse (!) 112   Temp 98.7 F (37.1 C) (Oral)  Resp 19   Ht 6\' 2"  (1.88 m)   Wt 279 lb 4.8 oz (126.7 kg)   SpO2 100%   BMI 35.86 kg/m   HEMODYNAMICS:    VENTILATOR SETTINGS: Vent Mode: PRVC FiO2 (%):  [40 %-100 %] 40 % Set Rate:  [14 bmp-18 bmp] 18 bmp Vt Set:  [660 mL] 660 mL PEEP:  [5 cmH20] 5 cmH20 Plateau Pressure:  [14 cmH20-18 cmH20] 14 cmH20  INTAKE / OUTPUT: I/O last 3 completed shifts: In: 1370.1 [I.V.:1270.1; IV Piggyback:100] Out: -   PHYSICAL EXAMINATION: General:  Chronically ill appearing male, ETT in place Neuro:  Opens eyes, following some commands (wiggles toes) HEENT:  [place]/AT, PERRL Cardiovascular: Irregularly irregular, 3/6 systolic murmur. 3+ LE to sacrum Lungs:  Coarse  breath sounds and referred upper airway sounds Abdomen:  Soft non-tender Musculoskeletal: No acute deformity Skin:  Grossly intact. PVD changes to LE. Oozing central line of left neck.   LABS:  BMET  Recent Labs Lab 04/19/16 1655 04/19/16 1700 04/25/2016 0305  NA 133* 133* 132*  K 5.2* 5.2* 5.4*  CL 95* 95* 93*  CO2 24 24 24   BUN 95* 95* 102*  CREATININE 5.65* 5.64* 5.65*  GLUCOSE 175* 178* 213*    Electrolytes  Recent Labs Lab 04/19/16 1655 04/19/16 1700 04/15/2016 0305  CALCIUM 8.0* 8.1* 8.1*  MG 2.1  --  2.2  PHOS 6.5* 6.6* 6.4*    CBC  Recent Labs Lab 04/19/16 0259 04/19/16 1038 04/19/16 1655 04/26/2016 0305  WBC 14.6*  --  18.8* 18.9*  HGB 8.5*  --  8.3* 8.4*  HCT 26.2*  --  25.3* 24.9*  PLT 28* 20* 25* 28*    Coag's  Recent Labs Lab 04/19/16 0259 04/19/16 1038 04/19/2016 0305  APTT  --  51*  --   INR 5.49* 2.84 1.70    Sepsis Markers  Recent Labs Lab 04/09/2016 1243 04/19/16 1655 04/19/16 1933  LATICACIDVEN 2.4* 1.5 1.3    ABG  Recent Labs Lab 04/19/16 1651 04/02/2016 0347  PHART 7.253* 7.342*  PCO2ART 55.5* 43.8  PO2ART 113.0* 122*    Liver Enzymes  Recent Labs Lab 04/12/2016 0433  04/19/16 1038 04/19/16 1700 03/31/2016 0305  AST 42*  --  47*  --   --   ALT 29  --  47  --   --   ALKPHOS 63  --  101  --   --   BILITOT 1.1  --  1.6*  --   --   ALBUMIN 2.8*  < > 1.8* 1.7* 1.6*  < > = values in this interval not displayed.  Cardiac Enzymes  Recent Labs Lab 04/19/16 1655 04/19/16 2018 04/09/2016 0305  TROPONINI 0.71* 1.54* 0.28*    Glucose  Recent Labs Lab 04/19/16 0817 04/19/16 1255 04/19/16 2034 04/19/16 2355 04/15/2016 0350 04/16/2016 0736  GLUCAP 173* 225* 190* 193* 195* 192*    Imaging Dg Chest Port 1 View  Result Date: 03/28/2016 CLINICAL DATA:  Endotracheal intubation. EXAM: PORTABLE CHEST 1 VIEW COMPARISON:  Radiographs of April 19, 2016. FINDINGS: Stable cardiomegaly. Endotracheal and nasogastric tubes  are unchanged in position. Left internal jugular catheter is unchanged with distal tip in expected position of the SVC. No pneumothorax is noted. Right lung is clear. Left lung base is not entirely included in field-of-view, but left basilar opacity is noted concerning for pneumonia or possibly atelectasis. Bony thorax is unremarkable. IMPRESSION: Stable support apparatus. Left lung base not completely included in field-of-view, but left basilar opacity  is noted concerning for atelectasis or pneumonia. Electronically Signed   By: Marijo Conception, M.D.   On: 04/02/2016 07:39   Dg Chest Port 1 View  Result Date: 04/19/2016 CLINICAL DATA:  Status post intubation and IJ catheter placement today. EXAM: PORTABLE CHEST 1 VIEW COMPARISON:  Single-view of the chest 04/17/2016. FINDINGS: Right IJ dialysis catheter has been removed. New endotracheal tube is in place with tip in good position at the level of the clavicular heads. Left IJ approach central venous catheter tip projects in the mid to lower superior vena cava. New NG tube is in place with the tip in the fundus of the stomach. There is no pneumothorax. Left basilar airspace opacity is increased since the prior examination. No pleural effusion. There is cardiomegaly. IMPRESSION: Support apparatus projects in good position. Negative for pneumothorax. Increased airspace opacity in left mid and lower lung zones worrisome for pneumonia. Electronically Signed   By: Inge Rise M.D.   On: 04/19/2016 18:04     STUDIES:  Ct head 10/21 > RIGHT facial contusion. No acute intracranial process. Mild chronic small vessel ischemic Disease. 19 mm LEFT thyroid nodule for which follow up thyroid sonogram is recommended on a nonemergent basis. ECHO 10/23 > LVEF 50-55%; mild LVH, R atrium mildly dilated, mild aortic regurg, mod-severe mitral regurg, PA pressure 45  CULTURES: BCx2 10/21 > Pseudomonas putida BCx2 10/23 > MRSA >>> Catheter tip 10/22 > NGTD  >>>  ANTIBIOTICS: Zosyn 10/21 >>> Vancomycin 10/21 >>>  SIGNIFICANT EVENTS:   LINES/TUBES: ETT 10/24 > L IJ CVC 10/24 >  PIVs x2  DISCUSSION: 60 year old male with newly converted ESRD. Admitted 10/21 with AMS and found to have small CVA and now bacteremia likely from tunneled HD cath. On broad spectrum antibiotics, but clinical status deteriorated. 10/24 transferred to ICU for intubation, pressors. Will need trialysis cath.   ASSESSMENT / PLAN:  PULMONARY A: Acute hypoxemic respiratory failure secondary to aspiration PNA COPD without acute exacerbation Improved respiratory acidosis P:   Full ventilator support Daily CXRs while intubated ABG VAP bundle  CARDIOVASCULAR A:  Shock - likely septic vs cardiogenic with severe MR PAF on warfarin H/o hypotension, HLD  P:  Telemetry monitoring MAP goal > 4mmHg KVO IVF as he appears volume overloaded Levophed infusion for MAP goal TEE pending Holding home antihypertensives  RENAL A:   ESRD on HD TTS  P:   Currently no HD access, appears hypervolemic Nephrology following  Place trialysis cath with FFP?  GASTROINTESTINAL A:   Hepatitis C GERD  P:   NPO LFTs  Order nutrition consult for TFs Pepcid daily for SUP  HEMATOLOGIC A:   Thrombocytopenia, severe --> stable Warfarin coagulopathy Anemia  P:  Holding coumadin and anticoagulation altogether. Trend CBC and coags Transfuse for Hgb < 7  INFECTIOUS A:   Bacteremia MRSA and Pseudomonas Putida Likely aspiration PNA Need to rule out infective endocarditis  P:   Continue Zosyn and Vancomycin  Continue to follow cultures  ENDOCRINE A:   DM Hypothyroid with low TSH CBGs in 200s  P:   CBG monitoring and SSI Consider insulin gtt  NEUROLOGIC A:   Acute metabolic encephalopathy CVA Bipolar disorder P:   RASS goal: 0 to -1 Intermittent fentanyl/versed Stroke team following   FAMILY  - Updates: Brother updated  - Inter-disciplinary  family meet or Palliative Care meeting due by: 10/31  Olene Floss, MD [place], PGY-2  04/19/2016 8:55 AM  Rush Farmer,  M.D. [facility] Pulmonary/Critical Care Medicine. Pager: [phone]. After hours pager: [phone].

## 2016-04-20 NOTE — Progress Notes (Signed)
SLP Cancellation Note  Patient Details Name: Johnny Sims MRN: [id number] DOB: 1955/08/03   Cancelled treatment:       Reason Eval/Treat Not Completed: Medical issues which prohibited therapy. Pt transferred to ICU and intubated yesterday. Will f/u as able.   Germain Osgood 04/24/2016, 9:11 AM  Germain Osgood, M.A. CCC-SLP [phone]

## 2016-04-20 NOTE — Progress Notes (Signed)
Initial Nutrition Assessment  DOCUMENTATION CODES:   Obesity unspecified  INTERVENTION:    Initiate TF via OGT with Vital High Protein at goal rate of 65 ml/h (1560 ml per day) and Prostat 30 ml TID to provide 1860 kcals, 182 gm protein, 1304 ml free water daily.  NUTRITION DIAGNOSIS:   Inadequate oral intake related to inability to eat as evidenced by NPO status.  GOAL:   Provide needs based on ASPEN/SCCM guidelines  MONITOR:   Vent status, Labs, TF tolerance, Skin, I & O's  REASON FOR ASSESSMENT:   Consult Enteral/tube feeding initiation and management  ASSESSMENT:   60 year old male with newly converted ESRD. Admitted 10/21 with AMS and found to have small CVA and now bacteremia likely from tunneled HD cath. On broad spectrum antibiotics, but clinical status deteriorated. 10/24 transferred to ICU for intubation, pressors.   Discussed patient with RN today. Plans to start CRRT today. Received MD Consult for TF initiation and management.  Unable to complete Nutrition-Focused physical exam at this time.   Patient is currently intubated on ventilator support Temp (24hrs), Avg:98.1 F (36.7 C), Min:97.1 F (36.2 C), Max:98.7 F (37.1 C)  Propofol: none  Labs reviewed: sodium low; potassium & phosphorus elevated. Medications reviewed and include phoslo, renavite, vitamin K.  Diet Order:  Diet NPO time specified  Skin:  Wound (see comment) (DTI to sacrum & heel; stage II to buttocks)  Last BM:  10/22  Height:   Ht Readings from Last 1 Encounters:  04/14/2016 6\' 2"  (1.88 m)    Weight:   Wt Readings from Last 1 Encounters:  03/30/2016 279 lb 4.8 oz (126.7 kg)    Ideal Body Weight:  86.4 kg  BMI:  Body mass index is 35.86 kg/m.  Estimated Nutritional Needs:   Kcal:  [id number]  Protein:  172 gm  Fluid:  2 L  EDUCATION NEEDS:   No education needs identified at this time  Molli Barrows, [place], [place], [place] Pager [phone] After Hours Pager  [phone]

## 2016-04-20 NOTE — Progress Notes (Signed)
Subjective: Interval History: Tx to ICU, entub, on pressors Objective: Vital signs in last 24 hours: Temp:  [97.1 F (36.2 C)-98.6 F (37 C)] 98 F (36.7 C) (10/25 0346) Pulse Rate:  [50-139] 102 (10/25 0645) Resp:  [9-33] 18 (10/25 0645) BP: (67-181)/(46-152) 91/54 (10/25 0645) SpO2:  [94 %-100 %] 100 % (10/25 0645) FiO2 (%):  [40 %-100 %] 40 % (10/25 0345) Weight:  [126.7 kg (279 lb 4.8 oz)] 126.7 kg (279 lb 4.8 oz) (10/25 0354) Weight change: -4.672 kg (-10 lb 4.8 oz)  Intake/Output from previous day: 10/24 0701 - 10/25 0700 In: 1075.7 [I.V.:1025.7; IV Piggyback:50] Out: -  Intake/Output this shift: No intake/output data recorded.  General appearance: moderately obese, pale and edematous, moves extrem, not coop or alert Neck: LIJ cath Resp: rhonchi bilaterally Cardio: irregularly irregular rhythm and systolic murmur: holosystolic 2/6, blowing at apex GI: obese, pos bs but decreased, liver down 6 cm Extremitie3-4+, brawny edema of LE to presacral, erythema extending up legs to groinedema 3-4+, brawny edema of LE to presacral, erythema extending up legs to groin  Lab Results:  Recent Labs  04/19/16 1655 04/15/2016 0305  WBC 18.8* 18.9*  HGB 8.3* 8.4*  HCT 25.3* 24.9*  PLT 25* 28*   BMET:  Recent Labs  04/19/16 1700 04/06/2016 0305  NA 133* 132*  K 5.2* 5.4*  CL 95* 93*  CO2 24 24  GLUCOSE 178* 213*  BUN 95* 102*  CREATININE 5.64* 5.65*  CALCIUM 8.1* 8.1*   No results for input(s): PTH in the last 72 hours. Iron Studies: No results for input(s): IRON, TIBC, TRANSFERRIN, FERRITIN in the last 72 hours.  Studies/Results: Dg Chest Port 1 View  Result Date: 04/19/2016 CLINICAL DATA:  Status post intubation and IJ catheter placement today. EXAM: PORTABLE CHEST 1 VIEW COMPARISON:  Single-view of the chest 04/17/2016. FINDINGS: Right IJ dialysis catheter has been removed. New endotracheal tube is in place with tip in good position at the level of the clavicular  heads. Left IJ approach central venous catheter tip projects in the mid to lower superior vena cava. New NG tube is in place with the tip in the fundus of the stomach. There is no pneumothorax. Left basilar airspace opacity is increased since the prior examination. No pleural effusion. There is cardiomegaly. IMPRESSION: Support apparatus projects in good position. Negative for pneumothorax. Increased airspace opacity in left mid and lower lung zones worrisome for pneumonia. Electronically Signed   By: Inge Rise M.D.   On: 04/19/2016 18:04    I have reviewed the patient's current medications.  Assessment/Plan: 1 ESRD vol xs, ^ K .  ^ solute.  Needs RRT , will start CRRT, lower solute, K and vol.  Needs neck line changed out 2 Resp failure per CCM 3 Sepsis Staph and Gram neg on AB, cath out. ?? Legs vs cath 4 DM controlled 5 Afib rate controlled 6 Low ptlt , coagulopathy. Not feasible for PC 7 Bipolar 8 Anemia 10 HPTH P CRRT, esa, AB, vent.     LOS: 4 days   Mariyam Remington L 04/19/2016,7:03 AM

## 2016-04-20 NOTE — Progress Notes (Signed)
eLink Physician-Brief Progress Note Patient Name: Johnny Sims DOB: [birth year] MRN: [id number]   Date of Service  04/12/2016  HPI/Events of Note  Multiple issues: 1. Blood glucose = 220 and 2. Agitiation.   eICU Interventions  Will order: 1. Increase Q 4 hour Novolog SSI to moderate. 2. Titrate Fentanyl IV infusion as already ordered.  3. Fentanyl Bolus via infusion 25-100 mcg Q 1 hour PRN agitation or pain.      Intervention Category Intermediate Interventions: Hyperglycemia - evaluation and treatment Minor Interventions: Agitation / anxiety - evaluation and management  Sommer,Steven Eugene 03/30/2016, 11:50 PM

## 2016-04-20 NOTE — Progress Notes (Signed)
STROKE TEAM PROGRESS NOTE   SUBJECTIVE (INTERVAL HISTORY) RN is at the bedside.  Patient intubated yesterday for respiratory distress and hypotension, concerning for aspiration and sepsis. On fentanyl PRN, still intermittently agitated on vent. Still uncontrolled afib RVR with low BP, cardiology on board. INR today 1.70, and plan for CRRT today. pending TEE to rule out endocarditis, but pt not stable for the test at this time. Continue on vanco and zosyn.     OBJECTIVE Temp:  [97.1 F (36.2 C)-98.7 F (37.1 C)] 98.1 F (36.7 C) (10/25 1230) Pulse Rate:  [50-139] 115 (10/25 1230) Cardiac Rhythm: Atrial fibrillation (10/25 0800) Resp:  [9-33] 20 (10/25 1230) BP: (67-181)/(43-152) 106/57 (10/25 1230) SpO2:  [99 %-100 %] 100 % (10/25 1230) FiO2 (%):  [40 %-100 %] 40 % (10/25 1143) Weight:  [279 lb 4.8 oz (126.7 kg)] 279 lb 4.8 oz (126.7 kg) (10/25 0354)  CBC:  Recent Labs Lab 04/19/2016 0433  04/18/16 0503  04/19/16 1655 03/27/2016 0305  WBC 2.9*  < > 10.9*  < > 18.8* 18.9*  NEUTROABS 2.3  --  10.1*  --   --   --   HGB 10.8*  < > 9.0*  < > 8.3* 8.4*  HCT 33.8*  < > 28.3*  < > 25.3* 24.9*  MCV 91.4  < > 91.0  < > 88.5 86.8  PLT 84*  < > 34*  < > 25* 28*  < > = values in this interval not displayed.  Basic Metabolic Panel:   Recent Labs Lab 04/19/16 1655 04/19/16 1700 03/28/2016 0305  NA 133* 133* 132*  K 5.2* 5.2* 5.4*  CL 95* 95* 93*  CO2 24 24 24   GLUCOSE 175* 178* 213*  BUN 95* 95* 102*  CREATININE 5.65* 5.64* 5.65*  CALCIUM 8.0* 8.1* 8.1*  MG 2.1  --  2.2  PHOS 6.5* 6.6* 6.4*    Lipid Panel:     Component Value Date/Time   CHOL 68 04/17/2016 0138   TRIG 123 04/17/2016 0138   HDL 25 (L) 04/17/2016 0138   CHOLHDL 2.7 04/17/2016 0138   VLDL 25 04/17/2016 0138   LDLCALC 18 04/17/2016 0138   HgbA1c:  Lab Results  Component Value Date   HGBA1C 6.9 (H) 04/17/2016   Urine Drug Screen:     Component Value Date/Time   LABOPIA POSITIVE (A) 09/24/2014 0144   COCAINSCRNUR NONE DETECTED 09/24/2014 0144   COCAINSCRNUR POS (A) 09/20/2007 2131   LABBENZ NONE DETECTED 09/24/2014 0144   LABBENZ NEG 09/20/2007 2131   AMPHETMU NONE DETECTED 09/24/2014 0144   THCU NONE DETECTED 09/24/2014 0144   LABBARB NONE DETECTED 09/24/2014 0144      IMAGING I have personally reviewed the radiological images below and agree with the radiology interpretations.  Dg Chest 2 View 04/25/2016 Cardiomegaly with mild congestive changes similar to prior radiograph. Superimposed pneumonia is not excluded.   Ct Head Wo Contrast 04/12/2016 CT HEAD:  RIGHT facial contusion. No acute intracranial process. Mild chronic small vessel ischemic disease.   Mr Brain Wo Contrast 04/10/2016 Tiny right frontal lobe acute infarct. No intracranial hemorrhage. Mild chronic microvascular changes.  Moderate global atrophy without hydrocephalus. Diffusion-weighted abnormality upper cervical spine/ skullbase of indeterminate etiology.  Dg Chest Port 1 View 04/17/2016 IMPRESSION: Worsening left mid and lower lung patchy airspace process concerning for developing pneumonia. Cardiomegaly with vascular congestion    Mr Virgel Paling 04/18/2016 IMPRESSION: Normal variant MRA circle of Willis without significant proximal stenosis, aneurysm, or branch vessel  occlusion.   CUS - Bilateral: No significant (1-39%) ICA stenosis. Antegrade vertebral flow.   TTE  - Left ventricle: The cavity size was mildly dilated. Wall   thickness was increased in a pattern of mild LVH. Systolic   function was normal. The estimated ejection fraction was in the   range of 50% to 55%. - Aortic valve: There was mild regurgitation. - Mitral valve: There was moderate to severe regurgitation. - Right atrium: The atrium was mildly dilated. - Pulmonary arteries: PA peak pressure: 45 mm Hg (S). - Pericardium, extracardiac: A small pericardial effusion was   identified.  TEE pending   PHYSICAL EXAM  Temp:  [97.1  F (36.2 C)-98.7 F (37.1 C)] 98.1 F (36.7 C) (10/25 1230) Pulse Rate:  [50-139] 115 (10/25 1230) Resp:  [9-33] 20 (10/25 1230) BP: (67-181)/(43-152) 106/57 (10/25 1230) SpO2:  [99 %-100 %] 100 % (10/25 1230) FiO2 (%):  [40 %-100 %] 40 % (10/25 1143) Weight:  [279 lb 4.8 oz (126.7 kg)] 279 lb 4.8 oz (126.7 kg) (10/25 0354)  General - Well nourished, well developed, intubated  Ophthalmologic - Fundi not visualized due to noncooperation.  Cardiovascular - irregularly irregular heart rate and rhythm and tachycardia  Skin - ecchymosis at right face and right chest wall  Neuro - intubated on vent, with sedation as needed. Barely open eyes on voice, but then get agitated and cough gag. On wrist soft restraint. Not following simple commands. Pupils 39mm bilaterally and sluggish on light, not blinking to visual threat bilaterally, tongue in middle in mouth. Bilateral UE purposeful movement on pain, bilateral LE swollen and minimal withdraw with pain stimulation. DTR 1+ and no babinski. Sensation, coordination and gait not tested.    ASSESSMENT/PLAN [name] is a 60 y.o. male with history of end-stage renal disease on hemodialysis, tobacco use, hypertension, diabetes mellitus, atrial fibrillation on Coumadin, hypertension and hyperlipidemia  presenting with recurrent falls at home and markedly elevated INR (8.72), and slurred speech.  He did not receive IV t-PA due to anticoagulation.  Stroke:  Tiny right frontal lobe infarct, need to rule out endocarditis. Infarct less likely due to afib due to high INR levels.  Resultant  Intubated on vent  MRI - tiny right frontal lobe acute infarct.   MRA - unremarkable  Carotid Doppler unremarkable  2D Echo  EF 50-55%  Pending TEE to rule out endocarditis - currently pt not stable for test  LDL - 18  HgbA1c 6.9  VTE prophylaxis - INR 7.19 - > 3.20 -> 5.49 -> 1.70 Diet NPO time specified  warfarin daily prior to admission, now  warfarin on hold due to high INR and severe thrombocytopenia and traumatic intubation  Patient counseled to be compliant with his antithrombotic medications  Ongoing aggressive stroke risk factor management  Therapy recommendations:  pending  Disposition:  Pending  Bacteremia  2/2 BCx showed G- rods  Repeat Cx 1/2 G+ cocci in cluster  On vanco and zosyn now  ID on board  Need TEE to rule out endocarditis - currently not stable.  Persistent Afib  On coumadin at home with fluctuating INR  INR now 8.72->7.19->3.20 ->5.49 -> 1.70  Coumadin on hold due to high INR and severe thrombocytopenia and traumatic intubation  Coagulopathy   INR level high 8.72->7.19->3.20 ->5.49 ->1.70  Received multiple dose of VitK  Close monitoring  Peripheral smear no schistocytes   Thrombocytopenia  Platelet 61->34 ->20 -> 28  Avoid ASA for now  Close  monitoring  Bleeding precautions  Hypertension  BP on the low side  IVF and pressors BP goal normotensive  Hyperlipidemia  Home meds:  Zocor 20 mg daily resumed in hospital  LDL 18, goal < 70  Hold off zocor due to low LDL and risk of bleeding  Diabetes  HgbA1c 6.9, goal < 7.0  Controlled  Tobacco abuse  Current smoker  Smoking cessation counseling provided  Pt is willing to quit  Other Stroke Risk Factors  Advanced age  ETOH use, advised to drink no more than 1 - 2 drink(s) a day  Obesity, Body mass index is 35.86 kg/m., recommend weight loss, diet and exercise as appropriate   Other Active Problems  Anemia - 9.4 -> 8.5 -> 8.4  ESRD on HD - change to CRRT   Hospital day # 4  This patient is critically ill due to respiratory distress, coagulopathy due to high INR, afib with RVR on coumadin, bacteremia, thrombocytopenia and at significant risk of neurological worsening, death form bleeding, heart failure, sepsis, shock. This patient's care requires constant monitoring of vital signs, hemodynamics,  respiratory and cardiac monitoring, review of multiple databases, neurological assessment, discussion with family, other specialists and medical decision making of high complexity. I spent 35 minutes of neurocritical care time in the care of this patient.  Neurology will follow up peripherally. Please call us back once TEE done. Thanks for the consult.   Rosalin Hawking, MD PhD Stroke Neurology 04/16/2016 12:36 PM    To contact Stroke Continuity provider, please refer to [email or web address] After hours, contact General Neurology

## 2016-04-20 NOTE — Progress Notes (Signed)
PULMONARY / CRITICAL CARE MEDICINE   Name: Johnny Sims MRN: [id number] DOB: March 28, 1956    ADMISSION DATE:  04/14/2016 CONSULTATION DATE:  04/19/2016  REFERRING MD:  Dr. Thayer Headings  CHIEF COMPLAINT:  AMS now hypotension  HISTORY OF PRESENT ILLNESS:   60 year old male with PMH as below, which is significant for ESRD on HD TTS, DM, COPD, PAF on warfarin, and dCHF. He was recently 10/2 admitted for hypervolemia in the setting of progression of his CKD at which point HD was initiated through newly placed tunneled catheter. Once euvolemic (4 HD treatments) he was discharged 10/7. 10/19 he presented to ED post fall and was found to have INR elevation, but with the exception with some bruising he was otherwise stable and was discharged. 10/21 he again presented to [facility] ED, this time with altered mental status and slurred speech.  There was concern for CVA as he presented with AF-RVR. But INR found to be elevated at 8.  MRI demonstrated tiny R frontal lobe infarct. Not clear whether this is entirely the cause of his altered mental status. There was also concern for sepsis in setting of leukopenia and CXR infiltrate so he was started on cefepime and vancomycin. Initial blood cultures started to grow GNR and tunneled HD cath was pulled 10/22. Turned out to be pseudomonas putida pan-sensitive. Repeat cultures on 10/23 with MRSA. ABX remain Vancomycin and Zosyn. TTE did not describe vegetation. 10/24 he became hypotensive and airway protection worsened. PCCM asked to see.   SUBJECTIVE:  Remains on levo 18. On amio drip for a fib. Failed CPAP trial this a.m. Combative, requiring fentanyl per nurse, who requests continuous orders for bolusing.   VITAL SIGNS: BP 124/69   Pulse (!) 129   Temp 98.7 F (37.1 C) (Oral)   Resp 13   Ht 6\' 2"  (1.88 m)   Wt 126.7 kg (279 lb 4.8 oz)   SpO2 100%   BMI 35.86 kg/m   HEMODYNAMICS:    VENTILATOR SETTINGS: Vent Mode: PRVC FiO2 (%):  [40 %-100 %] 40 % Set  Rate:  [14 bmp-18 bmp] 18 bmp Vt Set:  [660 mL] 660 mL PEEP:  [5 cmH20] 5 cmH20 Plateau Pressure:  [14 cmH20-18 cmH20] 14 cmH20  INTAKE / OUTPUT: I/O last 3 completed shifts: In: 1403.7 [I.V.:1303.7; IV Piggyback:100] Out: -   PHYSICAL EXAMINATION: General:  Chronically ill appearing male, ETT in place Neuro:  Opens eyes, following some commands (wiggles toes) HEENT:  [place]/AT, PERRL Cardiovascular: Irregularly irregular, 3/6 systolic murmur. 3+ LE to sacrum Lungs:  Coarse breath sounds and referred upper airway sounds Abdomen:  Soft non-tender Musculoskeletal: No acute deformity Skin:  Grossly intact. PVD changes to LE. Oozing central line of left neck.   LABS:  BMET  Recent Labs Lab 04/19/16 1655 04/19/16 1700 03/31/2016 0305  NA 133* 133* 132*  K 5.2* 5.2* 5.4*  CL 95* 95* 93*  CO2 24 24 24   BUN 95* 95* 102*  CREATININE 5.65* 5.64* 5.65*  GLUCOSE 175* 178* 213*    Electrolytes  Recent Labs Lab 04/19/16 1655 04/19/16 1700 04/01/2016 0305  CALCIUM 8.0* 8.1* 8.1*  MG 2.1  --  2.2  PHOS 6.5* 6.6* 6.4*    CBC  Recent Labs Lab 04/19/16 0259 04/19/16 1038 04/19/16 1655 04/18/2016 0305  WBC 14.6*  --  18.8* 18.9*  HGB 8.5*  --  8.3* 8.4*  HCT 26.2*  --  25.3* 24.9*  PLT 28* 20* 25* 28*  Coag's  Recent Labs Lab 04/19/16 0259 04/19/16 1038 03/29/2016 0305  APTT  --  51*  --   INR 5.49* 2.84 1.70    Sepsis Markers  Recent Labs Lab 04/10/2016 1243 04/19/16 1655 04/19/16 1933  LATICACIDVEN 2.4* 1.5 1.3    ABG  Recent Labs Lab 04/19/16 1651 04/14/2016 0347  PHART 7.253* 7.342*  PCO2ART 55.5* 43.8  PO2ART 113.0* 122*    Liver Enzymes  Recent Labs Lab 04/21/2016 0433  04/19/16 1038 04/19/16 1700 04/04/2016 0305  AST 42*  --  47*  --   --   ALT 29  --  47  --   --   ALKPHOS 63  --  101  --   --   BILITOT 1.1  --  1.6*  --   --   ALBUMIN 2.8*  < > 1.8* 1.7* 1.6*  < > = values in this interval not displayed.  Cardiac Enzymes  Recent  Labs Lab 04/19/16 1655 04/19/16 2018 04/13/2016 0305  TROPONINI 0.71* 1.54* 0.28*    Glucose  Recent Labs Lab 04/19/16 0817 04/19/16 1255 04/19/16 2034 04/19/16 2355 04/11/2016 0350 04/21/2016 0736  GLUCAP 173* 225* 190* 193* 195* 192*    Imaging Dg Chest Port 1 View  Result Date: 04/02/2016 CLINICAL DATA:  Endotracheal intubation. EXAM: PORTABLE CHEST 1 VIEW COMPARISON:  Radiographs of April 19, 2016. FINDINGS: Stable cardiomegaly. Endotracheal and nasogastric tubes are unchanged in position. Left internal jugular catheter is unchanged with distal tip in expected position of the SVC. No pneumothorax is noted. Right lung is clear. Left lung base is not entirely included in field-of-view, but left basilar opacity is noted concerning for pneumonia or possibly atelectasis. Bony thorax is unremarkable. IMPRESSION: Stable support apparatus. Left lung base not completely included in field-of-view, but left basilar opacity is noted concerning for atelectasis or pneumonia. Electronically Signed   By: Marijo Conception, M.D.   On: 04/16/2016 07:39   Dg Chest Port 1 View  Result Date: 04/19/2016 CLINICAL DATA:  Status post intubation and IJ catheter placement today. EXAM: PORTABLE CHEST 1 VIEW COMPARISON:  Single-view of the chest 04/17/2016. FINDINGS: Right IJ dialysis catheter has been removed. New endotracheal tube is in place with tip in good position at the level of the clavicular heads. Left IJ approach central venous catheter tip projects in the mid to lower superior vena cava. New NG tube is in place with the tip in the fundus of the stomach. There is no pneumothorax. Left basilar airspace opacity is increased since the prior examination. No pleural effusion. There is cardiomegaly. IMPRESSION: Support apparatus projects in good position. Negative for pneumothorax. Increased airspace opacity in left mid and lower lung zones worrisome for pneumonia. Electronically Signed   By: Inge Rise  M.D.   On: 04/19/2016 18:04   STUDIES:  Ct head 10/21 > RIGHT facial contusion. No acute intracranial process. Mild chronic small vessel ischemic Disease. 19 mm LEFT thyroid nodule for which follow up thyroid sonogram is recommended on a nonemergent basis. ECHO 10/23 > LVEF 50-55%; mild LVH, R atrium mildly dilated, mild aortic regurg, mod-severe mitral regurg, PA pressure 45  CULTURES: BCx2 10/21 > Pseudomonas putida BCx2 10/23 > Coag neg staph>>> Catheter tip 10/22 > NGTD >>>  ANTIBIOTICS: Zosyn 10/21 >>> Vancomycin 10/21 >>>  SIGNIFICANT EVENTS:   LINES/TUBES: ETT 10/24 > L IJ CVC 10/24 >  PIVs x2 R IJ Trialysis catheter  DISCUSSION: 60 year old male with newly converted ESRD. Admitted 10/21 with  AMS and found to have small CVA and now bacteremia likely from tunneled HD cath. On broad spectrum antibiotics, but clinical status deteriorated. 10/24 transferred to ICU for intubation, pressors. Will need trialysis cath.   ASSESSMENT / PLAN:  PULMONARY A: Acute hypoxemic respiratory failure secondary to aspiration PNA COPD without acute exacerbation Improved respiratory acidosis P:   Full ventilator support Hold weaning for today Daily CXRs while intubated ABG VAP bundle  CARDIOVASCULAR A:  Shock - likely septic vs cardiogenic with severe MR PAF on warfarin H/o hypotension, HLD  P:  Telemetry monitoring MAP goal > 47mmHg KVO IVF as he appears volume overloaded Levophed infusion for MAP goal, maintain at 18 for now but titrate TEE pending Holding home antihypertensives  RENAL A:   ESRD on HD TTS  P:   Nephrology following  Place trialysis cath with platelets CRRT today once access is placed  GASTROINTESTINAL A:   Hepatitis C GERD  P:   NPO LFTs  Order nutrition consult for TFs Pepcid daily for SUP  HEMATOLOGIC A:   Thrombocytopenia, severe --> stable Warfarin coagulopathy Anemia  P:  Holding coumadin and anticoagulation altogether. Trend  CBC and coags Transfuse for Hgb < 7  INFECTIOUS A:   Bacteremia MRSA and Pseudomonas Putida Likely aspiration PNA Need to rule out infective endocarditis  P:   Continue Zosyn and Vancomycin  Continue to follow cultures  ENDOCRINE A:   DM Hypothyroid with low TSH CBGs in 200s  P:   CBG monitoring and SSI Consider insulin gtt  NEUROLOGIC A:   Acute metabolic encephalopathy CVA Bipolar disorder P:   RASS goal: 0 to -1 Intermittent fentanyl/versed Stroke team following  FAMILY  - Updates: Brother updated  - Inter-disciplinary family meet or Palliative Care meeting due by: 10/31  The patient is critically ill with multiple organ systems failure and requires high complexity decision making for assessment and support, frequent evaluation and titration of therapies, application of advanced monitoring technologies and extensive interpretation of multiple databases.   Critical Care Time devoted to patient care services described in this note is  35  Minutes. This time reflects time of care of this signee Dr [name]. This critical care time does not reflect procedure time, or teaching time or supervisory time of PA/NP/Med student/Med Resident etc but could involve care discussion time.  Rush Farmer, M.D. [facility] Pulmonary/Critical Care Medicine. Pager: [phone]. After hours pager: [phone].

## 2016-04-20 NOTE — Procedures (Signed)
Hemodialysis Catheter Insertion Procedure Note Johnny Sims [id number] 19-Nov-1955  Procedure: Insertion of Hemodialysis Catheter Indications: Hemodialysis  Procedure Details Consent: Unable to obtain consent because of emergent medical necessity.  Attempted to reach family, unable.    Time Out: Verified patient identification, verified procedure, site/side was marked, verified correct patient position, special equipment/implants available, medications/allergies/relevent history reviewed, required imaging and test results available.  Performed  Maximum sterile technique was used including antiseptics, cap, gloves, gown, hand hygiene, mask and sheet.  Skin prep: Chlorhexidine; local anesthetic administered  A Trialysis HD catheter was placed in the right internal jugular vein using the Seldinger technique.  Evaluation Blood flow good Complications: No apparent complications Patient did tolerate procedure well. Chest X-Aycen ordered to verify placement.  CXR: pending.   Procedure performed under direct supervision of Dr. Nelda Marseille and with ultrasound guidance for real time vessel cannulation.     Johnny Gens, NP-C [place] Pgr: [phone] or [id number] 04/02/2016, 1:24 PM  Rush Farmer, M.D. [facility] Pulmonary/Critical Care Medicine. Pager: [phone]. After hours pager: [phone].

## 2016-04-20 NOTE — Progress Notes (Signed)
Pt Central Line oozing on/off throughout the night. Dr. Dallas Schimke at bedside to assess. RN will continue to monitor.   Arnell Sieving, RN

## 2016-04-21 ENCOUNTER — Inpatient Hospital Stay (HOSPITAL_COMMUNITY): Payer: Medicaid Other

## 2016-04-21 DIAGNOSIS — G9340 Encephalopathy, unspecified: Secondary | ICD-10-CM

## 2016-04-21 LAB — GLUCOSE, CAPILLARY
GLUCOSE-CAPILLARY: 239 mg/dL — AB (ref 65–99)
GLUCOSE-CAPILLARY: 251 mg/dL — AB (ref 65–99)
GLUCOSE-CAPILLARY: 151 mg/dL — AB (ref 65–99)
Glucose-Capillary: 205 mg/dL — ABNORMAL HIGH (ref 65–99)
Glucose-Capillary: 219 mg/dL — ABNORMAL HIGH (ref 65–99)
Glucose-Capillary: 270 mg/dL — ABNORMAL HIGH (ref 65–99)

## 2016-04-21 LAB — RENAL FUNCTION PANEL
ALBUMIN: 1.6 g/dL — AB (ref 3.5–5.0)
ALBUMIN: 1.6 g/dL — AB (ref 3.5–5.0)
Anion gap: 14 (ref 5–15)
Anion gap: 10 (ref 5–15)
BUN: 85 mg/dL — AB (ref 6–20)
BUN: 62 mg/dL — AB (ref 6–20)
CALCIUM: 8.4 mg/dL — AB (ref 8.9–10.3)
CO2: 24 mmol/L (ref 22–32)
CO2: 26 mmol/L (ref 22–32)
CREATININE: 4.27 mg/dL — AB (ref 0.61–1.24)
CREATININE: 2.99 mg/dL — AB (ref 0.61–1.24)
Calcium: 8.3 mg/dL — ABNORMAL LOW (ref 8.9–10.3)
Chloride: 96 mmol/L — ABNORMAL LOW (ref 101–111)
Chloride: 100 mmol/L — ABNORMAL LOW (ref 101–111)
GFR calc Af Amer: 16 mL/min — ABNORMAL LOW (ref >60)
GFR calc Af Amer: 25 mL/min — ABNORMAL LOW (ref >60)
GFR, EST NON AFRICAN AMERICAN: 14 mL/min — AB (ref >60)
GFR, EST NON AFRICAN AMERICAN: 21 mL/min — AB (ref >60)
GLUCOSE: 215 mg/dL — AB (ref 65–99)
Glucose, Bld: 254 mg/dL — ABNORMAL HIGH (ref 65–99)
PHOSPHORUS: 5 mg/dL — AB (ref 2.5–4.6)
PHOSPHORUS: 3.6 mg/dL (ref 2.5–4.6)
POTASSIUM: 4.9 mmol/L (ref 3.5–5.1)
Potassium: 5.3 mmol/L — ABNORMAL HIGH (ref 3.5–5.1)
SODIUM: 136 mmol/L (ref 135–145)
Sodium: 134 mmol/L — ABNORMAL LOW (ref 135–145)

## 2016-04-21 LAB — CULTURE, BLOOD (ROUTINE X 2)

## 2016-04-21 LAB — CBC
HCT: 25.6 % — ABNORMAL LOW (ref 39.0–52.0)
HEMOGLOBIN: 8.5 g/dL — AB (ref 13.0–17.0)
MCH: 28.9 pg (ref 26.0–34.0)
MCHC: 33.2 g/dL (ref 30.0–36.0)
MCV: 87.1 fL (ref 78.0–100.0)
PLATELETS: 43 10*3/uL — AB (ref 150–400)
RBC: 2.94 MIL/uL — ABNORMAL LOW (ref 4.22–5.81)
RDW: 18.9 % — ABNORMAL HIGH (ref 11.5–15.5)
WBC: 18.6 10*3/uL — ABNORMAL HIGH (ref 4.0–10.5)

## 2016-04-21 LAB — PREPARE PLATELET PHERESIS: Unit division: 0

## 2016-04-21 LAB — BLOOD GAS, ARTERIAL
Acid-base deficit: 1.4 mmol/L (ref 0.0–2.0)
Bicarbonate: 24.2 mmol/L (ref 20.0–28.0)
DRAWN BY: 31101
FIO2: 40
O2 Saturation: 98.9 %
PATIENT TEMPERATURE: 98.6
PCO2 ART: 50.9 mmHg — AB (ref 32.0–48.0)
PEEP: 5 cmH2O
RATE: 18 resp/min
VT: 660 mL
pH, Arterial: 7.298 — ABNORMAL LOW (ref 7.350–7.450)
pO2, Arterial: 147 mmHg — ABNORMAL HIGH (ref 83.0–108.0)

## 2016-04-21 LAB — PROTIME-INR
INR: 2.17
PROTHROMBIN TIME: 24.5 s — AB (ref 11.4–15.2)

## 2016-04-21 LAB — APTT: aPTT: 42 seconds — ABNORMAL HIGH (ref 24–36)

## 2016-04-21 LAB — MAGNESIUM: MAGNESIUM: 2.3 mg/dL (ref 1.7–2.4)

## 2016-04-21 MED ORDER — SODIUM CHLORIDE 0.9 % IV SOLN
1.0000 mg/h | INTRAVENOUS | Status: DC
  Administered 2016-04-21: 5 mg/h via INTRAVENOUS
  Administered 2016-04-22: 10 mg/h via INTRAVENOUS
  Administered 2016-04-22: 6 mg/h via INTRAVENOUS
  Administered 2016-04-22: 5 mg/h via INTRAVENOUS
  Administered 2016-04-23: 10 mg/h via INTRAVENOUS
  Administered 2016-04-23 – 2016-04-25 (×8): 8 mg/h via INTRAVENOUS
  Administered 2016-04-25 – 2016-04-26 (×2): 10 mg/h via INTRAVENOUS
  Administered 2016-04-26: 5 mg/h via INTRAVENOUS
  Filled 2016-04-21 (×17): qty 10

## 2016-04-21 MED ORDER — INSULIN GLARGINE 100 UNIT/ML ~~LOC~~ SOLN
15.0000 [IU] | Freq: Every day | SUBCUTANEOUS | Status: DC
  Administered 2016-04-21 – 2016-04-22 (×2): 15 [IU] via SUBCUTANEOUS
  Filled 2016-04-21 (×2): qty 0.15

## 2016-04-21 MED ORDER — MIDAZOLAM HCL 2 MG/2ML IJ SOLN
1.0000 mg | INTRAMUSCULAR | Status: DC | PRN
  Administered 2016-04-21 (×9): 1 mg via INTRAVENOUS
  Filled 2016-04-21 (×8): qty 2

## 2016-04-21 NOTE — Progress Notes (Signed)
SLP Cancellation Note  Patient Details Name: Johnny Sims MRN: [id number] DOB: 06/20/1956   Cancelled treatment:       Reason Eval/Treat Not Completed: Medical issues which prohibited therapy (pt remains on full vent support this morning).   Germain Osgood 04/21/2016, 9:05 AM  Germain Osgood, M.A. CCC-SLP [phone]

## 2016-04-21 NOTE — Progress Notes (Signed)
Patient Name: Johnny Sims Date of Encounter: 04/21/2016  Primary Cardiologist: Mertie Moores, MD  Hospital Problem List     Active Problems:   Hyperlipidemia   Essential hypertension   GERD (gastroesophageal reflux disease)   Atrial fibrillation status post cardioversion [facility])   Type 2 diabetes mellitus with diabetic polyneuropathy, with long-term current use of insulin (HCC)   ESRD on dialysis ([place])   Weakness   Slurred speech   Leukopenia   Fall   Pressure injury of skin   CVA (cerebral vascular accident) ([place])   Contusion of face   Gram-negative bacteremia   Acute respiratory failure with hypoxia ([place])   Septic shock ([place])   Endotracheally intubated     Subjective   Intubated.  Inpatient Medications    Scheduled Meds: . sodium chloride   Intravenous Once  . calcitRIOL  0.5 mcg Oral Q T,Th,Sa-HD  . calcium acetate  2,001 mg Oral TID WC  . chlorhexidine gluconate (MEDLINE KIT)  15 mL Mouth Rinse BID  . darbepoetin (ARANESP) injection - DIALYSIS  200 mcg Intravenous Q Thu-HD  . feeding supplement (PRO-STAT SUGAR FREE 64)  30 mL Per Tube TID  . feeding supplement (VITAL HIGH PROTEIN)  1,000 mL Per Tube Q24H  . ferric gluconate (FERRLECIT/NULECIT) IV  125 mg Intravenous Q T,Th,Sa-HD  . heparin  40 Units/kg Dialysis Once in dialysis  . insulin aspart  0-15 Units Subcutaneous Q4H  . levothyroxine  88 mcg Oral QAC breakfast  . mouth rinse  15 mL Mouth Rinse QID  . multivitamin  1 tablet Oral QHS  . pantoprazole sodium  40 mg Per Tube Daily  . phytonadione  10 mg Oral Once  . piperacillin-tazobactam  3.375 g Intravenous Q6H  . sodium chloride  250 mL Intravenous Once  . sodium chloride flush  3 mL Intravenous Q12H  . tiotropium  18 mcg Inhalation Daily  . vancomycin  1,250 mg Intravenous Q24H  . Warfarin - Pharmacist Dosing Inpatient   Does not apply q1800   Continuous Infusions: . amiodarone 30 mg/hr (04/21/16 0800)  . fentaNYL infusion INTRAVENOUS 350  mcg/hr (04/21/16 0800)  . norepinephrine (LEVOPHED) Adult infusion 10 mcg/min (04/21/16 0800)  . dialysis replacement fluid (prismasate) 800 mL/hr at 04/21/16 0730  . dialysis replacement fluid (prismasate) 700 mL/hr at 04/21/16 0911  . dialysate (PRISMASATE) 2,000 mL/hr at 04/21/16 0757   PRN Meds: sodium chloride, sodium chloride, alteplase, fentaNYL, heparin, heparin, ipratropium-albuterol, lidocaine (PF), lidocaine-prilocaine, midazolam, oxyCODONE, pentafluoroprop-tetrafluoroeth, sodium chloride   Vital Signs    Vitals:   04/21/16 0645 04/21/16 0700 04/21/16 0758 04/21/16 0800  BP: (!) 89/69 108/61  139/87  Pulse: 65 (!) 39  89  Resp: '18 18  18  '$ Temp:   97.3 F (36.3 C)   TempSrc:   Oral   SpO2: 100% 100%  100%  Weight:      Height:        Intake/Output Summary (Last 24 hours) at 04/21/16 0939 Last data filed at 04/21/16 0900  Gross per 24 hour  Intake          2620.62 ml  Output             2800 ml  Net          -179.38 ml   Filed Weights   04/19/16 0345 04/24/2016 0354 04/21/16 0500  Weight: 289 lb 9.6 oz (131.4 kg) 279 lb 4.8 oz (126.7 kg) 283 lb 8.2 oz (128.6 kg)    Physical  Exam    GEN: Obese and on ventilator. HEENT: Grossly normal.  Neck: Supple, no JVD, carotid bruits, or masses. Cardiac: IIRR, no murmurs, rubs, or gallops. No clubbing, cyanosis, edema.  Radials/DP/PT 2+ and equal bilaterally.  Respiratory:  Respirations regular and unlabored, clear to auscultation bilaterally. GI: Soft, nontender, nondistended, BS + x 4. MS: no deformity or atrophy. Skin: warm and dry, no rash. Neuro:  Sedated Psych: Sedated  Labs    CBC  Recent Labs  04/10/2016 0305 04/21/16 0340  WBC 18.9* 18.6*  HGB 8.4* 8.5*  HCT 24.9* 25.6*  MCV 86.8 87.1  PLT 28* 43*   Basic Metabolic Panel  Recent Labs  04/17/2016 0305 04/10/2016 2015 04/21/16 0340  NA 132* 133* 134*  K 5.4* 5.7* 5.3*  CL 93* 94* 96*  CO2 '24 23 24  '$ GLUCOSE 213* 215* 254*  BUN 102* 104* 85*    CREATININE 5.65* 5.40* 4.27*  CALCIUM 8.1* 8.1* 8.3*  MG 2.2  --  2.3  PHOS 6.4* 6.3* 5.0*   Liver Function Tests  Recent Labs  04/19/16 1038  04/23/2016 2015 04/21/16 0340  AST 47*  --   --   --   ALT 47  --   --   --   ALKPHOS 101  --   --   --   BILITOT 1.6*  --   --   --   PROT 5.2*  --   --   --   ALBUMIN 1.8*  < > 1.6* 1.6*  < > = values in this interval not displayed. No results for input(s): LIPASE, AMYLASE in the last 72 hours. Cardiac Enzymes  Recent Labs  04/19/16 1655 04/19/16 2018 04/15/2016 0305  TROPONINI 0.71* 1.54* 0.28*   BNP Invalid input(s): POCBNP D-Dimer  Recent Labs  04/19/16 1038  DDIMER 1.12*   Hemoglobin A1C No results for input(s): HGBA1C in the last 72 hours. Fasting Lipid Panel No results for input(s): CHOL, HDL, LDLCALC, TRIG, CHOLHDL, LDLDIRECT in the last 72 hours. Thyroid Function Tests No results for input(s): TSH, T4TOTAL, T3FREE, THYROIDAB in the last 72 hours.  Invalid input(s): FREET3  Telemetry    Improved rate control with average the ER approximately 100 bpm. - Personally Reviewed  ECG    No new data   Radiology    Dg Chest Port 1 View  Result Date: 04/21/2016 CLINICAL DATA:  Endotracheal tube EXAM: PORTABLE CHEST 1 VIEW COMPARISON:  Yesterday FINDINGS: Endotracheal tube tip is at the clavicular heads. Bilateral IJ central line with tips at the upper SVC. Orogastric tube reaches the diaphragm. Cardiopericardial enlargement. Improved right lung aeration. Hazy opacity at the left base with subtle volume loss. No pneumothorax. IMPRESSION: 1. Improving vascular congestion. Lung opacity is now asymmetric to the left, possible pneumonia. 2. Stable tube and line positioning. Electronically Signed   By: Monte Fantasia M.D.   On: 04/21/2016 07:23   Dg Chest Port 1 View  Result Date: 04/23/2016 CLINICAL DATA:  Encounter for central line placement. EXAM: PORTABLE CHEST 1 VIEW COMPARISON:  Radiograph of April 20, 2016.  FINDINGS: Stable cardiomegaly. Endotracheal and nasogastric tubes are in grossly good position. Left internal jugular catheter is unchanged with distal tip in expected position of the SVC. Interval placement of right internal jugular catheter with distal tip in expected position of the SVC. No pneumothorax is noted. Mild bibasilar subsegmental atelectasis is noted. Bony thorax is unremarkable. IMPRESSION: Interval placement of right internal jugular catheter with distal tip in expected position  of the SVC. Mild bibasilar subsegmental atelectasis is noted. Electronically Signed   By: Marijo Conception, M.D.   On: 04/10/2016 14:27   Dg Chest Port 1 View  Result Date: 04/13/2016 CLINICAL DATA:  Endotracheal intubation. EXAM: PORTABLE CHEST 1 VIEW COMPARISON:  Radiographs of April 19, 2016. FINDINGS: Stable cardiomegaly. Endotracheal and nasogastric tubes are unchanged in position. Left internal jugular catheter is unchanged with distal tip in expected position of the SVC. No pneumothorax is noted. Right lung is clear. Left lung base is not entirely included in field-of-view, but left basilar opacity is noted concerning for pneumonia or possibly atelectasis. Bony thorax is unremarkable. IMPRESSION: Stable support apparatus. Left lung base not completely included in field-of-view, but left basilar opacity is noted concerning for atelectasis or pneumonia. Electronically Signed   By: Marijo Conception, M.D.   On: 03/31/2016 07:39   Dg Chest Port 1 View  Result Date: 04/19/2016 CLINICAL DATA:  Status post intubation and IJ catheter placement today. EXAM: PORTABLE CHEST 1 VIEW COMPARISON:  Single-view of the chest 04/17/2016. FINDINGS: Right IJ dialysis catheter has been removed. New endotracheal tube is in place with tip in good position at the level of the clavicular heads. Left IJ approach central venous catheter tip projects in the mid to lower superior vena cava. New NG tube is in place with the tip in the  fundus of the stomach. There is no pneumothorax. Left basilar airspace opacity is increased since the prior examination. No pleural effusion. There is cardiomegaly. IMPRESSION: Support apparatus projects in good position. Negative for pneumothorax. Increased airspace opacity in left mid and lower lung zones worrisome for pneumonia. Electronically Signed   By: Inge Rise M.D.   On: 04/19/2016 18:04    Cardiac Studies   No new data  Patient Profile     Mr. Schewe is a 60 year old male with a past medical history of chronic atrial fibrillation (On Warfarin), ESRD on HD (recently started HD earlier this month), DM, Hepatitis C, HTN, and COPD. He presented to the ED on 04/05/2016 after a fall at home, found to have small right frontal lobe infarct and INR of 8 with thrombocytopenia.Has chronic afib And noted to have rapid ventricular response on admission. Aspiration pneumonia, and bacteremia he was intubated on 04/19/16. Overall prognosis is poor.  Assessment & Plan    1. Chronic atrial fibrillation with rapid ventricular response, improving with IV amiodarone and intermittent bolus doses of digoxin. Dig level <0.2. With better rate control today, plan no additional digoxin but to continue amiodarone IV until clinical status improves.  Signed, Sinclair Grooms, MD  04/21/2016, 9:39 AM

## 2016-04-21 NOTE — Progress Notes (Signed)
Inpatient Diabetes Program Recommendations  AACE/ADA: New Consensus Statement on Inpatient Glycemic Control (2015)  Target Ranges:  Prepandial:   less than 140 mg/dL      Peak postprandial:   less than 180 mg/dL (1-2 hours)      Critically ill patients:  140 - 180 mg/dL   Results for Spaugh, CLEARNCE CAMISA (MRN [id number]) as of 04/21/2016 10:08  Ref. Range 04/06/2016 15:35 04/13/2016 20:05 04/06/2016 23:26 04/21/2016 04:03 04/21/2016 07:56  Glucose-Capillary Latest Ref Range: 65 - 99 mg/dL 198 (H) 222 (H) 228 (H) 239 (H) 251 (H)   Diabetes history: Type 2 diabetes Outpatient Diabetes medications: Lantus 30 units q HS Current orders for Inpatient glycemic control:  Novolog sensitive q 4 hours  Inpatient Diabetes Program Recommendations:    Glucose trends in the 200's. Please consider adding Lantus 15 units daily (1/2 of home dose).   Thanks,  Tama Headings RN, MSN, [facility] Inpatient Diabetes Coordinator Team Pager [phone] (8a-5p)

## 2016-04-21 NOTE — Progress Notes (Signed)
PULMONARY / CRITICAL CARE MEDICINE   Name: Johnny Sims MRN: [id number] DOB: 08-18-1955    ADMISSION DATE:  03/29/2016 CONSULTATION DATE:  04/19/2016  REFERRING MD:  Dr. Thayer Headings  CHIEF COMPLAINT:  AMS now hypotension  HISTORY OF PRESENT ILLNESS:   60 year old male with PMH as below, which is significant for ESRD on HD TTS, DM, COPD, PAF on warfarin, and dCHF. He was recently 10/2 admitted for hypervolemia in the setting of progression of his CKD at which point HD was initiated through newly placed tunneled catheter. Once euvolemic (4 HD treatments) he was discharged 10/7. 10/19 he presented to ED post fall and was found to have INR elevation, but with the exception with some bruising he was otherwise stable and was discharged. 10/21 he again presented to [facility] ED, this time with altered mental status and slurred speech.  There was concern for CVA as he presented with AF-RVR. But INR found to be elevated at 8.  MRI demonstrated tiny R frontal lobe infarct. Not clear whether this is entirely the cause of his altered mental status. There was also concern for sepsis in setting of leukopenia and CXR infiltrate so he was started on cefepime and vancomycin. Initial blood cultures started to grow GNR and tunneled HD cath was pulled 10/22. Turned out to be pseudomonas putida pan-sensitive. Repeat cultures on 10/23 with MRSA. ABX remain Vancomycin and Zosyn. TTE did not describe vegetation. 10/24 he became hypotensive and airway protection worsened. PCCM asked to see.   PAST MEDICAL HISTORY :  He  has a past medical history of Anxiety; Asthma; Bipolar 1 disorder ([place]); Chronic pain; CKD (chronic kidney disease), stage II; COPD (chronic obstructive pulmonary disease) ([place]); Diabetes mellitus; Fibromyalgia; GERD (gastroesophageal reflux disease); Hepatitis C; Hepatitis C; Hyperlipidemia; Hypertension; Hypothyroidism; Melanoma ([place]); OA (osteoarthritis); PAF (paroxysmal atrial fibrillation) ([place]);  Pancreatitis, acute (2015); RA (rheumatoid arthritis) ([place]); and Venous stasis.  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  Allergies  Allergen Reactions  . Hydrocodone-Acetaminophen Palpitations and Other (See Comments)    Doesn't want to take  . Iodides Other (See Comments)    Patient states he doesn't know what iodine is and doesn't think he is allergic to it despite it being listed with his allergies  . Other Hives and Other (See Comments)    Steroid creams "Corndogs"- Nausea & vomiting   . Sulfacetamide Sodium Hives and Itching  . Tylenol [Acetaminophen] Nausea And Vomiting  . Gabapentin Swelling    SWELLING REACTION UNSPECIFIED   . Antihistamines, Chlorpheniramine-Type Other (See Comments)    unknown  . Hydralazine Rash  . Iodinated Diagnostic Agents Other (See Comments)    Patient states he doesn't know what iodine is and doesn't think he is allergic to it despite it being listed with his allergies  . Latex Rash  . Lidocaine Itching and Other (See Comments)    Patient is uncertain of this allergy  . Pentazocine Other (See Comments)    unknown  . Pheniramine Other (See Comments)    unknown  . Sulfa Antibiotics Itching and Other (See Comments)  . Tape Rash  . Vicodin [Hydrocodone-Acetaminophen] Palpitations and Other (See Comments)  Doesn't want to take    No current facility-administered medications on file prior to encounter.    Current Outpatient Prescriptions on File Prior to Encounter   Medication Sig  . albuterol (PROVENTIL) (2.5 MG/3ML) 0.083% nebulizer solution Take 2.5 mg by nebulization 4 (four) times daily.  . calcium acetate (PHOSLO) 667 MG capsule Take 2 capsules (1,334 mg total) by mouth 3 (three) times daily with meals.  . cholecalciferol (VITAMIN D) 1000 units tablet Take 1,000 Units by mouth daily.  Marland Kitchen COUMADIN 10 MG tablet Take as directed by the Coumadin clinic. (Patient taking differently: Take 10 mg by mouth See admin instructions. Mon/Tues/Wed/Fri/Sat (in the evening))  . COUMADIN 7.5 MG tablet Take as directed by Coumadin clinic. (Patient taking differently: Take 7.5 mg by mouth. Sun/Thurs (in the evening))  . Darbepoetin Alfa (ARANESP) 100 MCG/0.5ML SOSY injection Inject 0.5 mLs (100 mcg total) into the vein every Tuesday with hemodialysis.  Marland Kitchen diazepam (VALIUM) 10 MG tablet Take 10 mg by mouth 4 (four) times daily.   . hydrALAZINE (APRESOLINE) 50 MG tablet Take 1 tablet (50 mg total) by mouth 3 (three) times daily.  . Insulin Glargine (LANTUS SOLOSTAR) 100 UNIT/ML Solostar Pen Inject 30 Units into the skin daily at 10 pm.  . levothyroxine (SYNTHROID) 88 MCG tablet Take 1 tablet (88 mcg total) by mouth daily before breakfast. Will send additional refills based off blood level (Patient taking differently: Take 88 mcg by mouth daily before breakfast. )  . metoprolol (LOPRESSOR) 100 MG tablet Take 1 tablet (100 mg total) by mouth 2 (two) times daily. (Patient taking differently: Take 100-200 mg by mouth 2 (two) times daily. Take 2 tablets in the morning, and 1 tablet in the evening)  . multivitamin (RENA-VIT) TABS tablet Take 1 tablet by mouth at bedtime.  . nicotine (NICODERM CQ - DOSED IN MG/24 HOURS) 14 mg/24hr patch Place 14 mg onto the skin daily.  Marland Kitchen omeprazole (PRILOSEC) 20 MG capsule Take 1 capsule (20 mg total) by mouth daily. Take with Harvoni at the same time under FASTING condition (Patient taking differently: Take 20 mg by mouth daily. )  . oxyCODONE  (ROXICODONE) 15 MG immediate release tablet Take 1 tablet (15 mg total) by mouth every 3 (three) hours as needed for pain.  . simvastatin (ZOCOR) 20 MG tablet Take 1 tablet (20 mg total) by mouth daily at 6 PM.  . SPIRIVA HANDIHALER 18 MCG inhalation capsule INHALE CONTENTS OF ONE CAPSULE ONCE A DAY (Patient taking differently: INHALE CONTENTS OF ONE CAPSULE twice A DAY)  . psyllium (HYDROCIL/METAMUCIL) 95 % PACK Take 1 packet by mouth daily. (Patient not taking: Reported on 03/26/2016)  . [DISCONTINUED] Insulin Glulisine (APIDRA) 100 UNIT/ML Solostar Pen Inject 28 Units into the skin at bedtime.    FAMILY HISTORY:  His indicated that his mother is alive. He indicated that his father is deceased. He indicated that his brother is alive.    SOCIAL HISTORY: He  reports that he has been smoking Cigarettes.  He has a 43.00 pack-year smoking history. He has never used smokeless tobacco. He reports that he drinks alcohol. He reports that he does not use drugs.  REVIEW OF SYSTEMS:   unable  SUBJECTIVE:  Remains on levo 8. On amio drip for a fib at 30. Required increased fentanyl (now on 400) and versed overnight for agitation. 2.4 L taken off with CRRT yesterday. Currently on CRRT.   VITAL SIGNS: BP (!) 78/58   Pulse 80   Temp  97.5 F (36.4 C) (Oral)   Resp 18   Ht 6\' 2"  (1.88 m)   Wt 283 lb 8.2 oz (128.6 kg)   SpO2 100%   BMI 36.40 kg/m   HEMODYNAMICS: CVP:  [3 mmHg-4 mmHg] 4 mmHg  VENTILATOR SETTINGS: Vent Mode: PRVC FiO2 (%):  [40 %] 40 % Set Rate:  [18 bmp] 18 bmp Vt Set:  [660 mL] 660 mL PEEP:  [5 cmH20] 5 cmH20 Plateau Pressure:  [11 cmH20-18 cmH20] 18 cmH20  INTAKE / OUTPUT: I/O last 3 completed shifts: In: 1863.6 [I.V.:1532.2; Blood:120; NG/GT:61.4; IV Piggyback:150] Out: 0   PHYSICAL EXAMINATION: General:  Chronically ill appearing male, ETT in place Neuro:  Opens eyes, following some commands (tracks) HEENT:  [place]/AT, PERRL Cardiovascular: Irregularly irregular, 3/6  systolic murmur. 3+ LE to past knees Lungs:  Coarse breath sounds and referred upper airway sounds Abdomen:  Soft non-tender Musculoskeletal: No acute deformity Skin:  Grossly intact. PVD changes to LE. Oozing central line of left neck.   LABS:  BMET  Recent Labs Lab 04/15/2016 0305 04/05/2016 2015 04/21/16 0340  NA 132* 133* 134*  K 5.4* 5.7* 5.3*  CL 93* 94* 96*  CO2 24 23 24   BUN 102* 104* 85*  CREATININE 5.65* 5.40* 4.27*  GLUCOSE 213* 215* 254*    Electrolytes  Recent Labs Lab 04/19/16 1655  04/04/2016 0305 04/16/2016 2015 04/21/16 0340  CALCIUM 8.0*  < > 8.1* 8.1* 8.3*  MG 2.1  --  2.2  --  2.3  PHOS 6.5*  < > 6.4* 6.3* 5.0*  < > = values in this interval not displayed.  CBC  Recent Labs Lab 04/19/16 1655 04/13/2016 0305 04/21/16 0340  WBC 18.8* 18.9* 18.6*  HGB 8.3* 8.4* 8.5*  HCT 25.3* 24.9* 25.6*  PLT 25* 28* 43*    Coag's  Recent Labs Lab 04/19/16 1038 04/02/2016 0305 04/21/16 0340  APTT 51*  --  42*  INR 2.84 1.70 2.17    Sepsis Markers  Recent Labs Lab 04/23/2016 1243 04/19/16 1655 04/19/16 1933  LATICACIDVEN 2.4* 1.5 1.3    ABG  Recent Labs Lab 04/19/16 1651 04/06/2016 0347 04/21/16 0355  PHART 7.253* 7.342* 7.298*  PCO2ART 55.5* 43.8 50.9*  PO2ART 113.0* 122* 147*    Liver Enzymes  Recent Labs Lab 04/13/2016 0433  04/19/16 1038  04/23/2016 0305 04/24/2016 2015 04/21/16 0340  AST 42*  --  47*  --   --   --   --   ALT 29  --  47  --   --   --   --   ALKPHOS 63  --  101  --   --   --   --   BILITOT 1.1  --  1.6*  --   --   --   --   ALBUMIN 2.8*  < > 1.8*  < > 1.6* 1.6* 1.6*  < > = values in this interval not displayed.  Cardiac Enzymes  Recent Labs Lab 04/19/16 1655 04/19/16 2018 04/11/2016 0305  TROPONINI 0.71* 1.54* 0.28*    Glucose  Recent Labs Lab 04/18/2016 0736 03/30/2016 1211 04/15/2016 1535 04/25/2016 2005 04/17/2016 2326 04/21/16 0403  GLUCAP 192* 190* 198* 222* 228* 239*    Imaging Dg Chest Port 1  View  Result Date: 04/24/2016 CLINICAL DATA:  Encounter for central line placement. EXAM: PORTABLE CHEST 1 VIEW COMPARISON:  Radiograph of April 20, 2016. FINDINGS: Stable cardiomegaly. Endotracheal and nasogastric tubes are in grossly good position. Left internal jugular  catheter is unchanged with distal tip in expected position of the SVC. Interval placement of right internal jugular catheter with distal tip in expected position of the SVC. No pneumothorax is noted. Mild bibasilar subsegmental atelectasis is noted. Bony thorax is unremarkable. IMPRESSION: Interval placement of right internal jugular catheter with distal tip in expected position of the SVC. Mild bibasilar subsegmental atelectasis is noted. Electronically Signed   By: Marijo Conception, M.D.   On: 04/05/2016 14:27     STUDIES:  Ct head 10/21 > RIGHT facial contusion. No acute intracranial process. Mild chronic small vessel ischemic Disease. 19 mm LEFT thyroid nodule for which follow up thyroid sonogram is recommended on a nonemergent basis. ECHO 10/23 > LVEF 50-55%; mild LVH, R atrium mildly dilated, mild aortic regurg, mod-severe mitral regurg, PA pressure 45  CULTURES: BCx2 10/21 > Pseudomonas putida BCx2 10/23 > Methicillin resistant Staph Catheter tip 10/22 > no growth  ANTIBIOTICS: Zosyn 10/21 >>> Vancomycin 10/21 >>>  SIGNIFICANT EVENTS:   LINES/TUBES: ETT 10/24 > L IJ CVC 10/24 >  PIVs x2 HD cath RIJ 10/25 >  DISCUSSION: 60 year old male with newly converted ESRD. Admitted 10/21 with AMS and found to have small CVA and now bacteremia likely from tunneled HD cath. On broad spectrum antibiotics, but clinical status deteriorated. 10/24 transferred to ICU for intubation, pressors. Now with suspected aspiration PNA. HD cath replaced 10/25 and CRRT initiated.   ASSESSMENT / PLAN:  PULMONARY A: Acute hypoxemic respiratory failure secondary to aspiration PNA COPD without acute exacerbation Respiratory  acidosis P:   Full ventilator support Daily CXRs while intubated ABGs daily  VAP bundle  CARDIOVASCULAR A:  Shock - likely septic vs cardiogenic with severe MR PAF on warfarin and amiodarone gtt H/o hypotension, HLD  P:  Telemetry monitoring MAP goal > 33mmHg KVO IVF as he appears volume overloaded Levophed infusion for MAP goal TEE pending Holding home antihypertensives - start back metoprolol when off pressor for afib Continue amiodarone gtt  RENAL A:   ESRD on HD TTS + 3.1 L since admission  P:   Nephrology following, appreciate recommendations  GASTROINTESTINAL A:   Hepatitis C GERD  P:   NPO Intermittent LFTs  TFs Protonix daily for SUP  HEMATOLOGIC A:   Thrombocytopenia, severe --> stable Warfarin coagulopathy Anemia  P:  Holding coumadin and anticoagulation altogether. Trend CBC and coags Transfuse for Hgb < 7  INFECTIOUS A:   Bacteremia MRSA and Pseudomonas Putida - source possible LE cellulitis Likely aspiration PNA Need to rule out infective endocarditis  P:   Continue Zosyn and Vancomycin  Continue to follow cultures Obtain tracheal aspirate cx? TEE  ENDOCRINE A:   DM - Last A1c 6.9 on 04/17/16 Hypothyroid with low TSH CBGs in 200s  P:   CBG monitoring and SSI Consider insulin gtt  NEUROLOGIC A:   Acute metabolic encephalopathy CVA Bipolar disorder P:   RASS goal: 0 to -1 Fentanyl/versed Stroke team following   FAMILY  - Updates: Brother updated  - Inter-disciplinary family meet or Palliative Care meeting due by: 10/31  Olene Floss, MD [place], PGY-2  Rush Farmer, M.D. [facility] Pulmonary/Critical Care Medicine. Pager: [phone]. After hours pager: [phone].  04/21/2016 6:49 AM

## 2016-04-21 NOTE — Progress Notes (Signed)
Subjective: Interval History: on vent, moves all extrem, on pressors  Objective: Vital signs in last 24 hours: Temp:  [97.5 F (36.4 C)-100.3 F (37.9 C)] 97.5 F (36.4 C) (10/26 0405) Pulse Rate:  [32-148] 80 (10/26 0615) Resp:  [13-24] 18 (10/26 0615) BP: (78-179)/(43-141) 78/58 (10/26 0600) SpO2:  [94 %-100 %] 100 % (10/26 0615) FiO2 (%):  [40 %] 40 % (10/26 0314) Weight:  [128.6 kg (283 lb 8.2 oz)] 128.6 kg (283 lb 8.2 oz) (10/26 0500) Weight change: 1.91 kg (4 lb 3.4 oz)  Intake/Output from previous day: 10/25 0701 - 10/26 0700 In: 2260.8 [I.V.:1124.4; Blood:120; NG/GT:866.4; IV Piggyback:150] Out: 2167  Intake/Output this shift: Total I/O In: 1506.5 [I.V.:651.5; NG/GT:805; IV Piggyback:50] Out: 2167 [Other:2167]  General appearance: moderately obese, pale, uncooperative and on vent, moves all extrem,  Neck: see below Resp: Sims IJ iv,RIJ HD cath Cardio: irregularly irregular rhythm and systolic murmur: holosystolic 2/6, blowing at apex GI: obese, liver down 6 cm, pos bs Extremities: edema 3-4+, reddness extending into thighs, chronic changes in calves and avf LUA  Resp diffuse rhonchi  Lab Results:  Recent Labs  04/26/2016 0305 04/21/16 0340  WBC 18.9* 18.6*  HGB 8.4* 8.5*  HCT 24.9* 25.6*  PLT 28* 43*   BMET:  Recent Labs  04/05/2016 2015 04/21/16 0340  NA 133* 134*  K 5.7* 5.3*  CL 94* 96*  CO2 23 24  GLUCOSE 215* 254*  BUN 104* 85*  CREATININE 5.40* 4.27*  CALCIUM 8.1* 8.3*   No results for input(s): PTH in the last 72 hours. Iron Studies: No results for input(s): IRON, TIBC, TRANSFERRIN, FERRITIN in the last 72 hours.  Studies/Results: Dg Chest Port 1 View  Result Date: 04/05/2016 CLINICAL DATA:  Encounter for central line placement. EXAM: PORTABLE CHEST 1 VIEW COMPARISON:  Radiograph of April 20, 2016. FINDINGS: Stable cardiomegaly. Endotracheal and nasogastric tubes are in grossly good position. Left internal jugular catheter is unchanged with  distal tip in expected position of the SVC. Interval placement of right internal jugular catheter with distal tip in expected position of the SVC. No pneumothorax is noted. Mild bibasilar subsegmental atelectasis is noted. Bony thorax is unremarkable. IMPRESSION: Interval placement of right internal jugular catheter with distal tip in expected position of the SVC. Mild bibasilar subsegmental atelectasis is noted. Electronically Signed   By: Marijo Conception, M.D.   On: 03/30/2016 14:27   Dg Chest Port 1 View  Result Date: 04/22/2016 CLINICAL DATA:  Endotracheal intubation. EXAM: PORTABLE CHEST 1 VIEW COMPARISON:  Radiographs of April 19, 2016. FINDINGS: Stable cardiomegaly. Endotracheal and nasogastric tubes are unchanged in position. Left internal jugular catheter is unchanged with distal tip in expected position of the SVC. No pneumothorax is noted. Right lung is clear. Left lung base is not entirely included in field-of-view, but left basilar opacity is noted concerning for pneumonia or possibly atelectasis. Bony thorax is unremarkable. IMPRESSION: Stable support apparatus. Left lung base not completely included in field-of-view, but left basilar opacity is noted concerning for atelectasis or pneumonia. Electronically Signed   By: Marijo Conception, M.D.   On: 04/09/2016 07:39   Dg Chest Port 1 View  Result Date: 04/19/2016 CLINICAL DATA:  Status post intubation and IJ catheter placement today. EXAM: PORTABLE CHEST 1 VIEW COMPARISON:  Single-view of the chest 04/17/2016. FINDINGS: Right IJ dialysis catheter has been removed. New endotracheal tube is in place with tip in good position at the level of the clavicular heads. Left IJ approach  central venous catheter tip projects in the mid to lower superior vena cava. New NG tube is in place with the tip in the fundus of the stomach. There is no pneumothorax. Left basilar airspace opacity is increased since the prior examination. No pleural effusion. There is  cardiomegaly. IMPRESSION: Support apparatus projects in good position. Negative for pneumothorax. Increased airspace opacity in left mid and lower lung zones worrisome for pneumonia. Electronically Signed   By: Inge Rise M.D.   On: 04/19/2016 18:04    I have reviewed the patient's current medications.  Assessment/Plan: 1 ESRD solute slowly lower, will ^ clearance.  K slowly better.  CVP low but vol xs,slowly lower .  Acid base ok  2 Sepsis Pseudomonas, ? Staph also.  Source  Suspect legs with cellulitis. On AB 3 BP some better but high vol 4 DM controlled 5 coagulopathy ptlt some better, INR still out secondary to sepsis 6 Nutrition on Tf 7 Anemia esa/fe 8 HPTH vit D 9 obesity 10 Bipolar P CRRT, ^ DFR, AB , vent, pressors slowly wean,  TF,     LOS: 5 days   Johnny Sims 04/21/2016,6:59 AM

## 2016-04-21 NOTE — Progress Notes (Signed)
[facility] Progress Note Patient Name: Johnny Sims DOB: 12-05-1955 MRN: [id number]   Date of Service  04/21/2016  HPI/Events of Note  Agitated on fentanyl drip  eICU Interventions  Bolus with versed then up to 10 mg per hour      Intervention Category Major Interventions: Delirium, psychosis, severe agitation - evaluation and management  Christinia Gully 04/21/2016, 9:32 PM

## 2016-04-21 NOTE — Progress Notes (Signed)
eLink Physician-Brief Progress Note Patient Name: Johnny Sims DOB: February 15, 1956 MRN: [id number]   Date of Service  04/21/2016  HPI/Events of Note  Agitation.  eICU Interventions  Will increase Versed to 1 mg IV Q 1 hour PRN.     Intervention Category Minor Interventions: Agitation / anxiety - evaluation and management  Felicie Kocher Eugene 04/21/2016, 3:51 AM

## 2016-04-21 NOTE — Progress Notes (Signed)
ANTICOAGULATION CONSULT NOTE - Follow Up Consult  Pharmacy Consult for Coumadin  Indication: h/o Afib (Supratherapeutic INR on admit); New/acute CVA this admission  Allergies  Allergen Reactions  . Hydrocodone-Acetaminophen Palpitations and Other (See Comments)    Doesn't want to take  . Iodides Other (See Comments)    Patient states he doesn't know what iodine is and doesn't think he is allergic to it despite it being listed with his allergies  . Other Hives and Other (See Comments)    Steroid creams "Corndogs"- Nausea & vomiting   . Sulfacetamide Sodium Hives and Itching  . Tylenol [Acetaminophen] Nausea And Vomiting  . Gabapentin Swelling    SWELLING REACTION UNSPECIFIED   . Antihistamines, Chlorpheniramine-Type Other (See Comments)    unknown  . Hydralazine Rash  . Iodinated Diagnostic Agents Other (See Comments)    Patient states he doesn't know what iodine is and doesn't think he is allergic to it despite it being listed with his allergies  . Latex Rash  . Lidocaine Itching and Other (See Comments)    Patient is uncertain of this allergy  . Pentazocine Other (See Comments)    unknown  . Pheniramine Other (See Comments)    unknown  . Sulfa Antibiotics Itching and Other (See Comments)  . Tape Rash  . Vicodin [Hydrocodone-Acetaminophen] Palpitations and Other (See Comments)    Doesn't want to take    Patient Measurements: Height: 6\' 2"  (188 cm) Weight: 283 lb 8.2 oz (128.6 kg) IBW/kg (Calculated) : 82.2 Heparin Dosing Weight: 110 kg  Vital Signs: Temp: 97.7 F (36.5 C) (10/26 1159) Temp Source: Oral (10/26 1159) BP: 99/55 (10/26 1300) Pulse Rate: 100 (10/26 1300)  Labs:  Recent Labs  04/19/16 1038 04/19/16 1655  04/19/16 2018 04/06/2016 0305 04/06/2016 2015 04/21/16 0340  HGB  --  8.3*  --   --  8.4*  --  8.5*  HCT  --  25.3*  --   --  24.9*  --  25.6*  PLT 20* 25*  --   --  28*  --  43*  APTT 51*  --   --   --   --   --  42*  LABPROT 30.4*  --   --   --   20.2*  --  24.5*  INR 2.84  --   --   --  1.70  --  2.17  CREATININE  --  5.65*  < >  --  5.65* 5.40* 4.27*  TROPONINI  --  0.71*  --  1.54* 0.28*  --   --   < > = values in this interval not displayed.  Estimated Creatinine Clearance: 26.2 mL/min (by C-G formula based on SCr of 4.27 mg/dL (H)).   Medications:  Scheduled:  . sodium chloride   Intravenous Once  . calcitRIOL  0.5 mcg Oral Q T,Th,Sa-HD  . calcium acetate  2,001 mg Oral TID WC  . chlorhexidine gluconate (MEDLINE KIT)  15 mL Mouth Rinse BID  . darbepoetin (ARANESP) injection - DIALYSIS  200 mcg Intravenous Q Thu-HD  . feeding supplement (PRO-STAT SUGAR FREE 64)  30 mL Per Tube TID  . feeding supplement (VITAL HIGH PROTEIN)  1,000 mL Per Tube Q24H  . ferric gluconate (FERRLECIT/NULECIT) IV  125 mg Intravenous Q T,Th,Sa-HD  . heparin  40 Units/kg Dialysis Once in dialysis  . insulin aspart  0-15 Units Subcutaneous Q4H  . insulin glargine  15 Units Subcutaneous Daily  . levothyroxine  88 mcg Oral QAC  breakfast  . mouth rinse  15 mL Mouth Rinse QID  . multivitamin  1 tablet Oral QHS  . pantoprazole sodium  40 mg Per Tube Daily  . piperacillin-tazobactam  3.375 g Intravenous Q6H  . sodium chloride  250 mL Intravenous Once  . sodium chloride flush  3 mL Intravenous Q12H  . tiotropium  18 mcg Inhalation Daily  . vancomycin  1,250 mg Intravenous Q24H    Assessment: 59 y.o. male admitted on 04/07/2016 with Acute CVA per MRI brain 99/24, possibly embolic 2/2 afib (Coumadin PTA). INR elevated on admit, began trending down to 1.70 s/p vitamin k, now back up to 2.17 despite continuing to hold Coumadin. Plts remain low at 43 s/p platelet transfusion, hemoglobin stable at 8.5. Per CCM and cards, hold off all anticoagulation for now.  **PTA Coumadin Dose: '10mg'$  daily except 7.'5mg'$  Sun/Thurs  Goal of Therapy:  INR 2-3 Monitor platelets by anticoagulation protocol: Yes   Plan:  -Continue to hold Coumadin for now -Monitor daily  INR, plts closely, s/s of bleed -Pharmacy will sign off for now, please re-consult once it is determined further anticoagulation is appropriate  Arrie Senate, PharmD PGY-1 Pharmacy Resident Pager: [phone] 04/21/2016

## 2016-04-21 NOTE — Progress Notes (Signed)
Spoke with patient's brother Leory Plowman to provide update. He understands that the patient has multiple medical issues and is on antibiotics to treat infection, is getting dialysis, remains intubated, and is on medications for agitation. Brother hopes to come visit tomorrow and/or Sunday but is limited by his own medical conditions.  Olene Floss, MD [place], PGY-2

## 2016-04-21 NOTE — Progress Notes (Signed)
PULMONARY / CRITICAL CARE MEDICINE   Name: Johnny Sims MRN: [id number] DOB: 13-Jul-1955    ADMISSION DATE:  03/30/2016 CONSULTATION DATE:  04/19/2016  REFERRING MD:  Dr. Thayer Headings  CHIEF COMPLAINT:  AMS now hypotension  HISTORY OF PRESENT ILLNESS:   60 year old male with PMH as below, which is significant for ESRD on HD TTS, DM, COPD, PAF on warfarin, and dCHF. He was recently 10/2 admitted for hypervolemia in the setting of progression of his CKD at which point HD was initiated through newly placed tunneled catheter. Once euvolemic (4 HD treatments) he was discharged 10/7. 10/19 he presented to ED post fall and was found to have INR elevation, but with the exception with some bruising he was otherwise stable and was discharged. 10/21 he again presented to [facility] ED, this time with altered mental status and slurred speech.  There was concern for CVA as he presented with AF-RVR. But INR found to be elevated at 8.  MRI demonstrated tiny R frontal lobe infarct. Not clear whether this is entirely the cause of his altered mental status. There was also concern for sepsis in setting of leukopenia and CXR infiltrate so he was started on cefepime and vancomycin. Initial blood cultures started to grow GNR and tunneled HD cath was pulled 10/22. Turned out to be pseudomonas putida pan-sensitive. Repeat cultures on 10/23 with MRSA. ABX remain Vancomycin and Zosyn. TTE did not describe vegetation. 10/24 he became hypotensive and airway protection worsened. PCCM asked to see.   SUBJECTIVE:  Remains on levo 8. On amio drip for a fib at 30. Required increased fentanyl (now on 300) and versed overnight for agitation. 2.4 L taken off with CRRT yesterday. Currently on CRRT at -50 ml/hr.   VITAL SIGNS: BP (!) 92/59   Pulse 72   Temp 97.3 F (36.3 C) (Oral)   Resp 18   Ht 6\' 2"  (1.88 m)   Wt 128.6 kg (283 lb 8.2 oz)   SpO2 100%   BMI 36.40 kg/m   HEMODYNAMICS: CVP:  [3 mmHg-4 mmHg] 4 mmHg  VENTILATOR  SETTINGS: Vent Mode: PRVC FiO2 (%):  [40 %] 40 % Set Rate:  [18 bmp] 18 bmp Vt Set:  [660 mL] 660 mL PEEP:  [5 cmH20] 5 cmH20 Plateau Pressure:  [11 cmH20-18 cmH20] 17 cmH20  INTAKE / OUTPUT: I/O last 3 completed shifts: In: 3211 [I.V.:1909.6; Blood:120; NG/GT:931.4; IV Piggyback:250] Out: 2427 [Other:2427]  PHYSICAL EXAMINATION: General:  Chronically ill appearing male, ETT in place Neuro:  Opens eyes, following some commands (tracks) HEENT:  [place]/AT, PERRL Cardiovascular: Irregularly irregular, 3/6 systolic murmur. 3+ LE to past knees Lungs:  Coarse breath sounds and referred upper airway sounds Abdomen:  Soft non-tender Musculoskeletal: No acute deformity Skin:  Grossly intact. PVD changes to LE. Oozing central line of left neck.   LABS:  BMET  Recent Labs Lab 04/05/2016 0305 04/04/2016 2015 04/21/16 0340  NA 132* 133* 134*  K 5.4* 5.7* 5.3*  CL 93* 94* 96*  CO2 24 23 24   BUN 102* 104* 85*  CREATININE 5.65* 5.40* 4.27*  GLUCOSE 213* 215* 254*   Electrolytes  Recent Labs Lab 04/19/16 1655  03/30/2016 0305 04/22/2016 2015 04/21/16 0340  CALCIUM 8.0*  < > 8.1* 8.1* 8.3*  MG 2.1  --  2.2  --  2.3  PHOS 6.5*  < > 6.4* 6.3* 5.0*  < > = values in this interval not displayed.  CBC  Recent Labs Lab 04/19/16 1655 04/04/2016  0305 04/21/16 0340  WBC 18.8* 18.9* 18.6*  HGB 8.3* 8.4* 8.5*  HCT 25.3* 24.9* 25.6*  PLT 25* 28* 43*    Coag's  Recent Labs Lab 04/19/16 1038 04/13/2016 0305 04/21/16 0340  APTT 51*  --  42*  INR 2.84 1.70 2.17    Sepsis Markers  Recent Labs Lab 04/15/2016 1243 04/19/16 1655 04/19/16 1933  LATICACIDVEN 2.4* 1.5 1.3    ABG  Recent Labs Lab 04/19/16 1651 04/09/2016 0347 04/21/16 0355  PHART 7.253* 7.342* 7.298*  PCO2ART 55.5* 43.8 50.9*  PO2ART 113.0* 122* 147*    Liver Enzymes  Recent Labs Lab 04/09/2016 0433  04/19/16 1038  04/11/2016 0305 04/24/2016 2015 04/21/16 0340  AST 42*  --  47*  --   --   --   --   ALT 29  --   47  --   --   --   --   ALKPHOS 63  --  101  --   --   --   --   BILITOT 1.1  --  1.6*  --   --   --   --   ALBUMIN 2.8*  < > 1.8*  < > 1.6* 1.6* 1.6*  < > = values in this interval not displayed.  Cardiac Enzymes  Recent Labs Lab 04/19/16 1655 04/19/16 2018 04/02/2016 0305  TROPONINI 0.71* 1.54* 0.28*    Glucose  Recent Labs Lab 04/19/2016 1211 04/04/2016 1535 04/11/2016 2005 04/06/2016 2326 04/21/16 0403 04/21/16 0756  GLUCAP 190* 198* 222* 228* 239* 251*    Imaging Dg Chest Port 1 View  Result Date: 04/21/2016 CLINICAL DATA:  Endotracheal tube EXAM: PORTABLE CHEST 1 VIEW COMPARISON:  Yesterday FINDINGS: Endotracheal tube tip is at the clavicular heads. Bilateral IJ central line with tips at the upper SVC. Orogastric tube reaches the diaphragm. Cardiopericardial enlargement. Improved right lung aeration. Hazy opacity at the left base with subtle volume loss. No pneumothorax. IMPRESSION: 1. Improving vascular congestion. Lung opacity is now asymmetric to the left, possible pneumonia. 2. Stable tube and line positioning. Electronically Signed   By: Monte Fantasia M.D.   On: 04/21/2016 07:23   Dg Chest Port 1 View  Result Date: 04/02/2016 CLINICAL DATA:  Encounter for central line placement. EXAM: PORTABLE CHEST 1 VIEW COMPARISON:  Radiograph of April 20, 2016. FINDINGS: Stable cardiomegaly. Endotracheal and nasogastric tubes are in grossly good position. Left internal jugular catheter is unchanged with distal tip in expected position of the SVC. Interval placement of right internal jugular catheter with distal tip in expected position of the SVC. No pneumothorax is noted. Mild bibasilar subsegmental atelectasis is noted. Bony thorax is unremarkable. IMPRESSION: Interval placement of right internal jugular catheter with distal tip in expected position of the SVC. Mild bibasilar subsegmental atelectasis is noted. Electronically Signed   By: Marijo Conception, M.D.   On: 04/07/2016 14:27      STUDIES:  Ct head 10/21 > RIGHT facial contusion. No acute intracranial process. Mild chronic small vessel ischemic Disease. 19 mm LEFT thyroid nodule for which follow up thyroid sonogram is recommended on a nonemergent basis. ECHO 10/23 > LVEF 50-55%; mild LVH, R atrium mildly dilated, mild aortic regurg, mod-severe mitral regurg, PA pressure 45  CULTURES: BCx2 10/21 > Pseudomonas putida BCx2 10/23 > Methicillin resistant Staph Catheter tip 10/22 > no growth  ANTIBIOTICS: Zosyn 10/21 >>> Vancomycin 10/21 >>>  SIGNIFICANT EVENTS:   LINES/TUBES: ETT 10/24 > L IJ CVC 10/24 >  PIVs x2 HD  cath RIJ 10/25 >  DISCUSSION: 60 year old male with newly converted ESRD. Admitted 10/21 with AMS and found to have small CVA and now bacteremia likely from tunneled HD cath. On broad spectrum antibiotics, but clinical status deteriorated. 10/24 transferred to ICU for intubation, pressors. Now with suspected aspiration PNA. HD cath replaced 10/25 and CRRT initiated.   ASSESSMENT / PLAN:  PULMONARY A: Acute hypoxemic respiratory failure secondary to aspiration PNA COPD without acute exacerbation Respiratory acidosis P:   Full ventilator support Increase RR to 22. Daily CXRs while intubated ABGs daily  VAP bundle  CARDIOVASCULAR A:  Shock - likely septic vs cardiogenic with severe MR PAF on warfarin and amiodarone gtt H/o hypotension, HLD  P:  Telemetry monitoring MAP goal > 44mmHg KVO IVF as he appears volume overloaded Levophed infusion for MAP goal 8 mcg TEE pending Holding home antihypertensives - start back metoprolol when off pressor for afib Continue amiodarone gtt  RENAL A:   ESRD on HD TTS + 3.1 L since admission  P:   Nephrology following, appreciate recommendations CRRT BMET in AM Replace electrolytes as indicated.  GASTROINTESTINAL A:   Hepatitis C GERD  P:   NPO Intermittent LFTs  TFs Protonix daily for SUP  HEMATOLOGIC A:    Thrombocytopenia, severe --> stable Warfarin coagulopathy Anemia  P:  Holding coumadin and anticoagulation altogether. Trend CBC and coags Transfuse for Hgb < 7  INFECTIOUS A:   Bacteremia MRSA and Pseudomonas Putida - source possible LE cellulitis Likely aspiration PNA Need to rule out infective endocarditis  P:   Continue Zosyn and Vancomycin  Continue to follow cultures Obtain tracheal aspirate cx? TTE when more stable  ENDOCRINE A:   DM - Last A1c 6.9 on 04/17/16 Hypothyroid with low TSH CBGs in 200s  P:   CBG monitoring and SSI Consider insulin gtt Lantus 15 daily  NEUROLOGIC A:   Acute metabolic encephalopathy CVA Bipolar disorder P:   RASS goal: 0 to -1 Fentanyl/versed Stroke team following  FAMILY  - Updates: No family bedside  - Inter-disciplinary family meet or Palliative Care meeting due by: 10/31  The patient is critically ill with multiple organ systems failure and requires high complexity decision making for assessment and support, frequent evaluation and titration of therapies, application of advanced monitoring technologies and extensive interpretation of multiple databases.   Critical Care Time devoted to patient care services described in this note is  35  Minutes. This time reflects time of care of this signee Dr [name]. This critical care time does not reflect procedure time, or teaching time or supervisory time of PA/NP/Med student/Med Resident etc but could involve care discussion time.  Rush Farmer, M.D. [facility] Pulmonary/Critical Care Medicine. Pager: [phone]. After hours pager: [phone].   04/21/2016 10:08 AM

## 2016-04-22 ENCOUNTER — Inpatient Hospital Stay (HOSPITAL_COMMUNITY): Payer: Medicaid Other

## 2016-04-22 LAB — RENAL FUNCTION PANEL
ANION GAP: 8 (ref 5–15)
Albumin: 1.4 g/dL — ABNORMAL LOW (ref 3.5–5.0)
Albumin: 1.5 g/dL — ABNORMAL LOW (ref 3.5–5.0)
Anion gap: 8 (ref 5–15)
BUN: 52 mg/dL — AB (ref 6–20)
BUN: 43 mg/dL — ABNORMAL HIGH (ref 6–20)
CALCIUM: 8.5 mg/dL — AB (ref 8.9–10.3)
CHLORIDE: 102 mmol/L (ref 101–111)
CO2: 25 mmol/L (ref 22–32)
CO2: 26 mmol/L (ref 22–32)
CREATININE: 1.88 mg/dL — AB (ref 0.61–1.24)
Calcium: 8.3 mg/dL — ABNORMAL LOW (ref 8.9–10.3)
Chloride: 103 mmol/L (ref 101–111)
Creatinine, Ser: 2.24 mg/dL — ABNORMAL HIGH (ref 0.61–1.24)
GFR calc Af Amer: 35 mL/min — ABNORMAL LOW (ref >60)
GFR, EST AFRICAN AMERICAN: 43 mL/min — AB (ref >60)
GFR, EST NON AFRICAN AMERICAN: 30 mL/min — AB (ref >60)
GFR, EST NON AFRICAN AMERICAN: 37 mL/min — AB (ref >60)
Glucose, Bld: 241 mg/dL — ABNORMAL HIGH (ref 65–99)
Glucose, Bld: 216 mg/dL — ABNORMAL HIGH (ref 65–99)
POTASSIUM: 4.9 mmol/L (ref 3.5–5.1)
Phosphorus: 2.6 mg/dL (ref 2.5–4.6)
Phosphorus: 2.7 mg/dL (ref 2.5–4.6)
Potassium: 4.8 mmol/L (ref 3.5–5.1)
SODIUM: 137 mmol/L (ref 135–145)
Sodium: 135 mmol/L (ref 135–145)

## 2016-04-22 LAB — GLUCOSE, CAPILLARY
GLUCOSE-CAPILLARY: 239 mg/dL — AB (ref 65–99)
GLUCOSE-CAPILLARY: 220 mg/dL — AB (ref 65–99)
GLUCOSE-CAPILLARY: 251 mg/dL — AB (ref 65–99)
Glucose-Capillary: 160 mg/dL — ABNORMAL HIGH (ref 65–99)
Glucose-Capillary: 196 mg/dL — ABNORMAL HIGH (ref 65–99)
Glucose-Capillary: 283 mg/dL — ABNORMAL HIGH (ref 65–99)

## 2016-04-22 LAB — CBC
HEMATOCRIT: 23.6 % — AB (ref 39.0–52.0)
Hemoglobin: 7.8 g/dL — ABNORMAL LOW (ref 13.0–17.0)
MCH: 29.9 pg (ref 26.0–34.0)
MCHC: 33.1 g/dL (ref 30.0–36.0)
MCV: 90.4 fL (ref 78.0–100.0)
Platelets: 30 10*3/uL — ABNORMAL LOW (ref 150–400)
RBC: 2.61 MIL/uL — AB (ref 4.22–5.81)
RDW: 19.5 % — AB (ref 11.5–15.5)
WBC: 13.3 10*3/uL — AB (ref 4.0–10.5)

## 2016-04-22 LAB — POCT I-STAT 3, ART BLOOD GAS (G3+)
Acid-Base Excess: 2 mmol/L (ref 0.0–2.0)
Bicarbonate: 27.2 mmol/L (ref 20.0–28.0)
O2 Saturation: 99 %
PCO2 ART: 44.3 mmHg (ref 32.0–48.0)
PH ART: 7.394 (ref 7.350–7.450)
PO2 ART: 131 mmHg — AB (ref 83.0–108.0)
Patient temperature: 98.1
TCO2: 29 mmol/L (ref 0–100)

## 2016-04-22 LAB — PROTIME-INR
INR: 2.33
Prothrombin Time: 26 seconds — ABNORMAL HIGH (ref 11.4–15.2)

## 2016-04-22 LAB — MAGNESIUM: Magnesium: 2.4 mg/dL (ref 1.7–2.4)

## 2016-04-22 MED ORDER — POLYETHYLENE GLYCOL 3350 17 G PO PACK
17.0000 g | PACK | Freq: Every day | ORAL | Status: DC
  Administered 2016-04-22 – 2016-04-24 (×3): 17 g via ORAL
  Filled 2016-04-22 (×4): qty 1

## 2016-04-22 MED ORDER — DIGOXIN 0.25 MG/ML IJ SOLN
0.1250 mg | Freq: Once | INTRAMUSCULAR | Status: AC
  Administered 2016-04-22: 0.125 mg via INTRAVENOUS
  Filled 2016-04-22: qty 2

## 2016-04-22 MED ORDER — INSULIN GLARGINE 100 UNIT/ML ~~LOC~~ SOLN
25.0000 [IU] | Freq: Every day | SUBCUTANEOUS | Status: DC
  Administered 2016-04-23 – 2016-04-26 (×4): 25 [IU] via SUBCUTANEOUS
  Filled 2016-04-22 (×4): qty 0.25

## 2016-04-22 MED ORDER — INSULIN GLARGINE 100 UNIT/ML ~~LOC~~ SOLN
10.0000 [IU] | Freq: Every day | SUBCUTANEOUS | Status: AC
  Administered 2016-04-22: 10 [IU] via SUBCUTANEOUS
  Filled 2016-04-22: qty 0.1

## 2016-04-22 NOTE — Progress Notes (Signed)
Patient Name: Johnny Sims Date of Encounter: 04/22/2016 Primary Cardiologist: Grayland Jack, MD  Hospital Problem List     Active Problems:   Hyperlipidemia   Essential hypertension   GERD (gastroesophageal reflux disease)   Atrial fibrillation status post cardioversion [facility])   Type 2 diabetes mellitus with diabetic polyneuropathy, with long-term current use of insulin (HCC)   ESRD on dialysis ([place])   Weakness   Slurred speech   Leukopenia   Fall   Pressure injury of skin   CVA (cerebral vascular accident) ([place])   Contusion of face   Gram-negative bacteremia   Acute respiratory failure with hypoxia ([place])   Septic shock ([place])   Endotracheally intubated   Acute encephalopathy     Subjective   Intubated and sedated  Inpatient Medications    Scheduled Meds: . sodium chloride   Intravenous Once  . calcitRIOL  0.5 mcg Oral Q T,Th,Sa-HD  . calcium acetate  2,001 mg Oral TID WC  . chlorhexidine gluconate (MEDLINE KIT)  15 mL Mouth Rinse BID  . darbepoetin (ARANESP) injection - DIALYSIS  200 mcg Intravenous Q Thu-HD  . feeding supplement (PRO-STAT SUGAR FREE 64)  30 mL Per Tube TID  . feeding supplement (VITAL HIGH PROTEIN)  1,000 mL Per Tube Q24H  . ferric gluconate (FERRLECIT/NULECIT) IV  125 mg Intravenous Q T,Th,Sa-HD  . heparin  40 Units/kg Dialysis Once in dialysis  . insulin aspart  0-15 Units Subcutaneous Q4H  . [START ON 04/23/2016] insulin glargine  25 Units Subcutaneous Daily  . levothyroxine  88 mcg Oral QAC breakfast  . mouth rinse  15 mL Mouth Rinse QID  . multivitamin  1 tablet Oral QHS  . pantoprazole sodium  40 mg Per Tube Daily  . piperacillin-tazobactam  3.375 g Intravenous Q6H  . polyethylene glycol  17 g Oral Daily  . sodium chloride  250 mL Intravenous Once  . sodium chloride flush  3 mL Intravenous Q12H  . tiotropium  18 mcg Inhalation Daily  . vancomycin  1,250 mg Intravenous Q24H   Continuous Infusions: . amiodarone 30 mg/hr (04/22/16  0600)  . fentaNYL infusion INTRAVENOUS 400 mcg/hr (04/22/16 1100)  . midazolam (VERSED) infusion 4 mg/hr (04/22/16 1100)  . norepinephrine (LEVOPHED) Adult infusion 6 mcg/min (04/22/16 1100)  . dialysis replacement fluid (prismasate) 800 mL/hr at 04/22/16 0859  . dialysis replacement fluid (prismasate) 700 mL/hr at 04/22/16 0330  . dialysate (PRISMASATE) 2,000 mL/hr at 04/22/16 1134   PRN Meds: sodium chloride, sodium chloride, alteplase, fentaNYL, heparin, heparin, ipratropium-albuterol, lidocaine (PF), lidocaine-prilocaine, midazolam, oxyCODONE, pentafluoroprop-tetrafluoroeth, sodium chloride   Vital Signs    Vitals:   04/22/16 1115 04/22/16 1119 04/22/16 1130 04/22/16 1138  BP: 114/72  (!) 92/52 125/81  Pulse: 76  95 72  Resp: 20  (!) 22 20  Temp:  97.2 F (36.2 C)    TempSrc:  Axillary    SpO2: 100%  100% 100%  Weight:      Height:        Intake/Output Summary (Last 24 hours) at 04/22/16 1140 Last data filed at 04/22/16 1100  Gross per 24 hour  Intake          3528.33 ml  Output             4774 ml  Net         -1245.67 ml   Filed Weights   04/08/2016 0354 04/21/16 0500 04/22/16 0500  Weight: 279 lb 4.8 oz (126.7 kg) 283 lb 8.2  oz (128.6 kg) 281 lb 8.4 oz (127.7 kg)    Physical Exam  Rapid irregular rhythm. No gallop is heard.  Labs    CBC  Recent Labs  04/21/16 0340 04/22/16 0422  WBC 18.6* 13.3*  HGB 8.5* 7.8*  HCT 25.6* 23.6*  MCV 87.1 90.4  PLT 43* 30*   Basic Metabolic Panel  Recent Labs  04/21/16 0340 04/21/16 1530 04/22/16 0420 04/22/16 0422  NA 134* 136 135  --   K 5.3* 4.9 4.9  --   CL 96* 100* 102  --   CO2 '24 26 25  '$ --   GLUCOSE 254* 215* 241*  --   BUN 85* 62* 52*  --   CREATININE 4.27* 2.99* 2.24*  --   CALCIUM 8.3* 8.4* 8.3*  --   MG 2.3  --   --  2.4  PHOS 5.0* 3.6 2.6  --    Liver Function Tests  Recent Labs  04/21/16 1530 04/22/16 0420  ALBUMIN 1.6* 1.4*   No results for input(s): LIPASE, AMYLASE in the last 72  hours. Cardiac Enzymes  Recent Labs  04/19/16 1655 04/19/16 2018 04/18/2016 0305  TROPONINI 0.71* 1.54* 0.28*   BNP Invalid input(s): POCBNP D-Dimer No results for input(s): DDIMER in the last 72 hours. Hemoglobin A1C No results for input(s): HGBA1C in the last 72 hours. Fasting Lipid Panel No results for input(s): CHOL, HDL, LDLCALC, TRIG, CHOLHDL, LDLDIRECT in the last 72 hours. Thyroid Function Tests No results for input(s): TSH, T4TOTAL, T3FREE, THYROIDAB in the last 72 hours.  Invalid input(s): FREET3  Telemetry    Atrial fibrillation with intermittent rapid ventricular response. On average approximately 110 bpm. - Personally Reviewed  ECG    No new data - Personally Reviewed  Radiology    Dg Chest Port 1 View  Result Date: 04/22/2016 CLINICAL DATA:  Intubation. EXAM: PORTABLE CHEST 1 VIEW COMPARISON:  04/21/2016 and 04/23/2016 FINDINGS: Endotracheal tube has tip 7.7 cm above the carina. Nasogastric tube is coiled once over the stomach with tip and side-port over the stomach in the left upper quadrant. Right IJ central venous catheter and left IJ central venous catheters are unchanged. Lungs are adequately inflated with minimal improvement, but mild residual prominence of the left perihilar vasculature. Better aeration in the left base with possible small amount left pleural fluid/ atelectasis. Mild stable cardiomegaly. Remainder of the exam is unchanged. IMPRESSION: Interval improvement with mild residual prominence of the left perihilar vasculature likely mild asymmetric congestion. Better aeration in the left base with suggestion of a small effusions/atelectasis. Electronically Signed   By: Marin Olp M.D.   On: 04/22/2016 07:08   Dg Chest Port 1 View  Result Date: 04/21/2016 CLINICAL DATA:  Endotracheal tube EXAM: PORTABLE CHEST 1 VIEW COMPARISON:  Yesterday FINDINGS: Endotracheal tube tip is at the clavicular heads. Bilateral IJ central line with tips at the upper  SVC. Orogastric tube reaches the diaphragm. Cardiopericardial enlargement. Improved right lung aeration. Hazy opacity at the left base with subtle volume loss. No pneumothorax. IMPRESSION: 1. Improving vascular congestion. Lung opacity is now asymmetric to the left, possible pneumonia. 2. Stable tube and line positioning. Electronically Signed   By: Monte Fantasia M.D.   On: 04/21/2016 07:23   Dg Chest Port 1 View  Result Date: 04/24/2016 CLINICAL DATA:  Encounter for central line placement. EXAM: PORTABLE CHEST 1 VIEW COMPARISON:  Radiograph of April 20, 2016. FINDINGS: Stable cardiomegaly. Endotracheal and nasogastric tubes are in grossly good position. Left internal  jugular catheter is unchanged with distal tip in expected position of the SVC. Interval placement of right internal jugular catheter with distal tip in expected position of the SVC. No pneumothorax is noted. Mild bibasilar subsegmental atelectasis is noted. Bony thorax is unremarkable. IMPRESSION: Interval placement of right internal jugular catheter with distal tip in expected position of the SVC. Mild bibasilar subsegmental atelectasis is noted. Electronically Signed   By: Marijo Conception, M.D.   On: 04/02/2016 14:27    Cardiac Studies   No new data   Patient Profile     Mr. Valladolid is a 60 year old male with a past medical history of chronic atrial fibrillation (On Warfarin), ESRD on HD (recently started HD earlier this month), DM, Hepatitis C, HTN, and COPD. He presented to the ED on 04/01/2016 after a fall at home, found to have small right frontal lobe infarct and INR of 8 with thrombocytopenia.Has chronicafib And noted to have rapid ventricular response on admission. Aspiration pneumonia, and bacteremia he was intubated on 04/19/16. Overall prognosis is poor.  Assessment & Plan    1. Chronic atrial fibrillation with rapid ventricular response secondary to superimposed illness. Continue IV amiodarone. Blood pressure is  tenuous and therefore beta blockers and diltiazem cannot be added. He is on Levophed. Digoxin 0.125 mg will be given IV today.Demetrios Isaacs, MD  04/22/2016, 11:40 AM

## 2016-04-22 NOTE — Progress Notes (Signed)
Subjective: Interval History: bp varies depending on sedation.  On vent on pressors  Objective: Vital signs in last 24 hours: Temp:  [97.3 F (36.3 C)-98.3 F (36.8 C)] 98.3 F (36.8 C) (10/27 0400) Pulse Rate:  [32-124] 83 (10/27 0615) Resp:  [15-24] 22 (10/27 0615) BP: (73-166)/(41-127) 82/54 (10/27 0615) SpO2:  [99 %-100 %] 100 % (10/27 0615) FiO2 (%):  [40 %] 40 % (10/27 0315) Weight:  [127.7 kg (281 lb 8.4 oz)] 127.7 kg (281 lb 8.4 oz) (10/27 0500) Weight change: -0.9 kg (-1 lb 15.7 oz)  Intake/Output from previous day: 10/26 0701 - 10/27 0700 In: 3447.6 [I.V.:1442.6; NG/GT:1495; IV Piggyback:510] Out: 4464  Intake/Output this shift: Total I/O In: 1476.6 [I.V.:711.6; NG/GT:715; IV Piggyback:50] Out: 1901 [Other:1901]  General appearance: moderately obese and on vent , arouses but not coop, moves all extrem Neck: L IJ Iv,RIJ HD  cath Resp: difuse rhonchi, good bs Cardio: irregularly irregular rhythm and systolic murmur: holosystolic 2/6, blowing at apex GI: pos bs, liver down 4 cm soft, edema 3-4+remities: edema 3-4+ and erythema on legs up into thighs, stasis changes in calves  Lab Results:  Recent Labs  04/21/16 0340 04/22/16 0422  WBC 18.6* 13.3*  HGB 8.5* 7.8*  HCT 25.6* 23.6*  PLT 43* 30*   BMET:  Recent Labs  04/21/16 1530 04/22/16 0420  NA 136 135  K 4.9 4.9  CL 100* 102  CO2 26 25  GLUCOSE 215* 241*  BUN 62* 52*  CREATININE 2.99* 2.24*  CALCIUM 8.4* 8.3*   No results for input(s): PTH in the last 72 hours. Iron Studies: No results for input(s): IRON, TIBC, TRANSFERRIN, FERRITIN in the last 72 hours.  Studies/Results: Dg Chest Port 1 View  Result Date: 04/21/2016 CLINICAL DATA:  Endotracheal tube EXAM: PORTABLE CHEST 1 VIEW COMPARISON:  Yesterday FINDINGS: Endotracheal tube tip is at the clavicular heads. Bilateral IJ central line with tips at the upper SVC. Orogastric tube reaches the diaphragm. Cardiopericardial enlargement. Improved right  lung aeration. Hazy opacity at the left base with subtle volume loss. No pneumothorax. IMPRESSION: 1. Improving vascular congestion. Lung opacity is now asymmetric to the left, possible pneumonia. 2. Stable tube and line positioning. Electronically Signed   By: Monte Fantasia M.D.   On: 04/21/2016 07:23   Dg Chest Port 1 View  Result Date: 04/19/2016 CLINICAL DATA:  Encounter for central line placement. EXAM: PORTABLE CHEST 1 VIEW COMPARISON:  Radiograph of April 20, 2016. FINDINGS: Stable cardiomegaly. Endotracheal and nasogastric tubes are in grossly good position. Left internal jugular catheter is unchanged with distal tip in expected position of the SVC. Interval placement of right internal jugular catheter with distal tip in expected position of the SVC. No pneumothorax is noted. Mild bibasilar subsegmental atelectasis is noted. Bony thorax is unremarkable. IMPRESSION: Interval placement of right internal jugular catheter with distal tip in expected position of the SVC. Mild bibasilar subsegmental atelectasis is noted. Electronically Signed   By: Marijo Conception, M.D.   On: 04/19/2016 14:27    I have reviewed the patient's current medications.  Assessment/Plan: 1 ESRD CRRT going well with good solute, acid/base/K.  Severe vol xs ,slow removal with lower bps and CVP marginal 2 Low bps correlate best with sedation,needs less 3 VDRF per CCM 4 Sepsis, Pseudo, Staph on AB 5 Anemia worsened , esa, Fe 6 HPTH vit D per tube 7 Nutrition TF 8 Coagulopathy still low ptlt and ^^INR, ? Some role of liver dz 9 Bipolar P CRRT, ^  net neg, AB, Vent , TF    LOS: 6 days   Kymber Kosar L 04/22/2016,6:54 AM

## 2016-04-22 NOTE — Care Management Note (Signed)
Case Management Note  Patient Details  Name: Johnny Sims MRN: [id number] Date of Birth: 04/20/1956  Subjective/Objective:     Admitted 10/21 with AMS and found to have small CVA and now bacteremia likely from tunneled HD cath. On CRRT and ventilated  Action/Plan :  PTA from home.  CM will continue to follow for discharge needs   Expected Discharge Date:                  Expected Discharge Plan:     In-House Referral:     Discharge planning Services  CM Consult  Post Acute Care Choice:    Choice offered to:     DME Arranged:    DME Agency:     HH Arranged:    HH Agency:     Status of Service:  In process, will continue to follow  If discussed at Long Length of Stay Meetings, dates discussed:    Additional Comments:  Maryclare Labrador, RN 04/22/2016, 3:06 PM

## 2016-04-22 NOTE — Progress Notes (Signed)
SLP Cancellation Note  Patient Details Name: CASTULO IMEL MRN: [id number] DOB: 08/28/1955   Cancelled treatment:       Reason Eval/Treat Not Completed: Patient not medically ready;Medical issues which prohibited therapy. Pt remains intubated. Will sign off at this time.    Zulma Court, Katherene Ponto 04/22/2016, 9:25 AM

## 2016-04-22 NOTE — Progress Notes (Signed)
PULMONARY / CRITICAL CARE MEDICINE   Name: Johnny Sims MRN: [id number] DOB: 22-Apr-1956    ADMISSION DATE:  04/15/2016 CONSULTATION DATE:  04/19/2016  REFERRING MD:  Dr. Thayer Headings  CHIEF COMPLAINT:  AMS now hypotension  HISTORY OF PRESENT ILLNESS:   60 year old male with PMH as below, which is significant for ESRD on HD TTS, DM, COPD, PAF on warfarin, and dCHF. He was recently 10/2 admitted for hypervolemia in the setting of progression of his CKD at which point HD was initiated through newly placed tunneled catheter. Once euvolemic (4 HD treatments) he was discharged 10/7. 10/19 he presented to ED post fall and was found to have INR elevation, but with the exception with some bruising he was otherwise stable and was discharged. 10/21 he again presented to [facility] ED, this time with altered mental status and slurred speech.  There was concern for CVA as he presented with AF-RVR. But INR found to be elevated at 8.  MRI demonstrated tiny R frontal lobe infarct. Not clear whether this is entirely the cause of his altered mental status. There was also concern for sepsis in setting of leukopenia and CXR infiltrate so he was started on cefepime and vancomycin. Initial blood cultures started to grow GNR and tunneled HD cath was pulled 10/22. Turned out to be pseudomonas putida pan-sensitive. Repeat cultures on 10/23 with MRSA. ABX remain Vancomycin and Zosyn. TTE did not describe vegetation. 10/24 he became hypotensive and airway protection worsened. PCCM asked to see.   SUBJECTIVE:  Remains on levo 3. CRRT -100 ml/hr.  Bleeding orally overnight.  VITAL SIGNS: BP 133/69   Pulse 94   Temp 98.2 F (36.8 C) (Oral)   Resp (!) 22   Ht 6\' 2"  (1.88 m)   Wt 127.7 kg (281 lb 8.4 oz)   SpO2 100%   BMI 36.15 kg/m   HEMODYNAMICS: CVP:  [5 mmHg] 5 mmHg  VENTILATOR SETTINGS: Vent Mode: PRVC FiO2 (%):  [40 %] 40 % Set Rate:  [22 bmp] 22 bmp Vt Set:  [670 mL] 670 mL PEEP:  [5 cmH20] 5  cmH20 Plateau Pressure:  [12 cmH20-22 cmH20] 18 cmH20  INTAKE / OUTPUT: I/O last 3 completed shifts: In: 5280.2 [I.V.:2240.2; NG/GT:2430; IV [id number] Out: 7059 [id number]  PHYSICAL EXAMINATION: General:  Chronically ill appearing male, ETT in place Neuro:  Opens eyes, following some commands (tracks) HEENT:  [place]/AT, PERRL Cardiovascular: Irregularly irregular, 3/6 systolic murmur. 3+ LE to past knees Lungs:  Coarse breath sounds and referred upper airway sounds Abdomen:  Soft non-tender Musculoskeletal: No acute deformity Skin:  Grossly intact. PVD changes to LE. Oozing central line of left neck.   LABS:  BMET  Recent Labs Lab 04/21/16 0340 04/21/16 1530 04/22/16 0420  NA 134* 136 135  K 5.3* 4.9 4.9  CL 96* 100* 102  CO2 24 26 25   BUN 85* 62* 52*  CREATININE 4.27* 2.99* 2.24*  GLUCOSE 254* 215* 241*   Electrolytes  Recent Labs Lab 04/14/2016 0305  04/21/16 0340 04/21/16 1530 04/22/16 0420 04/22/16 0422  CALCIUM 8.1*  < > 8.3* 8.4* 8.3*  --   MG 2.2  --  2.3  --   --  2.4  PHOS 6.4*  < > 5.0* 3.6 2.6  --   < > = values in this interval not displayed.  CBC  Recent Labs Lab 04/19/2016 0305 04/21/16 0340 04/22/16 0422  WBC 18.9* 18.6* 13.3*  HGB 8.4* 8.5* 7.8*  HCT 24.9* 25.6*  23.6*  PLT 28* 43* 30*   Coag's  Recent Labs Lab 04/19/16 1038 04/19/2016 0305 04/21/16 0340 04/22/16 0422  APTT 51*  --  42*  --   INR 2.84 1.70 2.17 2.33   Sepsis Markers  Recent Labs Lab 04/15/2016 1243 04/19/16 1655 04/19/16 1933  LATICACIDVEN 2.4* 1.5 1.3   ABG  Recent Labs Lab 04/01/2016 0347 04/21/16 0355 04/22/16 0231  PHART 7.342* 7.298* 7.394  PCO2ART 43.8 50.9* 44.3  PO2ART 122* 147* 131.0*   Liver Enzymes  Recent Labs Lab 04/01/2016 0433  04/19/16 1038  04/21/16 0340 04/21/16 1530 04/22/16 0420  AST 42*  --  47*  --   --   --   --   ALT 29  --  47  --   --   --   --   ALKPHOS 63  --  101  --   --   --   --   BILITOT 1.1  --  1.6*  --    --   --   --   ALBUMIN 2.8*  < > 1.8*  < > 1.6* 1.6* 1.4*  < > = values in this interval not displayed.  Cardiac Enzymes  Recent Labs Lab 04/19/16 1655 04/19/16 2018 04/21/2016 0305  TROPONINI 0.71* 1.54* 0.28*   Glucose  Recent Labs Lab 04/21/16 1157 04/21/16 1554 04/21/16 2029 04/21/16 2358 04/22/16 0410 04/22/16 0750  GLUCAP 219* 205* 151* 270* 239* 220*   Imaging Dg Chest Port 1 View  Result Date: 04/22/2016 CLINICAL DATA:  Intubation. EXAM: PORTABLE CHEST 1 VIEW COMPARISON:  04/21/2016 and 03/31/2016 FINDINGS: Endotracheal tube has tip 7.7 cm above the carina. Nasogastric tube is coiled once over the stomach with tip and side-port over the stomach in the left upper quadrant. Right IJ central venous catheter and left IJ central venous catheters are unchanged. Lungs are adequately inflated with minimal improvement, but mild residual prominence of the left perihilar vasculature. Better aeration in the left base with possible small amount left pleural fluid/ atelectasis. Mild stable cardiomegaly. Remainder of the exam is unchanged. IMPRESSION: Interval improvement with mild residual prominence of the left perihilar vasculature likely mild asymmetric congestion. Better aeration in the left base with suggestion of a small effusions/atelectasis. Electronically Signed   By: Marin Olp M.D.   On: 04/22/2016 07:08   STUDIES:  Ct head 10/21 > RIGHT facial contusion. No acute intracranial process. Mild chronic small vessel ischemic Disease. 19 mm LEFT thyroid nodule for which follow up thyroid sonogram is recommended on a nonemergent basis. ECHO 10/23 > LVEF 50-55%; mild LVH, R atrium mildly dilated, mild aortic regurg, mod-severe mitral regurg, PA pressure 45  CULTURES: BCx2 10/21 > Pseudomonas putida BCx2 10/23 > Methicillin resistant Staph Catheter tip 10/22 > no growth  ANTIBIOTICS: Zosyn 10/21 >>> Vancomycin 10/21 >>>  SIGNIFICANT EVENTS:   LINES/TUBES: ETT 10/24 > L  IJ CVC 10/24 >  PIVs x2 HD cath RIJ 10/25 >  DISCUSSION: 60 year old male with newly converted ESRD. Admitted 10/21 with AMS and found to have small CVA and now bacteremia likely from tunneled HD cath. On broad spectrum antibiotics, but clinical status deteriorated. 10/24 transferred to ICU for intubation, pressors. Now with suspected aspiration PNA. HD cath replaced 10/25 and CRRT initiated.   ASSESSMENT / PLAN:  PULMONARY A: Acute hypoxemic respiratory failure secondary to aspiration PNA COPD without acute exacerbation Respiratory acidosis P:   Full ventilator support Daily CXRs while intubated ABGs daily  VAP bundle No  weaning given hemodynamics and MODS  CARDIOVASCULAR A:  Shock - likely septic vs cardiogenic with severe MR PAF on warfarin and amiodarone gtt H/o hypotension, HLD  P:  Telemetry monitoring MAP goal > 68mmHg KVO IVF as he appears volume overloaded Levophed infusion for MAP goal 3 mcg TEE pending - not sure if will be necessary, will need to discuss comfort care. Holding home antihypertensives - start back metoprolol when off pressor for afib. Continue amiodarone gtt  RENAL A:   ESRD on HD TTS  P:   Nephrology following, appreciate recommendations CRRT -100 ml/hr BMET in AM Replace electrolytes as indicated.  GASTROINTESTINAL A:   Hepatitis C GERD  P:   NPO Intermittent LFTs  TFs Protonix daily for SUP  HEMATOLOGIC A:   Thrombocytopenia, severe --> stable Warfarin coagulopathy Anemia  P:  Holding coumadin and anticoagulation altogether. Trend CBC and coags Transfuse for Hgb < 7 Platelets is 30 and INR 2.17 (auto anticoagulation).  INFECTIOUS A:   Bacteremia MRSA and Pseudomonas Putida - source possible LE cellulitis Likely aspiration PNA Need to rule out infective endocarditis  P:   Continue Zosyn and Vancomycin  Continue to follow cultures Obtain tracheal aspirate cx? TEE when more stable  ENDOCRINE A:   DM - Last  A1c 6.9 on 04/17/16 Hypothyroid with low TSH CBGs in 200s  P:   CBG monitoring and SSI Consider insulin gtt Lantus 20 daily  NEUROLOGIC A:   Acute metabolic encephalopathy CVA Bipolar disorder P:   RASS goal: 0 to -1 Fentanyl/versed Stroke team following  FAMILY  - Updates: No family bedside.  Brother to arrive   - Inter-disciplinary family meet or Palliative Care meeting due by: 10/31  The patient is critically ill with multiple organ systems failure and requires high complexity decision making for assessment and support, frequent evaluation and titration of therapies, application of advanced monitoring technologies and extensive interpretation of multiple databases.   Critical Care Time devoted to patient care services described in this note is  35  Minutes. This time reflects time of care of this signee Dr [name]. This critical care time does not reflect procedure time, or teaching time or supervisory time of PA/NP/Med student/Med Resident etc but could involve care discussion time.  Rush Farmer, M.D. [facility] Pulmonary/Critical Care Medicine. Pager: [phone]. After hours pager: [phone].   04/22/2016 10:23 AM

## 2016-04-23 ENCOUNTER — Inpatient Hospital Stay (HOSPITAL_COMMUNITY): Payer: Medicaid Other

## 2016-04-23 DIAGNOSIS — Z7189 Other specified counseling: Secondary | ICD-10-CM

## 2016-04-23 LAB — BLOOD GAS, ARTERIAL
ACID-BASE EXCESS: 0.8 mmol/L (ref 0.0–2.0)
Bicarbonate: 26.1 mmol/L (ref 20.0–28.0)
DRAWN BY: 39866
FIO2: 40
MECHVT: 670 mL
O2 SAT: 98.8 %
PCO2 ART: 51.3 mmHg — AB (ref 32.0–48.0)
PEEP/CPAP: 5 cmH2O
PH ART: 7.327 — AB (ref 7.350–7.450)
PO2 ART: 135 mmHg — AB (ref 83.0–108.0)
Patient temperature: 98.6
RATE: 22 resp/min

## 2016-04-23 LAB — GLUCOSE, CAPILLARY
GLUCOSE-CAPILLARY: 193 mg/dL — AB (ref 65–99)
GLUCOSE-CAPILLARY: 203 mg/dL — AB (ref 65–99)
GLUCOSE-CAPILLARY: 210 mg/dL — AB (ref 65–99)
GLUCOSE-CAPILLARY: 223 mg/dL — AB (ref 65–99)
GLUCOSE-CAPILLARY: 231 mg/dL — AB (ref 65–99)
Glucose-Capillary: 216 mg/dL — ABNORMAL HIGH (ref 65–99)

## 2016-04-23 LAB — CBC
HCT: 27.9 % — ABNORMAL LOW (ref 39.0–52.0)
HEMOGLOBIN: 8.8 g/dL — AB (ref 13.0–17.0)
MCH: 28.7 pg (ref 26.0–34.0)
MCHC: 31.5 g/dL (ref 30.0–36.0)
MCV: 90.9 fL (ref 78.0–100.0)
Platelets: 34 10*3/uL — ABNORMAL LOW (ref 150–400)
RBC: 3.07 MIL/uL — AB (ref 4.22–5.81)
RDW: 19.6 % — ABNORMAL HIGH (ref 11.5–15.5)
WBC: 13 10*3/uL — ABNORMAL HIGH (ref 4.0–10.5)

## 2016-04-23 LAB — RENAL FUNCTION PANEL
ALBUMIN: 1.7 g/dL — AB (ref 3.5–5.0)
ANION GAP: 9 (ref 5–15)
Albumin: 1.6 g/dL — ABNORMAL LOW (ref 3.5–5.0)
Anion gap: 8 (ref 5–15)
BUN: 43 mg/dL — ABNORMAL HIGH (ref 6–20)
BUN: 40 mg/dL — AB (ref 6–20)
CALCIUM: 8.6 mg/dL — AB (ref 8.9–10.3)
CALCIUM: 8.7 mg/dL — AB (ref 8.9–10.3)
CHLORIDE: 101 mmol/L (ref 101–111)
CO2: 26 mmol/L (ref 22–32)
CO2: 27 mmol/L (ref 22–32)
CREATININE: 1.68 mg/dL — AB (ref 0.61–1.24)
CREATININE: 1.48 mg/dL — AB (ref 0.61–1.24)
Chloride: 102 mmol/L (ref 101–111)
GFR calc Af Amer: 58 mL/min — ABNORMAL LOW (ref >60)
GFR calc non Af Amer: 43 mL/min — ABNORMAL LOW (ref >60)
GFR calc non Af Amer: 50 mL/min — ABNORMAL LOW (ref >60)
GFR, EST AFRICAN AMERICAN: 49 mL/min — AB (ref >60)
GLUCOSE: 232 mg/dL — AB (ref 65–99)
GLUCOSE: 209 mg/dL — AB (ref 65–99)
PHOSPHORUS: 2.4 mg/dL — AB (ref 2.5–4.6)
Phosphorus: 2.6 mg/dL (ref 2.5–4.6)
Potassium: 5.1 mmol/L (ref 3.5–5.1)
Potassium: 5 mmol/L (ref 3.5–5.1)
SODIUM: 136 mmol/L (ref 135–145)
SODIUM: 137 mmol/L (ref 135–145)

## 2016-04-23 LAB — CULTURE, BLOOD (ROUTINE X 2): Culture: NO GROWTH

## 2016-04-23 LAB — MAGNESIUM: MAGNESIUM: 2.4 mg/dL (ref 1.7–2.4)

## 2016-04-23 MED ORDER — DIGOXIN 0.25 MG/ML IJ SOLN: 0.2500 mg | Freq: Once | INTRAMUSCULAR | Status: DC

## 2016-04-23 NOTE — Progress Notes (Signed)
Subjective:  Intubated and somewhat agitated.  Blood is coming out of his gastric tube atrial fibrillation rate remains elevated  Objective:  Vital Signs in the last 24 hours: BP (!) 118/98   Pulse (!) 110   Temp 97.3 F (36.3 C) (Oral)   Resp (!) 22   Ht 6\' 2"  (1.88 m)   Wt 126.8 kg (279 lb 8.7 oz)   SpO2 100%   BMI 35.89 kg/m   Physical Exam: Ill-appearing white male who is intubated somewhat agitated Lungs:  Rhonchi bilaterally  Cardiac:  Rapid irregular rhythm, normal S1 and S2, no S3 Abdomen:  Soft, nontender, no masses Extremities:  Ulcer on the left second toe, feet are deformed, trace edema  Intake/Output from previous day: 10/27 0701 - 10/28 0700 In: 3712.8 [I.V.:1652.8; NG/GT:1560; IV Piggyback:500] Out: 5829   Weight Filed Weights   04/21/16 0500 04/22/16 0500 04/23/16 0500  Weight: 128.6 kg (283 lb 8.2 oz) 127.7 kg (281 lb 8.4 oz) 126.8 kg (279 lb 8.7 oz)    Lab Results: Basic Metabolic Panel:  Recent Labs  04/22/16 1520 04/23/16 0451  NA 137 136  K 4.8 5.1  CL 103 101  CO2 26 26  GLUCOSE 216* 232*  BUN 43* 43*  CREATININE 1.88* 1.68*   CBC:  Recent Labs  04/22/16 0422 04/23/16 0452  WBC 13.3* 13.0*  HGB 7.8* 8.8*  HCT 23.6* 27.9*  MCV 90.4 90.9  PLT 30* 34*   Telemetry: Atrial fibrillation with rapid ventricular response  Assessment/Plan:  1.  Chronic atrial fibrillation with rapid response currently due to overall illness and withdrawal of higher dose beta blocker on admission 2.  Prior sepsis 3.  Hypotension limiting medical treatment  Recommendations:  He is currently off of Levophed and if his blood pressure holds might be able to restart beta blockers intravenously.  He has blood coming out of his NG tube now.  We'll give an additional dose of Lanoxin today.     Kerry Hough  MD [facility] Cardiology  04/23/2016, 12:05 PM

## 2016-04-23 NOTE — Progress Notes (Signed)
Pharmacy Antibiotic Note  Johnny Sims is a 60 y.o. male admitted on 04/02/2016 with pan sensitive pseudomonas bacteremia. Day 9 of antibiotics total, broadened on 10/24 to vancomycin and Zosyn due to clinical worsening on cefepime monotherapy. Also concern over lower extremity necrotic wounds. Recently started on HD earlier this month and now transitioned to CRRT in ICU.  Plan: -Continue Zosyn 3.375g q6hr -Continue vancomycin 1.25g IV q24h  -Monitor clinical picture, culture data, length of therapy, vancomycin trough as indicated   Height: 6\' 2"  (188 cm) Weight: 279 lb 8.7 oz (126.8 kg) IBW/kg (Calculated) : 82.2  Temp (24hrs), Avg:97.2 F (36.2 C), Min:96 F (35.6 C), Max:98 F (36.7 C)   Recent Labs Lab 04/19/16 1655  04/19/16 1933 04/13/2016 0305  04/21/16 0340 04/21/16 1530 04/22/16 0420 04/22/16 0422 04/22/16 1520 04/23/16 0451 04/23/16 0452  WBC 18.8*  --   --  18.9*  --  18.6*  --   --  13.3*  --   --  13.0*  CREATININE 5.65*  < >  --  5.65*  < > 4.27* 2.99* 2.24*  --  1.88* 1.68*  --   LATICACIDVEN 1.5  --  1.3  --   --   --   --   --   --   --   --   --   < > = values in this interval not displayed.  Estimated Creatinine Clearance: 66.1 mL/min (by C-G formula based on SCr of 1.68 mg/dL (H)).    Antimicrobials this admission:  10/21 Cefepime >>10/24 10/21 Vancomycin >> 10/22; 10/24 >> 10/24 Zosyn >>  Dose adjustments this admission:  10/25: Vancomycin and Zosyn adjusted for CRRT  Microbiology results:  10/21 BCx: Pseudomonas (pan sens) 10/21 MRSA PCR: neg 10/23 BCx: 1/2 CoNS  Thank you for allowing pharmacy to be a part of this patient's care.  Salome Arnt, PharmD, BCPS Pager # [phone] 04/23/2016 1:53 PM

## 2016-04-23 NOTE — Progress Notes (Signed)
Subjective: Interval History: on vent, opens eyes, not coherent.  Objective: Vital signs in last 24 hours: Temp:  [96 F (35.6 C)-98.2 F (36.8 C)] 97.5 F (36.4 C) (10/27 2315) Pulse Rate:  [32-127] 61 (10/28 0600) Resp:  [14-26] 23 (10/28 0600) BP: (75-208)/(46-162) 101/69 (10/28 0600) SpO2:  [99 %-100 %] 100 % (10/28 0600) FiO2 (%):  [40 %] 40 % (10/28 0424) Weight:  [126.8 kg (279 lb 8.7 oz)] 126.8 kg (279 lb 8.7 oz) (10/28 0500) Weight change: -0.9 kg (-1 lb 15.8 oz)  Intake/Output from previous day: 10/27 0701 - 10/28 0700 In: 3477.3 [I.V.:1582.3; NG/GT:1495; IV Piggyback:400] Out: 5582  Intake/Output this shift: No intake/output data recorded.  General appearance: moderately obese Neck: RIJ cath, L IJ iv Resp: rales bibasilar and rhonchi bibasilar Cardio: irregularly irregular rhythm and systolic murmur: holosystolic 2/6, blowing at apex GI: obese, pos bs, liver down 6 cm Extremities: AVF LUA B&T. stasis changes calves, erthythema up in to thighs, less, 3+ edema extending presacral,  Sedated on vent opens eyes, no coop, off pressors Lab Results:  Recent Labs  04/22/16 0422 04/23/16 0452  WBC 13.3* 13.0*  HGB 7.8* 8.8*  HCT 23.6* 27.9*  PLT 30* 34*   BMET:  Recent Labs  04/22/16 1520 04/23/16 0451  NA 137 136  K 4.8 5.1  CL 103 101  CO2 26 26  GLUCOSE 216* 232*  BUN 43* 43*  CREATININE 1.88* 1.68*  CALCIUM 8.5* 8.6*   No results for input(s): PTH in the last 72 hours. Iron Studies: No results for input(s): IRON, TIBC, TRANSFERRIN, FERRITIN in the last 72 hours.  Studies/Results: Dg Chest Port 1 View  Result Date: 04/22/2016 CLINICAL DATA:  Intubation. EXAM: PORTABLE CHEST 1 VIEW COMPARISON:  04/21/2016 and 04/02/2016 FINDINGS: Endotracheal tube has tip 7.7 cm above the carina. Nasogastric tube is coiled once over the stomach with tip and side-port over the stomach in the left upper quadrant. Right IJ central venous catheter and left IJ central  venous catheters are unchanged. Lungs are adequately inflated with minimal improvement, but mild residual prominence of the left perihilar vasculature. Better aeration in the left base with possible small amount left pleural fluid/ atelectasis. Mild stable cardiomegaly. Remainder of the exam is unchanged. IMPRESSION: Interval improvement with mild residual prominence of the left perihilar vasculature likely mild asymmetric congestion. Better aeration in the left base with suggestion of a small effusions/atelectasis. Electronically Signed   By: Marin Olp M.D.   On: 04/22/2016 07:08    I have reviewed the patient's current medications.  Assessment/Plan: 1 ESRD vol xs. Dose 15ml/kg/h.  Good solute, acid/base/K/phos.  Mechanics ok  2 VDRF per CCM 3 Anemia improved, on esa 4 Sepsis with Pseudomonas, and Staph??  Probable leg source 5 DM controlled 6 Fluid overload, lower 7 Coagulopathy  No INR but ptlt low 8Nutrition on TF 9 obesity 10 Bipolar P CRRT, cont clearance, ^ net neg, AB, esa, vent, TF   LOS: 7 days   Krishana Lutze L 04/23/2016,7:15 AM

## 2016-04-23 NOTE — Progress Notes (Signed)
PULMONARY / CRITICAL CARE MEDICINE   Name: Johnny Sims MRN: [id number] DOB: [birth year]    ADMISSION DATE:  04/18/2016 CONSULTATION DATE:  04/19/2016  REFERRING MD:  Dr. Thayer Headings  CHIEF COMPLAINT:  AMS now hypotension  HISTORY OF PRESENT ILLNESS:   60 year old male with PMH as below, which is significant for ESRD on HD TTS, DM, COPD, PAF on warfarin, and dCHF. He was recently 10/2 admitted for hypervolemia in the setting of progression of his CKD at which point HD was initiated through newly placed tunneled catheter. Once euvolemic (4 HD treatments) he was discharged 10/7. 10/19 he presented to ED post fall and was found to have INR elevation, but with the exception with some bruising he was otherwise stable and was discharged. 10/21 he again presented to [facility] ED, this time with altered mental status and slurred speech.  There was concern for CVA as he presented with AF-RVR. But INR found to be elevated at 8.  MRI demonstrated tiny R frontal lobe infarct. Not clear whether this is entirely the cause of his altered mental status. There was also concern for sepsis in setting of leukopenia and CXR infiltrate so he was started on cefepime and vancomycin. Initial blood cultures started to grow GNR and tunneled HD cath was pulled 10/22. Turned out to be pseudomonas putida pan-sensitive. Repeat cultures on 10/23 with MRSA. ABX remain Vancomycin and Zosyn. TTE did not describe vegetation. 10/24 he became hypotensive and airway protection worsened. PCCM asked to see.   SUBJECTIVE:  Remains on levo 3. CRRT -100 ml/hr.  Bleeding orally overnight.  VITAL SIGNS: BP 104/84   Pulse (!) 55   Temp 97.3 F (36.3 C) (Oral)   Resp 18   Ht 6\' 2"  (1.88 m)   Wt 126.8 kg (279 lb 8.7 oz)   SpO2 100%   BMI 35.89 kg/m   HEMODYNAMICS:    VENTILATOR SETTINGS: Vent Mode: PRVC FiO2 (%):  [40 %] 40 % Set Rate:  [22 bmp] 22 bmp Vt Set:  [670 mL] 670 mL PEEP:  [5 cmH20] 5 cmH20 Plateau Pressure:  [18  cmH20-26 cmH20] 19 cmH20  INTAKE / OUTPUT: I/O last 3 completed shifts: In: 5330.8 [I.V.:2440.8; NG/GT:2340; IV Piggyback:550] Out: [id number] [Other:7898]  PHYSICAL EXAMINATION: General:  Chronically ill appearing male, ETT in place Neuro:  Opens eyes, following some commands (tracks) HEENT:  [place]/AT, PERRL Cardiovascular: Irregularly irregular, 3/6 systolic murmur. 3+ LE to past knees Lungs:  Coarse breath sounds and referred upper airway sounds Abdomen:  Soft non-tender Musculoskeletal: No acute deformity Skin:  Grossly intact. PVD changes to LE. Oozing central line of left neck.   LABS:  BMET  Recent Labs Lab 04/22/16 0420 04/22/16 1520 04/23/16 0451  NA 135 137 136  K 4.9 4.8 5.1  CL 102 103 101  CO2 25 26 26   BUN 52* 43* 43*  CREATININE 2.24* 1.88* 1.68*  GLUCOSE 241* 216* 232*   Electrolytes  Recent Labs Lab 04/21/16 0340  04/22/16 0420 04/22/16 0422 04/22/16 1520 04/23/16 0451 04/23/16 0452  CALCIUM 8.3*  < > 8.3*  --  8.5* 8.6*  --   MG 2.3  --   --  2.4  --   --  2.4  PHOS 5.0*  < > 2.6  --  2.7 2.6  --   < > = values in this interval not displayed.  CBC  Recent Labs Lab 04/21/16 0340 04/22/16 0422 04/23/16 0452  WBC 18.6* 13.3* 13.0*  HGB  8.5* 7.8* 8.8*  HCT 25.6* 23.6* 27.9*  PLT 43* 30* 34*   Coag's  Recent Labs Lab 04/19/16 1038 04/25/2016 0305 04/21/16 0340 04/22/16 0422  APTT 51*  --  42*  --   INR 2.84 1.70 2.17 2.33   Sepsis Markers  Recent Labs Lab 04/07/2016 1243 04/19/16 1655 04/19/16 1933  LATICACIDVEN 2.4* 1.5 1.3   ABG  Recent Labs Lab 04/21/16 0355 04/22/16 0231 04/23/16 0420  PHART 7.298* 7.394 7.327*  PCO2ART 50.9* 44.3 51.3*  PO2ART 147* 131.0* 135*   Liver Enzymes  Recent Labs Lab 04/19/16 1038  04/22/16 0420 04/22/16 1520 04/23/16 0451  AST 47*  --   --   --   --   ALT 47  --   --   --   --   ALKPHOS 101  --   --   --   --   BILITOT 1.6*  --   --   --   --   ALBUMIN 1.8*  < > 1.4* 1.5* 1.6*  < >  = values in this interval not displayed.  Cardiac Enzymes  Recent Labs Lab 04/19/16 1655 04/19/16 2018 03/29/2016 0305  TROPONINI 0.71* 1.54* 0.28*   Glucose  Recent Labs Lab 04/22/16 1118 04/22/16 1547 04/22/16 2006 04/22/16 2333 04/23/16 0425 04/23/16 0823  GLUCAP 160* 196* 251* 283* 216* 193*   Imaging Dg Chest Port 1 View  Result Date: 04/23/2016 CLINICAL DATA:  Respiratory failure. EXAM: PORTABLE CHEST 1 VIEW COMPARISON:  04/22/2016 FINDINGS: Endotracheal tube remains with tip approximately 6.5 cm above the carina. Gastric decompression tube extends below the diaphragm. Large bore central venous catheter via right jugular vein stable with tip in SVC. Additional left jugular central venous catheter stable with tip in SVC. Relatively stable interstitial edema. Stable cardiac enlargement. No significant pleural effusions identified. No evidence of pneumothorax. IMPRESSION: Stable interstitial edema and cardiomegaly. Electronically Signed   By: Aletta Edouard M.D.   On: 04/23/2016 09:02   STUDIES:  Ct head 10/21 > RIGHT facial contusion. No acute intracranial process. Mild chronic small vessel ischemic Disease. 19 mm LEFT thyroid nodule for which follow up thyroid sonogram is recommended on a nonemergent basis. ECHO 10/23 > LVEF 50-55%; mild LVH, R atrium mildly dilated, mild aortic regurg, mod-severe mitral regurg, PA pressure 45  CULTURES: BCx2 10/21 > Pseudomonas putida BCx2 10/23 > Methicillin resistant Staph Catheter tip 10/22 > no growth  ANTIBIOTICS: Zosyn 10/21 >>> Vancomycin 10/21 >>>  SIGNIFICANT EVENTS:   LINES/TUBES: ETT 10/24 > L IJ CVC 10/24 >  PIVs x2 HD cath RIJ 10/25 >  DISCUSSION: 60 year old male with newly converted ESRD. Admitted 10/21 with AMS and found to have small CVA and now bacteremia likely from tunneled HD cath. On broad spectrum antibiotics, but clinical status deteriorated. 10/24 transferred to ICU for intubation, pressors. Now with  suspected aspiration PNA. HD cath replaced 10/25 and CRRT initiated.   ASSESSMENT / PLAN:  PULMONARY A: Acute hypoxemic respiratory failure secondary to aspiration PNA COPD without acute exacerbation Respiratory acidosis P:   Full ventilator support Daily CXRs while intubated ABGs daily  VAP bundle Begin PS trials.  CARDIOVASCULAR A:  Shock - likely septic vs cardiogenic with severe MR PAF on warfarin and amiodarone gtt H/o hypotension, HLD  P:  Telemetry monitoring MAP goal > 32mmHg KVO IVF as he appears volume overloaded Levophed drip with titration. TEE pending - not sure if will be necessary, will need to discuss comfort care. Holding home  antihypertensives - start back metoprolol when off pressor for afib. Continue amiodarone gtt  RENAL A:   ESRD on HD TTS  P:   Nephrology following, appreciate recommendations CRRT -100 ml/hr BMET in AM Replace electrolytes as indicated.  GASTROINTESTINAL A:   Hepatitis C GERD  P:   NPO Intermittent LFTs  TFs Protonix daily for SUP  HEMATOLOGIC A:   Thrombocytopenia, severe --> stable Warfarin coagulopathy Anemia  P:  Holding coumadin and anticoagulation altogether. Trend CBC and coags Transfuse for Hgb < 7 Platelets is 34 and INR 2.33 (auto anticoagulation).  INFECTIOUS A:   Bacteremia MRSA and Pseudomonas Putida - source possible LE cellulitis Likely aspiration PNA Need to rule out infective endocarditis  P:   Continue Zosyn and Vancomycin  Continue to follow cultures TEE when more stable  ENDOCRINE A:   DM - Last A1c 6.9 on 04/17/16 Hypothyroid with low TSH CBGs in 200s  P:   CBG monitoring and SSI Consider insulin gtt Lantus 20 daily  NEUROLOGIC A:   Acute metabolic encephalopathy CVA Bipolar disorder P:   RASS goal: 0 to -1 Fentanyl/versed Stroke team following  FAMILY  - Updates: Spoke with brother and neighbor (only family the patient has as per them).  After discussion, they  understand patient has no reasonable chance at full recovery.  They only want him comfortable.  Will make DNR now and once extubated then no intention to reintubate.  If unable to extubate by Monday then will proceed with comfort care.  - Inter-disciplinary family meet or Palliative Care meeting due by: 10/31  The patient is critically ill with multiple organ systems failure and requires high complexity decision making for assessment and support, frequent evaluation and titration of therapies, application of advanced monitoring technologies and extensive interpretation of multiple databases.   Critical Care Time devoted to patient care services described in this note is  35  Minutes. This time reflects time of care of this signee Dr [name]. This critical care time does not reflect procedure time, or teaching time or supervisory time of PA/NP/Med student/Med Resident etc but could involve care discussion time.  Rush Farmer, M.D. [facility] Pulmonary/Critical Care Medicine. Pager: [phone]. After hours pager: [phone].   04/23/2016 11:43 AM

## 2016-04-23 NOTE — Progress Notes (Signed)
   04/23/16 1106  Clinical Encounter Type  Visited With Patient and family together  Visit Type Follow-up;Spiritual support  Referral From Chaplain  Consult/Referral To Chaplain  Spiritual Encounters  Spiritual Needs Prayer;Grief support;Emotional  Stress Factors  Patient Stress Factors Health changes  Family Stress Factors Health changes  Pt. Is expected to be terminally intibated today, Family wanted a ministry of presence, spiritual support, and grief support, rendered a prayer with patient and family.  Brother and Neighbor present, in acceptance of situation, spiritual family, just wanted the presence of a Chaplain for support and prayers.

## 2016-04-24 ENCOUNTER — Inpatient Hospital Stay (HOSPITAL_COMMUNITY): Payer: Medicaid Other

## 2016-04-24 LAB — BLOOD GAS, ARTERIAL
Acid-Base Excess: 0.9 mmol/L (ref 0.0–2.0)
Bicarbonate: 25.7 mmol/L (ref 20.0–28.0)
DRAWN BY: 398661
FIO2: 40
MECHVT: 670 mL
O2 Saturation: 96.3 %
PEEP/CPAP: 5 cmH2O
PO2 ART: 86.2 mmHg (ref 83.0–108.0)
Patient temperature: 98.6
RATE: 22 resp/min
pCO2 arterial: 46.5 mmHg (ref 32.0–48.0)
pH, Arterial: 7.361 (ref 7.350–7.450)

## 2016-04-24 LAB — BASIC METABOLIC PANEL
Anion gap: 7 (ref 5–15)
BUN: 43 mg/dL — AB (ref 6–20)
CO2: 25 mmol/L (ref 22–32)
CREATININE: 1.46 mg/dL — AB (ref 0.61–1.24)
Calcium: 8.5 mg/dL — ABNORMAL LOW (ref 8.9–10.3)
Chloride: 104 mmol/L (ref 101–111)
GFR calc Af Amer: 59 mL/min — ABNORMAL LOW (ref >60)
GFR, EST NON AFRICAN AMERICAN: 51 mL/min — AB (ref >60)
Glucose, Bld: 275 mg/dL — ABNORMAL HIGH (ref 65–99)
Potassium: 5 mmol/L (ref 3.5–5.1)
SODIUM: 136 mmol/L (ref 135–145)

## 2016-04-24 LAB — GLUCOSE, CAPILLARY
GLUCOSE-CAPILLARY: 191 mg/dL — AB (ref 65–99)
GLUCOSE-CAPILLARY: 221 mg/dL — AB (ref 65–99)
GLUCOSE-CAPILLARY: 225 mg/dL — AB (ref 65–99)
GLUCOSE-CAPILLARY: 237 mg/dL — AB (ref 65–99)
Glucose-Capillary: 256 mg/dL — ABNORMAL HIGH (ref 65–99)
Glucose-Capillary: 206 mg/dL — ABNORMAL HIGH (ref 65–99)

## 2016-04-24 LAB — RENAL FUNCTION PANEL
Albumin: 1.8 g/dL — ABNORMAL LOW (ref 3.5–5.0)
Anion gap: 7 (ref 5–15)
BUN: 42 mg/dL — AB (ref 6–20)
CALCIUM: 8.8 mg/dL — AB (ref 8.9–10.3)
CO2: 25 mmol/L (ref 22–32)
CREATININE: 1.44 mg/dL — AB (ref 0.61–1.24)
Chloride: 103 mmol/L (ref 101–111)
GFR, EST AFRICAN AMERICAN: 60 mL/min — AB (ref >60)
GFR, EST NON AFRICAN AMERICAN: 51 mL/min — AB (ref >60)
Glucose, Bld: 199 mg/dL — ABNORMAL HIGH (ref 65–99)
Phosphorus: 2.3 mg/dL — ABNORMAL LOW (ref 2.5–4.6)
Potassium: 4.7 mmol/L (ref 3.5–5.1)
SODIUM: 135 mmol/L (ref 135–145)

## 2016-04-24 LAB — CBC
HCT: 27.8 % — ABNORMAL LOW (ref 39.0–52.0)
Hemoglobin: 8.7 g/dL — ABNORMAL LOW (ref 13.0–17.0)
MCH: 29.2 pg (ref 26.0–34.0)
MCHC: 31.3 g/dL (ref 30.0–36.0)
MCV: 93.3 fL (ref 78.0–100.0)
PLATELETS: 38 10*3/uL — AB (ref 150–400)
RBC: 2.98 MIL/uL — ABNORMAL LOW (ref 4.22–5.81)
RDW: 19.4 % — ABNORMAL HIGH (ref 11.5–15.5)
WBC: 12.4 10*3/uL — ABNORMAL HIGH (ref 4.0–10.5)

## 2016-04-24 LAB — PHOSPHORUS: Phosphorus: 2.2 mg/dL — ABNORMAL LOW (ref 2.5–4.6)

## 2016-04-24 LAB — MAGNESIUM: MAGNESIUM: 2.4 mg/dL (ref 1.7–2.4)

## 2016-04-24 NOTE — Progress Notes (Signed)
Subjective:  Intubated and somewhat agitated.  Continues with elevated atrial fibrillation.  Not able to follow commands at present time.    Objective:  Vital Signs in the last 24 hours: BP (!) 160/77   Pulse (!) 50   Temp 98.4 F (36.9 C) (Oral)   Resp 20   Ht 6\' 2"  (1.88 m)   Wt 119.9 kg (264 lb 5.3 oz)   SpO2 (!) 86%   BMI 33.94 kg/m   Physical Exam: Ill-appearing white male who is intubated sedated  Lungs:  Rhonchi bilaterally  Cardiac:  Rapid irregular rhythm, normal S1 and S2, no S3 Abdomen:  Soft, nontender, no masses Extremities:  Ulcer on the left second toe, feet are deformed, trace edema , changes of chronic venous insufficiency  Intake/Output from previous day: 10/28 0701 - 10/29 0700 In: 3970.6 [I.V.:1730.6; NG/GT:1680; IV Piggyback:560] Out: 8298 [Urine:200]  Weight Filed Weights   04/22/16 0500 04/23/16 0500 04/24/16 0416  Weight: 127.7 kg (281 lb 8.4 oz) 126.8 kg (279 lb 8.7 oz) 119.9 kg (264 lb 5.3 oz)    Lab Results: Basic Metabolic Panel:  Recent Labs  04/23/16 1558 04/24/16 0444  NA 137 136  K 5.0 5.0  CL 102 104  CO2 27 25  GLUCOSE 209* 275*  BUN 40* 43*  CREATININE 1.48* 1.46*   CBC:  Recent Labs  04/23/16 0452 04/24/16 0444  WBC 13.0* 12.4*  HGB 8.8* 8.7*  HCT 27.9* 27.8*  MCV 90.9 93.3  PLT 34* 38*   Telemetry: Atrial fibrillation with rapid ventricular response  Assessment/Plan:  1.  Chronic atrial fibrillation with rapid response currently due to overall illness and withdrawal of higher dose beta blocker on admission 2.  Prior sepsis 3.  Hypotension limiting medical treatment  Recommendations:  Atrial fibrillation rate remains increased consider beta blockers intravenously or per tube help with rate control.  Hypotension earlier today was noted.      Kerry Hough  MD [facility]  04/24/2016, 11:13 AM

## 2016-04-24 NOTE — Progress Notes (Signed)
Subjective: Interval History: Is DNR, bp correlates with sedation.  DNR  Objective: Vital signs in last 24 hours: Temp:  [96.3 F (35.7 C)-98.2 F (36.8 C)] 98.2 F (36.8 C) (10/29 0443) Pulse Rate:  [25-155] 67 (10/29 0645) Resp:  [8-28] 22 (10/29 0645) BP: (70-196)/(42-174) 83/51 (10/29 0645) SpO2:  [69 %-100 %] 100 % (10/29 0645) FiO2 (%):  [40 %] 40 % (10/29 0414) Weight:  [119.9 kg (264 lb 5.3 oz)] 119.9 kg (264 lb 5.3 oz) (10/29 0416) Weight change: -6.9 kg (-15 lb 3.4 oz)  Intake/Output from previous day: 10/28 0701 - 10/29 0700 In: 3978.6 [I.V.:1738.6; NG/GT:1680; IV Piggyback:560] Out: 8298 [Urine:200] Intake/Output this shift: No intake/output data recorded.  General appearance: pale and moves all extrem, not coop, entub, NG.  Neck: LIJ iv, RIJ HD cath Resp: rhonchi bilaterally Cardio: irregularly irregular rhythm, S1, S2 normal and systolic murmur: holosystolic 2/6, blowing at apex GI: pos bs, mod distended 3-4+, AVF LUAremities: edema as above and erythema extending into thighs, stasis changes LE  Lab Results:  Recent Labs  04/23/16 0452 04/24/16 0444  WBC 13.0* 12.4*  HGB 8.8* 8.7*  HCT 27.9* 27.8*  PLT 34* 38*   BMET:  Recent Labs  04/23/16 1558 04/24/16 0444  NA 137 136  K 5.0 5.0  CL 102 104  CO2 27 25  GLUCOSE 209* 275*  BUN 40* 43*  CREATININE 1.48* 1.46*  CALCIUM 8.7* 8.5*   No results for input(s): PTH in the last 72 hours. Iron Studies: No results for input(s): IRON, TIBC, TRANSFERRIN, FERRITIN in the last 72 hours.  Studies/Results: Dg Chest Port 1 View  Result Date: 04/23/2016 CLINICAL DATA:  Respiratory failure. EXAM: PORTABLE CHEST 1 VIEW COMPARISON:  04/22/2016 FINDINGS: Endotracheal tube remains with tip approximately 6.5 cm above the carina. Gastric decompression tube extends below the diaphragm. Large bore central venous catheter via right jugular vein stable with tip in SVC. Additional left jugular central venous catheter  stable with tip in SVC. Relatively stable interstitial edema. Stable cardiac enlargement. No significant pleural effusions identified. No evidence of pneumothorax. IMPRESSION: Stable interstitial edema and cardiomegaly. Electronically Signed   By: Aletta Edouard M.D.   On: 04/23/2016 09:02    I have reviewed the patient's current medications.  Assessment/Plan: 1 ESRD vol xs, tol removal.  Solute/acid/base/K/phos ok. 2 Anemia stable 3 HPTH med 4 Coagulopathy no change, check INR 5 Nutrition TF 6 VDRF per CCM 7 Afib still rapid 8 DM controlled 9 Bipolar 10 Sepsis Pseudo, ?Staph, on AB P AB, esa, check INR, cont CRRT, TF   LOS: 8 days   Garnell Phenix L 04/24/2016,7:41 AM

## 2016-04-24 NOTE — Progress Notes (Signed)
PULMONARY / CRITICAL CARE MEDICINE   Name: Johnny Sims MRN: [id number] DOB: 06/03/1956    ADMISSION DATE:  04/14/2016 CONSULTATION DATE:  04/19/2016  REFERRING MD:  Dr. Thayer Headings  CHIEF COMPLAINT:  AMS now hypotension  HISTORY OF PRESENT ILLNESS:   60 year old male with PMH as below, which is significant for ESRD on HD TTS, DM, COPD, PAF on warfarin, and dCHF. He was recently 10/2 admitted for hypervolemia in the setting of progression of his CKD at which point HD was initiated through newly placed tunneled catheter. Once euvolemic (4 HD treatments) he was discharged 10/7. 10/19 he presented to ED post fall and was found to have INR elevation, but with the exception with some bruising he was otherwise stable and was discharged. 10/21 he again presented to [facility] ED, this time with altered mental status and slurred speech.  There was concern for CVA as he presented with AF-RVR. But INR found to be elevated at 8.  MRI demonstrated tiny R frontal lobe infarct. Not clear whether this is entirely the cause of his altered mental status. There was also concern for sepsis in setting of leukopenia and CXR infiltrate so he was started on cefepime and vancomycin. Initial blood cultures started to grow GNR and tunneled HD cath was pulled 10/22. Turned out to be pseudomonas putida pan-sensitive. Repeat cultures on 10/23 with MRSA. ABX remain Vancomycin and Zosyn. TTE did not describe vegetation. 10/24 he became hypotensive and airway protection worsened. PCCM asked to see.   SUBJECTIVE:  Levo down to 2, remains fluid overloaded however.  No events overnight.  Severe agitation at times.  VITAL SIGNS: BP (!) 160/77   Pulse (!) 50   Temp 98.4 F (36.9 C) (Oral)   Resp 20   Ht 6\' 2"  (1.88 m)   Wt 119.9 kg (264 lb 5.3 oz)   SpO2 (!) 86%   BMI 33.94 kg/m   HEMODYNAMICS: CVP:  [6 mmHg-11 mmHg] 11 mmHg  VENTILATOR SETTINGS: Vent Mode: PRVC FiO2 (%):  [40 %] 40 % Set Rate:  [22 bmp] 22 bmp Vt  Set:  [670 mL] 670 mL PEEP:  [5 cmH20] 5 cmH20 Plateau Pressure:  [16 cmH20-24 cmH20] 24 cmH20  INTAKE / OUTPUT: I/O last 3 completed shifts: In: 5687.4 [I.V.:2567.4; NG/GT:2460; IV Piggyback:660] Out: [id number] [Urine:200; [id number]  PHYSICAL EXAMINATION: General:  Chronically ill appearing male, ETT in place.  Agitated and not purposeful. Neuro:  Opens eyes, following some commands (tracks) HEENT:  [place]/AT, PERRL Cardiovascular: Irregularly irregular, 3/6 systolic murmur. 3+ LE to past knees Lungs:  Coarse breath sounds and referred upper airway sounds Abdomen:  Soft non-tender Musculoskeletal: No acute deformity Skin:  Grossly intact. PVD changes to LE. Oozing central line of left neck.   LABS:  BMET  Recent Labs Lab 04/23/16 0451 04/23/16 1558 04/24/16 0444  NA 136 137 136  K 5.1 5.0 5.0  CL 101 102 104  CO2 26 27 25   BUN 43* 40* 43*  CREATININE 1.68* 1.48* 1.46*  GLUCOSE 232* 209* 275*   Electrolytes  Recent Labs Lab 04/22/16 0422  04/23/16 0451 04/23/16 0452 04/23/16 1558 04/24/16 0444  CALCIUM  --   < > 8.6*  --  8.7* 8.5*  MG 2.4  --   --  2.4  --  2.4  PHOS  --   < > 2.6  --  2.4* 2.2*  < > = values in this interval not displayed.  CBC  Recent Labs Lab 04/22/16  0422 04/23/16 0452 04/24/16 0444  WBC 13.3* 13.0* 12.4*  HGB 7.8* 8.8* 8.7*  HCT 23.6* 27.9* 27.8*  PLT 30* 34* 38*   Coag's  Recent Labs Lab 04/19/16 1038 04/01/2016 0305 04/21/16 0340 04/22/16 0422  APTT 51*  --  42*  --   INR 2.84 1.70 2.17 2.33   Sepsis Markers  Recent Labs Lab 04/19/16 1655 04/19/16 1933  LATICACIDVEN 1.5 1.3   ABG  Recent Labs Lab 04/22/16 0231 04/23/16 0420 04/24/16 0425  PHART 7.394 7.327* 7.361  PCO2ART 44.3 51.3* 46.5  PO2ART 131.0* 135* 86.2   Liver Enzymes  Recent Labs Lab 04/19/16 1038  04/22/16 1520 04/23/16 0451 04/23/16 1558  AST 47*  --   --   --   --   ALT 47  --   --   --   --   ALKPHOS 101  --   --   --   --   BILITOT  1.6*  --   --   --   --   ALBUMIN 1.8*  < > 1.5* 1.6* 1.7*  < > = values in this interval not displayed.  Cardiac Enzymes  Recent Labs Lab 04/19/16 1655 04/19/16 2018 04/18/2016 0305  TROPONINI 0.71* 1.54* 0.28*   Glucose  Recent Labs Lab 04/23/16 1208 04/23/16 1611 04/23/16 1955 04/23/16 2358 04/24/16 0442 04/24/16 0804  GLUCAP 231* 203* 210* 223* 256* 206*   Imaging Dg Chest Port 1 View  Result Date: 04/24/2016 CLINICAL DATA:  Intubated EXAM: PORTABLE CHEST 1 VIEW COMPARISON:  Chest radiograph from one day prior. FINDINGS: Endotracheal tube tip is 7.4 cm above the carina. Enteric tube terminates in the gastric fundus. Right internal jugular central venous catheter terminates in the right brachiocephalic vein. Left internal jugular central venous catheter terminates in the middle third of the superior vena cava. Stable cardiomediastinal silhouette with mild cardiomegaly. No pneumothorax. No pleural effusion. No pulmonary edema. Mild bibasilar atelectasis, slightly decreased. IMPRESSION: 1. Support structures as described. no pneumothorax . 2. Stable mild cardiomegaly without pulmonary edema. 3. Mild right basilar atelectasis, decreased. Electronically Signed   By: Ilona Sorrel M.D.   On: 04/24/2016 09:08   STUDIES:  Ct head 10/21 > RIGHT facial contusion. No acute intracranial process. Mild chronic small vessel ischemic Disease. 19 mm LEFT thyroid nodule for which follow up thyroid sonogram is recommended on a nonemergent basis. ECHO 10/23 > LVEF 50-55%; mild LVH, R atrium mildly dilated, mild aortic regurg, mod-severe mitral regurg, PA pressure 45  CULTURES: BCx2 10/21 > Pseudomonas putida BCx2 10/23 > Methicillin resistant Staph Catheter tip 10/22 > no growth  ANTIBIOTICS: Zosyn 10/21 >>> Vancomycin 10/21 >>>  SIGNIFICANT EVENTS:   LINES/TUBES: ETT 10/24 > L IJ CVC 10/24 >  PIVs x2 HD cath RIJ 10/25 >  DISCUSSION: 60 year old male with newly converted ESRD.  Admitted 10/21 with AMS and found to have small CVA and now bacteremia likely from tunneled HD cath. On broad spectrum antibiotics, but clinical status deteriorated. 10/24 transferred to ICU for intubation, pressors. Now with suspected aspiration PNA. HD cath replaced 10/25 and CRRT initiated.   ASSESSMENT / PLAN:  PULMONARY A: Acute hypoxemic respiratory failure secondary to aspiration PNA COPD without acute exacerbation Respiratory acidosis P:   Full ventilator support Daily CXRs while intubated ABGs daily  VAP bundle No trach/peg per family's request  CARDIOVASCULAR A:  Shock - likely septic vs cardiogenic with severe MR PAF on warfarin and amiodarone gtt H/o hypotension, HLD  P:  Telemetry monitoring MAP goal > 52mmHg KVO IVF as he appears volume overloaded Levophed drip with titration. TEE pending - not sure if will be necessary, will need to discuss comfort care. Holding home antihypertensives - start back metoprolol when off pressor for afib. Continue amiodarone gtt  RENAL A:   ESRD on HD TTS  P:   Nephrology following, appreciate recommendations CRRT negative balance BMET in AM Replace electrolytes as indicated.  GASTROINTESTINAL A:   Hepatitis C GERD  P:   NPO Intermittent LFTs  TFs Protonix daily for SUP  HEMATOLOGIC A:   Thrombocytopenia, severe --> stable Warfarin coagulopathy Anemia  P:  Holding coumadin and anticoagulation altogether. Trend CBC and coags Transfuse for Hgb < 7 Thrombocytopenia  INFECTIOUS A:   Bacteremia MRSA and Pseudomonas Putida - source possible LE cellulitis Likely aspiration PNA Need to rule out infective endocarditis  P:   Continue Zosyn and Vancomycin  Continue to follow cultures TEE when more stable  ENDOCRINE A:   DM - Last A1c 6.9 on 04/17/16 Hypothyroid with low TSH CBGs in 200s  P:   CBG monitoring and SSI Consider insulin gtt Lantus 20 daily  NEUROLOGIC A:   Acute metabolic  encephalopathy CVA Bipolar disorder P:   RASS goal: 0 to -1 Fentanyl/versed Stroke team following  FAMILY  - Updates: Spoke with brother and neighbor (only family the patient has as per them).  After discussion, they understand patient has no reasonable chance at full recovery.  They only want him comfortable.  Will make DNR now and once extubated then no intention to reintubate.  If unable to extubate by Monday then will proceed with comfort care.  - Inter-disciplinary family meet or Palliative Care meeting due by: 10/31  The patient is critically ill with multiple organ systems failure and requires high complexity decision making for assessment and support, frequent evaluation and titration of therapies, application of advanced monitoring technologies and extensive interpretation of multiple databases.   Critical Care Time devoted to patient care services described in this note is  35  Minutes. This time reflects time of care of this signee Dr [name]. This critical care time does not reflect procedure time, or teaching time or supervisory time of PA/NP/Med student/Med Resident etc but could involve care discussion time.  Rush Farmer, M.D. [facility] Pulmonary/Critical Care Medicine. Pager: [phone]. After hours pager: [phone].   04/24/2016 9:11 AM

## 2016-04-25 ENCOUNTER — Inpatient Hospital Stay (HOSPITAL_COMMUNITY): Payer: Medicaid Other

## 2016-04-25 DIAGNOSIS — R05 Cough: Secondary | ICD-10-CM

## 2016-04-25 DIAGNOSIS — Z978 Presence of other specified devices: Secondary | ICD-10-CM

## 2016-04-25 DIAGNOSIS — R059 Cough, unspecified: Secondary | ICD-10-CM

## 2016-04-25 DIAGNOSIS — S0083XA Contusion of other part of head, initial encounter: Secondary | ICD-10-CM

## 2016-04-25 LAB — RENAL FUNCTION PANEL
ALBUMIN: 1.9 g/dL — AB (ref 3.5–5.0)
ANION GAP: 8 (ref 5–15)
Albumin: 1.7 g/dL — ABNORMAL LOW (ref 3.5–5.0)
Anion gap: 7 (ref 5–15)
BUN: 41 mg/dL — ABNORMAL HIGH (ref 6–20)
BUN: 43 mg/dL — AB (ref 6–20)
CALCIUM: 8.8 mg/dL — AB (ref 8.9–10.3)
CALCIUM: 8.5 mg/dL — AB (ref 8.9–10.3)
CO2: 24 mmol/L (ref 22–32)
CO2: 25 mmol/L (ref 22–32)
CREATININE: 1.31 mg/dL — AB (ref 0.61–1.24)
Chloride: 104 mmol/L (ref 101–111)
Chloride: 105 mmol/L (ref 101–111)
Creatinine, Ser: 1.37 mg/dL — ABNORMAL HIGH (ref 0.61–1.24)
GFR calc Af Amer: >60 mL/min (ref >60)
GFR calc non Af Amer: 55 mL/min — ABNORMAL LOW (ref >60)
GFR calc non Af Amer: 58 mL/min — ABNORMAL LOW (ref >60)
GLUCOSE: 285 mg/dL — AB (ref 65–99)
Glucose, Bld: 197 mg/dL — ABNORMAL HIGH (ref 65–99)
Phosphorus: 2.9 mg/dL (ref 2.5–4.6)
Phosphorus: 2.3 mg/dL — ABNORMAL LOW (ref 2.5–4.6)
Potassium: 5 mmol/L (ref 3.5–5.1)
Potassium: 4.1 mmol/L (ref 3.5–5.1)
SODIUM: 136 mmol/L (ref 135–145)
SODIUM: 137 mmol/L (ref 135–145)

## 2016-04-25 LAB — BLOOD GAS, ARTERIAL
ACID-BASE EXCESS: 0.7 mmol/L (ref 0.0–2.0)
Bicarbonate: 26.1 mmol/L (ref 20.0–28.0)
DRAWN BY: 398661
FIO2: 0.5
MECHVT: 670 mL
O2 SAT: 98.8 %
PATIENT TEMPERATURE: 98.6
PCO2 ART: 52 mmHg — AB (ref 32.0–48.0)
PH ART: 7.321 — AB (ref 7.350–7.450)
RATE: 22 resp/min
pO2, Arterial: 134 mmHg — ABNORMAL HIGH (ref 83.0–108.0)

## 2016-04-25 LAB — GLUCOSE, CAPILLARY
GLUCOSE-CAPILLARY: 197 mg/dL — AB (ref 65–99)
GLUCOSE-CAPILLARY: 208 mg/dL — AB (ref 65–99)
Glucose-Capillary: 215 mg/dL — ABNORMAL HIGH (ref 65–99)
Glucose-Capillary: 297 mg/dL — ABNORMAL HIGH (ref 65–99)
Glucose-Capillary: 254 mg/dL — ABNORMAL HIGH (ref 65–99)

## 2016-04-25 LAB — MAGNESIUM: MAGNESIUM: 2.5 mg/dL — AB (ref 1.7–2.4)

## 2016-04-25 LAB — BASIC METABOLIC PANEL
Anion gap: 7 (ref 5–15)
BUN: 40 mg/dL — AB (ref 6–20)
CHLORIDE: 104 mmol/L (ref 101–111)
CO2: 25 mmol/L (ref 22–32)
Calcium: 8.8 mg/dL — ABNORMAL LOW (ref 8.9–10.3)
Creatinine, Ser: 1.36 mg/dL — ABNORMAL HIGH (ref 0.61–1.24)
GFR calc Af Amer: >60 mL/min (ref >60)
GFR calc non Af Amer: 55 mL/min — ABNORMAL LOW (ref >60)
GLUCOSE: 197 mg/dL — AB (ref 65–99)
POTASSIUM: 5 mmol/L (ref 3.5–5.1)
Sodium: 136 mmol/L (ref 135–145)

## 2016-04-25 LAB — PHOSPHORUS: Phosphorus: 2.8 mg/dL (ref 2.5–4.6)

## 2016-04-25 LAB — CBC
HCT: 28.7 % — ABNORMAL LOW (ref 39.0–52.0)
HEMOGLOBIN: 9 g/dL — AB (ref 13.0–17.0)
MCH: 29 pg (ref 26.0–34.0)
MCHC: 31.4 g/dL (ref 30.0–36.0)
MCV: 92.6 fL (ref 78.0–100.0)
Platelets: 56 10*3/uL — ABNORMAL LOW (ref 150–400)
RBC: 3.1 MIL/uL — AB (ref 4.22–5.81)
RDW: 19.6 % — ABNORMAL HIGH (ref 11.5–15.5)
WBC: 15.9 10*3/uL — ABNORMAL HIGH (ref 4.0–10.5)

## 2016-04-25 LAB — VANCOMYCIN, TROUGH: Vancomycin Tr: 19 ug/mL (ref 15–20)

## 2016-04-25 LAB — CORTISOL: Cortisol, Plasma: >100 ug/dL

## 2016-04-25 MED ORDER — SODIUM CHLORIDE 0.9 % FOR CRRT
INTRAVENOUS_CENTRAL | Status: DC | PRN
  Filled 2016-04-25: qty 1000

## 2016-04-25 MED ORDER — PRISMASOL BGK 4/2.5 32-4-2.5 MEQ/L IV SOLN
INTRAVENOUS | Status: DC
  Administered 2016-04-25 – 2016-04-26 (×8): via INTRAVENOUS_CENTRAL
  Filled 2016-04-25 (×17): qty 5000

## 2016-04-25 MED ORDER — HEPARIN SODIUM (PORCINE) 1000 UNIT/ML DIALYSIS
1000.0000 [IU] | INTRAMUSCULAR | Status: DC | PRN
  Administered 2016-04-26: 2400 [IU] via INTRAVENOUS_CENTRAL
  Filled 2016-04-25 (×2): qty 6
  Filled 2016-04-25: qty 3

## 2016-04-25 MED ORDER — HALOPERIDOL 0.5 MG PO TABS
4.0000 mg | ORAL_TABLET | ORAL | Status: DC | PRN
  Administered 2016-04-26 – 2016-04-27 (×2): 4 mg via ORAL
  Filled 2016-04-25 (×2): qty 8

## 2016-04-25 MED ORDER — PRISMASOL BGK 4/2.5 32-4-2.5 MEQ/L IV SOLN
INTRAVENOUS | Status: DC
  Administered 2016-04-25 – 2016-04-26 (×3): via INTRAVENOUS_CENTRAL
  Filled 2016-04-25 (×6): qty 5000

## 2016-04-25 MED ORDER — PRISMASOL BGK 4/2.5 32-4-2.5 MEQ/L IV SOLN
INTRAVENOUS | Status: DC
  Administered 2016-04-25 – 2016-04-26 (×3): via INTRAVENOUS_CENTRAL
  Filled 2016-04-25 (×7): qty 5000

## 2016-04-25 MED ORDER — RISPERIDONE 2 MG PO TABS
2.0000 mg | ORAL_TABLET | Freq: Two times a day (BID) | ORAL | Status: DC
  Administered 2016-04-25 – 2016-04-26 (×4): 2 mg via ORAL
  Filled 2016-04-25 (×5): qty 1

## 2016-04-25 NOTE — Progress Notes (Signed)
PULMONARY / CRITICAL CARE MEDICINE   Name: Johnny Sims MRN: [id number] DOB: April 06, 1956    ADMISSION DATE:  04/15/2016 CONSULTATION DATE:  04/19/2016  REFERRING MD:  Dr. Thayer Headings  CHIEF COMPLAINT:  AMS now hypotension  HISTORY OF PRESENT ILLNESS:   60 year old male with PMH as below, which is significant for ESRD on HD TTS, DM, COPD, PAF on warfarin, and dCHF. He was recently 10/2 admitted for hypervolemia in the setting of progression of his CKD at which point HD was initiated through newly placed tunneled catheter. Once euvolemic (4 HD treatments) he was discharged 10/7. 10/19 he presented to ED post fall and was found to have INR elevation, but with the exception with some bruising he was otherwise stable and was discharged. 10/21 he again presented to [facility] ED, this time with altered mental status and slurred speech.  There was concern for CVA as he presented with AF-RVR. But INR found to be elevated at 8.  MRI demonstrated tiny R frontal lobe infarct. Not clear whether this is entirely the cause of his altered mental status. There was also concern for sepsis in setting of leukopenia and CXR infiltrate so he was started on cefepime and vancomycin. Initial blood cultures started to grow GNR and tunneled HD cath was pulled 10/22. Turned out to be pseudomonas putida pan-sensitive. Repeat cultures on 10/23 with MRSA. ABX remain Vancomycin and Zosyn. TTE did not describe vegetation. 10/24 he became hypotensive and airway protection worsened. PCCM asked to see.   SUBJECTIVE:  Levo up to 5, no events overnight. Severe agitation at times.  VITAL SIGNS: BP (!) 87/50   Pulse 61   Temp 97.4 F (36.3 C) (Oral) Comment: warm blanket given  Resp 20   Ht 6\' 2"  (1.88 m)   Wt 254 lb 6.6 oz (115.4 kg)   SpO2 100%   BMI 32.66 kg/m   HEMODYNAMICS: CVP:  [11 mmHg-12 mmHg] 12 mmHg  VENTILATOR SETTINGS: Vent Mode: PRVC FiO2 (%):  [40 %-50 %] 50 % Set Rate:  [22 bmp] 22 bmp Vt Set:  [670 mL]  670 mL PEEP:  [5 cmH20] 5 cmH20 Plateau Pressure:  [21 cmH20-33 cmH20] 22 cmH20  INTAKE / OUTPUT: I/O last 3 completed shifts: In: 5553.4 [I.V.:2619.9; NG/GT:2430; IV Piggyback:503.5] Out: [id number] [Urine:200; [id number]  PHYSICAL EXAMINATION: General:  Chronically ill appearing male, ETT in place.  Agitated and not purposeful. Neuro:  Opens eyes, following some commands (tracks) HEENT:  [place]/AT, PERRL Cardiovascular: Irregularly irregular, 3/6 systolic murmur. 3+ LE to past knees Lungs:  Coarse breath sounds and referred upper airway sounds Abdomen:  Soft non-tender Musculoskeletal: No acute deformity Skin:  Grossly intact. PVD changes to LE. Oozing central line of left neck.   LABS:  BMET  Recent Labs Lab 04/24/16 0444 04/24/16 1649 04/25/16 0440  NA 136 135 136  136  K 5.0 4.7 5.0  5.0  CL 104 103 104  104  CO2 25 25 24  25   BUN 43* 42* 41*  40*  CREATININE 1.46* 1.44* 1.37*  1.36*  GLUCOSE 275* 199* 197*  197*   Electrolytes  Recent Labs Lab 04/23/16 0452  04/24/16 0444 04/24/16 1649 04/25/16 0440  CALCIUM  --   < > 8.5* 8.8* 8.8*  8.8*  MG 2.4  --  2.4  --  2.5*  PHOS  --   < > 2.2* 2.3* 2.9  2.8  < > = values in this interval not displayed.  CBC  Recent  Labs Lab 04/23/16 0452 04/24/16 0444 04/25/16 0440  WBC 13.0* 12.4* 15.9*  HGB 8.8* 8.7* 9.0*  HCT 27.9* 27.8* 28.7*  PLT 34* 38* 56*   Coag's  Recent Labs Lab 04/19/16 1038 04/09/2016 0305 04/21/16 0340 04/22/16 0422  APTT 51*  --  42*  --   INR 2.84 1.70 2.17 2.33   Sepsis Markers  Recent Labs Lab 04/19/16 1655 04/19/16 1933  LATICACIDVEN 1.5 1.3   ABG  Recent Labs Lab 04/23/16 0420 04/24/16 0425 04/25/16 0453  PHART 7.327* 7.361 7.321*  PCO2ART 51.3* 46.5 52.0*  PO2ART 135* 86.2 134*   Liver Enzymes  Recent Labs Lab 04/19/16 1038  04/23/16 1558 04/24/16 1649 04/25/16 0440  AST 47*  --   --   --   --   ALT 47  --   --   --   --   ALKPHOS 101  --   --   --    --   BILITOT 1.6*  --   --   --   --   ALBUMIN 1.8*  < > 1.7* 1.8* 1.9*  < > = values in this interval not displayed.  Cardiac Enzymes  Recent Labs Lab 04/19/16 1655 04/19/16 2018 04/15/2016 0305  TROPONINI 0.71* 1.54* 0.28*   Glucose  Recent Labs Lab 04/24/16 0804 04/24/16 1158 04/24/16 1658 04/24/16 1955 04/24/16 2347 04/25/16 0444  GLUCAP 206* 221* 191* 237* 225* 197*   Imaging Dg Chest Port 1 View  Result Date: 04/25/2016 CLINICAL DATA:  Shortness of breath. EXAM: PORTABLE CHEST 1 VIEW COMPARISON:  04/24/2016. FINDINGS: Endotracheal tube, left IJ line, right IJ line, NG tube in stable position. Mediastinum hilar structures are unremarkable. Low lung volumes with persistent basilar atelectasis. Persistent mild left perihilar infiltrate and/or asymmetric edema. Stable cardiomegaly. No pleural effusion or pneumothorax. IMPRESSION: 1. Lines and tubes in stable position. 2. Persistent low lung volumes with basilar atelectasis persistent mild left perihilar infiltrate and/or asymmetric edema. Electronically Signed   By: [place]   On: 04/25/2016 07:02   STUDIES:  Ct head 10/21 > RIGHT facial contusion. No acute intracranial process. Mild chronic small vessel ischemic Disease. 19 mm LEFT thyroid nodule for which follow up thyroid sonogram is recommended on a nonemergent basis. ECHO 10/23 > LVEF 50-55%; mild LVH, R atrium mildly dilated, mild aortic regurg, mod-severe mitral regurg, PA pressure 45  CULTURES: BCx2 10/21 > Pseudomonas putida BCx2 10/23 > Methicillin resistant Staph Catheter tip 10/22 > no growth 10/30 BC>>>  ANTIBIOTICS: Zosyn 10/21 >>> Vancomycin 10/21 >>>  SIGNIFICANT EVENTS: Remains in shock  LINES/TUBES: ETT 10/24 > L IJ CVC 10/24 >  PIVs x2 HD cath RIJ 10/25 >  DISCUSSION: 60 year old male with newly converted ESRD. Admitted 10/21 with AMS and found to have small CVA and now bacteremia likely from tunneled HD cath. On broad spectrum  antibiotics, but clinical status deteriorated. 10/24 transferred to ICU for intubation, pressors. Now with suspected aspiration PNA. HD cath replaced 10/25 and CRRT initiated. Anticipate initiating comfort care and terminal extubation 10/30 if cannot be extubated.   ASSESSMENT / PLAN:  PULMONARY A: Acute hypoxemic respiratory failure secondary to aspiration PNA COPD without acute exacerbation Respiratory acidosis P:   Full ventilator support ABG noted, rate 24 VAP bundle No trach/peg per family's request SBt if able to reduce sedation  Meds, need WUA  CARDIOVASCULAR  A:  Shock - likely septic vs cardiogenic with severe MR PAF on warfarin and amiodarone gtt H/o hypotension, HLD  R/o rel AI P:  Telemetry monitoring MAP goal >28mmHg KVO IVF as he appears volume overloaded Levophed drip with titration. TEE pending - not sure if will be necessary, considering comfort care. Holding home antihypertensives - start back metoprolol when off pressor for afib. Continue amiodarone gtt cortisol  RENAL A:   ESRD on HD TTS Net negative over 9 L P:   Nephrology following, appreciate recommendations CRRT negative balance BMET in AM Replace electrolytes as indicated.  GASTROINTESTINAL A:   Hepatitis C GERD diahea P:   NPO Intermittent LFTs  TFs Protonix daily for SUP Hold mirlax  HEMATOLOGIC A:   Thrombocytopenia, severe --> stable (sepsis) Warfarin coagulopathy Anemia  P:  Holding coumadin and anticoagulation altogether. Trend CBC and coags Transfuse for Hgb < 7 Thrombocytopenia  INFECTIOUS A:   Bacteremia MRSA and Pseudomonas Putida - source possible LE cellulitis Likely aspiration PNA Need to rule out infective endocarditis  P:   Continue Zosyn and Vancomycin (Day 10) Continue to follow cultures TEE when more stable- if family wishes BC repeat  ENDOCRINE A:   DM - Last A1c 6.9 on 04/17/16 Hypothyroid with low TSH CBGs in 200s  P:   CBG monitoring  and SSI Has TF coverage Will add SSI coverage Lantus increased to 25 daily  NEUROLOGIC A:   Acute metabolic encephalopathy CVA Bipolar disorder P:   RASS goal: 0 to -1 Fentanyl/versed Stroke team following Unable to reduce sedation, need to add additional agents  FAMILY  - Updates: Dr. Nelda Marseille spoke with brother and neighbor (only family the patient has as per them) on 10/28.  After discussion, they understand patient has no reasonable chance at full recovery.  They only want him comfortable.  Will make DNR now and once extubated then no intention to reintubate.  If unable to extubate by 10/30 then will proceed with comfort care.  Olene Floss, MD [place], PGY-2  04/25/2016 7:12 AM   STAFF NOTE: Linwood Dibbles, MD FACP have personally reviewed patient's available data, including medical history, events of note, physical examination and test results as part of my evaluation. I have discussed with resident/NP and other care providers such as pharmacist, RN and RRT. In addition, I personally evaluated patient and elicited key findings of: sedated, rass -2, not FC, remains on low dose levophed, cvvhd neg almost 5 liters noted, would accept MAP 55 with ESRD and MR on echo, hope we can wean levophed off,  If unable add vaso, cortisol level needed, had loose stools need to hold miralax first if remains then would add cdiff, I dont think TEE will change outcome at all, can repeat bc, abx per ID, will need to add Risperdal to lower other sedation needs, may need Depakote,  Assess qtc, add haldol if less 550, need to talk to family about wishes The patient is critically ill with multiple organ systems failure and requires high complexity decision making for assessment and support, frequent evaluation and titration of therapies, application of advanced monitoring technologies and extensive interpretation of multiple databases.   Critical Care Time devoted to patient  care services described in this note is35 Minutes. This time reflects time of care of this signee: Merrie Roof, MD FACP. This critical care time does not reflect procedure time, or teaching time or supervisory time of PA/NP/Med student/Med Resident etc but could involve care discussion time. Rest per NP/medical resident whose note is outlined above and that I agree with   Lavon Paganini. Titus Mould, MD,  FACP Pgr: [id number] [place] [facility] 04/25/2016 9:19 AM

## 2016-04-25 NOTE — Progress Notes (Signed)
Patient's brother Leory Plowman visiting at bedside. Provided update on today's plans. He continues to wish for his brother to be comfortable and does not feel there is anything else that would help medically for him to return to a decent QOL. If patient does not make progress throughout the week, brother still would like terminal extubation with comfort care. He requests that this would not happen until Wednesday morning. He does not want to be present when extubation happens. Will continue to update brother daily.   Olene Floss, MD [place], PGY-2

## 2016-04-25 NOTE — Progress Notes (Addendum)
Pharmacy Antibiotic Note  Johnny Sims is a 60 y.o. male admitted on 04/13/2016 with pan sensitive pseudomonas bacteremia. Currently day 10 of antibiotics total which were broadened from cefepime to vancomycin and Zosyn on 10/24 due to clinical worsening and concern over lower extremity necrotic wounds. Patient was transitioned to CRRT on 10/25 and doses were adjusted. Vancomycin currently therapeutic with a trough of 19. Of note, patient also has 1 of 2 blood cultures positive for CoNS, likely a contaminate but per MD we will continue to treat for now and obtain repeat blood cultures.    Plan: -Continue Zosyn 3.375g q6hr -Continue vancomycin 1.25g IV q24h (goal trough 15-20) -Monitor clinical picture, culture data, length of therapy, vancomycin trough as indicated   Height: 6\' 2"  (188 cm) Weight: 254 lb 6.6 oz (115.4 kg) IBW/kg (Calculated) : 82.2  Temp (24hrs), Avg:97.4 F (36.3 C), Min:96.4 F (35.8 C), Max:98.1 F (36.7 C)   Recent Labs Lab 04/19/16 1655  04/19/16 1933  04/21/16 0340  04/22/16 0422  04/23/16 0451 04/23/16 0452 04/23/16 1558 04/24/16 0444 04/24/16 1649 04/25/16 0440 04/25/16 1330  WBC 18.8*  --   --   < > 18.6*  --  13.3*  --   --  13.0*  --  12.4*  --  15.9*  --   CREATININE 5.65*  < >  --   < > 4.27*  < >  --   < > 1.68*  --  1.48* 1.46* 1.44* 1.37*  1.36*  --   LATICACIDVEN 1.5  --  1.3  --   --   --   --   --   --   --   --   --   --   --   --   VANCOTROUGH  --   --   --   --   --   --   --   --   --   --   --   --   --   --  19  < > = values in this interval not displayed.  Estimated Creatinine Clearance: 77.5 mL/min (by C-G formula based on SCr of 1.37 mg/dL (H)).    Antimicrobials this admission:  10/21 Cefepime >>10/24 10/21 Vancomycin>>10/22; 10/25 >> 10/24 Zosyn >>  Dose adjustments this admission:  10/25: Vancomycin and Zosyn adjusted for CRRT 10/30: Vancomycin trough = 19  Microbiology results:  10/21BCx: Pseudomonas (pan  sens) 10/21 MRSA PCR: neg 10/23 BCx: 1/2 CoNS 10/30 BCx: IP  Thank you for allowing pharmacy to be a part of this patient's care.  Arrie Senate, PharmD PGY-1 Pharmacy Resident Pager: [phone] 04/25/2016

## 2016-04-25 NOTE — Progress Notes (Signed)
Attempted to reach patient's brother Leory Plowman by phone to clarify/confirm GOC but did not answer. Plan to continue antibiotics, repeat blood cultures, obtain cortisol level to see if steroids could be helpful. To wean down on sedation as able; not planning to extubate today.   Olene Floss, MD [place], PGY-2

## 2016-04-25 NOTE — Progress Notes (Addendum)
[facility] Progress Note   Subjective: on vent, another 4 L net neg yest on CRRT, on low dose levo, CXR improved  Vitals:   04/25/16 0700 04/25/16 0739 04/25/16 0816 04/25/16 0950  BP: (!) 87/50     Pulse: 61  (!) 111   Resp: 20  (!) 22   Temp:  97.2 F (36.2 C)    TempSrc:  Axillary    SpO2: 100%  100% 100%  Weight:      Height:        Inpatient medications: . sodium chloride   Intravenous Once  . calcitRIOL  0.5 mcg Oral Q T,Th,Sa-HD  . calcium acetate  2,001 mg Oral TID WC  . chlorhexidine gluconate (MEDLINE KIT)  15 mL Mouth Rinse BID  . darbepoetin (ARANESP) injection - DIALYSIS  200 mcg Intravenous Q Thu-HD  . digoxin  0.25 mg Intravenous Once  . feeding supplement (PRO-STAT SUGAR FREE 64)  30 mL Per Tube TID  . feeding supplement (VITAL HIGH PROTEIN)  1,000 mL Per Tube Q24H  . ferric gluconate (FERRLECIT/NULECIT) IV  125 mg Intravenous Q T,Th,Sa-HD  . heparin  40 Units/kg Dialysis Once in dialysis  . insulin aspart  0-15 Units Subcutaneous Q4H  . insulin glargine  25 Units Subcutaneous Daily  . levothyroxine  88 mcg Oral QAC breakfast  . mouth rinse  15 mL Mouth Rinse QID  . multivitamin  1 tablet Oral QHS  . pantoprazole sodium  40 mg Per Tube Daily  . piperacillin-tazobactam  3.375 g Intravenous Q6H  . risperiDONE  2 mg Oral BID  . sodium chloride  250 mL Intravenous Once  . sodium chloride flush  3 mL Intravenous Q12H  . tiotropium  18 mcg Inhalation Daily  . vancomycin  1,250 mg Intravenous Q24H   . amiodarone 30 mg/hr (04/25/16 0405)  . fentaNYL infusion INTRAVENOUS 400 mcg/hr (04/25/16 0837)  . midazolam (VERSED) infusion 8 mg/hr (04/25/16 0308)  . norepinephrine (LEVOPHED) Adult infusion 5.013 mcg/min (04/24/16 1700)  . dialysis replacement fluid (prismasate) 800 mL/hr at 04/25/16 0837  . dialysis replacement fluid (prismasate) 700 mL/hr at 04/25/16 0208  . dialysate (PRISMASATE) 2,000 mL/hr at 04/25/16 0930   sodium chloride, sodium  chloride, alteplase, fentaNYL, haloperidol, heparin, heparin, ipratropium-albuterol, lidocaine (PF), lidocaine-prilocaine, midazolam, oxyCODONE, pentafluoroprop-tetrafluoroeth, sodium chloride  Exam: ON vent, moving ext x 4, sedated No jvd R IJ temp HD cath RRR no RG Abd obese nondist dec'd BS Ext 2+ hip edema, stasis changes below the knees Neuro moving all ext LUA AVF +bruit  Dialysis: [facility] TTS   4h 38mn  127kg   2/2 bath  Hep 2700  R IJ cath (now removed)/ maturing LUA AVF (10/3 created) Calcitriol 0.5 mcg PO /HD Aranesp 80  Mcg  IV/HD       Venofer 10 mg q hd (start 10/19 >05/05/16)  Other op labs=  hgb 9.0  04/06/16 , cq 8.4  Phos 8.7   pth 516     Assessment: 1. ESRD vol xs, tolerating UF, down 15kg and below dry wt 12 kg now 2. VDRF per CCM 3. Afib rvr 4. CVA, acute 5. Bipolar 6. Sepsis pseudo/ coag neg staph, on AB, tunneled HD cath dc'd  Plan - plan dc CRRT and transition to intermitt HD   RKelly SplinterMD CBrendapager [phone]  04/25/2016, 10:38 AM    Recent Labs Lab 04/24/16 0444 04/24/16 1649 04/25/16 0440  NA 136 135 136  136  K  5.0 4.7 5.0  5.0  CL 104 103 104  104  CO2 _0 GLUCOSE 275* 199* 197*  197*  BUN 43* 42* 41*  40*  CREATININE 1.46* 1.44* 1.37*  1.36*  CALCIUM 8.5* 8.8* 8.8*  8.8*  PHOS 2.2* 2.3* 2.9  2.8    Recent Labs Lab 04/19/16 1038  04/23/16 1558 04/24/16 1649 04/25/16 0440  AST 47*  --   --   --   --   ALT 47  --   --   --   --   ALKPHOS 101  --   --   --   --   BILITOT 1.6*  --   --   --   --   PROT 5.2*  --   --   --   --   ALBUMIN 1.8*  < > 1.7* 1.8* 1.9*  < > = values in this interval not displayed.  Recent Labs Lab 04/23/16 0452 04/24/16 0444 04/25/16 0440  WBC 13.0* 12.4* 15.9*  HGB 8.8* 8.7* 9.0*  HCT 27.9* 27.8* 28.7*  MCV 90.9 93.3 92.6  PLT 34* 38* 56*   Iron/TIBC/Ferritin/ %Sat    Component Value Date/Time   IRON 32 (L) 03/27/2016 1230   TIBC 332  03/27/2016 1230   IRONPCTSAT 10 (L) 03/27/2016 1230

## 2016-04-25 NOTE — Progress Notes (Signed)
Inpatient Diabetes Program Recommendations  AACE/ADA: New Consensus Statement on Inpatient Glycemic Control (2015)  Target Ranges:  Prepandial:   less than 140 mg/dL      Peak postprandial:   less than 180 mg/dL (1-2 hours)      Critically ill patients:  140 - 180 mg/dL   Lab Results  Component Value Date   GLUCAP 215 (H) 04/25/2016   HGBA1C 6.9 (H) 04/17/2016    Review of Glycemic Control  Diabetes history: DM 2 Outpatient Diabetes medications: Lantus 30 units QHS Current orders for Inpatient glycemic control: Lantus 25 units, Novolog Moderate Q4hrs.  Inpatient Diabetes Program Recommendations:   Glucose trends elevated. Patient receiving Vital HP at 65 ml/hr. Please consider Tube Feed Coverage Novolog 4 units Q 4hours.  Thanks,  Tama Headings RN, MSN, [facility] Inpatient Diabetes Coordinator Team Pager [phone] (8a-5p)

## 2016-04-26 DIAGNOSIS — W19XXXA Unspecified fall, initial encounter: Secondary | ICD-10-CM

## 2016-04-26 LAB — GLUCOSE, CAPILLARY
GLUCOSE-CAPILLARY: 217 mg/dL — AB (ref 65–99)
GLUCOSE-CAPILLARY: 247 mg/dL — AB (ref 65–99)
GLUCOSE-CAPILLARY: 250 mg/dL — AB (ref 65–99)
GLUCOSE-CAPILLARY: 251 mg/dL — AB (ref 65–99)
GLUCOSE-CAPILLARY: 255 mg/dL — AB (ref 65–99)
Glucose-Capillary: 229 mg/dL — ABNORMAL HIGH (ref 65–99)
Glucose-Capillary: 257 mg/dL — ABNORMAL HIGH (ref 65–99)

## 2016-04-26 LAB — BLOOD GAS, ARTERIAL
ACID-BASE EXCESS: 0.8 mmol/L (ref 0.0–2.0)
Bicarbonate: 25.4 mmol/L (ref 20.0–28.0)
DRAWN BY: 252031
FIO2: 40
MECHVT: 670 mL
O2 SAT: 97.5 %
PEEP/CPAP: 5 cmH2O
PH ART: 7.375 (ref 7.350–7.450)
PO2 ART: 100 mmHg (ref 83.0–108.0)
Patient temperature: 98.6
RATE: 24 resp/min
pCO2 arterial: 44.5 mmHg (ref 32.0–48.0)

## 2016-04-26 LAB — RENAL FUNCTION PANEL
ALBUMIN: 1.9 g/dL — AB (ref 3.5–5.0)
ANION GAP: 7 (ref 5–15)
BUN: 39 mg/dL — ABNORMAL HIGH (ref 6–20)
CO2: 26 mmol/L (ref 22–32)
Calcium: 8.7 mg/dL — ABNORMAL LOW (ref 8.9–10.3)
Chloride: 104 mmol/L (ref 101–111)
Creatinine, Ser: 1.15 mg/dL (ref 0.61–1.24)
GFR calc Af Amer: >60 mL/min (ref >60)
Glucose, Bld: 248 mg/dL — ABNORMAL HIGH (ref 65–99)
PHOSPHORUS: 2.1 mg/dL — AB (ref 2.5–4.6)
POTASSIUM: 4.5 mmol/L (ref 3.5–5.1)
Sodium: 137 mmol/L (ref 135–145)

## 2016-04-26 LAB — CBC
HEMATOCRIT: 28.8 % — AB (ref 39.0–52.0)
Hemoglobin: 8.9 g/dL — ABNORMAL LOW (ref 13.0–17.0)
MCH: 29.3 pg (ref 26.0–34.0)
MCHC: 30.9 g/dL (ref 30.0–36.0)
MCV: 94.7 fL (ref 78.0–100.0)
PLATELETS: 43 10*3/uL — AB (ref 150–400)
RBC: 3.04 MIL/uL — ABNORMAL LOW (ref 4.22–5.81)
RDW: 19.7 % — ABNORMAL HIGH (ref 11.5–15.5)
WBC: 13.8 10*3/uL — AB (ref 4.0–10.5)

## 2016-04-26 LAB — MAGNESIUM: Magnesium: 2.6 mg/dL — ABNORMAL HIGH (ref 1.7–2.4)

## 2016-04-26 MED ORDER — INSULIN ASPART 100 UNIT/ML ~~LOC~~ SOLN
3.0000 [IU] | SUBCUTANEOUS | Status: DC
  Administered 2016-04-26 – 2016-04-27 (×7): 3 [IU] via SUBCUTANEOUS

## 2016-04-26 MED ORDER — WHITE PETROLATUM GEL
Status: AC
  Administered 2016-04-26: 10:00:00
  Filled 2016-04-26: qty 1

## 2016-04-26 MED ORDER — SODIUM CHLORIDE 0.9 % IV BOLUS (SEPSIS)
500.0000 mL | Freq: Once | INTRAVENOUS | Status: AC
  Administered 2016-04-26: 500 mL via INTRAVENOUS

## 2016-04-26 MED ORDER — INSULIN GLARGINE 100 UNIT/ML ~~LOC~~ SOLN: 30.0000 [IU] | Freq: Every day | SUBCUTANEOUS | Status: DC

## 2016-04-26 MED ORDER — INSULIN GLARGINE 100 UNIT/ML ~~LOC~~ SOLN
30.0000 [IU] | Freq: Every day | SUBCUTANEOUS | Status: DC
  Filled 2016-04-26: qty 0.3

## 2016-04-26 MED ORDER — PIPERACILLIN-TAZOBACTAM 3.375 G IVPB
3.3750 g | Freq: Two times a day (BID) | INTRAVENOUS | Status: DC
  Administered 2016-04-27: 3.375 g via INTRAVENOUS
  Filled 2016-04-26 (×2): qty 50

## 2016-04-26 MED ORDER — INSULIN GLARGINE 100 UNIT/ML ~~LOC~~ SOLN
5.0000 [IU] | Freq: Once | SUBCUTANEOUS | Status: AC
  Administered 2016-04-26: 5 [IU] via SUBCUTANEOUS
  Filled 2016-04-26: qty 0.05

## 2016-04-26 NOTE — Progress Notes (Signed)
Nutrition Follow-up  DOCUMENTATION CODES:   Obesity unspecified  INTERVENTION:    Continue Vital High Protein at goal rate of 65 ml/h (1560 ml per day) and Prostat 30 ml TID to provide 1860 kcals, 182 gm protein, 1304 ml free water daily.  Consider d/c TF if plan of care changes to comfort.  NUTRITION DIAGNOSIS:   Inadequate oral intake related to inability to eat as evidenced by NPO status.  Ongoing  GOAL:   Provide needs based on ASPEN/SCCM guidelines  Met  MONITOR:   Vent status, Labs, TF tolerance, Skin, I & O's  ASSESSMENT:   60 year old male with newly converted ESRD. Admitted 10/21 with AMS and found to have small CVA and now bacteremia likely from tunneled HD cath. On broad spectrum antibiotics, but clinical status deteriorated. 10/24 transferred to ICU for intubation, pressors.   Discussed patient with RN today. He is tolerating TF well. No longer on CRRT, has transitioned to IHD. Plans for no further excalation of care. May terminally extubate tomorrow AM and transition to comfort care if patient fails extubation pending discussion with patient's brother. Patient remains on ventilator support MV: 10.7 L/min Temp (24hrs), Avg:96.2 F (35.7 C), Min:94.2 F (34.6 C), Max:98.1 F (36.7 C)  Labs reviewed: phosphorus is low, potassium WNL Medications reviewed and include Phoslo, Renavit.  Diet Order:  Diet NPO time specified  Skin:  Wound (see comment) (DTI to sacrum & heel; stage II to buttocks)  Last BM:  10/31  Height:   Ht Readings from Last 1 Encounters:  04/14/2016 '6\' 2"'$  (1.88 m)    Weight:   Wt Readings from Last 1 Encounters:  04/26/16 248 lb 0.3 oz (112.5 kg)    Ideal Body Weight:  86.4 kg  BMI:  Body mass index is 31.84 kg/m.  Estimated Nutritional Needs:   Kcal:  1188-6773  Protein:  172 gm  Fluid:  2 L  EDUCATION NEEDS:   No education needs identified at this time  Molli Barrows, [place], [place], [place] Pager [phone] After  Hours Pager [phone]

## 2016-04-26 NOTE — Progress Notes (Signed)
[facility] Progress Note   Subjective: on vent, not responding, 5 L net neg yest, pressors ^'d Vitals:   04/26/16 1030 04/26/16 1045 04/26/16 1100 04/26/16 1115  BP: 117/60 99/64 (!) 111/48 124/75  Pulse: 70 (!) 46 81 (!) 102  Resp: '16 16 16 13  '$ Temp:      TempSrc:      SpO2: 100% 99% 100% 100%  Weight:      Height:        Inpatient medications: . sodium chloride   Intravenous Once  . calcitRIOL  0.5 mcg Oral Q T,Th,Sa-HD  . calcium acetate  2,001 mg Oral TID WC  . chlorhexidine gluconate (MEDLINE KIT)  15 mL Mouth Rinse BID  . darbepoetin (ARANESP) injection - DIALYSIS  200 mcg Intravenous Q Thu-HD  . feeding supplement (PRO-STAT SUGAR FREE 64)  30 mL Per Tube TID  . feeding supplement (VITAL HIGH PROTEIN)  1,000 mL Per Tube Q24H  . ferric gluconate (FERRLECIT/NULECIT) IV  125 mg Intravenous Q T,Th,Sa-HD  . insulin aspart  0-15 Units Subcutaneous Q4H  . insulin aspart  3 Units Subcutaneous Q4H  . [START ON May 15, 2016] insulin glargine  30 Units Subcutaneous Daily  . levothyroxine  88 mcg Oral QAC breakfast  . mouth rinse  15 mL Mouth Rinse QID  . multivitamin  1 tablet Oral QHS  . pantoprazole sodium  40 mg Per Tube Daily  . piperacillin-tazobactam  3.375 g Intravenous Q6H  . risperiDONE  2 mg Oral BID  . sodium chloride  250 mL Intravenous Once  . sodium chloride flush  3 mL Intravenous Q12H  . tiotropium  18 mcg Inhalation Daily  . vancomycin  1,250 mg Intravenous Q24H   . amiodarone 30 mg/hr (04/26/16 0900)  . fentaNYL infusion INTRAVENOUS 200 mcg/hr (04/26/16 0900)  . midazolam (VERSED) infusion 8 mg/hr (04/26/16 0541)  . norepinephrine (LEVOPHED) Adult infusion 10 mcg/min (04/26/16 1100)  . dialysis replacement fluid (prismasate) 800 mL/hr at 04/26/16 0511  . dialysis replacement fluid (prismasate) 700 mL/hr at 04/26/16 0933  . dialysate (PRISMASATE) 2,000 mL/hr at 04/26/16 0933   fentaNYL, haloperidol, heparin, ipratropium-albuterol, midazolam,  oxyCODONE, sodium chloride  Exam: ON vent, sedated No jvd R IJ temp HD cath RRR no RG Abd obese nondist dec'd BS Ext 2+ dependent edema, stasis changes below the knees Neuro not responding LUA AVF +bruit  Dialysis: [facility] TTS   4h 21mn  127kg   2/2 bath  Hep 2700  R IJ cath (now removed)/ maturing LUA AVF (10/3 created) Calcitriol 0.5 mcg PO /HD Aranesp 80  Mcg  IV/HD       Venofer 10 mg q hd (start 10/19 >05/05/16)  Other op labs=  hgb 9.0  04/06/16 , cq 8.4  Phos 8.7   pth 516     Assessment: 1. ESRD on CRRT 2. Hypotension - pressors ^'ing 3. VDRF - on vent per CCM 4. Vol xs/pulm edema - resolved 5. Afib rvr 6. CVA, acute 7. Bipolar 8. Sepsis pseudo/ coag neg staph, on AB, tunneled HD cath dc'd  Plan - no further CRRT.  Plan intermittent HD but will hold off until final decision made about extubation/ comfort care   RKelly SplinterMD CRavennapager [phone]  04/26/2016, 11:38 AM    Recent Labs Lab 04/25/16 0440 04/25/16 1746 04/26/16 0340  NA 136  136 137 137  K 5.0  5.0 4.1 4.5  CL 104  104 105 104  CO2 24  $'25 25 26  'G$ GLUCOSE 197*  197* 285* 248*  BUN 41*  40* 43* 39*  CREATININE 1.37*  1.36* 1.31* 1.15  CALCIUM 8.8*  8.8* 8.5* 8.7*  PHOS 2.9  2.8 2.3* 2.1*    Recent Labs Lab 04/25/16 0440 04/25/16 1746 04/26/16 0340  ALBUMIN 1.9* 1.7* 1.9*    Recent Labs Lab 04/24/16 0444 04/25/16 0440 04/26/16 0340  WBC 12.4* 15.9* 13.8*  HGB 8.7* 9.0* 8.9*  HCT 27.8* 28.7* 28.8*  MCV 93.3 92.6 94.7  PLT 38* 56* 43*   Iron/TIBC/Ferritin/ %Sat    Component Value Date/Time   IRON 32 (L) 03/27/2016 1230   TIBC 332 03/27/2016 1230   IRONPCTSAT 10 (L) 03/27/2016 1230

## 2016-04-26 NOTE — Progress Notes (Signed)
PULMONARY / CRITICAL CARE MEDICINE   Name: Johnny Sims MRN: [id number] DOB: 10/06/1955    ADMISSION DATE:  04/12/2016 CONSULTATION DATE:  04/19/2016  REFERRING MD:  Dr. Thayer Headings  CHIEF COMPLAINT:  AMS now hypotension  HISTORY OF PRESENT ILLNESS:   60 year old male with PMH as below, which is significant for ESRD on HD TTS, DM, COPD, PAF on warfarin, and dCHF. He was recently 10/2 admitted for hypervolemia in the setting of progression of his CKD at which point HD was initiated through newly placed tunneled catheter. Once euvolemic (4 HD treatments) he was discharged 10/7. 10/19 he presented to ED post fall and was found to have INR elevation, but with the exception with some bruising he was otherwise stable and was discharged. 10/21 he again presented to [facility] ED, this time with altered mental status and slurred speech.  There was concern for CVA as he presented with AF-RVR. But INR found to be elevated at 8.  MRI demonstrated tiny R frontal lobe infarct. Not clear whether this is entirely the cause of his altered mental status. There was also concern for sepsis in setting of leukopenia and CXR infiltrate so he was started on cefepime and vancomycin. Initial blood cultures started to grow GNR and tunneled HD cath was pulled 10/22. Turned out to be pseudomonas putida pan-sensitive. Repeat cultures on 10/23 with MR non-SA staph. ABX remain Vancomycin and Zosyn. TTE did not describe vegetation. 10/24 he became hypotensive and airway protection worsened. PCCM asked to see.   SUBJECTIVE:  Low temp to 94.5 F overnight. Levo up to 14 but had been 5 most of last night and has had about 200 mL hour taken with CRRT. Fentanyl halved to 200 this a.m. and versed to 5. Did not receive any prn haldol.   VITAL SIGNS: BP (!) 85/54   Pulse 68   Temp 97.4 F (36.3 C) (Oral)   Resp (!) 24   Ht 6\' 2"  (1.88 m)   Wt 248 lb 0.3 oz (112.5 kg)   SpO2 100%   BMI 31.84 kg/m   HEMODYNAMICS:    VENTILATOR  SETTINGS: Vent Mode: PRVC FiO2 (%):  [40 %-50 %] 40 % Set Rate:  [22 bmp-24 bmp] 24 bmp Vt Set:  [670 mL] 670 mL PEEP:  [5 cmH20] 5 cmH20 Plateau Pressure:  [16 cmH20-28 cmH20] 28 cmH20  INTAKE / OUTPUT: I/O last 3 completed shifts: In: 5199.6 [I.V.:2675.1; NG/GT:1755; IV Piggyback:769.5] Out: [id number]  PHYSICAL EXAMINATION: General:  Chronically ill appearing male, ETT in place. Bearhugger in place.  Neuro:  Eyes open but not tracking or following commands. HEENT:  [place]/AT, PERRL, temporal wasting Cardiovascular: Irregularly irregular, 3/6 systolic murmur. 3+ LE to past knees Lungs:  Coarse breath sounds and referred upper airway sounds Abdomen:  Soft non-tender Musculoskeletal: No acute deformity Skin:  Grossly intact. PVD changes to LE. Oozing central line of left neck.   LABS:  BMET  Recent Labs Lab 04/24/16 1649 04/25/16 0440 04/25/16 1746  NA 135 136  136 137  K 4.7 5.0  5.0 4.1  CL 103 104  104 105  CO2 25 24  25 25   BUN 42* 41*  40* 43*  CREATININE 1.44* 1.37*  1.36* 1.31*  GLUCOSE 199* 197*  197* 285*   Electrolytes  Recent Labs Lab 04/24/16 0444 04/24/16 1649 04/25/16 0440 04/25/16 1746 04/26/16 0340  CALCIUM 8.5* 8.8* 8.8*  8.8* 8.5*  --   MG 2.4  --  2.5*  --  2.6*  PHOS 2.2* 2.3* 2.9  2.8 2.3*  --     CBC  Recent Labs Lab 04/24/16 0444 04/25/16 0440 04/26/16 0340  WBC 12.4* 15.9* 13.8*  HGB 8.7* 9.0* 8.9*  HCT 27.8* 28.7* 28.8*  PLT 38* 56* 43*   Coag's  Recent Labs Lab 04/19/16 1038 03/27/2016 0305 04/21/16 0340 04/22/16 0422  APTT 51*  --  42*  --   INR 2.84 1.70 2.17 2.33   Sepsis Markers  Recent Labs Lab 04/19/16 1655 04/19/16 1933  LATICACIDVEN 1.5 1.3   ABG  Recent Labs Lab 04/24/16 0425 04/25/16 0453 04/26/16 0413  PHART 7.361 7.321* 7.375  PCO2ART 46.5 52.0* 44.5  PO2ART 86.2 134* 100   Liver Enzymes  Recent Labs Lab 04/19/16 1038  04/24/16 1649 04/25/16 0440 04/25/16 1746  AST  47*  --   --   --   --   ALT 47  --   --   --   --   ALKPHOS 101  --   --   --   --   BILITOT 1.6*  --   --   --   --   ALBUMIN 1.8*  < > 1.8* 1.9* 1.7*  < > = values in this interval not displayed.  Cardiac Enzymes  Recent Labs Lab 04/19/16 1655 04/19/16 2018 04/18/2016 0305  TROPONINI 0.71* 1.54* 0.28*   Glucose  Recent Labs Lab 04/25/16 0735 04/25/16 1155 04/25/16 1551 04/25/16 2010 04/26/16 0009 04/26/16 0350  GLUCAP 215* 208* 297* 254* 247* 229*   Imaging No results found. STUDIES:  Ct head 10/21 > RIGHT facial contusion. No acute intracranial process. Mild chronic small vessel ischemic Disease. 19 mm LEFT thyroid nodule for which follow up thyroid sonogram is recommended on a nonemergent basis. ECHO 10/23 > LVEF 50-55%; mild LVH, R atrium mildly dilated, mild aortic regurg, mod-severe mitral regurg, PA pressure 45  CULTURES: BCx2 10/21 > Pseudomonas putida BCx2 10/23 > MR staph (likely contaminant) x 1, other NG x 5 days Catheter tip 10/22 > no growth 10/30 BC>>>  ANTIBIOTICS: Zosyn 10/24 >>> Vancomycin 10/21 >>>  SIGNIFICANT EVENTS: Remains in shock  LINES/TUBES: ETT 10/24 > L IJ CVC 10/24 >  PIVs x2 HD cath RIJ 10/25 >  DISCUSSION: 60 year old male with newly converted ESRD. Admitted 10/21 with AMS and found to have small CVA and now bacteremia likely from tunneled HD cath. On broad spectrum antibiotics, but clinical status deteriorated. 10/24 transferred to ICU for intubation, pressors. Now with suspected aspiration PNA. HD cath replaced 10/25 and CRRT initiated. Anticipate initiating comfort care over the course of this week if no improvement.   ASSESSMENT / PLAN:  PULMONARY A: Acute hypoxemic respiratory failure secondary to aspiration PNA COPD without acute exacerbation Respiratory acidosis - resolved P:   Full ventilator support ABG noted, rate 24 needed VAP bundle No trach/peg per family's request SBT if able to reduce sedation  Meds,  need WUA Wean as goal cpap 5 ps5, goal 1 hr, will then have to discuss DNI status   CARDIOVASCULAR  A:  Shock - likely septic vs cardiogenic with severe MR PAF on warfarin (home) and amiodarone gtt H/o hypotension, HLD Appropriately elevated cortisol P:  Telemetry monitoring MAP goal >60mmHg KVO IVF as he appears volume overloaded Levophed drip with titration. Add vasopressin if unable to wean. - need to lower volume removal TEE pending - not sure if will be necessary, considering comfort care. Holding home antihypertensives - start back metoprolol  when off pressor for afib. Continue amiodarone gtt Cortisol > 100.00 Hold warfarin - obtain intermittent PT/INR QTc 433  RENAL A:   ESRD on HD TTS Net negative over 9 L Neg 11 liters in 48 hours P:   Nephrology following, appreciate recommendations - will discuss lower rate off CRRT negative balance (-13.8) BMET in AM Replace electrolytes as indicated.  GASTROINTESTINAL A:   Hepatitis C GERD Diarrhea continued without miralax P:   NPO Intermittent LFTs  TFs Protonix daily for SUP Obtain C diff if aggressive measures sought  HEMATOLOGIC A:   Thrombocytopenia, severe --> stable (sepsis) Warfarin coagulopathy Anemia  P:  Holding coumadin and anticoagulation altogether. Trend CBC and coags Transfuse for Hgb < 7 Thrombocytopenia  INFECTIOUS A:   Bacteremia MRSE and Pseudomonas Putida - source possible LE cellulitis Likely aspiration PNA Need to rule out infective endocarditis Leukocytosis trending down  P:   Continue Zosyn and Vancomycin (Day 11 abx) Continue to follow cultures TEE when more stable- if family wishes BC repeat 10/30 Obtain C diff panel  ENDOCRINE A:   DM - Last A1c 6.9 on 04/17/16 Hypothyroid with low TSH CBGs in 200s  P:   CBG monitoring and SSI Added TF coverage with 3 U novolog q4h Lantus 25 daily  NEUROLOGIC A:   Acute metabolic encephalopathy CVA Bipolar disorder P:    RASS goal: -1 to -2 Risperdal 2 mg BID Haldol  4 mg q4h prn (has not received) - ensure scheduled Fentanyl/versed QTc not prolonged  Stroke team following If unable to reduce sedation, may need more scheduled medications  FAMILY  - Updates: Spoke with patient's brother yesterday, 10/30. Still wish for comfort care and terminal extubation if makes no progress.   Olene Floss, MD [place], PGY-2  04/26/2016 6:48 AM   STAFF NOTE: Linwood Dibbles, MD FACP have personally reviewed patient's available data, including medical history, events of note, physical examination and test results as part of my evaluation. I have discussed with resident/NP and other care providers such as pharmacist, RN and RRT. In addition, I personally evaluated patient and elicited key findings of:  Awake, does not follow commands, no new pcxr, levophed increased from 5 to 14 mic with 11 liter neg in 48 hrs, need to slow removal, bolus 500 cc  And assess response, stools noted still but comfort likely , NO role cdiff assay, wean topday cpap 5 ps5, goal 1 hr  , will call brother and consider extubation in am for comfort if wean today looks bad vs extubation in hopes for success and comfort if fails, schedule haldol, hopes can reduce versed, increase lantus, hold T  5 am and lantus for extubation, ABX to remain as we re assess [facility] 10/30 The patient is critically ill with multiple organ systems failure and requires high complexity decision making for assessment and support, frequent evaluation and titration of therapies, application of advanced monitoring technologies and extensive interpretation of multiple databases.   Critical Care Time devoted to patient care services described in this note is35 Minutes. This time reflects time of care of this signee: Merrie Roof, MD FACP. This critical care time does not reflect procedure time, or teaching time or supervisory time of PA/NP/Med student/Med  Resident etc but could involve care discussion time. Rest per NP/medical resident whose note is outlined above and that I agree with   Lavon Paganini. Titus Mould, MD, [place] Pgr: [facility] 04/26/2016 9:47 AM

## 2016-04-26 NOTE — Progress Notes (Signed)
Tried to reach patient's brother Johnny Sims. Left a message asking him to call the unit to confirm wishes, as plan is to no longer escalate care, extubate tomorrow morning around 9 am, and to proceed with comfort care if patient fails extubation. This plan is in-line with what brother had expressed as [place] yesterday, 10/30. Johnny Sims had stated that he wanted his brother to be comfortable, did not think additional care would bring him to a point where he could have any QOL, and agreed to extubation at any point after Tuesday (10/31) afternoon. Yesterday, 10/30, Bradley expressed he did not want to be present for extubation; this was confirmed several times.  Olene Floss, MD [place], PGY-2

## 2016-04-26 NOTE — Progress Notes (Addendum)
Pharmacy Antibiotic Note  Johnny Sims is a 60 y.o. male admitted on 04/01/2016 with pan sensitive pseudomonas bacteremia. Currently day 11 of antibiotics total which were broadened from cefepime to vancomycin and Zosyn on 10/24 due to clinical worsening, concern over lower extremity necrotic wounds, and CoNS in blood in setting of recent HD catheter placement. Patient was transitioned to CRRT on 10/25 but now discontinuing over concern for hypovolemia and increasing pressor requirements, per renal will transition to intermittent HD depending on clinical course and goals of care. Continue broad spectrum antibiotics for now, likely to transition to comfort care soon.   Plan: -Change Zosyn to 3.375g q12hr in anticipation of HD dosing -Vancomycin 1.25g IV q24h x1 as patient received CRRT all evening and morning since last dose, may need to dose based on levels after -Obtain BMP tomorrow morning, may need to further adjust antibiotics based on renal function off CRRT, HD plans, and GOC discussions -Monitor clinical picture, culture data, length of therapy, vancomycin trough as indicated   Height: 6\' 2"  (188 cm) Weight: 248 lb 0.3 oz (112.5 kg) IBW/kg (Calculated) : 82.2  Temp (24hrs), Avg:96.2 F (35.7 C), Min:94.2 F (34.6 C), Max:98.1 F (36.7 C)   Recent Labs Lab 04/19/16 1655  04/19/16 1933  04/22/16 0422  04/23/16 0452  04/24/16 0444 04/24/16 1649 04/25/16 0440 04/25/16 1330 04/25/16 1746 04/26/16 0340  WBC 18.8*  --   --   < > 13.3*  --  13.0*  --  12.4*  --  15.9*  --   --  13.8*  CREATININE 5.65*  < >  --   < >  --   < >  --   < > 1.46* 1.44* 1.37*  1.36*  --  1.31* 1.15  LATICACIDVEN 1.5  --  1.3  --   --   --   --   --   --   --   --   --   --   --   VANCOTROUGH  --   --   --   --   --   --   --   --   --   --   --  19  --   --   < > = values in this interval not displayed.  Estimated Creatinine Clearance: 91.1 mL/min (by C-G formula based on SCr of 1.15 mg/dL).     Antimicrobials this admission:  10/21 Cefepime >>10/24 10/21 Vancomycin>>10/22; 10/25 >> 10/24 Zosyn >>  Dose adjustments this admission:  10/25: Vancomycin and Zosyn adjusted for CRRT 10/30: Vancomycin trough = 19  Microbiology results:  10/21BCx: Pseudomonas (pan sens) 10/21 MRSA PCR: neg 10/23 BCx: 1/2 CoNS 10/30 BCx: GPC in clusters  Thank you for allowing pharmacy to be a part of this patient's care.  Arrie Senate, PharmD PGY-1 Pharmacy Resident Pager: [phone] 04/26/2016

## 2016-04-26 NOTE — Progress Notes (Signed)
RT note- post bathing, moderate gasping respiratory pattern, placed back to full support.

## 2016-04-27 LAB — GLUCOSE, CAPILLARY
GLUCOSE-CAPILLARY: 164 mg/dL — AB (ref 65–99)
Glucose-Capillary: 194 mg/dL — ABNORMAL HIGH (ref 65–99)

## 2016-04-27 MED ORDER — FENTANYL 2500MCG IN NS 250ML (10MCG/ML) PREMIX INFUSION: 50.0000 ug/h | INTRAVENOUS | Status: DC

## 2016-04-27 MED ORDER — SODIUM CHLORIDE 0.9 % IV SOLN
1.0000 mg/h | INTRAVENOUS | Status: DC
  Filled 2016-04-27: qty 10

## 2016-04-27 MED ORDER — MIDAZOLAM BOLUS VIA INFUSION (WITHDRAWAL LIFE SUSTAINING TX)
5.0000 mg | INTRAVENOUS | Status: DC | PRN
  Filled 2016-04-27: qty 20

## 2016-04-27 MED ORDER — FENTANYL BOLUS VIA INFUSION
50.0000 ug | INTRAVENOUS | Status: DC | PRN
  Filled 2016-04-27: qty 200

## 2016-04-27 MED ORDER — SODIUM CHLORIDE 0.9 % IV SOLN
1.0000 mg/h | INTRAVENOUS | Status: DC
  Administered 2016-04-27: 8 mg/h via INTRAVENOUS
  Filled 2016-04-27: qty 20

## 2016-04-27 DEATH — deceased

## 2016-04-28 LAB — CULTURE, BLOOD (ROUTINE X 2)

## 2016-04-30 LAB — CULTURE, BLOOD (ROUTINE X 2): CULTURE: NO GROWTH

## 2016-05-06 ENCOUNTER — Ambulatory Visit: Payer: Medicaid Other | Admitting: Vascular Surgery

## 2016-05-06 ENCOUNTER — Ambulatory Visit (HOSPITAL_COMMUNITY): Payer: Medicaid Other

## 2016-05-09 ENCOUNTER — Telehealth: Payer: Self-pay

## 2016-05-09 NOTE — Telephone Encounter (Signed)
On 05/09/2016 I received a death certificate from [employer] (original). The death certificate is for cremation. The patient is a patient of Doctor Ramaswamy. The death certificate will be taken to [facility] (2100) this pm for signature.  On 16-May-2016 I received the death certificate back from Doctor Ramaswamy. I got the death certificate ready and called the funeral home to let them know the death certificate is ready for pickup. I also faxed a copy to the funeral home per the funeral home request.

## 2016-05-27 NOTE — Progress Notes (Signed)
Time of death 12:35 . Pronounced by Loma Newton RN and Earnestine Mealing RN. No respirations nor heart sounds auscultated. Dr Chase Caller informed and on unit.I called and informed patient's brother Janos Melchiorre of time of death .

## 2016-05-27 NOTE — Progress Notes (Addendum)
  [facility] Progress Note   Subjective: getting ready for terminal extubation  Vitals:   05/21/2016 0915 05/21/16 0930 05/21/16 0945 May 21, 2016 1000  BP:  (!) 120/55  129/65  Pulse: (!) 113 (!) 103 (!) 109 (!) 133  Resp: 20 (!) 21 (!) 24 (!) 24  Temp:      TempSrc:      SpO2: 100% 100% 100% 100%  Weight:      Height:        Inpatient medications:   . fentaNYL infusion INTRAVENOUS    . midazolam (VERSED) infusion     fentaNYL, midazolam  Exam: ON vent, sedated No jvd R IJ temp HD cath RRR no RG Abd obese nondist dec'd BS Ext 2+ dependent edema, stasis changes below the knees Neuro not responding LUA AVF +bruit  Dialysis: [facility] TTS   4h 60min  127kg   2/2 bath  Hep 2700  R IJ cath (now removed)/ maturing LUA AVF (10/3 created)      Assessment: 1. ESRD on HD tts 2. Hypotension - on pressors 3. VDRF - on vent per CCM 4. Afib rvr 5. CVA, acute 6. Sepsis pseudo/ coag neg staph: on AB, TDC dc'd  Plan - no further dialysis, for terminal extubation, comfort care, will sign off  Kelly Splinter MD [facility] pager [phone]   May 21, 2016, 11:14 AM    Recent Labs Lab 04/25/16 0440 04/25/16 1746 04/26/16 0340  NA 136  136 137 137  K 5.0  5.0 4.1 4.5  CL 104  104 105 104  CO2 24  25 25 26   GLUCOSE 197*  197* 285* 248*  BUN 41*  40* 43* 39*  CREATININE 1.37*  1.36* 1.31* 1.15  CALCIUM 8.8*  8.8* 8.5* 8.7*  PHOS 2.9  2.8 2.3* 2.1*    Recent Labs Lab 04/25/16 0440 04/25/16 1746 04/26/16 0340  ALBUMIN 1.9* 1.7* 1.9*    Recent Labs Lab 04/24/16 0444 04/25/16 0440 04/26/16 0340  WBC 12.4* 15.9* 13.8*  HGB 8.7* 9.0* 8.9*  HCT 27.8* 28.7* 28.8*  MCV 93.3 92.6 94.7  PLT 38* 56* 43*   Iron/TIBC/Ferritin/ %Sat    Component Value Date/Time   IRON 32 (L) 03/27/2016 1230   TIBC 332 03/27/2016 1230   IRONPCTSAT 10 (L) 03/27/2016 1230

## 2016-05-27 NOTE — Progress Notes (Signed)
PULMONARY / CRITICAL CARE MEDICINE   Name: Johnny Sims MRN: [id number] DOB: 1955/10/03    ADMISSION DATE:  04/18/2016 CONSULTATION DATE:  04/19/2016  REFERRING MD:  Dr. Thayer Headings  CHIEF COMPLAINT:  AMS now hypotension Brief  60 year old male with PMH as below, which is significant for ESRD on HD TTS, DM, COPD, PAF on warfarin, and dCHF. He was recently 10/2 admitted for hypervolemia in the setting of progression of his CKD at which point HD was initiated through newly placed tunneled catheter. Once euvolemic (4 HD treatments) he was discharged 10/7. 10/19 he presented to ED post fall and was found to have INR elevation, but with the exception with some bruising he was otherwise stable and was discharged. 10/21 he again presented to [facility] ED, this time with altered mental status and slurred speech.  There was concern for CVA as he presented with AF-RVR. But INR found to be elevated at 8.  MRI demonstrated tiny R frontal lobe infarct. Not clear whether this is entirely the cause of his altered mental status. There was also concern for sepsis in setting of leukopenia and CXR infiltrate so he was started on cefepime and vancomycin. Initial blood cultures started to grow GNR and tunneled HD cath was pulled 10/22. Turned out to be pseudomonas putida pan-sensitive. Repeat cultures on 10/23 with MR non-SA staph. ABX remain Vancomycin and Zosyn. TTE did not describe vegetation. 10/24 he became hypotensive and airway protection worsened. PCCM asked to see.  STUDIES:  Ct head 10/21 > RIGHT facial contusion. No acute intracranial process. Mild chronic small vessel ischemic Disease. 19 mm LEFT thyroid nodule for which follow up thyroid sonogram is recommended on a nonemergent basis. ECHO 10/23 > LVEF 50-55%; mild LVH, R atrium mildly dilated, mild aortic regurg, mod-severe mitral regurg, PA pressure 45  CULTURES: BCx2 10/21 > Pseudomonas putida BCx2 10/23 > MR staph (likely contaminant) x 1, other NG  x 5 days Catheter tip 10/22 > no growth 10/30 BC>>>  ANTIBIOTICS: Zosyn 10/24 >>> Vancomycin 10/21 >>>  LINES/TUBES: ETT 10/24 > L IJ CVC 10/24 >  PIVs x2 HD cath RIJ 10/25 >  EVENTS 10/31 -Low temp to 94.5 F overnight. Levo up to 14 but had been 5 most of last night and has had about 200 mL hour taken with CRRT. Fentanyl halved to 200 this a.m. and versed to 5. Did not receive any prn haldol.     SUBJECTIVE/OVERNIGHT/INTERVAL HX 2016/05/25 - brother at bedside. Asking for terminal wean May 25, 2016. His friend Tessie Fass at bedside. Lot of questions on life expectancy post terminal wean and ensuring comfort. May 25, 2016  - los 11 days. Stopped crrt yesterday.On levophed. On vent 40% fio2. SEdation with fent/versed gtt and risperdal via tube. PRN haldol via tube  VITAL SIGNS: BP 129/65   Pulse (!) 133   Temp (!) 100.7 F (38.2 C) (Axillary) Comment: Notified RN- Phyllis   Resp (!) 24   Ht 6\' 2"  (1.88 m)   Wt 111.7 kg (246 lb 4.1 oz)   SpO2 100%   BMI 31.62 kg/m   HEMODYNAMICS: CVP:  [0 mmHg-14 mmHg] 14 mmHg  VENTILATOR SETTINGS: Vent Mode: PRVC FiO2 (%):  [40 %] 40 % Set Rate:  [24 bmp] 24 bmp Vt Set:  [670 mL] 670 mL PEEP:  [5 cmH20] 5 cmH20 Pressure Support:  [5 cmH20] 5 cmH20 Plateau Pressure:  [14 cmH20-24 cmH20] 20 cmH20  INTAKE / OUTPUT: I/O last 3 completed shifts: In: 5866.8 [I.V.:2591.8;  FB:724606; IV Piggyback:900] Out: 5093 [Other:5093]  PHYSICAL EXAMINATION: General:  Chronically ill appearing male, ETT in place.   Neuro:  RASS -4 on fent gtt HEENT:  [place]/AT, PERRL, temporal wasting Cardiovascular: Irregularly irregular, 3/6 systolic murmur. 3+ LE to past knees Lungs:  Coarse breath sounds and referred upper airway sounds Abdomen:  Soft non-tender Musculoskeletal: No acute deformity Skin:  Grossly intact. PVD changes to LE. Oozing central line of left neck.   LABS:  PULMONARY  Recent Labs Lab 04/22/16 0231 04/23/16 0420 04/24/16 0425  04/25/16 0453 04/26/16 0413  PHART 7.394 7.327* 7.361 7.321* 7.375  PCO2ART 44.3 51.3* 46.5 52.0* 44.5  PO2ART 131.0* 135* 86.2 134* 100  HCO3 27.2 26.1 25.7 26.1 25.4  TCO2 29  --   --   --   --   O2SAT 99.0 98.8 96.3 98.8 97.5    CBC  Recent Labs Lab 04/24/16 0444 04/25/16 0440 04/26/16 0340  HGB 8.7* 9.0* 8.9*  HCT 27.8* 28.7* 28.8*  WBC 12.4* 15.9* 13.8*  PLT 38* 56* 43*    COAGULATION  Recent Labs Lab 04/21/16 0340 04/22/16 0422  INR 2.17 2.33    CARDIAC  No results for input(s): TROPONINI in the last 168 hours. No results for input(s): PROBNP in the last 168 hours.   CHEMISTRY  Recent Labs Lab 04/22/16 0422  04/23/16 0452  04/24/16 0444 04/24/16 1649 04/25/16 0440 04/25/16 1746 04/26/16 0340  NA  --   < >  --   < > 136 135 136  136 137 137  K  --   < >  --   < > 5.0 4.7 5.0  5.0 4.1 4.5  CL  --   < >  --   < > 104 103 104  104 105 104  CO2  --   < >  --   < > 25 25 24  25 25 26   GLUCOSE  --   < >  --   < > 275* 199* 197*  197* 285* 248*  BUN  --   < >  --   < > 43* 42* 41*  40* 43* 39*  CREATININE  --   < >  --   < > 1.46* 1.44* 1.37*  1.36* 1.31* 1.15  CALCIUM  --   < >  --   < > 8.5* 8.8* 8.8*  8.8* 8.5* 8.7*  MG 2.4  --  2.4  --  2.4  --  2.5*  --  2.6*  PHOS  --   < >  --   < > 2.2* 2.3* 2.9  2.8 2.3* 2.1*  < > = values in this interval not displayed. Estimated Creatinine Clearance: 90.8 mL/min (by C-G formula based on SCr of 1.15 mg/dL).   LIVER  Recent Labs Lab 04/21/16 0340  04/22/16 0422  04/23/16 1558 04/24/16 1649 04/25/16 0440 04/25/16 1746 04/26/16 0340  ALBUMIN 1.6*  < >  --   < > 1.7* 1.8* 1.9* 1.7* 1.9*  INR 2.17  --  2.33  --   --   --   --   --   --   < > = values in this interval not displayed.   INFECTIOUS No results for input(s): LATICACIDVEN, PROCALCITON in the last 168 hours.   ENDOCRINE CBG (last 3)   Recent Labs  04/26/16 2353 05-19-16 0411 05-19-16 0802  GLUCAP 257* 164* 194*          IMAGING x48h  -  image(s) personally visualized  -   highlighted in bold No results found.      DISCUSSION: 60 year old male with newly converted ESRD. Admitted 10/21 with AMS and found to have small CVA and now bacteremia likely from tunneled HD cath. On broad spectrum antibiotics, but clinical status deteriorated. 10/24 transferred to ICU for intubation, pressors. Now with suspected aspiration PNA. HD cath replaced 10/25 and CRRT initiated. Anticipate initiating comfort care over the course of this week if no improvement.   ASSESSMENT / PLAN:  PULMONARY A: Acute hypoxemic respiratory failure secondary to aspiration PNA COPD without acute exacerbation Respiratory acidosis - resolved  CARDIOVASCULAR  A:  Shock - likely septic vs cardiogenic with severe MR PAF on warfarin (home) and amiodarone gtt H/o hypotension, HLD Appropriately elevated cortisol  RENAL A:   ESRD on HD TTS  GASTROINTESTINAL A:   Hepatitis C GERD Diarrhea continued without miralax    HEMATOLOGIC A:   Thrombocytopenia, severe --> stable (sepsis) Warfarin coagulopathy Anemia   INFECTIOUS A:   Bacteremia MRSE and Pseudomonas Putida - source possible LE cellulitis Likely aspiration PNA Need to rule out infective endocarditis Leukocytosis trending down  ENDOCRINE A:   DM - Last A1c 6.9 on 04/17/16 Hypothyroid with low TSH CBGs in 200s    NEUROLOGIC A:   Acute metabolic encephalopathy CVA Bipolar disorder     FAMILY  - Updates: Spoke with patient's brother yesterday, 10/30. Still wish for comfort care and terminal extubation if makes no progress.     Interdisciplinary Goals of Care Family Meeting   Date carried out:: 27-May-2016  Location of the meeting: Bedside  Member's involved: Physician, Bedside Registered Nurse, Family Member or next of kin and Other: brother and his friend Theresa of Tour manager:  brother    Discussion: We discussed goals of care for [employer] .  - comfort care  Code status: Full DNR  Disposition: In-patient comfort care - terminal wean May 27, 2016 - orders wreitting  Time spent for the meeting: 15 min      The patient is critically ill with multiple organ systems failure and requires high complexity decision making for assessment and support, frequent evaluation and titration of therapies, application of advanced monitoring technologies and extensive interpretation of multiple databases.   Critical Care Time devoted to patient care services described in this note is  30  Minutes. This time reflects time of care of this signee Dr Brand Males. This critical care time does not reflect procedure time, or teaching time or supervisory time of PA/NP/Med student/Med Resident etc but could involve care discussion time    Dr. Brand Males, M.D., [facility].C.P Pulmonary and Critical Care Medicine Staff Physician [place] [facility] Pager: [phone], If no answer or between  15:00h - 7:00h: call [phone]  2016-05-27 10:30 AM

## 2016-05-27 NOTE — Care Management Note (Signed)
Case Management Note  Patient Details  Name: Johnny Sims MRN: [id number] Date of Birth: 04/20/1956  Subjective/Objective:     Admitted 10/21 with AMS and found to have small CVA and now bacteremia likely from tunneled HD cath. On CRRT and ventilated  Action/Plan :  PTA from home.  CM will continue to follow for discharge needs   Expected Discharge Date:                  Expected Discharge Plan:     In-House Referral:     Discharge planning Services  CM Consult  Post Acute Care Choice:    Choice offered to:     DME Arranged:    DME Agency:     HH Arranged:    HH Agency:     Status of Service:  In process, will continue to follow  If discussed at Long Length of Stay Meetings, dates discussed:    Additional Comments: May 13, 2016 Pt expired today Maryclare Labrador, RN May 13, 2016, 2:39 PM

## 2016-05-27 NOTE — Discharge Summary (Signed)
DISCHARGE SUMMARY    Date of admit: 04/13/2016  4:27 AM Date of discharge: 05/14/2016  3:39 PM Length of Stay: 11 days  PCP is [place], [place], [place]   PROBLEM LIST Primary Problems:   CVA (cerebral vascular accident) ([place]) - right frontal lobe infract Pseudomonas bacteremia with septic shock ([place]) Acute respiratory failure with hypoxia ([place]) - Endotracheally intubated   Acute encephalopathy  OTher problems    Hyperlipidemia   Essential hypertension   GERD (gastroesophageal reflux disease)   Atrial fibrillation status post cardioversion ([place])   Type 2 diabetes mellitus with diabetic polyneuropathy, with long-term current use of insulin (HCC)   ESRD on dialysis ([place])   Weakness   Slurred speech   Leukopenia   Fall   Pressure injury of skin    Contusion of face     SUMMARY Johnny Sims was 60 y.o. patient with    has a past medical history of Anxiety; Asthma; Bipolar 1 disorder ([place]); Chronic pain; CKD (chronic kidney disease), stage II; COPD (chronic obstructive pulmonary disease) ([place]); Diabetes mellitus; Fibromyalgia; GERD (gastroesophageal reflux disease); Hepatitis C; Hepatitis C; Hyperlipidemia; Hypertension; Hypothyroidism; Melanoma ([place]); OA (osteoarthritis); PAF (paroxysmal atrial fibrillation) ([place]); Pancreatitis, acute (2015); RA (rheumatoid arthritis) ([place]); and Venous stasis.   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   Admitted on 04/26/2016 with  . He was recently 10/2 admitted for hypervolemia in  the setting of progression of his CKD at which point HD was initiated through newly placed tunneled catheter. Once euvolemic (4 HD treatments) he was discharged 10/7. 10/19 he presented to ED post fall and was found to have INR elevation, but with the exception with some bruising he was otherwise stable and was discharged. 10/21 he again presented to [facility] ED, this time with altered mental status and slurred speech.  There was concern for CVA as he presented with AF-RVR. But INR found to be elevated at 8.  MRI demonstrated tiny R frontal lobe infarct. Not clear whether this is entirely the cause of his altered mental status. There was also concern for sepsis in setting of leukopenia and CXR infiltrate so he was started on cefepime and vancomycin. Initial blood cultures started to grow GNR and tunneled HD cath was pulled 10/22. Turned out to be pseudomonas putida pan-sensitive. Repeat cultures on 10/23 with MR non-SA staph. ABX remain Vancomycin and Zosyn. TTE did not describe vegetation. 10/24 he became hypotensive and airway protection worsened. PCCM asked to see. 04/19/16   STUDIES: and events Ct head 10/21 > RIGHT facial contusion. No acute intracranial process. Mild chronic small vessel ischemic MRI 10/21 - Tiny right frontal lobe acute infarct.  BCx2 10/21 > Pseudomonas putida BCx2 10/23 > MR staph (likely contaminant) x 1, other NG x 5 days ECHO 10/23 > LVEF 50-55%; mild LVH, R atrium mildly dilated, mild aortic regurg, mod-severe mitral regurg, PA pressure 45 04/19/16 - PCCM consultation and intubated with HD cath placement  10/31 -Low temp to 94.5 F overnight. Levo up to 14 but had been 5 most of last night and has had about 200 mL hour taken with CRRT. Fentanyl halved to 200 this a.m. and versed to 5. Did not receive any prn haldol.  05-06-2016 - brother at bedside. Asking for terminal wean 05/06/2016. His friend Tessie Fass at bedside. Lot of questions on life expectancy post  terminal wean and ensuring comfort. 05/06/16  - los 11 days. Stopped crrt yesterday.On levophed. On vent 40% fio2. SEdation with fent/versed gtt and risperdal via tube. PRN haldol via tube               SIGNED Dr. Brand Males, M.D., [facility].C.P Pulmonary and Critical Care Medicine Staff Physician [place] [facility] Pager: [phone], If no answer or between  15:00h - 7:00h: call [phone]  05/18/2016 8:21 AM

## 2016-05-27 NOTE — Progress Notes (Signed)
Patient terminally extubated per MD order. No complications. RN at bedside. RT will continue to monitor.

## 2016-05-27 NOTE — Progress Notes (Signed)
   Apr 30, 2016 1120  Clinical Encounter Type  Visited With Patient and family together  Visit Type Other (Comment) ([place] consult)  Referral From Nurse  Consult/Referral To Chaplain  Spiritual Encounters  Spiritual Needs Prayer;Emotional  Stress Factors  Patient Stress Factors Major life changes  Family Stress Factors Family relationships  Offered prayer and emotional support with brother present.

## 2016-05-27 NOTE — Progress Notes (Signed)
Wasted Fentanyl 75 mls and versed 40 mls and witnessed by [employer]

## 2016-05-27 DEATH — deceased
